# Patient Record
Sex: Female | Born: 1955 | Race: Black or African American | Hispanic: No | Marital: Single | Smoking: Never smoker
Health system: Southern US, Community
[De-identification: ages and names within clinical notes are randomized; demographics above are authoritative.]

## PROBLEM LIST (undated history)

## (undated) DIAGNOSIS — D219 Benign neoplasm of connective and other soft tissue, unspecified: Secondary | ICD-10-CM

## (undated) DIAGNOSIS — R161 Splenomegaly, not elsewhere classified: Secondary | ICD-10-CM

## (undated) DIAGNOSIS — E119 Type 2 diabetes mellitus without complications: Secondary | ICD-10-CM

## (undated) DIAGNOSIS — G459 Transient cerebral ischemic attack, unspecified: Secondary | ICD-10-CM

## (undated) DIAGNOSIS — R7611 Nonspecific reaction to tuberculin skin test without active tuberculosis: Secondary | ICD-10-CM

## (undated) DIAGNOSIS — H3581 Retinal edema: Secondary | ICD-10-CM

## (undated) DIAGNOSIS — F329 Major depressive disorder, single episode, unspecified: Secondary | ICD-10-CM

## (undated) DIAGNOSIS — G629 Polyneuropathy, unspecified: Secondary | ICD-10-CM

## (undated) DIAGNOSIS — E669 Obesity, unspecified: Secondary | ICD-10-CM

## (undated) DIAGNOSIS — N946 Dysmenorrhea, unspecified: Secondary | ICD-10-CM

## (undated) DIAGNOSIS — G4730 Sleep apnea, unspecified: Secondary | ICD-10-CM

## (undated) DIAGNOSIS — Z9289 Personal history of other medical treatment: Secondary | ICD-10-CM

## (undated) DIAGNOSIS — N879 Dysplasia of cervix uteri, unspecified: Secondary | ICD-10-CM

## (undated) DIAGNOSIS — J189 Pneumonia, unspecified organism: Secondary | ICD-10-CM

## (undated) DIAGNOSIS — H269 Unspecified cataract: Secondary | ICD-10-CM

## (undated) DIAGNOSIS — E785 Hyperlipidemia, unspecified: Secondary | ICD-10-CM

## (undated) DIAGNOSIS — M8580 Other specified disorders of bone density and structure, unspecified site: Secondary | ICD-10-CM

## (undated) DIAGNOSIS — I10 Essential (primary) hypertension: Secondary | ICD-10-CM

## (undated) DIAGNOSIS — J45901 Unspecified asthma with (acute) exacerbation: Secondary | ICD-10-CM

## (undated) DIAGNOSIS — I639 Cerebral infarction, unspecified: Secondary | ICD-10-CM

## (undated) HISTORY — PX: BREAST SURGERY: SHX581

## (undated) HISTORY — DX: Obesity, unspecified: E66.9

## (undated) HISTORY — DX: Benign neoplasm of connective and other soft tissue, unspecified: D21.9

## (undated) HISTORY — DX: Transient cerebral ischemic attack, unspecified: G45.9

## (undated) HISTORY — PX: BREAST BIOPSY: SHX20

## (undated) HISTORY — DX: Hyperlipidemia, unspecified: E78.5

## (undated) HISTORY — DX: Retinal edema: H35.81

## (undated) HISTORY — PX: COLPOSCOPY: SHX161

## (undated) HISTORY — DX: Other specified disorders of bone density and structure, unspecified site: M85.80

## (undated) HISTORY — DX: Dysmenorrhea, unspecified: N94.6

## (undated) HISTORY — PX: STOMACH SURGERY: SHX791

## (undated) HISTORY — DX: Polyneuropathy, unspecified: G62.9

## (undated) HISTORY — PX: EYE SURGERY: SHX253

## (undated) HISTORY — PX: PELVIC LAPAROSCOPY: SHX162

## (undated) HISTORY — PX: OVARIAN CYST REMOVAL: SHX89

## (undated) HISTORY — DX: Essential (primary) hypertension: I10

## (undated) HISTORY — DX: Major depressive disorder, single episode, unspecified: F32.9

## (undated) HISTORY — DX: Unspecified asthma with (acute) exacerbation: J45.901

## (undated) HISTORY — DX: Splenomegaly, not elsewhere classified: R16.1

## (undated) HISTORY — DX: Type 2 diabetes mellitus without complications: E11.9

## (undated) HISTORY — DX: Dysplasia of cervix uteri, unspecified: N87.9

## (undated) HISTORY — PX: APPENDECTOMY: SHX54

## (undated) HISTORY — PX: MYOMECTOMY: SHX85

## (undated) HISTORY — PX: GYNECOLOGIC CRYOSURGERY: SHX857

## (undated) HISTORY — PX: COLONOSCOPY: SHX174

---
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
## 1998-12-08 ENCOUNTER — Other Ambulatory Visit: Admission: RE | Admit: 1998-12-08 | Discharge: 1998-12-08 | Payer: Self-pay | Admitting: Obstetrics and Gynecology

## 1999-08-10 ENCOUNTER — Encounter: Payer: Self-pay | Admitting: Obstetrics and Gynecology

## 1999-08-10 ENCOUNTER — Encounter: Admission: RE | Admit: 1999-08-10 | Discharge: 1999-08-10 | Payer: Self-pay | Admitting: Obstetrics and Gynecology

## 2000-01-29 ENCOUNTER — Other Ambulatory Visit: Admission: RE | Admit: 2000-01-29 | Discharge: 2000-01-29 | Payer: Self-pay | Admitting: Obstetrics and Gynecology

## 2000-05-02 ENCOUNTER — Ambulatory Visit (HOSPITAL_BASED_OUTPATIENT_CLINIC_OR_DEPARTMENT_OTHER): Admission: RE | Admit: 2000-05-02 | Discharge: 2000-05-02 | Payer: Self-pay | Admitting: Orthopedic Surgery

## 2000-08-22 ENCOUNTER — Emergency Department (HOSPITAL_COMMUNITY): Admission: EM | Admit: 2000-08-22 | Discharge: 2000-08-23 | Payer: Self-pay | Admitting: Emergency Medicine

## 2000-08-22 ENCOUNTER — Encounter: Payer: Self-pay | Admitting: Emergency Medicine

## 2000-09-04 ENCOUNTER — Encounter: Payer: Self-pay | Admitting: Internal Medicine

## 2000-09-04 ENCOUNTER — Encounter: Admission: RE | Admit: 2000-09-04 | Discharge: 2000-09-04 | Payer: Self-pay | Admitting: Internal Medicine

## 2001-02-18 HISTORY — PX: OTHER SURGICAL HISTORY: SHX169

## 2001-02-20 ENCOUNTER — Other Ambulatory Visit: Admission: RE | Admit: 2001-02-20 | Discharge: 2001-02-20 | Payer: Self-pay | Admitting: Obstetrics and Gynecology

## 2001-03-05 ENCOUNTER — Encounter: Admission: RE | Admit: 2001-03-05 | Discharge: 2001-03-05 | Payer: Self-pay | Admitting: Internal Medicine

## 2001-03-05 ENCOUNTER — Encounter: Payer: Self-pay | Admitting: Internal Medicine

## 2001-03-17 ENCOUNTER — Encounter: Admission: RE | Admit: 2001-03-17 | Discharge: 2001-06-15 | Payer: Self-pay | Admitting: Internal Medicine

## 2002-03-30 ENCOUNTER — Other Ambulatory Visit: Admission: RE | Admit: 2002-03-30 | Discharge: 2002-03-30 | Payer: Self-pay | Admitting: Obstetrics and Gynecology

## 2003-05-28 ENCOUNTER — Other Ambulatory Visit: Admission: RE | Admit: 2003-05-28 | Discharge: 2003-05-28 | Payer: Self-pay | Admitting: Obstetrics and Gynecology

## 2003-09-02 ENCOUNTER — Ambulatory Visit: Admission: RE | Admit: 2003-09-02 | Discharge: 2003-09-02 | Payer: Self-pay | Admitting: Geriatric Medicine

## 2003-09-20 ENCOUNTER — Encounter: Admission: RE | Admit: 2003-09-20 | Discharge: 2003-12-19 | Payer: Self-pay | Admitting: Internal Medicine

## 2004-04-11 ENCOUNTER — Ambulatory Visit: Payer: Self-pay | Admitting: Internal Medicine

## 2004-05-10 ENCOUNTER — Ambulatory Visit: Payer: Self-pay | Admitting: Internal Medicine

## 2004-08-15 ENCOUNTER — Other Ambulatory Visit: Admission: RE | Admit: 2004-08-15 | Discharge: 2004-08-15 | Payer: Self-pay | Admitting: Obstetrics and Gynecology

## 2005-01-08 ENCOUNTER — Ambulatory Visit: Payer: Self-pay | Admitting: Internal Medicine

## 2005-05-30 ENCOUNTER — Ambulatory Visit: Payer: Self-pay | Admitting: Internal Medicine

## 2005-06-20 ENCOUNTER — Encounter: Admission: RE | Admit: 2005-06-20 | Discharge: 2005-09-18 | Payer: Self-pay | Admitting: Internal Medicine

## 2005-07-02 ENCOUNTER — Ambulatory Visit: Payer: Self-pay | Admitting: Internal Medicine

## 2005-07-25 ENCOUNTER — Ambulatory Visit: Payer: Self-pay | Admitting: Internal Medicine

## 2005-08-13 ENCOUNTER — Emergency Department (HOSPITAL_COMMUNITY): Admission: EM | Admit: 2005-08-13 | Discharge: 2005-08-13 | Payer: Self-pay | Admitting: Emergency Medicine

## 2006-01-24 ENCOUNTER — Ambulatory Visit: Payer: Self-pay | Admitting: Internal Medicine

## 2006-03-05 ENCOUNTER — Ambulatory Visit: Payer: Self-pay | Admitting: Internal Medicine

## 2006-03-12 ENCOUNTER — Ambulatory Visit: Payer: Self-pay | Admitting: Internal Medicine

## 2006-05-29 ENCOUNTER — Other Ambulatory Visit: Admission: RE | Admit: 2006-05-29 | Discharge: 2006-05-29 | Payer: Self-pay | Admitting: Obstetrics and Gynecology

## 2006-07-07 DIAGNOSIS — I10 Essential (primary) hypertension: Secondary | ICD-10-CM | POA: Insufficient documentation

## 2006-07-07 DIAGNOSIS — I152 Hypertension secondary to endocrine disorders: Secondary | ICD-10-CM | POA: Insufficient documentation

## 2006-07-07 HISTORY — DX: Essential (primary) hypertension: I10

## 2006-10-09 ENCOUNTER — Ambulatory Visit: Payer: Self-pay | Admitting: Internal Medicine

## 2006-10-11 LAB — CONVERTED CEMR LAB
AST: 14 units/L (ref 0–37)
BUN: 8 mg/dL (ref 6–23)
Cholesterol: 239 mg/dL (ref 0–200)
Hgb A1c MFr Bld: 9.3 % — ABNORMAL HIGH (ref 4.6–6.0)
Microalb Creat Ratio: 34.7 mg/g — ABNORMAL HIGH (ref 0.0–30.0)
Total CHOL/HDL Ratio: 4.7

## 2006-11-27 ENCOUNTER — Ambulatory Visit: Payer: Self-pay | Admitting: Endocrinology

## 2006-12-01 DIAGNOSIS — E1169 Type 2 diabetes mellitus with other specified complication: Secondary | ICD-10-CM | POA: Insufficient documentation

## 2006-12-01 DIAGNOSIS — E785 Hyperlipidemia, unspecified: Secondary | ICD-10-CM

## 2006-12-01 DIAGNOSIS — E119 Type 2 diabetes mellitus without complications: Secondary | ICD-10-CM

## 2006-12-01 HISTORY — DX: Type 2 diabetes mellitus without complications: E11.9

## 2006-12-01 HISTORY — DX: Hyperlipidemia, unspecified: E78.5

## 2006-12-12 ENCOUNTER — Ambulatory Visit: Payer: Self-pay | Admitting: Endocrinology

## 2007-01-09 ENCOUNTER — Ambulatory Visit: Payer: Self-pay | Admitting: Endocrinology

## 2007-01-22 ENCOUNTER — Telehealth (INDEPENDENT_AMBULATORY_CARE_PROVIDER_SITE_OTHER): Payer: Self-pay | Admitting: *Deleted

## 2007-01-23 ENCOUNTER — Ambulatory Visit: Payer: Self-pay | Admitting: Internal Medicine

## 2007-01-23 DIAGNOSIS — R079 Chest pain, unspecified: Secondary | ICD-10-CM | POA: Insufficient documentation

## 2007-01-23 DIAGNOSIS — R1011 Right upper quadrant pain: Secondary | ICD-10-CM | POA: Insufficient documentation

## 2007-02-04 ENCOUNTER — Encounter: Admission: RE | Admit: 2007-02-04 | Discharge: 2007-02-04 | Payer: Self-pay | Admitting: Internal Medicine

## 2007-02-06 ENCOUNTER — Encounter (INDEPENDENT_AMBULATORY_CARE_PROVIDER_SITE_OTHER): Payer: Self-pay | Admitting: *Deleted

## 2007-02-07 ENCOUNTER — Ambulatory Visit: Payer: Self-pay | Admitting: Cardiology

## 2007-02-21 ENCOUNTER — Ambulatory Visit: Payer: Self-pay

## 2007-02-21 ENCOUNTER — Encounter: Payer: Self-pay | Admitting: Internal Medicine

## 2007-02-24 ENCOUNTER — Ambulatory Visit: Payer: Self-pay

## 2007-02-26 ENCOUNTER — Encounter: Payer: Self-pay | Admitting: *Deleted

## 2007-02-27 ENCOUNTER — Encounter: Payer: Self-pay | Admitting: Endocrinology

## 2007-02-27 ENCOUNTER — Ambulatory Visit: Payer: Self-pay | Admitting: Endocrinology

## 2007-03-07 ENCOUNTER — Ambulatory Visit (HOSPITAL_BASED_OUTPATIENT_CLINIC_OR_DEPARTMENT_OTHER): Admission: RE | Admit: 2007-03-07 | Discharge: 2007-03-07 | Payer: Self-pay | Admitting: Obstetrics and Gynecology

## 2007-03-07 ENCOUNTER — Encounter: Payer: Self-pay | Admitting: Obstetrics and Gynecology

## 2007-03-13 ENCOUNTER — Telehealth (INDEPENDENT_AMBULATORY_CARE_PROVIDER_SITE_OTHER): Payer: Self-pay | Admitting: *Deleted

## 2007-03-14 ENCOUNTER — Encounter: Payer: Self-pay | Admitting: Endocrinology

## 2007-03-14 ENCOUNTER — Ambulatory Visit: Payer: Self-pay | Admitting: Endocrinology

## 2007-03-16 DIAGNOSIS — F329 Major depressive disorder, single episode, unspecified: Secondary | ICD-10-CM

## 2007-03-16 DIAGNOSIS — F3289 Other specified depressive episodes: Secondary | ICD-10-CM

## 2007-03-16 HISTORY — DX: Other specified depressive episodes: F32.89

## 2007-03-16 HISTORY — DX: Major depressive disorder, single episode, unspecified: F32.9

## 2007-03-21 ENCOUNTER — Ambulatory Visit: Payer: Self-pay | Admitting: Endocrinology

## 2007-04-03 ENCOUNTER — Telehealth (INDEPENDENT_AMBULATORY_CARE_PROVIDER_SITE_OTHER): Payer: Self-pay | Admitting: *Deleted

## 2007-04-03 ENCOUNTER — Emergency Department (HOSPITAL_COMMUNITY): Admission: EM | Admit: 2007-04-03 | Discharge: 2007-04-03 | Payer: Self-pay | Admitting: Emergency Medicine

## 2007-06-17 ENCOUNTER — Ambulatory Visit: Payer: Self-pay | Admitting: Internal Medicine

## 2007-06-22 LAB — CONVERTED CEMR LAB
BUN: 7 mg/dL (ref 6–23)
Hgb A1c MFr Bld: 9.7 % — ABNORMAL HIGH (ref 4.6–6.0)
Potassium: 4.7 meq/L (ref 3.5–5.1)

## 2007-06-23 ENCOUNTER — Encounter (INDEPENDENT_AMBULATORY_CARE_PROVIDER_SITE_OTHER): Payer: Self-pay | Admitting: *Deleted

## 2007-07-15 ENCOUNTER — Ambulatory Visit: Payer: Self-pay | Admitting: Endocrinology

## 2007-07-15 ENCOUNTER — Encounter: Payer: Self-pay | Admitting: Endocrinology

## 2007-07-15 DIAGNOSIS — R059 Cough, unspecified: Secondary | ICD-10-CM | POA: Insufficient documentation

## 2007-07-15 DIAGNOSIS — R05 Cough: Secondary | ICD-10-CM | POA: Insufficient documentation

## 2007-07-29 ENCOUNTER — Ambulatory Visit: Payer: Self-pay | Admitting: Endocrinology

## 2007-08-26 ENCOUNTER — Ambulatory Visit: Payer: Self-pay | Admitting: Endocrinology

## 2007-09-22 ENCOUNTER — Encounter: Payer: Self-pay | Admitting: Endocrinology

## 2007-09-23 ENCOUNTER — Telehealth: Payer: Self-pay | Admitting: Endocrinology

## 2007-10-21 ENCOUNTER — Telehealth: Payer: Self-pay | Admitting: Endocrinology

## 2007-11-26 ENCOUNTER — Ambulatory Visit: Payer: Self-pay | Admitting: Endocrinology

## 2007-11-27 LAB — CONVERTED CEMR LAB: Hgb A1c MFr Bld: 9.8 % — ABNORMAL HIGH (ref 4.6–6.0)

## 2007-11-28 ENCOUNTER — Telehealth: Payer: Self-pay | Admitting: Endocrinology

## 2007-12-01 ENCOUNTER — Ambulatory Visit: Payer: Self-pay | Admitting: Endocrinology

## 2008-01-12 ENCOUNTER — Ambulatory Visit: Payer: Self-pay | Admitting: Gastroenterology

## 2008-01-20 ENCOUNTER — Encounter: Payer: Self-pay | Admitting: Endocrinology

## 2008-01-22 ENCOUNTER — Telehealth: Payer: Self-pay | Admitting: Gastroenterology

## 2008-01-23 ENCOUNTER — Encounter: Payer: Self-pay | Admitting: Gastroenterology

## 2008-01-23 ENCOUNTER — Ambulatory Visit: Payer: Self-pay | Admitting: Gastroenterology

## 2008-01-28 ENCOUNTER — Encounter: Payer: Self-pay | Admitting: Gastroenterology

## 2008-02-12 ENCOUNTER — Ambulatory Visit: Payer: Self-pay | Admitting: Endocrinology

## 2008-05-21 DIAGNOSIS — H269 Unspecified cataract: Secondary | ICD-10-CM

## 2008-05-21 HISTORY — DX: Unspecified cataract: H26.9

## 2008-06-17 ENCOUNTER — Encounter: Payer: Self-pay | Admitting: Internal Medicine

## 2008-07-12 ENCOUNTER — Ambulatory Visit: Payer: Self-pay | Admitting: Obstetrics and Gynecology

## 2008-07-12 ENCOUNTER — Other Ambulatory Visit: Admission: RE | Admit: 2008-07-12 | Discharge: 2008-07-12 | Payer: Self-pay | Admitting: Obstetrics and Gynecology

## 2008-07-12 ENCOUNTER — Encounter: Payer: Self-pay | Admitting: Obstetrics and Gynecology

## 2008-07-22 ENCOUNTER — Ambulatory Visit: Payer: Self-pay | Admitting: Internal Medicine

## 2008-07-22 DIAGNOSIS — IMO0001 Reserved for inherently not codable concepts without codable children: Secondary | ICD-10-CM | POA: Insufficient documentation

## 2008-07-24 LAB — CONVERTED CEMR LAB
BUN: 14 mg/dL (ref 6–23)
Creatinine, Ser: 0.7 mg/dL (ref 0.4–1.2)
Hgb A1c MFr Bld: 10.3 % — ABNORMAL HIGH (ref 4.6–6.0)
Saturation Ratios: 13.7 % — ABNORMAL LOW (ref 20.0–50.0)

## 2008-07-27 ENCOUNTER — Telehealth (INDEPENDENT_AMBULATORY_CARE_PROVIDER_SITE_OTHER): Payer: Self-pay | Admitting: *Deleted

## 2008-07-28 ENCOUNTER — Encounter (INDEPENDENT_AMBULATORY_CARE_PROVIDER_SITE_OTHER): Payer: Self-pay | Admitting: *Deleted

## 2008-07-28 ENCOUNTER — Telehealth (INDEPENDENT_AMBULATORY_CARE_PROVIDER_SITE_OTHER): Payer: Self-pay | Admitting: *Deleted

## 2008-07-29 ENCOUNTER — Encounter: Payer: Self-pay | Admitting: Internal Medicine

## 2008-08-02 ENCOUNTER — Ambulatory Visit: Payer: Self-pay | Admitting: Internal Medicine

## 2008-08-02 ENCOUNTER — Ambulatory Visit: Payer: Self-pay | Admitting: Endocrinology

## 2008-08-03 ENCOUNTER — Telehealth (INDEPENDENT_AMBULATORY_CARE_PROVIDER_SITE_OTHER): Payer: Self-pay | Admitting: *Deleted

## 2008-08-30 ENCOUNTER — Ambulatory Visit: Payer: Self-pay | Admitting: Endocrinology

## 2008-11-01 ENCOUNTER — Telehealth: Payer: Self-pay | Admitting: Endocrinology

## 2009-03-22 ENCOUNTER — Telehealth: Payer: Self-pay | Admitting: Endocrinology

## 2009-04-04 ENCOUNTER — Ambulatory Visit: Payer: Self-pay | Admitting: Internal Medicine

## 2009-04-04 DIAGNOSIS — M79609 Pain in unspecified limb: Secondary | ICD-10-CM | POA: Insufficient documentation

## 2009-04-06 ENCOUNTER — Encounter (INDEPENDENT_AMBULATORY_CARE_PROVIDER_SITE_OTHER): Payer: Self-pay | Admitting: *Deleted

## 2009-06-20 ENCOUNTER — Encounter: Payer: Self-pay | Admitting: Internal Medicine

## 2009-09-15 ENCOUNTER — Ambulatory Visit: Payer: Self-pay | Admitting: Endocrinology

## 2009-09-15 DIAGNOSIS — R809 Proteinuria, unspecified: Secondary | ICD-10-CM | POA: Insufficient documentation

## 2009-09-15 LAB — CONVERTED CEMR LAB
ALT: 26 units/L (ref 0–35)
Albumin: 3.6 g/dL (ref 3.5–5.2)
Alkaline Phosphatase: 66 units/L (ref 39–117)
Basophils Relative: 0.7 % (ref 0.0–3.0)
CO2: 30 meq/L (ref 19–32)
Calcium: 9.3 mg/dL (ref 8.4–10.5)
Chloride: 103 meq/L (ref 96–112)
Eosinophils Absolute: 0.3 10*3/uL (ref 0.0–0.7)
Eosinophils Relative: 3.1 % (ref 0.0–5.0)
Hemoglobin: 14 g/dL (ref 12.0–15.0)
Lymphocytes Relative: 30 % (ref 12.0–46.0)
MCHC: 33.7 g/dL (ref 30.0–36.0)
MCV: 83.2 fL (ref 78.0–100.0)
Microalb, Ur: 44.2 mg/dL — ABNORMAL HIGH (ref 0.0–1.9)
Neutro Abs: 6.3 10*3/uL (ref 1.4–7.7)
RBC: 4.98 M/uL (ref 3.87–5.11)
Sodium: 142 meq/L (ref 135–145)
Specific Gravity, Urine: >=1.03 (ref 1.000–1.030)
Total CHOL/HDL Ratio: 4
Total Protein, Urine: 100 mg/dL
Total Protein: 6.6 g/dL (ref 6.0–8.3)
Urine Glucose: 100 mg/dL
pH: 5.5 (ref 5.0–8.0)

## 2009-09-23 ENCOUNTER — Ambulatory Visit: Payer: Self-pay | Admitting: Internal Medicine

## 2009-12-22 ENCOUNTER — Ambulatory Visit: Payer: Self-pay | Admitting: Endocrinology

## 2009-12-29 ENCOUNTER — Ambulatory Visit: Payer: Self-pay | Admitting: Obstetrics and Gynecology

## 2009-12-29 ENCOUNTER — Other Ambulatory Visit: Admission: RE | Admit: 2009-12-29 | Discharge: 2009-12-29 | Payer: Self-pay | Admitting: Obstetrics and Gynecology

## 2010-01-31 ENCOUNTER — Ambulatory Visit: Payer: Self-pay | Admitting: Internal Medicine

## 2010-02-10 ENCOUNTER — Ambulatory Visit: Payer: Self-pay | Admitting: Endocrinology

## 2010-03-15 ENCOUNTER — Ambulatory Visit: Payer: Self-pay | Admitting: Obstetrics and Gynecology

## 2010-03-21 ENCOUNTER — Ambulatory Visit: Payer: Self-pay | Admitting: Endocrinology

## 2010-04-18 ENCOUNTER — Telehealth: Payer: Self-pay | Admitting: Endocrinology

## 2010-05-02 ENCOUNTER — Telehealth: Payer: Self-pay | Admitting: Endocrinology

## 2010-05-02 ENCOUNTER — Ambulatory Visit: Payer: Self-pay | Admitting: Endocrinology

## 2010-05-21 DIAGNOSIS — G459 Transient cerebral ischemic attack, unspecified: Secondary | ICD-10-CM

## 2010-05-21 DIAGNOSIS — I639 Cerebral infarction, unspecified: Secondary | ICD-10-CM

## 2010-05-21 HISTORY — DX: Cerebral infarction, unspecified: I63.9

## 2010-05-21 HISTORY — PX: OTHER SURGICAL HISTORY: SHX169

## 2010-05-21 HISTORY — DX: Transient cerebral ischemic attack, unspecified: G45.9

## 2010-05-30 ENCOUNTER — Other Ambulatory Visit: Payer: Self-pay | Admitting: Endocrinology

## 2010-05-30 ENCOUNTER — Ambulatory Visit
Admission: RE | Admit: 2010-05-30 | Discharge: 2010-05-30 | Payer: Self-pay | Source: Home / Self Care | Attending: Endocrinology | Admitting: Endocrinology

## 2010-05-30 LAB — HEMOGLOBIN A1C: Hgb A1c MFr Bld: 10 % — ABNORMAL HIGH (ref 4.6–6.5)

## 2010-06-09 ENCOUNTER — Encounter: Payer: Self-pay | Admitting: Internal Medicine

## 2010-06-20 NOTE — Assessment & Plan Note (Signed)
Summary: FOR ELEV- CHOLESTROL--PH   Vital Signs:  Patient profile:   55 year old female Weight:      245 pounds BMI:     39.69 Pulse rate:   94 / minute Resp:     17 per minute BP sitting:   140 / 88  (left arm) Cuff size:   large  Vitals Entered By: Shonna Chock (Sep 23, 2009 3:09 PM) CC: Follow-up visit: Discuss Chlosterol (copy of labs given) Comments REVIEWED MED LIST, PATIENT AGREED DOSE AND INSTRUCTION CORRECT    Primary Care Provider:  Marga Melnick, M.d.  CC:  Follow-up visit: Discuss Chlosterol (copy of labs given).  History of Present Illness: FBS 130-326; no 2 hr post meal glucoses. No hypoglycemia; weight stable. No diet;CVE as walking 2X/ week for 45 min.Labs reviewed & risks discussed. Insulin 200 units in am & 25 @ night.  Allergies (verified): No Known Drug Allergies  Review of Systems General:  Complains of fatigue. Eyes:  Last exam 07/2009 ; no retinopathy. CV:  Denies chest pain or discomfort, lightheadness, and near fainting. Derm:  Complains of poor wound healing. Neuro:  Complains of numbness; denies tingling; Numbness  in digits. Endo:  Complains of excessive urination; denies excessive hunger and excessive thirst.  Physical Exam  General:  in no acute distress; alert,appropriate and cooperative throughout examination;underweight appearing.   Heart:  Normal rate and regular rhythm. S1 and S2 normal without gallop, murmur, click, rub .S4 Pulses:  R and L carotid,radial,dorsalis pedis and posterior tibial pulses are full and equal bilaterally Extremities:  No clubbing, cyanosis, edema. Skin:  Healing granuloma L neck   Impression & Recommendations:  Problem # 1:  DIABETES MELLITUS, TYPE II, UNCONTROLLED (ICD-250.02)  Her updated medication list for this problem includes:    Quinapril Hcl 40 Mg Tabs (Quinapril hcl) .Marland Kitchen... 1 qd    Humalog Mix 75/25 75-25 % Susp (Insulin lispro prot & lispro) .Marland Kitchen... 200 units am and 25 units with the evening  meal  Problem # 2:  HYPERLIPIDEMIA (ICD-272.4)  Complete Medication List: 1)  Norvasc 10 Mg Tabs (Amlodipine besylate) .Marland Kitchen.. 1 by mouth qd 2)  Spironolactone 25 Mg Tabs (Spironolactone) .Marland Kitchen.. 1 by mouth bid 3)  Singulair 10 Mg Tabs (Montelukast sodium) .Marland Kitchen.. 1 by mouth qd 4)  Ventolin Hfa 108 (90 Base) Mcg/act Aers (Albuterol sulfate) .Marland Kitchen.. 1-2 sprays q4 hours as needed for sob 5)  Symbicort 160-4.5 Mcg/act Aero (Budesonide-formoterol fumarate) .... 2 puffs two times a day in place of advair 6)  Quinapril Hcl 40 Mg Tabs (Quinapril hcl) .Marland Kitchen.. 1 qd 7)  Folic Acid 1 Mg Tabs (Folic acid) .Marland Kitchen.. 1 once daily 8)  Humalog Mix 75/25 75-25 % Susp (Insulin lispro prot & lispro) .... 200 units am and 25 units with the evening meal 9)  Onetouch Ultra Test Strp (Glucose blood) .... Use as directed 10)  Insulin Syrg 64ml/ 31 Mis Bd C  11)  Metoprolol Succinate 200 Mg Xr24h-tab (Metoprolol succinate) .Marland Kitchen.. 1 tab once daily  Patient Instructions: 1)  Follow the "40,35, 30 & 25 Guidelines " as discussed. 2)  Please schedule a follow-up appointment in 4 months. 3)  Lipid Panel prior to visit, ICD-9:272.4  4)  HbgA1C prior to visit, ICD-9:250.02 5)  Urine Microalbumin prior to visit, ICD-9:250.02 Prescriptions: SINGULAIR 10 MG  TABS (MONTELUKAST SODIUM) 1 by mouth qd  #30 Tablet x 11   Entered and Authorized by:   Marga Melnick MD   Signed by:  Marga Melnick MD on 09/23/2009   Method used:   Print then Give to Patient   RxID:   [id number]

## 2010-06-20 NOTE — Assessment & Plan Note (Signed)
Summary: rto 4 months/cbs   Vital Signs:  Patient profile:   55 year old female Height:      66 inches Weight:      238 pounds Temp:     98.2 degrees F oral Pulse rate:   82 / minute Resp:     17 per minute BP sitting:   140 / 110  (left arm)  Vitals Entered By: Jeremy Johann CMA (January 31, 2010 4:27 PM) CC: 4 month f/u, not fasting, Type 2 diabetes mellitus follow-up   Primary Care Provider:  Marga Melnick, M.d.  CC:  4 month f/u, not fasting, and Type 2 diabetes mellitus follow-up.  History of Present Illness: Type 2 Diabetes Mellitus Follow-Up      This is a 55 year old woman who presents for Type 2 diabetes mellitus follow-up.  The patient reports polyuria in context of recurrent UTIs, but denies polydipsia, blurred vision, self managed hypoglycemia, weight loss, weight gain, and numbness of extremities.  The patient denies the following symptoms: neuropathic pain, chest pain, vomiting, orthostatic symptoms, poor wound healing, intermittent claudication, vision loss, and foot ulcer.  Since the last visit the patient reports fair dietary compliance, not exercising regularly, and not monitoring blood glucose daily.  The patient has been measuring capillary blood glucose before breakfast 2-3 X /week. Range 128-341. Since the last visit, the patient reports having had no eye care and no foot care.  Role of HFCS in raising TG  & causing uncontrolled DM discussed.Risk of A1c of 10.4% discussed( average sugar 252 ; risk 108% increased). She is very vague & non committal in her responses as to compliance with recommendations about nutrition changes we have repeatedly discussed. It is noted her weight is down 7# since last visit despite being on insulin. Because of insulin, Victoza is not an option. I discussed with her this "[employer] Indifference" affect   Current Medications (verified): 1)  Norvasc 10 Mg  Tabs (Amlodipine Besylate) .Marland Kitchen.. 1 By Mouth Qd 2)  Spironolactone 25 Mg  Tabs  (Spironolactone) .Marland Kitchen.. 1 By Mouth Bid 3)  Singulair 10 Mg  Tabs (Montelukast Sodium) .Marland Kitchen.. 1 By Mouth Qd 4)  Ventolin Hfa 108 (90 Base) Mcg/act  Aers (Albuterol Sulfate) .Marland Kitchen.. 1-2 Sprays Q4 Hours As Needed For Sob 5)  Symbicort 160-4.5 Mcg/act  Aero (Budesonide-Formoterol Fumarate) .... 2 Puffs Two Times A Day in Place of Advair 6)  Quinapril Hcl 40 Mg  Tabs (Quinapril Hcl) .Marland Kitchen.. 1 Qd 7)  Folic Acid 1 Mg Tabs (Folic Acid) .Marland Kitchen.. 1 Once Daily 8)  Humalog Mix 75/25 75-25 % Susp (Insulin Lispro Prot & Lispro) .... 200 Units Am and 25 Units With The Evening Meal 9)  Onetouch Ultra Test  Strp (Glucose Blood) .... Use As Directed 10)  Insulin Syrg 48ml/ 31 Mis Bd C 11)  Metoprolol Succinate 200 Mg Xr24h-Tab (Metoprolol Succinate) .Marland Kitchen.. 1 Tab Once Daily  Allergies (verified): No Known Drug Allergies  Physical Exam  General:  in no acute distress; alert,appropriate and cooperative throughout examination Lungs:  Normal respiratory effort, chest expands symmetrically. Lungs are clear to auscultation, no crackles or wheezes. Heart:  Normal rate and regular rhythm. S1 and S2 normal without gallop, murmur, click, rub .S4 Pulses:  R and L carotid,radial,dorsalis pedis and posterior tibial pulses are full and equal bilaterally Extremities:  No clubbing, cyanosis, edema. Neurologic:  sensation intact to light touch.   Skin:  Intact without suspicious lesions or rashes Psych:  flat affect and subdued. "[place]" clinically    Impression & Recommendations:  Problem # 1:  DIABETES MELLITUS, TYPE II, UNCONTROLLED (ICD-250.02) Non adherence suggested Her updated medication list for this problem includes:    Quinapril Hcl 40 Mg Tabs (Quinapril hcl) .Marland Kitchen... 1 qd    Humalog Mix 75/25 75-25 % Susp (Insulin lispro prot & lispro) .Marland Kitchen... 200 units am and 25 units with the evening meal  Problem # 2:  HYPERTENSION (ICD-401.9)  Her updated medication list for this problem includes:    Norvasc 10 Mg Tabs  (Amlodipine besylate) .Marland Kitchen... 1 by mouth qd    Spironolactone 25 Mg Tabs (Spironolactone) .Marland Kitchen... 1 by mouth bid    Quinapril Hcl 40 Mg Tabs (Quinapril hcl) .Marland Kitchen... 1 qd    Metoprolol Succinate 200 Mg Xr24h-tab (Metoprolol succinate) .Marland Kitchen... 1 tab once daily  Problem # 3:  HYPERLIPIDEMIA (ICD-272.4)  Complete Medication List: 1)  Norvasc 10 Mg Tabs (Amlodipine besylate) .Marland Kitchen.. 1 by mouth qd 2)  Spironolactone 25 Mg Tabs (Spironolactone) .Marland Kitchen.. 1 by mouth bid 3)  Singulair 10 Mg Tabs (Montelukast sodium) .Marland Kitchen.. 1 by mouth qd 4)  Ventolin Hfa 108 (90 Base) Mcg/act Aers (Albuterol sulfate) .Marland Kitchen.. 1-2 sprays q4 hours as needed for sob 5)  Symbicort 160-4.5 Mcg/act Aero (Budesonide-formoterol fumarate) .... 2 puffs two times a day in place of advair 6)  Quinapril Hcl 40 Mg Tabs (Quinapril hcl) .Marland Kitchen.. 1 qd 7)  Folic Acid 1 Mg Tabs (Folic acid) .Marland Kitchen.. 1 once daily 8)  Humalog Mix 75/25 75-25 % Susp (Insulin lispro prot & lispro) .... 200 units am and 25 units with the evening meal 9)  Onetouch Ultra Test Strp (Glucose blood) .... Use as directed 10)  Insulin Syrg 19ml/ 31 Mis Bd C  11)  Metoprolol Succinate 200 Mg Xr24h-tab (Metoprolol succinate) .Marland Kitchen.. 1 tab once daily  Patient Instructions: 1)  Consume LESS THAN 30 grams of High Fructose Corn Syrup sugar / day. 2)  Please schedule a follow-up appointment in 3 months. 3)  BUN,creat,K+ prior to visit, ICD-9:401.9 4)  Lipid Panel prior to visit, ICD-9:272.4 5)  HbgA1C prior to visit, ICD-9:250.02 6)  Urine Microalbumin prior to visit, ICD-9:250.02. 7)  Limit your Sodium (Salt) to less than 4 grams a day (slightly less than 1 teaspoon) to prevent fluid retention, swelling, or worsening or symptoms. 8)  Check your feet each night for sore areas, calluses or signs of infection. 9)  Check your Blood Pressure regularly. Your BP goal = < 140/90 ON AVERAGE Prescriptions: SPIRONOLACTONE 25 MG  TABS (SPIRONOLACTONE) 1 by mouth bid  #60 Tablet x 5   Entered and Authorized  by:   Marga Melnick MD   Signed by:   Marga Melnick MD on 01/31/2010   Method used:   Print then Give to Patient   RxID:   [id number] METOPROLOL SUCCINATE 200 MG XR24H-TAB (METOPROLOL SUCCINATE) 1 tab once daily  #30 x 5   Entered and Authorized by:   Marga Melnick MD   Signed by:   Marga Melnick MD on 01/31/2010   Method used:   Print then Give to Patient   RxID:   [id number] NORVASC 10 MG  TABS (AMLODIPINE BESYLATE) 1 by mouth qd  #30 Tablet x 5   Entered and Authorized by:   Marga Melnick MD   Signed by:   Marga Melnick MD on 01/31/2010   Method used:   Print then Give to Patient   RxID:   [id number] QUINAPRIL HCL 40 MG  TABS (  QUINAPRIL HCL) 1 qd  #30 x 5   Entered and Authorized by:   Marga Melnick MD   Signed by:   Marga Melnick MD on 01/31/2010   Method used:   Print then Give to Patient   RxID:   [id number]

## 2010-06-20 NOTE — Assessment & Plan Note (Signed)
Summary: follow up-a1c-lb   Vital Signs:  Patient profile:   55 year old female Height:      66 inches (167.64 cm) Weight:      243.50 pounds (110.68 kg) O2 Sat:      96 % on Room air Temp:     98.1 degrees F (36.72 degrees C) oral Pulse rate:   110 / minute BP sitting:   132 / 92  (left arm) Cuff size:   large  Vitals Entered By: Josph Macho RMA (September 15, 2009 3:36 PM)  O2 Flow:  Room air CC: Follow-up visit/ pt would like to know if she could use an unrefrigerated insulin/ Pt states she is no longer taking Vitamin D3/ CF Is Patient Diabetic? Yes   Primary Provider:  Marga Melnick, M.d.  CC:  Follow-up visit/ pt would like to know if she could use an unrefrigerated insulin/ Pt states she is no longer taking Vitamin D3/ CF.  History of Present Illness: the status of at least 3 ongoing medical problems is addressed today: dm:  no cbg record, but states cbg's vary from 120-326.  it is in general higher in the am than the pm.  she sometimes takes 225 units due to hyperglycemia.  she says "i sometimes miss the insulin." htn:  pt says she last took toprol-xl this am.  denies dizziness. dyslipidemia:  pt says she has never taken a cholesterol pill.  she says her diet is "ok."  Allergies: No Known Drug Allergies  Past History:  Past Medical History: Last updated: 01/12/2008 Hypertension Diabetes mellitus, type II Hyperlipidemia Depression chest pain pain in joint, unspecified abdominal pain, right upper quadrant obesity  Review of Systems  The patient denies hypoglycemia and syncope.    Physical Exam  General:  normal appearance.   Pulses:  dorsalis pedis intact bilat.  Extremities:  no deformity.  no ulcer on the feet.  feet are of normal color and temp.   1+ right pedal edema and 1+ left pedal edema.   Neurologic:  sensation is intact to touch on the feet  Additional Exam:  Cholesterol LDL       158.8 mg/dL Hemoglobin Z6X       [H]  10.4 %   Impression  & Recommendations:  Problem # 1:  DIABETES MELLITUS, TYPE II, UNCONTROLLED (ICD-250.02) needs increased rx  Problem # 2:  HYPERLIPIDEMIA (ICD-272.4) needs increased rx  Problem # 3:  HYPERTENSION (ICD-401.9) needs increased rx  Medications Added to Medication List This Visit: 1)  Humalog Mix 75/25 75-25 % Susp (Insulin lispro prot & lispro) .... 200 units am and 25 units with the evening meal 2)  Metoprolol Succinate 200 Mg Xr24h-tab (Metoprolol succinate) .Marland Kitchen.. 1 tab once daily  Other Orders: TLB-Lipid Panel (80061-LIPID) TLB-BMP (Basic Metabolic Panel-BMET) (80048-METABOL) TLB-CBC Platelet - w/Differential (85025-CBCD) TLB-Hepatic/Liver Function Pnl (80076-HEPATIC) TLB-TSH (Thyroid Stimulating Hormone) (84443-TSH) TLB-A1C / Hgb A1C (Glycohemoglobin) (83036-A1C) TLB-Microalbumin/Creat Ratio, Urine (82043-MALB) TLB-Udip w/ Micro (81001-URINE) Est. Patient Level IV (09604)  Patient Instructions: 1)  tests are being ordered for you today.  a few days after the test(s), please call [phone] to hear your test results. 2)  pending the test results, please change humalog 75/25 insulin to 200 units am and 25 units pm. 3)  Please schedule a follow-up appointment in 3 months. 4)  check your blood glucose 2 times a day.  vary the time of day between before the 3 meals and at bedtime.  also check if you feel  as though your glucose might be very high or too low.  bring a record of this to your doctor appointments. 5)  please see dr hopper for a regular physical.  i'll drop back and just take care of the diabetes. 6)  increase metoprolol-24 to 200 mg once daily. 7)  good diet and exercise habits significanly improve the control of your diabetes.  please let me know if you wish to be referred to a dietician.  high blood sugar is very risky to your health.  you should see an eye doctor every year. 8)  controlling your blood pressure and cholesterol drastically reduces the damage diabetes does to  your body.  this also applies to quitting smoking.  please discuss these with your doctor.  you should take an aspirin every day, unless you have been advised by a doctor not to. 9)  (update: i left message on phone-tree:  increase insulin as we discussed.  return 2 weeks.  you should take a cholesterol pill). Prescriptions: METOPROLOL SUCCINATE 200 MG XR24H-TAB (METOPROLOL SUCCINATE) 1 tab once daily  #30 x 11   Entered and Authorized by:   Minus Breeding MD   Signed by:   Minus Breeding MD on 09/15/2009   Method used:   Electronically to        [facility]       [address]       [place], [place]  [address]       Ph: [phone]       Fax: [phone]   RxID:   [id number]

## 2010-06-20 NOTE — Assessment & Plan Note (Signed)
Summary: f/u appt/#/cd   Vital Signs:  Patient profile:   55 year old female Height:      66 inches (167.64 cm) Weight:      239.38 pounds (108.81 kg) BMI:     38.78 O2 Sat:      97 % on Room air Temp:     98.5 degrees F (36.94 degrees C) oral Pulse rate:   109 / minute BP sitting:   124 / 80  (left arm) Cuff size:   large  Vitals Entered By: Brenton Grills MA (February 10, 2010 4:12 PM)  O2 Flow:  Room air CC: F/U appt/refill on insulin/pt is no longer taking Folic Acid/aj Is Patient Diabetic? Yes   Primary Provider:  Marga Melnick, M.d.  CC:  F/U appt/refill on insulin/pt is no longer taking Folic Acid/aj.  History of Present Illness: no cbg record, but states cbg's vary from 138-300.  it is in general higher in am than pm.  she says she finds it difficult to remember her insulin.    Current Medications (verified): 1)  Norvasc 10 Mg  Tabs (Amlodipine Besylate) .Marland Kitchen.. 1 By Mouth Qd 2)  Spironolactone 25 Mg  Tabs (Spironolactone) .Marland Kitchen.. 1 By Mouth Bid 3)  Singulair 10 Mg  Tabs (Montelukast Sodium) .Marland Kitchen.. 1 By Mouth Qd 4)  Ventolin Hfa 108 (90 Base) Mcg/act  Aers (Albuterol Sulfate) .Marland Kitchen.. 1-2 Sprays Q4 Hours As Needed For Sob 5)  Symbicort 160-4.5 Mcg/act  Aero (Budesonide-Formoterol Fumarate) .... 2 Puffs Two Times A Day in Place of Advair 6)  Quinapril Hcl 40 Mg  Tabs (Quinapril Hcl) .Marland Kitchen.. 1 Qd 7)  Folic Acid 1 Mg Tabs (Folic Acid) .Marland Kitchen.. 1 Once Daily 8)  Humalog Mix 75/25 75-25 % Susp (Insulin Lispro Prot & Lispro) .... 200 Units Am and 25 Units With The Evening Meal 9)  Onetouch Ultra Test  Strp (Glucose Blood) .... Use As Directed 10)  Insulin Syrg 39ml/ 31 Mis Bd C 11)  Metoprolol Succinate 200 Mg Xr24h-Tab (Metoprolol Succinate) .Marland Kitchen.. 1 Tab Once Daily  Allergies (verified): No Known Drug Allergies  Past History:  Past Medical History: Last updated: 01/12/2008 Hypertension Diabetes mellitus, type II Hyperlipidemia Depression chest pain pain in joint,  unspecified abdominal pain, right upper quadrant obesity  Review of Systems  The patient denies hypoglycemia.    Physical Exam  General:  morbidly obese.  no distress  Pulses:  dorsalis pedis intact bilat.  Extremities:  no deformity.  no ulcer on the feet.  feet are of normal color and temp.   trace right pedal edema and trace left pedal edema.   Neurologic:  sensation is intact to touch on the feet  Additional Exam:  Hemoglobin A1C       [H]  10.0 %        Impression & Recommendations:  Problem # 1:  DIABETES MELLITUS, TYPE II, UNCONTROLLED (ICD-250.02) she needs a simpler regimen  Medications Added to Medication List This Visit: 1)  Levemir 100 Unit/ml Soln (Insulin detemir) .... 250 units each am 2)  Bd Insulin Syringe Ultrafine 31g X 5/16" 1 Ml Misc (Insulin syringe-needle u-100) .Marland Kitchen.. 1 once daily  Other Orders: TLB-A1C / Hgb A1C (Glycohemoglobin) (83036-A1C) Est. Patient Level III (66063)  Patient Instructions: 1)  tests are being ordered for you today.  a few days after the test(s), please call [phone] to hear your test results. 2)  pending the test results, please change insulin to levemir 250 units each am. 3)  Please  schedule a follow-up appointment in  month. 4)  check your blood glucose 2 times a day.  vary the time of day between before the 3 meals and at bedtime.  also check if you feel as though your glucose might be very high or too low.  bring a record of this to your doctor appointments. 5)  (update: i left message on phone-tree:  rx as we discussed) Prescriptions: BD INSULIN SYRINGE ULTRAFINE 31G X 5/16" 1 ML MISC (INSULIN SYRINGE-NEEDLE U-100) 1 once daily  #30 x 11   Entered and Authorized by:   Minus Breeding MD   Signed by:   Minus Breeding MD on 02/10/2010   Method used:   Electronically to        [facility]       [address]       [place], [place]  [address]       Ph: [phone]       Fax:  [phone]   RxID:   [id number] LEVEMIR 100 UNIT/ML SOLN (INSULIN DETEMIR) 250 units each am  #9 vials x 11   Entered and Authorized by:   Minus Breeding MD   Signed by:   Minus Breeding MD on 02/10/2010   Method used:   Electronically to        [facility] (retail)       [address]       [place], [place]  [address]       Ph: [phone]       Fax: [phone]   RxID:   [id number]

## 2010-06-20 NOTE — Assessment & Plan Note (Signed)
Summary: follow up-lb   Vital Signs:  Patient profile:   55 year old female Height:      66 inches (167.64 cm) Weight:      239.25 pounds (108.75 kg) BMI:     38.76 O2 Sat:      97 % on Room air Temp:     98.5 degrees F (36.94 degrees C) oral Pulse rate:   92 / minute BP sitting:   128 / 92  (left arm) Cuff size:   large  Vitals Entered By: Brenton Grills CMA (AAMA) (March 21, 2010 3:30 PM)  O2 Flow:  Room air CC: Follow-up visit/pt is no longer taking Folic Acid/aj Is Patient Diabetic? Yes   Primary Raif Chachere:  Marga Melnick, M.d.  CC:  Follow-up visit/pt is no longer taking Folic Acid/aj.  History of Present Illness: pt says she remembers the once daily insulin much better.  no cbg record, but states cbg's vary from 159-412.  she had 1 episode of lightheadedness in the afternoon.  she did not check cbg then, but it is usually in the 200's in the afternoon.    Current Medications (verified): 1)  Norvasc 10 Mg  Tabs (Amlodipine Besylate) .Marland Kitchen.. 1 By Mouth Qd 2)  Spironolactone 25 Mg  Tabs (Spironolactone) .Marland Kitchen.. 1 By Mouth Bid 3)  Singulair 10 Mg  Tabs (Montelukast Sodium) .Marland Kitchen.. 1 By Mouth Qd 4)  Ventolin Hfa 108 (90 Base) Mcg/act  Aers (Albuterol Sulfate) .Marland Kitchen.. 1-2 Sprays Q4 Hours As Needed For Sob 5)  Symbicort 160-4.5 Mcg/act  Aero (Budesonide-Formoterol Fumarate) .... 2 Puffs Two Times A Day in Place of Advair 6)  Quinapril Hcl 40 Mg  Tabs (Quinapril Hcl) .Marland Kitchen.. 1 Qd 7)  Folic Acid 1 Mg Tabs (Folic Acid) .Marland Kitchen.. 1 Once Daily 8)  Onetouch Ultra Test  Strp (Glucose Blood) .... Use As Directed 9)  Metoprolol Succinate 200 Mg Xr24h-Tab (Metoprolol Succinate) .Marland Kitchen.. 1 Tab Once Daily 10)  Levemir 100 Unit/ml Soln (Insulin Detemir) .... 250 Units Each Am 11)  Bd Insulin Syringe Ultrafine 31g X 5/16" 1 Ml Misc (Insulin Syringe-Needle U-100) .Marland Kitchen.. 1 Once Daily  Allergies (verified): No Known Drug Allergies  Past History:  Past Medical History: Last updated:  01/12/2008 Hypertension Diabetes mellitus, type II Hyperlipidemia Depression chest pain pain in joint, unspecified abdominal pain, right upper quadrant obesity  Review of Systems  The patient denies hypoglycemia.    Physical Exam  General:  obese.  no distress    Impression & Recommendations:  Problem # 1:  DIABETES MELLITUS, TYPE II, UNCONTROLLED (ICD-250.02) needs increased rx the once daily insulin schedule is helping her compliance  Medications Added to Medication List This Visit: 1)  Levemir 100 Unit/ml Soln (Insulin detemir) .... 275 units each am, and syringes 3/day 2)  Levemir Flexpen 100 Unit/ml Soln (Insulin detemir) .... 275 units each am, and pen needes 5 per day  Other Orders: Est. Patient Level III ([address])  Patient Instructions: 1)  increase levemir to 275 units each am. 2)  check your blood glucose 2 times a day.  vary the time of day between before the 3 meals and at bedtime.  also check if you feel as though your glucose might be very high or too low.  bring a record of this to your doctor appointments.   3)  return 6 weeks. Prescriptions: LEVEMIR FLEXPEN 100 UNIT/ML SOLN (INSULIN DETEMIR) 275 units each am, and pen needes 5 per day  #1 box x 11   Entered  and Authorized by:   Minus Breeding MD   Signed by:   Minus Breeding MD on 03/21/2010   Method used:   Print then Give to Patient   RxID:   [id number]    Orders Added: 1)  Est. Patient Level III [[address]]

## 2010-06-20 NOTE — Progress Notes (Signed)
Summary: Humolog vs Levermir  Phone Note Call from Patient   Caller: Patient [phone] Summary of Call: Pt called stating she has 3 remaining bottles of Humalog that will expire soon and she would like to use the before refilling her Levermir. Pt is requesting MD advisement? Initial call taken by: Margaret Pyle, CMA,  April 18, 2010 4:23 PM  Follow-up for Phone Call        they are very different.  please take only the levemir Follow-up by: Minus Breeding MD,  April 18, 2010 4:28 PM  Additional Follow-up for Phone Call Additional follow up Details #1::        unable to reach pt on number provided, work number is [employer] for school and home number's VM has not been set up yet. Margaret Pyle, CMA  April 19, 2010 4:19 PM  called again, same result. Margaret Pyle, CMA  April 20, 2010 4:21 PM  same as above. Closing phone note until further contact from pt Margaret Pyle, CMA  April 24, 2010 10:02 AM

## 2010-06-22 ENCOUNTER — Encounter: Payer: Self-pay | Admitting: Endocrinology

## 2010-06-22 NOTE — Assessment & Plan Note (Signed)
Summary: 1 mos f/u #/cd   Vital Signs:  Patient profile:   55 year old female Height:      66 inches (167.64 cm) Weight:      239 pounds (108.64 kg) BMI:     38.72 O2 Sat:      97 % on Room air Temp:     98.4 degrees F (36.89 degrees C) oral Pulse rate:   114 / minute BP sitting:   154 / 102  (left arm) Cuff size:   large  Vitals Entered By: Brenton Grills CMA (AAMA) (May 30, 2010 3:15 PM)  O2 Flow:  Room air CC: 1 month F/U/aj Is Patient Diabetic? Yes   Primary Provider:  Marga Melnick, M.d.  CC:  1 month F/U/aj.  History of Present Illness: no cbg record, but states cbg's are 102-240.  it is in general higher later in the day.  pt states she feels well in general.  she has missed insulin only twice since her last ov.  Current Medications (verified): 1)  Norvasc 10 Mg  Tabs (Amlodipine Besylate) .Marland Kitchen.. 1 By Mouth Qd 2)  Spironolactone 25 Mg  Tabs (Spironolactone) .Marland Kitchen.. 1 By Mouth Bid 3)  Singulair 10 Mg  Tabs (Montelukast Sodium) .Marland Kitchen.. 1 By Mouth Qd 4)  Ventolin Hfa 108 (90 Base) Mcg/act  Aers (Albuterol Sulfate) .Marland Kitchen.. 1-2 Sprays Q4 Hours As Needed For Sob 5)  Symbicort 160-4.5 Mcg/act  Aero (Budesonide-Formoterol Fumarate) .... 2 Puffs Two Times A Day in Place of Advair 6)  Quinapril Hcl 40 Mg  Tabs (Quinapril Hcl) .Marland Kitchen.. 1 Qd 7)  Onetouch Ultra Test  Strp (Glucose Blood) .... Use As Directed 8)  Metoprolol Succinate 200 Mg Xr24h-Tab (Metoprolol Succinate) .Marland Kitchen.. 1 Tab Once Daily 9)  Bd Insulin Syringe Ultrafine 31g X 5/16" 1 Ml Misc (Insulin Syringe-Needle U-100) .Marland Kitchen.. 1 Once Daily 10)  Levemir 100 Unit/ml Soln (Insulin Detemir) .... 300 Units Each Am, and 100-Unit Syringes For Use Once Daily  Allergies (verified): No Known Drug Allergies  Past History:  Past Medical History: Last updated: 01/12/2008 Hypertension Diabetes mellitus, type II Hyperlipidemia Depression chest pain pain in joint, unspecified abdominal pain, right upper quadrant obesity  Review of  Systems  The patient denies hypoglycemia.    Physical Exam  General:  obese.  no distress  Neck:  Supple without thyroid enlargement or tenderness.    Impression & Recommendations:  Problem # 1:  DIABETES MELLITUS, TYPE II, UNCONTROLLED (ICD-250.02) needs increased rx  Medications Added to Medication List This Visit: 1)  Levemir 100 Unit/ml Soln (Insulin detemir) .... 325 units each am, and 100-unit syringes for use once daily  Other Orders: TLB-A1C / Hgb A1C (Glycohemoglobin) (83036-A1C) Est. Patient Level III (84132)  Patient Instructions: 1)  blood tests are being ordered for you today.  please call [phone] to hear your test results. 2)  pending the test results, please increase levemir to 325 units each am.   3)  check your blood glucose 2 times a day.  vary the time of day between before the 3 meals and at bedtime.  also check if you feel as though your glucose might be very high or too low.  bring a record of this to your doctor appointments.   4)  return 1 month.     Orders Added: 1)  TLB-A1C / Hgb A1C (Glycohemoglobin) [83036-A1C] 2)  Est. Patient Level III [25366]

## 2010-06-22 NOTE — Progress Notes (Signed)
Summary: Levemir rx  Phone Note Call from Patient Call back at Home Phone [phone]   Caller: Patient Summary of Call: Pt would like rx of Levemir changed to vials so that she does not have to inject herself multiple times to get correct dosage of insulin but also would like rx for pens to use when she travels and needs rx for insulin syringes for vials-please advise Initial call taken by: Brenton Grills CMA Duncan Dull),  May 02, 2010 4:55 PM  Follow-up for Phone Call        i sent rx Follow-up by: Minus Breeding MD,  May 02, 2010 7:15 PM    New/Updated Medications: LEVEMIR 100 UNIT/ML SOLN (INSULIN DETEMIR) 300 units each am, and 100-unit syringes for use once daily Prescriptions: LEVEMIR 100 UNIT/ML SOLN (INSULIN DETEMIR) 300 units each am, and 100-unit syringes for use once daily  #10 vials x 11   Entered and Authorized by:   Minus Breeding MD   Signed by:   Minus Breeding MD on 05/02/2010   Method used:   Electronically to        [facility] (retail)       [address]       [place], [place]  [address]       Ph: [phone]       Fax: [phone]   RxID:   [id number]

## 2010-06-22 NOTE — Assessment & Plan Note (Signed)
Summary: 6 WK FU---STC   Vital Signs:  Patient profile:   55 year old female Height:      66 inches (167.64 cm) Weight:      235.50 pounds (107.05 kg) BMI:     38.15 O2 Sat:      96 % on Room air Temp:     98.7 degrees F (37.06 degrees C) oral Pulse rate:   92 / minute Pulse rhythm:   regular BP sitting:   152 / 102  (left arm) Cuff size:   large  Vitals Entered By: Brenton Grills CMA Duncan Dull) (May 02, 2010 3:31 PM)  O2 Flow:  Room air CC: Follow-up visit/aj Is Patient Diabetic? Yes   Primary Provider:  Marga Melnick, M.d.  CC:  Follow-up visit/aj.  History of Present Illness: pt ran out of levemir 1 week ago.  when she was on this insulin, cbg's were 152-300.  she says it is in general higher later in the day.    Current Medications (verified): 1)  Norvasc 10 Mg  Tabs (Amlodipine Besylate) .Marland Kitchen.. 1 By Mouth Qd 2)  Spironolactone 25 Mg  Tabs (Spironolactone) .Marland Kitchen.. 1 By Mouth Bid 3)  Singulair 10 Mg  Tabs (Montelukast Sodium) .Marland Kitchen.. 1 By Mouth Qd 4)  Ventolin Hfa 108 (90 Base) Mcg/act  Aers (Albuterol Sulfate) .Marland Kitchen.. 1-2 Sprays Q4 Hours As Needed For Sob 5)  Symbicort 160-4.5 Mcg/act  Aero (Budesonide-Formoterol Fumarate) .... 2 Puffs Two Times A Day in Place of Advair 6)  Quinapril Hcl 40 Mg  Tabs (Quinapril Hcl) .Marland Kitchen.. 1 Qd 7)  Onetouch Ultra Test  Strp (Glucose Blood) .... Use As Directed 8)  Metoprolol Succinate 200 Mg Xr24h-Tab (Metoprolol Succinate) .Marland Kitchen.. 1 Tab Once Daily 9)  Levemir 100 Unit/ml Soln (Insulin Detemir) .... 275 Units Each Am, and Syringes 3/day 10)  Bd Insulin Syringe Ultrafine 31g X 5/16" 1 Ml Misc (Insulin Syringe-Needle U-100) .Marland Kitchen.. 1 Once Daily 11)  Levemir Flexpen 100 Unit/ml Soln (Insulin Detemir) .... 275 Units Each Am, and Pen Needes 5 Per Day  Allergies (verified): No Known Drug Allergies  Past History:  Past Medical History: Last updated: 01/12/2008 Hypertension Diabetes mellitus, type II Hyperlipidemia Depression chest pain pain in  joint, unspecified abdominal pain, right upper quadrant obesity  Review of Systems  The patient denies hypoglycemia.    Physical Exam  General:  obese.  no distress  Pulses:  dorsalis pedis intact bilat.  Extremities:  no deformity.  no ulcer on the feet.  feet are of normal color and temp.     Neurologic:  sensation is intact to touch on the feet    Impression & Recommendations:  Problem # 1:  DIABETES MELLITUS, TYPE II, UNCONTROLLED (ICD-250.02) therapy limited by noncompliance.  i'll do the best i can. needs increased rx  Medications Added to Medication List This Visit: 1)  Levemir Flexpen 100 Unit/ml Soln (Insulin detemir) .... 300 units each am, and pen needles 5 per day  Other Orders: Est. Patient Level III (21308)  Patient Instructions: 1)  increase levemir to 300 units each am.   2)  check your blood glucose 2 times a day.  vary the time of day between before the 3 meals and at bedtime.  also check if you feel as though your glucose might be very high or too low.  bring a record of this to your doctor appointments.   3)  return 1 month.   Prescriptions: LEVEMIR FLEXPEN 100 UNIT/ML SOLN (INSULIN DETEMIR) 300 units  each am, and pen needles 5 per day  #8 boxes x 11   Entered and Authorized by:   Minus Breeding MD   Signed by:   Minus Breeding MD on 05/02/2010   Method used:   Electronically to        [facility] (retail)       [address]       [place], [place]  [address]       Ph: [phone]       Fax: [phone]   RxID:   [id number]    Orders Added: 1)  Est. Patient Level III [01027]   Immunization History:  Influenza Immunization History:    Influenza:  historical (02/18/2010)   Immunization History:  Influenza Immunization History:    Influenza:  Historical (02/18/2010)   Preventive Care Screening  Pap Smear:    Date:  02/18/2010    Results:  normal   Mammogram:    Date:  06/20/2009     Results:  normal

## 2010-06-28 NOTE — Letter (Signed)
Summary: [facility]   Imported By: Lanelle Bal 06/21/2010 14:21:04  _____________________________________________________________________  External Attachment:    Type:   Image     Comment:   External Document

## 2010-06-30 ENCOUNTER — Ambulatory Visit: Payer: Self-pay | Admitting: Endocrinology

## 2010-07-06 NOTE — Letter (Signed)
[facility]: [facility]   Imported By: Sherian Rein 06/28/2010 14:38:54  _____________________________________________________________________  External Attachment:    Type:   Image     Comment:   External Document

## 2010-07-10 ENCOUNTER — Ambulatory Visit (INDEPENDENT_AMBULATORY_CARE_PROVIDER_SITE_OTHER): Payer: BC Managed Care – PPO | Admitting: Internal Medicine

## 2010-07-10 ENCOUNTER — Ambulatory Visit: Payer: Self-pay | Admitting: Endocrinology

## 2010-07-10 ENCOUNTER — Encounter: Payer: Self-pay | Admitting: Internal Medicine

## 2010-07-10 ENCOUNTER — Telehealth: Payer: Self-pay | Admitting: Internal Medicine

## 2010-07-10 DIAGNOSIS — J45901 Unspecified asthma with (acute) exacerbation: Secondary | ICD-10-CM | POA: Insufficient documentation

## 2010-07-10 DIAGNOSIS — I10 Essential (primary) hypertension: Secondary | ICD-10-CM

## 2010-07-10 DIAGNOSIS — J209 Acute bronchitis, unspecified: Secondary | ICD-10-CM

## 2010-07-10 DIAGNOSIS — IMO0001 Reserved for inherently not codable concepts without codable children: Secondary | ICD-10-CM

## 2010-07-10 HISTORY — DX: Unspecified asthma with (acute) exacerbation: J45.901

## 2010-07-11 ENCOUNTER — Encounter: Payer: Self-pay | Admitting: Internal Medicine

## 2010-07-14 ENCOUNTER — Ambulatory Visit (INDEPENDENT_AMBULATORY_CARE_PROVIDER_SITE_OTHER): Payer: BC Managed Care – PPO | Admitting: Endocrinology

## 2010-07-14 ENCOUNTER — Encounter: Payer: Self-pay | Admitting: Endocrinology

## 2010-07-14 DIAGNOSIS — IMO0001 Reserved for inherently not codable concepts without codable children: Secondary | ICD-10-CM

## 2010-07-18 NOTE — Assessment & Plan Note (Signed)
Summary: cold/kn   Vital Signs:  Patient profile:   55 year old female Height:      66 inches O2 Sat:      94 % on Room air Temp:     98.2 degrees F oral Pulse rate:   118 / minute Resp:     15 per minute BP sitting:   160 / 120  (left arm)  Vitals Entered By: Jeremy Johann CMA (July 10, 2010 12:14 PM)  O2 Flow:  Room air CC: cough, congestion, chills, bodyaches, fever, URI symptoms, COPD follow-up   Primary Care Provider:  Marga Melnick, M.d.  CC:  cough, congestion, chills, bodyaches, fever, URI symptoms, and COPD follow-up.  History of Present Illness:    Onset several weeks ago as fever & chills; she now reports dry cough, but denies nasal congestion, purulent nasal discharge, sore throat, and earache.  Associated symptoms include fever of 100.5-103 degrees, dyspnea, and wheezing.  The patient denies headache, bilateral facial pain, tooth pain, and tender adenopathy. She has  asthma (see below). Rx: Mucinex, Robituusin , Tylenol.    See BP; she is not monitoring BP @  home.  The patient reports urinary frequency, but denies edema.  The patient denies the following associated symptoms: chest pain and palpitations.  Compliance with medications (by patient report) has been near 100%.  The patient reports no exercise due to Orthopedic issues ; she fractured L ankle 01/18.  Adjunctive measures currently used by the patient include modified  salt restriction.      Asthma  update: She  reports nocturnal awakening and exercise induced symptoms with the RTI .  Medication use includes quick relief med  up to 4X/day daily but she ran out 02/17. She is using  controller med daily (Symbicort 2 puffs  twice a day).  Current Medications (verified): 1)  Norvasc 10 Mg  Tabs (Amlodipine Besylate) .Marland Kitchen.. 1 By Mouth Qd 2)  Spironolactone 25 Mg  Tabs (Spironolactone) .Marland Kitchen.. 1 By Mouth Bid 3)  Singulair 10 Mg  Tabs (Montelukast Sodium) .Marland Kitchen.. 1 By Mouth Qd 4)  Ventolin Hfa 108 (90 Base) Mcg/act  Aers  (Albuterol Sulfate) .Marland Kitchen.. 1-2 Sprays Q4 Hours As Needed For Sob 5)  Symbicort 160-4.5 Mcg/act  Aero (Budesonide-Formoterol Fumarate) .... 2 Puffs Two Times A Day in Place of Advair 6)  Quinapril Hcl 40 Mg  Tabs (Quinapril Hcl) .Marland Kitchen.. 1 Qd 7)  Onetouch Ultra Test  Strp (Glucose Blood) .... Use As Directed 8)  Metoprolol Succinate 200 Mg Xr24h-Tab (Metoprolol Succinate) .Marland Kitchen.. 1 Tab Once Daily 9)  Bd Insulin Syringe Ultrafine 31g X 5/16" 1 Ml Misc (Insulin Syringe-Needle U-100) .Marland Kitchen.. 1 Once Daily 10)  Levemir 100 Unit/ml Soln (Insulin Detemir) .... 325 Units Each Am, and 100-Unit Syringes For Use Once Daily  Allergies (verified): No Known Drug Allergies  Review of Systems Endo:  FBS in 130s.  Physical Exam  General:  in no acute distress; alert,appropriate and cooperative throughout examination Ears:  External ear exam shows no significant lesions or deformities.  Otoscopic examination reveals clear canals, tympanic membranes are intact bilaterally without bulging, retraction, inflammation or discharge. Hearing is grossly normal bilaterally. Nose:  External nasal examination shows no deformity or inflammation. Nasal mucosa are  dry without lesions or exudates. Mouth:  Oral mucosa and oropharynx without lesions or exudates.  Teeth in good repair. Lungs:  Normal respiratory effort, chest expands symmetrically. Lungs : diffuse low grade wheezing . Heart:  Normal rate and regular rhythm. S1 and  S2 normal without gallop, murmur, click, rub or other extra sounds. Cervical Nodes:  No lymphadenopathy noted Axillary Nodes:  No palpable lymphadenopathy Psych:  memory intact for recent and remote, normally interactive, and good eye contact.     Impression & Recommendations:  Problem # 1:  ACUTE BRONCHITIS (ICD-466.0)  Her updated medication list for this problem includes:    Singulair 10 Mg Tabs (Montelukast sodium) .Marland Kitchen... 1 by mouth once daily once daily (necessary for asthma control)    Ventolin Hfa  108 (90 Base) Mcg/act Aers (Albuterol sulfate) .Marland Kitchen... 1-2 sprays q4 hours as needed for sob    Symbicort 160-4.5 Mcg/act Aero (Budesonide-formoterol fumarate) .Marland Kitchen... 2 puffs two times a day in place of advair    Azithromycin 250 Mg Tabs (Azithromycin) .Marland Kitchen... As per pack  Problem # 2:  ASTHMA NOS W/ACUTE EXACERBATION (ICD-493.92)  Her updated medication list for this problem includes:    Singulair 10 Mg Tabs (Montelukast sodium) .Marland Kitchen... 1 by mouth once daily once daily (necessary for asthma control)    Ventolin Hfa 108 (90 Base) Mcg/act Aers (Albuterol sulfate) .Marland Kitchen... 1-2 sprays q4 hours as needed for sob    Symbicort 160-4.5 Mcg/act Aero (Budesonide-formoterol fumarate) .Marland Kitchen... 2 puffs two times a day in place of advair  Problem # 3:  ESSENTIAL HYPERTENSION (ICD-401.9) BP uncontrolled Her updated medication list for this problem includes:    Norvasc 10 Mg Tabs (Amlodipine besylate) .Marland Kitchen... 1 by mouth qd    Spironolactone 25 Mg Tabs (Spironolactone) .Marland Kitchen... 1 by mouth bid    Quinapril Hcl 40 Mg Tabs (Quinapril hcl) .Marland Kitchen... 1 qd    Metoprolol Succinate 200 Mg Xr24h-tab (Metoprolol succinate) .Marland Kitchen... 1 tab once daily  Problem # 4:  DIABETES MELLITUS, TYPE II, UNCONTROLLED (ICD-250.02) as per Dr Everardo All  Her updated medication list for this problem includes:    Quinapril Hcl 40 Mg Tabs (Quinapril hcl) .Marland Kitchen... 1 qd    Levemir 100 Unit/ml Soln (Insulin detemir) .Marland Kitchen... 325 units each am, and 100-unit syringes for use once daily  Complete Medication List: 1)  Norvasc 10 Mg Tabs (Amlodipine besylate) .Marland Kitchen.. 1 by mouth qd 2)  Spironolactone 25 Mg Tabs (Spironolactone) .Marland Kitchen.. 1 by mouth bid 3)  Singulair 10 Mg Tabs (Montelukast sodium) .Marland Kitchen.. 1 by mouth once daily once daily (necessary for asthma control) 4)  Ventolin Hfa 108 (90 Base) Mcg/act Aers (Albuterol sulfate) .Marland Kitchen.. 1-2 sprays q4 hours as needed for sob 5)  Symbicort 160-4.5 Mcg/act Aero (Budesonide-formoterol fumarate) .... 2 puffs two times a day in place of  advair 6)  Quinapril Hcl 40 Mg Tabs (Quinapril hcl) .Marland Kitchen.. 1 qd 7)  Onetouch Ultra Test Strp (Glucose blood) .... Use as directed 8)  Metoprolol Succinate 200 Mg Xr24h-tab (Metoprolol succinate) .Marland Kitchen.. 1 tab once daily 9)  Bd Insulin Syringe Ultrafine 31g X 5/16" 1 Ml Misc (Insulin syringe-needle u-100) .Marland Kitchen.. 1 once daily 10)  Levemir 100 Unit/ml Soln (Insulin detemir) .... 325 units each am, and 100-unit syringes for use once daily 11)  Azithromycin 250 Mg Tabs (Azithromycin) .... As per pack  Patient Instructions: 1)  Check your Blood Pressure regularly. If it is above: 135/85 ON AVERAGE  you should  increase Quinipri to 40 mg 1&1/2 once daily . Use the spacer for the inhalers. CXray if no better over 24 hrs. Prescriptions: SINGULAIR 10 MG  TABS (MONTELUKAST SODIUM) 1 by mouth once daily once daily (necessary for asthma control)  #30 x 11   Entered and Authorized by:   Chrissie Noa  Seydina Holliman MD   Signed by:   Marga Melnick MD on 07/10/2010   Method used:   Electronically to        [facility] ([facility])       [address]       [place], [place]  [address]       Ph: [phone]       Fax: [phone]   RxID:   [id number] VENTOLIN HFA 108 (90 BASE) MCG/ACT  AERS (ALBUTEROL SULFATE) 1-2 sprays q4 hours as needed for sob  #1 x 2   Entered and Authorized by:   Marga Melnick MD   Signed by:   Marga Melnick MD on 07/10/2010   Method used:   Electronically to        [facility] ([facility])       [address]       [place], [place]  [address]       Ph: [phone]       Fax: [phone]   RxID:   [id number] AZITHROMYCIN 250 MG TABS (AZITHROMYCIN) as per pack  #1 x 0   Entered and Authorized by:   Marga Melnick MD   Signed by:   Marga Melnick MD on 07/10/2010   Method used:   Electronically to        [facility] ([facility])       [address]       [place], [place]  [address]        Ph: [phone]       Fax: [phone]   RxID:   [id number]    Orders Added: 1)  Est. Patient Level IV [36144]

## 2010-07-18 NOTE — Progress Notes (Addendum)
Summary: PA--Singulair  Phone Note Refill Request Message from:  Fax from Pharmacy on July 10, 2010 2:59 PM  Refills Requested: Medication #1:  SINGULAIR 10 MG  TABS 1 by mouth once daily once daily (necessary for asthma control) prior auth - [id number]  Initial call taken by: Okey Regal [place],  July 10, 2010 2:59 PM  Follow-up for Phone Call        Forms requested. Lucious Groves CMA  July 10, 2010 3:14 PM  I have not received anything, called [employer] and requested forms again. Lucious Groves CMA  July 11, 2010 10:40 AM   Fom received and pending MD completion, I am not aware if the pt has tried any OTC/generic products. Lucious Groves CMA  July 11, 2010 2:27 PM   Additional Follow-up for Phone Call Additional follow up Details #1::        done  Additional Follow-up by: Marga Melnick MD,  July 11, 2010 4:06 PM    Additional Follow-up for Phone Call Additional follow up Details #2::    Forms completed and faxed, will await insurance company reply. Lucious Groves CMA  July 11, 2010 4:07 PM    Appended Document: PA--Singulair Called to check on prior authorization and automated system notes that it is "in process".  Appended Document: PA--Singulair Approved until 07/17/11.

## 2010-07-20 ENCOUNTER — Encounter: Payer: Self-pay | Admitting: Internal Medicine

## 2010-07-27 NOTE — Assessment & Plan Note (Signed)
Summary: f/u appt   Vital Signs:  Patient profile:   55 year old female Height:      66 inches (167.64 cm) Weight:      239 pounds (108.64 kg) BMI:     38.72 O2 Sat:      96 % on Room air Temp:     98.6 degrees F (37.00 degrees C) oral Pulse rate:   84 / minute Pulse rhythm:   regular BP sitting:   124 / 76  (left arm) Cuff size:   large  Vitals Entered By: Brenton Grills CMA Duncan Dull) (July 14, 2010 3:20 PM)  O2 Flow:  Room air CC: Follow-up visit/aj Is Patient Diabetic? Yes   Primary Provider:  Marga Melnick, M.d.  CC:  Follow-up visit/aj.  History of Present Illness: no cbg record, but states cbg's are improved on the increased insulin.  however, she missed her dose today.  she says the range is 64-400.  it was highest after valentine's day, and lowest another day, in the afternoon.  she misses her insulin approx 1-3x/month.      Current Medications (verified): 1)  Norvasc 10 Mg  Tabs (Amlodipine Besylate) .Marland Kitchen.. 1 By Mouth Qd 2)  Spironolactone 25 Mg  Tabs (Spironolactone) .Marland Kitchen.. 1 By Mouth Bid 3)  Singulair 10 Mg  Tabs (Montelukast Sodium) .Marland Kitchen.. 1 By Mouth Once Daily Once Daily (Necessary For Asthma Control) 4)  Ventolin Hfa 108 (90 Base) Mcg/act  Aers (Albuterol Sulfate) .Marland Kitchen.. 1-2 Sprays Q4 Hours As Needed For Sob 5)  Symbicort 160-4.5 Mcg/act  Aero (Budesonide-Formoterol Fumarate) .... 2 Puffs Two Times A Day in Place of Advair 6)  Quinapril Hcl 40 Mg  Tabs (Quinapril Hcl) .Marland Kitchen.. 1 Qd 7)  Onetouch Ultra Test  Strp (Glucose Blood) .... Use As Directed 8)  Metoprolol Succinate 200 Mg Xr24h-Tab (Metoprolol Succinate) .Marland Kitchen.. 1 Tab Once Daily 9)  Bd Insulin Syringe Ultrafine 31g X 5/16" 1 Ml Misc (Insulin Syringe-Needle U-100) .Marland Kitchen.. 1 Once Daily 10)  Levemir 100 Unit/ml Soln (Insulin Detemir) .... 325 Units Each Am, and 100-Unit Syringes For Use Once Daily 11)  Azithromycin 250 Mg Tabs (Azithromycin) .... As Per Pack  Allergies (verified): No Known Drug Allergies  Past  History:  Past Medical History: Last updated: 01/12/2008 Hypertension Diabetes mellitus, type II Hyperlipidemia Depression chest pain pain in joint, unspecified abdominal pain, right upper quadrant obesity  Review of Systems  The patient denies syncope.    Physical Exam  General:  obese.  no distress  Skin:  injection sites at the anterior abdomen are without lesions.     Impression & Recommendations:  Problem # 1:  DIABETES MELLITUS, TYPE II, UNCONTROLLED (ICD-250.02) therapy limited by noncompliance.  i'll do the best i can.  Other Orders: Est. Patient Level III ([address])  Patient Instructions: 1)  check your blood glucose 2 times a day.  vary the time of day between before the 3 meals and at bedtime.  also check if you feel as though your glucose might be very high or too low.  bring a record of this to your doctor appointments.   2)  return 6 weeks. 3)  it is very important to never miss your insulin.   4)  good diet and exercise habits significanly improve the control of your diabetes.  please let me know if you wish to be referred to a dietician.  high blood sugar is very risky to your health.  you should see an eye doctor every year. 5)  controlling your blood pressure and cholesterol drastically reduces the damage diabetes does to your body.  this also applies to quitting smoking.  please discuss these with your doctor.  you should take an aspirin every day, unless you have been advised by a doctor not to.   Orders Added: 1)  Est. Patient Level III [63875]

## 2010-07-27 NOTE — Medication Information (Signed)
Summary: Prior Authorization Request for Singulair  Prior Authorization Request for Singulair   Imported By: Maryln Gottron 07/20/2010 09:22:23  _____________________________________________________________________  External Attachment:    Type:   Image     Comment:   External Document

## 2010-09-04 ENCOUNTER — Ambulatory Visit: Payer: BC Managed Care – PPO | Admitting: Endocrinology

## 2010-09-04 DIAGNOSIS — Z0289 Encounter for other administrative examinations: Secondary | ICD-10-CM

## 2010-10-03 NOTE — Assessment & Plan Note (Signed)
[facility]                            CARDIOLOGY OFFICE NOTE   NAME:Rebekah Kennedy, Rebekah Kennedy                      MRN:          [id number]  DATE:02/07/2007                            DOB:          12/05/1955    PRIMARY:  Dr. Alwyn Ren.   REASON FOR PRESENTATION:  Evaluate the patient for chest pain.   HISTORY OF PRESENT ILLNESS:  The patient is a very pleasant 55 year old  African-American female with multiple cardiovascular risk factors as  described below.  She had some episodes of pre-syncope in 2005 and had  an echocardiogram, which demonstrated no abnormalities.  Over the past  several weeks, she has been having chest discomfort.  She says she  notices this throughout the day.  It is sporadic.  It is a sharp,  shooting discomfort.  It is 10/10 in intensity.  It lasts for only  seconds.  She cannot bring it on.  She may have some substernally.  She  may have this radiate under her right hemidiaphragm.  She also has some  of the same shooting pain in her feet and in her hands.  They all occur  at different times.  There is no associated nausea, vomiting, or  diaphoresis.  There are no associated palpitations.  She has had no pre-  syncope or syncope.  She chronically sleeps on 3 to 4 pillows, but this  is not new.  She is not having any PND.  She never had these kinds of  pains before.  She does not take anything to try to get rid of them, as  they go away fairly quickly.   PAST MEDICAL HISTORY:  Hypertension times 33 years.  Hyperlipidemia.  Diabetes mellitus x2 years.  Asthma.  Pneumonia.   PAST SURGICAL HISTORY:  Breast reduction.  Uterine myomectomy.  Ovarian  cyst removal.   ALLERGIES:  None.   MEDICATIONS:  1. Norvasc 10 mg daily.  2. Actos 45 mg daily.  3. Toprol 100 mg daily.  4. Humalog.  5. Advair Diskus.  6. Singulair.  7. Spironolactone 25 mg b.i.d.  8. Ventolin.   SOCIAL HISTORY:  The patient is a Runner, broadcasting/film/video.  She is single.  She has  2  adopted children.  She does not smoke cigarettes.  She rarely drinks  alcohol.   FAMILY HISTORY:  Contributory for a father dying of a stroke at age 67.  She has a brother with early onset hypertension as well.   REVIEW OF SYSTEMS:  As stated in the HPI and otherwise positive for  occasional cough and wheezing associated with the asthma.  Negative for  all other systems.   PHYSICAL EXAMINATION:  The patient is in no distress.  Blood pressure 168/98, heart rate 113 and regular, weight 251 pounds,  body mass index 42.  HEENT:  Eyelids unremarkable.  Pupils are equal, round, and reactive to  light and accommodation.  Fundi are not visualized.  Oral mucosa  unremarkable.  NECK:  No jugular venous distension at 45 degrees, carotid upstroke  brisk and symmetric, no bruits, thyromegaly.  LYMPHATICS:  No cervical,  axillary, or inguinal adenopathy.  LUNGS:  Few expiratory wheezes.  No dullness to percussion.  No  crackles.  BACK:  No costovertebral angle tenderness.  CHEST:  Unremarkable.  HEART:  PMI not displaced or sustained, S1 and S2 within normal limits,  no S3, no S4, no clicks, rubs, murmurs.  ABDOMEN:  Obese, positive bowel sounds, normal in frequency and pitch,  no bruits, rebound, guarding.  No midline pulsatile masses,  hepatomegaly, splenomegaly.  SKIN:  No rashes, no nodules.  EXTREMITIES:  With 2+ pulses throughout, no edema, cyanosis, clubbing.  NEURO:  Oriented to person, place, and time, cranial nerves 2-12 grossly  intact, motor grossly intact.   EKG:  Sinus tachycardia rate 103, axis within normal limits, poor  anterior R wave progression, no acute ST-T wave changes, intervals  within normal limits.   ASSESSMENT AND PLAN:  1. Chest discomfort.  The patient's chest discomfort is atypical.      However, she does have significant cardiovascular risk factors.      Pre-test probability of obstructive coronary artery disease is low      moderate.  At this point, I  think she needs screening with an      exercise perfusion study.  She would be able to walk on a      treadmill.  I am not going to have her hold her beta blocker,      however, as I think if I do, her blood pressure will be too      elevated for the study.  I do believe she will be able to achieve      her target heart rate on beta blocker.  2. Hypertension.  Her blood pressure has been going on for years.  I      am not sure she has had any workup for secondary causes.  I am      going to increase the Toprol to 150 mg daily.  Dr. Alwyn Ren will      follow up.  I might suggest renal ultrasound or CT angiogram to      rule out any fibromuscular dysplasia if there has been no workup in      the past.  3. Followup will be based on the results of the above.     Rollene Rotunda, MD, [facility]  Electronically Signed    JH/MedQ  DD: 02/07/2007  DT: 02/07/2007  Job #: [id number]   cc:   Titus Dubin. Alwyn Ren, MD,FACP,FCCP

## 2010-10-03 NOTE — Op Note (Signed)
Rebekah Kennedy, Rebekah Kennedy               ACCOUNT NO.:  [id number]   MEDICAL RECORD NO.:  [id number]          PATIENT TYPE:  AMB   LOCATION:  [facility]                         FACILITY:  [facility]   PHYSICIAN:  Daniel L. Gottsegen, M.D.DATE OF BIRTH:  1955-06-16   DATE OF PROCEDURE:  03/07/2007  DATE OF DISCHARGE:                               OPERATIVE REPORT   PREOPERATIVE DIAGNOSIS:  Menometrorrhagia.   POSTOPERATIVE DIAGNOSIS:  Menometrorrhagia.   OPERATION:  Hysteroscopy with endometrial sampling.   SURGEON:  Daniel L. Eda Paschal, M.D.   ANESTHESIA:  General.   INDICATIONS:  The patient is a 55 year old nulligravida who has been  followed by me for a long time with menometrorrhagia.  She has basically  been well controlled on Depo-Provera. On ultrasound, she has had  multiple small fibroids and previous endometrial sampling which had been  done in the office was normal.  The patient had been difficult to sample  in the office because of her nulligravida and just difficulty  technically in examining her plus discomfort for the patient. Generally,  after endometrial sampling had been obtained and the patient was treated  with Depo-Provera, she could be made amenorrheic and this controlled her  heavy periods that came too often and too long as well as her  dysmenorrhea. However, over the past six months, she has had bleeding in  spite of the Depo-Provera which has been unusual for her. Ultrasound  continues to show small fibroids.  Because of concern that she may have  new intrauterine pathology and because of the limitations of office  sampling, she enters the hospital now for hysteroscopy and D&C to be  sure that there is nothing more significantly wrong or that she does not  have some intrauterine pathology that could be treated  hysteroscopically.   FINDINGS:  External is normal. BUS is normal.  Vaginal is normal.  Cervix is clean.  The patient is nulliparous with a small os.  The  uterus is enlarged by small myomas to about 8 weeks size.  Adnexa failed  to reveal masses. At the time of hysteroscopy, the patient had 4-5  seedling type myomas that could be seen throughout the cavity.  They  were not actually intracavitary, but you could see them through the  endometrium bulging. On the posterior wall of the endometrium, there was  an irregularity which made the physician feel that the patient probably  had a deep submucous myoma posteriorly that was not actually in the  endometrial cavity.  The endometrial lining, itself, was fairly thin  consistent with previous Depo-Provera and the patient's age of 55  without any significant pathology.   PROCEDURE:  After adequate general anesthesia, the patient was placed in  the dorsal supine position and prepped and draped in the usual sterile  manner.  A single tooth tenaculum was placed on the anterior lip of the  cervix.  The patient was very carefully dilated to a #21 Pratt dilator,  taking care to do this atraumatically, especially with her stenotic  cervix.  She was able to be dilated.  A diagnostic hysteroscope could  be  placed. 3% sorbitol was used to expand the intrauterine cavity and a  camera was used for magnification.  The findings discussed above were  seen. Using several different size curettes, multiple endometrial  curettings were obtained. The patient was re-  hysteroscoped, there was no trauma from the procedure. There was clearly  nothing that could be easily resected. The procedure was, therefore,  terminated.  Fluid deficit was negligible. Blood loss was negligible.  The patient left the operating room in satisfactory condition.      Daniel L. Eda Paschal, M.D.  Electronically Signed     DLG/MEDQ  D:  03/07/2007  T:  03/08/2007  Job:  [id number]
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
## 2010-10-03 NOTE — Consult Note (Signed)
[facility]                          ENDOCRINOLOGY CONSULTATION   NAME:Rebekah Kennedy, Rebekah Kennedy                      MRN:          [id number]  DATE:11/27/2006                            DOB:          06/01/1955    REFERRING PHYSICIAN:  Titus Dubin. Alwyn Ren, MD,FACP,FCCP   REASON FOR REFERRAL:  Diabetes.   HISTORY OF PRESENT ILLNESS:  A 55 year old woman who reports a 4-year  history of diabetes with no chronic complications.  She took Actos and  Glimepiride, with an improvement of her A1c to 6.5.  Then increased  recently to 9.5 and addition of Januvia 100 mg a day has not helped  this.  She has several months of slight numbness of the feet with no  associated pain.  She describes her diet and exercise as both poor.  She states Byetta did not help her diabetes.   PAST MEDICAL HISTORY:  1. Asthma.  2. Dyslipidemia.  3. Hypertension.   ALLERGIES:  SHE HAD DIARRHEA, WHEN SHE TOOK METFORMIN.   SOCIAL HISTORY:  She is single.  She is a Runner, broadcasting/film/video.   FAMILY HISTORY:  Positive for diabetes in her father and in one sibling.   REVIEW OF SYSTEMS:  She denies weight gain and weight loss.   PHYSICAL EXAMINATION:  VITAL SIGNS:  Blood pressure 158/92.  Heart rate  103, temperature 97.5.  The weight is 251.  GENERAL:  No distress.  She is obese.  SKIN:  No rash, not diaphoretic.  HEENT:  No proptosis, no periorbital swelling.  Pharynx normal.  NECK:  No goiter.  CHEST:  Clear to auscultation.  No respiratory distress.  CARDIOVASCULAR:  Trace bilateral pre-tibial edema.  Regular rate and  rhythm.  No murmur.  Carotid arteries have no bruit.  EXTREMITIES:  Dorsalis pedis pulses are intact bilaterally.  Feet:  Normal color and temperature.  There is no ulcer present on the feet.  NEUROLOGIC:  Alert, oriented, does not appear anxious nor depressed.  Sensation is intact to touch in the feet, but decreased from normal.   IMPRESSION:  1. Given the medication she is  taking, the increase in her A1c, and      her intolerance to Metformin, she has failed oral agents.  2. Slight numbness of the feet.  3. Slight edema which could be her Actos or Norvasc or obesity, or      some combination of these.  4. We discussed the risk of diabetes and the importance of diet and      exercise therapy.  5. Refer to bariatric surgery.  6. Change oral agents to Humalog 5 units t.i.d. (q.a.c.).  7. Return in 2 weeks.  8. I told her she may increase the Humalog p.r.n. elevated glucose.     Sean A. Everardo All, MD  Electronically Signed    SAE/MedQ  DD: 12/01/2006  DT: 12/02/2006  Job #: [id number]   cc:   Titus Dubin. Alwyn Ren, MD,FACP,FCCP

## 2010-10-06 NOTE — Op Note (Signed)
[facility]  Patient:    Rebekah Kennedy, Rebekah Kennedy                      MRN: [id number] Proc. Date: 05/02/00 Adm. Date:  [id number] Attending:  Teena Dunk                           Operative Report  PREOPERATIVE DIAGNOSIS:  Left carpal tunnel syndrome.  POSTOPERATIVE DIAGNOSIS:  Left carpal tunnel syndrome.  OPERATION PERFORMED:  Left carpal tunnel release with synovectomy.  SURGEON:  Sharlot Gowda., M.D.  ANESTHESIA:  General.  TOURNIQUET TIME:  18 minutes.  INDICATIONS FOR PROCEDURE:  The patient is a 55 year old with severe left carpal tunnel syndrome thought to be amenable to outpatient release.  DESCRIPTION OF PROCEDURE:  Sterile prep and drape.  Exsanguination of the arm. placement of the tourniquet at 250 mmHg.  A curvilinear incision was made just lateral to the thenar crease, dissection carried carefully on the ulnar side of the transverse carpal ligament which was extremely thickened and with a very thickened palmaris brevis which was released.  The nerve itself was severely constricted with moderate synovitis surrounding the synovium of the hand which was dissected.  The nerve itself was scarred in relative to the synovium requiring more elaborate dissection than normal and likewise dissection was carried out proximally to include any brachial fascia of the wrist with good release of the nerve which showed a lot of discoloration consistent with a severe carpal tunnel.  The wound was irrigated and closed with interrupted nylon.  Lightly compressive sterile dressing.  Volar plaster splint applied. DD:  05/02/00 TD:  05/02/00 Job: [id number]

## 2010-10-13 ENCOUNTER — Ambulatory Visit: Payer: BC Managed Care – PPO | Admitting: Endocrinology

## 2010-10-13 DIAGNOSIS — Z0289 Encounter for other administrative examinations: Secondary | ICD-10-CM

## 2011-01-18 ENCOUNTER — Encounter: Payer: Self-pay | Admitting: Obstetrics and Gynecology

## 2011-01-18 ENCOUNTER — Ambulatory Visit (INDEPENDENT_AMBULATORY_CARE_PROVIDER_SITE_OTHER): Payer: BC Managed Care – PPO | Admitting: Obstetrics and Gynecology

## 2011-01-18 ENCOUNTER — Other Ambulatory Visit (HOSPITAL_COMMUNITY)
Admission: RE | Admit: 2011-01-18 | Discharge: 2011-01-18 | Disposition: A | Discharge status: Home / Self Care | Payer: BC Managed Care – PPO | Source: Ambulatory Visit | Attending: Obstetrics and Gynecology | Admitting: Obstetrics and Gynecology

## 2011-01-18 VITALS — BP 134/86 | Ht 65.0 in | Wt 220.0 lb

## 2011-01-18 DIAGNOSIS — Z01419 Encounter for gynecological examination (general) (routine) without abnormal findings: Secondary | ICD-10-CM | POA: Insufficient documentation

## 2011-01-18 DIAGNOSIS — B373 Candidiasis of vulva and vagina: Secondary | ICD-10-CM

## 2011-01-18 DIAGNOSIS — D219 Benign neoplasm of connective and other soft tissue, unspecified: Secondary | ICD-10-CM

## 2011-01-18 DIAGNOSIS — N898 Other specified noninflammatory disorders of vagina: Secondary | ICD-10-CM

## 2011-01-18 DIAGNOSIS — B3731 Acute candidiasis of vulva and vagina: Secondary | ICD-10-CM

## 2011-01-18 DIAGNOSIS — D259 Leiomyoma of uterus, unspecified: Secondary | ICD-10-CM

## 2011-01-18 MED ORDER — TERCONAZOLE 0.4 % VA CREA: 1.0000 | TOPICAL_CREAM | Freq: Every day | VAGINAL | Status: AC

## 2011-01-18 NOTE — Progress Notes (Signed)
The patient came to see me today for an annual GYN exam. She has had trouble with vulvar and vaginal itching and is used Monistat twice. She is diabetic. She is menopausal and is doing well without HRT. She is up-to-date on mammograms and bone densities. She does her lab through her PCP. She does have some lower abdominal discomfort. She also has fibroids. She has a mother who had ovarian cancer.  Physical examination: HEENT within normal limits. Neck: Thyroid not large. No masses. Supraclavicular nodes: not enlarged. Breasts: Examined in both sitting midline position. No skin changes and no masses. Abdomen: Soft no guarding rebound or masses or hernia. Pelvic: External: Within normal limits. BUS: Within normal limits. Vaginal:within normal limits. Good estrogen effect. No evidence of cystocele rectocele or enterocele. Cervix: clean. Uterus:Enlarged by fibroids to 9-10 weeks size. Adnexa: No masses. Rectovaginal exam: Confirmatory and negative. Extremities: Within normal limits. Wet prep positive for yeast.  Assessment: 1. Pelvic pain 2. Fibroids 3. Yeast vaginitis  Plan: 1. Pelvic ultrasound ordered 2. Terconazole 7 cream.

## 2011-01-24 ENCOUNTER — Telehealth: Payer: Self-pay | Admitting: Internal Medicine

## 2011-01-24 NOTE — Telephone Encounter (Signed)
Pt requesting OV to be seen, Pt advise ED for evaluation, Pt ok.

## 2011-01-24 NOTE — Telephone Encounter (Signed)
Left message to call office

## 2011-01-25 ENCOUNTER — Other Ambulatory Visit (INDEPENDENT_AMBULATORY_CARE_PROVIDER_SITE_OTHER): Payer: BC Managed Care – PPO

## 2011-01-25 ENCOUNTER — Ambulatory Visit (INDEPENDENT_AMBULATORY_CARE_PROVIDER_SITE_OTHER): Payer: BC Managed Care – PPO | Admitting: Endocrinology

## 2011-01-25 ENCOUNTER — Encounter: Payer: Self-pay | Admitting: Endocrinology

## 2011-01-25 VITALS — BP 138/88 | HR 80 | Temp 97.1°F | Ht 66.0 in | Wt 220.4 lb

## 2011-01-25 DIAGNOSIS — E119 Type 2 diabetes mellitus without complications: Secondary | ICD-10-CM

## 2011-01-25 NOTE — Progress Notes (Signed)
Subjective:    Patient ID: Rebekah Kennedy, female    DOB: [birth year], 55 y.o.   MRN: [id number]  HPI pt states she feels well in general.  She has been working hard on her diet.  This caused her to need to reduce her insulin to 150 units daily, for the past week.  no cbg record, but states over the past week, cbg's vary from 120-250.  It is in general higher later in the day.  Past Medical History  Diagnosis Date  . DIABETES MELLITUS, TYPE II 12/01/2006  . HYPERLIPIDEMIA 12/01/2006  . HYPERTENSION 07/07/2006  . DEPRESSION 03/16/2007  . CHEST PAIN 01/23/2007  . ASTHMA NOS W/ACUTE EXACERBATION 07/10/2010  . Obesity   . Splenomegaly     in college  . Dysmenorrhea   . Fibroid     Past Surgical History  Procedure Date  . Appendectomy   . Myomectomy   . Ovarian cyst removal   . 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 Breast surgery     Reduction  . Pelvic laparoscopy 75,88    DL lysis of adhesions  . Dilation and curettage of uterus 1975    DUB    History   Social History  . Marital Status: Single    Spouse Name: N/A    Number of Children: 2  . Years of Education: N/A   Occupational History  . TEACHER    Social History Main Topics  . Smoking status: Never Smoker   . Smokeless tobacco: Never Used  . Alcohol Use: No  . Drug Use: No  . Sexually Active: Yes    Birth Control/ Protection: Post-menopausal   Other Topics Concern  . Not on file   Social History Narrative  . No narrative on file    Current Outpatient Prescriptions on File Prior to Visit  Medication Sig Dispense Refill  . albuterol (VENTOLIN HFA) 108 (90 BASE) MCG/ACT inhaler 1-2 puffs every 4 hours as needed for SOB       . amLODipine (NORVASC) 10 MG tablet Take 10 mg by mouth daily.        Marland Kitchen aspirin 81 MG tablet Take 81 mg by mouth daily.        . budesonide-formoterol (SYMBICORT) 160-4.5 MCG/ACT inhaler Inhale 2 puffs into the lungs 2 (two) times daily.        . Cholecalciferol (VITAMIN D PO) Take by mouth  daily.        Marland Kitchen glucose blood (ONE TOUCH ULTRA TEST) test strip Use as instructed       . insulin detemir (LEVEMIR) 100 UNIT/ML injection Inject 150 Units into the skin every morning.       . montelukast (SINGULAIR) 10 MG tablet 1 tablet by mouth once daily (necessary for asthma control)       . Multiple Vitamin (MULTIVITAMIN) tablet Take 1 tablet by mouth daily.        . quinapril (ACCUPRIL) 40 MG tablet Take 40 mg by mouth at bedtime.        Marland Kitchen spironolactone (ALDACTONE) 25 MG tablet Take 25 mg by mouth 2 (two) times daily.        Marland Kitchen terconazole (TERAZOL 7) 0.4 % vaginal cream Place 1 applicator vaginally at bedtime.  45 g  0  . Insulin Syringe-Needle U-100 (BD INSULIN SYRINGE ULTRAFINE) 31G X 5/16" 1 ML MISC 1 once daily         No Known Allergies  Family History  Problem Relation Age of  Onset  . Cancer Maternal Grandmother     Colon Cancer  . Diabetes Father   . Heart disease Father   . Hypertension Father   . Hypertension Mother   . Heart disease Mother   . Ovarian cancer Mother   . Cancer Mother     Lung cancer  . Diabetes Brother   . Hypertension Brother   . Kidney disease Brother     BP 138/88  Pulse 80  Temp(Src) 97.1 F (36.2 C) (Oral)  Ht 5\' 6"  (1.676 m)  Wt 220 lb 6.4 oz (99.973 kg)  BMI 35.57 kg/m2  SpO2 97%  Review of Systems denies hypoglycemia, since she reduced her insulin.      Objective:   Physical Exam Pulses: dorsalis pedis intact bilat.   Feet: no deformity.  no ulcer on the feet.  feet are of normal color and temp.  no edema Neuro: sensation is intact to touch on the feet  Lab Results  Component Value Date   HGBA1C 8.4* 01/25/2011      Assessment & Plan:  Dm, improved control

## 2011-01-25 NOTE — Patient Instructions (Addendum)
blood tests are being requested for you today.  please call [phone] to hear your test results.  You will be prompted to enter the 9-digit "MRN" number that appears at the top left of this page, followed by #.  Then you will hear the message. pending the test results, please continue 150 units of levemir each morning.   Please make a follow-up appointment in 1 month.  good diet and exercise habits significanly improve the control of your diabetes.  please let me know if you wish to be referred to a dietician.  high blood sugar is very risky to your health.  you should see an eye doctor every year.  controlling your blood pressure and cholesterol drastically reduces the damage diabetes does to your body.  this also applies to quitting smoking.  please discuss these with your doctor.  you should take an aspirin every day, unless you have been advised by a doctor not to.  (update: i left message on phone-tree:  rx as we discussed).

## 2011-02-01 ENCOUNTER — Ambulatory Visit (INDEPENDENT_AMBULATORY_CARE_PROVIDER_SITE_OTHER): Payer: BC Managed Care – PPO | Admitting: Obstetrics and Gynecology

## 2011-02-01 ENCOUNTER — Ambulatory Visit
Admission: RE | Admit: 2011-02-01 | Discharge: 2011-02-01 | Disposition: A | Discharge status: Home / Self Care | Payer: BC Managed Care – PPO | Source: Ambulatory Visit | Attending: Obstetrics and Gynecology | Admitting: Obstetrics and Gynecology

## 2011-02-01 DIAGNOSIS — D259 Leiomyoma of uterus, unspecified: Secondary | ICD-10-CM

## 2011-02-01 DIAGNOSIS — N83209 Unspecified ovarian cyst, unspecified side: Secondary | ICD-10-CM

## 2011-02-01 DIAGNOSIS — N854 Malposition of uterus: Secondary | ICD-10-CM

## 2011-02-01 DIAGNOSIS — N949 Unspecified condition associated with female genital organs and menstrual cycle: Secondary | ICD-10-CM

## 2011-02-01 DIAGNOSIS — N831 Corpus luteum cyst of ovary, unspecified side: Secondary | ICD-10-CM

## 2011-02-01 DIAGNOSIS — D251 Intramural leiomyoma of uterus: Secondary | ICD-10-CM

## 2011-02-01 DIAGNOSIS — D219 Benign neoplasm of connective and other soft tissue, unspecified: Secondary | ICD-10-CM

## 2011-02-01 DIAGNOSIS — R1032 Left lower quadrant pain: Secondary | ICD-10-CM

## 2011-02-01 DIAGNOSIS — B373 Candidiasis of vulva and vagina: Secondary | ICD-10-CM

## 2011-02-01 NOTE — Progress Notes (Signed)
The patient came back to see me today because of lower abdominal discomfort. It is not associated with any bowel symptoms. She has a mother with ovarian cancer. She also tolf me today that her yeast symptoms are much improved after terconazole 7 but did not completely go away. She is a diabetic and her hemoglobin A1c is over 8.  On ultrasound she has a retroverted uterus with 3 small myomas. 2 of them are 2 cm and one is 2-1/2 cm. Her endometrial echo is 2.6 mm. Her right ovary is normal. Although her left ovary is atrophic she does have a 2.1 x 1.1 cm cyst. It has internal low level echoes. It is negative for PFD. Her cul-de-sac is free of fluid.  Assessment: #1. Lower abdominal pain, mild. #2. Fibroids #3. Left ovarian cyst. #4. Yeast vaginitis  Plan: I don't believe any intervention is necessary at the moment. She will let me know if her pain increases. We treated her today with terconazole 3 cream with 2 refills. She will work hard on control and her diabetes. We will do a followup ultrasound for stability of the left cyst in 3 months.

## 2011-02-13 ENCOUNTER — Ambulatory Visit (INDEPENDENT_AMBULATORY_CARE_PROVIDER_SITE_OTHER): Payer: BC Managed Care – PPO | Admitting: Internal Medicine

## 2011-02-13 ENCOUNTER — Other Ambulatory Visit: Payer: Self-pay | Admitting: Internal Medicine

## 2011-02-13 ENCOUNTER — Encounter: Payer: Self-pay | Admitting: Internal Medicine

## 2011-02-13 VITALS — BP 132/84 | HR 82 | Temp 98.3°F | Resp 14 | Wt 211.4 lb

## 2011-02-13 DIAGNOSIS — Z8673 Personal history of transient ischemic attack (TIA), and cerebral infarction without residual deficits: Secondary | ICD-10-CM | POA: Insufficient documentation

## 2011-02-13 DIAGNOSIS — E785 Hyperlipidemia, unspecified: Secondary | ICD-10-CM

## 2011-02-13 DIAGNOSIS — G459 Transient cerebral ischemic attack, unspecified: Secondary | ICD-10-CM

## 2011-02-13 NOTE — Assessment & Plan Note (Addendum)
Patient with multiple cardiovascular risk factors and a history consistent with a TIA. EKG nsr  Increase aspirin to 325 Labs ECHO- carotid ultrasound-MRI ER if symptoms resurface Neurology referral, Plavix? Explained to patient how important it is to have her diabetes, cholesterol hypertension well controlled. Encouraged to keep working with her endocrinologist in reference to her A1c. Followup with PCP in 2 months

## 2011-02-13 NOTE — Patient Instructions (Addendum)
Please came back fasting: CMP-CBC--- dx TIA FLP ----dx hyperlipidemia  increase Aspirin 325 mg 1 a day ER is symptoms resurface  Came back in 2 months to see Dr Alwyn Ren

## 2011-02-13 NOTE — Progress Notes (Signed)
  Subjective:    Patient ID: Rebekah Kennedy, female    DOB: 12/11/1955, 55 y.o.   MRN: [id number]  HPI Acute visit On 01-23-11 she suddenly developed difficulty with her speech, both legs felt heavy, felt imbalance, she has some left perioral numbness. She did not check her blood sugar at the time but she felt slightly better after she ate. Did not loss consciousness then. She called here, was directed to the ED, she did not go. It took 2 days for her speech to go back to normal. The peri-oral numbness quickly resolved. She did not notice any focal deficits. Still having some difficulty with balance and dizziness, mostly when she moves around. Yesterday her legs gave up and she failed. No major injury.  Past Medical History  Diagnosis Date  . DIABETES MELLITUS, TYPE II 12/01/2006  . HYPERLIPIDEMIA 12/01/2006  . HYPERTENSION 07/07/2006  . DEPRESSION 03/16/2007  . CHEST PAIN 01/23/2007  . ASTHMA NOS W/ACUTE EXACERBATION 07/10/2010  . Obesity   . Splenomegaly     in college  . Dysmenorrhea   . Fibroid    Past Surgical History  Procedure Date  . Appendectomy   . Myomectomy   . Ovarian cyst removal   . 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 Breast surgery     Reduction  . Pelvic laparoscopy 75,88    DL lysis of adhesions  . Dilation and curettage of uterus 1975    DUB   History   Social History  . Marital Status: Single    Spouse Name: N/A    Number of Children: 2  . Years of Education: N/A   Occupational History  . TEACHER    Social History Main Topics  . Smoking status: Never Smoker   . Smokeless tobacco: Never Used  . Alcohol Use: No  . Drug Use: No  . Sexually Active: Yes    Birth Control/ Protection: Post-menopausal   Other Topics Concern  . Not on file   Social History Narrative  . No narrative on file     Review of Systems Denies any headaches or diplopia No nausea or vomiting No chest pain, palpitations, lower extremity edema. BP at home ok when  checked Medication list reviewed, she is taking only 1 Toprol a day (as opposed to take 2 a day according to the medication list)    Objective:   Physical Exam  Constitutional: She appears well-developed. No distress.  HENT:  Head: Normocephalic and atraumatic.  Neck: No thyromegaly present.       Normal carotid pulse  Cardiovascular: Normal rate and normal heart sounds.   No murmur heard. Pulmonary/Chest: Effort normal and breath sounds normal. No respiratory distress. She has no wheezes. She has no rales.  Musculoskeletal: She exhibits no edema.  Neurological:       Speech is fluent, memory  normal. Face is symmetric except for a subtle decreased in the L nasolabial fold. EOMI, PERLA Motor symmetric, DTRs symmetric, Romberg absent.   Skin: Skin is warm and dry. She is not diaphoretic.  Psychiatric: She has a normal mood and affect. Her behavior is normal. Judgment and thought content normal.          Assessment & Plan:

## 2011-02-14 ENCOUNTER — Ambulatory Visit: Payer: BC Managed Care – PPO

## 2011-02-14 ENCOUNTER — Encounter: Payer: Self-pay | Admitting: Neurology

## 2011-02-14 ENCOUNTER — Telehealth: Payer: Self-pay | Admitting: Internal Medicine

## 2011-02-14 DIAGNOSIS — G459 Transient cerebral ischemic attack, unspecified: Secondary | ICD-10-CM

## 2011-02-14 DIAGNOSIS — E785 Hyperlipidemia, unspecified: Secondary | ICD-10-CM

## 2011-02-14 LAB — CBC WITH DIFFERENTIAL/PLATELET
Basophils Relative: 0.9 % (ref 0.0–3.0)
Eosinophils Relative: 4.6 % (ref 0.0–5.0)
HCT: 44.1 % (ref 36.0–46.0)
Hemoglobin: 14.6 g/dL (ref 12.0–15.0)
Lymphs Abs: 3 10*3/uL (ref 0.7–4.0)
MCV: 84.9 fl (ref 78.0–100.0)
Monocytes Absolute: 0.5 10*3/uL (ref 0.1–1.0)
Monocytes Relative: 5.7 % (ref 3.0–12.0)
Neutro Abs: 5.4 10*3/uL (ref 1.4–7.7)
WBC: 9.5 10*3/uL (ref 4.5–10.5)

## 2011-02-14 LAB — COMPREHENSIVE METABOLIC PANEL
AST: 19 U/L (ref 0–37)
BUN: 15 mg/dL (ref 6–23)
Calcium: 9.7 mg/dL (ref 8.4–10.5)
Chloride: 102 mEq/L (ref 96–112)
Creatinine, Ser: 0.7 mg/dL (ref 0.4–1.2)
GFR: 117.74 mL/min (ref >60.00)

## 2011-02-14 LAB — LIPID PANEL: Total CHOL/HDL Ratio: 4

## 2011-02-14 NOTE — Telephone Encounter (Signed)
Office closed, will call again tomorrow.

## 2011-02-14 NOTE — Telephone Encounter (Signed)
Orders for MRA/MRI Brain & Head were not approved by phone with [employer], after providing all details from office notes.  I have also faxed notes & labs to Physician reviewer, fax [phone].    For Peer to Peer review, Dr. Drue Novel call [phone], Option 2, give patient's Insurance ID [id number], as the case number.  Case will remain open for 3 business days.

## 2011-02-15 ENCOUNTER — Other Ambulatory Visit (HOSPITAL_COMMUNITY): Payer: BC Managed Care – PPO | Admitting: Radiology

## 2011-02-15 NOTE — Telephone Encounter (Signed)
Case approved [id number] Facility is [facility]

## 2011-02-16 ENCOUNTER — Ambulatory Visit
Admission: RE | Admit: 2011-02-16 | Discharge: 2011-02-16 | Disposition: A | Discharge status: Home / Self Care | Payer: BC Managed Care – PPO | Source: Ambulatory Visit | Attending: Internal Medicine | Admitting: Internal Medicine

## 2011-02-16 ENCOUNTER — Telehealth: Payer: Self-pay | Admitting: Internal Medicine

## 2011-02-16 DIAGNOSIS — I639 Cerebral infarction, unspecified: Secondary | ICD-10-CM

## 2011-02-16 NOTE — Telephone Encounter (Signed)
Spoke with the patient, aware of the MRI results. Since she last saw me she is feeling well, no new symptoms, still slightly dizzy. I strongly advise her to keep her appointment with Dr. Modesto Charon Will schedule her neck MRA

## 2011-02-16 NOTE — Telephone Encounter (Signed)
Phone call from radiology, MRI showed 2 acute strokes. Case discussed with neurology , in addition to the current workup we need to add MRA of the neck. Office visit with neurology pending. Will also instruct the patient to go to the ER should she develop any other symptoms. Aspirin was increased from 81 to 325 Called pt to Home-work-mobole: unable to communicate, will try again

## 2011-02-17 ENCOUNTER — Telehealth: Payer: Self-pay | Admitting: Internal Medicine

## 2011-02-17 NOTE — Telephone Encounter (Signed)
Advised patient, her cholesterol is elevated, LDL is 117, goal is < 70. Plan: Start Lipitor 40 mg one by mouth each bedtime, call in a prescription. Other labs wnl

## 2011-02-19 MED ORDER — ATORVASTATIN CALCIUM 40 MG PO TABS: 40.0000 mg | ORAL_TABLET | Freq: Every day | ORAL | Status: DC

## 2011-02-19 NOTE — Telephone Encounter (Signed)
PATIENT INFORMED. RX SENT TO PHARMACY. RESULTS MAILED.

## 2011-02-20 ENCOUNTER — Inpatient Hospital Stay: Admission: RE | Admit: 2011-02-20 | Payer: BC Managed Care – PPO | Source: Ambulatory Visit

## 2011-02-21 ENCOUNTER — Other Ambulatory Visit: Payer: Self-pay | Admitting: Internal Medicine

## 2011-02-21 ENCOUNTER — Other Ambulatory Visit (HOSPITAL_COMMUNITY): Payer: BC Managed Care – PPO | Admitting: Radiology

## 2011-02-21 LAB — GLUCOSE, CAPILLARY: Glucose-Capillary: 204 — ABNORMAL HIGH

## 2011-02-22 ENCOUNTER — Ambulatory Visit (INDEPENDENT_AMBULATORY_CARE_PROVIDER_SITE_OTHER): Payer: BC Managed Care – PPO | Admitting: *Deleted

## 2011-02-22 ENCOUNTER — Ambulatory Visit (HOSPITAL_COMMUNITY): Payer: BC Managed Care – PPO | Attending: Internal Medicine

## 2011-02-22 ENCOUNTER — Other Ambulatory Visit: Payer: Self-pay | Admitting: Internal Medicine

## 2011-02-22 DIAGNOSIS — E669 Obesity, unspecified: Secondary | ICD-10-CM | POA: Insufficient documentation

## 2011-02-22 DIAGNOSIS — G459 Transient cerebral ischemic attack, unspecified: Secondary | ICD-10-CM

## 2011-02-22 DIAGNOSIS — E119 Type 2 diabetes mellitus without complications: Secondary | ICD-10-CM | POA: Insufficient documentation

## 2011-02-22 DIAGNOSIS — R42 Dizziness and giddiness: Secondary | ICD-10-CM | POA: Insufficient documentation

## 2011-02-22 DIAGNOSIS — R079 Chest pain, unspecified: Secondary | ICD-10-CM | POA: Insufficient documentation

## 2011-02-22 DIAGNOSIS — E785 Hyperlipidemia, unspecified: Secondary | ICD-10-CM | POA: Insufficient documentation

## 2011-02-25 ENCOUNTER — Ambulatory Visit
Admission: RE | Admit: 2011-02-25 | Discharge: 2011-02-25 | Disposition: A | Discharge status: Home / Self Care | Payer: BC Managed Care – PPO | Source: Ambulatory Visit | Attending: Internal Medicine | Admitting: Internal Medicine

## 2011-02-25 ENCOUNTER — Telehealth: Payer: Self-pay | Admitting: Internal Medicine

## 2011-02-25 DIAGNOSIS — I639 Cerebral infarction, unspecified: Secondary | ICD-10-CM

## 2011-02-25 MED ORDER — GADOBENATE DIMEGLUMINE 529 MG/ML IV SOLN
20.0000 mL | Freq: Once | INTRAVENOUS | Status: AC | PRN
  Administered 2011-02-25: 20 mL via INTRAVENOUS

## 2011-02-25 MED ORDER — GADOBENATE DIMEGLUMINE 529 MG/ML IV SOLN: 20.0000 mL | Freq: Once | INTRAVENOUS | Status: DC | PRN

## 2011-02-25 NOTE — Telephone Encounter (Signed)
Advise patient: ECHO showed a normal function of the heart but  may need further testing (bubble study) Carotid u/s showed no problems  Had a incidental thyroid nodule, will need a thyroid u/s in few months once al neuro w/u is done

## 2011-02-26 NOTE — Telephone Encounter (Signed)
LMOM to inform patient. 

## 2011-02-28 LAB — I-STAT 8, (EC8 V) (CONVERTED LAB)
Acid-Base Excess: 1
Chloride: 109
HCT: 47 — ABNORMAL HIGH
Operator id: 114531
Potassium: 5.1
TCO2: 23
pCO2, Ven: 26.2 — ABNORMAL LOW

## 2011-02-28 NOTE — Telephone Encounter (Signed)
Spoke with patient to go over results once more.  Patient is requesting results of MRA Neck that she had done on Sunday 02/25/11. Please advise.

## 2011-02-28 NOTE — Telephone Encounter (Signed)
Advise patient, test essentially normal,   question of an artifact, results will be reviewed by neurology.

## 2011-03-01 ENCOUNTER — Ambulatory Visit (INDEPENDENT_AMBULATORY_CARE_PROVIDER_SITE_OTHER): Payer: BC Managed Care – PPO | Admitting: Endocrinology

## 2011-03-01 ENCOUNTER — Ambulatory Visit (INDEPENDENT_AMBULATORY_CARE_PROVIDER_SITE_OTHER): Payer: BC Managed Care – PPO | Admitting: Neurology

## 2011-03-01 ENCOUNTER — Encounter: Payer: Self-pay | Admitting: Endocrinology

## 2011-03-01 ENCOUNTER — Encounter: Payer: Self-pay | Admitting: Neurology

## 2011-03-01 VITALS — BP 124/74 | HR 87 | Temp 98.7°F | Ht 65.0 in | Wt 213.0 lb

## 2011-03-01 VITALS — BP 160/90 | HR 92 | Ht 64.5 in | Wt 212.0 lb

## 2011-03-01 DIAGNOSIS — E119 Type 2 diabetes mellitus without complications: Secondary | ICD-10-CM

## 2011-03-01 DIAGNOSIS — I6350 Cerebral infarction due to unspecified occlusion or stenosis of unspecified cerebral artery: Secondary | ICD-10-CM

## 2011-03-01 DIAGNOSIS — Z23 Encounter for immunization: Secondary | ICD-10-CM

## 2011-03-01 DIAGNOSIS — I639 Cerebral infarction, unspecified: Secondary | ICD-10-CM

## 2011-03-01 MED ORDER — CLOPIDOGREL BISULFATE 75 MG PO TABS: 75.0000 mg | ORAL_TABLET | Freq: Every day | ORAL | Status: AC

## 2011-03-01 MED ORDER — INSULIN DETEMIR 100 UNIT/ML ~~LOC~~ SOLN: 160.0000 [IU] | SUBCUTANEOUS | Status: DC

## 2011-03-01 NOTE — Patient Instructions (Addendum)
please increase levemir to 160 units of levemir each morning.   Please make a follow-up appointment in January. check your blood sugar 2 times a day.  vary the time of day when you check, between before the 3 meals, and at bedtime.  also check if you have symptoms of your blood sugar being too high or too low.  please keep a record of the readings and bring it to your next appointment here.  please call us sooner if you are having low blood sugar episodes, or if it stays over 200.

## 2011-03-01 NOTE — Progress Notes (Signed)
Dear Dr. Alwyn Ren,  Thank you for having me see Rebekah Kennedy in consultation today at [facility] for her problem with ischemic stroke.  As you may recall, she is a 55 y.o. year old female with a history of diabetes, hypertension and hyperlipidemia who developed sudden onset of difficulty speaking, blurriness of vision, left sided weakness on October 5th.  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  MRA head and neck were unremarkable except for the takeoff of the right vert which was not visualized.  She has had gradual improvement of her symptoms.  At that time she was one aspirin 81mg  and this was increase to 325 mg.  An ECHO revealed an atrial septal aneurysm.  Past Medical History  Diagnosis Date  . DIABETES MELLITUS, TYPE II 12/01/2006  . HYPERLIPIDEMIA 12/01/2006  . HYPERTENSION 07/07/2006  . DEPRESSION 03/16/2007  . CHEST PAIN 01/23/2007  . ASTHMA NOS W/ACUTE EXACERBATION 07/10/2010  . Obesity   . Splenomegaly     in college  . Dysmenorrhea   . Fibroid     Past Surgical History  Procedure Date  . Appendectomy   . Myomectomy   . Ovarian cyst removal   . 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 Breast surgery     Reduction  . Pelvic laparoscopy 75,88    DL lysis of adhesions  . Dilation and curettage of uterus 1975    DUB    History   Social History  . Marital Status: Single    Spouse Name: N/A    Number of Children: 2  . Years of Education: N/A   Occupational History  . TEACHER    Social History Main Topics  . Smoking status: Never Smoker   . Smokeless tobacco: Never Used  . Alcohol Use: No  . Drug Use: No  . Sexually Active: Yes    Birth Control/ Protection: Post-menopausal   Other Topics Concern  . None   Social History Narrative  . None    Family History  Problem Relation Age of Onset  . Cancer Maternal Grandmother     Colon Cancer  . Diabetes Father   . Heart disease Father     . Hypertension Father   . Hypertension Mother   . Heart disease Mother   . Ovarian cancer Mother   . Cancer Mother     Lung cancer  . Diabetes Brother   . Hypertension Brother   . Kidney disease Brother    Father had fatal stroke in his 58s.   ROS:  13 systems were reviewed and are notable for continued difficulty with speaking and left sided weakness.  All other review of systems are unremarkable.   Examination:  Filed Vitals:   03/01/11 0932  BP: 160/90  Pulse: 92  Height: 5' 4.5" (1.638 m)  Weight: 212 lb (96.163 kg)     In general, well appearing older woman.  Cardiovascular: The patient has a regular rate and rhythm and no carotid bruits.  Fundoscopy:  Disks are flat. Vessel caliber within normal limits.  Mental status:   The patient is oriented to person, place and time. Recent and remote memory are intact. Attention span and concentration are normal. Language including repetition, naming, following commands are intact. Fund of knowledge of current and historical events, as well as vocabulary are normal.  Cranial Nerves: Pupils are equally round and reactive to light. Visual fields full  to confrontation. Extraocular movements are intact without nystagmus. Facial sensation and muscles of mastication are intact. Muscles of facial expression are symmetric. Hearing intact to bilateral finger rub. Tongue protrusion, uvula, palate midline.  Shoulder shrug delayed on the left.  Motor:  Mild left pronator drift.  There are no adventitious movements.  Mild left upper extremity and lower extremity weakness, with bradykinesia of fine finger movements.  Reflexes:   Biceps  Triceps Brachioradialis Knee Ankle  Right 1+  1+  1+   1+ 1+  Left  2+  2+  2+   2+ 2+  Toes down  Coordination:  Dysmetric on F2N on left.  Sensation is intact to temperature and vibration.  Gait and Station reveal left sided weakness as well as slight ataxia.  Tandem gait impaired.  Romberg is  negative  MRI brain was reviewed as well as MRA of the head and neck.  Remarkable for strokes as described above and non-visualized takeoff of the right vert.   Impression: Ataxia-hemiparesis syndrome.  I think this was a small vessel stroke, likely lacunar.  It is less likely to be embolic, particularly cardioembolic given is location and single focus.  While ASAs are associated in case-control studies with stroke and they are commonly associated with PFOs she does not have any risk factors for DVT and there is nothing to suggest a cardioembolic source on MRI.   Recommendations: I would recommend switching her to Plavix.  Likely 81mg  of aspirin and 325mg  of aspirin function the same, and there is decent evidence that clopidogrel does a slightly better job at secondary stroke prevention.  I am also going to get a CTA of the neck to visualized her right vert properly.  We will also send her to physical therapy and give her a temporary parking placard.     We will see the patient back in 6 weeks.  Thank you for having [place] see Rebekah Kennedy in consultation.  Feel free to contact me with any questions.  Lupita Raider Modesto Charon, MD [facility], [place] [address] [place], [place] [address] Phone: [phone] Fax: [phone].

## 2011-03-01 NOTE — Telephone Encounter (Signed)
LMOM to inform patient. 

## 2011-03-01 NOTE — Patient Instructions (Signed)
Your radiology test is scheduled for Tuesday, October 23rd at 8:30am.  Please arrive to [facility] by 8:15am.  Nothing to eat or drink after midnight.  [phone].  Your first physical therapy appointment is Wednesday, October 17th at 8:30am.  [phone]  [address] [place], [place] [address]

## 2011-03-01 NOTE — Progress Notes (Signed)
Subjective:    Patient ID: Rebekah Kennedy, female    DOB: 06-04-1955, 55 y.o.   MRN: [id number]  HPI Pt says in retrospect, the episode of mild hypoglycemia she has in early September was determined to be a [place] rather than hypoglycemia.  no cbg record, but states cbg's vary from 120-270.  There is no trend throughout the day. Past Medical History  Diagnosis Date  . DIABETES MELLITUS, TYPE II 12/01/2006  . HYPERLIPIDEMIA 12/01/2006  . HYPERTENSION 07/07/2006  . DEPRESSION 03/16/2007  . CHEST PAIN 01/23/2007  . ASTHMA NOS W/ACUTE EXACERBATION 07/10/2010  . Obesity   . Splenomegaly     in college  . Dysmenorrhea   . Fibroid   . TIA (transient ischemic attack) 2012    Past Surgical History  Procedure Date  . Appendectomy   . Myomectomy   . Ovarian cyst removal   . 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 Breast surgery     Reduction  . Pelvic laparoscopy 75,88    DL lysis of adhesions  . Dilation and curettage of uterus 1975    DUB    History   Social History  . Marital Status: Single    Spouse Name: N/A    Number of Children: 2  . Years of Education: N/A   Occupational History  . TEACHER    Social History Main Topics  . Smoking status: Never Smoker   . Smokeless tobacco: Never Used  . Alcohol Use: No  . Drug Use: No  . Sexually Active: Yes    Birth Control/ Protection: Post-menopausal   Other Topics Concern  . Not on file   Social History Narrative  . No narrative on file    Current Outpatient Prescriptions on File Prior to Visit  Medication Sig Dispense Refill  . albuterol (VENTOLIN HFA) 108 (90 BASE) MCG/ACT inhaler 1-2 puffs every 4 hours as needed for SOB       . amLODipine (NORVASC) 10 MG tablet TAKE ONE TABLET BY MOUTH ONE TIME DAILY  30 tablet  5  . aspirin 325 MG tablet Take 325 mg by mouth daily.        Marland Kitchen atorvastatin (LIPITOR) 40 MG tablet Take 1 tablet (40 mg total) by mouth at bedtime.  30 tablet  5  . budesonide-formoterol (SYMBICORT) 160-4.5 MCG/ACT  inhaler Inhale 2 puffs into the lungs 2 (two) times daily.        . Cholecalciferol (VITAMIN D PO) Take by mouth daily.        Marland Kitchen glucose blood (ONE TOUCH ULTRA TEST) test strip Use as instructed       . insulin detemir (LEVEMIR) 100 UNIT/ML injection Inject 150 Units into the skin every morning.       . Insulin Syringe-Needle U-100 (BD INSULIN SYRINGE ULTRAFINE) 31G X 5/16" 1 ML MISC 1 once daily       . metoprolol (TOPROL-XL) 100 MG 24 hr tablet Take 100 mg by mouth daily.       . montelukast (SINGULAIR) 10 MG tablet 1 tablet by mouth once daily (necessary for asthma control)       . Multiple Vitamin (MULTIVITAMIN) tablet Take 1 tablet by mouth daily.        . quinapril (ACCUPRIL) 40 MG tablet Take 40 mg by mouth at bedtime.        Marland Kitchen spironolactone (ALDACTONE) 25 MG tablet Take 25 mg by mouth 2 (two) times daily.  No Known Allergies  Family History  Problem Relation Age of Onset  . Cancer Maternal Grandmother     Colon Cancer  . Diabetes Father   . Heart disease Father   . Hypertension Father   . Hypertension Mother   . Heart disease Mother   . Ovarian cancer Mother   . Cancer Mother     Lung cancer  . Diabetes Brother   . Hypertension Brother   . Kidney disease Brother     BP 124/74  Pulse 87  Temp(Src) 98.7 F (37.1 C) (Oral)  Ht 5\' 5"  (1.651 m)  Wt 213 lb (96.616 kg)  BMI 35.44 kg/m2  SpO2 97%   Review of Systems Denies weight change    Objective:   Physical Exam VITAL SIGNS:  See vs page GENERAL: no distress.  obese SKIN:  Insulin injection sites at the anterior abdomen are normal      Assessment & Plan:  Dn, therapy limited by pt's need for a simple regimen

## 2011-03-07 ENCOUNTER — Ambulatory Visit: Payer: BC Managed Care – PPO | Attending: Neurology | Admitting: *Deleted

## 2011-03-07 DIAGNOSIS — R5381 Other malaise: Secondary | ICD-10-CM | POA: Insufficient documentation

## 2011-03-07 DIAGNOSIS — R262 Difficulty in walking, not elsewhere classified: Secondary | ICD-10-CM | POA: Insufficient documentation

## 2011-03-07 DIAGNOSIS — M6281 Muscle weakness (generalized): Secondary | ICD-10-CM | POA: Insufficient documentation

## 2011-03-07 DIAGNOSIS — IMO0001 Reserved for inherently not codable concepts without codable children: Secondary | ICD-10-CM | POA: Insufficient documentation

## 2011-03-07 DIAGNOSIS — R279 Unspecified lack of coordination: Secondary | ICD-10-CM | POA: Insufficient documentation

## 2011-03-08 ENCOUNTER — Encounter: Payer: BC Managed Care – PPO | Admitting: Cardiology

## 2011-03-13 ENCOUNTER — Inpatient Hospital Stay (HOSPITAL_COMMUNITY): Admission: RE | Admit: 2011-03-13 | Payer: BC Managed Care – PPO | Source: Ambulatory Visit

## 2011-03-14 ENCOUNTER — Ambulatory Visit: Payer: BC Managed Care – PPO | Admitting: Physical Therapy

## 2011-03-14 ENCOUNTER — Encounter: Payer: BC Managed Care – PPO | Admitting: Cardiology

## 2011-03-15 ENCOUNTER — Ambulatory Visit (HOSPITAL_COMMUNITY)
Admission: RE | Admit: 2011-03-15 | Discharge: 2011-03-15 | Disposition: A | Discharge status: Home / Self Care | Payer: BC Managed Care – PPO | Source: Ambulatory Visit | Attending: Neurology | Admitting: Neurology

## 2011-03-15 DIAGNOSIS — R599 Enlarged lymph nodes, unspecified: Secondary | ICD-10-CM | POA: Insufficient documentation

## 2011-03-15 DIAGNOSIS — I639 Cerebral infarction, unspecified: Secondary | ICD-10-CM

## 2011-03-15 DIAGNOSIS — E041 Nontoxic single thyroid nodule: Secondary | ICD-10-CM | POA: Insufficient documentation

## 2011-03-15 DIAGNOSIS — I6529 Occlusion and stenosis of unspecified carotid artery: Secondary | ICD-10-CM | POA: Insufficient documentation

## 2011-03-15 MED ORDER — IOHEXOL 350 MG/ML SOLN
100.0000 mL | Freq: Once | INTRAVENOUS | Status: AC | PRN
  Administered 2011-03-15: 100 mL via INTRAVENOUS

## 2011-03-16 ENCOUNTER — Telehealth: Payer: Self-pay | Admitting: Neurology

## 2011-03-16 NOTE — Telephone Encounter (Signed)
Pt would like results of MRI done yesterday (03/15/2011)

## 2011-03-19 ENCOUNTER — Ambulatory Visit: Payer: BC Managed Care – PPO | Admitting: Physical Therapy

## 2011-03-20 ENCOUNTER — Other Ambulatory Visit: Payer: Self-pay | Admitting: Internal Medicine

## 2011-03-20 DIAGNOSIS — E041 Nontoxic single thyroid nodule: Secondary | ICD-10-CM

## 2011-03-20 NOTE — Telephone Encounter (Signed)
Spoke to patient that CTA revealed normal vertebral origins bilaterally.  However, thyroid nodule was seen on the CT.  Dr. Alwyn Ren - Patient is going to follow up with you.  Would you be able to arrange an U/S of her thyroid if that makes sense to you.  Thanks!  Matt

## 2011-03-21 ENCOUNTER — Ambulatory Visit: Payer: BC Managed Care – PPO | Admitting: Physical Therapy

## 2011-03-21 ENCOUNTER — Other Ambulatory Visit (INDEPENDENT_AMBULATORY_CARE_PROVIDER_SITE_OTHER): Payer: BC Managed Care – PPO

## 2011-03-21 ENCOUNTER — Other Ambulatory Visit: Payer: Self-pay | Admitting: Internal Medicine

## 2011-03-21 DIAGNOSIS — E041 Nontoxic single thyroid nodule: Secondary | ICD-10-CM

## 2011-03-21 LAB — T4, FREE: Free T4: 0.91 ng/dL (ref 0.60–1.60)

## 2011-03-21 LAB — T3, FREE: T3, Free: 3.1 pg/mL (ref 2.3–4.2)

## 2011-03-22 ENCOUNTER — Ambulatory Visit: Payer: BC Managed Care – PPO | Attending: Neurology | Admitting: Physical Therapy

## 2011-03-22 DIAGNOSIS — R279 Unspecified lack of coordination: Secondary | ICD-10-CM | POA: Insufficient documentation

## 2011-03-22 DIAGNOSIS — R5381 Other malaise: Secondary | ICD-10-CM | POA: Insufficient documentation

## 2011-03-22 DIAGNOSIS — R262 Difficulty in walking, not elsewhere classified: Secondary | ICD-10-CM | POA: Insufficient documentation

## 2011-03-22 DIAGNOSIS — IMO0001 Reserved for inherently not codable concepts without codable children: Secondary | ICD-10-CM | POA: Insufficient documentation

## 2011-03-22 DIAGNOSIS — M6281 Muscle weakness (generalized): Secondary | ICD-10-CM | POA: Insufficient documentation

## 2011-03-23 ENCOUNTER — Encounter: Payer: Self-pay | Admitting: *Deleted

## 2011-03-26 ENCOUNTER — Ambulatory Visit: Payer: BC Managed Care – PPO | Admitting: Physical Therapy

## 2011-03-27 ENCOUNTER — Ambulatory Visit
Admission: RE | Admit: 2011-03-27 | Discharge: 2011-03-27 | Disposition: A | Discharge status: Home / Self Care | Payer: BC Managed Care – PPO | Source: Ambulatory Visit | Attending: Internal Medicine | Admitting: Internal Medicine

## 2011-03-27 DIAGNOSIS — E041 Nontoxic single thyroid nodule: Secondary | ICD-10-CM

## 2011-03-28 ENCOUNTER — Ambulatory Visit: Payer: BC Managed Care – PPO | Admitting: Physical Therapy

## 2011-03-29 ENCOUNTER — Telehealth: Payer: Self-pay

## 2011-03-29 NOTE — Telephone Encounter (Signed)
Left message on voicemail for patient to return call to discuss report as recommended by Dr.Hopper

## 2011-03-29 NOTE — Telephone Encounter (Signed)
Message copied by Edgardo Roys on Thu Mar 29, 2011  5:40 PM ------      Message from: Pecola Lawless      Created: Wed Mar 28, 2011  6:33 PM       Appt needed to examine thyroid & discuss options.

## 2011-03-30 ENCOUNTER — Ambulatory Visit (INDEPENDENT_AMBULATORY_CARE_PROVIDER_SITE_OTHER): Payer: BC Managed Care – PPO | Admitting: Internal Medicine

## 2011-03-30 ENCOUNTER — Encounter: Payer: Self-pay | Admitting: Internal Medicine

## 2011-03-30 DIAGNOSIS — Z8673 Personal history of transient ischemic attack (TIA), and cerebral infarction without residual deficits: Secondary | ICD-10-CM | POA: Insufficient documentation

## 2011-03-30 DIAGNOSIS — G463 Brain stem stroke syndrome: Secondary | ICD-10-CM

## 2011-03-30 DIAGNOSIS — I6789 Other cerebrovascular disease: Secondary | ICD-10-CM

## 2011-03-30 DIAGNOSIS — E042 Nontoxic multinodular goiter: Secondary | ICD-10-CM

## 2011-03-30 NOTE — Telephone Encounter (Signed)
Patient was here today for appointment

## 2011-03-30 NOTE — Patient Instructions (Addendum)
Please consider the options as we discussed. If Dr. Modesto Charon allows, you probably  would have to be off the Plavix for the thyroid fine needle aspiration. Any "dominant" ( size > 1 cm ) thyroid nodule is usually biopsied under Xray guidance (Ultrasound)  with a very fine needle. The alternative approach would be monitoring with Ultrasound in 6 months . Please go to [facility] to review "Thyroid Nodule".  I recommend the biopsy to determine the exact  nature of these nodules.

## 2011-03-30 NOTE — Progress Notes (Signed)
  Subjective:    Patient ID: Rebekah Kennedy, female    DOB: September 13, 1955, 55 y.o.   MRN: [id number]  HPI She returns to discuss the incidentally noted thyroid nodules found during the neurologic evaluation. She has no past medical history of thyroid disease. Her sister and her paternal grandmother had Graves' disease.  The thyroid is asymmetric with the left lobe larger than the right. A nodule was present in the right mid lobe 14 x 14 x 18 mm and on the left inferiorly 16 x 22 x 33 mm. The right lobe nodule is solid; the left is mostly solid but complex. Her thyroid function tests are normal   Review of Systems  Constitutional: Weight change: down on purpose; Fatigue:no; Sleep pattern:good; Appetite:good  Visual change(blurred/diplopia/visual loss):blurred occasionally Hoarseness:no; Swallowing issues:no Cardiovascular: Palpitations:no; Racing:no; Irregularity:no GI: Constipation:no; Diarrhea:no Derm: Change in nails/hair/skin:nails brittle Neuro: Numbness/tingling:intermittent lip numbness; Tremor:no Psych: Anxiety:no; Depression:some Endo: Temperature intolerance: Heat:no; Cold:no       Objective:   Physical Exam  Gen.:  well-nourished; in no acute distress Eyes: Extraocular motion intact; no lid lag or proptosis Neck: full ROM ; the left lobe of the thyroid is slightly larger than the right to palpation. I can appreciate no nodules. Heart: Normal rhythm and rate without significant murmur, gallop.S 4 Lungs: Chest clear to auscultation without rales,rales, wheezes Neuro:Deep tendon reflexes are equal and within normal limits; no tremor  Skin: Warm and dry without significant lesions or rashes; no onycholysis Psych: Normally communicative and interactive; no abnormal mood or affect clinically.         Assessment & Plan:  #1 bilateral significant solid thyroid nodules; asymptomatic. Thyroid function is normal.   Plan: Risks and options discussed. Significantly she is on Plavix;  this would have to be discontinued for fine needle aspiration this is pursued. Both lesions should be biopsied

## 2011-04-02 ENCOUNTER — Other Ambulatory Visit: Payer: Self-pay | Admitting: Internal Medicine

## 2011-04-02 ENCOUNTER — Ambulatory Visit: Payer: BC Managed Care – PPO | Admitting: Physical Therapy

## 2011-04-03 MED ORDER — METOPROLOL SUCCINATE ER 100 MG PO TB24: 100.0000 mg | ORAL_TABLET | Freq: Every day | ORAL | Status: DC

## 2011-04-03 MED ORDER — SPIRONOLACTONE 25 MG PO TABS: 25.0000 mg | ORAL_TABLET | Freq: Two times a day (BID) | ORAL | Status: DC

## 2011-04-03 NOTE — Telephone Encounter (Signed)
RX sent

## 2011-04-04 ENCOUNTER — Ambulatory Visit: Payer: BC Managed Care – PPO | Admitting: *Deleted

## 2011-04-09 ENCOUNTER — Ambulatory Visit: Payer: BC Managed Care – PPO | Admitting: Physical Therapy

## 2011-04-10 ENCOUNTER — Ambulatory Visit: Payer: BC Managed Care – PPO | Admitting: Physical Therapy

## 2011-04-11 ENCOUNTER — Ambulatory Visit: Payer: BC Managed Care – PPO | Admitting: Physical Therapy

## 2011-04-17 ENCOUNTER — Ambulatory Visit: Payer: BC Managed Care – PPO | Admitting: Physical Therapy

## 2011-04-18 ENCOUNTER — Encounter: Payer: Self-pay | Admitting: Neurology

## 2011-04-18 ENCOUNTER — Ambulatory Visit (INDEPENDENT_AMBULATORY_CARE_PROVIDER_SITE_OTHER): Payer: BC Managed Care – PPO | Admitting: Neurology

## 2011-04-18 VITALS — BP 148/84 | HR 88 | Wt 208.9 lb

## 2011-04-18 DIAGNOSIS — G463 Brain stem stroke syndrome: Secondary | ICD-10-CM

## 2011-04-18 DIAGNOSIS — I6789 Other cerebrovascular disease: Secondary | ICD-10-CM

## 2011-04-18 NOTE — Progress Notes (Signed)
Dear Dr. Alwyn Ren,  I saw  Rebekah Kennedy back in [facility] for her problem with a right pontine ischemic stroke.  As you may recall, she is a 55 y.o. year old female with a history of diabetes, hyperlipidemia, hypertension who developed acute left sided weakness and difficulty walking.  She was found on MRI brain to have a right pontine stroke.  In order to better evaluate her posterior circulation I got a CTA of her head and neck which did not show any significant vertebral disease.  I did recommend she switch to clopidogrel.   Since that time she has not had any further events.  On CTA she was diagnosed with a thyroid nodule and is due to get a FNA.  She feels her left sided weakness continues to improve and she gets therapy for it.   Medical history, social history, and family history were reviewed and have not changed since the last clinic visit.  Current Outpatient Prescriptions on File Prior to Visit  Medication Sig Dispense Refill  . albuterol (VENTOLIN HFA) 108 (90 BASE) MCG/ACT inhaler 1-2 puffs every 4 hours as needed for SOB       . amLODipine (NORVASC) 10 MG tablet TAKE ONE TABLET BY MOUTH ONE TIME DAILY  30 tablet  5  . atorvastatin (LIPITOR) 40 MG tablet Take 1 tablet (40 mg total) by mouth at bedtime.  30 tablet  5  . budesonide-formoterol (SYMBICORT) 160-4.5 MCG/ACT inhaler Inhale 2 puffs into the lungs 2 (two) times daily.        . Cholecalciferol (VITAMIN D PO) Take by mouth daily.        . clopidogrel (PLAVIX) 75 MG tablet Take 1 tablet (75 mg total) by mouth daily. stop taking aspirin  30 tablet  11  . glucose blood (ONE TOUCH ULTRA TEST) test strip Use as instructed       . insulin detemir (LEVEMIR) 100 UNIT/ML injection Inject 160 Units into the skin every morning.  60 mL  11  . Insulin Syringe-Needle U-100 (BD INSULIN SYRINGE ULTRAFINE) 31G X 5/16" 1 ML MISC 1 once daily       . metoprolol (TOPROL-XL) 100 MG 24 hr tablet Take 1 tablet (100 mg total) by  mouth daily.  90 tablet  2  . montelukast (SINGULAIR) 10 MG tablet 1 tablet by mouth once daily (necessary for asthma control)       . Multiple Vitamin (MULTIVITAMIN) tablet Take 1 tablet by mouth daily.        . quinapril (ACCUPRIL) 40 MG tablet Take 40 mg by mouth at bedtime.        Marland Kitchen spironolactone (ALDACTONE) 25 MG tablet Take 1 tablet (25 mg total) by mouth 2 (two) times daily.  180 tablet  2    No Known Allergies  ROS:  13 systems were reviewed and are notable for mild imbalance and left sided weakness.  All other review of systems are unremarkable.  Exam: . Filed Vitals:   04/18/11 1059  BP: 148/84  Pulse: 88  Weight: 208 lb 14.4 oz (94.756 kg)    In general, obese women in NAD.  Exam remarkable for mildly increased reflexes on the left, with mild weakness on the left and some dysmetria.  Overall seems improved.    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  She will stay on clopidogrel and continue her therapy.  She is to let me know if  she has any further events, but otherwise she can return on a PRN basis.     Lupita Raider Modesto Charon, MD [facility], [place]

## 2011-04-19 ENCOUNTER — Ambulatory Visit: Payer: BC Managed Care – PPO | Admitting: Physical Therapy

## 2011-04-20 ENCOUNTER — Other Ambulatory Visit: Payer: Self-pay | Admitting: Internal Medicine

## 2011-04-20 ENCOUNTER — Telehealth: Payer: Self-pay | Admitting: *Deleted

## 2011-04-20 DIAGNOSIS — E041 Nontoxic single thyroid nodule: Secondary | ICD-10-CM

## 2011-04-20 NOTE — Telephone Encounter (Signed)
Pt has since seen Dr Modesto Charon and would like to proceed with biopsy. Pt would like to know what is the next step in getting this process started.

## 2011-04-21 ENCOUNTER — Encounter (HOSPITAL_COMMUNITY): Payer: Self-pay | Admitting: Emergency Medicine

## 2011-04-21 ENCOUNTER — Emergency Department (HOSPITAL_COMMUNITY)
Admission: EM | Admit: 2011-04-21 | Discharge: 2011-04-21 | Disposition: A | Discharge status: Home / Self Care | Payer: BC Managed Care – PPO | Attending: Emergency Medicine | Admitting: Emergency Medicine

## 2011-04-21 DIAGNOSIS — H579 Unspecified disorder of eye and adnexa: Secondary | ICD-10-CM

## 2011-04-21 DIAGNOSIS — Z794 Long term (current) use of insulin: Secondary | ICD-10-CM | POA: Insufficient documentation

## 2011-04-21 DIAGNOSIS — E119 Type 2 diabetes mellitus without complications: Secondary | ICD-10-CM | POA: Insufficient documentation

## 2011-04-21 DIAGNOSIS — I69998 Other sequelae following unspecified cerebrovascular disease: Secondary | ICD-10-CM | POA: Insufficient documentation

## 2011-04-21 DIAGNOSIS — R29898 Other symptoms and signs involving the musculoskeletal system: Secondary | ICD-10-CM | POA: Insufficient documentation

## 2011-04-21 DIAGNOSIS — I10 Essential (primary) hypertension: Secondary | ICD-10-CM | POA: Insufficient documentation

## 2011-04-21 DIAGNOSIS — R209 Unspecified disturbances of skin sensation: Secondary | ICD-10-CM | POA: Insufficient documentation

## 2011-04-21 HISTORY — DX: Cerebral infarction, unspecified: I63.9

## 2011-04-21 LAB — GLUCOSE, CAPILLARY: Glucose-Capillary: 244 mg/dL — ABNORMAL HIGH (ref 70–99)

## 2011-04-21 NOTE — ED Provider Notes (Signed)
History     CSN: [id number] Arrival date & time: 04/21/2011  2:23 PM   First MD Initiated Contact with Patient 04/21/11 1507      Chief Complaint  Patient presents with  . Numbness    around mouth  . Visual Field Change    (Consider location/radiation/quality/duration/timing/severity/associated sxs/prior treatment) HPI... 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  Today at approximately noon she was standing on the street watching a parade when she felt her visual field become dark.  She felt slightly syncopal. Her daughter thought it might be a low blood sugar and a repeat take. She felt better a few minutes later. He is now back to baseline.  Stroke has left her with a residual limp on her left leg. She says she feels normal now. Glucose in ED was 244.  No chest pain, shortness of breath, new neurological deficits.  Past Medical History  Diagnosis Date  . DIABETES MELLITUS, TYPE II 12/01/2006  . HYPERLIPIDEMIA 12/01/2006  . HYPERTENSION 07/07/2006  . DEPRESSION 03/16/2007  . CHEST PAIN 01/23/2007  . ASTHMA NOS W/ACUTE EXACERBATION 07/10/2010  . Obesity   . Splenomegaly     in college  . Dysmenorrhea   . Fibroid   . TIA (transient ischemic attack) 2012  . Stroke     Past Surgical History  Procedure Date  . Appendectomy   . Myomectomy   . Ovarian cyst removal   . 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 Breast surgery     Reduction  . Pelvic laparoscopy 75,88    DL lysis of adhesions  . Dilation and curettage of uterus 1975    DUB    Family History  Problem Relation Age of Onset  . Cancer Maternal Grandmother     Colon Cancer  . Diabetes Father   . Heart disease Father   . Hypertension Father   . Hypertension Mother   . Heart disease Mother   . Ovarian cancer Mother   . Cancer Mother     Lung cancer  . Diabetes Brother   . Hypertension Brother   . Kidney disease Brother   . Graves' disease  Sister   . Graves' disease Paternal Grandmother     History  Substance Use Topics  . Smoking status: Never Smoker   . Smokeless tobacco: Never Used  . Alcohol Use: No     very rare    OB History    Grav Para Term Preterm Abortions TAB SAB Ect Mult Living   0               Review of Systems  All other systems reviewed and are negative.    Allergies  Review of patient's allergies indicates no known allergies.  Home Medications   Current Outpatient Rx  Name Route Sig Dispense Refill  . ALBUTEROL SULFATE HFA 108 (90 BASE) MCG/ACT IN AERS  1-2 puffs every 4 hours as needed for SOB     . AMLODIPINE BESYLATE 10 MG PO TABS  TAKE ONE TABLET BY MOUTH ONE TIME DAILY 30 tablet 5  . ATORVASTATIN CALCIUM 40 MG PO TABS Oral Take 1 tablet (40 mg total) by mouth at bedtime. 30 tablet 5  . BUDESONIDE-FORMOTEROL FUMARATE 160-4.5 MCG/ACT IN AERO Inhalation Inhale 2 puffs into the lungs 2 (two) times daily.      Marland Kitchen VITAMIN D PO Oral Take 1,000 Units by mouth  daily.     Marland Kitchen CLOPIDOGREL BISULFATE 75 MG PO TABS Oral Take 1 tablet (75 mg total) by mouth daily. stop taking aspirin 30 tablet 11  . INSULIN DETEMIR 100 UNIT/ML [place] SOLN Subcutaneous Inject 160 Units into the skin every morning. 60 mL 11  . METOPROLOL SUCCINATE ER 100 MG PO TB24 Oral Take 1 tablet (100 mg total) by mouth daily. 90 tablet 2  . MONTELUKAST SODIUM 10 MG PO TABS  1 tablet by mouth once daily (necessary for asthma control)     . ONE-DAILY MULTI VITAMINS PO TABS Oral Take 1 tablet by mouth daily.      . QUINAPRIL HCL 40 MG PO TABS Oral Take 40 mg by mouth daily.     Marland Kitchen SPIRONOLACTONE 25 MG PO TABS Oral Take 1 tablet (25 mg total) by mouth 2 (two) times daily. 180 tablet 2    BP 133/78  Pulse 78  Temp(Src) 98.3 F (36.8 C) (Oral)  Resp 20  SpO2 98%  Physical Exam  Nursing note and vitals reviewed. Constitutional: She is oriented to person, place, and time. She appears well-developed and well-nourished.  HENT:  Head:  Normocephalic and atraumatic.  Eyes: Conjunctivae and EOM are normal. Pupils are equal, round, and reactive to light.  Neck: Normal range of motion. Neck supple.  Cardiovascular: Normal rate and regular rhythm.   Pulmonary/Chest: Effort normal and breath sounds normal.  Abdominal: Soft. Bowel sounds are normal.  Musculoskeletal: Normal range of motion.  Neurological: She is alert and oriented to person, place, and time.       Residual left leg weakness the  Skin: Skin is warm and dry.  Psychiatric: She has a normal mood and affect.    ED Course  Procedures (including critical care time)  Labs Reviewed  GLUCOSE, CAPILLARY - Abnormal; Notable for the following:    Glucose-Capillary 244 (*)    All other components within normal limits   No results found.   1. Visual complaint       MDM  Patient is back to baseline. She does not want any new testing. She is comfortable going home and following up with her neurologist next week. This may have represented a TIA.  She is back to baseline.        Donnetta Hutching, MD 04/21/11 [phone]

## 2011-04-21 NOTE — ED Notes (Signed)
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 Also reports vision became "dark". She thought that it may be low blood sugar and ate a small piece cake. Pt has no nero deficits at this time. Doctor notified.

## 2011-04-21 NOTE — ED Notes (Signed)
Dr Brooke Dare notified of patient.

## 2011-04-25 ENCOUNTER — Ambulatory Visit: Payer: BC Managed Care – PPO | Attending: Neurology | Admitting: Physical Therapy

## 2011-04-25 DIAGNOSIS — R262 Difficulty in walking, not elsewhere classified: Secondary | ICD-10-CM | POA: Insufficient documentation

## 2011-04-25 DIAGNOSIS — IMO0001 Reserved for inherently not codable concepts without codable children: Secondary | ICD-10-CM | POA: Insufficient documentation

## 2011-04-25 DIAGNOSIS — R279 Unspecified lack of coordination: Secondary | ICD-10-CM | POA: Insufficient documentation

## 2011-04-25 DIAGNOSIS — M6281 Muscle weakness (generalized): Secondary | ICD-10-CM | POA: Insufficient documentation

## 2011-04-25 DIAGNOSIS — R5381 Other malaise: Secondary | ICD-10-CM | POA: Insufficient documentation

## 2011-04-26 ENCOUNTER — Telehealth: Payer: Self-pay | Admitting: Emergency Medicine

## 2011-04-26 ENCOUNTER — Telehealth: Payer: Self-pay

## 2011-04-26 ENCOUNTER — Telehealth: Payer: Self-pay | Admitting: Internal Medicine

## 2011-04-26 NOTE — Telephone Encounter (Signed)
Patient went to [facility] ER on 04-21-11, concerned that she may be having another stroke.  She is calling to schedule her FNA, but wants Dr. Alwyn Ren to see her ER visit notes & assure her if she should still stop the Plavix 5 days prior, & Aspirin 7-10 prior to FNA.  Please advise.

## 2011-04-26 NOTE — Telephone Encounter (Signed)
LMOVM AT DR [facility] OFFICE TO GET AN OK FOR PT TO STOP PLAVIX X5D BEGINNING 04-27-11 FOR THYROID FNA SCHEDULED FOR 05-02-11.    TIFFANY W/ DR [facility] OFFICE CALLED BACK TO STATE IT WAS OK FOR PT TO STOP PLAVIX X5D FOR FNA.

## 2011-04-26 NOTE — Telephone Encounter (Signed)
Call from Tammy at [facility], pt is scheduled for fna of her thyroid next week and she wants to know if ok to stop plavix tomorrow.  Spoke with Dr. Modesto Charon and he said it is ok to stop Plavix for the procedure.  Left msg on Tammy's voicemail at GI.

## 2011-04-27 ENCOUNTER — Ambulatory Visit: Payer: BC Managed Care – PPO | Admitting: Physical Therapy

## 2011-04-30 ENCOUNTER — Other Ambulatory Visit: Payer: Self-pay | Admitting: Internal Medicine

## 2011-04-30 NOTE — Telephone Encounter (Signed)
I'll ask Dr. Modesto Charon if he feels she should be reevaluated by him  before we hold the Plavix for the fine needle aspiration of thyroid nodule.

## 2011-04-30 NOTE — Telephone Encounter (Signed)
See other note

## 2011-04-30 NOTE — Telephone Encounter (Signed)
I think I need to see the patient back in clinic before she has her FNA.  I am concerned about her spell but the ED note does not give me enough detail to determine an etiology.  Misty Stanley - Could you call [name] to fit her in over the next couple of weeks?

## 2011-04-30 NOTE — Telephone Encounter (Signed)
Left message to call office

## 2011-04-30 NOTE — Telephone Encounter (Signed)
See note

## 2011-05-01 ENCOUNTER — Ambulatory Visit: Payer: BC Managed Care – PPO | Admitting: Physical Therapy

## 2011-05-02 ENCOUNTER — Ambulatory Visit
Admission: RE | Admit: 2011-05-02 | Discharge: 2011-05-02 | Disposition: A | Discharge status: Home / Self Care | Payer: BC Managed Care – PPO | Source: Ambulatory Visit | Attending: Internal Medicine | Admitting: Internal Medicine

## 2011-05-02 ENCOUNTER — Other Ambulatory Visit (HOSPITAL_COMMUNITY)
Admission: RE | Admit: 2011-05-02 | Discharge: 2011-05-02 | Disposition: A | Discharge status: Home / Self Care | Payer: BC Managed Care – PPO | Source: Ambulatory Visit | Attending: Interventional Radiology | Admitting: Interventional Radiology

## 2011-05-02 DIAGNOSIS — E041 Nontoxic single thyroid nodule: Secondary | ICD-10-CM

## 2011-05-02 DIAGNOSIS — E049 Nontoxic goiter, unspecified: Secondary | ICD-10-CM | POA: Insufficient documentation

## 2011-05-02 HISTORY — PX: BIOPSY THYROID: PRO38

## 2011-05-03 ENCOUNTER — Ambulatory Visit: Payer: BC Managed Care – PPO | Admitting: Physical Therapy

## 2011-05-04 ENCOUNTER — Telehealth: Payer: Self-pay

## 2011-05-04 NOTE — Telephone Encounter (Signed)
I'd still like to see her given this new spell.

## 2011-05-04 NOTE — Telephone Encounter (Signed)
Spoke with patient, patient states she this situation is complete. Patient seen Dr.Wong, stopped plavix, and had procedure

## 2011-05-04 NOTE — Telephone Encounter (Signed)
Dr. Modesto Charon - Pt already had FNA, ok'd per you by telephone call. Does she still need to come in for a fu visit with [place]?

## 2011-05-04 NOTE — Telephone Encounter (Signed)
Left message to call office

## 2011-05-04 NOTE — Telephone Encounter (Signed)
Left message on home voicemail in reference to Thyroid, Fine needle aspiration, left : Excellent report; no evidence of malignancy. Annual monitor by manual exam and thyroid function test recommended. Hopp  Patient to call if any questions or concerns

## 2011-05-07 NOTE — Telephone Encounter (Signed)
Pt scheduled for 06/13/2011 at 10:30 am.

## 2011-05-08 ENCOUNTER — Ambulatory Visit: Payer: BC Managed Care – PPO | Admitting: Physical Therapy

## 2011-05-10 ENCOUNTER — Ambulatory Visit: Payer: BC Managed Care – PPO | Admitting: Physical Therapy

## 2011-05-18 ENCOUNTER — Other Ambulatory Visit: Payer: Self-pay | Admitting: Endocrinology

## 2011-05-25 ENCOUNTER — Ambulatory Visit: Payer: BC Managed Care – PPO | Attending: Neurology | Admitting: Physical Therapy

## 2011-05-25 DIAGNOSIS — M6281 Muscle weakness (generalized): Secondary | ICD-10-CM | POA: Insufficient documentation

## 2011-05-25 DIAGNOSIS — R279 Unspecified lack of coordination: Secondary | ICD-10-CM | POA: Insufficient documentation

## 2011-05-25 DIAGNOSIS — IMO0001 Reserved for inherently not codable concepts without codable children: Secondary | ICD-10-CM | POA: Insufficient documentation

## 2011-05-25 DIAGNOSIS — R5381 Other malaise: Secondary | ICD-10-CM | POA: Insufficient documentation

## 2011-05-25 DIAGNOSIS — R262 Difficulty in walking, not elsewhere classified: Secondary | ICD-10-CM | POA: Insufficient documentation

## 2011-05-28 ENCOUNTER — Other Ambulatory Visit (INDEPENDENT_AMBULATORY_CARE_PROVIDER_SITE_OTHER): Payer: BC Managed Care – PPO

## 2011-05-28 ENCOUNTER — Ambulatory Visit (INDEPENDENT_AMBULATORY_CARE_PROVIDER_SITE_OTHER): Payer: BC Managed Care – PPO | Admitting: Endocrinology

## 2011-05-28 ENCOUNTER — Encounter: Payer: Self-pay | Admitting: Endocrinology

## 2011-05-28 VITALS — BP 122/80 | HR 91 | Temp 98.2°F | Ht 66.0 in | Wt 208.2 lb

## 2011-05-28 DIAGNOSIS — E119 Type 2 diabetes mellitus without complications: Secondary | ICD-10-CM

## 2011-05-28 LAB — HEMOGLOBIN A1C: Hgb A1c MFr Bld: 9.9 % — ABNORMAL HIGH (ref 4.6–6.5)

## 2011-05-28 NOTE — Progress Notes (Signed)
Subjective:    Patient ID: Rebekah Kennedy, female    DOB: 1955/09/24, 56 y.o.   MRN: [id number]  HPI Pt returns for f/u of insulin-requiring DM (2004).  She thinks she may have had 1 episode of hypoglycemia, and that was mild.  She ate a candy bar, then went to the ER, where cbg was 244.  no cbg record, but states cbg's otherwise vary from 150-250.  There is no trend throughout the day.   Past Medical History  Diagnosis Date  . DIABETES MELLITUS, TYPE II 12/01/2006  . HYPERLIPIDEMIA 12/01/2006  . HYPERTENSION 07/07/2006  . DEPRESSION 03/16/2007  . CHEST PAIN 01/23/2007  . ASTHMA NOS W/ACUTE EXACERBATION 07/10/2010  . Obesity   . Splenomegaly     in college  . Dysmenorrhea   . Fibroid   . TIA (transient ischemic attack) 2012  . Stroke     Past Surgical History  Procedure Date  . Appendectomy   . Myomectomy   . Ovarian cyst removal   . 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 Breast surgery     Reduction  . Pelvic laparoscopy 75,88    DL lysis of adhesions  . Dilation and curettage of uterus 1975    DUB    History   Social History  . Marital Status: Single    Spouse Name: N/A    Number of Children: 2  . Years of Education: N/A   Occupational History  . TEACHER    Social History Main Topics  . Smoking status: Never Smoker   . Smokeless tobacco: Never Used  . Alcohol Use: No     very rare  . Drug Use: No  . Sexually Active: Yes    Birth Control/ Protection: Post-menopausal   Other Topics Concern  . Not on file   Social History Narrative  . No narrative on file    Current Outpatient Prescriptions on File Prior to Visit  Medication Sig Dispense Refill  . albuterol (VENTOLIN HFA) 108 (90 BASE) MCG/ACT inhaler 1-2 puffs every 4 hours as needed for SOB       . amLODipine (NORVASC) 10 MG tablet TAKE ONE TABLET BY MOUTH ONE TIME DAILY  30 tablet  5  . atorvastatin (LIPITOR) 40 MG tablet Take 1 tablet (40 mg total) by mouth at bedtime.  30 tablet  5  . B-D INS SYR  ULTRAFINE 1CC/31G 31G X 5/16" 1 ML MISC USE 1-3 DAILY FOR INSULIN DOSE.  100 each  5  . budesonide-formoterol (SYMBICORT) 160-4.5 MCG/ACT inhaler Inhale 2 puffs into the lungs 2 (two) times daily.        . Cholecalciferol (VITAMIN D PO) Take 1,000 Units by mouth daily.       . clopidogrel (PLAVIX) 75 MG tablet Take 1 tablet (75 mg total) by mouth daily. stop taking aspirin  30 tablet  11  . metoprolol (TOPROL-XL) 100 MG 24 hr tablet Take 1 tablet (100 mg total) by mouth daily.  90 tablet  2  . montelukast (SINGULAIR) 10 MG tablet 1 tablet by mouth once daily (necessary for asthma control)       . Multiple Vitamin (MULTIVITAMIN) tablet Take 1 tablet by mouth daily.        . quinapril (ACCUPRIL) 40 MG tablet TAKE ONE TABLET BY MOUTH ONE TIME DAILY  30 tablet  5  . spironolactone (ALDACTONE) 25 MG tablet Take 1 tablet (25 mg total) by mouth 2 (two) times daily.  180 tablet  2  No Known Allergies  Family History  Problem Relation Age of Onset  . Cancer Maternal Grandmother     Colon Cancer  . Diabetes Father   . Heart disease Father   . Hypertension Father   . Hypertension Mother   . Heart disease Mother   . Ovarian cancer Mother   . Cancer Mother     Lung cancer  . Diabetes Brother   . Hypertension Brother   . Kidney disease Brother   . Graves' disease Sister   . Graves' disease Paternal Grandmother     BP 122/80  Pulse 91  Temp(Src) 98.2 F (36.8 C) (Oral)  Ht 5\' 6"  (1.676 m)  Wt 208 lb 3.2 oz (94.439 kg)  BMI 33.60 kg/m2  SpO2 97%    Review of Systems Denies loc    Objective:   Physical Exam VITAL SIGNS:  See vs page GENERAL: no distress Pulses: dorsalis pedis intact bilat.   Feet: no deformity.  no ulcer on the feet.  feet are of normal color and temp.  no edema Neuro: sensation is intact to touch on the feet  Lab Results  Component Value Date   HGBA1C 9.9* 05/28/2011      Assessment & Plan:  DM, needs increased rx

## 2011-05-28 NOTE — Patient Instructions (Addendum)
blood tests are being requested for you today.  please call [phone] to hear your test results.  You will be prompted to enter the 9-digit "MRN" number that appears at the top left of this page, followed by #.  Then you will hear the message. pending the test results, please continue levemir 160 units of levemir each morning.   Please make a follow-up appointment in 3 months. check your blood sugar 2 times a day.  vary the time of day when you check, between before the 3 meals, and at bedtime.  also check if you have symptoms of your blood sugar being too high or too low.  please keep a record of the readings and bring it to your next appointment here.  please call us sooner if you are having low blood sugar episodes, or if it stays over 200. (update: i left message on phone-tree:  Increase levemir to 180 units qam)

## 2011-06-07 ENCOUNTER — Ambulatory Visit (INDEPENDENT_AMBULATORY_CARE_PROVIDER_SITE_OTHER): Payer: BC Managed Care – PPO | Admitting: *Deleted

## 2011-06-07 DIAGNOSIS — Z2911 Encounter for prophylactic immunotherapy for respiratory syncytial virus (RSV): Secondary | ICD-10-CM

## 2011-06-07 DIAGNOSIS — Z23 Encounter for immunization: Secondary | ICD-10-CM

## 2011-06-07 DIAGNOSIS — Z0000 Encounter for general adult medical examination without abnormal findings: Secondary | ICD-10-CM

## 2011-06-13 ENCOUNTER — Ambulatory Visit (INDEPENDENT_AMBULATORY_CARE_PROVIDER_SITE_OTHER): Payer: BC Managed Care – PPO | Admitting: Neurology

## 2011-06-13 ENCOUNTER — Encounter: Payer: Self-pay | Admitting: Neurology

## 2011-06-13 VITALS — BP 120/80 | HR 88 | Ht 65.0 in | Wt 210.0 lb

## 2011-06-13 DIAGNOSIS — I6789 Other cerebrovascular disease: Secondary | ICD-10-CM

## 2011-06-13 DIAGNOSIS — G463 Brain stem stroke syndrome: Secondary | ICD-10-CM

## 2011-06-13 NOTE — Progress Notes (Signed)
Dear Dr. Alwyn Ren,  I saw  Rebekah Kennedy back in [facility] clinic for her problem with a right pontine stroke.  As you may recall, she is a 56 y.o. year old female with a history of diabetes, HTN, HLD who presented with a right pontine stroke, likely small vessel.  Imaging of her basilar system was unremarkable.  I switched her to clopidogrel from aspirin because of the stroke.  However, she had a spell in December where she was standing at the parade and felt faint.  She felt like things were "distant".  She had no double vision, dysarthria or dysphagia.  Her vision was "distant" as well but not black.  She recovered after about 10 minutes after she had some sweets.  However, she had just eaten that morning, and did not take her insulin that a.m.   She went to the ED and they did not think she had a TIA or stroke.  EKG was normal and BG was normal at that time.  She had her FNA of her thyroid nodule  in the interim and it was benign.  Medical history, social history, and family history were reviewed and have not changed since the last clinic visit.  Current Outpatient Prescriptions on File Prior to Visit  Medication Sig Dispense Refill  . albuterol (VENTOLIN HFA) 108 (90 BASE) MCG/ACT inhaler 1-2 puffs every 4 hours as needed for SOB       . amLODipine (NORVASC) 10 MG tablet TAKE ONE TABLET BY MOUTH ONE TIME DAILY  30 tablet  5  . atorvastatin (LIPITOR) 40 MG tablet Take 1 tablet (40 mg total) by mouth at bedtime.  30 tablet  5  . B-D INS SYR ULTRAFINE 1CC/31G 31G X 5/16" 1 ML MISC USE 1-3 DAILY FOR INSULIN DOSE.  100 each  5  . budesonide-formoterol (SYMBICORT) 160-4.5 MCG/ACT inhaler Inhale 2 puffs into the lungs 2 (two) times daily.        . Cholecalciferol (VITAMIN D PO) Take 1,000 Units by mouth daily.       . clopidogrel (PLAVIX) 75 MG tablet Take 1 tablet (75 mg total) by mouth daily. stop taking aspirin  30 tablet  11  . insulin detemir (LEVEMIR) 100 UNIT/ML injection Inject 180  Units into the skin every morning.        . metoprolol (TOPROL-XL) 100 MG 24 hr tablet Take 1 tablet (100 mg total) by mouth daily.  90 tablet  2  . montelukast (SINGULAIR) 10 MG tablet 1 tablet by mouth once daily (necessary for asthma control)       . Multiple Vitamin (MULTIVITAMIN) tablet Take 1 tablet by mouth daily.        . quinapril (ACCUPRIL) 40 MG tablet TAKE ONE TABLET BY MOUTH ONE TIME DAILY  30 tablet  5  . spironolactone (ALDACTONE) 25 MG tablet Take 1 tablet (25 mg total) by mouth 2 (two) times daily.  180 tablet  2    No Known Allergies  ROS:  13 systems were reviewed and are notable for mild left sided weakness.  Walking fast is still difficult.  She has been discharged from PT.  All other review of systems are unremarkable.  Exam: . Filed Vitals:   06/13/11 1033  BP: 120/80  Pulse: 88  Height: 5\' 5"  (1.651 m)  Weight: 210 lb (95.255 kg)    In general, well appearing women.  Mental status:   The patient is oriented to person, place and time. Recent  and remote memory are intact. Attention span and concentration are normal. Language including repetition, naming, following commands are intact. Fund of knowledge of current and historical events, as well as vocabulary are normal.  Cranial Nerves: Pupils are equally round and reactive to light. Visual fields full to confrontation. Extraocular movements are intact without nystagmus. Facial sensation and muscles of mastication are intact. Muscles of facial expression are symmetric. Hearing intact to bilateral finger rub. Tongue protrusion, uvula, palate midline.  Shoulder shrug delayed on left.  Motor:  Normal bulk and tone, no drift and 5/5 muscle strength bilaterally.  Reflexes:  2+ thoughout, except absent at ankles, toes down.  Coordination:  Normal finger to nose on right, but mildly dysmetric on left.  Gait:  Normal gait and station.  Romberg negative.  Tandem gait impaired.  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  Mild residual ataxic-hemiparesis.  I am not sure what the nature of the other spell was, but it sounds like a vasovagal event, or less likely low blood sugar(because she had just eaten).  There were no "neighborhood" signs to indicate a brainstem event.  I would just watch her for further events, and have reminded her to go by ambulance to the ED for any new events.  We will see her back on a PRN basis.  Lupita Raider Modesto Charon, MD [facility], [place]

## 2011-07-24 ENCOUNTER — Encounter: Payer: Self-pay | Admitting: Obstetrics and Gynecology

## 2011-08-03 ENCOUNTER — Encounter: Payer: Self-pay | Admitting: Internal Medicine

## 2011-08-27 ENCOUNTER — Other Ambulatory Visit (INDEPENDENT_AMBULATORY_CARE_PROVIDER_SITE_OTHER): Payer: BC Managed Care – PPO

## 2011-08-27 ENCOUNTER — Encounter: Payer: Self-pay | Admitting: Endocrinology

## 2011-08-27 ENCOUNTER — Ambulatory Visit (INDEPENDENT_AMBULATORY_CARE_PROVIDER_SITE_OTHER): Payer: BC Managed Care – PPO | Admitting: Endocrinology

## 2011-08-27 VITALS — BP 126/82 | HR 82 | Temp 98.1°F | Ht 69.0 in | Wt 211.8 lb

## 2011-08-27 DIAGNOSIS — E119 Type 2 diabetes mellitus without complications: Secondary | ICD-10-CM

## 2011-08-27 NOTE — Patient Instructions (Addendum)
blood tests are being requested for you today.  You will receive a letter with results. pending the test results, please continue levemir 180 units of levemir each morning.   Please make a follow-up appointment in 3 months.  check your blood sugar 2 times a day.  vary the time of day when you check, between before the 3 meals, and at bedtime.  also check if you have symptoms of your blood sugar being too high or too low.  please keep a record of the readings and bring it to your next appointment here.  please call us sooner if you are having low blood sugar episodes, or if it stays over 200.

## 2011-08-27 NOTE — Progress Notes (Signed)
Subjective:    Patient ID: Rebekah Kennedy, female    DOB: 1955-12-04, 56 y.o.   MRN: [id number]  HPI Pt returns for f/u of insulin-requiring DM (2004).  no cbg record, but states cbg's vary from 130-250.  There is no trend throughout the day.  Past Medical History  Diagnosis Date  . DIABETES MELLITUS, TYPE II 12/01/2006  . HYPERLIPIDEMIA 12/01/2006  . HYPERTENSION 07/07/2006  . DEPRESSION 03/16/2007  . CHEST PAIN 01/23/2007  . ASTHMA NOS W/ACUTE EXACERBATION 07/10/2010  . Obesity   . Splenomegaly     in college  . Dysmenorrhea   . Fibroid   . TIA (transient ischemic attack) 2012  . Stroke     Past Surgical History  Procedure Date  . Appendectomy   . Myomectomy   . Ovarian cyst removal   . 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 Breast surgery     Reduction  . Pelvic laparoscopy 75,88    DL lysis of adhesions  . Dilation and curettage of uterus 1975    DUB    History   Social History  . Marital Status: Single    Spouse Name: N/A    Number of Children: 2  . Years of Education: N/A   Occupational History  . TEACHER    Social History Main Topics  . Smoking status: Never Smoker   . Smokeless tobacco: Never Used  . Alcohol Use: No     very rare  . Drug Use: No  . Sexually Active: Yes    Birth Control/ Protection: Post-menopausal   Other Topics Concern  . Not on file   Social History Narrative  . No narrative on file    Current Outpatient Prescriptions on File Prior to Visit  Medication Sig Dispense Refill  . albuterol (VENTOLIN HFA) 108 (90 BASE) MCG/ACT inhaler 1-2 puffs every 4 hours as needed for SOB       . amLODipine (NORVASC) 10 MG tablet TAKE ONE TABLET BY MOUTH ONE TIME DAILY  30 tablet  5  . atorvastatin (LIPITOR) 40 MG tablet Take 1 tablet (40 mg total) by mouth at bedtime.  30 tablet  5  . B-D INS SYR ULTRAFINE 1CC/31G 31G X 5/16" 1 ML MISC USE 1-3 DAILY FOR INSULIN DOSE.  100 each  5  . budesonide-formoterol (SYMBICORT) 160-4.5 MCG/ACT inhaler Inhale 2  puffs into the lungs 2 (two) times daily.        . Cholecalciferol (VITAMIN D PO) Take 1,000 Units by mouth daily.       . clopidogrel (PLAVIX) 75 MG tablet Take 1 tablet (75 mg total) by mouth daily. stop taking aspirin  30 tablet  11  . insulin detemir (LEVEMIR) 100 UNIT/ML injection Inject 180 Units into the skin every morning.        . metoprolol (TOPROL-XL) 100 MG 24 hr tablet Take 1 tablet (100 mg total) by mouth daily.  90 tablet  2  . montelukast (SINGULAIR) 10 MG tablet 1 tablet by mouth once daily (necessary for asthma control)       . Multiple Vitamin (MULTIVITAMIN) tablet Take 1 tablet by mouth daily.        . quinapril (ACCUPRIL) 40 MG tablet TAKE ONE TABLET BY MOUTH ONE TIME DAILY  30 tablet  5  . spironolactone (ALDACTONE) 25 MG tablet Take 1 tablet (25 mg total) by mouth 2 (two) times daily.  180 tablet  2    No Known Allergies    BP  126/82  Pulse 82  Temp(Src) 98.1 F (36.7 C) (Oral)  Ht 5\' 9"  (1.753 m)  Wt 211 lb 12.8 oz (96.072 kg)  BMI 31.28 kg/m2  SpO2 97%   Review of Systems denies hypoglycemia    Objective:   Physical Exam VITAL SIGNS:  See vs page GENERAL: no distress SKIN:  Insulin injection sites at the anterior abdomen are normal     Assessment & Plan:  DM.  She apparently needs increased rx

## 2011-08-30 ENCOUNTER — Encounter: Payer: Self-pay | Admitting: Endocrinology

## 2011-08-31 ENCOUNTER — Telehealth: Payer: Self-pay | Admitting: *Deleted

## 2011-08-31 NOTE — Telephone Encounter (Signed)
Called pt to inform of A1c results, pt informed. (Letter also mailed to pt) 

## 2011-11-14 ENCOUNTER — Other Ambulatory Visit: Payer: Self-pay | Admitting: Internal Medicine

## 2011-11-14 NOTE — Telephone Encounter (Signed)
Request for 16109 IU Vit D therapy; do not see note and/or need from labs for this Rx/SLS Please advise.

## 2011-11-14 NOTE — Telephone Encounter (Signed)
She needs a vitamin D level drawn before such a high dose is prescribed

## 2011-11-15 NOTE — Telephone Encounter (Signed)
Left message to call office

## 2011-11-16 NOTE — Telephone Encounter (Signed)
Discuss with patient who states she has been doing the OTC vitamin d. Pt indicated that she was just confused when she ordered her med and does not think that she needs Rx Vitamin d but will discuss at next OV if level is needed.

## 2011-11-26 ENCOUNTER — Ambulatory Visit (INDEPENDENT_AMBULATORY_CARE_PROVIDER_SITE_OTHER): Payer: BC Managed Care – PPO | Admitting: Endocrinology

## 2011-11-26 ENCOUNTER — Other Ambulatory Visit (INDEPENDENT_AMBULATORY_CARE_PROVIDER_SITE_OTHER): Payer: BC Managed Care – PPO

## 2011-11-26 ENCOUNTER — Encounter: Payer: Self-pay | Admitting: Endocrinology

## 2011-11-26 VITALS — BP 134/84 | HR 81 | Temp 98.1°F | Ht 69.0 in | Wt 215.0 lb

## 2011-11-26 DIAGNOSIS — E1065 Type 1 diabetes mellitus with hyperglycemia: Secondary | ICD-10-CM

## 2011-11-26 DIAGNOSIS — H579 Unspecified disorder of eye and adnexa: Secondary | ICD-10-CM

## 2011-11-26 DIAGNOSIS — E1039 Type 1 diabetes mellitus with other diabetic ophthalmic complication: Secondary | ICD-10-CM

## 2011-11-26 LAB — HEMOGLOBIN A1C: Hgb A1c MFr Bld: 10.7 % — ABNORMAL HIGH (ref 4.6–6.5)

## 2011-11-26 MED ORDER — INSULIN DETEMIR 100 UNIT/ML ~~LOC~~ SOLN: 300.0000 [IU] | SUBCUTANEOUS | Status: DC

## 2011-11-26 NOTE — Progress Notes (Signed)
Subjective:    Patient ID: Rebekah Kennedy, female    DOB: [birth year], 56 y.o.   MRN: [id number]  HPI Pt returns for f/u of insulin-requiring DM (dx'ed 2004; complicated by CVA and retinopathy).  no cbg record, but states cbg's vary from 230-350.  There is no trend throughout the day.  Past Medical History  Diagnosis Date  . DIABETES MELLITUS, TYPE II 12/01/2006  . HYPERLIPIDEMIA 12/01/2006  . HYPERTENSION 07/07/2006  . DEPRESSION 03/16/2007  . CHEST PAIN 01/23/2007  . ASTHMA NOS W/ACUTE EXACERBATION 07/10/2010  . Obesity   . Splenomegaly     in college  . Dysmenorrhea   . Fibroid   . TIA (transient ischemic attack) 2012  . Stroke     Past Surgical History  Procedure Date  . Appendectomy   . Myomectomy   . Ovarian cyst removal   . 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 Breast surgery     Reduction  . Pelvic laparoscopy 75,88    DL lysis of adhesions  . Dilation and curettage of uterus 1975    DUB    History   Social History  . Marital Status: Single    Spouse Name: N/A    Number of Children: 2  . Years of Education: N/A   Occupational History  . TEACHER    Social History Main Topics  . Smoking status: Never Smoker   . Smokeless tobacco: Never Used  . Alcohol Use: No     very rare  . Drug Use: No  . Sexually Active: Yes    Birth Control/ Protection: Post-menopausal   Other Topics Concern  . Not on file   Social History Narrative  . No narrative on file    Current Outpatient Prescriptions on File Prior to Visit  Medication Sig Dispense Refill  . albuterol (VENTOLIN HFA) 108 (90 BASE) MCG/ACT inhaler 1-2 puffs every 4 hours as needed for SOB       . amLODipine (NORVASC) 10 MG tablet TAKE ONE TABLET BY MOUTH ONE TIME DAILY  30 tablet  5  . atorvastatin (LIPITOR) 40 MG tablet Take 1 tablet (40 mg total) by mouth at bedtime.  30 tablet  5  . B-D INS SYR ULTRAFINE 1CC/31G 31G X 5/16" 1 ML MISC USE 1-3 DAILY FOR INSULIN DOSE.  100 each  5  . budesonide-formoterol  (SYMBICORT) 160-4.5 MCG/ACT inhaler Inhale 2 puffs into the lungs 2 (two) times daily.        . clopidogrel (PLAVIX) 75 MG tablet Take 1 tablet (75 mg total) by mouth daily. stop taking aspirin  30 tablet  11  . insulin detemir (LEVEMIR) 100 UNIT/ML injection Inject 300 Units into the skin every morning.  100 mL  11  . metoprolol (TOPROL-XL) 100 MG 24 hr tablet Take 1 tablet (100 mg total) by mouth daily.  90 tablet  2  . montelukast (SINGULAIR) 10 MG tablet 1 tablet by mouth once daily (necessary for asthma control)       . Multiple Vitamin (MULTIVITAMIN) tablet Take 1 tablet by mouth daily.        . quinapril (ACCUPRIL) 40 MG tablet TAKE ONE TABLET BY MOUTH ONE TIME DAILY  30 tablet  5  . spironolactone (ALDACTONE) 25 MG tablet Take 1 tablet (25 mg total) by mouth 2 (two) times daily.  180 tablet  2    No Known Allergies  Family History  Problem Relation Age of Onset  . Cancer Maternal Grandmother  Colon Cancer  . Diabetes Father   . Heart disease Father   . Hypertension Father   . Hypertension Mother   . Heart disease Mother   . Ovarian cancer Mother   . Cancer Mother     Lung cancer  . Diabetes Brother   . Hypertension Brother   . Kidney disease Brother   . Graves' disease Sister   . Graves' disease Paternal Grandmother     BP 134/84  Pulse 81  Temp 98.1 F (36.7 C) (Oral)  Ht 5\' 9"  (1.753 m)  Wt 215 lb (97.523 kg)  BMI 31.75 kg/m2  SpO2 97%  Review of Systems denies hypoglycemia    Objective:   Physical Exam VITAL SIGNS:  See vs page GENERAL: no distress Pulses: dorsalis pedis intact bilat.   Feet: no deformity.  no ulcer on the feet.  feet are of normal color and temp.  no edema Neuro: sensation is intact to touch on the feet  Lab Results  Component Value Date   HGBA1C 10.7* 11/26/2011      Assessment & Plan:  DM.  needs increased rx

## 2011-11-26 NOTE — Patient Instructions (Addendum)
blood tests are being requested for you today.  You will receive a letter with results.  pending the test results, please increase levemir to 300 units each morning.   Please make a follow-up appointment in 3 months.  check your blood sugar 2 times a day.  vary the time of day when you check, between before the 3 meals, and at bedtime.  also check if you have symptoms of your blood sugar being too high or too low.  please keep a record of the readings and bring it to your next appointment here.  please call us sooner if you are having low blood sugar episodes, or if it stays over 200.

## 2011-12-10 ENCOUNTER — Ambulatory Visit (INDEPENDENT_AMBULATORY_CARE_PROVIDER_SITE_OTHER): Payer: BC Managed Care – PPO | Admitting: Internal Medicine

## 2011-12-10 ENCOUNTER — Encounter: Payer: Self-pay | Admitting: Internal Medicine

## 2011-12-10 VITALS — BP 126/86 | HR 91 | Wt 217.0 lb

## 2011-12-10 DIAGNOSIS — R05 Cough: Secondary | ICD-10-CM

## 2011-12-10 DIAGNOSIS — J45909 Unspecified asthma, uncomplicated: Secondary | ICD-10-CM

## 2011-12-10 DIAGNOSIS — L909 Atrophic disorder of skin, unspecified: Secondary | ICD-10-CM

## 2011-12-10 DIAGNOSIS — E049 Nontoxic goiter, unspecified: Secondary | ICD-10-CM | POA: Insufficient documentation

## 2011-12-10 DIAGNOSIS — L918 Other hypertrophic disorders of the skin: Secondary | ICD-10-CM

## 2011-12-10 DIAGNOSIS — I10 Essential (primary) hypertension: Secondary | ICD-10-CM

## 2011-12-10 DIAGNOSIS — R059 Cough, unspecified: Secondary | ICD-10-CM

## 2011-12-10 DIAGNOSIS — L919 Hypertrophic disorder of the skin, unspecified: Secondary | ICD-10-CM

## 2011-12-10 MED ORDER — MONTELUKAST SODIUM 10 MG PO TABS: 10.0000 mg | ORAL_TABLET | Freq: Every day | ORAL | Status: DC

## 2011-12-10 MED ORDER — LOSARTAN POTASSIUM 100 MG PO TABS: 100.0000 mg | ORAL_TABLET | Freq: Every day | ORAL | Status: DC

## 2011-12-10 MED ORDER — ALBUTEROL SULFATE HFA 108 (90 BASE) MCG/ACT IN AERS: 2.0000 | INHALATION_SPRAY | RESPIRATORY_TRACT | Status: DC | PRN

## 2011-12-10 NOTE — Progress Notes (Signed)
  Subjective:    Patient ID: Rebekah Kennedy, female    DOB: 1956/04/25, 56 y.o.   MRN: [id number]  HPI She's had a  cough for several months worse in the last several weeks. It is intermittent and nonproductive. She's been off her Singulair for several months: She is trying to see if she actually needed. She uses albuterol on average twice a week. She is not having paroxysmal nocturnal dyspnea. She does not have significant wheezing or shortness of breath.  She has not been using the maintenance drug Symbicort.  Past medical history/family history/social history were all reviewed and updated. Pertinent data: never smoked    Review of Systems She denies fever, chills, sweats, frontal headache, facial pain, nasal purulence. She is on quinapril 40 mg daily.  She has skin tags on her neck which been aggravated by necklaces. She's requesting referral to dermatologist       Objective:   Physical Exam General appearance:well nourished; no acute distress or increased work of breathing is present.  No  lymphadenopathy about the head, neck, or axilla noted.   Eyes: No conjunctival inflammation or lid edema is present. There is no scleral icterus.  Ears:  External ear exam shows no significant lesions or deformities.  Otoscopic examination reveals clear canals, tympanic membranes are intact bilaterally without bulging, retraction, inflammation or discharge.  Nose:  External nasal examination shows no deformity or inflammation. Nasal mucosa are pink and moist without lesions or exudates. No septal dislocation or deviation.No obstruction to airflow.   Oral exam: Dental hygiene is good; lips and gums are healthy appearing.There is no oropharyngeal erythema or exudate noted.   Neck:  No deformities, thyromegaly, masses, or tenderness noted.   Supple with full range of motion without pain.   Heart:  Normal rate and regular rhythm. S1 and S2 normal without gallop, murmur, click, rub or other extra  sounds.   Lungs:Chest clear to auscultation; no wheezes, rhonchi,rales ,or rubs present.No increased work of breathing.  Breath sounds slightly decreased  Extremities:  No cyanosis, edema, or clubbing  noted . Minor ridging of the nails noted   Skin: Warm & dry w/o jaundice or tenting. Benign appearing small, polypoid skin tags right neck and supraclavicular area          Assessment & Plan:  #1 cough; the most likely etiology is Quiniupril 40 mg daily. I recommend changing this agent to losartan 100 mg daily and monitoring the cough.The pathophysiology of ACE-I induced cough  & its differentiation from classic asthma were all discussed.    #2 asthma; clinically this appears controlled at this time. If she uses the albuterol more than twice a week a wakes up more than twice a month short of breath; she should be on the maintenance agent Symbicort. She will be given a refill of the generic Singulair. If the cough fails to resolve off the quinapril; this should be filled.

## 2011-12-10 NOTE — Patient Instructions (Addendum)
Evaluate the cough off the quinapril; if it persists fill the prescription for generic Singulair. Eat a low-fat diet with lots of fruits and vegetables, up to 7-9 servings per day. Consume less than 30 (PREFERABLY ZERO) grams of sugar per day from foods & drinks with High Fructose Corn Syrup (HFCS) sugar as #1,2,3 or # 4 on label.[employer], [employer] & [employer] do not carry products with HFCS. Follow a  low carb nutrition program such as [place] or [employer]  to prevent Diabetes progression . White carbohydrates (potatoes, rice, bread, and pasta) have a high spike of sugar and a high load of sugar. For example a  baked potato has a cup of sugar and a  french fry  2 teaspoons of sugar. Yams, wild  rice, whole grained bread &  wheat pasta have been much lower spike and load of  sugar. Portions should be the size of a deck of cards or your palm.

## 2011-12-15 ENCOUNTER — Other Ambulatory Visit: Payer: Self-pay | Admitting: Internal Medicine

## 2011-12-17 NOTE — Telephone Encounter (Signed)
Refill done.  

## 2012-02-25 ENCOUNTER — Ambulatory Visit (INDEPENDENT_AMBULATORY_CARE_PROVIDER_SITE_OTHER): Payer: BC Managed Care – PPO | Admitting: Endocrinology

## 2012-02-25 ENCOUNTER — Encounter: Payer: Self-pay | Admitting: Endocrinology

## 2012-02-25 VITALS — BP 128/82 | HR 98 | Temp 98.0°F | Resp 15 | Wt 214.1 lb

## 2012-02-25 DIAGNOSIS — E119 Type 2 diabetes mellitus without complications: Secondary | ICD-10-CM

## 2012-02-25 MED ORDER — INSULIN DETEMIR 100 UNIT/ML ~~LOC~~ SOLN: 350.0000 [IU] | SUBCUTANEOUS | Status: DC

## 2012-02-25 MED ORDER — "INSULIN SYRINGE-NEEDLE U-100 31G X 5/16"" 1 ML MISC": 4.0000 | Freq: Every day | Status: DC

## 2012-02-25 NOTE — Patient Instructions (Addendum)
blood tests are being requested for you today.  You will be contacted with results.   please increase levemir to 350 units each morning.  Please make a follow-up appointment in 1 month.  check your blood sugar 2 times a day.  vary the time of day when you check, between before the 3 meals, and at bedtime.  also check if you have symptoms of your blood sugar being too high or too low.  please keep a record of the readings and bring it to your next appointment here.  please call us sooner if you are having low blood sugar episodes, or if it stays over 200.

## 2012-02-25 NOTE — Progress Notes (Signed)
Subjective:    Patient ID: Rebekah Kennedy, female    DOB: 08/14/1955, 56 y.o.   MRN: [id number]  HPI Pt returns for f/u of insulin-requiring DM (dx'ed 2004; complicated by CVA, nephropathy, and retinopathy; she has done better with a simpler insulin regimen).  no cbg record, but states cbg's vary from 130-400.  It is in general higher as the day goes on Past Medical History  Diagnosis Date  . DIABETES MELLITUS, TYPE II 12/01/2006  . HYPERLIPIDEMIA 12/01/2006  . HYPERTENSION 07/07/2006  . DEPRESSION 03/16/2007  . CHEST PAIN 01/23/2007  . ASTHMA NOS W/ACUTE EXACERBATION 07/10/2010  . Obesity   . Splenomegaly     in college  . Dysmenorrhea   . Fibroid   . TIA (transient ischemic attack) 2012  . Stroke     Past Surgical History  Procedure Date  . Appendectomy   . Myomectomy   . Ovarian cyst removal   . 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 Breast surgery     Reduction  . Pelvic laparoscopy 75,88    DL lysis of adhesions  . Dilation and curettage of uterus 1975    DUB  . Biopsy thyroid 05/02/11    Nonneoplastic goiter    History   Social History  . Marital Status: Single    Spouse Name: N/A    Number of Children: 2  . Years of Education: N/A   Occupational History  . TEACHER    Social History Main Topics  . Smoking status: Never Smoker   . Smokeless tobacco: Never Used  . Alcohol Use: No     very rare  . Drug Use: No  . Sexually Active: Yes    Birth Control/ Protection: Post-menopausal   Other Topics Concern  . Not on file   Social History Narrative  . No narrative on file    Current Outpatient Prescriptions on File Prior to Visit  Medication Sig Dispense Refill  . albuterol (VENTOLIN HFA) 108 (90 BASE) MCG/ACT inhaler Inhale 2 puffs into the lungs every 4 (four) hours as needed for wheezing. 1-2 puffs every 4 hours as needed for SOB  18 g  2  . amLODipine (NORVASC) 10 MG tablet TAKE ONE TABLET BY MOUTH ONE TIME DAILY  30 tablet  5  . atorvastatin (LIPITOR) 40  MG tablet TAKE  ONE TABLET BY MOUTH NIGHTLY AT BEDTIME  30 tablet  4  . budesonide-formoterol (SYMBICORT) 160-4.5 MCG/ACT inhaler Inhale 2 puffs into the lungs 2 (two) times daily.        . cholecalciferol (VITAMIN D) 1000 UNITS tablet Take 1,000 Units by mouth daily.      . clopidogrel (PLAVIX) 75 MG tablet Take 1 tablet (75 mg total) by mouth daily. stop taking aspirin  30 tablet  11  . losartan (COZAAR) 100 MG tablet Take 1 tablet (100 mg total) by mouth daily.  30 tablet  2  . metoprolol (TOPROL-XL) 100 MG 24 hr tablet Take 1 tablet (100 mg total) by mouth daily.  90 tablet  2  . montelukast (SINGULAIR) 10 MG tablet Take 1 tablet (10 mg total) by mouth at bedtime. 1 tablet by mouth once daily (necessary for asthma control)  30 tablet  11  . Multiple Vitamin (MULTIVITAMIN) tablet Take 1 tablet by mouth daily.        Marland Kitchen spironolactone (ALDACTONE) 25 MG tablet Take 1 tablet (25 mg total) by mouth 2 (two) times daily.  180 tablet  2  . DISCONTD:  insulin detemir (LEVEMIR) 100 UNIT/ML injection Inject 300 Units into the skin every morning.  100 mL  11    Allergies  Allergen Reactions  . Quinapril     cough    Family History  Problem Relation Age of Onset  . Cancer Maternal Grandmother     Colon Cancer  . Asthma Maternal Grandmother   . Diabetes Father   . Heart disease Father   . Hypertension Father   . Hypertension Mother   . Heart disease Mother   . Ovarian cancer Mother   . Cancer Mother     Lung cancer  . Asthma Mother   . COPD Mother   . Diabetes Brother   . Hypertension Brother   . Kidney disease Brother   . Asthma Brother   . Graves' disease Sister   . Graves' disease Paternal Grandmother     BP 128/82  Pulse 98  Temp 98 F (36.7 C) (Oral)  Resp 15  Wt 214 lb 1 oz (97.098 kg)  SpO2 94%  Review of Systems denies hypoglycemia    Objective:   Physical Exam VITAL SIGNS:  See vs page GENERAL: no distress SKIN:  Insulin injection sites at the anterior abdomen are  normal, except for a few ecchymoses.     Assessment & Plan:  DM, needs increased rx

## 2012-02-26 ENCOUNTER — Ambulatory Visit: Payer: BC Managed Care – PPO | Admitting: Endocrinology

## 2012-02-26 ENCOUNTER — Telehealth: Payer: Self-pay

## 2012-02-26 NOTE — Telephone Encounter (Signed)
Pt advised of results and 2 week f/u appt made

## 2012-02-26 NOTE — Progress Notes (Signed)
Pt advised per dr. Ellison's message and states an understanding 

## 2012-02-28 ENCOUNTER — Ambulatory Visit (INDEPENDENT_AMBULATORY_CARE_PROVIDER_SITE_OTHER): Payer: BC Managed Care – PPO | Admitting: Obstetrics and Gynecology

## 2012-02-28 ENCOUNTER — Encounter: Payer: Self-pay | Admitting: Obstetrics and Gynecology

## 2012-02-28 VITALS — BP 124/78 | Ht 65.0 in | Wt 214.0 lb

## 2012-02-28 DIAGNOSIS — N83209 Unspecified ovarian cyst, unspecified side: Secondary | ICD-10-CM

## 2012-02-28 DIAGNOSIS — Z01419 Encounter for gynecological examination (general) (routine) without abnormal findings: Secondary | ICD-10-CM

## 2012-02-28 MED ORDER — TERCONAZOLE 0.8 % VA CREA: 1.0000 | TOPICAL_CREAM | Freq: Every day | VAGINAL | Status: DC

## 2012-02-28 NOTE — Patient Instructions (Signed)
Schedule ultrasound

## 2012-02-28 NOTE — Progress Notes (Signed)
Patient came back to see me today for annual GYN exam. She is menopausal. She is not having hot flashes. We have been watching her with fibroids. She had a normal bone density in 2011. She is not sexually active. She is not having vaginal bleeding. She is not having pelvic pain. She had a myomectomy in 1991 but has had some other fibroids recur. She was treated for dysplasia of her cervix prior to 1990 in  another office. She has had normal Pap smears since then. Her last Pap smear was 2012.  Physical examination: HEENT within normal limits. Neck: Thyroid not large. No masses. Supraclavicular nodes: not enlarged. Breasts: Examined in both sitting and lying  position. No skin changes and no masses. Abdomen: Soft no guarding rebound or masses or hernia. Pelvic: External: Within normal limits. BUS: Within normal limits. Vaginal:within normal limits. Poor estrogen effect. No evidence of cystocele rectocele or enterocele. Cervix: clean. Uterus: enlarged by fibroids to 10 weeks . Adnexa: No masses but difficult to examine due to fibroids.  Rectovaginal exam: Confirmatory and negative. Extremities: Within normal limits.  Assessment: #1. Fibroids #2. 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 #3. Atrophic vaginitis-asymptomatic  Plan: I told patient pelvic exam the same is last year. We did schedule ultrasound however due to difficulty feeling her ovaries. We discussed vaginal estrogen if she became sexually active. Pap not done.The new Pap smear guidelines were discussed with the patient.

## 2012-02-29 LAB — URINALYSIS W MICROSCOPIC + REFLEX CULTURE
Bilirubin Urine: NEGATIVE
Casts: NONE SEEN
Glucose, UA: >1000 mg/dL — AB
Hgb urine dipstick: NEGATIVE
Leukocytes, UA: NEGATIVE
Squamous Epithelial / LPF: NONE SEEN
pH: 7 (ref 5.0–8.0)

## 2012-03-06 ENCOUNTER — Encounter: Payer: Self-pay | Admitting: Obstetrics and Gynecology

## 2012-03-11 ENCOUNTER — Encounter: Payer: Self-pay | Admitting: Endocrinology

## 2012-03-11 ENCOUNTER — Ambulatory Visit (INDEPENDENT_AMBULATORY_CARE_PROVIDER_SITE_OTHER): Payer: BC Managed Care – PPO | Admitting: Endocrinology

## 2012-03-11 VITALS — BP 118/78 | HR 109 | Temp 98.3°F | Resp 16 | Wt 214.4 lb

## 2012-03-11 DIAGNOSIS — E119 Type 2 diabetes mellitus without complications: Secondary | ICD-10-CM

## 2012-03-11 MED ORDER — INSULIN DETEMIR 100 UNIT/ML ~~LOC~~ SOLN: 400.0000 [IU] | SUBCUTANEOUS | Status: DC

## 2012-03-11 NOTE — Progress Notes (Signed)
Subjective:    Patient ID: Rebekah Kennedy, female    DOB: 12-23-1955, 56 y.o.   MRN: [id number]  HPI Pt returns for f/u of insulin-requiring DM (dx'ed 2004; complicated by CVA, nephropathy, and retinopathy; she has done better with a simpler insulin regimen).  She forgets her insulin 2-3 times per week.  no cbg record, but states cbg's are mostly in the 100's when she takes insulin as rx'ed.  She says she sometimes skips her insulin, because she believes she needs to take this with a meal.  Past Medical History  Diagnosis Date  . DIABETES MELLITUS, TYPE II 12/01/2006  . HYPERLIPIDEMIA 12/01/2006  . HYPERTENSION 07/07/2006  . DEPRESSION 03/16/2007  . CHEST PAIN 01/23/2007  . ASTHMA NOS W/ACUTE EXACERBATION 07/10/2010  . Obesity   . Splenomegaly     in college  . Dysmenorrhea   . Fibroid   . TIA (transient ischemic attack) 2012  . Stroke   . Cervical dysplasia     Past Surgical History  Procedure Date  . Appendectomy   . Myomectomy   . Ovarian cyst removal   . 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 Breast surgery     Reduction  . Pelvic laparoscopy 75,88    DL lysis of adhesions  . Dilation and curettage of uterus 1975    DUB  . Biopsy thyroid 05/02/11    Nonneoplastic goiter  . Gynecologic cryosurgery   . Colposcopy     History   Social History  . Marital Status: Single    Spouse Name: N/A    Number of Children: 2  . Years of Education: N/A   Occupational History  . TEACHER    Social History Main Topics  . Smoking status: Never Smoker   . Smokeless tobacco: Never Used  . Alcohol Use: Yes     very rare  . Drug Use: No  . Sexually Active: Not Currently    Birth Control/ Protection: Post-menopausal   Other Topics Concern  . Not on file   Social History Narrative  . No narrative on file    Current Outpatient Prescriptions on File Prior to Visit  Medication Sig Dispense Refill  . albuterol (VENTOLIN HFA) 108 (90 BASE) MCG/ACT inhaler Inhale 2 puffs into the  lungs every 4 (four) hours as needed for wheezing. 1-2 puffs every 4 hours as needed for SOB  18 g  2  . amLODipine (NORVASC) 10 MG tablet TAKE ONE TABLET BY MOUTH ONE TIME DAILY  30 tablet  5  . atorvastatin (LIPITOR) 40 MG tablet TAKE  ONE TABLET BY MOUTH NIGHTLY AT BEDTIME  30 tablet  4  . budesonide-formoterol (SYMBICORT) 160-4.5 MCG/ACT inhaler Inhale 2 puffs into the lungs 2 (two) times daily.        . cholecalciferol (VITAMIN D) 1000 UNITS tablet Take 1,000 Units by mouth daily.      Marland Kitchen desonide (DESOWEN) 0.05 % cream       . insulin detemir (LEVEMIR) 100 UNIT/ML injection Inject 400 Units into the skin every morning.  140 mL  11  . Insulin Syringe-Needle U-100 (B-D INS SYR ULTRAFINE 1CC/31G) 31G X 5/16" 1 ML MISC 4 Devices by Other route daily.  120 each  11  . ketoconazole (NIZORAL) 2 % cream       . losartan (COZAAR) 100 MG tablet Take 1 tablet (100 mg total) by mouth daily.  30 tablet  2  . metoprolol (TOPROL-XL) 100 MG 24 hr tablet Take 1  tablet (100 mg total) by mouth daily.  90 tablet  2  . montelukast (SINGULAIR) 10 MG tablet Take 1 tablet (10 mg total) by mouth at bedtime. 1 tablet by mouth once daily (necessary for asthma control)  30 tablet  11  . Multiple Vitamin (MULTIVITAMIN) tablet Take 1 tablet by mouth daily.        Marland Kitchen spironolactone (ALDACTONE) 25 MG tablet Take 1 tablet (25 mg total) by mouth 2 (two) times daily.  180 tablet  2  . terconazole (TERAZOL 3) 0.8 % vaginal cream Place 1 applicator vaginally at bedtime.  20 g  10    Allergies  Allergen Reactions  . Quinapril     cough    Family History  Problem Relation Age of Onset  . Cancer Maternal Grandmother     Colon Cancer  . Asthma Maternal Grandmother   . Diabetes Father   . Heart disease Father   . Hypertension Father   . Hypertension Mother   . Heart disease Mother   . Ovarian cancer Mother   . Cancer Mother     Lung cancer  . Asthma Mother   . COPD Mother   . Diabetes Brother   . Hypertension  Brother   . Kidney disease Brother   . Asthma Brother   . Graves' disease Sister   . Graves' disease Paternal Grandmother   . Hypertension Paternal Grandmother   . Heart disease Paternal Grandmother   . Alzheimer's disease Paternal Grandmother     BP 118/78  Pulse 109  Temp 98.3 F (36.8 C) (Oral)  Resp 16  Wt 214 lb 7 oz (97.268 kg)  SpO2 98%   Review of Systems denies hypoglycemia    Objective:   Physical Exam VITAL SIGNS:  See vs page GENERAL: no distress PSYCH: Alert and oriented x 3.  Does not appear anxious nor depressed.     Lab Results  Component Value Date   HGBA1C 11.0* 02/25/2012      Assessment & Plan:  DM, therapy limited by noncompliance.  i'll do the best i can.

## 2012-03-11 NOTE — Patient Instructions (Addendum)
please continue levemir, 400 units each morning.  It is important to take this daily, but don't worry about eating when you take it.  Please make a follow-up appointment in 4 weeks. check your blood sugar 2 times a day.  vary the time of day when you check, between before the 3 meals, and at bedtime.  also check if you have symptoms of your blood sugar being too high or too low.  please keep a record of the readings and bring it to your next appointment here.  please call us sooner if you are having low blood sugar episodes, or if it stays over 200.

## 2012-03-19 ENCOUNTER — Ambulatory Visit (INDEPENDENT_AMBULATORY_CARE_PROVIDER_SITE_OTHER): Payer: BC Managed Care – PPO

## 2012-03-19 ENCOUNTER — Ambulatory Visit (INDEPENDENT_AMBULATORY_CARE_PROVIDER_SITE_OTHER): Payer: BC Managed Care – PPO | Admitting: Obstetrics and Gynecology

## 2012-03-19 DIAGNOSIS — D251 Intramural leiomyoma of uterus: Secondary | ICD-10-CM

## 2012-03-19 DIAGNOSIS — N854 Malposition of uterus: Secondary | ICD-10-CM

## 2012-03-19 DIAGNOSIS — D219 Benign neoplasm of connective and other soft tissue, unspecified: Secondary | ICD-10-CM

## 2012-03-19 DIAGNOSIS — D259 Leiomyoma of uterus, unspecified: Secondary | ICD-10-CM

## 2012-03-19 DIAGNOSIS — N952 Postmenopausal atrophic vaginitis: Secondary | ICD-10-CM

## 2012-03-19 DIAGNOSIS — N83339 Acquired atrophy of ovary and fallopian tube, unspecified side: Secondary | ICD-10-CM

## 2012-03-19 DIAGNOSIS — N83209 Unspecified ovarian cyst, unspecified side: Secondary | ICD-10-CM

## 2012-03-19 NOTE — Progress Notes (Signed)
Patient came back to see me today for pelvic ultrasound due to fibroids making it difficult to feel her ovaries. On ultrasound she has multiple fibroids. They are comparable in size to  last year. Both ovaries are seen And are normal. Her endometrial echo is thin at 2.4 mm. Her cul-de-sac was free of fluid.  Assessment: Fibroid uterus. Atrophic vaginitis.  Plan: Patient reassured about her ovaries. Return to office in one year. She will try hyalogyn gel and if not helping call for vaginal estrogen.

## 2012-03-19 NOTE — Patient Instructions (Signed)
Return in one year.

## 2012-03-31 ENCOUNTER — Ambulatory Visit: Payer: BC Managed Care – PPO | Admitting: Endocrinology

## 2012-04-07 ENCOUNTER — Ambulatory Visit: Payer: BC Managed Care – PPO | Admitting: Endocrinology

## 2012-04-23 ENCOUNTER — Ambulatory Visit (INDEPENDENT_AMBULATORY_CARE_PROVIDER_SITE_OTHER): Payer: BC Managed Care – PPO | Admitting: Endocrinology

## 2012-04-23 ENCOUNTER — Encounter: Payer: Self-pay | Admitting: Endocrinology

## 2012-04-23 VITALS — BP 130/70 | HR 96 | Temp 98.7°F | Wt 215.0 lb

## 2012-04-23 DIAGNOSIS — E1039 Type 1 diabetes mellitus with other diabetic ophthalmic complication: Secondary | ICD-10-CM

## 2012-04-23 DIAGNOSIS — E1065 Type 1 diabetes mellitus with hyperglycemia: Secondary | ICD-10-CM

## 2012-04-23 NOTE — Patient Instructions (Addendum)
please continue levemir, 400 units each morning.  It is important to take this daily, but don't worry about eating when you take it.  Please make a follow-up appointment in 2 months. check your blood sugar 2 times a day.  vary the time of day when you check, between before the 3 meals, and at bedtime.  also check if you have symptoms of your blood sugar being too high or too low.  please keep a record of the readings and bring it to your next appointment here.  please call us sooner if you are having low blood sugar episodes, or if it stays over 200.

## 2012-04-23 NOTE — Progress Notes (Signed)
Subjective:    Patient ID: Rebekah Kennedy, female    DOB: 31-Oct-1955, 56 y.o.   MRN: [id number]  HPI Pt returns for f/u of insulin-requiring DM (dx'ed 2004; complicated by CVA, nephropathy, and retinopathy; she has done better with a simpler insulin regimen, but therapy limited by noncompliance with insulin injections).  She forgets her insulin 2-3 times per week.  no cbg record, but states cbg's are mostly in the 100's when she takes insulin as rx'ed.  She says she misses her insulin approx 3x a week.   Past Medical History  Diagnosis Date  . DIABETES MELLITUS, TYPE II 12/01/2006  . HYPERLIPIDEMIA 12/01/2006  . HYPERTENSION 07/07/2006  . DEPRESSION 03/16/2007  . CHEST PAIN 01/23/2007  . ASTHMA NOS W/ACUTE EXACERBATION 07/10/2010  . Obesity   . Splenomegaly     in college  . Dysmenorrhea   . Fibroid   . TIA (transient ischemic attack) 2012  . Stroke   . Cervical dysplasia     Past Surgical History  Procedure Date  . Appendectomy   . Myomectomy   . Ovarian cyst removal   . 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 Breast surgery     Reduction  . Pelvic laparoscopy 75,88    DL lysis of adhesions  . Dilation and curettage of uterus 1975    DUB  . Biopsy thyroid 05/02/11    Nonneoplastic goiter  . Gynecologic cryosurgery   . Colposcopy     History   Social History  . Marital Status: Single    Spouse Name: N/A    Number of Children: 2  . Years of Education: N/A   Occupational History  . TEACHER    Social History Main Topics  . Smoking status: Never Smoker   . Smokeless tobacco: Never Used  . Alcohol Use: Yes     Comment: very rare  . Drug Use: No  . Sexually Active: Not Currently    Birth Control/ Protection: Post-menopausal   Other Topics Concern  . Not on file   Social History Narrative  . No narrative on file    Current Outpatient Prescriptions on File Prior to Visit  Medication Sig Dispense Refill  . albuterol (VENTOLIN HFA) 108 (90 BASE) MCG/ACT inhaler  Inhale 2 puffs into the lungs every 4 (four) hours as needed for wheezing. 1-2 puffs every 4 hours as needed for SOB  18 g  2  . amLODipine (NORVASC) 10 MG tablet TAKE ONE TABLET BY MOUTH ONE TIME DAILY  30 tablet  5  . atorvastatin (LIPITOR) 40 MG tablet TAKE  ONE TABLET BY MOUTH NIGHTLY AT BEDTIME  30 tablet  4  . budesonide-formoterol (SYMBICORT) 160-4.5 MCG/ACT inhaler Inhale 2 puffs into the lungs 2 (two) times daily.        . cholecalciferol (VITAMIN D) 1000 UNITS tablet Take 1,000 Units by mouth daily.      . clopidogrel (PLAVIX) 75 MG tablet       . desonide (DESOWEN) 0.05 % cream       . insulin detemir (LEVEMIR) 100 UNIT/ML injection Inject 400 Units into the skin every morning.  140 mL  11  . Insulin Syringe-Needle U-100 (B-D INS SYR ULTRAFINE 1CC/31G) 31G X 5/16" 1 ML MISC 4 Devices by Other route daily.  120 each  11  . ketoconazole (NIZORAL) 2 % cream       . losartan (COZAAR) 100 MG tablet Take 1 tablet (100 mg total) by mouth daily.  30  tablet  2  . metoprolol (TOPROL-XL) 100 MG 24 hr tablet Take 1 tablet (100 mg total) by mouth daily.  90 tablet  2  . montelukast (SINGULAIR) 10 MG tablet Take 1 tablet (10 mg total) by mouth at bedtime. 1 tablet by mouth once daily (necessary for asthma control)  30 tablet  11  . Multiple Vitamin (MULTIVITAMIN) tablet Take 1 tablet by mouth daily.        Marland Kitchen spironolactone (ALDACTONE) 25 MG tablet Take 1 tablet (25 mg total) by mouth 2 (two) times daily.  180 tablet  2  . terconazole (TERAZOL 3) 0.8 % vaginal cream Place 1 applicator vaginally at bedtime.  20 g  10    Allergies  Allergen Reactions  . Quinapril     cough    Family History  Problem Relation Age of Onset  . Cancer Maternal Grandmother     Colon Cancer  . Asthma Maternal Grandmother   . Diabetes Father   . Heart disease Father   . Hypertension Father   . Hypertension Mother   . Heart disease Mother   . Ovarian cancer Mother   . Cancer Mother     Lung cancer  . Asthma  Mother   . COPD Mother   . Diabetes Brother   . Hypertension Brother   . Kidney disease Brother   . Asthma Brother   . Graves' disease Sister   . Graves' disease Paternal Grandmother   . Hypertension Paternal Grandmother   . Heart disease Paternal Grandmother   . Alzheimer's disease Paternal Grandmother     BP 130/70  Pulse 96  Temp 98.7 F (37.1 C) (Oral)  Wt 215 lb (97.523 kg)  SpO2 96%   Review of Systems denies hypoglycemia    Objective:   Physical Exam VITAL SIGNS:  See vs page GENERAL: no distress PSYCH: Alert and oriented x 3.  Does not appear anxious nor depressed. Gait: normal and painless      Assessment & Plan:  DM: therapy limited by severe noncompliance with insulin injections.  i'll do the best i can.

## 2012-05-20 ENCOUNTER — Other Ambulatory Visit: Payer: Self-pay | Admitting: Neurology

## 2012-06-20 ENCOUNTER — Ambulatory Visit (INDEPENDENT_AMBULATORY_CARE_PROVIDER_SITE_OTHER): Payer: BC Managed Care – PPO | Admitting: Internal Medicine

## 2012-06-20 ENCOUNTER — Encounter: Payer: Self-pay | Admitting: Internal Medicine

## 2012-06-20 VITALS — BP 126/80 | HR 97 | Wt 216.0 lb

## 2012-06-20 DIAGNOSIS — R202 Paresthesia of skin: Secondary | ICD-10-CM

## 2012-06-20 DIAGNOSIS — R209 Unspecified disturbances of skin sensation: Secondary | ICD-10-CM

## 2012-06-20 DIAGNOSIS — M25511 Pain in right shoulder: Secondary | ICD-10-CM

## 2012-06-20 DIAGNOSIS — M25519 Pain in unspecified shoulder: Secondary | ICD-10-CM

## 2012-06-20 MED ORDER — CLOPIDOGREL BISULFATE 75 MG PO TABS: 75.0000 mg | ORAL_TABLET | Freq: Every day | ORAL | Status: DC

## 2012-06-20 NOTE — Progress Notes (Signed)
  Subjective:    Patient ID: Rebekah Kennedy, female    DOB: 1956-05-05, 57 y.o.   MRN: [id number]  HPI The pain began several months ago in R shoulder without associated injury or trigger. It is described as excruciatingly sharp  up to level 10 with elevation. The pain does not radiate. The discomfort last seconds until RUE lowered. It can occur in RLDP.  There is no associated  redness ,swelling, stiffness, skin color change, or temperature change. The pain was treated with topical Voltaren,Aspercreme & BenGay with temporary if any response .                                                                                     Review of Systems Negative or absent signs& symptoms are delineated below: Constitutional: no fever, chills, sweats, change in weight  Musculoskeletal:some foot & lower leg  cramps  ; no  joint stiffness, redness, or swelling Skin:no rash, color/temp change Neuro: no weakness; incontinence (stool/urine). Constant numbness 3rd R finger few weeks Heme:no lymphadenopathy; abnormal bruising or bleeding      Objective:   Physical Exam Gen.:  well-nourished in appearance. Alert, appropriate and cooperative throughout exam.  Neck: No deformities, masses, or tenderness noted. Range of motion normal.                             Musculoskeletal/extremities: Accentuated curvature of upper thoracic  spine.  No clubbing, cyanosis, edema, or significant extremity  deformity noted. Range of motion normal .Tone & strength  normal.Joints normal. Nail health good.  Pain at the right anterior shoulder with elevation of 90 from rest Vascular: Radial artery pulses are full and equal.  Neurologic: Alert and oriented x3. Deep tendon reflexes symmetrical and normal. Decreased sensation of the third right finger to touch Skin: Intact without suspicious lesions or rashes. Lymph: No cervical, axillary lymphadenopathy present. Psych: Mood and affect are normal. Normally interactive              Assessment & Plan:  #1 right shoulder pain, probable rotator cuff tear  #2 isolated  paresthesias third right finger. Injury to the digital nerve bilaterally suggested Plan: See orders and recommendations

## 2012-06-20 NOTE — Patient Instructions (Addendum)
Review and correct the record as indicated. Please share record with all medical staff seen.   If you activate My Chart; the results can be released to you as soon as they populate from the lab. If you choose not to use this program; the labs have to be reviewed, copied & mailed   causing a delay in getting the results to you.  

## 2012-06-24 ENCOUNTER — Ambulatory Visit: Payer: BC Managed Care – PPO | Admitting: Endocrinology

## 2012-06-30 ENCOUNTER — Encounter: Payer: Self-pay | Admitting: Internal Medicine

## 2012-07-08 ENCOUNTER — Telehealth: Payer: Self-pay | Admitting: Internal Medicine

## 2012-07-08 NOTE — Telephone Encounter (Signed)
In reference to Orthopaedic referral entered 06/20/12, as of today, 07/08/12, patient has not responded.  [facility] attempted to reach patient to schedule, I have tried to reach patient, and also I mailed patient a reminder letter to schedule.  Patient is not responding.

## 2012-07-14 ENCOUNTER — Encounter: Payer: Self-pay | Admitting: Endocrinology

## 2012-07-14 ENCOUNTER — Ambulatory Visit (INDEPENDENT_AMBULATORY_CARE_PROVIDER_SITE_OTHER): Payer: BC Managed Care – PPO | Admitting: Endocrinology

## 2012-07-14 VITALS — BP 122/80 | HR 94 | Wt 215.0 lb

## 2012-07-14 DIAGNOSIS — E119 Type 2 diabetes mellitus without complications: Secondary | ICD-10-CM

## 2012-07-14 NOTE — Progress Notes (Signed)
Subjective:    Patient ID: Rebekah Kennedy, female    DOB: 1955/07/25, 57 y.o.   MRN: [id number]  HPI Pt returns for f/u of insulin-requiring DM (dx'ed 2004; complicated by CVA, nephropathy, and retinopathy; she has done better with a simpler insulin regimen, but therapy limited by noncompliance with insulin injections; she has never had severe hypoglycemia).  She says she now forgets her insulin less than once per week.  no cbg record, but states cbg's are "mostly ok." Past Medical History  Diagnosis Date  . DIABETES MELLITUS, TYPE II 12/01/2006  . HYPERLIPIDEMIA 12/01/2006  . HYPERTENSION 07/07/2006  . DEPRESSION 03/16/2007  . CHEST PAIN 01/23/2007  . ASTHMA NOS W/ACUTE EXACERBATION 07/10/2010  . Obesity   . Splenomegaly     in college  . Dysmenorrhea   . Fibroid   . TIA (transient ischemic attack) 2012  . Stroke   . Cervical dysplasia     Past Surgical History  Procedure Laterality Date  . Appendectomy    . Myomectomy    . Ovarian cyst removal    . 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 Breast surgery      Reduction  . Pelvic laparoscopy  75,88    DL lysis of adhesions  . Dilation and curettage of uterus  1975    DUB  . Biopsy thyroid  05/02/11    Nonneoplastic goiter  . Gynecologic cryosurgery    . Colposcopy      History   Social History  . Marital Status: Single    Spouse Name: N/A    Number of Children: 2  . Years of Education: N/A   Occupational History  . TEACHER    Social History Main Topics  . Smoking status: Never Smoker   . Smokeless tobacco: Never Used  . Alcohol Use: Yes     Comment: very rare  . Drug Use: No  . Sexually Active: Not Currently    Birth Control/ Protection: Post-menopausal   Other Topics Concern  . Not on file   Social History Narrative  . No narrative on file    Current Outpatient Prescriptions on File Prior to Visit  Medication Sig Dispense Refill  . albuterol (VENTOLIN HFA) 108 (90 BASE) MCG/ACT inhaler Inhale 2 puffs into  the lungs every 4 (four) hours as needed for wheezing. 1-2 puffs every 4 hours as needed for SOB  18 g  2  . amLODipine (NORVASC) 10 MG tablet TAKE ONE TABLET BY MOUTH ONE TIME DAILY  30 tablet  5  . atorvastatin (LIPITOR) 40 MG tablet TAKE  ONE TABLET BY MOUTH NIGHTLY AT BEDTIME  30 tablet  4  . budesonide-formoterol (SYMBICORT) 160-4.5 MCG/ACT inhaler Inhale 2 puffs into the lungs 2 (two) times daily.        . clopidogrel (PLAVIX) 75 MG tablet Take 1 tablet (75 mg total) by mouth daily.  90 tablet  1  . desonide (DESOWEN) 0.05 % cream       . insulin detemir (LEVEMIR) 100 UNIT/ML injection Inject 400 Units into the skin every morning.  140 mL  11  . Insulin Syringe-Needle U-100 (B-D INS SYR ULTRAFINE 1CC/31G) 31G X 5/16" 1 ML MISC 4 Devices by Other route daily.  120 each  11  . ketoconazole (NIZORAL) 2 % cream       . losartan (COZAAR) 100 MG tablet Take 1 tablet (100 mg total) by mouth daily.  30 tablet  2  . metoprolol (TOPROL-XL) 100 MG  24 hr tablet Take 1 tablet (100 mg total) by mouth daily.  90 tablet  2  . montelukast (SINGULAIR) 10 MG tablet Take 1 tablet (10 mg total) by mouth at bedtime. 1 tablet by mouth once daily (necessary for asthma control)  30 tablet  11  . Multiple Vitamin (MULTIVITAMIN) tablet Take 1 tablet by mouth daily.        Marland Kitchen spironolactone (ALDACTONE) 25 MG tablet Take 1 tablet (25 mg total) by mouth 2 (two) times daily.  180 tablet  2  . terconazole (TERAZOL 3) 0.8 % vaginal cream Place 1 applicator vaginally at bedtime.  20 g  10   No current facility-administered medications on file prior to visit.    Allergies  Allergen Reactions  . Quinapril     cough    Family History  Problem Relation Age of Onset  . Cancer Maternal Grandmother     Colon Cancer  . Asthma Maternal Grandmother   . Diabetes Father   . Heart disease Father   . Hypertension Father   . Hypertension Mother   . Heart disease Mother   . Ovarian cancer Mother   . Cancer Mother     Lung  cancer  . Asthma Mother   . COPD Mother   . Diabetes Brother   . Hypertension Brother   . Kidney disease Brother   . Asthma Brother   . Graves' disease Sister   . Graves' disease Paternal Grandmother   . Hypertension Paternal Grandmother   . Heart disease Paternal Grandmother   . Alzheimer's disease Paternal Grandmother     BP 122/80  Pulse 94  Wt 215 lb (97.523 kg)  BMI 35.78 kg/m2  SpO2 97%  Review of Systems denies hypoglycemia    Objective:   Physical Exam VITAL SIGNS:  See vs page GENERAL: no distress Pulses: dorsalis pedis intact bilat.   Feet: no deformity.  no ulcer on the feet.  feet are of normal color and temp.  no edema Neuro: sensation is intact to touch on the feet.       Assessment & Plan:  DM: therapy limited by noncompliance with cbg's.  i'll do the best i can.  We may have to dose insulin based on a1c

## 2012-07-14 NOTE — Patient Instructions (Addendum)
please continue levemir, 400 units each morning.  It is important to take this daily, but don't worry about eating when you take it.  Please make a follow-up appointment in 3 months.  check your blood sugar 2 times a day.  vary the time of day when you check, between before the 3 meals, and at bedtime.  also check if you have symptoms of your blood sugar being too high or too low.  please keep a record of the readings and bring it to your next appointment here.  please call us sooner if you are having low blood sugar episodes, or if it stays over 200.    blood tests are being requested for you today.  We'll contact you with results.

## 2012-07-15 ENCOUNTER — Other Ambulatory Visit: Payer: Self-pay | Admitting: Endocrinology

## 2012-07-15 MED ORDER — INSULIN DETEMIR 100 UNIT/ML ~~LOC~~ SOLN: 450.0000 [IU] | SUBCUTANEOUS | Status: DC

## 2012-07-28 ENCOUNTER — Other Ambulatory Visit: Payer: Self-pay | Admitting: Internal Medicine

## 2012-08-23 ENCOUNTER — Other Ambulatory Visit: Payer: Self-pay | Admitting: Internal Medicine

## 2012-10-08 ENCOUNTER — Encounter: Payer: Self-pay | Admitting: Internal Medicine

## 2012-10-14 ENCOUNTER — Encounter: Payer: Self-pay | Admitting: Endocrinology

## 2012-10-14 ENCOUNTER — Ambulatory Visit (INDEPENDENT_AMBULATORY_CARE_PROVIDER_SITE_OTHER): Payer: BC Managed Care – PPO | Admitting: Endocrinology

## 2012-10-14 VITALS — BP 130/80 | HR 78 | Ht 66.0 in | Wt 215.0 lb

## 2012-10-14 DIAGNOSIS — E119 Type 2 diabetes mellitus without complications: Secondary | ICD-10-CM

## 2012-10-14 NOTE — Progress Notes (Signed)
Subjective:    Patient ID: Rebekah Kennedy, female    DOB: [birth year], 57 y.o.   MRN: [id number]  HPI Pt returns for f/u of insulin-requiring DM (dx'ed 2004; she has mild if any neuropathy of the lower extremities; she has associated complications of CVA, nephropathy, and retinopathy; she has done better with a simpler insulin regimen, but therapy has been limited by noncompliance with insulin injections and cbg monitoring; she has never had severe hypoglycemia or DKA).  She says she now forgets her insulin just once every few weeks.  no cbg record, but states cbg's vary from 132-300.   Past Medical History  Diagnosis Date  . DIABETES MELLITUS, TYPE II 12/01/2006  . HYPERLIPIDEMIA 12/01/2006  . HYPERTENSION 07/07/2006  . DEPRESSION 03/16/2007  . CHEST PAIN 01/23/2007  . ASTHMA NOS W/ACUTE EXACERBATION 07/10/2010  . Obesity   . Splenomegaly     in college  . Dysmenorrhea   . Fibroid   . TIA (transient ischemic attack) 2012  . Stroke   . Cervical dysplasia     Past Surgical History  Procedure Laterality Date  . Appendectomy    . Myomectomy    . Ovarian cyst removal    . 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 Breast surgery      Reduction  . Pelvic laparoscopy  75,88    DL lysis of adhesions  . Dilation and curettage of uterus  1975    DUB  . Biopsy thyroid  05/02/11    Nonneoplastic goiter  . Gynecologic cryosurgery    . Colposcopy      History   Social History  . Marital Status: Single    Spouse Name: N/A    Number of Children: 2  . Years of Education: N/A   Occupational History  . TEACHER    Social History Main Topics  . Smoking status: Never Smoker   . Smokeless tobacco: Never Used  . Alcohol Use: Yes     Comment: very rare  . Drug Use: No  . Sexually Active: Not Currently    Birth Control/ Protection: Post-menopausal   Other Topics Concern  . Not on file   Social History Narrative  . No narrative on file    Current Outpatient Prescriptions on File Prior to  Visit  Medication Sig Dispense Refill  . albuterol (VENTOLIN HFA) 108 (90 BASE) MCG/ACT inhaler Inhale 2 puffs into the lungs every 4 (four) hours as needed for wheezing. 1-2 puffs every 4 hours as needed for SOB  18 g  2  . amLODipine (NORVASC) 10 MG tablet TAKE ONE TABLET BY MOUTH ONE TIME DAILY  30 tablet  5  . atorvastatin (LIPITOR) 40 MG tablet TAKE  ONE TABLET BY MOUTH NIGHTLY AT BEDTIME  30 tablet  4  . budesonide-formoterol (SYMBICORT) 160-4.5 MCG/ACT inhaler Inhale 2 puffs into the lungs 2 (two) times daily.        . clopidogrel (PLAVIX) 75 MG tablet Take 1 tablet (75 mg total) by mouth daily.  90 tablet  1  . desonide (DESOWEN) 0.05 % cream       . insulin detemir (LEVEMIR) 100 UNIT/ML injection Inject 450 Units into the skin every morning.  150 mL  11  . Insulin Syringe-Needle U-100 (B-D INS SYR ULTRAFINE 1CC/31G) 31G X 5/16" 1 ML MISC 4 Devices by Other route daily.  120 each  11  . ketoconazole (NIZORAL) 2 % cream       . losartan (COZAAR) 100  MG tablet TAKE ONE TABLET BY MOUTH ONE TIME DAILY  30 tablet  5  . metoprolol succinate (TOPROL-XL) 100 MG 24 hr tablet TAKE ONE TABLET BY MOUTH ONE TIME DAILY  90 tablet  1  . montelukast (SINGULAIR) 10 MG tablet Take 1 tablet (10 mg total) by mouth at bedtime. 1 tablet by mouth once daily (necessary for asthma control)  30 tablet  11  . Multiple Vitamin (MULTIVITAMIN) tablet Take 1 tablet by mouth daily.        Marland Kitchen spironolactone (ALDACTONE) 25 MG tablet Take 1 tablet (25 mg total) by mouth 2 (two) times daily.  180 tablet  2  . terconazole (TERAZOL 3) 0.8 % vaginal cream Place 1 applicator vaginally at bedtime.  20 g  10   No current facility-administered medications on file prior to visit.    Allergies  Allergen Reactions  . Quinapril     cough    Family History  Problem Relation Age of Onset  . Cancer Maternal Grandmother     Colon Cancer  . Asthma Maternal Grandmother   . Diabetes Father   . Heart disease Father   .  Hypertension Father   . Hypertension Mother   . Heart disease Mother   . Ovarian cancer Mother   . Cancer Mother     Lung cancer  . Asthma Mother   . COPD Mother   . Diabetes Brother   . Hypertension Brother   . Kidney disease Brother   . Asthma Brother   . Graves' disease Sister   . Graves' disease Paternal Grandmother   . Hypertension Paternal Grandmother   . Heart disease Paternal Grandmother   . Alzheimer's disease Paternal Grandmother    BP 130/80  Pulse 78  Ht 5\' 6"  (1.676 m)  Wt 215 lb (97.523 kg)  BMI 34.72 kg/m2  SpO2 96%  Review of Systems denies hypoglycemia and weight change.      Objective:   Physical Exam VITAL SIGNS:  See vs page GENERAL: no distress     Assessment & Plan:  DM: this insulin regimen is chosen from the available options, due to its simplicity.  The benefit of glycemic control needs to be weighed against the risks of hypoglycemia.

## 2012-10-14 NOTE — Patient Instructions (Addendum)
please continue levemir, 450 units each morning.  It is important to take this daily, but don't worry about eating when you take it.  Please make a follow-up appointment in 3 months.  check your blood sugar 2 times a day.  vary the time of day when you check, between before the 3 meals, and at bedtime.  also check if you have symptoms of your blood sugar being too high or too low.  please keep a record of the readings and bring it to your next appointment here.  please call us sooner if you are having low blood sugar episodes, or if it stays over 200.    A diabetes blood test is requested for you today.  We'll contact you with results.

## 2012-10-15 LAB — HEMOGLOBIN A1C: Hgb A1c MFr Bld: 10.1 % — ABNORMAL HIGH (ref 4.6–6.5)

## 2012-11-06 ENCOUNTER — Telehealth: Payer: Self-pay | Admitting: *Deleted

## 2012-11-06 MED ORDER — INSULIN DETEMIR 100 UNIT/ML ~~LOC~~ SOLN: 500.0000 [IU] | SUBCUTANEOUS | Status: DC

## 2012-11-06 NOTE — Telephone Encounter (Signed)
Pt called stating that her rx for her levemir needs to be updated at her pharmacy. Pt states that her dosage was increased to 500 units. After looking through the lab notes, Dr Everardo All increased pt's dosage to 500 units each morning. Sending a new rx to [facility] for pt.

## 2012-11-28 ENCOUNTER — Telehealth: Payer: Self-pay | Admitting: Internal Medicine

## 2012-11-28 MED ORDER — AMLODIPINE BESYLATE 10 MG PO TABS: ORAL_TABLET | ORAL | Status: DC

## 2012-11-28 MED ORDER — SPIRONOLACTONE 25 MG PO TABS: 25.0000 mg | ORAL_TABLET | Freq: Two times a day (BID) | ORAL | Status: DC

## 2012-11-28 NOTE — Telephone Encounter (Signed)
Rx sent 

## 2012-11-28 NOTE — Telephone Encounter (Signed)
[facility] is calling about the patient's Amlodipine and Spironolactone refill requests. She states that we keep sending them the requests back stating that they need to e-scribe requests. [facility] says that they are e-scribing from their end but we are getting it through our fax machine apparently. They are requesting that we honor the patient's refill request because they have been sending it for two weeks.

## 2012-12-30 ENCOUNTER — Encounter: Payer: Self-pay | Admitting: Endocrinology

## 2012-12-30 ENCOUNTER — Ambulatory Visit (INDEPENDENT_AMBULATORY_CARE_PROVIDER_SITE_OTHER): Payer: BC Managed Care – PPO | Admitting: Endocrinology

## 2012-12-30 VITALS — BP 132/74 | HR 88 | Ht 66.0 in | Wt 218.0 lb

## 2012-12-30 DIAGNOSIS — E119 Type 2 diabetes mellitus without complications: Secondary | ICD-10-CM

## 2012-12-30 MED ORDER — INSULIN GLARGINE 100 UNIT/ML SOLOSTAR PEN: 300.0000 [IU] | PEN_INJECTOR | SUBCUTANEOUS | Status: DC

## 2012-12-30 NOTE — Progress Notes (Signed)
Subjective:    Patient ID: Rebekah Kennedy, female    DOB: [birth year], 56 y.o.   MRN: [id number]  HPI Pt returns for f/u of insulin-requiring DM (dx'ed 2004; she has mild if any neuropathy of the lower extremities; she has associated complications of CVA, nephropathy, and retinopathy; she has done better with a simpler insulin regimen, but therapy has been limited by noncompliance with insulin injections and cbg monitoring; she has never had severe hypoglycemia or DKA).  She says she now forgets her insulin once a week.  no cbg record, but states cbg's vary from 130-560.  She does not like the number of injections she has to take per dose.    Past Medical History  Diagnosis Date  . DIABETES MELLITUS, TYPE II 12/01/2006  . HYPERLIPIDEMIA 12/01/2006  . HYPERTENSION 07/07/2006  . DEPRESSION 03/16/2007  . CHEST PAIN 01/23/2007  . ASTHMA NOS W/ACUTE EXACERBATION 07/10/2010  . Obesity   . Splenomegaly     in college  . Dysmenorrhea   . Fibroid   . TIA (transient ischemic attack) 2012  . Stroke   . Cervical dysplasia     Past Surgical History  Procedure Laterality Date  . Appendectomy    . Myomectomy    . Ovarian cyst removal    . 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 Breast surgery      Reduction  . Pelvic laparoscopy  75,88    DL lysis of adhesions  . Dilation and curettage of uterus  1975    DUB  . Biopsy thyroid  05/02/11    Nonneoplastic goiter  . Gynecologic cryosurgery    . Colposcopy      History   Social History  . Marital Status: Single    Spouse Name: N/A    Number of Children: 2  . Years of Education: N/A   Occupational History  . TEACHER    Social History Main Topics  . Smoking status: Never Smoker   . Smokeless tobacco: Never Used  . Alcohol Use: Yes     Comment: very rare  . Drug Use: No  . Sexual Activity: Not Currently    Birth Control/ Protection: Post-menopausal   Other Topics Concern  . Not on file   Social History Narrative  . No narrative on  file    Current Outpatient Prescriptions on File Prior to Visit  Medication Sig Dispense Refill  . albuterol (VENTOLIN HFA) 108 (90 BASE) MCG/ACT inhaler Inhale 2 puffs into the lungs every 4 (four) hours as needed for wheezing. 1-2 puffs every 4 hours as needed for SOB  18 g  2  . amLODipine (NORVASC) 10 MG tablet TAKE ONE TABLET BY MOUTH ONE TIME DAILY  30 tablet  2  . atorvastatin (LIPITOR) 40 MG tablet TAKE  ONE TABLET BY MOUTH NIGHTLY AT BEDTIME  30 tablet  4  . budesonide-formoterol (SYMBICORT) 160-4.5 MCG/ACT inhaler Inhale 2 puffs into the lungs 2 (two) times daily.        . clopidogrel (PLAVIX) 75 MG tablet Take 1 tablet (75 mg total) by mouth daily.  90 tablet  1  . desonide (DESOWEN) 0.05 % cream       . Insulin Syringe-Needle U-100 (B-D INS SYR ULTRAFINE 1CC/31G) 31G X 5/16" 1 ML MISC 4 Devices by Other route daily.  120 each  11  . ketoconazole (NIZORAL) 2 % cream       . losartan (COZAAR) 100 MG tablet TAKE ONE TABLET BY MOUTH  ONE TIME DAILY  30 tablet  5  . metoprolol succinate (TOPROL-XL) 100 MG 24 hr tablet TAKE ONE TABLET BY MOUTH ONE TIME DAILY  90 tablet  1  . montelukast (SINGULAIR) 10 MG tablet Take 1 tablet (10 mg total) by mouth at bedtime. 1 tablet by mouth once daily (necessary for asthma control)  30 tablet  11  . Multiple Vitamin (MULTIVITAMIN) tablet Take 1 tablet by mouth daily.        Marland Kitchen spironolactone (ALDACTONE) 25 MG tablet Take 1 tablet (25 mg total) by mouth 2 (two) times daily.  180 tablet  0  . terconazole (TERAZOL 3) 0.8 % vaginal cream Place 1 applicator vaginally at bedtime.  20 g  10   No current facility-administered medications on file prior to visit.    Allergies  Allergen Reactions  . Quinapril     cough    Family History  Problem Relation Age of Onset  . Cancer Maternal Grandmother     Colon Cancer  . Asthma Maternal Grandmother   . Diabetes Father   . Heart disease Father   . Hypertension Father   . Hypertension Mother   . Heart  disease Mother   . Ovarian cancer Mother   . Cancer Mother     Lung cancer  . Asthma Mother   . COPD Mother   . Diabetes Brother   . Hypertension Brother   . Kidney disease Brother   . Asthma Brother   . Graves' disease Sister   . Graves' disease Paternal Grandmother   . Hypertension Paternal Grandmother   . Heart disease Paternal Grandmother   . Alzheimer's disease Paternal Grandmother    BP 132/74  Pulse 88  Ht 5\' 6"  (1.676 m)  Wt 218 lb (98.884 kg)  BMI 35.2 kg/m2  SpO2 98%  Review of Systems denies hypoglycemia and weight change.      Objective:   Physical Exam VITAL SIGNS:  See vs page GENERAL: no distress SKIN:  Insulin injection sites at the anterior abdomen are normal     Assessment & Plan:  DM: she needs increased rx.  This insulin regimen was chosen from multiple options, for its simplicity.  An insulin requiring fewer injections per dose would effectively further simplify her regimen.  The benefits of glycemic control must be weighed against the risks of hypoglycemia.

## 2012-12-30 NOTE — Patient Instructions (Addendum)
please change levemir to lantus, 300 units each morning.  It is important to take this daily, but don't worry about eating when you take it.  Please make a follow-up appointment in 3 months.   check your blood sugar 2 times a day.  vary the time of day when you check, between before the 3 meals, and at bedtime.  also check if you have symptoms of your blood sugar being too high or too low.  please keep a record of the readings and bring it to your next appointment here.  please call us sooner if you are having low blood sugar episodes, or if it stays over 200.   Please come back for a follow-up appointment in 2 weeks.

## 2013-01-14 ENCOUNTER — Telehealth: Payer: Self-pay | Admitting: Endocrinology

## 2013-01-14 NOTE — Telephone Encounter (Signed)
caleld back pt, she stated pain was so severe that she was going to go to [place] or to urgent care

## 2013-01-21 ENCOUNTER — Other Ambulatory Visit: Payer: Self-pay | Admitting: General Practice

## 2013-01-21 ENCOUNTER — Other Ambulatory Visit: Payer: Self-pay | Admitting: Internal Medicine

## 2013-01-21 MED ORDER — ATORVASTATIN CALCIUM 40 MG PO TABS: ORAL_TABLET | ORAL | Status: DC

## 2013-01-21 NOTE — Telephone Encounter (Signed)
Med filled.  

## 2013-02-18 ENCOUNTER — Encounter: Payer: Self-pay | Admitting: Internal Medicine

## 2013-02-18 ENCOUNTER — Ambulatory Visit (INDEPENDENT_AMBULATORY_CARE_PROVIDER_SITE_OTHER): Payer: BC Managed Care – PPO | Admitting: Internal Medicine

## 2013-02-18 VITALS — BP 136/82 | HR 89 | Temp 98.4°F | Resp 13 | Wt 224.8 lb

## 2013-02-18 DIAGNOSIS — R10819 Abdominal tenderness, unspecified site: Secondary | ICD-10-CM

## 2013-02-18 DIAGNOSIS — M792 Neuralgia and neuritis, unspecified: Secondary | ICD-10-CM

## 2013-02-18 DIAGNOSIS — E119 Type 2 diabetes mellitus without complications: Secondary | ICD-10-CM

## 2013-02-18 DIAGNOSIS — I6789 Other cerebrovascular disease: Secondary | ICD-10-CM

## 2013-02-18 DIAGNOSIS — G463 Brain stem stroke syndrome: Secondary | ICD-10-CM

## 2013-02-18 DIAGNOSIS — G5790 Unspecified mononeuropathy of unspecified lower limb: Secondary | ICD-10-CM

## 2013-02-18 LAB — CBC WITH DIFFERENTIAL/PLATELET
Basophils Absolute: 0 10*3/uL (ref 0.0–0.1)
Basophils Relative: 0.4 % (ref 0.0–3.0)
Eosinophils Relative: 2.4 % (ref 0.0–5.0)
HCT: 39.1 % (ref 36.0–46.0)
Hemoglobin: 12.8 g/dL (ref 12.0–15.0)
Lymphocytes Relative: 31.5 % (ref 12.0–46.0)
Lymphs Abs: 3.3 10*3/uL (ref 0.7–4.0)
MCV: 83.1 fl (ref 78.0–100.0)
Monocytes Relative: 5.8 % (ref 3.0–12.0)
Neutro Abs: 6.2 10*3/uL (ref 1.4–7.7)
RBC: 4.71 Mil/uL (ref 3.87–5.11)
RDW: 13.7 % (ref 11.5–14.6)

## 2013-02-18 NOTE — Patient Instructions (Addendum)
Please keep a diary of symptoms and signs. Enter significant symptoms, frequency, severity ( 1-10 scale), possible triggers, and response to medication, exercise or other therapeutic options as discussed. Please complete and return stool cards because of abdominal tenderness & pain.

## 2013-02-18 NOTE — Progress Notes (Signed)
Subjective:    Patient ID: Rebekah Kennedy, female    DOB: 10-17-1955, 57 y.o.   MRN: [id number]  HPI   Symptoms began 3-4 weeks ago without trigger or injury while she was driving. She experienced excruciating, sharp pain in the left upper thigh below the inguinal crease which was intermittent, lasting seconds.  Last week she developed similar symptoms in the right superior thigh slightly more distally & a similar pain in the left upper quadrant as well. She is not taking any medication for this.  There are no exacerbating or relieving factors for these brief but recurrent episodes of pain. They even occur while supine.  The symptoms become more frequent, 2-3 X /days.  No PMH of spinal issues. She is on Plavix because of a history of Brain Stem Stroke. She is followed by Rebekah Kennedy for diabetes.    Review of Systems  She denies associated fever, chills, sweats, or unexplained weight loss  She has no weakness, numbness, or tingling in her limbs.  There's been no change in color or temperature of the skin or any rash in areas of pain  She also denies any incontinence of urine or stool.  She's had no change in her bowels such as constipation, diarrhea, melena, rectal bleeding  She also denies dysuria, pyuria, or hematuria.     Objective:   Physical Exam Gen.: Adequately nourished in appearance. Central weight excess. Alert, appropriate and cooperative throughout exam.  Head: Normocephalic without obvious abnormalities  Eyes: No corneal or conjunctival inflammation noted. Pupils equal round reactive to light and accommodation.  Extraocular motion intact.  Neck: No deformities, masses, or tenderness noted. Range of motion normal. Lungs: Normal respiratory effort; chest expands symmetrically. Scattered low grade rhonchi supine; no increased work of breathing. Heart: Normal rate and rhythm. Normal S1 and S2. No gallop, click, or rub. No  murmur. Abdomen: Protuberant.Bowel sounds  normal; abdomen soft but slightly tender LUQ & LLQ. No masses, organomegaly or hernias noted.                                Musculoskeletal/extremities: No deformity or scoliosis noted of  the thoracic or lumbar spine.  No clubbing, cyanosis, edema, or significant extremity  deformity noted. Range of motion normal .Tone & strength  Normal. Joints normal . Nail health good. Able to lie down & sit up w/o help. Negative SLR bilaterally Vascular: Carotid, radial artery, dorsalis pedis and  posterior tibial pulses are full and equal. No bruits present. Neurologic: Alert and oriented x3. Deep tendon reflexes symmetrical and normal.  Gait normal  including heel & toe walking .        Skin: Intact without suspicious lesions or rashes. Lymph: No cervical, axillary lymphadenopathy present. Psych: Mood and affect are normal. Normally interactive                                                                                        Assessment & Plan:  #1 intermittent, brief sharp pain in both upper thighs and left upper quadrant. Please have any significant spinal etiology  in view of the transitory nature of her symptoms.  #2 past history of Brain Stem Stroke ; on Plavix long-term  #3 abdominal tenderness to palpation   #4 Diabetes; I would doubt diabetic neuropathic pain

## 2013-02-20 ENCOUNTER — Encounter: Payer: Self-pay | Admitting: *Deleted

## 2013-03-02 ENCOUNTER — Ambulatory Visit (INDEPENDENT_AMBULATORY_CARE_PROVIDER_SITE_OTHER): Payer: BC Managed Care – PPO | Admitting: Gynecology

## 2013-03-02 ENCOUNTER — Encounter: Payer: Self-pay | Admitting: Gynecology

## 2013-03-02 VITALS — BP 146/92 | Ht 65.0 in | Wt 233.0 lb

## 2013-03-02 DIAGNOSIS — Z01419 Encounter for gynecological examination (general) (routine) without abnormal findings: Secondary | ICD-10-CM

## 2013-03-02 DIAGNOSIS — D251 Intramural leiomyoma of uterus: Secondary | ICD-10-CM

## 2013-03-02 NOTE — Progress Notes (Signed)
Rebekah Kennedy 08/09/1955 [id number]        57 y.o.  G0P0 for annual exam.  Several issues noted below.  Past medical history,surgical history, medications, allergies, family history and social history were all reviewed and documented in the EPIC chart.  ROS:  Performed and pertinent positives and negatives are included in the history, assessment and plan .  Exam: Biomedical scientist Filed Vitals:   03/02/13 1557  BP: 146/92  Height: 5\' 5"  (1.651 m)  Weight: 233 lb (105.688 kg)   General appearance  Normal Skin grossly normal Head/Neck normal with no cervical or supraclavicular adenopathy thyroid normal Lungs  clear Cardiac RR, without RMG Abdominal  soft, nontender, without masses, organomegaly or hernia Breasts  examined lying and sitting without masses, retractions, discharge or axillary adenopathy. Pelvic  Ext/BUS/vagina  normal  Cervix  normal  Uterus  grossly, normal size, midline and mobile nontender   Adnexa  Without masses or tenderness    Anus and perineum  normal   Rectovaginal  normal sphincter tone without palpated masses or tenderness.    Assessment/Plan:  57 y.o. G0P0 female for annual exam.   1. Left groin pain x3 weeks. Patient notes in her left groin pain in area of hamstring attachment. Comes and goes. Some radiation up into the abdomen to her left upper quadrant. No nausea vomiting diarrhea constipation or urinary symptoms. Saw Dr. Alwyn Ren and is being evaluated for this. Her exam today is normal. Do not think it is due to her leiomyoma given their small size and her uterus feeling grossly normal today on exam. Dr. Eda Paschal noted ten-week size on exam last year but on followup ultrasound it was measuring overall normal in size. Right and left ovaries were normal last year and endometrial echo 2.4 mm.  Option is to repeat the ultrasound now discussed and offered. Again though her exam is benign without tenderness or masses. At this point the patient is going to  monitor and followup with Dr. Alwyn Ren. If it continues I suspect they will pursue a GI evaluation possible CT scan. 2. Mammography 09/2012. Patient will continue with annual mammography. SBE monthly review. 3. DEXA 2011 normal. Recommend repeat at age 86. Increase calcium and vitamin D reviewed. 4. Colonoscopy 2009. Repeat at their recommended interval. 5. Pap smear 2012. No Pap smear done today. History of cervical dysplasia with cryosurgery prior to 1990. Her Pap smears have been normal here per Dr. Verl Dicker note. And repeat Pap smear next year 3 year interval. 6. Health maintenance. Blood pressure 146-92 but patient admits to not taking her blood pressure medicine today. No blood work done as it is all done through her other doctor's offices. Followup one year, sooner as needed. Will check urinalysis today given her pain history.  Note: This document was prepared with digital dictation and possible smart phrase technology. Any transcriptional errors that result from this process are unintentional.   Dara Lords MD, 4:25 PM 03/02/2013

## 2013-03-02 NOTE — Patient Instructions (Signed)
Followup in one year for annual exam, sooner as needed. 

## 2013-03-03 LAB — URINALYSIS W MICROSCOPIC + REFLEX CULTURE
Bacteria, UA: NONE SEEN
Casts: NONE SEEN
Crystals: NONE SEEN
Glucose, UA: 500 mg/dL — AB
Hgb urine dipstick: NEGATIVE
Ketones, ur: NEGATIVE mg/dL
Urobilinogen, UA: 1 mg/dL (ref 0.0–1.0)
pH: 6 (ref 5.0–8.0)

## 2013-03-10 ENCOUNTER — Ambulatory Visit: Payer: BC Managed Care – PPO | Admitting: Neurology

## 2013-03-24 ENCOUNTER — Ambulatory Visit (INDEPENDENT_AMBULATORY_CARE_PROVIDER_SITE_OTHER): Payer: BC Managed Care – PPO | Admitting: Neurology

## 2013-03-24 ENCOUNTER — Encounter: Payer: Self-pay | Admitting: Neurology

## 2013-03-24 VITALS — BP 118/70 | HR 98 | Temp 98.2°F | Resp 24 | Ht 66.0 in | Wt 235.3 lb

## 2013-03-24 DIAGNOSIS — G463 Brain stem stroke syndrome: Secondary | ICD-10-CM

## 2013-03-24 DIAGNOSIS — R209 Unspecified disturbances of skin sensation: Secondary | ICD-10-CM

## 2013-03-24 DIAGNOSIS — I6789 Other cerebrovascular disease: Secondary | ICD-10-CM

## 2013-03-24 MED ORDER — CLOPIDOGREL BISULFATE 75 MG PO TABS: 75.0000 mg | ORAL_TABLET | Freq: Every day | ORAL | Status: DC

## 2013-03-24 MED ORDER — ATORVASTATIN CALCIUM 40 MG PO TABS: ORAL_TABLET | ORAL | Status: DC

## 2013-03-24 NOTE — Patient Instructions (Addendum)
1.  Check blood work today 2.  Continue lipitor 40mg  3.  Continue plavix 75mg  daily 4.  Return to clinic 93-months

## 2013-03-24 NOTE — Progress Notes (Signed)
[facility] Neurology [facility] Clinic Note - Initial Visit   Date: 03/24/2013    AITANNA Kennedy MRN: [id number] DOB: 1955-12-24   Dear Dr Alwyn Ren:  Thank you for your kind referral of Rebekah Kennedy for consultation of sensory disturbance of her skin. Although her history is well known to you, please allow [place] to reiterate it for the purpose of our medical record. The patient was accompanied to the clinic by self.   History of Present Illness: Rebekah Kennedy is a 57 y.o. year-old right-handed African American female with history of uncontrolled diabetes mellitus (dx 2008, HbA1c 11.8), hyperlipidemia, hypertension, depression, and R pontine stroke (2012, mild left residual weakness) presenting for evaluation of sensory disturbance of her legs.  About early September 2014, she notice stabbing pain over the left medial upper thigh, lasting 30 seconds.  It spontaneously resolved. A week later, she had the same symptoms over the right leg and abdomen.  It started occuring about once per day, but now occuring 1-3 x per day.  The areas are localized to a quarter size area over the left inner upper thigh/groin, right anterior thigh, and bilateral lower anterior chest.  There is no radiation or shooting quality to her symptoms. She denies any triggers.  No exacerbating or alleviating factors.  Denies any weakness or numbness/tingling of the feet.  No saddle anesthesia or incontinence.  She was last seen by Dr. Modesto Charon in January 2013 for hospital discharge followup of right pontine stroke.  Out-side paper records, electronic medical record, and images have been reviewed where available and summarized as:  Component     Latest Ref Rng 07/14/2012 10/14/2012  Hemoglobin A1C     4.6 - 6.5 % 11.8 (H) 10.1 (H)   Component     Latest Ref Rng 02/14/2011  Cholesterol     0 - 200 mg/dL 409  Triglycerides     0.0 - 149.0 mg/dL 81.1  HDL     >91.47 mg/dL 82.95  VLDL     0.0 - 62.1 mg/dL 30.8  LDL  (calc)     0 - 99 mg/dL 657 (H)  Total CHOL/HDL Ratio      4   MRI brain 04/2011: 1. Acute lacunar type infarcts in the right mid brain and pons. No mass effect or hemorrhage.  2. Superimposed chronic lacunar infarct in the right paracentral pons. These findings indicate acute on chronic small vessel ischemia.  3. Otherwise mild for age nonspecific cerebral white matter signal changes.  4. Intracranial MRA findings are below.  MRA brain 04/2011: 1. 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 No major branch occlusion. There is irregularity suggesting atherosclerosis in the right proximal PCA.  2. Mild anterior circulation atherosclerosis. No stenosis or major branch occlusion.   Past Medical History  Diagnosis Date  . DIABETES MELLITUS, TYPE II 12/01/2006  . HYPERLIPIDEMIA 12/01/2006  . HYPERTENSION 07/07/2006  . DEPRESSION 03/16/2007  . CHEST PAIN 01/23/2007  . ASTHMA NOS W/ACUTE EXACERBATION 07/10/2010  . Obesity   . Splenomegaly     in college  . Dysmenorrhea   . Fibroid   . TIA (transient ischemic attack) 2012  . 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 Cervical dysplasia     Past Surgical History  Procedure Laterality Date  . Appendectomy    . Myomectomy    . Ovarian cyst removal    . 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 Breast surgery  Reduction  . Pelvic laparoscopy  75,88    DL lysis of adhesions  . Dilation and curettage of uterus  1975    DUB  . Biopsy thyroid  05/02/11    Nonneoplastic goiter  . Gynecologic cryosurgery    . Colposcopy       Medications:  Current Outpatient Prescriptions on File Prior to Visit  Medication Sig Dispense Refill  . albuterol (VENTOLIN HFA) 108 (90 BASE) MCG/ACT inhaler Inhale 2 puffs into the lungs every 4 (four) hours as needed for wheezing. 1-2 puffs every 4 hours as needed for SOB  18 g  2  . amLODipine (NORVASC) 10 MG tablet TAKE ONE TABLET BY MOUTH ONE TIME DAILY  30  tablet  2  . budesonide-formoterol (SYMBICORT) 160-4.5 MCG/ACT inhaler Inhale 2 puffs into the lungs 2 (two) times daily.        Marland Kitchen desonide (DESOWEN) 0.05 % cream       . Insulin Glargine (LANTUS SOLOSTAR) 100 UNIT/ML SOPN Inject 300 Units into the skin every morning. And pen needles 2/day  35 pen  PRN  . Insulin Syringe-Needle U-100 (B-D INS SYR ULTRAFINE 1CC/31G) 31G X 5/16" 1 ML MISC 4 Devices by Other route daily.  120 each  11  . ketoconazole (NIZORAL) 2 % cream       . losartan (COZAAR) 100 MG tablet TAKE ONE TABLET BY MOUTH ONE TIME DAILY  30 tablet  5  . metoprolol succinate (TOPROL-XL) 100 MG 24 hr tablet Take one tablet by mouth one time daily  90 tablet  0  . montelukast (SINGULAIR) 10 MG tablet Take 1 tablet (10 mg total) by mouth at bedtime. 1 tablet by mouth once daily (necessary for asthma control)  30 tablet  11  . Multiple Vitamin (MULTIVITAMIN) tablet Take 1 tablet by mouth daily.        Marland Kitchen spironolactone (ALDACTONE) 25 MG tablet Take 1 tablet (25 mg total) by mouth 2 (two) times daily.  180 tablet  0  . terconazole (TERAZOL 3) 0.8 % vaginal cream Place 1 applicator vaginally at bedtime.  20 g  10   No current facility-administered medications on file prior to visit.    Allergies:  Allergies  Allergen Reactions  . Quinapril     cough    Family History: Family History  Problem Relation Age of Onset  . Cancer Maternal Grandmother     Colon Cancer  . Asthma Maternal Grandmother   . Diabetes Father   . Heart disease Father   . Hypertension Father   . Hypertension Mother   . Heart disease Mother   . Ovarian cancer Mother   . Cancer Mother     Lung cancer  . Asthma Mother   . COPD Mother   . Diabetes Brother   . Hypertension Brother   . Kidney disease Brother   . Asthma Brother   . Graves' disease Sister   . Graves' disease Paternal Grandmother   . Hypertension Paternal Grandmother   . Heart disease Paternal Grandmother   . Alzheimer's disease Paternal  Grandmother     Social History: History   Social History  . Marital Status: Single    Spouse Name: N/A    Number of Children: 2  . Years of Education: N/A   Occupational History  . TEACHER    Social History Main Topics  . Smoking status: Never Smoker   . Smokeless tobacco: Never Used  . Alcohol Use: Yes  Comment: very rare  . Drug Use: No  . Sexual Activity: Not Currently    Birth Control/ Protection: Post-menopausal   Other Topics Concern  . 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   EYES: No visual changes or eye pain ENT: No hearing changes.  No history of nose bleeds.   RESPIRATORY: No cough, wheezing and shortness of breath.   CARDIOVASCULAR: Negative for chest pain, and palpitations.   GI: Negative for abdominal discomfort, blood in stools or black stools.  No recent change in bowel habits.   GU:  No history of incontinence.   MUSCLOSKELETAL: No history of joint pain or swelling.  No myalgias.   SKIN: Negative for lesions, rash, and itching.   HEMATOLOGY/ONCOLOGY: Negative for prolonged bleeding, bruising easily, and swollen nodes.   ENDOCRINE: Negative for cold or heat intolerance, polydipsia or goiter.   PSYCH:  No depression or anxiety symptoms.   NEURO: As Above.   Vital Signs:  BP 118/70  Pulse 98  Temp(Src) 98.2 F (36.8 C) (Oral)  Resp 24  Ht 5\' 6"  (1.676 m)  Wt 235 lb 4.8 oz (106.731 kg)  BMI 38.00 kg/m2   Neurological Exam: MENTAL STATUS including orientation to time, place, person, recent and remote memory, attention span and concentration, language, and fund of knowledge is normal.  Speech is not dysarthric.  CRANIAL NERVES: II:  No visual field defects.  Unremarkable fundi.   III-IV-VI: Pupils equal round and reactive to light.  Normal conjugate,  extra-ocular eye movements in all directions of gaze.  No nystagmus.  No ptosis.   V:  Normal facial sensation.  VII:  Normal facial symmetry and movements. VIII:  Normal hearing and vestibular function.   IX-X:  Normal palatal movement.   XI:  Normal shoulder shrug and head rotation.   XII:  Normal tongue strength and range of motion, no deviation or fasciculation.  MOTOR:  No atrophy, fasciculations or abnormal movements.  No pronator drift.  Tone is normal.    Right Upper Extremity:    Left Upper Extremity:    Deltoid  5/5   Deltoid  5/5   Biceps  5/5   Biceps  5/5   Triceps  5/5   Triceps  5/5   Wrist extensors  5/5   Wrist extensors  5/5   Wrist flexors  5/5   Wrist flexors  5/5   Finger extensors  5/5   Finger extensors  5/5   Finger flexors  5/5   Finger flexors  5/5   Dorsal interossei  5/5   Dorsal interossei  5/5   Abductor pollicis  5/5   Abductor pollicis  5/5   Tone (Ashworth scale)  0  Tone (Ashworth scale)  0   Right Lower Extremity:    Left Lower Extremity:    Hip flexors  5/5   Hip flexors  5/5   Hip extensors  5/5   Hip extensors  5/5   Knee flexors  5/5   Knee flexors  5/5   Knee extensors  5/5   Knee extensors  5/5   Dorsiflexors  5/5   Dorsiflexors  5/5   Plantarflexors  5/5   Plantarflexors  5/5   Toe extensors  5/5   Toe  extensors  5/5   Toe flexors  5/5   Toe flexors  5/5   Tone (Ashworth scale)  0  Tone (Ashworth scale)  0+   MSRs:  Right                                                                 Left brachioradialis 2+  brachioradialis 2+  biceps 2+  biceps 2+  triceps 2+  triceps 2+  patellar 1+  patellar 1+  ankle jerk 0  ankle jerk 0  Hoffman no  Hoffman no  plantar response down  plantar response down   SENSORY:  Normal and symmetric perception of light touch, pinprick, vibration, and proprioception over the extremities.  There is no sensory level over the thoracic region.  Romberg's sign absent.   COORDINATION/GAIT: Normal finger-to-  nose-finger and heel-to-shin.  Intact rapid alternating movements bilaterally.  Able to rise from a chair without using arms.  Gait narrow based and stable. Marland Kitchen    IMPRESSION: Ms. Brymer is a 57 year old female with history of right pontine stroke (2012, mild left hemiparesis) presenting with atypical sensory symptoms. Her neurological examination is entirely normal and nonfocal. The paroxysmal stabbing sensation over her thorax and thighs does not conform to a cutaneous nerve or nerve root distribution. His symptoms are not consistent with diabetic amyotrophy, peripheral neuropathy, or radiculopathy.  I do not think an EMG would be helpful given the intermittent and proximal (groin and thoracic region) nature of her symptoms.  I will screen for routine neuropathy labs for completeness. I discussed that if symptoms are bothersome, a trial of neurontin can be offered.  She also has a history of right pontine stroke, likely small vessel (2012) and for secondary prevention was started on plavix 75mg  and lipitor 40mg , which she should continue.    PLAN/RECOMMENDATIONS:  1.  Check lipid profile, TSH, copper, ceruloplasmin 2.  Continue Plavix 75 and Lipitor 40mg  3.  Consider trial of neurontin going forward, if symptoms do not improve 4.  Encouraged tight glycemic control for secondary stroke risk factor prevention 5.  Return to clinic in 2 months   The duration of this appointment visit was 45 minutes of face-to-face time with the patient.  Greater than 50% of this time was spent in counseling, explanation of diagnosis, planning of further management, and coordination of care.   Thank you for allowing me to participate in patient's care.  If I can answer any additional questions, I would be pleased to do so.    Sincerely,    Miyu Fenderson K. Allena Katz, DO

## 2013-03-25 LAB — TSH: TSH: 0.68 u[IU]/mL (ref 0.35–5.50)

## 2013-03-25 LAB — LIPID PANEL
Cholesterol: 202 mg/dL — ABNORMAL HIGH (ref 0–200)
VLDL: 43.2 mg/dL — ABNORMAL HIGH (ref 0.0–40.0)

## 2013-03-25 LAB — CERULOPLASMIN: Ceruloplasmin: 34 mg/dL (ref 20–60)

## 2013-03-25 LAB — LDL CHOLESTEROL, DIRECT: Direct LDL: 126.7 mg/dL

## 2013-03-26 ENCOUNTER — Other Ambulatory Visit: Payer: Self-pay

## 2013-03-27 ENCOUNTER — Telehealth: Payer: Self-pay

## 2013-03-27 NOTE — Telephone Encounter (Signed)
Called pt and left voicemail relaying your message. 

## 2013-04-07 ENCOUNTER — Ambulatory Visit: Payer: BC Managed Care – PPO | Admitting: Endocrinology

## 2013-04-13 ENCOUNTER — Ambulatory Visit: Payer: BC Managed Care – PPO | Admitting: Endocrinology

## 2013-04-20 ENCOUNTER — Ambulatory Visit (INDEPENDENT_AMBULATORY_CARE_PROVIDER_SITE_OTHER): Payer: BC Managed Care – PPO | Admitting: Endocrinology

## 2013-04-20 ENCOUNTER — Encounter: Payer: Self-pay | Admitting: Endocrinology

## 2013-04-20 VITALS — BP 128/88 | HR 102 | Temp 98.2°F | Ht 66.0 in | Wt 239.6 lb

## 2013-04-20 DIAGNOSIS — E1039 Type 1 diabetes mellitus with other diabetic ophthalmic complication: Secondary | ICD-10-CM

## 2013-04-20 DIAGNOSIS — E119 Type 2 diabetes mellitus without complications: Secondary | ICD-10-CM

## 2013-04-20 NOTE — Patient Instructions (Addendum)
A diabetes blood test is requested for you today.  We'll contact you with results.    On this type of insulin schedule, you should eat meals on a regular schedule.  If a meal is missed or significantly delayed, your blood sugar could go low. check your blood sugar 2 times a day.  vary the time of day when you check, between before the 3 meals, and at bedtime.  also check if you have symptoms of your blood sugar being too high or too low.  please keep a record of the readings and bring it to your next appointment here.  please call us sooner if you are having low blood sugar episodes, or if it stays over 200.   Please come back for a follow-up appointment in 6 weeks.

## 2013-04-20 NOTE — Progress Notes (Signed)
Subjective:    Patient ID: Rebekah Kennedy, female    DOB: 1955-07-19, 57 y.o.   MRN: [id number]  HPI Pt returns for f/u of insulin-requiring DM (dx'ed 2004, when she presented with severe hyperglycemia; she has mild if any neuropathy of the lower extremities; she has associated complications of CVA, nephropathy, and retinopathy; she has done better with a simpler insulin regimen, but therapy has been limited by noncompliance with insulin injections and cbg monitoring; she has never had severe hypoglycemia or DKA).  Pt says she seldom checks cbg's.  It goes as high as 400, but she does not know details.  pt states she feels well in general.   Past Medical History  Diagnosis Date  . DIABETES MELLITUS, TYPE II 12/01/2006  . HYPERLIPIDEMIA 12/01/2006  . HYPERTENSION 07/07/2006  . DEPRESSION 03/16/2007  . CHEST PAIN 01/23/2007  . ASTHMA NOS W/ACUTE EXACERBATION 07/10/2010  . Obesity   . Splenomegaly     in college  . Dysmenorrhea   . Fibroid   . TIA (transient ischemic attack) 2012  . 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 Cervical dysplasia     Past Surgical History  Procedure Laterality Date  . Appendectomy    . Myomectomy    . Ovarian cyst removal    . 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 Breast surgery      Reduction  . Pelvic laparoscopy  75,88    DL lysis of adhesions  . Dilation and curettage of uterus  1975    DUB  . Biopsy thyroid  05/02/11    Nonneoplastic goiter  . Gynecologic cryosurgery    . Colposcopy      History   Social History  . Marital Status: Single    Spouse Name: N/A    Number of Children: 2  . Years of Education: N/A   Occupational History  . TEACHER    Social History Main Topics  . Smoking status: Never Smoker   . Smokeless tobacco: Never Used  . Alcohol Use: Yes     Comment: very rare  . Drug Use: No  . Sexual Activity: Not Currently    Birth Control/ Protection: Post-menopausal   Other Topics Concern  . 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 albuterol (VENTOLIN HFA) 108 (90 BASE) MCG/ACT inhaler Inhale 2 puffs into the lungs every 4 (four) hours as needed for wheezing. 1-2 puffs every 4 hours as needed for SOB  18 g  2  . amLODipine (NORVASC) 10 MG tablet TAKE ONE TABLET BY MOUTH ONE TIME DAILY  30 tablet  2  . atorvastatin (LIPITOR) 40 MG tablet TAKE  ONE TABLET BY MOUTH NIGHTLY AT BEDTIME  3090 tablet  3  . budesonide-formoterol (SYMBICORT) 160-4.5 MCG/ACT inhaler Inhale 2 puffs into the lungs 2 (two) times daily.        . clopidogrel (PLAVIX) 75 MG tablet Take 1 tablet (75 mg total) by mouth daily with breakfast.  90 tablet  3  . desonide (DESOWEN) 0.05 % cream       . Insulin Syringe-Needle U-100 (B-D INS SYR ULTRAFINE 1CC/31G) 31G X 5/16" 1 ML MISC 4 Devices by Other route daily.  120 each  11  . ketoconazole (NIZORAL) 2 % cream       . losartan (COZAAR) 100 MG tablet TAKE ONE TABLET BY MOUTH ONE TIME DAILY  30 tablet  5  . metoprolol succinate (TOPROL-XL) 100 MG 24 hr tablet Take one tablet by mouth one time daily  90 tablet  0  . montelukast (SINGULAIR) 10 MG tablet Take 1 tablet (10 mg total) by mouth at bedtime. 1 tablet by mouth once daily (necessary for asthma control)  30 tablet  11  . Multiple Vitamin (MULTIVITAMIN) tablet Take 1 tablet by mouth daily.        Marland Kitchen spironolactone (ALDACTONE) 25 MG tablet Take 1 tablet (25 mg total) by mouth 2 (two) times daily.  180 tablet  0  . terconazole (TERAZOL 3) 0.8 % vaginal cream Place 1 applicator vaginally at bedtime.  20 g  10   No current facility-administered medications on file prior to visit.    Allergies  Allergen Reactions  . Quinapril     cough    Family History  Problem Relation Age of Onset  . Cancer Maternal Grandmother     Colon Cancer  .  Asthma Maternal Grandmother   . Diabetes Father   . Heart disease Father   . Hypertension Father   . Hypertension Mother   . Heart disease Mother   . Ovarian cancer Mother   . Cancer Mother     Lung cancer  . Asthma Mother   . COPD Mother   . Diabetes Brother   . Hypertension Brother   . Kidney disease Brother   . Asthma Brother   . Graves' disease Sister   . Graves' disease Paternal Grandmother   . Hypertension Paternal Grandmother   . Heart disease Paternal Grandmother   . Alzheimer's disease Paternal Grandmother     BP 128/88  Pulse 102  Temp(Src) 98.2 F (36.8 C) (Oral)  Ht 5\' 6"  (1.676 m)  Wt 239 lb 9 oz (108.665 kg)  BMI 38.68 kg/m2  SpO2 97%  Review of Systems denies hypoglycemia.  She has gained weight.      Objective:   Physical Exam VITAL SIGNS:  See vs page GENERAL: no distress  Lab Results  Component Value Date   HGBA1C 8.4* 04/20/2013      Assessment & Plan:  DM: she needs increased rx.  This insulin regimen was chosen from multiple options, for its simplicity.  The benefits of glycemic control must be weighed against the risks of hypoglycemia.  Weight gain: this complicates the rx of DM.

## 2013-04-21 LAB — HEMOGLOBIN A1C: Hgb A1c MFr Bld: 8.4 % — ABNORMAL HIGH (ref 4.6–6.5)

## 2013-05-26 ENCOUNTER — Encounter: Payer: Self-pay | Admitting: Neurology

## 2013-05-26 ENCOUNTER — Telehealth: Payer: Self-pay | Admitting: Neurology

## 2013-05-26 ENCOUNTER — Ambulatory Visit: Payer: BC Managed Care – PPO | Admitting: Neurology

## 2013-05-26 NOTE — Telephone Encounter (Signed)
Pt no showed today's follow up appt w/ Dr. Posey Pronto. I have mailed a no show letter to the patient / Sherri S.

## 2013-06-01 ENCOUNTER — Ambulatory Visit: Payer: BC Managed Care – PPO | Admitting: Endocrinology

## 2013-06-16 ENCOUNTER — Telehealth: Payer: Self-pay | Admitting: *Deleted

## 2013-06-16 ENCOUNTER — Ambulatory Visit (INDEPENDENT_AMBULATORY_CARE_PROVIDER_SITE_OTHER): Payer: BC Managed Care – PPO | Admitting: Family Medicine

## 2013-06-16 VITALS — BP 152/88 | HR 85 | Temp 98.2°F | Resp 16 | Ht 66.0 in | Wt 231.6 lb

## 2013-06-16 DIAGNOSIS — I10 Essential (primary) hypertension: Secondary | ICD-10-CM

## 2013-06-16 DIAGNOSIS — IMO0001 Reserved for inherently not codable concepts without codable children: Secondary | ICD-10-CM

## 2013-06-16 DIAGNOSIS — K5289 Other specified noninfective gastroenteritis and colitis: Secondary | ICD-10-CM

## 2013-06-16 DIAGNOSIS — G8929 Other chronic pain: Secondary | ICD-10-CM

## 2013-06-16 DIAGNOSIS — R1110 Vomiting, unspecified: Secondary | ICD-10-CM

## 2013-06-16 DIAGNOSIS — K529 Noninfective gastroenteritis and colitis, unspecified: Secondary | ICD-10-CM

## 2013-06-16 DIAGNOSIS — R1013 Epigastric pain: Secondary | ICD-10-CM

## 2013-06-16 DIAGNOSIS — E1165 Type 2 diabetes mellitus with hyperglycemia: Secondary | ICD-10-CM

## 2013-06-16 DIAGNOSIS — E119 Type 2 diabetes mellitus without complications: Secondary | ICD-10-CM

## 2013-06-16 DIAGNOSIS — R197 Diarrhea, unspecified: Secondary | ICD-10-CM

## 2013-06-16 LAB — POCT CBC
Granulocyte percent: 74 %G (ref 37–80)
HCT, POC: 48.5 % — AB (ref 37.7–47.9)
Hemoglobin: 15.1 g/dL (ref 12.2–16.2)
LYMPH, POC: 2.5 (ref 0.6–3.4)
MCH, POC: 27 pg (ref 27–31.2)
MCHC: 31.1 g/dL — AB (ref 31.8–35.4)
MCV: 86.8 fL (ref 80–97)
MID (CBC): 0.8 (ref 0–0.9)
MPV: 9.9 fL (ref 0–99.8)
PLATELET COUNT, POC: 373 10*3/uL (ref 142–424)
POC GRANULOCYTE: 9.3 — AB (ref 2–6.9)
POC LYMPH %: 20 % (ref 10–50)
POC MID %: 6 % (ref 0–12)
RBC: 5.59 M/uL — AB (ref 4.04–5.48)
RDW, POC: 14.5 %
WBC: 12.6 10*3/uL — AB (ref 4.6–10.2)

## 2013-06-16 LAB — POCT UA - MICROSCOPIC ONLY
CRYSTALS, UR, HPF, POC: NEGATIVE
Casts, Ur, LPF, POC: NEGATIVE
MUCUS UA: NEGATIVE
YEAST UA: NEGATIVE

## 2013-06-16 LAB — COMPREHENSIVE METABOLIC PANEL
ALK PHOS: 85 U/L (ref 39–117)
ALT: 16 U/L (ref 0–35)
AST: 11 U/L (ref 0–37)
Albumin: 4.6 g/dL (ref 3.5–5.2)
BUN: 28 mg/dL — ABNORMAL HIGH (ref 6–23)
CO2: 29 mEq/L (ref 19–32)
Calcium: 10.2 mg/dL (ref 8.4–10.5)
Chloride: 98 mEq/L (ref 96–112)
Creat: 1.34 mg/dL — ABNORMAL HIGH (ref 0.50–1.10)
Glucose, Bld: 312 mg/dL — ABNORMAL HIGH (ref 70–99)
Potassium: 3.9 mEq/L (ref 3.5–5.3)
SODIUM: 141 meq/L (ref 135–145)
TOTAL PROTEIN: 8.1 g/dL (ref 6.0–8.3)
Total Bilirubin: 0.6 mg/dL (ref 0.3–1.2)

## 2013-06-16 LAB — POCT URINALYSIS DIPSTICK
Bilirubin, UA: NEGATIVE
Glucose, UA: 250
KETONES UA: NEGATIVE
Leukocytes, UA: NEGATIVE
NITRITE UA: NEGATIVE
PH UA: 5
Protein, UA: 100
Spec Grav, UA: 1.015
Urobilinogen, UA: 0.2

## 2013-06-16 LAB — GLUCOSE, POCT (MANUAL RESULT ENTRY): POC Glucose: 303 mg/dl — AB (ref 70–99)

## 2013-06-16 MED ORDER — ONDANSETRON 4 MG PO TBDP: ORAL_TABLET | ORAL | Status: DC

## 2013-06-16 MED ORDER — ONDANSETRON 4 MG PO TBDP
4.0000 mg | ORAL_TABLET | Freq: Once | ORAL | Status: AC
  Administered 2013-06-16: 4 mg via ORAL

## 2013-06-16 NOTE — Patient Instructions (Signed)
Drink plenty of fluids  Take the Zofran every 4-6 hours as needed for nausea  If the diarrhea resumes, take an Imodium( over the counter)  Go ahead and take about 200 units of insulin rather than usual 350 and it does today. Monitor your fingerstick sugar several times a day for the next couple of days.  If with good hydration you are still feeling worse in the next day or 2 get rechecked  If you are unable to keep stuff down you may need to get some IV fluids, so either return here or go to the emergency room in the middle of the night or other times if needed.  If worse pain or any more fever also get rechecked

## 2013-06-16 NOTE — Telephone Encounter (Signed)
Patient called and stated that she has had vomiting and diarrhea since Saturday. She denies fever, chills, body aches. Patient was advised that all of our appointments were booked and she stated she could go to [facility] on [address]. JG//CMA

## 2013-06-16 NOTE — Progress Notes (Signed)
Subjective: 58 year old lady who has a history of diabetes. She started getting sick Sunday with vomiting and diarrhea. She had abdominal pains with this. She felt better Monday and went to work, but then got worse again. She had more vomiting and diarrhea, with a lot of vomiting yesterday. She vomited once this morning. She just feels lousy. She's not having much in the way of pain now. The diarrhea seems to have subsided.  Objective: Somewhat ill-appearing lady, very worn out and keeps closing her eyes and almost dozing off. She really didn't feel like sitting up for her to stay lying down. Her TMs are normal. Throat clear. Mucous membranes look moist. Neck supple without significant nodes. Chest is clear to auscultation. Heart regular without murmurs. Abdomen had bowel sounds present. Soft without organomegaly masses. Mild nonspecific tenderness. Extremities normal.  Assessment: Nausea vomiting and diarrhea Abdominal pains Diabetes  Plan: Check her sugar and CBC and urine.   Results for orders placed in visit on 06/16/13  POCT CBC      Result Value Range   WBC 12.6 (*) 4.6 - 10.2 K/uL   Lymph, poc 2.5  0.6 - 3.4   POC LYMPH PERCENT 20.0  10 - 50 %L   MID (cbc) 0.8  0 - 0.9   POC MID % 6.0  0 - 12 %M   POC Granulocyte 9.3 (*) 2 - 6.9   Granulocyte percent 74.0  37 - 80 %G   RBC 5.59 (*) 4.04 - 5.48 M/uL   Hemoglobin 15.1  12.2 - 16.2 g/dL   HCT, POC 48.5 (*) 37.7 - 47.9 %   MCV 86.8  80 - 97 fL   MCH, POC 27.0  27 - 31.2 pg   MCHC 31.1 (*) 31.8 - 35.4 g/dL   RDW, POC 14.5     Platelet Count, POC 373  142 - 424 K/uL   MPV 9.9  0 - 99.8 fL  GLUCOSE, POCT (MANUAL RESULT ENTRY)      Result Value Range   POC Glucose 303 (*) 70 - 99 mg/dl  POCT UA - MICROSCOPIC ONLY      Result Value Range   WBC, Ur, HPF, POC 0-1     RBC, urine, microscopic 0-1     Bacteria, U Microscopic trace     Mucus, UA negative     Epithelial cells, urine per micros 2-6     Crystals, Ur, HPF, POC  negative     Casts, Ur, LPF, POC negative     Yeast, UA negative    POCT URINALYSIS DIPSTICK      Result Value Range   Color, UA yellow     Clarity, UA clear     Glucose, UA 250     Bilirubin, UA negative     Ketones, UA negative     Spec Grav, UA 1.015     Blood, UA trace-lysed     pH, UA 5.0     Protein, UA 100     Urobilinogen, UA 0.2     Nitrite, UA negative     Leukocytes, UA Negative     Assessment: Probable viral gastroenteritis with nausea, vomiting, diarrhea Mild leukocytosis, nonspecific Probable mild dehydration Poorly controlled diabetes  Plan: She has not taken her insulin this morning because she was sick. She will take 200 units red and her regular 350. She is to push fluids. Take Zofran. Imodium if necessary. Monitor her sugars. Return if worse.

## 2013-07-06 ENCOUNTER — Other Ambulatory Visit: Payer: Self-pay | Admitting: Internal Medicine

## 2013-07-08 NOTE — Telephone Encounter (Signed)
Rx sent to the pharmacy by e-script.  Pt is due complete physical.//AB/CMA

## 2013-08-20 ENCOUNTER — Ambulatory Visit (INDEPENDENT_AMBULATORY_CARE_PROVIDER_SITE_OTHER): Payer: BC Managed Care – PPO | Admitting: Internal Medicine

## 2013-08-20 ENCOUNTER — Encounter: Payer: Self-pay | Admitting: Internal Medicine

## 2013-08-20 VITALS — BP 150/90 | HR 110 | Temp 98.2°F | Resp 14 | Wt 239.0 lb

## 2013-08-20 DIAGNOSIS — M5414 Radiculopathy, thoracic region: Secondary | ICD-10-CM

## 2013-08-20 DIAGNOSIS — IMO0002 Reserved for concepts with insufficient information to code with codable children: Secondary | ICD-10-CM

## 2013-08-20 DIAGNOSIS — E1149 Type 2 diabetes mellitus with other diabetic neurological complication: Secondary | ICD-10-CM

## 2013-08-20 MED ORDER — GABAPENTIN 100 MG PO CAPS: ORAL_CAPSULE | ORAL | Status: DC

## 2013-08-20 NOTE — Progress Notes (Signed)
Subjective:    Patient ID: Rebekah Kennedy, female    DOB: [birth year], 58 y.o.   MRN: [id number]  HPI   Symptoms began 4-5 months ago as discomfort in the left thigh.  She was seen 02/18/13 for this & subsequently was evaluated by neurology. She was diagnosed as having upper thigh pain associated with left upper quadrant discomfort of unknown etiology  The discomfort in the thighs has essentially resolved except for minor isolated episodes;but she is now having sharp, intensive pain in the upper outer quadrants of abdomen with radiation to both flanks and back lasting seconds. It occurs 5-10 X/ day  . 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  This is associated with low energy and increased somnolence occurring daily.  Her Glargine insulin dose is 350 units  daily; her diabetes remains uncontrolled. Her most recent A-1 C was 8.4% 04/20/13.     Review of Systems  She denies significant  dyspepsia, dysphagia, unexplained weight loss,  melena, rectal bleeding, or small caliber stools. Change in stools such as diarrhea or constipation denied. Dysuria, pyuria, hematuria or flank pain are absent. No back pain with radiation anteriorly is appreciated. No associated change in color or temperature of the skin in area of symptoms.No associated rash or skin lesions present. Abnormal bruising or bleeding not present. She has had some numbness and tingling and cramps in the right leg. She has intermittent and chest fullness and wheezing She has had occasional fevers, chills, and low-grade fever. Tylenol has been of minimal benefit for headache which appear after work.     Objective:   Physical Exam General appearance :Morbid obesity; adequately nourished w/o distress.  Eyes: No conjunctival inflammation or scleral icterus is present.Faint arcus  Oral exam: Dental hygiene is good; lips and gums are healthy appearing.There is no oropharyngeal erythema or exudate noted.    Heart:  Normal rate and regular rhythm. S1 and S2 normal without gallop, murmur, click, rub or other extra sounds     Lungs:Chest clear to auscultation; no wheezes, rhonchi,rales ,or rubs present.No increased work of breathing.   Abdomen:Massive; bowel sounds normal, soft and non-tender without masses, organomegaly or hernias noted.  No guarding or rebound . No tenderness over the flanks to percussion  Musculoskeletal: Able to lie flat and sit up without help. Negative straight leg raising bilaterally.   Skin:Warm & dry.  Intact without suspicious lesions or rashes ; no jaundice or tenting. Hyperpigmented area right abdomen & LLQ (related to insulin injections by hx)  Lymphatic: No lymphadenopathy is noted about the head, neck, axilla areas.   Her responses are somewhat delayed to questions. Her history is somewhat ill-defined and rambling with generalized positive review of systems.As she describes multi-system symptoms; there's almost a la belle indifference affect exhibited             Assessment & Plan:  #1 transient bilateral upper quadrant pain with flank and back radiation; history suggests T8 thoracic radiculopathy. The brief intermittent character and location suggests thoracic nerve impingement rather than diabetic neuropathy. Diabetic neuropathy is not suggested by the location of the pain  #2 morbid obesity. Risk and benefits of possible gastric bypass/lap band surgery discussed. She was asked to contact her insurance company to assessor eligibility for the surgery. Her prior history of TIAs may preclude this option. #3 Uncontrolled diabetes with profound insulin resistance. This issue most likely would be reversed by addressing her morbid obesity via number two  Plan: See orders

## 2013-08-20 NOTE — Patient Instructions (Addendum)
Minimal Blood Pressure Goal= AVERAGE < 140/90;  Ideal is an AVERAGE < 135/85. This AVERAGE should be calculated from @ least 5-7 BP readings taken @ different times of day on different days of week. You should not respond to isolated BP readings , but rather the AVERAGE for that week .Please bring your  blood pressure cuff to office visits to verify that it is reliable.It  can also be checked against the blood pressure device at the pharmacy. Finger or wrist cuffs are not dependable; an arm cuff is. Assess response to the gabapentin one every 8 hours as needed. If it is partially beneficial, it can be increased up to a total of 3 pills every 8 hours as needed. This increase of 1 pill each dose  should take place over 72 hours at least.  Please call your insurance company to determine guidelines concerning potential gastric bypass surgery to help treat or reverse the multiple health issues discussed.

## 2013-08-20 NOTE — Progress Notes (Signed)
Pre visit review using our clinic review tool, if applicable. No additional management support is needed unless otherwise documented below in the visit note. 

## 2013-08-25 ENCOUNTER — Ambulatory Visit (INDEPENDENT_AMBULATORY_CARE_PROVIDER_SITE_OTHER)
Admission: RE | Admit: 2013-08-25 | Discharge: 2013-08-25 | Disposition: A | Discharge status: Home / Self Care | Payer: BC Managed Care – PPO | Source: Ambulatory Visit | Attending: Internal Medicine | Admitting: Internal Medicine

## 2013-08-25 DIAGNOSIS — IMO0002 Reserved for concepts with insufficient information to code with codable children: Secondary | ICD-10-CM

## 2013-08-25 DIAGNOSIS — M5414 Radiculopathy, thoracic region: Secondary | ICD-10-CM

## 2013-08-28 ENCOUNTER — Other Ambulatory Visit: Payer: Self-pay | Admitting: Internal Medicine

## 2013-08-28 ENCOUNTER — Encounter: Payer: Self-pay | Admitting: Internal Medicine

## 2013-08-28 DIAGNOSIS — M5414 Radiculopathy, thoracic region: Secondary | ICD-10-CM

## 2013-09-07 ENCOUNTER — Telehealth: Payer: Self-pay

## 2013-09-07 NOTE — Telephone Encounter (Signed)
Relevant patient education assigned to patient using Emmi. ° °

## 2013-09-09 ENCOUNTER — Encounter: Payer: Self-pay | Admitting: Internal Medicine

## 2013-09-09 ENCOUNTER — Ambulatory Visit (INDEPENDENT_AMBULATORY_CARE_PROVIDER_SITE_OTHER): Payer: BC Managed Care – PPO | Admitting: Internal Medicine

## 2013-09-09 VITALS — BP 148/90 | HR 114 | Temp 98.3°F | Wt 241.4 lb

## 2013-09-09 DIAGNOSIS — S4980XA Other specified injuries of shoulder and upper arm, unspecified arm, initial encounter: Secondary | ICD-10-CM

## 2013-09-09 DIAGNOSIS — S46909A Unspecified injury of unspecified muscle, fascia and tendon at shoulder and upper arm level, unspecified arm, initial encounter: Secondary | ICD-10-CM

## 2013-09-09 DIAGNOSIS — S4991XA Unspecified injury of right shoulder and upper arm, initial encounter: Secondary | ICD-10-CM

## 2013-09-09 MED ORDER — TRAMADOL HCL 50 MG PO TABS: 50.0000 mg | ORAL_TABLET | Freq: Four times a day (QID) | ORAL | Status: DC | PRN

## 2013-09-09 NOTE — Patient Instructions (Signed)
Use an anti-inflammatory cream such as Aspercreme or Zostrix cream twice a day to the affected area as needed. In lieu of this warm moist compresses or  hot water bottle can be used. Do not apply ice . 

## 2013-09-09 NOTE — Progress Notes (Signed)
Pre visit review using our clinic review tool, if applicable. No additional management support is needed unless otherwise documented below in the visit note. 

## 2013-09-09 NOTE — Progress Notes (Signed)
   Subjective:    Patient ID: Rebekah Kennedy, female    DOB: Feb 11, 1956, 58 y.o.   MRN: [id number]  HPI  She fell onto her R shoulder on 08/11/13 while running to her car; she tripped over a metal cart onto gravel drive.  She denies cardiac or neural prodrome and did not lose consciousness.  Since that time, she continues to experience pain (8/10) with R arm movement and constant throbbing at night.  She has some relief with heat; none from Neurontin or Voltaren gel.  She was seen on 06/20/12 for pain at same joint; she had some relief at that time with Voltaren gel.   Review of Systems Denies fever, chills, sweats, change in weight.  Denies redness, swelling, joint stiffness, skin color or temperature changes.  Denies muscle cramps, weakness, numbness, tingling, enlarged lymph nodes, abnormal bruising or bleeding.      Objective:   Physical Exam Gen.: Morbidly obese in appearance. Alert, appropriate and cooperative throughout exam.  Head: Normocephalic without obvious abnormalities  Lungs: Normal respiratory effort; chest expands symmetrically. Lungs are clear to auscultation without rales, wheezes, or increased work of breathing.  Heart: Normal rate and rhythm. Normal S1 and S2. No gallop, click, or rub. No murmur.  Musculoskeletal/extremities: No deformity or scoliosis noted of the thoracic or lumbar spine.  R shoulder: Range of motion limited at R shoulder; >90 degrees abduction elicits pain; painful arc sign. Reduced grip strength on R.  Hand joints normal.Fingernail health good.  Vascular: Carotid, radial artery pulses are full and equal.  Neurologic: Alert and oriented x3. Decreased strength RUE. DTRs decreased @ knees Gait normal.  Skin: Intact without suspicious lesions or rashes.  Lymph: No cervical, axillary lymphadenopathy present.  Psych: Mood and affect are normal. Normally interactive.      Assessment & Plan:  #1 R shoulder pain, probable rotator cuff  tear/tendonitisSee orders

## 2013-09-09 NOTE — Progress Notes (Signed)
   Subjective:    Patient ID: Rebekah Kennedy, female    DOB: 01-13-1956, 58 y.o.   MRN: [id number]  HPI  She fell onto her R shoulder on  08/11/13 while running to her car; she tripped over a metal cart onto gravel drive.  She denies cardiac or neural prodrome and did not lose consciousness.  Since that time, she continues to experience pain (8/10) with R arm movement and constant throbbing at night.  She has some relief with heat; none from Neurontin or Voltaren gel.  She was seen on 06/20/12 for pain at same joint; she had some relief at that time with Voltaren gel.   Review of Systems Denies fever, chills, sweats, change in weight.  Denies redness, swelling, joint stiffness, skin color or temperature changes.  Denies muscle cramps, weakness, numbness, tingling, enlarged lymph nodes, abnormal bruising or bleeding.     Objective:   Physical Exam Gen.: Healthy and well-nourished in appearance. Alert, appropriate and cooperative throughout exam. Head: Normocephalic without obvious abnormalities; no alopecia  Eyes: No corneal or conjunctival inflammation noted. Vision grossly normal without lenses. Neck: No deformities, masses, or tenderness noted. Range of motion WNL.  Lungs: Normal respiratory effort; chest expands symmetrically. Lungs are clear to auscultation without rales, wheezes, or increased work of breathing. Heart: Normal rate and rhythm. Normal S1 and S2. No gallop, click, or rub. No murmur. Musculoskeletal/extremities: No deformity or scoliosis noted of  the thoracic or lumbar spine.  R shoulder: Range of motion limited at R shoulder; >90 degrees abduction elicits pain; painful arc sign. Reduced grip strength on R.  Hand joints normal.Fingernail / toenail health good. Vascular: Carotid, radial artery, dorsalis pedis and  posterior tibial pulses are full and equal. Neurologic: Alert and oriented x3.  Gait normal.    Skin: Intact without suspicious lesions or rashes. Lymph: No  cervical, axillary lymphadenopathy present. Psych: Mood and affect are normal. Normally interactive.                                                                                  Assessment & Plan:  #1 R shoulder pain, probable rotator cuff tear/tendonitis

## 2013-09-14 ENCOUNTER — Ambulatory Visit (INDEPENDENT_AMBULATORY_CARE_PROVIDER_SITE_OTHER): Payer: BC Managed Care – PPO | Admitting: Endocrinology

## 2013-09-14 ENCOUNTER — Other Ambulatory Visit: Payer: Self-pay

## 2013-09-14 ENCOUNTER — Encounter: Payer: Self-pay | Admitting: Endocrinology

## 2013-09-14 VITALS — BP 120/76 | HR 101 | Temp 98.4°F | Ht 66.0 in | Wt 240.0 lb

## 2013-09-14 DIAGNOSIS — E1065 Type 1 diabetes mellitus with hyperglycemia: Principal | ICD-10-CM

## 2013-09-14 DIAGNOSIS — E1039 Type 1 diabetes mellitus with other diabetic ophthalmic complication: Secondary | ICD-10-CM

## 2013-09-14 MED ORDER — INSULIN GLARGINE 100 UNIT/ML SOLOSTAR PEN: 350.0000 [IU] | PEN_INJECTOR | SUBCUTANEOUS | Status: DC

## 2013-09-14 NOTE — Progress Notes (Signed)
Subjective:    Patient ID: Rebekah Kennedy, female    DOB: 02/19/1956, 58 y.o.   MRN: [id number]  HPI Pt returns for f/u of insulin-requiring DM (dx'ed 2004, when she presented with severe hyperglycemia; she has mild if any neuropathy of the lower extremities; she has associated complications of CVA, nephropathy, and retinopathy; due to noncompliance with insulin injections and cbg monitoring, she is on a simple qd insulin regimen; she has never had severe hypoglycemia or DKA; she is pursuing weight-loss surgery).  She takes only 300 units qam.  no cbg record, but states cbg's vary from 140-400.  It is in general higher as the day goes on.   Past Medical History  Diagnosis Date  . DIABETES MELLITUS, TYPE II 12/01/2006  . HYPERLIPIDEMIA 12/01/2006  . HYPERTENSION 07/07/2006  . DEPRESSION 03/16/2007  . CHEST PAIN 01/23/2007  . ASTHMA NOS W/ACUTE EXACERBATION 07/10/2010  . Obesity   . Splenomegaly     in college  . Dysmenorrhea   . Fibroid   . TIA (transient ischemic attack) 2012  . 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 Cervical dysplasia   . Cataract     Past Surgical History  Procedure Laterality Date  . Appendectomy    . Myomectomy    . Ovarian cyst removal    . 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 Breast surgery      Reduction  . Pelvic laparoscopy  75,88    DL lysis of adhesions  . Dilation and curettage of uterus  1975    DUB  . Biopsy thyroid  05/02/11    Nonneoplastic goiter  . Gynecologic cryosurgery    . Colposcopy    . Eye surgery      History   Social History  . Marital Status: Single    Spouse Name: N/A    Number of Children: 2  . Years of Education: N/A   Occupational History  . TEACHER    Social History Main Topics  . Smoking status: Never Smoker   . Smokeless tobacco: Never Used  . Alcohol Use: No     Comment: very rare  . Drug Use: No  . Sexual Activity: Not Currently    Birth Control/ Protection: Post-menopausal   Other  Topics Concern  . 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 albuterol (VENTOLIN HFA) 108 (90 BASE) MCG/ACT inhaler Inhale 2 puffs into the lungs every 4 (four) hours as needed for wheezing. 1-2 puffs every 4 hours as needed for SOB  18 g  2  . amLODipine (NORVASC) 10 MG tablet TAKE ONE TABLET BY MOUTH ONE TIME DAILY  30 tablet  2  . atorvastatin (LIPITOR) 40 MG tablet TAKE  ONE TABLET BY MOUTH NIGHTLY AT BEDTIME  3090 tablet  3  . budesonide-formoterol (SYMBICORT) 160-4.5 MCG/ACT inhaler Inhale 2 puffs into the lungs 2 (two) times daily.        . clopidogrel (PLAVIX) 75 MG tablet Take 1 tablet (75 mg total) by mouth daily with breakfast.  90 tablet  3  . desonide (DESOWEN) 0.05 % cream       . gabapentin (NEURONTIN) 100 MG capsule One pill every eight hours as needed;  dose may be increased by one pill each dose after 72 hours if only partially effective  30 capsule  2  . Insulin Syringe-Needle U-100 (B-D INS SYR ULTRAFINE 1CC/31G) 31G X 5/16" 1 ML MISC 4 Devices by Other route daily.  120 each  11  . ketoconazole (NIZORAL) 2 % cream       . metoprolol succinate (TOPROL-XL) 100 MG 24 hr tablet PT IS DUE COMPLETE PHYSICAL.  TAKE 1 TABLET BY MOUTH ONE TIME DAILY.  90 tablet  0  . montelukast (SINGULAIR) 10 MG tablet Take 1 tablet (10 mg total) by mouth at bedtime. 1 tablet by mouth once daily (necessary for asthma control)  30 tablet  11  . Multiple Vitamin (MULTIVITAMIN) tablet Take 1 tablet by mouth daily.        . ondansetron (ZOFRAN ODT) 4 MG disintegrating tablet Take one pill every 4-6 hours as needed for nausea and vomiting  12 tablet  0  . terconazole (TERAZOL 3) 0.8 % vaginal cream Place 1 applicator vaginally at bedtime.  20 g  10  . traMADol (ULTRAM) 50 MG tablet Take 1 tablet (50 mg total) by mouth  every 6 (six) hours as needed.  30 tablet  0   No current facility-administered medications on file prior to visit.    Allergies  Allergen Reactions  . Quinapril     cough    Family History  Problem Relation Age of Onset  . Cancer Maternal Grandmother     Colon Cancer  . Asthma Maternal Grandmother   . Diabetes Father   . Heart disease Father   . Hypertension Father   . Hyperlipidemia Father   . Hypertension Mother   . Heart disease Mother   . Ovarian cancer Mother   . Cancer Mother     Lung cancer  . Asthma Mother   . COPD Mother   . Hyperlipidemia Mother   . Diabetes Brother   . Hypertension Brother   . Kidney disease Brother   . Asthma Brother   . Heart disease Brother   . Hyperlipidemia Brother   . Graves' disease Sister   . Diabetes Sister   . Graves' disease Paternal Grandmother   . Hypertension Paternal Grandmother   . Heart disease Paternal Grandmother   . Alzheimer's disease Paternal Grandmother   . Cancer Maternal Grandfather     BP 120/76  Pulse 101  Temp(Src) 98.4 F (36.9 C) (Oral)  Ht 5\' 6"  (1.676 m)  Wt 240 lb (108.863 kg)  BMI 38.76 kg/m2  SpO2 98%  Review of Systems She denies hypoglycemia and weight change.      Objective:   Physical Exam VITAL SIGNS:  See vs page GENERAL: no distress      Assessment & Plan:  DM: she needs increased rx.  This insulin regimen was chosen from multiple options, for its simplicity.  The benefits of glycemic control must be weighed against the risks of hypoglycemia.  Morbid obesity: this complicates the rx of DM.

## 2013-09-14 NOTE — Patient Instructions (Addendum)
A diabetes blood test is requested for you today.  We'll contact you with results.    On this type of insulin schedule, you should eat meals on a regular schedule.  If a meal is missed or significantly delayed, your blood sugar could go low. check your blood sugar 2 times a day.  vary the time of day when you check, between before the 3 meals, and at bedtime.  also check if you have symptoms of your blood sugar being too high or too low.  please keep a record of the readings and bring it to your next appointment here.  please call us sooner if you are having low blood sugar episodes, or if it stays over 200.   Please come back for a follow-up appointment in 3 months.  Please increase the insulin to 350 units each morning.   Please continue to pursue the weight-loss surgery.

## 2013-09-15 ENCOUNTER — Other Ambulatory Visit: Payer: Self-pay | Admitting: Internal Medicine

## 2013-09-16 ENCOUNTER — Other Ambulatory Visit (INDEPENDENT_AMBULATORY_CARE_PROVIDER_SITE_OTHER): Payer: BC Managed Care – PPO

## 2013-09-16 DIAGNOSIS — E1039 Type 1 diabetes mellitus with other diabetic ophthalmic complication: Secondary | ICD-10-CM

## 2013-09-16 DIAGNOSIS — E1065 Type 1 diabetes mellitus with hyperglycemia: Principal | ICD-10-CM

## 2013-09-16 LAB — MICROALBUMIN / CREATININE URINE RATIO
Creatinine,U: 100.7 mg/dL
MICROALB/CREAT RATIO: 14.4 mg/g (ref 0.0–30.0)
Microalb, Ur: 14.5 mg/dL — ABNORMAL HIGH (ref 0.0–1.9)

## 2013-09-16 LAB — HEMOGLOBIN A1C: Hgb A1c MFr Bld: 8.9 % — ABNORMAL HIGH (ref 4.6–6.5)

## 2013-09-21 ENCOUNTER — Encounter: Payer: Self-pay | Admitting: Family Medicine

## 2013-09-21 ENCOUNTER — Other Ambulatory Visit (INDEPENDENT_AMBULATORY_CARE_PROVIDER_SITE_OTHER): Payer: BC Managed Care – PPO

## 2013-09-21 ENCOUNTER — Ambulatory Visit (INDEPENDENT_AMBULATORY_CARE_PROVIDER_SITE_OTHER): Payer: BC Managed Care – PPO | Admitting: Family Medicine

## 2013-09-21 VITALS — BP 134/84 | HR 93 | Wt 243.0 lb

## 2013-09-21 DIAGNOSIS — M25519 Pain in unspecified shoulder: Secondary | ICD-10-CM

## 2013-09-21 DIAGNOSIS — M75110 Incomplete rotator cuff tear or rupture of unspecified shoulder, not specified as traumatic: Secondary | ICD-10-CM

## 2013-09-21 DIAGNOSIS — M25511 Pain in right shoulder: Secondary | ICD-10-CM

## 2013-09-21 DIAGNOSIS — M75111 Incomplete rotator cuff tear or rupture of right shoulder, not specified as traumatic: Secondary | ICD-10-CM | POA: Insufficient documentation

## 2013-09-21 MED ORDER — MELOXICAM 15 MG PO TABS: 15.0000 mg | ORAL_TABLET | Freq: Every day | ORAL | Status: DC

## 2013-09-21 MED ORDER — TRAMADOL HCL 50 MG PO TABS: 50.0000 mg | ORAL_TABLET | Freq: Every evening | ORAL | Status: DC | PRN

## 2013-09-21 NOTE — Progress Notes (Signed)
[facility] [address], [place] [address] Phone: [phone] Subjective:    I'm seeing this patient by the request  of:  Unice Cobble, MD   CC: Right shoulder injury  [id number] Rebekah Kennedy is a 58 y.o. female coming in with complaint of right shoulder injury 6 weeks ago. Patient's tripped when she was getting into her car and tried to catch or so for the right arm. Since that time she's had a dull aching pain that seems to be in the anterior aspect of her shoulder. Worse with certain movements such as reaching behind her back or over her head. Denies any radiation down her arm or any numbness denies any neck pain. Patient has tried over-the-counter medications with minimal benefit. Patient states that the pain is bad enough that wakes her up at night and presented 7/10 in severity. Describes it as a dull aching pain.     Past medical history, social, surgical and family history all reviewed in electronic medical record.   Review of Systems: No headache, visual changes, nausea, vomiting, diarrhea, constipation, dizziness, abdominal pain, skin rash, fevers, chills, night sweats, weight loss, swollen lymph nodes, body aches, joint swelling, muscle aches, chest pain, shortness of breath, mood changes.   Objective Blood pressure 134/84, pulse 93, weight 243 lb (110.224 kg), SpO2 97.00%.  General: No apparent distress alert and oriented x3 mood and affect normal, dressed appropriately.  HEENT: Pupils equal, extraocular movements intact  Respiratory: Patient's speak in full sentences and does not appear short of breath  Cardiovascular: No lower extremity edema, non tender, no erythema  Skin: Warm dry intact with no signs of infection or rash on extremities or on axial skeleton.  Abdomen: Soft nontender  Neuro: Cranial nerves II through XII are intact, neurovascularly intact in all extremities with 2+ DTRs and 2+ pulses.  Lymph: No  lymphadenopathy of posterior or anterior cervical chain or axillae bilaterally.  Gait normal with good balance and coordination.  MSK:  Non tender with full range of motion and good stability and symmetric strength and tone of  elbows, wrist, hip, knee and ankles bilaterally.  Shoulder: Right Inspection reveals no abnormalities, atrophy or asymmetry. Palpation reveals generalized tenderness. ROM is full in all planes. Rotator cuff strength 4/5 compared to 5 out of 5 on contralateral side. signs of impingement with positive Neer and Hawkin's tests, empty can sign. Speeds and Yergason's tests normal. No labral pathology noted with negative Obrien's, negative clunk and good stability. Normal scapular function observed. No painful arc and no drop arm sign. No apprehension sign Contralateral shoulder unremarkable.  MSK US performed [id number] This study was ordered, performed, and interpreted by Charlann Boxer D.O.  Shoulder:   Supraspinatus:  Patient does have a partial tear that is approximately 50% of the tendon. No retraction noted. Infraspinatus:  Appears normal on long and transverse views. 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 Teres Minor:  Appears normal on long and transverse views. AC joint:  Capsule undistended, no geyser sign. Glenohumeral Joint:  Appears normal without effusion. Glenoid Labrum:  Intact without visualized tears. Biceps Tendon:  Appears normal on long and transverse views, no fraying of tendon, tendon located in intertubercular groove, no subluxation with shoulder internal or external rotation. No increased power doppler signal.  Impression: Partial rotator cuff tear   Procedure: Real-time Ultrasound Guided Injection of right glenohumeral joint Device: GE  Logiq E  Ultrasound guided injection is preferred based studies that show increased duration, increased effect, greater  accuracy, decreased procedural pain, increased response rate with ultrasound guided versus blind injection.  Verbal informed consent obtained.  Time-out conducted.  Noted no overlying erythema, induration, or other signs of local infection.  Skin prepped in a sterile fashion.  Local anesthesia: Topical Ethyl chloride.  With sterile technique and under real time ultrasound guidance:  Joint visualized.  23g 1  inch needle inserted posterior approach. Pictures taken for needle placement. Patient did have injection of 2 cc of 1% lidocaine, 2 cc of 0.5% Marcaine, and 1.0 cc of Kenalog 40 mg/dL. Completed without difficulty  Pain immediately resolved suggesting accurate placement of the medication.  Advised to call if fevers/chills, erythema, induration, drainage, or persistent bleeding.  Images permanently stored and available for review in the ultrasound unit.  Impression: Technically successful ultrasound guided injection.     Impression and Recommendations:     This case required medical decision making of moderate complexity.

## 2013-09-21 NOTE — Patient Instructions (Addendum)
Very nice to meet you Exercises 3 times a week.  Ice 20 minutes 2 times a day.  Meloxicam daily for 10 days then as needed Tramadol at night if needed for pain.  Vitamin D 2000 IU daily Turmeric 500mg  twice daily Come back again in 3 weeks.  Marland Kitchen

## 2013-09-21 NOTE — Assessment & Plan Note (Signed)
Injected today.  Icing 20 minutes, meds per orders, HEP and will return in 3-4 weeks. At that time I would really ultrasound the area to make sure that she is healing. The patient is not doing very well we need to consider sending for further imaging secondary to patient's body habitus. Also the patient's tear does seem to be worse she would need surgery likely.

## 2013-10-05 ENCOUNTER — Encounter: Payer: Self-pay | Admitting: Gynecology

## 2013-10-05 ENCOUNTER — Other Ambulatory Visit (HOSPITAL_COMMUNITY)
Admission: RE | Admit: 2013-10-05 | Discharge: 2013-10-05 | Disposition: A | Discharge status: Home / Self Care | Payer: BC Managed Care – PPO | Source: Ambulatory Visit | Attending: Gynecology | Admitting: Gynecology

## 2013-10-05 ENCOUNTER — Ambulatory Visit (INDEPENDENT_AMBULATORY_CARE_PROVIDER_SITE_OTHER): Payer: BC Managed Care – PPO | Admitting: Gynecology

## 2013-10-05 VITALS — BP 124/78

## 2013-10-05 DIAGNOSIS — Z01419 Encounter for gynecological examination (general) (routine) without abnormal findings: Secondary | ICD-10-CM | POA: Insufficient documentation

## 2013-10-05 DIAGNOSIS — N950 Postmenopausal bleeding: Secondary | ICD-10-CM | POA: Insufficient documentation

## 2013-10-05 DIAGNOSIS — Z124 Encounter for screening for malignant neoplasm of cervix: Secondary | ICD-10-CM

## 2013-10-05 DIAGNOSIS — N882 Stricture and stenosis of cervix uteri: Secondary | ICD-10-CM

## 2013-10-05 DIAGNOSIS — N952 Postmenopausal atrophic vaginitis: Secondary | ICD-10-CM

## 2013-10-05 DIAGNOSIS — Z1151 Encounter for screening for human papillomavirus (HPV): Secondary | ICD-10-CM | POA: Insufficient documentation

## 2013-10-05 MED ORDER — NONFORMULARY OR COMPOUNDED ITEM
Status: DC
Start: 2013-10-05 — End: 2014-01-18

## 2013-10-05 NOTE — Patient Instructions (Signed)

## 2013-10-05 NOTE — Addendum Note (Signed)
Addended by: Nelva Nay on: 10/05/2013 04:14 PM   Modules accepted: Orders

## 2013-10-05 NOTE — Progress Notes (Signed)
   Patient is a 58 year old who presented to the office today stating that she has noted some vaginal spotting when she wipes for the past 5 days. The patient with no prior history of hormone replacement therapy. Patient not sexually active. Patient morbidly obese. Last Pap smear normal in 2014. Patient with multiple medical comorbidities please see history of present illness and patient's record.  Exam: Abdomen pendulous Pelvic: Bartholin urethra Skene glands within normal limits Vagina: Very friable on contact which began to bleed. Cervix: No lesions or discharge Uterus: Very limited bimanual exam due to patient's abdominal girth Adnexa same as above Rectal exam: Not done  Prior to pelvic exam a Pap smear was repeated. She was counseled for an endometrial biopsy. Due to patient's stenotic cervical os it required cervical dilatation after cleansing the cervix with Betadine solution 1% lidocaine was infiltrated at the 2, 4, 8, and 10:00 position for a total of 5 cc. Once her cervix was dilated a sterile Pipelle was introduced into the uterine cavity although with the probe and the Pipelle we only got approximate measurement of 6 cm not sure there may be a mass inside the uterus that may be encroaching making it difficult to fully sound the uterus. We did submit some tissue for histological evaluation.  Assessment/plan: Morbidly obese female with postmenopausal bleeding for past 5 days with severely atrophic vagina friable on contact which may be the source for her spotting. Nevertheless she will be started temporarily on vaginal estrogen each bedtime for 5 days then twice a week thereafter until we do her sonohysterogram. I have explained to her that we cannot rule out at this time whether she may have endometrial hyperplasia or endometrial carcinoma due to the fact that her uterus did not sound completely where she might have a small uterus will have to determine better once we do the ultrasound. A  Pap smear was done today as well.

## 2013-10-14 ENCOUNTER — Encounter: Payer: Self-pay | Admitting: Family Medicine

## 2013-10-14 ENCOUNTER — Ambulatory Visit (INDEPENDENT_AMBULATORY_CARE_PROVIDER_SITE_OTHER): Payer: BC Managed Care – PPO | Admitting: Family Medicine

## 2013-10-14 VITALS — BP 140/86 | HR 85 | Ht 66.0 in | Wt 239.0 lb

## 2013-10-14 DIAGNOSIS — M75111 Incomplete rotator cuff tear or rupture of right shoulder, not specified as traumatic: Secondary | ICD-10-CM

## 2013-10-14 DIAGNOSIS — M75110 Incomplete rotator cuff tear or rupture of unspecified shoulder, not specified as traumatic: Secondary | ICD-10-CM

## 2013-10-14 NOTE — Assessment & Plan Note (Signed)
Patient has been doing incredibly well with her exercise program. Patient was given phase II as well as a more difficult theraband. Patient showing proper technique. Patient to decrease the amount of medications she is taking a regular basis. Patient will continue icing. Patient will come back again in 4-6 weeks for further evaluation. At that time will likely ultrasound the area to make sure that she is fully healed.  Spent greater than 25 minutes with patient face-to-face and had greater than 50% of counseling including as described above in assessment and plan.

## 2013-10-14 NOTE — Progress Notes (Signed)
  [facility] [address] [place], [place] [address] Phone: [phone] Subjective:     CC: Right shoulder injury follow up  [id number] Rebekah Kennedy is a 58 y.o. female coming in for followup of her right shoulder injury. Patient was found to have a rotator cuff tear that was partial previously. Patient did have the injection was given home exercise program as well as anti-inflammatories and tramadol. Patient states she is approximately 85-90% better. Patient still has some mild discomfort when she lays on that shoulder but otherwise she is able to do all activities of daily living. Patient has been able to lift her arm above her head without any significant discomfort as well. Patient has noticed also that a lot of her strength is coming back. Patient denies any new symptoms. Patient is happy with the results so far.     Past medical history, social, surgical and family history all reviewed in electronic medical record.   Review of Systems: No headache, visual changes, nausea, vomiting, diarrhea, constipation, dizziness, abdominal pain, skin rash, fevers, chills, night sweats, weight loss, swollen lymph nodes, body aches, joint swelling, muscle aches, chest pain, shortness of breath, mood changes.   Objective Blood pressure 140/86, pulse 85, height 5\' 6"  (1.676 m), weight 239 lb (108.41 kg), SpO2 97.00%.  General: No apparent distress alert and oriented x3 mood and affect normal, dressed appropriately.  HEENT: Pupils equal, extraocular movements intact  Respiratory: Patient's speak in full sentences and does not appear short of breath  Cardiovascular: No lower extremity edema, non tender, no erythema  Skin: Warm dry intact with no signs of infection or rash on extremities or on axial skeleton.  Abdomen: Soft nontender  Neuro: Cranial nerves II through XII are intact, neurovascularly intact in all extremities with 2+ DTRs and 2+ pulses.  Lymph: No  lymphadenopathy of posterior or anterior cervical chain or axillae bilaterally.  Gait normal with good balance and coordination.  MSK:  Non tender with full range of motion and good stability and symmetric strength and tone of  elbows, wrist, hip, knee and ankles bilaterally.  Shoulder: Right Inspection reveals no abnormalities, atrophy or asymmetry. Palpation reveals generalized tenderness. ROM is full in all planes. Rotator cuff strength 4/5 compared to 5 out of 5 on contralateral side patient though has noticed some increased strength from prior exam. Mild signs of impingement with positive Neer and Hawkin's tests, empty can sign improved from previous exam. Speeds and Yergason's tests normal. No labral pathology noted with negative Obrien's, negative clunk and good stability. Normal scapular function observed. No painful arc and no drop arm sign. No apprehension sign Contralateral shoulder unremarkable.     Impression and Recommendations:     This case required medical decision making of moderate complexity.

## 2013-10-14 NOTE — Patient Instructions (Signed)
Very nice see you You are doing great Continue the exercises 3 times a week for another 6 weeks.  Try icing right before bed, I think this will be helpful.  Try meds only 3 times a week for next week then discontinue.  Come in 4-6 weeks.

## 2013-10-19 ENCOUNTER — Other Ambulatory Visit: Payer: Self-pay | Admitting: Endocrinology

## 2013-10-19 ENCOUNTER — Encounter: Payer: Self-pay | Admitting: Endocrinology

## 2013-10-20 ENCOUNTER — Other Ambulatory Visit: Payer: Self-pay | Admitting: Gynecology

## 2013-10-20 DIAGNOSIS — N950 Postmenopausal bleeding: Secondary | ICD-10-CM

## 2013-10-22 ENCOUNTER — Ambulatory Visit (INDEPENDENT_AMBULATORY_CARE_PROVIDER_SITE_OTHER): Payer: BC Managed Care – PPO | Admitting: Gynecology

## 2013-10-22 ENCOUNTER — Other Ambulatory Visit: Payer: Self-pay | Admitting: Gynecology

## 2013-10-22 ENCOUNTER — Ambulatory Visit (INDEPENDENT_AMBULATORY_CARE_PROVIDER_SITE_OTHER): Payer: BC Managed Care – PPO

## 2013-10-22 DIAGNOSIS — N950 Postmenopausal bleeding: Secondary | ICD-10-CM

## 2013-10-22 DIAGNOSIS — N83339 Acquired atrophy of ovary and fallopian tube, unspecified side: Secondary | ICD-10-CM

## 2013-10-22 DIAGNOSIS — N952 Postmenopausal atrophic vaginitis: Secondary | ICD-10-CM

## 2013-10-22 DIAGNOSIS — D259 Leiomyoma of uterus, unspecified: Secondary | ICD-10-CM

## 2013-10-22 NOTE — Progress Notes (Signed)
   The patient presented to the office today for a sonohysterogram as a result of episode of vaginal spotting when she wiped. Patient was seen on 10/05/2013 whereby a Pap smear and endometrial biopsy was obtained. Patient was noted to have severe vaginal atrophy and friable vaginal mucosa on examination at that time.  Pap smear negative  Pathology report: Diagnosis Endometrium, biopsy, uterus - DETACHED SCANT HIGHLY FRAGMENTED BENIGN ENDOMETRIAL TISSUE ADMIXED WITH ABUNDANT MUCIN. - NO ATYPIA OR MALIGNANCY IDENTIFIED.  Patient was counseled for sonohysterogram. Results as follows: Uterus measures 6.2 x 5.7 x 4.5 cm with endometrial stripe of 2.4 mm. 2 small fibroids the largest one measuring 14 x 17 mm with calcification was noted. Both right and left ovary were normal. After cleansing the cervix a Betadine solution a sterile catheter was introduced into the uterine cavity normal saline was instilled and no intracavitary defect was noted.  Assessment/plan: Patient is isolated spotting at time of wiping appears to be related to severe vaginal atrophy as noted during exam (very friable tissue). Patient with thickened endometrial stripe, no intracavitary defect on sonohysterogram, and normal Pap smear. Patient will continue with the vaginal estrogen cream twice a week and will follow, manual exam or when necessary.

## 2013-11-25 ENCOUNTER — Encounter: Payer: Self-pay | Admitting: Family Medicine

## 2013-11-25 ENCOUNTER — Other Ambulatory Visit (INDEPENDENT_AMBULATORY_CARE_PROVIDER_SITE_OTHER): Payer: BC Managed Care – PPO

## 2013-11-25 ENCOUNTER — Ambulatory Visit (INDEPENDENT_AMBULATORY_CARE_PROVIDER_SITE_OTHER): Payer: BC Managed Care – PPO | Admitting: Family Medicine

## 2013-11-25 VITALS — BP 124/72 | HR 90 | Ht 66.0 in | Wt 230.0 lb

## 2013-11-25 DIAGNOSIS — M25511 Pain in right shoulder: Secondary | ICD-10-CM

## 2013-11-25 DIAGNOSIS — M75111 Incomplete rotator cuff tear or rupture of right shoulder, not specified as traumatic: Secondary | ICD-10-CM

## 2013-11-25 DIAGNOSIS — M75110 Incomplete rotator cuff tear or rupture of unspecified shoulder, not specified as traumatic: Secondary | ICD-10-CM

## 2013-11-25 DIAGNOSIS — M25519 Pain in unspecified shoulder: Secondary | ICD-10-CM

## 2013-11-25 NOTE — Addendum Note (Signed)
Addended by: Douglass Rivers T on: 11/25/2013 04:48 PM   Modules accepted: Orders

## 2013-11-25 NOTE — Patient Instructions (Signed)
Good to see you Ice still 20 minutes 2 times a day Exercises 3 times a week Physical therapy will be calling you.  Turmeric with every meal. Table spoon to every meal.  Capsicin cream over the counter can help.  Come back in 3 weeks at that time if still in pain we will do another injection.

## 2013-11-25 NOTE — Progress Notes (Signed)
[facility] [address] [place], [place] [address] Phone: [phone] Subjective:     CC: Right shoulder injury follow up  YIR:[id number] CLASSIE Rebekah Kennedy is a 58 y.o. female coming in for followup of her right shoulder injury. Patient was found to have a rotator cuff tear that was partial previously. Patient was doing extremely better at last followup. Patient did have a cortisone injection greater than 2 months ago. Patient states that now she's noticed that the pain is starting to come back slowly. It is affecting her daily activities but it does wake her up at night. Patient does notice some mild decrease in strength and feels that some range of motion has been difficult. 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 Patient was unable to do anti-inflammatories secondary to being on Plavix. Patient puts the pain is 5/10. Still not as bad as previous first initial visit.     Past medical history, social, surgical and family history all reviewed in electronic medical record.   Review of Systems: No headache, visual changes, nausea, vomiting, diarrhea, constipation, dizziness, abdominal pain, skin rash, fevers, chills, night sweats, weight loss, swollen lymph nodes, body aches, joint swelling, muscle aches, chest pain, shortness of breath, mood changes.   Objective Blood pressure 124/72, pulse 90, height 5\' 6"  (1.676 m), weight 230 lb (104.327 kg), SpO2 97.00%.  General: No apparent distress alert and oriented x3 mood and affect normal, dressed appropriately.  HEENT: Pupils equal, extraocular movements intact  Respiratory: Patient's speak in full sentences and does not appear short of breath  Cardiovascular: No lower extremity edema, non tender, no erythema  Skin: Warm dry intact with no signs of infection or rash on extremities or on axial skeleton.  Abdomen: Soft nontender  Neuro: Cranial nerves II  through XII are intact, neurovascularly intact in all extremities with 2+ DTRs and 2+ pulses.  Lymph: No lymphadenopathy of posterior or anterior cervical chain or axillae bilaterally.  Gait normal with good balance and coordination.  MSK:  Non tender with full range of motion and good stability and symmetric strength and tone of  elbows, wrist, hip, knee and ankles bilaterally.  Shoulder: Right Inspection reveals no abnormalities, atrophy or asymmetry. Palpation reveals generalized tenderness. ROM is full in all planes. Rotator cuff strength 4/5 compared to 5 out of 5 on contralateral side patient, partially somewhat less strong than previous exam. Mild signs of impingement with positive Neer and Hawkin's tests, empty can sign improved from previous exam. Speeds and Yergason's tests normal.  labral pathology noted with mildly positive Obrien's, negative clunk and good stability. Normal scapular function observed. No painful arc and no drop arm sign. No apprehension sign Contralateral shoulder unremarkable.   MSK US performed KK:[id number]  This study was ordered, performed, and interpreted by Charlann Boxer D.O.  Shoulder:  Supraspinatus: Patient does have a tear with 0.3 cm of retraction. Seems to be full-thickness with calcific changes.  Infraspinatus: Appears normal on long and transverse views.  Subscapularis: Has calcific changes within the degenerative rotator cuff tear but no retraction noted.  Teres Minor: Appears normal on long and transverse views.  AC joint: Capsule undistended, no geyser sign.  Glenohumeral Joint: Appears normal without effusion.  Glenoid Labrum: Intact without visualized tears.  Biceps Tendon: Appears normal on long and transverse views, no fraying of tendon, tendon located in intertubercular groove, no subluxation with shoulder internal or external rotation.  No increased  power doppler signal.  Impression: Partial rotator cuff tear      Impression and  Recommendations:     This case required medical decision making of moderate complexity.

## 2013-11-25 NOTE — Assessment & Plan Note (Signed)
Patient overall appears to have had a larger tear of the supraspinatus but does have healing of the subscapular tear that was previously seen. Patient does have calcific changes of does not seem previously. We discussed the possibility of increasing patient's vitamin D to see if this can help with some of the calcium deposits. Patient was getting good results with the tuurmeric previously. We discussed though doing it naturally with raw root and see if this will improve. We discussed icing and over-the-counter topical that could be helpful. Patient will start formal physical therapy and was prescribed today. Patient will follow up with me again in 4 weeks. At continuing to have trouble I would consider doing another cortisone injection.  Spent greater than 25 minutes with patient face-to-face and had greater than 50% of counseling including as described above in assessment and plan.

## 2013-12-03 ENCOUNTER — Ambulatory Visit: Payer: BC Managed Care – PPO | Attending: Family Medicine

## 2013-12-03 DIAGNOSIS — J45909 Unspecified asthma, uncomplicated: Secondary | ICD-10-CM | POA: Diagnosis not present

## 2013-12-03 DIAGNOSIS — R5381 Other malaise: Secondary | ICD-10-CM | POA: Insufficient documentation

## 2013-12-03 DIAGNOSIS — M67919 Unspecified disorder of synovium and tendon, unspecified shoulder: Secondary | ICD-10-CM | POA: Diagnosis not present

## 2013-12-03 DIAGNOSIS — M25619 Stiffness of unspecified shoulder, not elsewhere classified: Secondary | ICD-10-CM | POA: Diagnosis not present

## 2013-12-03 DIAGNOSIS — M25519 Pain in unspecified shoulder: Secondary | ICD-10-CM | POA: Diagnosis not present

## 2013-12-03 DIAGNOSIS — M719 Bursopathy, unspecified: Secondary | ICD-10-CM | POA: Insufficient documentation

## 2013-12-03 DIAGNOSIS — E119 Type 2 diabetes mellitus without complications: Secondary | ICD-10-CM | POA: Insufficient documentation

## 2013-12-03 DIAGNOSIS — I10 Essential (primary) hypertension: Secondary | ICD-10-CM | POA: Diagnosis not present

## 2013-12-03 DIAGNOSIS — IMO0001 Reserved for inherently not codable concepts without codable children: Secondary | ICD-10-CM | POA: Insufficient documentation

## 2013-12-04 ENCOUNTER — Telehealth: Payer: Self-pay

## 2013-12-04 DIAGNOSIS — E785 Hyperlipidemia, unspecified: Secondary | ICD-10-CM

## 2013-12-04 NOTE — Telephone Encounter (Signed)
Diabetic bundle  mychart message sent  A1C monitored by Dr Loanne Drilling last lab done 09-16-13 (to early for a new order)  BP done on 10-14-13 and was 140/86 (doesn't need done yet)  Pt needs lipid panel  Order placed

## 2013-12-09 ENCOUNTER — Ambulatory Visit: Payer: BC Managed Care – PPO | Admitting: Physical Therapy

## 2013-12-11 ENCOUNTER — Ambulatory Visit: Payer: BC Managed Care – PPO | Admitting: Physical Therapy

## 2013-12-14 ENCOUNTER — Ambulatory Visit: Payer: BC Managed Care – PPO

## 2013-12-22 ENCOUNTER — Ambulatory Visit: Payer: BC Managed Care – PPO

## 2013-12-23 ENCOUNTER — Telehealth: Payer: Self-pay | Admitting: Family Medicine

## 2013-12-23 ENCOUNTER — Ambulatory Visit: Payer: BC Managed Care – PPO | Admitting: Family Medicine

## 2013-12-23 DIAGNOSIS — Z0289 Encounter for other administrative examinations: Secondary | ICD-10-CM

## 2013-12-23 NOTE — Telephone Encounter (Signed)
Noted  

## 2013-12-23 NOTE — Telephone Encounter (Signed)
Patient no showed for 4 week fu.  Please advise.

## 2013-12-24 ENCOUNTER — Ambulatory Visit: Payer: BC Managed Care – PPO | Admitting: Physical Therapy

## 2013-12-29 ENCOUNTER — Ambulatory Visit: Payer: BC Managed Care – PPO | Admitting: Physical Therapy

## 2013-12-31 ENCOUNTER — Encounter: Payer: BC Managed Care – PPO | Admitting: Physical Therapy

## 2014-01-04 ENCOUNTER — Other Ambulatory Visit (INDEPENDENT_AMBULATORY_CARE_PROVIDER_SITE_OTHER): Payer: BC Managed Care – PPO

## 2014-01-04 ENCOUNTER — Other Ambulatory Visit: Payer: Self-pay | Admitting: Family Medicine

## 2014-01-04 ENCOUNTER — Other Ambulatory Visit: Payer: Self-pay | Admitting: Internal Medicine

## 2014-01-04 ENCOUNTER — Encounter: Payer: Self-pay | Admitting: Internal Medicine

## 2014-01-04 ENCOUNTER — Ambulatory Visit (INDEPENDENT_AMBULATORY_CARE_PROVIDER_SITE_OTHER): Payer: BC Managed Care – PPO | Admitting: Internal Medicine

## 2014-01-04 VITALS — BP 150/86 | HR 107 | Temp 98.2°F | Wt 239.0 lb

## 2014-01-04 DIAGNOSIS — I10 Essential (primary) hypertension: Secondary | ICD-10-CM

## 2014-01-04 DIAGNOSIS — R05 Cough: Secondary | ICD-10-CM

## 2014-01-04 DIAGNOSIS — J45901 Unspecified asthma with (acute) exacerbation: Secondary | ICD-10-CM

## 2014-01-04 DIAGNOSIS — E785 Hyperlipidemia, unspecified: Secondary | ICD-10-CM

## 2014-01-04 DIAGNOSIS — J4541 Moderate persistent asthma with (acute) exacerbation: Secondary | ICD-10-CM

## 2014-01-04 DIAGNOSIS — R059 Cough, unspecified: Secondary | ICD-10-CM

## 2014-01-04 LAB — LIPID PANEL
Cholesterol: 205 mg/dL — ABNORMAL HIGH (ref 0–200)
HDL: 50 mg/dL (ref >39.00)
LDL Cholesterol: 122 mg/dL — ABNORMAL HIGH (ref 0–99)
NonHDL: 155
TRIGLYCERIDES: 167 mg/dL — AB (ref 0.0–149.0)
Total CHOL/HDL Ratio: 4
VLDL: 33.4 mg/dL (ref 0.0–40.0)

## 2014-01-04 LAB — BASIC METABOLIC PANEL
BUN: 14 mg/dL (ref 6–23)
CALCIUM: 9.1 mg/dL (ref 8.4–10.5)
CHLORIDE: 101 meq/L (ref 96–112)
CO2: 30 meq/L (ref 19–32)
Creatinine, Ser: 0.8 mg/dL (ref 0.4–1.2)
GFR: 102.29 mL/min (ref >60.00)
GLUCOSE: 239 mg/dL — AB (ref 70–99)
Potassium: 3.7 mEq/L (ref 3.5–5.1)
Sodium: 139 mEq/L (ref 135–145)

## 2014-01-04 MED ORDER — METOPROLOL SUCCINATE ER 100 MG PO TB24: ORAL_TABLET | ORAL | Status: DC

## 2014-01-04 MED ORDER — AMLODIPINE BESYLATE 10 MG PO TABS: ORAL_TABLET | ORAL | Status: DC

## 2014-01-04 MED ORDER — BUDESONIDE-FORMOTEROL FUMARATE 160-4.5 MCG/ACT IN AERO: 2.0000 | INHALATION_SPRAY | Freq: Two times a day (BID) | RESPIRATORY_TRACT | Status: DC

## 2014-01-04 MED ORDER — LOSARTAN POTASSIUM 100 MG PO TABS: ORAL_TABLET | ORAL | Status: DC

## 2014-01-04 MED ORDER — MONTELUKAST SODIUM 10 MG PO TABS: 10.0000 mg | ORAL_TABLET | Freq: Every day | ORAL | Status: DC

## 2014-01-04 MED ORDER — SPIRONOLACTONE 25 MG PO TABS: ORAL_TABLET | ORAL | Status: DC

## 2014-01-04 NOTE — Patient Instructions (Signed)
If albuterol, the rescue agent, is needed more than 2-3 times per week except  to prevent exercise-induced symptoms; a maintenance agent such as Symbicort should be used  preventatively on a daily basis.    Minimal Blood Pressure Goal= AVERAGE < 140/90;  Ideal is an AVERAGE < 135/85. This AVERAGE should be calculated from @ least 5-7 BP readings taken @ different times of day on different days of week. You should not respond to isolated BP readings , but rather the AVERAGE for that week .Please bring your  blood pressure cuff to office visits to verify that it is reliable.It  can also be checked against the blood pressure device at the pharmacy. Finger or wrist cuffs are not dependable; an arm cuff is.

## 2014-01-04 NOTE — Progress Notes (Signed)
   Subjective:    Patient ID: Rebekah Kennedy, female    DOB: [birth year], 58 y.o.   MRN: [id number]  HPI  Patient seen today in follow up.  Chronic medical issues reviewed  Asthma - states symptoms have been worsening since May.  She has not taken Symbicort in months. She is using Albuterol prn 2-4 times a day.  She states that humidity makes symptoms worse.    HTN - has not taken medications this morning.  BP recheck 152/94.  Reports compliance with medications.  Denies CV symptoms.    DM - fasting blood sugars 135-140 range. Lats A1C 08/2013 8.9.  She reports polyuria for the last 2 weeks.  Denies pain/burning on urination. Reports compliance with current medications.  Followed by endocrinology - Dr Loanne Drilling  Review of Systems  Constitutional: Negative for chills, activity change and unexpected weight change.  HENT: Negative for congestion and sinus pressure.   Eyes: Negative for pain and itching.  Respiratory: Negative for chest tightness.   Cardiovascular: Negative for leg swelling.  Gastrointestinal: Negative for nausea, vomiting, diarrhea and constipation.  Endocrine: Positive for polyuria (no burning/pain on urination). Negative for cold intolerance, heat intolerance, polydipsia and polyphagia.  Skin: Negative for rash.  Neurological: Negative for dizziness and headaches.       Objective:   Physical Exam  Constitutional: She is oriented to person, place, and time. She appears well-developed and well-nourished. No distress.  obese  HENT:  Head: Normocephalic and atraumatic.  Right Ear: External ear normal.  Left Ear: External ear normal.  Nose: Nose normal.  Mouth/Throat: Oropharynx is clear and moist. No oropharyngeal exudate.  Eyes: Conjunctivae and EOM are normal. Pupils are equal, round, and reactive to light. Right eye exhibits no discharge. Left eye exhibits no discharge.  Neck: Normal range of motion. Neck supple. No thyromegaly present.  Cardiovascular: Normal rate,  regular rhythm and normal heart sounds.   No murmur heard. Pulmonary/Chest: Effort normal. No respiratory distress. She has wheezes.  Abdominal: Soft. Bowel sounds are normal. She exhibits no distension. There is no tenderness.  Musculoskeletal: Normal range of motion.  Lymphadenopathy:    She has no cervical adenopathy.  Neurological: She is alert and oriented to person, place, and time.  Skin: Skin is warm and dry. She is not diaphoretic.  Psychiatric: She has a normal mood and affect. Her behavior is normal. Judgment and thought content normal.          Assessment & Plan:

## 2014-01-04 NOTE — Progress Notes (Signed)
Pre visit review using our clinic review tool, if applicable. No additional management support is needed unless otherwise documented below in the visit note. 

## 2014-01-04 NOTE — Progress Notes (Signed)
Subjective:    Patient ID: Rebekah Kennedy, female    DOB: [birth year], 57 y.o.   MRN: [id number]  HPI  She has had wheezing since May of this year; she has stopped Symbicort. She's been using her rescue inhaler 2-3 times a day.  Associated symptoms include rhinitis. She's also had nonproductive cough. The wheezing and shortness of breath have progressed over the last month.  Additionally she needs BP med refills. She has not taken her blood pressure medicines today. She states she's been compliant with her medicines. She has no adverse effects from these. She is not monitoring blood pressure at home.  [facility]  She states that her fasting blood sugars have improved and range 135-140.    Review of Systems Frontal headache, facial pain , nasal purulence, dental pain, sore throat , otic pain or otic discharge denied. No fever , chills or sweats.  Chest pain, palpitations, tachycardia,  paroxysmal nocturnal dyspnea, claudication or edema are absent.       Objective:   Physical Exam     As per CDC Guidelines ,Epic documents obesity as being present . Gen.:  well-nourished in appearance. Alert, appropriate and cooperative throughout exam. Head: Normocephalic without obvious abnormalities Eyes: No corneal or conjunctival inflammation noted. Ears: External  ear exam reveals no significant lesions or deformities. Canals clear .TMs normal.  Nose: External nasal exam reveals no deformity or inflammation. Nasal mucosa are pink and moist. No lesions or exudates noted.   Mouth: Oral mucosa and oropharynx reveal no lesions or exudates. Teeth in good repair. Neck: No deformities, masses, or tenderness noted.  Thyroid normal. Lungs: Normal respiratory effort; chest expands symmetrically. Breath sounds are generally decreased. She has minor wheezing over the upper airways. No increased work of breathing present Heart: Normal rate and rhythm. Normal  S1 and S2. No gallop, click, or rub. No murmur. Abdomen: Massively protuberant. Bowel sounds normal; abdomen soft and nontender. No masses, organomegaly or hernias noted.                             Musculoskeletal/extremities: No deformity or scoliosis noted of  the thoracic or lumbar spine.  No clubbing, cyanosis, edema, or significant extremity  deformity noted. Range of motion decreased at the right shoulder due to rotator cuff issues .Tone & strength normal. Hand joints normal  Fingernail health good. Able to lie down & sit up w/o help. Vascular: Carotid, radial artery, dorsalis pedis and  posterior tibial pulses are full and equal. No bruits present. Neurologic: Alert and oriented x3. Deep tendon reflexes symmetrical and normal.  Gait normal . Skin: Intact without suspicious lesions or rashes. Lymph: No cervical, axillary lymphadenopathy present. Psych: Mood and affect are normal. Normally interactive                                                                                        Assessment & Plan:  #1 moderate persistent asthma; no maintenance therapy on board. Pathophysiology of asthma and its treatment discussed.  #2 essential hypertension in  the context of prior stroke. Blood pressure goals and risks were discussed  Plan: See orders and recommendations

## 2014-01-09 ENCOUNTER — Encounter: Payer: Self-pay | Admitting: Gastroenterology

## 2014-01-13 ENCOUNTER — Encounter: Payer: Self-pay | Admitting: Endocrinology

## 2014-01-13 ENCOUNTER — Ambulatory Visit (INDEPENDENT_AMBULATORY_CARE_PROVIDER_SITE_OTHER): Payer: BC Managed Care – PPO | Admitting: Endocrinology

## 2014-01-13 ENCOUNTER — Other Ambulatory Visit: Payer: Self-pay

## 2014-01-13 VITALS — BP 122/76 | HR 93 | Temp 98.0°F | Ht 66.0 in | Wt 238.0 lb

## 2014-01-13 DIAGNOSIS — E1065 Type 1 diabetes mellitus with hyperglycemia: Principal | ICD-10-CM

## 2014-01-13 DIAGNOSIS — E1039 Type 1 diabetes mellitus with other diabetic ophthalmic complication: Secondary | ICD-10-CM

## 2014-01-13 MED ORDER — TRIAMCINOLONE ACETONIDE 0.1 % EX CREA: 1.0000 "application " | TOPICAL_CREAM | Freq: Three times a day (TID) | CUTANEOUS | Status: DC

## 2014-01-13 NOTE — Progress Notes (Signed)
Subjective:    Patient ID: Rebekah Kennedy, female    DOB: 1955/12/21, 58 y.o.   MRN: [id number]  HPI Pt returns for f/u of insulin-requiring DM (dx'ed 2004, when she presented with severe hyperglycemia; she has mild if any neuropathy of the lower extremities; she has associated complications of CVA, nephropathy, and retinopathy; due to noncompliance with insulin injections and cbg monitoring, she is on a simple qd insulin regimen; she has never had pancreatitis, severe hypoglycemia, or DKA; she declines weight-loss surgery for now).  Since last ov, she has had only 1 episode of hypoglycemia, and this was mild.  no cbg record, but states cbg's vary from 98-350.  It is in general higher as the day goes on.   Pt states few weeks of slight itching of the left neck, and assoc rash. Past Medical History  Diagnosis Date  . DIABETES MELLITUS, TYPE II 12/01/2006  . HYPERLIPIDEMIA 12/01/2006  . HYPERTENSION 07/07/2006  . DEPRESSION 03/16/2007  . CHEST PAIN 01/23/2007  . ASTHMA NOS W/ACUTE EXACERBATION 07/10/2010  . Obesity   . Splenomegaly     in college  . Dysmenorrhea   . Fibroid   . TIA (transient ischemic attack) 2012  . 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 Cervical dysplasia   . Cataract     Past Surgical History  Procedure Laterality Date  . Appendectomy    . Myomectomy    . Ovarian cyst removal    . 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 Breast surgery      Reduction  . Pelvic laparoscopy  75,88    DL lysis of adhesions  . Dilation and curettage of uterus  1975    DUB  . Biopsy thyroid  05/02/11    Nonneoplastic goiter  . Gynecologic cryosurgery    . Colposcopy    . Eye surgery      History   Social History  . Marital Status: Single    Spouse Name: N/A    Number of Children: 2  . Years of Education: N/A   Occupational History  . TEACHER    Social History Main Topics  . Smoking status: Never Smoker   . Smokeless tobacco: Never Used  . Alcohol  Use: No     Comment: very rare  . Drug Use: No  . Sexual Activity: Not Currently    Birth Control/ Protection: Post-menopausal   Other Topics Concern  . 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 amLODipine (NORVASC) 10 MG tablet TAKE ONE TABLET BY MOUTH ONE TIME DAILY  90 tablet  1  . atorvastatin (LIPITOR) 40 MG tablet TAKE  ONE TABLET BY MOUTH NIGHTLY AT BEDTIME  3090 tablet  3  . B-D ULTRAFINE III SHORT PEN 31G X 8 MM MISC       . budesonide-formoterol (SYMBICORT) 160-4.5 MCG/ACT inhaler Inhale 2 puffs into the lungs 2 (two) times daily.  1 Inhaler  5  . clopidogrel (PLAVIX) 75 MG tablet Take 1 tablet (75 mg total) by mouth daily with breakfast.  90 tablet  3  . desonide (DESOWEN) 0.05 % cream       . gabapentin (NEURONTIN) 100 MG capsule One pill every eight  hours as needed; dose may be increased by one pill each dose after 72 hours if only partially effective  30 capsule  2  . glucose blood (ONE TOUCH ULTRA TEST) test strip Check blood sugar 2/day.  200 each  0  . Insulin Glargine (LANTUS) 100 UNIT/ML Solostar Pen Inject 350 Units into the skin every morning. And pen needles 2/day  240 mL  11  . Insulin Syringe-Needle U-100 (B-D INS SYR ULTRAFINE 1CC/31G) 31G X 5/16" 1 ML MISC 4 Devices by Other route daily.  120 each  11  . losartan (COZAAR) 100 MG tablet Take one tablet by mouth one time daily  90 tablet  1  . meloxicam (MOBIC) 15 MG tablet Take 1 tablet (15 mg total) by mouth daily.  30 tablet  0  . metoprolol succinate (TOPROL-XL) 100 MG 24 hr tablet PT IS DUE COMPLETE PHYSICAL.  TAKE 1 TABLET BY MOUTH ONE TIME DAILY.  90 tablet  0  . montelukast (SINGULAIR) 10 MG tablet Take 1 tablet (10 mg total) by mouth at bedtime. 1 tablet by mouth once daily (necessary for asthma control)  30 tablet  11    . Multiple Vitamin (MULTIVITAMIN) tablet Take 1 tablet by mouth daily.        . NONFORMULARY OR COMPOUNDED ITEM Estradiol .02% 1 ML Prefilled Applicator Sig: apply vaginally twice a week #90 Day Supply with 4 refills  1 each  4  . spironolactone (ALDACTONE) 25 MG tablet TAKE ONE TABLET BY MOUTH TWICE DAILY  180 tablet  1  . traMADol (ULTRAM) 50 MG tablet Take 1 tablet (50 mg total) by mouth at bedtime as needed.  30 tablet  0  . VENTOLIN HFA 108 (90 BASE) MCG/ACT inhaler Inhale 2puffs every 4hrs as needed for wheezing and 1-2 puffs every 4hrs as needed for shortness of breath.  18 g  5   No current facility-administered medications on file prior to visit.    Allergies  Allergen Reactions  . Quinapril     cough    Family History  Problem Relation Age of Onset  . Cancer Maternal Grandmother     Colon Cancer  . Asthma Maternal Grandmother   . Diabetes Father   . Heart disease Father   . Hypertension Father   . Hyperlipidemia Father   . Hypertension Mother   . Heart disease Mother   . Ovarian cancer Mother   . Cancer Mother     Lung cancer  . Asthma Mother   . COPD Mother   . Hyperlipidemia Mother   . Diabetes Brother   . Hypertension Brother   . Kidney disease Brother   . Asthma Brother   . Heart disease Brother   . Hyperlipidemia Brother   . Graves' disease Sister   . Diabetes Sister   . Graves' disease Paternal Grandmother   . Hypertension Paternal Grandmother   . Heart disease Paternal Grandmother   . Alzheimer's disease Paternal Grandmother   . Cancer Maternal Grandfather     BP 122/76  Pulse 93  Temp(Src) 98 F (36.7 C) (Oral)  Ht 5\' 6"  (1.676 m)  Wt 238 lb (107.956 kg)  BMI 38.43 kg/m2  SpO2 99%    Review of Systems Denies LOC and weight change.      Objective:   Physical Exam VITAL SIGNS:  See vs page. GENERAL: no distress. Pulses: dorsalis pedis intact bilat.   Feet: no deformity. normal color and temp.  no edema. Skin:  no  ulcer on the  feet.   Neuro: sensation is intact to touch on the feet.      Assessment & Plan:  DM: mild exacerbation Noncompliance with cbg recording: I'll work around this as best I can Rash, new, uncertain etiology.   Patient is advised the following: Patient Instructions  A diabetes blood test is requested for you today.  We'll contact you with results.    On this type of insulin schedule, you should eat meals on a regular schedule.  If a meal is missed or significantly delayed, your blood sugar could go low. check your blood sugar 2 times a day.  vary the time of day when you check, between before the 3 meals, and at bedtime.  also check if you have symptoms of your blood sugar being too high or too low.  please keep a record of the readings and bring it to your next appointment here.  please call us sooner if you are having low blood sugar episodes, or if it stays over 200.   Please come back for a follow-up appointment in 3 months.  i have sent a prescription to your pharmacy, for the rash.

## 2014-01-13 NOTE — Patient Instructions (Addendum)
A diabetes blood test is requested for you today.  We'll contact you with results.    On this type of insulin schedule, you should eat meals on a regular schedule.  If a meal is missed or significantly delayed, your blood sugar could go low. check your blood sugar 2 times a day.  vary the time of day when you check, between before the 3 meals, and at bedtime.  also check if you have symptoms of your blood sugar being too high or too low.  please keep a record of the readings and bring it to your next appointment here.  please call us sooner if you are having low blood sugar episodes, or if it stays over 200.   Please come back for a follow-up appointment in 3 months.  i have sent a prescription to your pharmacy, for the rash.

## 2014-01-18 ENCOUNTER — Encounter (HOSPITAL_COMMUNITY): Payer: Self-pay | Admitting: Emergency Medicine

## 2014-01-18 ENCOUNTER — Emergency Department (HOSPITAL_COMMUNITY): Payer: BC Managed Care – PPO

## 2014-01-18 ENCOUNTER — Emergency Department (HOSPITAL_COMMUNITY)
Admission: EM | Admit: 2014-01-18 | Discharge: 2014-01-18 | Disposition: A | Discharge status: Home / Self Care | Payer: BC Managed Care – PPO | Attending: Emergency Medicine | Admitting: Emergency Medicine

## 2014-01-18 DIAGNOSIS — E119 Type 2 diabetes mellitus without complications: Secondary | ICD-10-CM | POA: Insufficient documentation

## 2014-01-18 DIAGNOSIS — S4980XA Other specified injuries of shoulder and upper arm, unspecified arm, initial encounter: Secondary | ICD-10-CM | POA: Insufficient documentation

## 2014-01-18 DIAGNOSIS — X500XXA Overexertion from strenuous movement or load, initial encounter: Secondary | ICD-10-CM | POA: Diagnosis not present

## 2014-01-18 DIAGNOSIS — W010XXA Fall on same level from slipping, tripping and stumbling without subsequent striking against object, initial encounter: Secondary | ICD-10-CM | POA: Insufficient documentation

## 2014-01-18 DIAGNOSIS — Z8673 Personal history of transient ischemic attack (TIA), and cerebral infarction without residual deficits: Secondary | ICD-10-CM | POA: Insufficient documentation

## 2014-01-18 DIAGNOSIS — Z794 Long term (current) use of insulin: Secondary | ICD-10-CM | POA: Diagnosis not present

## 2014-01-18 DIAGNOSIS — Z791 Long term (current) use of non-steroidal anti-inflammatories (NSAID): Secondary | ICD-10-CM | POA: Insufficient documentation

## 2014-01-18 DIAGNOSIS — Y9389 Activity, other specified: Secondary | ICD-10-CM | POA: Insufficient documentation

## 2014-01-18 DIAGNOSIS — S46909A Unspecified injury of unspecified muscle, fascia and tendon at shoulder and upper arm level, unspecified arm, initial encounter: Secondary | ICD-10-CM | POA: Insufficient documentation

## 2014-01-18 DIAGNOSIS — Z8659 Personal history of other mental and behavioral disorders: Secondary | ICD-10-CM | POA: Insufficient documentation

## 2014-01-18 DIAGNOSIS — E669 Obesity, unspecified: Secondary | ICD-10-CM | POA: Insufficient documentation

## 2014-01-18 DIAGNOSIS — S4991XA Unspecified injury of right shoulder and upper arm, initial encounter: Secondary | ICD-10-CM

## 2014-01-18 DIAGNOSIS — Z8669 Personal history of other diseases of the nervous system and sense organs: Secondary | ICD-10-CM | POA: Insufficient documentation

## 2014-01-18 DIAGNOSIS — Z79899 Other long term (current) drug therapy: Secondary | ICD-10-CM | POA: Insufficient documentation

## 2014-01-18 DIAGNOSIS — E785 Hyperlipidemia, unspecified: Secondary | ICD-10-CM | POA: Insufficient documentation

## 2014-01-18 DIAGNOSIS — J45901 Unspecified asthma with (acute) exacerbation: Secondary | ICD-10-CM | POA: Insufficient documentation

## 2014-01-18 DIAGNOSIS — Z8742 Personal history of other diseases of the female genital tract: Secondary | ICD-10-CM | POA: Insufficient documentation

## 2014-01-18 DIAGNOSIS — Z7901 Long term (current) use of anticoagulants: Secondary | ICD-10-CM | POA: Insufficient documentation

## 2014-01-18 DIAGNOSIS — I10 Essential (primary) hypertension: Secondary | ICD-10-CM | POA: Diagnosis not present

## 2014-01-18 DIAGNOSIS — Y9289 Other specified places as the place of occurrence of the external cause: Secondary | ICD-10-CM | POA: Diagnosis not present

## 2014-01-18 MED ORDER — TRAMADOL HCL 50 MG PO TABS: 50.0000 mg | ORAL_TABLET | Freq: Four times a day (QID) | ORAL | Status: DC | PRN

## 2014-01-18 MED ORDER — IBUPROFEN 400 MG PO TABS: 400.0000 mg | ORAL_TABLET | Freq: Four times a day (QID) | ORAL | Status: DC | PRN

## 2014-01-18 NOTE — ED Notes (Signed)
Patient transported to X-ray 

## 2014-01-18 NOTE — ED Provider Notes (Signed)
CSN: [id number]     Arrival date & time 01/18/14  4098 History   First MD Initiated Contact with Patient 01/18/14 1002     Chief Complaint  Patient presents with  . Shoulder Injury    right     (Consider location/radiation/quality/duration/timing/severity/associated sxs/prior Treatment) HPI Comments: Patients with history of right rotator cuff injury, status post therapy, presents with complaint of right shoulder pain after a fall this morning. Patient states that she tripped over a stack that was on the floor. She reached out with her right arm and landed on her right shoulder. She denies hitting her head or injuring her neck. She currently has anterior shoulder pain. No treatments prior to arrival. She denies numbness or tingling of her right upper extremity.  Patient is a 58 y.o. female presenting with shoulder injury. The history is provided by the patient.  Shoulder Injury Associated symptoms include arthralgias. Pertinent negatives include no joint swelling, neck pain, numbness or weakness.    Past Medical History  Diagnosis Date  . DIABETES MELLITUS, TYPE II 12/01/2006  . HYPERLIPIDEMIA 12/01/2006  . HYPERTENSION 07/07/2006  . DEPRESSION 03/16/2007  . CHEST PAIN 01/23/2007  . ASTHMA NOS W/ACUTE EXACERBATION 07/10/2010  . Obesity   . Splenomegaly     in college  . Dysmenorrhea   . Fibroid   . TIA (transient ischemic attack) 2012  . 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 Cervical dysplasia   . Cataract    Past Surgical History  Procedure Laterality Date  . Appendectomy    . Myomectomy    . Ovarian cyst removal    . 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 Breast surgery      Reduction  . Pelvic laparoscopy  75,88    DL lysis of adhesions  . Dilation and curettage of uterus  1975    DUB  . Biopsy thyroid  05/02/11    Nonneoplastic goiter  . Gynecologic cryosurgery    . Colposcopy    . Eye surgery     Family History  Problem Relation Age of Onset   . Cancer Maternal Grandmother     Colon Cancer  . Asthma Maternal Grandmother   . Diabetes Father   . Heart disease Father   . Hypertension Father   . Hyperlipidemia Father   . Hypertension Mother   . Heart disease Mother   . Ovarian cancer Mother   . Cancer Mother     Lung cancer  . Asthma Mother   . COPD Mother   . Hyperlipidemia Mother   . Diabetes Brother   . Hypertension Brother   . Kidney disease Brother   . Asthma Brother   . Heart disease Brother   . Hyperlipidemia Brother   . Graves' disease Sister   . Diabetes Sister   . Graves' disease Paternal Grandmother   . Hypertension Paternal Grandmother   . Heart disease Paternal Grandmother   . Alzheimer's disease Paternal Grandmother   . Cancer Maternal Grandfather    History  Substance Use Topics  . Smoking status: Never Smoker   . Smokeless tobacco: Never Used  . Alcohol Use: No     Comment: very rare   OB History   Grav Para Term Preterm Abortions TAB SAB Ect Mult Living   0              Review of Systems  Constitutional: Negative for activity change.  Eyes: Negative for visual  disturbance.  Musculoskeletal: Positive for arthralgias. Negative for back pain, joint swelling and neck pain.  Skin: Negative for wound.  Neurological: Negative for weakness and numbness.      Allergies  Quinapril  Home Medications   Prior to Admission medications   Medication Sig Start Date End Date Taking? Authorizing Provider  albuterol (PROVENTIL HFA;VENTOLIN HFA) 108 (90 BASE) MCG/ACT inhaler Inhale 2 puffs into the lungs every 4 (four) hours as needed for wheezing or shortness of breath.   Yes Historical Provider, MD  amLODipine (NORVASC) 10 MG tablet Take 10 mg by mouth daily.   Yes Historical Provider, MD  atorvastatin (LIPITOR) 40 MG tablet Take 40 mg by mouth at bedtime.   Yes Historical Provider, MD  budesonide-formoterol (SYMBICORT) 160-4.5 MCG/ACT inhaler Inhale 2 puffs into the lungs 2 (two) times daily.   Yes  Historical Provider, MD  clopidogrel (PLAVIX) 75 MG tablet Take 75 mg by mouth daily.   Yes Historical Provider, MD  insulin glargine (LANTUS) 100 UNIT/ML injection Inject into the skin at bedtime.   Yes Historical Provider, MD  losartan (COZAAR) 100 MG tablet Take 100 mg by mouth daily.   Yes Historical Provider, MD  meloxicam (MOBIC) 15 MG tablet Take 15 mg by mouth daily.   Yes Historical Provider, MD  metoprolol succinate (TOPROL-XL) 100 MG 24 hr tablet Take 100 mg by mouth daily. Take with or immediately following a meal.   Yes Historical Provider, MD  montelukast (SINGULAIR) 10 MG tablet Take 10 mg by mouth at bedtime.   Yes Historical Provider, MD  spironolactone (ALDACTONE) 25 MG tablet Take 25 mg by mouth 2 (two) times daily.   Yes Historical Provider, MD  traMADol (ULTRAM) 50 MG tablet Take 50 mg by mouth at bedtime as needed for moderate pain.   Yes Historical Provider, MD  triamcinolone cream (KENALOG) 0.1 % Apply 1 application topically 3 (three) times daily as needed (rash).   Yes Historical Provider, MD  desonide (DESOWEN) 0.05 % cream  01/03/12   Historical Provider, MD  Multiple Vitamin (MULTIVITAMIN) tablet Take 1 tablet by mouth daily.      Historical Provider, MD  NONFORMULARY OR COMPOUNDED ITEM Estradiol .02% 1 ML Prefilled Applicator Sig: apply vaginally twice a week #90 Day Supply with 4 refills 10/05/13   Terrance Mass, MD   BP 159/94  Pulse 98  Temp(Src) 98 F (36.7 C) (Oral)  Resp 16  SpO2 94%  Physical Exam  Nursing note and vitals reviewed. Constitutional: She appears well-developed and well-nourished.  HENT:  Head: Normocephalic and atraumatic.  Eyes: Pupils are equal, round, and reactive to light.  Neck: Normal range of motion. Neck supple.  Cardiovascular: Exam reveals no decreased pulses.   Pulses:      Radial pulses are 2+ on the right side, and 2+ on the left side.  Musculoskeletal: She exhibits tenderness. She exhibits no edema.       Right  shoulder: She exhibits decreased range of motion and tenderness. She exhibits no bony tenderness and no deformity.       Left shoulder: Normal.       Right elbow: Normal.      Right wrist: Normal.       Cervical back: She exhibits normal range of motion, no tenderness and no bony tenderness.       Right upper arm: Normal.       Right forearm: Normal.       Right hand: Normal.  Tender to palpation  anterior R shoulder, seems to be most localized around [facility] joint. No tenderness over upper arm or humeral head.   Neurological: She is alert. No sensory deficit.  Motor, sensation, and vascular distal to the injury is fully intact.   Skin: Skin is warm and dry.  Psychiatric: She has a normal mood and affect.    ED Course  Procedures (including critical care time) Labs Review Labs Reviewed - No data to display  Imaging Review Dg Shoulder Right  01/18/2014   CLINICAL DATA:  Status post fall with generalize right shoulder pain and limited range of motion  EXAM: RIGHT SHOULDER - 2+ VIEW  COMPARISON:  None.  FINDINGS: The bones of the shoulder are adequately mineralized for age. The glenohumeral joint appears to be appropriately positioned. No acute fracture of the bony glenoid nor of the proximal humerus is demonstrated. There are small subacromial spurs. The [facility] joint is grossly intact. The observed portions of the right clavicle and upper right ribs are normal.  IMPRESSION: There are mild degenerative changes of the right shoulder. There is no acute fracture nor dislocation.   Electronically Signed   By: David  Martinique   On: 01/18/2014 10:14     EKG Interpretation None      10:23 AM Patient seen and examined. X-ray results reviewed. Pt informed.    Vital signs reviewed and are as follows: BP 159/94  Pulse 98  Temp(Src) 98 F (36.7 C) (Oral)  Resp 16  SpO2 94%  Patient counseled on use of narcotic pain medications. Counseled not to combine these medications with others containing tylenol.  Urged not to drink alcohol, drive, or perform any other activities that requires focus while taking these medications. The patient verbalizes understanding and agrees with the plan.  Urged follow-up with Dr. Tamala Julian.   MDM   Final diagnoses:  Right shoulder injury, initial encounter   Patient with right shoulder injury. X-rays are negative. The right upper extremity is neurovascularly intact. No emergent interventions indicated at this point.    Carlisle Cater, PA-C 01/18/14 1027

## 2014-01-18 NOTE — Discharge Instructions (Signed)
Please read and follow all provided instructions.  Your diagnoses today include:  1. Right shoulder injury, initial encounter     Tests performed today include:  An x-ray of the affected area - does NOT show any broken bones  Vital signs. See below for your results today.   Medications prescribed:   Tramadol - narcotic-like pain medication  DO NOT drive or perform any activities that require you to be awake and alert because this medicine can make you drowsy.    Ibuprofen (Motrin, Advil) - anti-inflammatory pain medication  Do not exceed 400mg  ibuprofen every 6 hours, take with food  You have been prescribed an anti-inflammatory medication or NSAID. Take with food. Take smallest effective dose for the shortest duration needed for your pain. Stop taking if you experience stomach pain or vomiting.   Take any prescribed medications only as directed.  Home care instructions:   Follow any educational materials contained in this packet  Follow R.I.C.E. Protocol:  R - rest your injury   I  - use ice on injury without applying directly to skin  C - compress injury with bandage or splint  E - elevate the injury as much as possible  Follow-up instructions: Please follow-up with your primary care provider or the provided orthopedic physician (bone specialist) if you continue to have significant pain or trouble walking in 1 week. In this case you may have a severe injury that requires further care.   Return instructions:   Please return if your toes are numb or tingling, appear gray or blue, or you have severe pain (also elevate leg and loosen splint or wrap if you were given one)  Please return to the Emergency Department if you experience worsening symptoms.   Please return if you have any other emergent concerns.  Additional Information:  Your vital signs today were: BP 159/94   Pulse 98   Temp(Src) 98 F (36.7 C) (Oral)   Resp 16   SpO2 94% If your blood pressure (BP)  was elevated above 135/85 this visit, please have this repeated by your doctor within one month. --------------

## 2014-01-18 NOTE — ED Notes (Addendum)
Pt was rushing to get out of the house this morning when she tripped over items she had together to donate. Pt states tried to embrace herself from hitting her face and heard a pop noise in right shoulder area where she already has two rotator cuff tears.  Pt also has abrasion to her right knee.  Pt does take Plavix.  Pt denies hitting her head.   Pt also c/o pain in her right fingers.

## 2014-01-19 NOTE — ED Provider Notes (Signed)
Medical screening examination/treatment/procedure(s) were performed by non-physician practitioner and as supervising physician I was immediately available for consultation/collaboration.   EKG Interpretation None        Hoy Morn, MD 01/19/14 1600

## 2014-01-20 ENCOUNTER — Ambulatory Visit: Payer: BC Managed Care – PPO

## 2014-01-20 DIAGNOSIS — E1039 Type 1 diabetes mellitus with other diabetic ophthalmic complication: Secondary | ICD-10-CM

## 2014-01-20 DIAGNOSIS — E1065 Type 1 diabetes mellitus with hyperglycemia: Principal | ICD-10-CM

## 2014-01-20 LAB — HEMOGLOBIN A1C: HEMOGLOBIN A1C: 7.7 % — AB (ref 4.6–6.5)

## 2014-01-22 ENCOUNTER — Telehealth: Payer: Self-pay | Admitting: Internal Medicine

## 2014-01-22 MED ORDER — ALBUTEROL SULFATE HFA 108 (90 BASE) MCG/ACT IN AERS: 2.0000 | INHALATION_SPRAY | RESPIRATORY_TRACT | Status: DC | PRN

## 2014-01-22 NOTE — Telephone Encounter (Signed)
Pt needs re-fill on albuterol (PROVENTIL HFA;VENTOLIN HFA) 108 (90 BASE) MCG/ACT inhaler [id number] [address], [place] [phone] (Phone) [phone] (Fax)

## 2014-01-27 ENCOUNTER — Ambulatory Visit (INDEPENDENT_AMBULATORY_CARE_PROVIDER_SITE_OTHER): Payer: BC Managed Care – PPO | Admitting: Family Medicine

## 2014-01-27 ENCOUNTER — Other Ambulatory Visit (INDEPENDENT_AMBULATORY_CARE_PROVIDER_SITE_OTHER): Payer: BC Managed Care – PPO

## 2014-01-27 ENCOUNTER — Encounter: Payer: Self-pay | Admitting: Family Medicine

## 2014-01-27 VITALS — BP 136/86 | HR 92 | Ht 66.0 in | Wt 241.0 lb

## 2014-01-27 DIAGNOSIS — S46001A Unspecified injury of muscle(s) and tendon(s) of the rotator cuff of right shoulder, initial encounter: Secondary | ICD-10-CM

## 2014-01-27 DIAGNOSIS — S46909A Unspecified injury of unspecified muscle, fascia and tendon at shoulder and upper arm level, unspecified arm, initial encounter: Secondary | ICD-10-CM

## 2014-01-27 DIAGNOSIS — M25511 Pain in right shoulder: Secondary | ICD-10-CM

## 2014-01-27 DIAGNOSIS — M25519 Pain in unspecified shoulder: Secondary | ICD-10-CM

## 2014-01-27 DIAGNOSIS — S4980XA Other specified injuries of shoulder and upper arm, unspecified arm, initial encounter: Secondary | ICD-10-CM

## 2014-01-27 NOTE — Progress Notes (Signed)
Rebekah Kennedy, Rebekah Kennedy Phone: [phone] Subjective:    I'm seeing this patient by the request  of:  Unice Cobble, MD   CC: Right shoulder injury  [id number] Rebekah Kennedy is a 58 y.o. female coming in with complaint of right shoulder injury . Patient was doing significantly better after her rotator cuff tear. Patient was having full strength and then unfortunately 10 days ago did fall onto her right side. Patient was seen in the emergency department. X-rays are taken. These were reviewed by me today. Patient's x-rays show mild degenerative changes otherwise no significant bony abnormality. Patient states at this time she continues to have a dull aching pain with limited range of motion. States that it feels similar to her previous rotator cuff tear on this side. Patient states that it is waking her up at night and describes the pain as 7/10. Denies any radiation down the arm or any numbness. Not responding to the tramadol that was given to her by the emergency department.     Past medical history, social, surgical and family history all reviewed in electronic medical record.   Review of Systems: No headache, visual changes, nausea, vomiting, diarrhea, constipation, dizziness, abdominal pain, skin rash, fevers, chills, night sweats, weight loss, swollen lymph nodes, body aches, joint swelling, muscle aches, chest pain, shortness of breath, mood changes.   Objective Blood pressure 136/86, pulse 92, height 5\' 6"  (1.676 m), weight 241 lb (109.317 kg), SpO2 96.00%.  General: No apparent distress alert and oriented x3 mood and affect normal, dressed appropriately.  HEENT: Pupils equal, extraocular movements intact  Respiratory: Patient's speak in full sentences and does not appear short of breath  Cardiovascular: No lower extremity edema, non tender, no erythema  Skin: Warm dry intact with no signs of infection or rash on  extremities or on axial skeleton.  Abdomen: Soft nontender  Neuro: Cranial nerves II through XII are intact, neurovascularly intact in all extremities with 2+ DTRs and 2+ pulses.  Lymph: No lymphadenopathy of posterior or anterior cervical chain or axillae bilaterally.  Gait normal with good balance and coordination.  MSK:  Non tender with full range of motion and good stability and symmetric strength and tone of  elbows, wrist, hip, knee and ankles bilaterally.  Shoulder: Right Inspection reveals no abnormalities, atrophy or asymmetry. Palpation reveals generalized tenderness. ROM is full in all planes. Rotator cuff strength 4/5 compared to 5 out of 5 on contralateral side. Very tender to generalized tenderness. signs of impingement with positive Neer and Hawkin's tests, empty can sign. Speeds and Yergason's tests normal. No labral pathology noted with negative Obrien's, negative clunk and good stability. Normal scapular function observed. No painful arc and no drop arm sign. No apprehension sign Contralateral shoulder unremarkable.  MSK US performed [id number] This study was ordered, performed, and interpreted by Charlann Boxer D.O.  Shoulder:   Supraspinatus:  Patient has no true tear but there is calcific changes and hypoechoic changes.. No retraction noted. Infraspinatus:  Appears normal on long and transverse views. Subscapularis:  Patient's tear is completely resolved but does have hypoechoic changes in the area. Teres Minor:  Appears normal on long and transverse views. AC joint:  Capsule undistended, no geyser sign. Glenohumeral Joint:  Appears normal without effusion. Glenoid Labrum:  Intact without visualized tears. Biceps Tendon:  Appears normal on long and transverse views, no fraying of tendon, tendon located in intertubercular groove, no subluxation with shoulder  internal or external rotation. No increased power doppler signal.  Impression: Rotator cuff tear with rotator cuff  contusion   Procedure: Real-time Ultrasound Guided Injection of right glenohumeral joint Device: GE Logiq E  Ultrasound guided injection is preferred based studies that show increased duration, increased effect, greater accuracy, decreased procedural pain, increased response rate with ultrasound guided versus blind injection.  Verbal informed consent obtained.  Time-out conducted.  Noted no overlying erythema, induration, or other signs of local infection.  Skin prepped in a sterile fashion.  Local anesthesia: Topical Ethyl chloride.  With sterile technique and under real time ultrasound guidance:  Joint visualized.  23g 1  inch needle inserted posterior approach. Pictures taken for needle placement. Patient did have injection of 2 cc of 1% lidocaine, 2 cc of 0.5% Marcaine, and 1.0 cc of Kenalog 40 mg/dL. Completed without difficulty  Pain immediately resolved suggesting accurate placement of the medication.  Advised to call if fevers/chills, erythema, induration, drainage, or persistent bleeding.  Images permanently stored and available for review in the ultrasound unit.  Impression: Technically successful ultrasound guided injection.     Impression and Recommendations:     This case required medical decision making of moderate complexity.

## 2014-01-27 NOTE — Assessment & Plan Note (Signed)
Patient has more of a rotator cuff contusion and calcific changes of previous rotator cuff injury. I do not see any worsening tearing at this time. Patient was given an injection with good resolution of pain and increasing range of motion. Patient overall is doing significantly better. Patient will continue with conservative therapy and was given home exercise program and icing protocol. We discussed increasing her tramadol 2 times daily. Patient's other comorbidities and patient being on Plavix we avoided anti-inflammatories. We discussed Tylenol over-the-counter could be beneficial. Patient will try this and come back and see me again in 3-4 weeks. Continuing to have difficulty he may need to consider further imaging.  Spent greater than 25 minutes with patient face-to-face and had greater than 50% of counseling including as described above in assessment and plan.

## 2014-01-27 NOTE — Patient Instructions (Signed)
It is good to see you.  Ice 20 minutes 2 times daily. Usually after activity and before bed. Exercises 3 times a week.  You can double up the tramadol if needed especially at night.  I would add tylenol 650 mg 3 times a day for next 10 days.  Come back and see me again in 3-4 weeks.

## 2014-03-02 ENCOUNTER — Ambulatory Visit (INDEPENDENT_AMBULATORY_CARE_PROVIDER_SITE_OTHER): Payer: BC Managed Care – PPO | Admitting: Internal Medicine

## 2014-03-02 ENCOUNTER — Encounter: Payer: Self-pay | Admitting: Internal Medicine

## 2014-03-02 ENCOUNTER — Other Ambulatory Visit: Payer: BC Managed Care – PPO

## 2014-03-02 VITALS — BP 100/80 | HR 96 | Temp 98.5°F | Wt 237.1 lb

## 2014-03-02 DIAGNOSIS — R8290 Unspecified abnormal findings in urine: Secondary | ICD-10-CM

## 2014-03-02 DIAGNOSIS — R252 Cramp and spasm: Secondary | ICD-10-CM

## 2014-03-02 DIAGNOSIS — M549 Dorsalgia, unspecified: Secondary | ICD-10-CM

## 2014-03-02 DIAGNOSIS — R81 Glycosuria: Secondary | ICD-10-CM

## 2014-03-02 DIAGNOSIS — R350 Frequency of micturition: Secondary | ICD-10-CM

## 2014-03-02 LAB — POCT URINALYSIS DIPSTICK
Blood, UA: NEGATIVE
Ketones, UA: NEGATIVE
Leukocytes, UA: NEGATIVE
NITRITE UA: POSITIVE
SPEC GRAV UA: 1.02
UROBILINOGEN UA: 1
pH, UA: 5

## 2014-03-02 MED ORDER — NITROFURANTOIN MONOHYD MACRO 100 MG PO CAPS: 100.0000 mg | ORAL_CAPSULE | Freq: Two times a day (BID) | ORAL | Status: DC

## 2014-03-02 MED ORDER — ALBUTEROL SULFATE HFA 108 (90 BASE) MCG/ACT IN AERS: 2.0000 | INHALATION_SPRAY | RESPIRATORY_TRACT | Status: DC | PRN

## 2014-03-02 NOTE — Progress Notes (Signed)
Pre visit review using our clinic review tool, if applicable. No additional management support is needed unless otherwise documented below in the visit note. 

## 2014-03-02 NOTE — Patient Instructions (Addendum)
Stay well hydrated. Drink to thirst up to 40 ounces of fluids daily. Please perform isometric exercises before going to bed. Sit on side of the bed and raise up on toes to a count of 5. Then put pressure on the heels to a count of 5. Repeat this process 10 times. This will improve blood flow to the calves & help prevent cramps.Mag Cal (magnesium plus calcium supplement) as muscle relaxant @ bedtime as needed

## 2014-03-02 NOTE — Progress Notes (Signed)
   Subjective:    Patient ID: Rebekah Kennedy, female    DOB: 04-16-1956, 58 y.o.   MRN: [id number]  HPI    Over the past 5-10 days she describes frequency and urgency. She's also had nocturia up to 4 times a night. She has bilateral flank discomfort.  She's treated this with hydration as well as cranberry juice;unfortunately the cranberry juice was sweetened   She is an insulin-dependent diabetic; she is not on any of the urine glucose clearing diabetic agents.   History is somewhat vague; but she states her average sugars fasting are 135. Her last A1c was 7.7% which is her best to date     Review of Systems  She denies pyuria, hematuria, or dysuria  She has no fever, chills, or sweats  She has been having some nocturnal leg cramps.       Objective:   Physical Exam  Positive pertinent findings include: Abdomen is massive without organomegaly or tenderness. There is no flank tenderness to percussion.  General appearance :adequately nourished; in no distress. Eyes: No conjunctival inflammation or scleral icterus is present. Heart:  Normal rate and regular rhythm. S1 and S2 normal without gallop, murmur, click, rub or other extra sounds   Lungs:Chest clear to auscultation; no wheezes, rhonchi,rales ,or rubs present.No increased work of breathing.  Abdomen: bowel sounds normal Vascular : all pulses equal ; no bruits present. Skin:Warm & dry.  Intact without suspicious lesions or rashes ; no jaundice or tenting Lymphatic: No lymphadenopathy is noted about the head, neck, axilla          Assessment & Plan:  #1 frequency of urination in the context of profound glucose urea  #2 leg cramps  Plan see orders and after visit summary

## 2014-03-03 LAB — URINE CULTURE

## 2014-03-12 ENCOUNTER — Other Ambulatory Visit: Payer: Self-pay

## 2014-03-12 MED ORDER — INSULIN PEN NEEDLE 31G X 8 MM MISC: Status: DC

## 2014-03-15 ENCOUNTER — Other Ambulatory Visit: Payer: Self-pay

## 2014-05-20 ENCOUNTER — Other Ambulatory Visit: Payer: Self-pay | Admitting: Neurology

## 2014-05-24 ENCOUNTER — Other Ambulatory Visit: Payer: Self-pay | Admitting: *Deleted

## 2014-05-24 MED ORDER — CLOPIDOGREL BISULFATE 75 MG PO TABS: 75.0000 mg | ORAL_TABLET | Freq: Every day | ORAL | Status: DC

## 2014-05-24 NOTE — Telephone Encounter (Signed)
Rx sent in

## 2014-06-21 ENCOUNTER — Ambulatory Visit (INDEPENDENT_AMBULATORY_CARE_PROVIDER_SITE_OTHER): Payer: BC Managed Care – PPO | Admitting: Internal Medicine

## 2014-06-21 ENCOUNTER — Other Ambulatory Visit (INDEPENDENT_AMBULATORY_CARE_PROVIDER_SITE_OTHER): Payer: BC Managed Care – PPO

## 2014-06-21 ENCOUNTER — Encounter: Payer: Self-pay | Admitting: Internal Medicine

## 2014-06-21 VITALS — BP 162/92 | HR 114 | Temp 98.1°F | Resp 16 | Ht 66.0 in | Wt 242.5 lb

## 2014-06-21 DIAGNOSIS — R000 Tachycardia, unspecified: Secondary | ICD-10-CM

## 2014-06-21 DIAGNOSIS — R109 Unspecified abdominal pain: Secondary | ICD-10-CM

## 2014-06-21 DIAGNOSIS — E1039 Type 1 diabetes mellitus with other diabetic ophthalmic complication: Secondary | ICD-10-CM

## 2014-06-21 DIAGNOSIS — R10A2 Flank pain, left side: Secondary | ICD-10-CM

## 2014-06-21 DIAGNOSIS — E1065 Type 1 diabetes mellitus with hyperglycemia: Secondary | ICD-10-CM

## 2014-06-21 DIAGNOSIS — IMO0002 Reserved for concepts with insufficient information to code with codable children: Secondary | ICD-10-CM

## 2014-06-21 DIAGNOSIS — I10 Essential (primary) hypertension: Secondary | ICD-10-CM

## 2014-06-21 DIAGNOSIS — R8290 Unspecified abnormal findings in urine: Secondary | ICD-10-CM

## 2014-06-21 LAB — CBC WITH DIFFERENTIAL/PLATELET
Basophils Absolute: 0 10*3/uL (ref 0.0–0.1)
Basophils Relative: 0.3 % (ref 0.0–3.0)
Eosinophils Absolute: 0.2 10*3/uL (ref 0.0–0.7)
Eosinophils Relative: 1.7 % (ref 0.0–5.0)
HCT: 41.8 % (ref 36.0–46.0)
Hemoglobin: 14 g/dL (ref 12.0–15.0)
LYMPHS ABS: 3.6 10*3/uL (ref 0.7–4.0)
Lymphocytes Relative: 33.6 % (ref 12.0–46.0)
MCHC: 33.5 g/dL (ref 30.0–36.0)
MCV: 79.7 fl (ref 78.0–100.0)
Monocytes Absolute: 0.8 10*3/uL (ref 0.1–1.0)
Monocytes Relative: 7.3 % (ref 3.0–12.0)
NEUTROS ABS: 6.1 10*3/uL (ref 1.4–7.7)
Neutrophils Relative %: 57.1 % (ref 43.0–77.0)
Platelets: 351 10*3/uL (ref 150.0–400.0)
RBC: 5.25 Mil/uL — AB (ref 3.87–5.11)
RDW: 13.7 % (ref 11.5–15.5)
WBC: 10.7 10*3/uL — AB (ref 4.0–10.5)

## 2014-06-21 LAB — BASIC METABOLIC PANEL
BUN: 19 mg/dL (ref 6–23)
CHLORIDE: 101 meq/L (ref 96–112)
CO2: 29 mEq/L (ref 19–32)
CREATININE: 0.83 mg/dL (ref 0.40–1.20)
Calcium: 9.5 mg/dL (ref 8.4–10.5)
GFR: 90.85 mL/min (ref >60.00)
Glucose, Bld: 289 mg/dL — ABNORMAL HIGH (ref 70–99)
Potassium: 4.1 mEq/L (ref 3.5–5.1)
Sodium: 136 mEq/L (ref 135–145)

## 2014-06-21 LAB — URINALYSIS
Bilirubin Urine: NEGATIVE
Hgb urine dipstick: NEGATIVE
Ketones, ur: NEGATIVE
LEUKOCYTES UA: NEGATIVE
Nitrite: NEGATIVE
Specific Gravity, Urine: 1.015 (ref 1.000–1.030)
Total Protein, Urine: NEGATIVE
UROBILINOGEN UA: 0.2 (ref 0.0–1.0)
Urine Glucose: >=1000 — AB
pH: 6 (ref 5.0–8.0)

## 2014-06-21 LAB — HEPATIC FUNCTION PANEL
ALT: 16 U/L (ref 0–35)
AST: 10 U/L (ref 0–37)
Albumin: 4 g/dL (ref 3.5–5.2)
Alkaline Phosphatase: 77 U/L (ref 39–117)
BILIRUBIN DIRECT: 0.1 mg/dL (ref 0.0–0.3)
Total Bilirubin: 0.5 mg/dL (ref 0.2–1.2)
Total Protein: 7.1 g/dL (ref 6.0–8.3)

## 2014-06-21 LAB — HEMOGLOBIN A1C: Hgb A1c MFr Bld: 9.9 % — ABNORMAL HIGH (ref 4.6–6.5)

## 2014-06-21 LAB — TSH: TSH: 1.79 u[IU]/mL (ref 0.35–4.50)

## 2014-06-21 LAB — MAGNESIUM: MAGNESIUM: 1.6 mg/dL (ref 1.5–2.5)

## 2014-06-21 MED ORDER — METOPROLOL SUCCINATE ER 100 MG PO TB24: 100.0000 mg | ORAL_TABLET | Freq: Every day | ORAL | Status: DC

## 2014-06-21 NOTE — Progress Notes (Signed)
Pre visit review using our clinic review tool, if applicable. No additional management support is needed unless otherwise documented below in the visit note. 

## 2014-06-21 NOTE — Progress Notes (Signed)
   Subjective:    Patient ID: Rebekah Kennedy, female    DOB: [birth year], 59 y.o.   MRN: [id number]  HPI  She describes a strong odor to her urine for last 2-3 months. She also has frequency of urination and nocturia. This is associated with urgency. She's had some left flank pain as well.  She has taken Aleve "a couple times a week up to 2 times per day". She's also had some discomfort in the upper left back.  She has not followed up with her Endocrinologist in the recent past.  She ran out of her Beta blocker in November  Review of Systems   Chest pain, palpitations, tachycardia (see VS), exertional dyspnea, paroxysmal nocturnal dyspnea, claudication or edema are absent.      Objective:   Physical Exam  Pertinent positive findings include:  As per CDC Guidelines ,Epic documents borderline severe obesity as being present . She exhibits a resting tach with heart rate of 115-120; pulse 111 on EKG. Breath sounds are decreased. Abdomen is massive. She has slight tenderness in the left flank.  General appearance :adequately nourished; in no distress. Eyes: No conjunctival inflammation or scleral icterus is present. Oral exam: Dental hygiene is good. Lips and gums are healthy appearing.There is no oropharyngeal erythema or exudate noted.  Heart:  Regular rhythm. S1 and S2 normal without gallop, murmur, click, rub or other extra sounds   Lungs:Chest clear to auscultation; no wheezes, rhonchi,rales ,or rubs present.No increased work of breathing.  Abdomen: bowel sounds normal, soft and non-tender without masses, organomegaly or hernias noted.  No guarding or rebound.  Vascular : all pulses equal ; no bruits present. Skin:Warm & dry.  Intact without suspicious lesions or rashes ; no  tenting Lymphatic: No lymphadenopathy is noted about the head, neck, axilla Neuro: Strength, tone & DTRs normal.            Assessment & Plan:  #1 malodorous urine  #2 frequency  #3  nocturia  #4 flank pain  #5 tachycardia. EKG reveals poor R wave progression across precordium.   #6 hypertension  #7 diabetes Oneta Rack status  Plan: See orders recommendations

## 2014-06-21 NOTE — Patient Instructions (Addendum)
Your next office appointment will be determined based upon review of your pending labs . Those instructions will be transmitted to you through My Chart  OR  by mail;whichever process is your choice to receive results & recommendations . Critical values will be called  Followup as needed for your acute issue. Please report any significant change in your symptoms.  To prevent rapid heart beats, avoid stimulants such as decongestants, diet pills, nicotine, or caffeine (coffee, tea, cola, or chocolate) to excess.Restart Metoprolol as soon as possible

## 2014-06-22 LAB — T3, FREE: T3, Free: 3.9 pg/mL (ref 2.3–4.2)

## 2014-06-22 LAB — T4, FREE: Free T4: 0.76 ng/dL (ref 0.60–1.60)

## 2014-06-22 LAB — URINE CULTURE: Colony Count: 50000

## 2014-06-23 ENCOUNTER — Other Ambulatory Visit: Payer: Self-pay | Admitting: Internal Medicine

## 2014-06-23 MED ORDER — FLUCONAZOLE 150 MG PO TABS: 150.0000 mg | ORAL_TABLET | Freq: Once | ORAL | Status: DC

## 2014-06-23 NOTE — Progress Notes (Signed)
Left message for patient to return call   Patient returned call and has been advised no definite UTI per Dr Rebekah Kennedy he did send a Diflucan tablet to pharmacy  Per lab note

## 2014-06-28 ENCOUNTER — Ambulatory Visit: Payer: BC Managed Care – PPO | Admitting: Endocrinology

## 2014-07-06 ENCOUNTER — Encounter: Payer: BC Managed Care – PPO | Admitting: Gynecology

## 2014-07-09 ENCOUNTER — Ambulatory Visit (INDEPENDENT_AMBULATORY_CARE_PROVIDER_SITE_OTHER): Payer: BC Managed Care – PPO | Admitting: Endocrinology

## 2014-07-09 ENCOUNTER — Encounter: Payer: Self-pay | Admitting: Endocrinology

## 2014-07-09 VITALS — BP 146/92 | HR 115 | Temp 98.4°F | Ht 66.0 in | Wt 241.0 lb

## 2014-07-09 DIAGNOSIS — IMO0002 Reserved for concepts with insufficient information to code with codable children: Secondary | ICD-10-CM

## 2014-07-09 DIAGNOSIS — E1039 Type 1 diabetes mellitus with other diabetic ophthalmic complication: Secondary | ICD-10-CM

## 2014-07-09 DIAGNOSIS — E1065 Type 1 diabetes mellitus with hyperglycemia: Principal | ICD-10-CM

## 2014-07-09 MED ORDER — INSULIN ASPART 100 UNIT/ML FLEXPEN: 40.0000 [IU] | PEN_INJECTOR | Freq: Every day | SUBCUTANEOUS | Status: DC

## 2014-07-09 MED ORDER — INSULIN PEN NEEDLE 29G X 12MM MISC: 1.0000 | Freq: Two times a day (BID) | Status: DC

## 2014-07-09 NOTE — Progress Notes (Signed)
Subjective:    Patient ID: Rebekah Kennedy, female    DOB: Oct 04, 1955, 59 y.o.   MRN: [id number]  HPI  Pt returns for f/u of diabetes mellitus: DM type: Insulin-requiring type 2 Dx'ed: 4098 Complications: CVA, nephropathy, and retinopathy. Therapy: insulin since 2013 GDM: never DKA: never Severe hypoglycemia: never. Pancreatitis: never Other: due to noncompliance with insulin injections and cbg monitoring, she is on a simple qd insulin regimen Interval history: pt says she misses the insulin approx once every 2 weeks.  no cbg record, but states cbg's vary from 98-300's.  It is in general higher as the day goes on.   Past Medical History  Diagnosis Date  . DIABETES MELLITUS, TYPE II 12/01/2006  . HYPERLIPIDEMIA 12/01/2006  . HYPERTENSION 07/07/2006  . DEPRESSION 03/16/2007  . CHEST PAIN 01/23/2007  . ASTHMA NOS W/ACUTE EXACERBATION 07/10/2010  . Obesity   . Splenomegaly     in college  . Dysmenorrhea   . Fibroid   . TIA (transient ischemic attack) 2012  . 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 Cervical dysplasia   . Cataract     Past Surgical History  Procedure Laterality Date  . Appendectomy    . Myomectomy    . Ovarian cyst removal    . 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 Breast surgery      Reduction  . Pelvic laparoscopy  75,88    DL lysis of adhesions  . Dilation and curettage of uterus  1975    DUB  . Biopsy thyroid  05/02/11    Nonneoplastic goiter  . Gynecologic cryosurgery    . Colposcopy    . Eye surgery      History   Social History  . Marital Status: Single    Spouse Name: N/A  . Number of Children: 2  . Years of Education: N/A   Occupational History  . TEACHER    Social History Main Topics  . Smoking status: Never Smoker   . Smokeless tobacco: Never Used  . Alcohol Use: No     Comment: very rare  . Drug Use: No  . Sexual Activity: Not Currently    Birth Control/ Protection: Post-menopausal   Other Topics Concern    . 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 albuterol (PROVENTIL HFA;VENTOLIN HFA) 108 (90 BASE) MCG/ACT inhaler Inhale 2 puffs into the lungs every 4 (four) hours as needed for wheezing or shortness of breath. 18 g 2  . amLODipine (NORVASC) 10 MG tablet Take 10 mg by mouth daily.    Marland Kitchen atorvastatin (LIPITOR) 40 MG tablet Take 40 mg by mouth at bedtime.    . budesonide-formoterol (SYMBICORT) 160-4.5 MCG/ACT inhaler Inhale 2 puffs into the lungs 2 (two) times daily.    . clopidogrel (PLAVIX) 75 MG tablet Take 1 tablet (75 mg total) by mouth daily. 30 tablet 0  . fluconazole (DIFLUCAN) 150 MG tablet Take 1 tablet (150 mg total) by mouth once. 1 tablet 0  . ibuprofen (ADVIL,MOTRIN) 400 MG tablet Take 1 tablet (400 mg total) by mouth every 6 (six) hours as needed. 30 tablet 0  . insulin glargine (LANTUS) 100 UNIT/ML injection Inject 300 Units into the skin  daily.     . losartan (COZAAR) 100 MG tablet Take 100 mg by mouth daily.    . metoprolol succinate (TOPROL-XL) 100 MG 24 hr tablet Take 1 tablet (100 mg total) by mouth daily. Take with or immediately following a meal. 90 tablet 1  . montelukast (SINGULAIR) 10 MG tablet Take 10 mg by mouth at bedtime.    . Multiple Vitamin (MULTIVITAMIN) tablet Take 1 tablet by mouth daily.      Marland Kitchen spironolactone (ALDACTONE) 25 MG tablet Take 25 mg by mouth 2 (two) times daily.    . traMADol (ULTRAM) 50 MG tablet Take 1 tablet (50 mg total) by mouth every 6 (six) hours as needed. 10 tablet 0   No current facility-administered medications on file prior to visit.    Allergies  Allergen Reactions  . Quinapril     cough    Family History  Problem Relation Age of Onset  . Cancer Maternal Grandmother     Colon Cancer  . Asthma Maternal Grandmother   . Diabetes Father    . Heart disease Father   . Hypertension Father   . Hyperlipidemia Father   . Hypertension Mother   . Heart disease Mother   . Ovarian cancer Mother   . Cancer Mother     Lung cancer  . Asthma Mother   . COPD Mother   . Hyperlipidemia Mother   . Diabetes Brother   . Hypertension Brother   . Kidney disease Brother   . Asthma Brother   . Heart disease Brother   . Hyperlipidemia Brother   . Graves' disease Sister   . Diabetes Sister   . Graves' disease Paternal Grandmother   . Hypertension Paternal Grandmother   . Heart disease Paternal Grandmother   . Alzheimer's disease Paternal Grandmother   . Cancer Maternal Grandfather     BP 146/92 mmHg  Pulse 115  Temp(Src) 98.4 F (36.9 C) (Oral)  Ht 5\' 6"  (1.676 m)  Wt 241 lb (109.317 kg)  BMI 38.92 kg/m2  SpO2 98%  Review of Systems She denies hypoglycemia and weight change.      Objective:   Physical Exam VITAL SIGNS:  See vs page GENERAL: no distress Pulses: dorsalis pedis intact bilat.   MSK: no deformity of the feet CV: trace bilat leg edema Skin:  no ulcer on the feet.  normal color and temp on the feet. Neuro: sensation is intact to touch on the feet  Lab Results  Component Value Date   HGBA1C 9.9* 06/21/2014       Assessment & Plan:  DM: moderate exacerbation Noncompliance with cbg recording: I'll work around this as best I can, but it appears that based on the pattern of her cbg's, she needs some adjustment in her therapy. Obesity: persisitent   Patient is advised the following: Patient Instructions  On this type of insulin schedule, you should eat meals on a regular schedule.  If a meal is missed or significantly delayed, your blood sugar could go low. check your blood sugar 2 times a day.  vary the time of day when you check, between before the 3 meals, and at bedtime.  also check if you have symptoms of your blood sugar being too high or too low.  please keep a record of the readings and bring it to  your next appointment here.  please call us sooner if you are having low blood sugar episodes, or if it stays over 200.   Please come  back for a follow-up appointment in 2 months.  Please continue to pursue the weight-loss surgery, and let me know what I need to do to help you with this.   Please decrease the lantus to 300 units each morning, and:  Add novolog, 40 units with the evening meal.

## 2014-07-09 NOTE — Patient Instructions (Addendum)
On this type of insulin schedule, you should eat meals on a regular schedule.  If a meal is missed or significantly delayed, your blood sugar could go low. check your blood sugar 2 times a day.  vary the time of day when you check, between before the 3 meals, and at bedtime.  also check if you have symptoms of your blood sugar being too high or too low.  please keep a record of the readings and bring it to your next appointment here.  please call us sooner if you are having low blood sugar episodes, or if it stays over 200.   Please come back for a follow-up appointment in 2 months.  Please continue to pursue the weight-loss surgery, and let me know what I need to do to help you with this.   Please decrease the lantus to 300 units each morning, and:  Add novolog, 40 units with the evening meal.

## 2014-08-24 ENCOUNTER — Encounter: Payer: BC Managed Care – PPO | Admitting: Gynecology

## 2014-09-07 ENCOUNTER — Ambulatory Visit: Payer: BC Managed Care – PPO | Admitting: Endocrinology

## 2014-10-27 ENCOUNTER — Other Ambulatory Visit: Payer: Self-pay | Admitting: Endocrinology

## 2014-10-27 ENCOUNTER — Telehealth: Payer: Self-pay | Admitting: Neurology

## 2014-10-27 NOTE — Telephone Encounter (Signed)
Refill refused.  Patient needs an appointment.

## 2014-10-28 ENCOUNTER — Telehealth: Payer: Self-pay | Admitting: Endocrinology

## 2014-10-28 MED ORDER — INSULIN GLARGINE 100 UNIT/ML ~~LOC~~ SOLN: 300.0000 [IU] | Freq: Every day | SUBCUTANEOUS | Status: DC

## 2014-10-28 NOTE — Telephone Encounter (Signed)
Pt called in regards to having a prescription refilled for blood thinners/Dawn  CB# [phone]

## 2014-10-28 NOTE — Telephone Encounter (Signed)
Refill sent to pt's pharmacy. 

## 2014-10-28 NOTE — Telephone Encounter (Signed)
Pt needs lantus rx called [facility] appt made for 11/25/14

## 2014-10-29 ENCOUNTER — Telehealth: Payer: Self-pay | Admitting: Endocrinology

## 2014-10-29 ENCOUNTER — Other Ambulatory Visit: Payer: Self-pay | Admitting: *Deleted

## 2014-10-29 MED ORDER — INSULIN GLARGINE 100 UNIT/ML SOLOSTAR PEN: 300.0000 [IU] | PEN_INJECTOR | Freq: Every day | SUBCUTANEOUS | Status: DC

## 2014-10-29 MED ORDER — CLOPIDOGREL BISULFATE 75 MG PO TABS: 75.0000 mg | ORAL_TABLET | Freq: Every day | ORAL | Status: DC

## 2014-10-29 NOTE — Telephone Encounter (Signed)
[facility] calling regarding lantus  Please resend for the solostar pens thank you  [phone]

## 2014-10-29 NOTE — Telephone Encounter (Signed)
rx sent to pharmacy by e-script for solostar pen

## 2014-10-29 NOTE — Telephone Encounter (Signed)
I called patient back and she is scheduled for an appt next week.  #30 pills with no refills.

## 2014-11-04 ENCOUNTER — Ambulatory Visit (INDEPENDENT_AMBULATORY_CARE_PROVIDER_SITE_OTHER): Payer: BC Managed Care – PPO | Admitting: Neurology

## 2014-11-04 ENCOUNTER — Encounter: Payer: Self-pay | Admitting: Neurology

## 2014-11-04 VITALS — BP 130/80 | HR 91 | Ht 66.0 in | Wt 248.4 lb

## 2014-11-04 DIAGNOSIS — G463 Brain stem stroke syndrome: Secondary | ICD-10-CM

## 2014-11-04 DIAGNOSIS — E785 Hyperlipidemia, unspecified: Secondary | ICD-10-CM

## 2014-11-04 DIAGNOSIS — Z79899 Other long term (current) drug therapy: Secondary | ICD-10-CM | POA: Diagnosis not present

## 2014-11-04 MED ORDER — CLOPIDOGREL BISULFATE 75 MG PO TABS: 75.0000 mg | ORAL_TABLET | Freq: Every day | ORAL | Status: DC

## 2014-11-04 MED ORDER — PRAVASTATIN SODIUM 20 MG PO TABS: 20.0000 mg | ORAL_TABLET | Freq: Every day | ORAL | Status: DC

## 2014-11-04 NOTE — Progress Notes (Signed)
Follow-up Visit   Date: 11/04/2014    Rebekah Kennedy MRN: [id number] DOB: Mar 04, 1956   Interim History: Rebekah Kennedy is a 59 y.o. right-handed African American Rebekah with history of uncontrolled diabetes mellitus (dx 2008, HbA1c 11.8), hyperlipidemia, hypertension, depression, and R pontine stroke (2012, mild left residual weakness) returning to the clinic for follow-up of medication management for stroke.  The patient was accompanied to the clinic by self.   History of present illness: About early September 2014, she notice stabbing pain over the left medial upper thigh, lasting 30 seconds. It spontaneously resolved. A week later, she had the same symptoms over the right leg and abdomen. It started occuring about once per day, but now occuring 1-3 x per day. The areas are localized to a quarter size area over the left inner upper thigh/groin, right anterior thigh, and bilateral lower anterior chest. There is no radiation or shooting quality to her symptoms. She denies any triggers. No exacerbating or alleviating factors. Denies any weakness or numbness/tingling of the feet. No saddle anesthesia or incontinence.   UPDATE 11/04/2014:  She was last seen in the office on 03/24/2013 for abnormal sensation of her legs, which improved once she stopped taking statin therapy. She is not taking anything for cholesterol at this time nor engaged in an active weight loss program.  She has no new complaints and is here for refills on her plavix.    Medications:  Current Outpatient Prescriptions on File Prior to Visit  Medication Sig Dispense Refill  . albuterol (PROVENTIL HFA;VENTOLIN HFA) 108 (90 BASE) MCG/ACT inhaler Inhale 2 puffs into the lungs every 4 (four) hours as needed for wheezing or shortness of breath. 18 g 2  . amLODipine (NORVASC) 10 MG tablet Take 10 mg by mouth daily.    . budesonide-formoterol (SYMBICORT) 160-4.5 MCG/ACT inhaler Inhale 2 puffs into the lungs 2 (two) times  daily.    . fluconazole (DIFLUCAN) 150 MG tablet Take 1 tablet (150 mg total) by mouth once. 1 tablet 0  . ibuprofen (ADVIL,MOTRIN) 400 MG tablet Take 1 tablet (400 mg total) by mouth every 6 (six) hours as needed. 30 tablet 0  . insulin aspart (NOVOLOG FLEXPEN) 100 UNIT/ML FlexPen Inject 40 Units into the skin daily with supper. 15 mL 11  . Insulin Glargine (LANTUS SOLOSTAR) 100 UNIT/ML Solostar Pen Inject 300 Units into the skin daily at 10 pm. 30 pen 1  . Insulin Pen Needle 29G X 12MM MISC 1 Device by Does not apply route 2 (two) times daily. 100 each 11  . losartan (COZAAR) 100 MG tablet Take 100 mg by mouth daily.    . metoprolol succinate (TOPROL-XL) 100 MG 24 hr tablet Take 1 tablet (100 mg total) by mouth daily. Take with or immediately following a meal. 90 tablet 1  . montelukast (SINGULAIR) 10 MG tablet Take 10 mg by mouth at bedtime.    . Multiple Vitamin (MULTIVITAMIN) tablet Take 1 tablet by mouth daily.      Marland Kitchen spironolactone (ALDACTONE) 25 MG tablet Take 25 mg by mouth 2 (two) times daily.    . traMADol (ULTRAM) 50 MG tablet Take 1 tablet (50 mg total) by mouth every 6 (six) hours as needed. 10 tablet 0   No current facility-administered medications on file prior to visit.    Allergies:  Allergies  Allergen Reactions  . Quinapril     cough    Review of Systems:  CONSTITUTIONAL: No fevers, chills, night sweats, or  weight loss.  EYES: No visual changes or eye pain ENT: No hearing changes.  No history of nose bleeds.   RESPIRATORY: No cough, wheezing and shortness of breath.   CARDIOVASCULAR: Negative for chest pain, and palpitations.   GI: Negative for abdominal discomfort, blood in stools or black stools.  No recent change in bowel habits.   GU:  No history of incontinence.   MUSCLOSKELETAL: No history of joint pain or swelling.  No myalgias.   SKIN: Negative for lesions, rash, and itching.   ENDOCRINE: Negative for cold or heat intolerance, polydipsia or goiter.     PSYCH:  No depression or anxiety symptoms.   NEURO: As Above.   Vital Signs:  BP 130/80 mmHg  Pulse 91  Ht 5\' 6"  (1.676 m)  Wt 248 lb 7 oz (112.691 kg)  BMI 40.12 kg/m2  SpO2 94%  Neurological Exam: MENTAL STATUS including orientation to time, place, person, recent and remote memory, attention span and concentration, language, and fund of knowledge is normal.  Speech is not dysarthric.  CRANIAL NERVES: No visual field defects.  Pupils equal round and reactive to light.  Normal conjugate, extra-ocular eye movements in all directions of gaze.  No ptosis. Normal facial sensation.  Face is symmetric. Palate elevates symmetrically.  Tongue is midline.  MOTOR:  Motor strength is 5/5 in all extremities.  No atrophy, fasciculations or abnormal movements.  No pronator drift.  Tone is normal.    MSRs:  Right                                                                 Left brachioradialis 2+  brachioradialis 2+  biceps 2+  biceps 2+  triceps 2+  triceps 2+  patellar 2+  patellar 2+  ankle jerk 1+  ankle jerk 1+  Hoffman no  Hoffman no  plantar response down  plantar response down   SENSORY:  Intact to vibration.  COORDINATION/GAIT:  Normal finger-to- nose-finger and heel-to-shin.  Intact rapid alternating movements bilaterally.  Gait narrow based and stable.   Data: Lab Results  Component Value Date   CHOL 205* 01/04/2014   HDL 50.00 01/04/2014   LDLCALC 122* 01/04/2014   LDLDIRECT 126.7 03/24/2013   TRIG 167.0* 01/04/2014   CHOLHDL 4 01/04/2014   Lab Results  Component Value Date   HGBA1C 9.9* 06/21/2014   MRI brain 04/2011: 1. Acute lacunar type infarcts in the right mid brain and pons. No mass effect or hemorrhage.  2. Superimposed chronic lacunar infarct in the right paracentral pons. These findings indicate acute on chronic small vessel ischemia.  3. Otherwise mild for age nonspecific cerebral white matter signal changes.  4. Intracranial MRA findings are  below.  MRA brain 04/2011: 1. 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 No major branch occlusion. There is irregularity suggesting atherosclerosis in the right proximal PCA.  2. Mild anterior circulation atherosclerosis. No stenosis or major branch occlusion.   IMPRESSION/PLAN: Rebekah Kennedy is a 59 year old Rebekah with history of right pontine stroke (2012, mild left hemiparesis, small vessel disease) presenting for follow-up.  She was last seen in November 2014 for disturbance of skin sensation which improved after she stopped lipitor and is here for refills on her plavix.  Her exam today  is non-focal and she has no new complaints.   She is currently not on any medication for her cholesterol and not following a diet exercise regimen.   Because of her vascular risk factors (diabetes HbA1c 9.9, previous stroke, hypertension, hyperlipidemia - LDL 122), aggressive risk factor control is strongly recommended.  Pravastatin may have lower incidence of myalgias, so I will start her on pravastain 20mg  daily, which will need to be increased if she is tolerating it.  Alternative options for cholesterol management should be discussed with her PCP.  Continue plavix 75mg  daily - refills provided for one year.  She is welcome to see me back in 1 year, or can request her PCP refill this at her annual wellness visits.  Strongly recommended starting exercise program for weight loss.   Return to clinic as needed   The duration of this appointment visit was 25 minutes of face-to-face time with the patient.  Greater than 50% of this time was spent in counseling, explanation of diagnosis, planning of further management, and coordination of care.   Thank you for allowing me to participate in patient's care.  If I can answer any additional questions, I would be pleased to do so.    Sincerely,    Aline Wesche K. Posey Pronto, DO

## 2014-11-04 NOTE — Patient Instructions (Addendum)
1.  Continue plaxix 75mg  daily - refills sent 2.  Start pravastatin 20mg  daily, this may need to be increased if you are tolerating the medication as your cholesterol levels are still elevated.   Please follow-up with your primary care provider to have your cholesterol checked in 3 months 3.  Encouraged to stay active and start exercise program 4.  Return to clinic as needed

## 2014-11-15 ENCOUNTER — Other Ambulatory Visit: Payer: Self-pay

## 2014-11-25 ENCOUNTER — Ambulatory Visit (INDEPENDENT_AMBULATORY_CARE_PROVIDER_SITE_OTHER): Payer: BC Managed Care – PPO | Admitting: Endocrinology

## 2014-11-25 ENCOUNTER — Encounter: Payer: Self-pay | Admitting: Endocrinology

## 2014-11-25 VITALS — BP 130/80 | HR 106 | Temp 98.5°F | Resp 18 | Ht 66.0 in | Wt 242.0 lb

## 2014-11-25 DIAGNOSIS — R809 Proteinuria, unspecified: Secondary | ICD-10-CM

## 2014-11-25 DIAGNOSIS — IMO0002 Reserved for concepts with insufficient information to code with codable children: Secondary | ICD-10-CM

## 2014-11-25 DIAGNOSIS — E1065 Type 1 diabetes mellitus with hyperglycemia: Secondary | ICD-10-CM

## 2014-11-25 DIAGNOSIS — E1039 Type 1 diabetes mellitus with other diabetic ophthalmic complication: Secondary | ICD-10-CM

## 2014-11-25 LAB — POCT GLYCOSYLATED HEMOGLOBIN (HGB A1C): HEMOGLOBIN A1C: 9.5

## 2014-11-25 MED ORDER — INSULIN GLARGINE 100 UNIT/ML SOLOSTAR PEN: 400.0000 [IU] | PEN_INJECTOR | SUBCUTANEOUS | Status: DC

## 2014-11-25 NOTE — Progress Notes (Signed)
Subjective:    Patient ID: Rebekah Kennedy, female    DOB: [birth year], 59 y.o.   MRN: [id number]  HPI Pt returns for f/u of diabetes mellitus: DM type: Insulin-requiring type 2 Dx'ed: 1572 Complications: CVA, nephropathy, and retinopathy. Therapy: insulin since 2013 GDM: never DKA: never Severe hypoglycemia: never. Pancreatitis: never Other: due to noncompliance with insulin injections and cbg monitoring, she is on a simple qd insulin regimen Interval history: Pt says she does not take the novolog.  She takes lantus 300 units qam.  She seldom checks cbg's.  She says it is in the 300's when she checks. Past Medical History  Diagnosis Date  . DIABETES MELLITUS, TYPE II 12/01/2006  . HYPERLIPIDEMIA 12/01/2006  . HYPERTENSION 07/07/2006  . DEPRESSION 03/16/2007  . CHEST PAIN 01/23/2007  . ASTHMA NOS W/ACUTE EXACERBATION 07/10/2010  . Obesity   . Splenomegaly     in college  . Dysmenorrhea   . Fibroid   . TIA (transient ischemic attack) 2012  . 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 Cervical dysplasia   . Cataract     Past Surgical History  Procedure Laterality Date  . Appendectomy    . Myomectomy    . Ovarian cyst removal    . 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 Breast surgery      Reduction  . Pelvic laparoscopy  75,88    DL lysis of adhesions  . Dilation and curettage of uterus  1975    DUB  . Biopsy thyroid  05/02/11    Nonneoplastic goiter  . Gynecologic cryosurgery    . Colposcopy    . Eye surgery      History   Social History  . Marital Status: Single    Spouse Name: N/A  . Number of Children: 2  . Years of Education: N/A   Occupational History  . TEACHER    Social History Main Topics  . Smoking status: Never Smoker   . Smokeless tobacco: Never Used  . Alcohol Use: No     Comment: very rare  . Drug Use: No  . Sexual Activity: Not Currently    Birth Control/ Protection: Post-menopausal   Other Topics Concern  . 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 albuterol (PROVENTIL HFA;VENTOLIN HFA) 108 (90 BASE) MCG/ACT inhaler Inhale 2 puffs into the lungs every 4 (four) hours as needed for wheezing or shortness of breath. 18 g 2  . amLODipine (NORVASC) 10 MG tablet Take 10 mg by mouth daily.    . budesonide-formoterol (SYMBICORT) 160-4.5 MCG/ACT inhaler Inhale 2 puffs into the lungs 2 (two) times daily.    . clopidogrel (PLAVIX) 75 MG tablet Take 1 tablet (75 mg total) by mouth daily. 90 tablet 3  . ibuprofen (ADVIL,MOTRIN) 400 MG tablet Take 1 tablet (400 mg total) by mouth every 6 (six) hours as needed. 30 tablet 0  . Insulin Pen Needle 29G X 12MM MISC 1 Device by Does not apply route 2 (two) times daily. 100 each 11  . losartan (COZAAR) 100 MG tablet Take 100 mg by mouth daily.    . metoprolol succinate (TOPROL-XL) 100 MG 24 hr tablet Take 1 tablet (100 mg  total) by mouth daily. Take with or immediately following a meal. 90 tablet 1  . montelukast (SINGULAIR) 10 MG tablet Take 10 mg by mouth at bedtime.    . Multiple Vitamin (MULTIVITAMIN) tablet Take 1 tablet by mouth daily.      . pravastatin (PRAVACHOL) 20 MG tablet Take 1 tablet (20 mg total) by mouth daily. 30 tablet 5  . spironolactone (ALDACTONE) 25 MG tablet Take 25 mg by mouth 2 (two) times daily.    . traMADol (ULTRAM) 50 MG tablet Take 1 tablet (50 mg total) by mouth every 6 (six) hours as needed. 10 tablet 0  . fluconazole (DIFLUCAN) 150 MG tablet Take 1 tablet (150 mg total) by mouth once. (Patient not taking: Reported on 11/25/2014) 1 tablet 0   No current facility-administered medications on file prior to visit.    Allergies  Allergen Reactions  . Quinapril     cough    Family History  Problem Relation Age of Onset  . Cancer Maternal Grandmother     Colon  Cancer  . Asthma Maternal Grandmother   . Diabetes Father   . Heart disease Father   . Hypertension Father   . Hyperlipidemia Father   . Hypertension Mother   . Heart disease Mother   . Ovarian cancer Mother   . Cancer Mother     Lung cancer  . Asthma Mother   . COPD Mother   . Hyperlipidemia Mother   . Diabetes Brother   . Hypertension Brother   . Kidney disease Brother   . Asthma Brother   . Heart disease Brother   . Hyperlipidemia Brother   . Graves' disease Sister   . Diabetes Sister   . Graves' disease Paternal Grandmother   . Hypertension Paternal Grandmother   . Heart disease Paternal Grandmother   . Alzheimer's disease Paternal Grandmother   . Cancer Maternal Grandfather     BP 130/80 mmHg  Pulse 106  Temp(Src) 98.5 F (36.9 C) (Oral)  Resp 18  Ht 5\' 6"  (1.676 m)  Wt 242 lb (109.77 kg)  BMI 39.08 kg/m2  SpO2 96%  Review of Systems She denies hypoglycemia.    Objective:   Physical Exam VITAL SIGNS:  See vs page GENERAL: no distress Pulses: dorsalis pedis intact bilat.   MSK: no deformity of the feet  CV: no leg edema  Skin: no ulcer on the feet. normal color and temp on the feet.  Neuro: sensation is intact to touch on the feet    A1c=9.6%    Assessment & Plan:  DM: poor control Noncompliance with cbg recording and insulin: I'll work around this as best I can  Patient is advised the following: Patient Instructions  On this type of insulin schedule, you should eat meals on a regular schedule.  If a meal is missed or significantly delayed, your blood sugar could go low. check your blood sugar 2 times a day.  vary the time of day when you check, between before the 3 meals, and at bedtime.  also check if you have symptoms of your blood sugar being too high or too low.  please keep a record of the readings and bring it to your next appointment here.  please call us sooner if you are having low blood sugar episodes, or if it stays over 200.   Please  come back for a follow-up appointment in 3 months.  Please continue to pursue the weight-loss surgery, and let me know what I  need to do to help you with this.   Please increase the lantus to 400 units each morning, rather than taking the novolog.

## 2014-11-25 NOTE — Patient Instructions (Addendum)
On this type of insulin schedule, you should eat meals on a regular schedule.  If a meal is missed or significantly delayed, your blood sugar could go low. check your blood sugar 2 times a day.  vary the time of day when you check, between before the 3 meals, and at bedtime.  also check if you have symptoms of your blood sugar being too high or too low.  please keep a record of the readings and bring it to your next appointment here.  please call us sooner if you are having low blood sugar episodes, or if it stays over 200.   Please come back for a follow-up appointment in 3 months.  Please continue to pursue the weight-loss surgery, and let me know what I need to do to help you with this.   Please increase the lantus to 400 units each morning, rather than taking the novolog.

## 2014-12-01 ENCOUNTER — Encounter: Payer: Self-pay | Admitting: Internal Medicine

## 2014-12-01 ENCOUNTER — Ambulatory Visit (INDEPENDENT_AMBULATORY_CARE_PROVIDER_SITE_OTHER): Payer: BC Managed Care – PPO | Admitting: Internal Medicine

## 2014-12-01 ENCOUNTER — Other Ambulatory Visit (INDEPENDENT_AMBULATORY_CARE_PROVIDER_SITE_OTHER): Payer: BC Managed Care – PPO

## 2014-12-01 VITALS — BP 144/86 | HR 96 | Temp 98.3°F | Resp 16 | Wt 238.0 lb

## 2014-12-01 DIAGNOSIS — Z860100 Personal history of colon polyps, unspecified: Secondary | ICD-10-CM

## 2014-12-01 DIAGNOSIS — R194 Change in bowel habit: Secondary | ICD-10-CM

## 2014-12-01 DIAGNOSIS — R509 Fever, unspecified: Secondary | ICD-10-CM

## 2014-12-01 DIAGNOSIS — R198 Other specified symptoms and signs involving the digestive system and abdomen: Secondary | ICD-10-CM

## 2014-12-01 LAB — CBC WITH DIFFERENTIAL/PLATELET
Basophils Absolute: 0.1 10*3/uL (ref 0.0–0.1)
Basophils Relative: 0.7 % (ref 0.0–3.0)
EOS ABS: 1.1 10*3/uL — AB (ref 0.0–0.7)
EOS PCT: 8.8 % — AB (ref 0.0–5.0)
HCT: 41.2 % (ref 36.0–46.0)
Hemoglobin: 13.7 g/dL (ref 12.0–15.0)
LYMPHS PCT: 27.6 % (ref 12.0–46.0)
Lymphs Abs: 3.5 10*3/uL (ref 0.7–4.0)
MCHC: 33.2 g/dL (ref 30.0–36.0)
MCV: 81.2 fl (ref 78.0–100.0)
Monocytes Absolute: 0.9 10*3/uL (ref 0.1–1.0)
Monocytes Relative: 6.9 % (ref 3.0–12.0)
Neutro Abs: 7 10*3/uL (ref 1.4–7.7)
Neutrophils Relative %: 56 % (ref 43.0–77.0)
Platelets: 491 10*3/uL — ABNORMAL HIGH (ref 150.0–400.0)
RBC: 5.07 Mil/uL (ref 3.87–5.11)
RDW: 14.3 % (ref 11.5–15.5)
WBC: 12.5 10*3/uL — ABNORMAL HIGH (ref 4.0–10.5)

## 2014-12-01 NOTE — Assessment & Plan Note (Signed)
GI referral

## 2014-12-01 NOTE — Patient Instructions (Signed)
Please take a probiotic , Florastor OR Align, every day if the bowels are loose. This will replace the normal bacteria which  are necessary for formation of normal stool and processing of food. Immodium AD as needed for frank diarrhea    If diarrhea persists, stool studies would be indicated to check  for various bacterial infections  and ova & parasites.

## 2014-12-01 NOTE — Progress Notes (Signed)
Pre visit review using our clinic review tool, if applicable. No additional management support is needed unless otherwise documented below in the visit note. 
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
## 2014-12-01 NOTE — Assessment & Plan Note (Signed)
Probiotic CBC & dif GI referral

## 2015-01-29 ENCOUNTER — Encounter: Payer: Self-pay | Admitting: Internal Medicine

## 2015-01-29 ENCOUNTER — Ambulatory Visit (INDEPENDENT_AMBULATORY_CARE_PROVIDER_SITE_OTHER): Payer: BC Managed Care – PPO | Admitting: Internal Medicine

## 2015-01-29 VITALS — BP 142/80 | HR 95 | Temp 98.0°F | Ht 66.0 in | Wt 244.0 lb

## 2015-01-29 DIAGNOSIS — L249 Irritant contact dermatitis, unspecified cause: Secondary | ICD-10-CM | POA: Diagnosis not present

## 2015-01-29 MED ORDER — PERMETHRIN 5 % EX CREA: 1.0000 "application " | TOPICAL_CREAM | Freq: Once | CUTANEOUS | Status: DC

## 2015-01-29 NOTE — Progress Notes (Signed)
Subjective:    Patient ID: Rebekah Kennedy, female    DOB: 06-16-1955, 59 y.o.   MRN: [id number]  HPI Here due to rash  Has been breaking out  Was out of town in [place] in hotel--about a month ago Itching started about 2 weeks ago  Started on arms Now sees a few on legs Very itchy Get puffy--like "hives" but no coming and going Scratches and leaves marks  Noone else in household having problems  Current Outpatient Prescriptions on File Prior to Visit  Medication Sig Dispense Refill  . albuterol (PROVENTIL HFA;VENTOLIN HFA) 108 (90 BASE) MCG/ACT inhaler Inhale 2 puffs into the lungs every 4 (four) hours as needed for wheezing or shortness of breath. 18 g 2  . amLODipine (NORVASC) 10 MG tablet Take 10 mg by mouth daily.    . budesonide-formoterol (SYMBICORT) 160-4.5 MCG/ACT inhaler Inhale 2 puffs into the lungs 2 (two) times daily.    . clopidogrel (PLAVIX) 75 MG tablet Take 1 tablet (75 mg total) by mouth daily. 90 tablet 3  . fluconazole (DIFLUCAN) 150 MG tablet Take 1 tablet (150 mg total) by mouth once. 1 tablet 0  . ibuprofen (ADVIL,MOTRIN) 400 MG tablet Take 1 tablet (400 mg total) by mouth every 6 (six) hours as needed. 30 tablet 0  . Insulin Glargine (LANTUS SOLOSTAR) 100 UNIT/ML Solostar Pen Inject 400 Units into the skin every morning. 50 pen 11  . Insulin Pen Needle 29G X 12MM MISC 1 Device by Does not apply route 2 (two) times daily. 100 each 11  . losartan (COZAAR) 100 MG tablet Take 100 mg by mouth daily.    . metoprolol succinate (TOPROL-XL) 100 MG 24 hr tablet Take 1 tablet (100 mg total) by mouth daily. Take with or immediately following a meal. 90 tablet 1  . montelukast (SINGULAIR) 10 MG tablet Take 10 mg by mouth at bedtime.    . Multiple Vitamin (MULTIVITAMIN) tablet Take 1 tablet by mouth daily.      . pravastatin (PRAVACHOL) 20 MG tablet Take 1 tablet (20 mg total) by mouth daily. 30 tablet 5  . spironolactone (ALDACTONE) 25 MG tablet Take 25 mg by mouth 2  (two) times daily.    . traMADol (ULTRAM) 50 MG tablet Take 1 tablet (50 mg total) by mouth every 6 (six) hours as needed. 10 tablet 0   No current facility-administered medications on file prior to visit.    Allergies  Allergen Reactions  . Quinapril     cough    Past Medical History  Diagnosis Date  . DIABETES MELLITUS, TYPE II 12/01/2006  . HYPERLIPIDEMIA 12/01/2006  . HYPERTENSION 07/07/2006  . DEPRESSION 03/16/2007  . CHEST PAIN 01/23/2007  . ASTHMA NOS W/ACUTE EXACERBATION 07/10/2010  . Obesity   . Splenomegaly     in college  . Dysmenorrhea   . Fibroid   . TIA (transient ischemic attack) 2012  . 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 Cervical dysplasia   . Cataract     Past Surgical History  Procedure Laterality Date  . Appendectomy    . Myomectomy    . Ovarian cyst removal    . 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 Breast surgery      Reduction  . Pelvic laparoscopy  75,88    DL lysis of adhesions  . Dilation and curettage of uterus  1975    DUB  . Biopsy thyroid  05/02/11  Nonneoplastic goiter  . Gynecologic cryosurgery    . Colposcopy    . Eye surgery      Family History  Problem Relation Age of Onset  . Cancer Maternal Grandmother     Colon Cancer  . Asthma Maternal Grandmother   . Diabetes Father   . Heart disease Father   . Hypertension Father   . Hyperlipidemia Father   . Hypertension Mother   . Heart disease Mother   . Ovarian cancer Mother   . Cancer Mother     Lung cancer  . Asthma Mother   . COPD Mother   . Hyperlipidemia Mother   . Diabetes Brother   . Hypertension Brother   . Kidney disease Brother   . Asthma Brother   . Heart disease Brother   . Hyperlipidemia Brother   . Graves' disease Sister   . Diabetes Sister   . Graves' disease Paternal Grandmother   . Hypertension Paternal Grandmother   . Heart disease Paternal Grandmother   . Alzheimer's disease Paternal Grandmother   . Cancer Maternal  Grandfather     Social History   Social History  . Marital Status: Single    Spouse Name: N/A  . Number of Children: 2  . Years of Education: N/A   Occupational History  . TEACHER    Social History Main Topics  . Smoking status: Never Smoker   . Smokeless tobacco: Never Used  . Alcohol Use: No     Comment: very rare  . Drug Use: No  . Sexual Activity: Not Currently    Birth Control/ Protection: Post-menopausal   Other Topics Concern  . 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 No distress.  Skin:  Papular lesions with excoriations on both forearms Some on hands but not intertriginous spaces Few on legs          Assessment & Plan:

## 2015-01-29 NOTE — Progress Notes (Signed)
Pre visit review using our clinic review tool, if applicable. No additional management support is needed unless otherwise documented below in the visit note. 

## 2015-01-29 NOTE — Patient Instructions (Signed)
Apply this cream to your entire body--from neck down tonight. Wash off after 12 hours or so. Then wash all sheets, towels and clothes in hot water. If the itching continues after 1 week, you may need to see a dermatologist.

## 2015-01-29 NOTE — Assessment & Plan Note (Signed)
Suspicious for scabies Will treat and consider derm evaluation if not improved Will hold off on treating her daughter who she lives with ---since unclear diagnosis

## 2015-02-12 ENCOUNTER — Other Ambulatory Visit: Payer: Self-pay | Admitting: Internal Medicine

## 2015-02-17 ENCOUNTER — Other Ambulatory Visit: Payer: Self-pay | Admitting: Internal Medicine

## 2015-02-17 ENCOUNTER — Encounter: Payer: Self-pay | Admitting: Internal Medicine

## 2015-02-17 DIAGNOSIS — N6310 Unspecified lump in the right breast, unspecified quadrant: Secondary | ICD-10-CM

## 2015-02-23 LAB — HM MAMMOGRAPHY

## 2015-02-25 ENCOUNTER — Ambulatory Visit: Payer: BC Managed Care – PPO | Admitting: Endocrinology

## 2015-03-01 ENCOUNTER — Encounter: Payer: Self-pay | Admitting: Internal Medicine

## 2015-03-03 ENCOUNTER — Encounter: Payer: Self-pay | Admitting: Endocrinology

## 2015-03-03 ENCOUNTER — Ambulatory Visit (INDEPENDENT_AMBULATORY_CARE_PROVIDER_SITE_OTHER): Payer: BC Managed Care – PPO | Admitting: Endocrinology

## 2015-03-03 VITALS — BP 148/76 | HR 91 | Temp 98.3°F | Ht 66.0 in | Wt 247.0 lb

## 2015-03-03 DIAGNOSIS — IMO0002 Reserved for concepts with insufficient information to code with codable children: Secondary | ICD-10-CM

## 2015-03-03 DIAGNOSIS — E1065 Type 1 diabetes mellitus with hyperglycemia: Principal | ICD-10-CM

## 2015-03-03 DIAGNOSIS — E1039 Type 1 diabetes mellitus with other diabetic ophthalmic complication: Secondary | ICD-10-CM | POA: Diagnosis not present

## 2015-03-03 LAB — POCT GLYCOSYLATED HEMOGLOBIN (HGB A1C): HEMOGLOBIN A1C: 9

## 2015-03-03 MED ORDER — TRIAMCINOLONE ACETONIDE 0.1 % EX CREA: 1.0000 "application " | TOPICAL_CREAM | Freq: Four times a day (QID) | CUTANEOUS | Status: DC

## 2015-03-03 MED ORDER — INSULIN GLARGINE 100 UNIT/ML SOLOSTAR PEN: 450.0000 [IU] | PEN_INJECTOR | SUBCUTANEOUS | Status: DC

## 2015-03-03 NOTE — Progress Notes (Signed)
Subjective:    Patient ID: Rebekah Kennedy, female    DOB: 1955-09-09, 59 y.o.   MRN: [id number]  HPI Pt returns for f/u of diabetes mellitus: DM type: Insulin-requiring type 2 Dx'ed: 0761 Complications: CVA, nephropathy, and retinopathy.  Therapy: insulin since 2013 GDM: never DKA: never Severe hypoglycemia: never. Pancreatitis: never Other: due to noncompliance with insulin injections and cbg monitoring, she is on a simple qd insulin regimen.   Interval history: no cbg record, but states cbg's are approx 200.   Pt states few mos of slight rash on the forearms, and assoc itching.  She says rx for scabies did not help.   Past Medical History  Diagnosis Date  . DIABETES MELLITUS, TYPE II 12/01/2006  . HYPERLIPIDEMIA 12/01/2006  . HYPERTENSION 07/07/2006  . DEPRESSION 03/16/2007  . CHEST PAIN 01/23/2007  . ASTHMA NOS W/ACUTE EXACERBATION 07/10/2010  . Obesity   . Splenomegaly     in college  . Dysmenorrhea   . Fibroid   . TIA (transient ischemic attack) 2012  . 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 Cervical dysplasia   . Cataract     Past Surgical History  Procedure Laterality Date  . Appendectomy    . Myomectomy    . Ovarian cyst removal    . 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 Breast surgery      Reduction  . Pelvic laparoscopy  75,88    DL lysis of adhesions  . Dilation and curettage of uterus  1975    DUB  . Biopsy thyroid  05/02/11    Nonneoplastic goiter  . Gynecologic cryosurgery    . Colposcopy    . Eye surgery      Social History   Social History  . Marital Status: Single    Spouse Name: N/A  . Number of Children: 2  . Years of Education: N/A   Occupational History  . TEACHER    Social History Main Topics  . Smoking status: Never Smoker   . Smokeless tobacco: Never Used  . Alcohol Use: No     Comment: very rare  . Drug Use: No  . Sexual Activity: Not Currently    Birth Control/ Protection: Post-menopausal    Other Topics Concern  . 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 albuterol (PROVENTIL HFA;VENTOLIN HFA) 108 (90 BASE) MCG/ACT inhaler Inhale 2 puffs into the lungs every 4 (four) hours as needed for wheezing or shortness of breath. 18 g 2  . amLODipine (NORVASC) 10 MG tablet TAKE ONE TABLET BY MOUTH ONE TIME DAILY 90 tablet 1  . budesonide-formoterol (SYMBICORT) 160-4.5 MCG/ACT inhaler Inhale 2 puffs into the lungs 2 (two) times daily.    . clopidogrel (PLAVIX) 75 MG tablet Take 1 tablet (75 mg total) by mouth daily. 90 tablet 3  . fluconazole (DIFLUCAN) 150 MG tablet Take 1 tablet (150 mg total) by mouth once. 1 tablet 0  . ibuprofen (ADVIL,MOTRIN) 400 MG tablet Take 1 tablet (400 mg total) by mouth every 6 (six) hours as needed. 30 tablet 0  . Insulin Pen Needle 29G X 12MM MISC 1 Device by Does not apply route 2 (two) times daily. 100 each 11  .  losartan (COZAAR) 100 MG tablet TAKE ONE TABLET BY MOUTH ONE TIME DAILY 90 tablet 1  . metoprolol succinate (TOPROL-XL) 100 MG 24 hr tablet Take 1 tablet (100 mg total) by mouth daily. Take with or immediately following a meal. 90 tablet 1  . montelukast (SINGULAIR) 10 MG tablet TAKE ONE TABLET BY MOUTH NIGHTLY AT BEDTIME 30 tablet 2  . Multiple Vitamin (MULTIVITAMIN) tablet Take 1 tablet by mouth daily.      . permethrin (ELIMITE) 5 % cream Apply 1 application topically once. 60 g 1  . pravastatin (PRAVACHOL) 20 MG tablet Take 1 tablet (20 mg total) by mouth daily. 30 tablet 5  . spironolactone (ALDACTONE) 25 MG tablet Take 25 mg by mouth 2 (two) times daily.    . traMADol (ULTRAM) 50 MG tablet Take 1 tablet (50 mg total) by mouth every 6 (six) hours as needed. (Patient not taking: Reported on 03/03/2015) 10 tablet 0   No current facility-administered  medications on file prior to visit.    Allergies  Allergen Reactions  . Quinapril     cough    Family History  Problem Relation Age of Onset  . Cancer Maternal Grandmother     Colon Cancer  . Asthma Maternal Grandmother   . Diabetes Father   . Heart disease Father   . Hypertension Father   . Hyperlipidemia Father   . Hypertension Mother   . Heart disease Mother   . Ovarian cancer Mother   . Cancer Mother     Lung cancer  . Asthma Mother   . COPD Mother   . Hyperlipidemia Mother   . Diabetes Brother   . Hypertension Brother   . Kidney disease Brother   . Asthma Brother   . Heart disease Brother   . Hyperlipidemia Brother   . Graves' disease Sister   . Diabetes Sister   . Graves' disease Paternal Grandmother   . Hypertension Paternal Grandmother   . Heart disease Paternal Grandmother   . Alzheimer's disease Paternal Grandmother   . Cancer Maternal Grandfather     BP 148/76 mmHg  Pulse 91  Temp(Src) 98.3 F (36.8 C) (Oral)  Ht 5\' 6"  (1.676 m)  Wt 247 lb (112.038 kg)  BMI 39.89 kg/m2  SpO2 95%  Review of Systems She denies hypoglycemia.     Objective:   Physical Exam VITAL SIGNS:  See vs page GENERAL: no distress Pulses: dorsalis pedis intact bilat.   MSK: no deformity of the feet CV: trace leg edema Skin:  no ulcer on the feet.  normal color and temp on the feet.  Moderate polymacular rash on the forearms. Neuro: sensation is intact to touch on the feet.   Ext: There is bilateral onychomycosis of the toenails.     Lab Results  Component Value Date   HGBA1C 9.0 03/03/2015       Assessment & Plan:  DM: she needs increased rx.   Rash, new to me, uncertain etiology.   Obesity: persistent.   Patient is advised the following: Patient Instructions  On this type of insulin schedule, you should eat meals on a regular schedule.  If a meal is missed or significantly delayed, your blood sugar could go low. check your blood sugar 2 times a day.  vary the  time of day when you check, between before the 3 meals, and at bedtime.  also check if you have symptoms of your blood sugar being too high or too low.  please keep  a record of the readings and bring it to your next appointment here.  please call us sooner if you are having low blood sugar episodes, or if it stays over 200.   Please come back for a follow-up appointment in 2 months.  Please continue to pursue the weight-loss surgery, and let me know what I need to do to help you with this.   Please increase the lantus to 450 units each morning.   i have sent a prescription to your pharmacy, or the rash.

## 2015-03-03 NOTE — Patient Instructions (Addendum)
On this type of insulin schedule, you should eat meals on a regular schedule.  If a meal is missed or significantly delayed, your blood sugar could go low. check your blood sugar 2 times a day.  vary the time of day when you check, between before the 3 meals, and at bedtime.  also check if you have symptoms of your blood sugar being too high or too low.  please keep a record of the readings and bring it to your next appointment here.  please call us sooner if you are having low blood sugar episodes, or if it stays over 200.   Please come back for a follow-up appointment in 2 months.  Please continue to pursue the weight-loss surgery, and let me know what I need to do to help you with this.   Please increase the lantus to 450 units each morning.   i have sent a prescription to your pharmacy, or the rash.

## 2015-03-16 ENCOUNTER — Encounter: Payer: Self-pay | Admitting: Internal Medicine

## 2015-05-03 ENCOUNTER — Ambulatory Visit: Payer: BC Managed Care – PPO | Admitting: Endocrinology

## 2015-05-05 ENCOUNTER — Telehealth: Payer: Self-pay

## 2015-05-05 NOTE — Telephone Encounter (Signed)
Left Voice Mail for pt to call back.   RE: Flu Vaccine for 2016  

## 2015-05-26 ENCOUNTER — Encounter: Payer: BC Managed Care – PPO | Admitting: Gynecology

## 2015-06-13 ENCOUNTER — Other Ambulatory Visit: Payer: Self-pay | Admitting: Internal Medicine

## 2015-06-13 ENCOUNTER — Other Ambulatory Visit: Payer: Self-pay

## 2015-06-13 MED ORDER — INSULIN GLARGINE 100 UNIT/ML SOLOSTAR PEN: 450.0000 [IU] | PEN_INJECTOR | Freq: Every day | SUBCUTANEOUS | Status: DC

## 2015-07-12 ENCOUNTER — Encounter: Payer: Self-pay | Admitting: Endocrinology

## 2015-07-13 ENCOUNTER — Other Ambulatory Visit: Payer: Self-pay

## 2015-07-15 NOTE — Telephone Encounter (Signed)
Spoke with patient and her CBG's having been dropping since starting her new diet and she wanted to come in to discuss. Apt scheduled for Monday at 9.    KP

## 2015-07-18 ENCOUNTER — Ambulatory Visit (INDEPENDENT_AMBULATORY_CARE_PROVIDER_SITE_OTHER): Payer: BC Managed Care – PPO | Admitting: Endocrinology

## 2015-07-18 ENCOUNTER — Encounter: Payer: Self-pay | Admitting: Endocrinology

## 2015-07-18 VITALS — BP 126/80 | HR 66 | Temp 98.1°F | Ht 66.0 in | Wt 221.2 lb

## 2015-07-18 DIAGNOSIS — IMO0002 Reserved for concepts with insufficient information to code with codable children: Secondary | ICD-10-CM

## 2015-07-18 DIAGNOSIS — E1151 Type 2 diabetes mellitus with diabetic peripheral angiopathy without gangrene: Secondary | ICD-10-CM | POA: Diagnosis not present

## 2015-07-18 DIAGNOSIS — E1039 Type 1 diabetes mellitus with other diabetic ophthalmic complication: Secondary | ICD-10-CM

## 2015-07-18 DIAGNOSIS — Z23 Encounter for immunization: Secondary | ICD-10-CM | POA: Diagnosis not present

## 2015-07-18 DIAGNOSIS — Z794 Long term (current) use of insulin: Secondary | ICD-10-CM | POA: Diagnosis not present

## 2015-07-18 DIAGNOSIS — E1065 Type 1 diabetes mellitus with hyperglycemia: Principal | ICD-10-CM

## 2015-07-18 LAB — POCT GLYCOSYLATED HEMOGLOBIN (HGB A1C): HEMOGLOBIN A1C: 8.7

## 2015-07-18 MED ORDER — INSULIN GLARGINE 100 UNIT/ML SOLOSTAR PEN: 180.0000 [IU] | PEN_INJECTOR | Freq: Every day | SUBCUTANEOUS | Status: DC

## 2015-07-18 NOTE — Progress Notes (Signed)
Subjective:    Patient ID: Rebekah Kennedy, female    DOB: 04/09/1956, 60 y.o.   MRN: [id number]  HPI Pt returns for f/u of diabetes mellitus: DM type: Insulin-requiring type 2 Dx'ed: 123XX123 Complications: CVA, nephropathy, and retinopathy.  Therapy: insulin since 2013 GDM: never DKA: never Severe hypoglycemia: never. Pancreatitis: never Other: due to noncompliance with insulin injections and cbg monitoring, she is on a qd insulin regimen.   Interval history: Since last ov, she has lost 26 lbs, due to her efforts.  She has reduced the insulin to 200 units qd.  She says she has not taken the insulin for the past 5 days.  She says when she was on the insulin, she was having mild hypoglycemia approx twice a week, even with taking just 200 units qd.   Past Medical History  Diagnosis Date  . DIABETES MELLITUS, TYPE II 12/01/2006  . HYPERLIPIDEMIA 12/01/2006  . HYPERTENSION 07/07/2006  . DEPRESSION 03/16/2007  . CHEST PAIN 01/23/2007  . ASTHMA NOS W/ACUTE EXACERBATION 07/10/2010  . Obesity   . Splenomegaly     in college  . Dysmenorrhea   . Fibroid   . TIA (transient ischemic attack) 2012  . 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 Cervical dysplasia   . Cataract     Past Surgical History  Procedure Laterality Date  . Appendectomy    . Myomectomy    . Ovarian cyst removal    . 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 Breast surgery      Reduction  . Pelvic laparoscopy  75,88    DL lysis of adhesions  . Dilation and curettage of uterus  1975    DUB  . Biopsy thyroid  05/02/11    Nonneoplastic goiter  . Gynecologic cryosurgery    . Colposcopy    . Eye surgery      Social History   Social History  . Marital Status: Single    Spouse Name: N/A  . Number of Children: 2  . Years of Education: N/A   Occupational History  . TEACHER    Social History Main Topics  . Smoking status: Never Smoker   . Smokeless tobacco: Never Used  . Alcohol Use: No   Comment: very rare  . Drug Use: No  . Sexual Activity: Not Currently    Birth Control/ Protection: Post-menopausal   Other Topics Concern  . 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 albuterol (PROVENTIL HFA;VENTOLIN HFA) 108 (90 BASE) MCG/ACT inhaler Inhale 2 puffs into the lungs every 4 (four) hours as needed for wheezing or shortness of breath. 18 g 2  . amLODipine (NORVASC) 10 MG tablet TAKE ONE TABLET BY MOUTH ONE TIME DAILY 90 tablet 1  . budesonide-formoterol (SYMBICORT) 160-4.5 MCG/ACT inhaler Inhale 2 puffs into the lungs 2 (two) times daily.    . clopidogrel (PLAVIX) 75 MG tablet Take 1 tablet (75 mg total) by mouth daily. 90 tablet 3  . Insulin Pen Needle 29G X 12MM MISC 1 Device by Does not apply route 2 (two) times daily. 100 each 11  . losartan (COZAAR) 100 MG tablet TAKE ONE TABLET BY MOUTH ONE TIME DAILY 90 tablet 1  .  metoprolol succinate (TOPROL-XL) 100 MG 24 hr tablet Take 1 tablet (100 mg total) by mouth daily. Take with or immediately following a meal. 90 tablet 1  . montelukast (SINGULAIR) 10 MG tablet TAKE ONE TABLET BY MOUTH NIGHTLY AT BEDTIME 30 tablet 2  . Multiple Vitamin (MULTIVITAMIN) tablet Take 1 tablet by mouth daily.      . pravastatin (PRAVACHOL) 20 MG tablet Take 1 tablet (20 mg total) by mouth daily. 30 tablet 5  . spironolactone (ALDACTONE) 25 MG tablet Take 25 mg by mouth 2 (two) times daily.    . traMADol (ULTRAM) 50 MG tablet Take 1 tablet (50 mg total) by mouth every 6 (six) hours as needed. 10 tablet 0  . triamcinolone cream (KENALOG) 0.1 % Apply 1 application topically 4 (four) times daily. As needed for itching 45 g 11   No current facility-administered medications on file prior to visit.    Allergies  Allergen Reactions  . Quinapril     cough     Family History  Problem Relation Age of Onset  . Cancer Maternal Grandmother     Colon Cancer  . Asthma Maternal Grandmother   . Diabetes Father   . Heart disease Father   . Hypertension Father   . Hyperlipidemia Father   . Hypertension Mother   . Heart disease Mother   . Ovarian cancer Mother   . Cancer Mother     Lung cancer  . Asthma Mother   . COPD Mother   . Hyperlipidemia Mother   . Diabetes Brother   . Hypertension Brother   . Kidney disease Brother   . Asthma Brother   . Heart disease Brother   . Hyperlipidemia Brother   . Graves' disease Sister   . Diabetes Sister   . Graves' disease Paternal Grandmother   . Hypertension Paternal Grandmother   . Heart disease Paternal Grandmother   . Alzheimer's disease Paternal Grandmother   . Cancer Maternal Grandfather     BP 126/80 mmHg  Pulse 66  Temp(Src) 98.1 F (36.7 C) (Oral)  Ht 5\' 6"  (1.676 m)  Wt 221 lb 4 oz (100.358 kg)  BMI 35.73 kg/m2  Review of Systems Denies LOC    Objective:   Physical Exam VITAL SIGNS:  See vs page GENERAL: no distress Pulses: dorsalis pedis intact bilat.   MSK: no deformity of the feet CV: no leg edema Skin:  no ulcer on the feet.  normal color and temp on the feet. Neuro: sensation is intact to touch on the feet.     A1c=8.7%    Assessment & Plan:  Weight loss.  This is reducing her insulin requirement DM: apparently overcontrolled Noncompliance with insulin taking: this a1c is probably increased due to being off the insulin.    Patient is advised the following: Patient Instructions  On this type of insulin schedule, you should eat meals on a regular schedule.  If a meal is missed or significantly delayed, your blood sugar could go low. check your blood sugar 2 times a day.  vary the time of day when you check, between before the 3 meals, and at bedtime.  also check if you have symptoms of your blood sugar being too high or too low.  please keep a record of the  readings and bring it to your next appointment here.  please call us sooner if you are having low blood sugar episodes, or if it stays over 200.   Please come back for a  follow-up appointment in 1 month. Please call us next week, to tell us how the blood sugar is doing. Please reduce the basaglar insulin to 180 units each morning.   Please call [phone], to get an appointment with a new PCP.

## 2015-07-18 NOTE — Patient Instructions (Addendum)
On this type of insulin schedule, you should eat meals on a regular schedule.  If a meal is missed or significantly delayed, your blood sugar could go low. check your blood sugar 2 times a day.  vary the time of day when you check, between before the 3 meals, and at bedtime.  also check if you have symptoms of your blood sugar being too high or too low.  please keep a record of the readings and bring it to your next appointment here.  please call us sooner if you are having low blood sugar episodes, or if it stays over 200.   Please come back for a follow-up appointment in 1 month. Please call us next week, to tell us how the blood sugar is doing. Please reduce the basaglar insulin to 180 units each morning.   Please call [phone], to get an appointment with a new PCP.

## 2015-07-19 DIAGNOSIS — E1159 Type 2 diabetes mellitus with other circulatory complications: Secondary | ICD-10-CM | POA: Insufficient documentation

## 2015-07-19 DIAGNOSIS — E119 Type 2 diabetes mellitus without complications: Secondary | ICD-10-CM | POA: Insufficient documentation

## 2015-08-21 NOTE — Progress Notes (Signed)
Subjective:    Patient ID: Rebekah Kennedy, female    DOB: Apr 10, 1956, 60 y.o.   MRN: [id number]  HPI Pt returns for f/u of diabetes mellitus: DM type: Insulin-requiring type 2 Dx'ed: 123XX123 Complications: CVA, nephropathy, and retinopathy.  Therapy: insulin since 2013 GDM: never DKA: never Severe hypoglycemia: never. Pancreatitis: never Other: due to noncompliance with insulin injections and cbg monitoring, she is on a qd insulin regimen.   Interval history: Pt says she never misses the insulin.  She takes 180 units qam.  no cbg record, but states cbg's are frequently in the 200's.  pt states she feels well in general.  Past Medical History  Diagnosis Date  . DIABETES MELLITUS, TYPE II 12/01/2006  . HYPERLIPIDEMIA 12/01/2006  . HYPERTENSION 07/07/2006  . DEPRESSION 03/16/2007  . CHEST PAIN 01/23/2007  . ASTHMA NOS W/ACUTE EXACERBATION 07/10/2010  . Obesity   . Splenomegaly     in college  . Dysmenorrhea   . Fibroid   . TIA (transient ischemic attack) 2012  . 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 Cervical dysplasia   . Cataract     Past Surgical History  Procedure Laterality Date  . Appendectomy    . Myomectomy    . Ovarian cyst removal    . 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 Breast surgery      Reduction  . Pelvic laparoscopy  75,88    DL lysis of adhesions  . Dilation and curettage of uterus  1975    DUB  . Biopsy thyroid  05/02/11    Nonneoplastic goiter  . Gynecologic cryosurgery    . Colposcopy    . Eye surgery      Social History   Social History  . Marital Status: Single    Spouse Name: N/A  . Number of Children: 2  . Years of Education: N/A   Occupational History  . TEACHER    Social History Main Topics  . Smoking status: Never Smoker   . Smokeless tobacco: Never Used  . Alcohol Use: No     Comment: very rare  . Drug Use: No  . Sexual Activity: Not Currently    Birth Control/ Protection: Post-menopausal   Other  Topics Concern  . 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 albuterol (PROVENTIL HFA;VENTOLIN HFA) 108 (90 BASE) MCG/ACT inhaler Inhale 2 puffs into the lungs every 4 (four) hours as needed for wheezing or shortness of breath. 18 g 2  . amLODipine (NORVASC) 10 MG tablet TAKE ONE TABLET BY MOUTH ONE TIME DAILY 90 tablet 1  . budesonide-formoterol (SYMBICORT) 160-4.5 MCG/ACT inhaler Inhale 2 puffs into the lungs 2 (two) times daily.    . clopidogrel (PLAVIX) 75 MG tablet Take 1 tablet (75 mg total) by mouth daily. 90 tablet 3  . Insulin Pen Needle 29G X 12MM MISC 1 Device by Does not apply route 2 (two) times daily. 100 each 11  . losartan (COZAAR) 100 MG tablet TAKE ONE TABLET BY MOUTH ONE TIME DAILY 90 tablet 1  . metoprolol succinate (TOPROL-XL) 100 MG 24 hr tablet Take 1 tablet (100 mg total) by mouth daily. Take with or immediately following a meal. 90  tablet 1  . montelukast (SINGULAIR) 10 MG tablet TAKE ONE TABLET BY MOUTH NIGHTLY AT BEDTIME 30 tablet 2  . Multiple Vitamin (MULTIVITAMIN) tablet Take 1 tablet by mouth daily.      . pravastatin (PRAVACHOL) 20 MG tablet Take 1 tablet (20 mg total) by mouth daily. 30 tablet 5  . spironolactone (ALDACTONE) 25 MG tablet Take 25 mg by mouth 2 (two) times daily.    . traMADol (ULTRAM) 50 MG tablet Take 1 tablet (50 mg total) by mouth every 6 (six) hours as needed. 10 tablet 0  . triamcinolone cream (KENALOG) 0.1 % Apply 1 application topically 4 (four) times daily. As needed for itching 45 g 11   No current facility-administered medications on file prior to visit.    Allergies  Allergen Reactions  . Quinapril     cough    Family History  Problem Relation Age of Onset  . Cancer Maternal Grandmother     Colon Cancer  . Asthma Maternal Grandmother   .  Diabetes Father   . Heart disease Father   . Hypertension Father   . Hyperlipidemia Father   . Hypertension Mother   . Heart disease Mother   . Ovarian cancer Mother   . Cancer Mother     Lung cancer  . Asthma Mother   . COPD Mother   . Hyperlipidemia Mother   . Diabetes Brother   . Hypertension Brother   . Kidney disease Brother   . Asthma Brother   . Heart disease Brother   . Hyperlipidemia Brother   . Graves' disease Sister   . Diabetes Sister   . Graves' disease Paternal Grandmother   . Hypertension Paternal Grandmother   . Heart disease Paternal Grandmother   . Alzheimer's disease Paternal Grandmother   . Cancer Maternal Grandfather     BP 134/72 mmHg  Pulse 74  Temp(Src) 98 F (36.7 C)  Resp 14  Ht 5\' 6"  (1.676 m)  Wt 229 lb 6.4 oz (104.055 kg)  BMI 37.04 kg/m2  SpO2 96%  Review of Systems She denies hypoglycemia.  She has regained some of the weight she lost.    Objective:   Physical Exam VITAL SIGNS:  See vs page GENERAL: no distress Pulses: dorsalis pedis intact bilat.   MSK: no deformity of the feet CV: no leg edema Skin:  no ulcer on the feet.  normal color and temp on the feet. Neuro: sensation is intact to touch on the feet   Lab Results  Component Value Date   HGBA1C 8.7 07/18/2015      Assessment & Plan:  DM: she needs increased rx.  Morbid obesity, worse.  We discussed the need for her to re-lose.    Patient is advised the following: Patient Instructions  On this type of insulin schedule, you should eat meals on a regular schedule.  If a meal is missed or significantly delayed, your blood sugar could go low. check your blood sugar 2 times a day.  vary the time of day when you check, between before the 3 meals, and at bedtime.  also check if you have symptoms of your blood sugar being too high or too low.  please keep a record of the readings and bring it to your next appointment here.  please call us sooner if you are having low blood  sugar episodes, or if it stays over 200.   Please come back for a follow-up appointment in 6 weeks.   Please  increase the basaglar insulin to 200 units each morning.

## 2015-08-22 ENCOUNTER — Ambulatory Visit (INDEPENDENT_AMBULATORY_CARE_PROVIDER_SITE_OTHER): Payer: BC Managed Care – PPO | Admitting: Endocrinology

## 2015-08-22 ENCOUNTER — Encounter: Payer: Self-pay | Admitting: Endocrinology

## 2015-08-22 VITALS — BP 134/72 | HR 74 | Temp 98.0°F | Resp 14 | Ht 66.0 in | Wt 229.4 lb

## 2015-08-22 DIAGNOSIS — E1151 Type 2 diabetes mellitus with diabetic peripheral angiopathy without gangrene: Secondary | ICD-10-CM | POA: Diagnosis not present

## 2015-08-22 DIAGNOSIS — Z794 Long term (current) use of insulin: Secondary | ICD-10-CM | POA: Diagnosis not present

## 2015-08-22 MED ORDER — INSULIN GLARGINE 100 UNIT/ML SOLOSTAR PEN: 200.0000 [IU] | PEN_INJECTOR | SUBCUTANEOUS | Status: DC

## 2015-08-22 NOTE — Patient Instructions (Addendum)
On this type of insulin schedule, you should eat meals on a regular schedule.  If a meal is missed or significantly delayed, your blood sugar could go low. check your blood sugar 2 times a day.  vary the time of day when you check, between before the 3 meals, and at bedtime.  also check if you have symptoms of your blood sugar being too high or too low.  please keep a record of the readings and bring it to your next appointment here.  please call us sooner if you are having low blood sugar episodes, or if it stays over 200.   Please come back for a follow-up appointment in 6 weeks.   Please increase the basaglar insulin to 200 units each morning.

## 2015-08-26 ENCOUNTER — Other Ambulatory Visit: Payer: Self-pay

## 2015-08-26 MED ORDER — BASAGLAR KWIKPEN 100 UNIT/ML ~~LOC~~ SOPN: 200.0000 [IU] | PEN_INJECTOR | Freq: Every day | SUBCUTANEOUS | Status: DC

## 2015-09-09 ENCOUNTER — Other Ambulatory Visit: Payer: Self-pay

## 2015-09-14 ENCOUNTER — Telehealth: Payer: Self-pay | Admitting: Internal Medicine

## 2015-09-14 ENCOUNTER — Emergency Department (HOSPITAL_COMMUNITY)
Admission: EM | Admit: 2015-09-14 | Discharge: 2015-09-15 | Disposition: A | Discharge status: Home / Self Care | Payer: BC Managed Care – PPO | Attending: Emergency Medicine | Admitting: Emergency Medicine

## 2015-09-14 DIAGNOSIS — Z79891 Long term (current) use of opiate analgesic: Secondary | ICD-10-CM | POA: Insufficient documentation

## 2015-09-14 DIAGNOSIS — R112 Nausea with vomiting, unspecified: Secondary | ICD-10-CM | POA: Diagnosis present

## 2015-09-14 DIAGNOSIS — Z79899 Other long term (current) drug therapy: Secondary | ICD-10-CM | POA: Diagnosis not present

## 2015-09-14 DIAGNOSIS — E785 Hyperlipidemia, unspecified: Secondary | ICD-10-CM | POA: Diagnosis not present

## 2015-09-14 DIAGNOSIS — F329 Major depressive disorder, single episode, unspecified: Secondary | ICD-10-CM | POA: Diagnosis not present

## 2015-09-14 DIAGNOSIS — J45901 Unspecified asthma with (acute) exacerbation: Secondary | ICD-10-CM | POA: Diagnosis not present

## 2015-09-14 DIAGNOSIS — E669 Obesity, unspecified: Secondary | ICD-10-CM | POA: Insufficient documentation

## 2015-09-14 DIAGNOSIS — Z7951 Long term (current) use of inhaled steroids: Secondary | ICD-10-CM | POA: Diagnosis not present

## 2015-09-14 DIAGNOSIS — Z7902 Long term (current) use of antithrombotics/antiplatelets: Secondary | ICD-10-CM | POA: Insufficient documentation

## 2015-09-14 DIAGNOSIS — Z8673 Personal history of transient ischemic attack (TIA), and cerebral infarction without residual deficits: Secondary | ICD-10-CM | POA: Diagnosis not present

## 2015-09-14 DIAGNOSIS — E119 Type 2 diabetes mellitus without complications: Secondary | ICD-10-CM | POA: Insufficient documentation

## 2015-09-14 DIAGNOSIS — Z794 Long term (current) use of insulin: Secondary | ICD-10-CM | POA: Insufficient documentation

## 2015-09-14 DIAGNOSIS — A084 Viral intestinal infection, unspecified: Secondary | ICD-10-CM | POA: Insufficient documentation

## 2015-09-14 DIAGNOSIS — I10 Essential (primary) hypertension: Secondary | ICD-10-CM | POA: Diagnosis not present

## 2015-09-14 NOTE — Telephone Encounter (Signed)
Called and left voicemail for pt to make an appt with a new pcp.

## 2015-09-14 NOTE — Telephone Encounter (Signed)
Pt needs refill on metoprolol succinate er 100mg 

## 2015-09-15 ENCOUNTER — Emergency Department (HOSPITAL_COMMUNITY): Payer: BC Managed Care – PPO

## 2015-09-15 ENCOUNTER — Telehealth: Payer: Self-pay | Admitting: *Deleted

## 2015-09-15 ENCOUNTER — Encounter (HOSPITAL_COMMUNITY): Payer: Self-pay | Admitting: Emergency Medicine

## 2015-09-15 LAB — URINE MICROSCOPIC-ADD ON: RBC / HPF: NONE SEEN RBC/hpf (ref 0–5)

## 2015-09-15 LAB — COMPREHENSIVE METABOLIC PANEL
ALBUMIN: 3.9 g/dL (ref 3.5–5.0)
ALK PHOS: 75 U/L (ref 38–126)
ALT: 23 U/L (ref 14–54)
ANION GAP: 8 (ref 5–15)
AST: 17 U/L (ref 15–41)
BILIRUBIN TOTAL: 0.4 mg/dL (ref 0.3–1.2)
BUN: 10 mg/dL (ref 6–20)
CALCIUM: 9.6 mg/dL (ref 8.9–10.3)
CO2: 28 mmol/L (ref 22–32)
CREATININE: 0.68 mg/dL (ref 0.44–1.00)
Chloride: 106 mmol/L (ref 101–111)
GFR calc Af Amer: >60 mL/min (ref >60)
GFR calc non Af Amer: >60 mL/min (ref >60)
GLUCOSE: 208 mg/dL — AB (ref 65–99)
Potassium: 3.8 mmol/L (ref 3.5–5.1)
Sodium: 142 mmol/L (ref 135–145)
TOTAL PROTEIN: 7.7 g/dL (ref 6.5–8.1)

## 2015-09-15 LAB — CBC
HCT: 42.7 % (ref 36.0–46.0)
HEMOGLOBIN: 14.1 g/dL (ref 12.0–15.0)
MCH: 27.2 pg (ref 26.0–34.0)
MCHC: 33 g/dL (ref 30.0–36.0)
MCV: 82.3 fL (ref 78.0–100.0)
PLATELETS: 383 10*3/uL (ref 150–400)
RBC: 5.19 MIL/uL — ABNORMAL HIGH (ref 3.87–5.11)
RDW: 14.2 % (ref 11.5–15.5)
WBC: 17.3 10*3/uL — ABNORMAL HIGH (ref 4.0–10.5)

## 2015-09-15 LAB — URINALYSIS, ROUTINE W REFLEX MICROSCOPIC
BILIRUBIN URINE: NEGATIVE
Glucose, UA: NEGATIVE mg/dL
Hgb urine dipstick: NEGATIVE
KETONES UR: NEGATIVE mg/dL
Leukocytes, UA: NEGATIVE
NITRITE: NEGATIVE
Protein, ur: 100 mg/dL — AB
Specific Gravity, Urine: 1.026 (ref 1.005–1.030)
pH: 5.5 (ref 5.0–8.0)

## 2015-09-15 LAB — DIFFERENTIAL
Basophils Absolute: 0 10*3/uL (ref 0.0–0.1)
Basophils Relative: 0 %
EOS PCT: 4 %
Eosinophils Absolute: 0.6 10*3/uL (ref 0.0–0.7)
LYMPHS ABS: 2.8 10*3/uL (ref 0.7–4.0)
LYMPHS PCT: 16 %
MONO ABS: 0.4 10*3/uL (ref 0.1–1.0)
Monocytes Relative: 3 %
NEUTROS ABS: 13.4 10*3/uL — AB (ref 1.7–7.7)
Neutrophils Relative %: 77 %

## 2015-09-15 LAB — LIPASE, BLOOD: Lipase: 16 U/L (ref 11–51)

## 2015-09-15 MED ORDER — SODIUM CHLORIDE 0.9 % IV BOLUS (SEPSIS)
500.0000 mL | Freq: Once | INTRAVENOUS | Status: AC
  Administered 2015-09-15: 500 mL via INTRAVENOUS

## 2015-09-15 MED ORDER — DIATRIZOATE MEGLUMINE & SODIUM 66-10 % PO SOLN
30.0000 mL | Freq: Once | ORAL | Status: AC
  Administered 2015-09-15: 30 mL via ORAL

## 2015-09-15 MED ORDER — ONDANSETRON 4 MG PO TBDP
4.0000 mg | ORAL_TABLET | Freq: Once | ORAL | Status: AC | PRN
  Administered 2015-09-15: 4 mg via ORAL
  Filled 2015-09-15: qty 1

## 2015-09-15 MED ORDER — ONDANSETRON 8 MG PO TBDP: 8.0000 mg | ORAL_TABLET | Freq: Three times a day (TID) | ORAL | Status: DC | PRN

## 2015-09-15 MED ORDER — IOPAMIDOL (ISOVUE-300) INJECTION 61%
100.0000 mL | Freq: Once | INTRAVENOUS | Status: AC | PRN
  Administered 2015-09-15: 100 mL via INTRAVENOUS

## 2015-09-15 MED ORDER — SODIUM CHLORIDE 0.9 % IV SOLN
8.0000 mg | Freq: Once | INTRAVENOUS | Status: DC
  Filled 2015-09-15: qty 4

## 2015-09-15 MED ORDER — ONDANSETRON HCL 4 MG/2ML IJ SOLN
4.0000 mg | Freq: Once | INTRAMUSCULAR | Status: AC
  Administered 2015-09-15: 4 mg via INTRAVENOUS
  Filled 2015-09-15: qty 2

## 2015-09-15 MED ORDER — SODIUM CHLORIDE 0.9 % IV SOLN: 8.0000 mg | Freq: Once | INTRAVENOUS | Status: DC

## 2015-09-15 NOTE — Telephone Encounter (Signed)
Received call from [place] she states pt has an appt on 09/19/15 for breast biopsy. Dr. Jenny Reichmann signed order for pt to have her U/S. We faxed over order for biopsy but it was faxed back not sign. They are wanting to know will Dr. Jenny Reichmann sign biopsy order...Rebekah Kennedy

## 2015-09-15 NOTE — Discharge Instructions (Signed)
RECOMMEND USE OF PROBIOTICS WHILE HAVING DIARRHEA. TAKE ZOFRAN AS DIRECTED FOR NAUSEA. PUSH FLUIDS. RETURN TO THE EMERGENCY DEPARTMENT WITH ANY WORSENING SYMPTOMS OR NEW CONCERNS.    Viral Gastroenteritis Viral gastroenteritis is also known as stomach flu. This condition affects the stomach and intestinal tract. It can cause sudden diarrhea and vomiting. The illness typically lasts 3 to 8 days. Most people develop an immune response that eventually gets rid of the virus. While this natural response develops, the virus can make you quite ill. CAUSES  Many different viruses can cause gastroenteritis, such as rotavirus or noroviruses. You can catch one of these viruses by consuming contaminated food or water. You may also catch a virus by sharing utensils or other personal items with an infected person or by touching a contaminated surface. SYMPTOMS  The most common symptoms are diarrhea and vomiting. These problems can cause a severe loss of body fluids (dehydration) and a body salt (electrolyte) imbalance. Other symptoms may include:  Fever.  Headache.  Fatigue.  Abdominal pain. DIAGNOSIS  Your caregiver can usually diagnose viral gastroenteritis based on your symptoms and a physical exam. A stool sample may also be taken to test for the presence of viruses or other infections. TREATMENT  This illness typically goes away on its own. Treatments are aimed at rehydration. The most serious cases of viral gastroenteritis involve vomiting so severely that you are not able to keep fluids down. In these cases, fluids must be given through an intravenous line (IV). HOME CARE INSTRUCTIONS   Drink enough fluids to keep your urine clear or pale yellow. Drink small amounts of fluids frequently and increase the amounts as tolerated.  Ask your caregiver for specific rehydration instructions.  Avoid:  Foods high in sugar.  Alcohol.  Carbonated drinks.  Tobacco.  Juice.  Caffeine  drinks.  Extremely hot or cold fluids.  Fatty, greasy foods.  Too much intake of anything at one time.  Dairy products until 24 to 48 hours after diarrhea stops.  You may consume probiotics. Probiotics are active cultures of beneficial bacteria. They may lessen the amount and number of diarrheal stools in adults. Probiotics can be found in yogurt with active cultures and in supplements.  Wash your hands well to avoid spreading the virus.  Only take over-the-counter or prescription medicines for pain, discomfort, or fever as directed by your caregiver. Do not give aspirin to children. Antidiarrheal medicines are not recommended.  Ask your caregiver if you should continue to take your regular prescribed and over-the-counter medicines.  Keep all follow-up appointments as directed by your caregiver. SEEK IMMEDIATE MEDICAL CARE IF:   You are unable to keep fluids down.  You do not urinate at least once every 6 to 8 hours.  You develop shortness of breath.  You notice blood in your stool or vomit. This may look like coffee grounds.  You have abdominal pain that increases or is concentrated in one small area (localized).  You have persistent vomiting or diarrhea.  You have a fever.  The patient is a child younger than 3 months, and he or she has a fever.  The patient is a child older than 3 months, and he or she has a fever and persistent symptoms.  The patient is a child older than 3 months, and he or she has a fever and symptoms suddenly get worse.  The patient is a baby, and he or she has no tears when crying. MAKE SURE YOU:   Understand these  instructions.  Will watch your condition.  Will get help right away if you are not doing well or get worse.   This information is not intended to replace advice given to you by your health care provider. Make sure you discuss any questions you have with your health care provider.   Document Released: 05/07/2005 Document Revised:  07/30/2011 Document Reviewed: 02/21/2011 Elsevier Interactive Patient Education [employer].

## 2015-09-15 NOTE — ED Notes (Addendum)
Patient presents for umbilical pain, N/V/D [id number] days. Describes pain as cramping, 10+ episodes of emesis and watery diarrhea, subjective fever, no relief with imodium.

## 2015-09-15 NOTE — Telephone Encounter (Signed)
[place] for verbal, or I can sign if needed

## 2015-09-15 NOTE — Telephone Encounter (Signed)
Notified jessica w/MD response she is needing order fax back inform her once he signs i will fax back.../l;mb

## 2015-09-15 NOTE — ED Notes (Signed)
Nehemiah Settle, PA-C informed pt is vomiting.  See orders.

## 2015-09-15 NOTE — ED Provider Notes (Signed)
CSN: [id number]     Arrival date & time 09/14/15  2349 History   First MD Initiated Contact with Patient 09/15/15 0159     Chief Complaint  Patient presents with  . Emesis  . Diarrhea   60 y.o. female with PMhx of DM2 and multiple abdominal surgeries, presents to the Emergency Department for persistent diarrhea and vomiting x 6 days. Friday, 09/09/15, patient began experiencing intermittent, non-radiating, cramping like, periumbilical pain with associated watery diarrhea, chills, and subjective fever. Pain occurrs minutes before having diarrhea, several times per day and lasts for a several minutes after, until resolving on own. Tried OTC imodium with mild relief. Today, began experiencing non-bilious, non-bloody, emesis x 10 with persistent nausea. Has not been able to intake fluids or medications today due to nausea and emesis. No travel outside country, however did recently return from trip out of town. Denies melena, hematochezia, foul smelling stool, changes in urinary frequency, vaginal d/c, HA, night sweats, CP, or SOB.   (Consider location/radiation/quality/duration/timing/severity/associated sxs/prior Treatment) Patient is a 60 y.o. female presenting with vomiting and diarrhea.  Emesis Associated symptoms: diarrhea   Associated symptoms: no chills and no myalgias   Diarrhea Associated symptoms: vomiting   Associated symptoms: no chills, no fever and no myalgias     Past Medical History  Diagnosis Date  . DIABETES MELLITUS, TYPE II 12/01/2006  . HYPERLIPIDEMIA 12/01/2006  . HYPERTENSION 07/07/2006  . DEPRESSION 03/16/2007  . CHEST PAIN 01/23/2007  . ASTHMA NOS W/ACUTE EXACERBATION 07/10/2010  . Obesity   . Splenomegaly     in college  . Dysmenorrhea   . Fibroid   . TIA (transient ischemic attack) 2012  . 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 Cervical dysplasia   . Cataract    Past Surgical History  Procedure Laterality Date  . Appendectomy    .  Myomectomy    . Ovarian cyst removal    . 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 Breast surgery      Reduction  . Pelvic laparoscopy  75,88    DL lysis of adhesions  . Dilation and curettage of uterus  1975    DUB  . Biopsy thyroid  05/02/11    Nonneoplastic goiter  . Gynecologic cryosurgery    . Colposcopy    . Eye surgery     Family History  Problem Relation Age of Onset  . Cancer Maternal Grandmother     Colon Cancer  . Asthma Maternal Grandmother   . Diabetes Father   . Heart disease Father   . Hypertension Father   . Hyperlipidemia Father   . Hypertension Mother   . Heart disease Mother   . Ovarian cancer Mother   . Cancer Mother     Lung cancer  . Asthma Mother   . COPD Mother   . Hyperlipidemia Mother   . Diabetes Brother   . Hypertension Brother   . Kidney disease Brother   . Asthma Brother   . Heart disease Brother   . Hyperlipidemia Brother   . Graves' disease Sister   . Diabetes Sister   . Graves' disease Paternal Grandmother   . Hypertension Paternal Grandmother   . Heart disease Paternal Grandmother   . Alzheimer's disease Paternal Grandmother   . Cancer Maternal Grandfather    Social History  Substance Use Topics  . Smoking status: Never Smoker   . Smokeless tobacco: Never Used  . Alcohol Use: No  Comment: very rare   OB History    Gravida Para Term Preterm AB TAB SAB Ectopic Multiple Living   0              Review of Systems  Constitutional: Negative for fever and chills.  HENT: Negative.   Respiratory: Negative.   Cardiovascular: Negative.   Gastrointestinal: Positive for nausea, vomiting and diarrhea. Negative for blood in stool.  Musculoskeletal: Negative.  Negative for myalgias.  Skin: Negative.   Neurological: Negative.       Allergies  Quinapril  Home Medications   Prior to Admission medications   Medication Sig Start Date End Date Taking? Authorizing Provider  albuterol (PROVENTIL HFA;VENTOLIN HFA) 108 (90 BASE)  MCG/ACT inhaler Inhale 2 puffs into the lungs every 4 (four) hours as needed for wheezing or shortness of breath. 03/02/14  Yes Hendricks Limes, MD  amLODipine (NORVASC) 10 MG tablet TAKE ONE TABLET BY MOUTH ONE TIME DAILY Patient taking differently: TAKE 10 MG BY MOUTH ONE TIME DAILY 02/14/15  Yes Hendricks Limes, MD  budesonide-formoterol [facility]) 160-4.5 MCG/ACT inhaler Inhale 2 puffs into the lungs 2 (two) times daily.   Yes Historical Provider, MD  clopidogrel (PLAVIX) 75 MG tablet Take 1 tablet (75 mg total) by mouth daily. 11/04/14  Yes Donika Keith Rake, DO  Insulin Glargine (BASAGLAR KWIKPEN) 100 UNIT/ML SOPN Inject 2 mLs (200 Units total) into the skin at bedtime. 08/26/15  Yes Renato Shin, MD  Insulin Pen Needle 29G X 12MM MISC 1 Device by Does not apply route 2 (two) times daily. 07/09/14  Yes Renato Shin, MD  losartan (COZAAR) 100 MG tablet TAKE ONE TABLET BY MOUTH ONE TIME DAILY Patient taking differently: TAKE100 MG BY MOUTH ONE TIME DAILY 02/14/15  Yes Hendricks Limes, MD  metoprolol succinate (TOPROL-XL) 100 MG 24 hr tablet Take 1 tablet (100 mg total) by mouth daily. Take with or immediately following a meal. 06/21/14  Yes Hendricks Limes, MD  montelukast (SINGULAIR) 10 MG tablet TAKE ONE TABLET BY MOUTH NIGHTLY AT BEDTIME Patient taking differently: TAKE 10 MG BY MOUTH NIGHTLY AT BEDTIME 02/14/15  Yes Hendricks Limes, MD  Multiple Vitamin (MULTIVITAMIN) tablet Take 1 tablet by mouth daily.     Yes Historical Provider, MD  pravastatin (PRAVACHOL) 20 MG tablet Take 1 tablet (20 mg total) by mouth daily. 11/04/14  Yes Donika Keith Rake, DO  spironolactone (ALDACTONE) 25 MG tablet Take 25 mg by mouth 2 (two) times daily.   Yes Historical Provider, MD  traMADol (ULTRAM) 50 MG tablet Take 1 tablet (50 mg total) by mouth every 6 (six) hours as needed. Patient not taking: Reported on 09/15/2015 01/18/14   Carlisle Cater, PA-C  triamcinolone cream (KENALOG) 0.1 % Apply 1 application topically 4  (four) times daily. As needed for itching Patient not taking: Reported on 09/15/2015 03/03/15   Renato Shin, MD   BP 169/70 mmHg  Pulse 80  Temp(Src) 98.8 F (37.1 C) (Oral)  Resp 18  Ht 5\' 6"  (1.676 m)  Wt 104.327 kg  BMI 37.14 kg/m2  SpO2 99% Physical Exam  Constitutional: She is oriented to person, place, and time. She appears well-developed and well-nourished.  HENT:  Head: Normocephalic.  Eyes: Conjunctivae are normal.  Neck: Normal range of motion. Neck supple.  Cardiovascular: Normal rate and regular rhythm.   No murmur heard. Pulmonary/Chest: Effort normal and breath sounds normal. She has no wheezes. She has no rales.  Abdominal: Soft. Bowel sounds are normal.  There is tenderness. There is no rebound and no guarding.  Periumbilical abdominal tenderness without rigidity or guarding.   Musculoskeletal: Normal range of motion.  Neurological: She is alert and oriented to person, place, and time.  Skin: Skin is warm and dry. No rash noted.  Psychiatric: She has a normal mood and affect.    ED Course  Procedures (including critical care time) Labs Review Labs Reviewed  COMPREHENSIVE METABOLIC PANEL - Abnormal; Notable for the following:    Glucose, Bld 208 (*)    All other components within normal limits  CBC - Abnormal; Notable for the following:    WBC 17.3 (*)    RBC 5.19 (*)    All other components within normal limits  URINALYSIS, ROUTINE W REFLEX MICROSCOPIC (NOT AT [facility]) - Abnormal; Notable for the following:    Protein, ur 100 (*)    All other components within normal limits  URINE MICROSCOPIC-ADD ON - Abnormal; Notable for the following:    Squamous Epithelial / LPF 0-5 (*)    Bacteria, UA RARE (*)    All other components within normal limits  LIPASE, BLOOD   Results for orders placed or performed during the hospital encounter of 09/14/15  Lipase, blood  Result Value Ref Range   Lipase 16 11 - 51 U/L  Comprehensive metabolic panel  Result Value Ref  Range   Sodium 142 135 - 145 mmol/L   Potassium 3.8 3.5 - 5.1 mmol/L   Chloride 106 101 - 111 mmol/L   CO2 28 22 - 32 mmol/L   Glucose, Bld 208 (H) 65 - 99 mg/dL   BUN 10 6 - 20 mg/dL   Creatinine, Ser 0.68 0.44 - 1.00 mg/dL   Calcium 9.6 8.9 - 10.3 mg/dL   Total Protein 7.7 6.5 - 8.1 g/dL   Albumin 3.9 3.5 - 5.0 g/dL   AST 17 15 - 41 U/L   ALT 23 14 - 54 U/L   Alkaline Phosphatase 75 38 - 126 U/L   Total Bilirubin 0.4 0.3 - 1.2 mg/dL   GFR calc non Af Amer >60 >60 mL/min   GFR calc Af Amer >60 >60 mL/min   Anion gap 8 5 - 15  CBC  Result Value Ref Range   WBC 17.3 (H) 4.0 - 10.5 K/uL   RBC 5.19 (H) 3.87 - 5.11 MIL/uL   Hemoglobin 14.1 12.0 - 15.0 g/dL   HCT 42.7 36.0 - 46.0 %   MCV 82.3 78.0 - 100.0 fL   MCH 27.2 26.0 - 34.0 pg   MCHC 33.0 30.0 - 36.0 g/dL   RDW 14.2 11.5 - 15.5 %   Platelets 383 150 - 400 K/uL  Urinalysis, Routine w reflex microscopic (not at [facility])  Result Value Ref Range   Color, Urine YELLOW YELLOW   APPearance CLEAR CLEAR   Specific Gravity, Urine 1.026 1.005 - 1.030   pH 5.5 5.0 - 8.0   Glucose, UA NEGATIVE NEGATIVE mg/dL   Hgb urine dipstick NEGATIVE NEGATIVE   Bilirubin Urine NEGATIVE NEGATIVE   Ketones, ur NEGATIVE NEGATIVE mg/dL   Protein, ur 100 (A) NEGATIVE mg/dL   Nitrite NEGATIVE NEGATIVE   Leukocytes, UA NEGATIVE NEGATIVE  Urine microscopic-add on  Result Value Ref Range   Squamous Epithelial / LPF 0-5 (A) NONE SEEN   WBC, UA 0-5 0 - 5 WBC/hpf   RBC / HPF NONE SEEN 0 - 5 RBC/hpf   Bacteria, UA RARE (A) NONE SEEN   Urine-Other AMORPHOUS URATES/PHOSPHATES  Ct Abdomen Pelvis W Contrast  09/15/2015  CLINICAL DATA:  Umbilical abdominal pain EXAM: CT ABDOMEN AND PELVIS WITH CONTRAST TECHNIQUE: Multidetector CT imaging of the abdomen and pelvis was performed using the standard protocol following bolus administration of intravenous contrast. CONTRAST:  141mL ISOVUE-300 IOPAMIDOL (ISOVUE-300) INJECTION 61% COMPARISON:  None. FINDINGS: Lower  chest and abdominal wall: Subcutaneous reticulation and skin thickening over the lower quadrants, likely medication injection sites Hepatobiliary: No focal liver abnormality.No evidence of biliary obstruction or stone. Pancreas: Unremarkable. Spleen: Unremarkable. Adrenals/Urinary Tract: Negative adrenals. No hydronephrosis or stone. Unremarkable bladder. Reproductive:Pelvic floor laxity.  Negative adnexae. Stomach/Bowel: No obstruction. Appendectomy. No inflammatory findings. Vascular/Lymphatic: No acute vascular abnormality. No mass or adenopathy. Peritoneal: No ascites or pneumoperitoneum. Musculoskeletal: Lower lumbar facet arthropathy.  No acute finding. IMPRESSION: No acute finding or explanation for abdominal pain. Electronically Signed   By: Monte Fantasia M.D.   On: 09/15/2015 04:39     Imaging Review No results found. I have personally reviewed and evaluated these images and lab results as part of my medical decision-making.   EKG Interpretation None      MDM   Final diagnoses:  None    1. Viral gastroenteritis  The patient presents with abdominal discomfort, N, V, D for 4 days. No sick contacts. No fever, but "felt warm" at home. She reports unable to take any PO's without vomiting.   The patient has significant tenderness of abdomen, leukocytosis of 17. CT performed and there are no acute findings. During stay in the ED, daughter started developing symptoms of N, V, supporting viral etiology of patient's symptoms. She is currently feeling better, tolerating PO fluids wtihout further vomiting. She can be discharged home with return precautions.     Charlann Lange, PA-C 09/15/15 [place], MD 09/15/15 [phone]

## 2015-09-19 ENCOUNTER — Other Ambulatory Visit: Payer: Self-pay | Admitting: Radiology

## 2015-09-19 LAB — HM MAMMOGRAPHY

## 2015-09-22 ENCOUNTER — Encounter: Payer: Self-pay | Admitting: Internal Medicine

## 2015-10-03 ENCOUNTER — Encounter: Payer: Self-pay | Admitting: Endocrinology

## 2015-10-03 ENCOUNTER — Ambulatory Visit (INDEPENDENT_AMBULATORY_CARE_PROVIDER_SITE_OTHER): Payer: BC Managed Care – PPO | Admitting: Endocrinology

## 2015-10-03 ENCOUNTER — Ambulatory Visit: Payer: BC Managed Care – PPO | Admitting: Endocrinology

## 2015-10-03 VITALS — BP 142/84 | HR 102 | Wt 234.0 lb

## 2015-10-03 DIAGNOSIS — Z794 Long term (current) use of insulin: Secondary | ICD-10-CM

## 2015-10-03 DIAGNOSIS — E1151 Type 2 diabetes mellitus with diabetic peripheral angiopathy without gangrene: Secondary | ICD-10-CM

## 2015-10-03 LAB — POCT GLYCOSYLATED HEMOGLOBIN (HGB A1C): HEMOGLOBIN A1C: 8.3

## 2015-10-03 MED ORDER — BASAGLAR KWIKPEN 100 UNIT/ML ~~LOC~~ SOPN: 220.0000 [IU] | PEN_INJECTOR | SUBCUTANEOUS | Status: DC

## 2015-10-03 NOTE — Patient Instructions (Addendum)
On this type of insulin schedule, you should eat meals on a regular schedule.  If a meal is missed or significantly delayed, your blood sugar could go low. check your blood sugar 2 times a day.  vary the time of day when you check, between before the 3 meals, and at bedtime.  also check if you have symptoms of your blood sugar being too high or too low.  please keep a record of the readings and bring it to your next appointment here.  please call us sooner if you are having low blood sugar episodes, or if it stays over 200.   Please come back for a follow-up appointment in 3-4 months.   Please increase the basaglar insulin to 220 units each morning.  i have sent a prescription to your pharmacy.

## 2015-10-03 NOTE — Progress Notes (Signed)
Subjective:    Patient ID: Rebekah Kennedy, female    DOB: 02/06/1956, 60 y.o.   MRN: [id number]  HPI Pt returns for f/u of diabetes mellitus: DM type: Insulin-requiring type 2 Dx'ed: [id number] Complications: CVA, nephropathy, and retinopathy.  Therapy: insulin since 2013 GDM: never DKA: never Severe hypoglycemia: never. Pancreatitis: never Other: due to noncompliance with insulin injections and cbg monitoring, she is on a qd insulin regimen.   Interval history: Pt says she misses the insulin approx twice per month  She takes 300 units qam.  We discussed, and pt is certain she is taking 300 units qam.  no cbg record, but states cbg's vary from 140-250.  pt states she feels well in general.   Past Medical History  Diagnosis Date  . DIABETES MELLITUS, TYPE II 12/01/2006  . HYPERLIPIDEMIA 12/01/2006  . HYPERTENSION 07/07/2006  . DEPRESSION 03/16/2007  . CHEST PAIN 01/23/2007  . ASTHMA NOS W/ACUTE EXACERBATION 07/10/2010  . Obesity   . Splenomegaly     in college  . Dysmenorrhea   . Fibroid   . TIA (transient ischemic attack) 2012  . 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 Cervical dysplasia   . Cataract     Past Surgical History  Procedure Laterality Date  . Appendectomy    . Myomectomy    . Ovarian cyst removal    . 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 Breast surgery      Reduction  . Pelvic laparoscopy  75,88    DL lysis of adhesions  . Dilation and curettage of uterus  1975    DUB  . Biopsy thyroid  05/02/11    Nonneoplastic goiter  . Gynecologic cryosurgery    . Colposcopy    . Eye surgery      Social History   Social History  . Marital Status: Single    Spouse Name: N/A  . Number of Children: 2  . Years of Education: N/A   Occupational History  . TEACHER    Social History Main Topics  . Smoking status: Never Smoker   . Smokeless tobacco: Never Used  . Alcohol Use: No     Comment: very rare  . Drug Use: No  . Sexual Activity: Not  Currently    Birth Control/ Protection: Post-menopausal   Other Topics Concern  . 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 albuterol (PROVENTIL HFA;VENTOLIN HFA) 108 (90 BASE) MCG/ACT inhaler Inhale 2 puffs into the lungs every 4 (four) hours as needed for wheezing or shortness of breath. 18 g 2  . amLODipine (NORVASC) 10 MG tablet TAKE ONE TABLET BY MOUTH ONE TIME DAILY (Patient taking differently: TAKE 10 MG BY MOUTH ONE TIME DAILY) 90 tablet 1  . budesonide-formoterol (SYMBICORT) 160-4.5 MCG/ACT inhaler Inhale 2 puffs into the lungs 2 (two) times daily.    . clopidogrel (PLAVIX) 75 MG tablet Take 1 tablet (75 mg total) by mouth daily. 90 tablet 3  . Insulin Pen Needle 29G X 12MM MISC 1 Device by Does not apply route 2 (two) times daily. 100 each 11  . losartan (COZAAR) 100 MG tablet TAKE ONE TABLET BY MOUTH ONE TIME DAILY (Patient taking differently: TAKE100  MG BY MOUTH ONE TIME DAILY) 90 tablet 1  . metoprolol succinate (TOPROL-XL) 100 MG 24 hr tablet Take 1 tablet (100 mg total) by mouth daily. Take with or immediately following a meal. 90 tablet 1  . montelukast (SINGULAIR) 10 MG tablet TAKE ONE TABLET BY MOUTH NIGHTLY AT BEDTIME (Patient taking differently: TAKE 10 MG BY MOUTH NIGHTLY AT BEDTIME) 30 tablet 2  . Multiple Vitamin (MULTIVITAMIN) tablet Take 1 tablet by mouth daily.      . ondansetron (ZOFRAN ODT) 8 MG disintegrating tablet Take 1 tablet (8 mg total) by mouth every 8 (eight) hours as needed for nausea or vomiting. 20 tablet 0  . pravastatin (PRAVACHOL) 20 MG tablet Take 1 tablet (20 mg total) by mouth daily. 30 tablet 5  . spironolactone (ALDACTONE) 25 MG tablet Take 25 mg by mouth 2 (two) times daily.    . traMADol (ULTRAM) 50 MG tablet Take 1 tablet (50 mg total) by mouth every 6 (six)  hours as needed. 10 tablet 0  . triamcinolone cream (KENALOG) 0.1 % Apply 1 application topically 4 (four) times daily. As needed for itching 45 g 11   No current facility-administered medications on file prior to visit.    Allergies  Allergen Reactions  . Quinapril     cough    Family History  Problem Relation Age of Onset  . Cancer Maternal Grandmother     Colon Cancer  . Asthma Maternal Grandmother   . Diabetes Father   . Heart disease Father   . Hypertension Father   . Hyperlipidemia Father   . Hypertension Mother   . Heart disease Mother   . Ovarian cancer Mother   . Cancer Mother     Lung cancer  . Asthma Mother   . COPD Mother   . Hyperlipidemia Mother   . Diabetes Brother   . Hypertension Brother   . Kidney disease Brother   . Asthma Brother   . Heart disease Brother   . Hyperlipidemia Brother   . Graves' disease Sister   . Diabetes Sister   . Graves' disease Paternal Grandmother   . Hypertension Paternal Grandmother   . Heart disease Paternal Grandmother   . Alzheimer's disease Paternal Grandmother   . Cancer Maternal Grandfather     BP 142/84 mmHg  Pulse 102  Wt 234 lb (106.142 kg)  SpO2 95%  Review of Systems She denies hypoglycemia.      Objective:   Physical Exam VITAL SIGNS:  See vs page GENERAL: no distress Pulses: dorsalis pedis intact bilat.   MSK: no deformity of the feet CV: no leg edema Skin:  no ulcer on the feet.  normal color and temp on the feet. Neuro: sensation is intact to touch on the feet.   A1c=8.3%    Assessment & Plan:  DM: she needs increased rx.  Noncompliance with cbg recording and insulin: for patient safety, we'll have to change insulin dosage to 220 units qam, despite her assertion of 300 units qd, as of now.    Patient is advised the following: Patient Instructions  On this type of insulin schedule, you should eat meals on a regular schedule.  If a meal is missed or significantly delayed, your blood sugar  could go low. check your blood sugar 2 times a day.  vary the time of day when you check, between before the 3 meals, and at bedtime.  also check if you have symptoms of your blood sugar being too high  or too low.  please keep a record of the readings and bring it to your next appointment here.  please call us sooner if you are having low blood sugar episodes, or if it stays over 200.   Please come back for a follow-up appointment in 3-4 months.   Please increase the basaglar insulin to 220 units each morning.  i have sent a prescription to your pharmacy.

## 2015-10-06 ENCOUNTER — Ambulatory Visit (INDEPENDENT_AMBULATORY_CARE_PROVIDER_SITE_OTHER): Payer: BC Managed Care – PPO | Admitting: Family

## 2015-10-06 ENCOUNTER — Telehealth: Payer: Self-pay | Admitting: Endocrinology

## 2015-10-06 ENCOUNTER — Encounter: Payer: Self-pay | Admitting: Family

## 2015-10-06 VITALS — BP 120/70 | HR 91 | Temp 98.2°F | Ht 66.0 in | Wt 255.0 lb

## 2015-10-06 DIAGNOSIS — Z23 Encounter for immunization: Secondary | ICD-10-CM | POA: Diagnosis not present

## 2015-10-06 DIAGNOSIS — T148 Other injury of unspecified body region: Secondary | ICD-10-CM | POA: Diagnosis not present

## 2015-10-06 DIAGNOSIS — T148XXA Other injury of unspecified body region, initial encounter: Secondary | ICD-10-CM

## 2015-10-06 NOTE — Telephone Encounter (Signed)
I contacted the pt and left a vm advising the pt would need to follow up with her PCP or an Urgent care to to be evaluated. Requested a call back if the pt would like to discuss.

## 2015-10-06 NOTE — Telephone Encounter (Signed)
See note below and please advise, Thanks! 

## 2015-10-06 NOTE — Patient Instructions (Signed)
Schedule CPE.  If there is no improvement in your symptoms, or if there is any worsening of symptoms, or if you have any additional concerns, please return for re-evaluation; or, if we are closed, consider going to the Emergency Room for evaluation if symptoms urgent.  Tdap Vaccine (Tetanus, Diphtheria and Pertussis): What You Need to Know 1. Why get vaccinated? Tetanus, diphtheria and pertussis are very serious diseases. Tdap vaccine can protect [place] from these diseases. And, Tdap vaccine given to pregnant women can protect newborn babies against pertussis. TETANUS (Lockjaw) is rare in the [place] today. It causes painful muscle tightening and stiffness, usually all over the body.  It can lead to tightening of muscles in the head and neck so you can't open your mouth, swallow, or sometimes even breathe. Tetanus kills about 1 out of 10 people who are infected even after receiving the best medical care. DIPHTHERIA is also rare in the [place] today. It can cause a thick coating to form in the back of the throat.  It can lead to breathing problems, heart failure, paralysis, and death. PERTUSSIS (Whooping Cough) causes severe coughing spells, which can cause difficulty breathing, vomiting and disturbed sleep.  It can also lead to weight loss, incontinence, and rib fractures. Up to 2 in 100 adolescents and 5 in 100 adults with pertussis are hospitalized or have complications, which could include pneumonia or death. These diseases are caused by bacteria. Diphtheria and pertussis are spread from person to person through secretions from coughing or sneezing. Tetanus enters the body through cuts, scratches, or wounds. Before vaccines, as many as 200,000 cases of diphtheria, 200,000 cases of pertussis, and hundreds of cases of tetanus, were reported in the [place] each year. Since vaccination began, reports of cases for tetanus and diphtheria have dropped by about 99% and for pertussis by  about 80%. 2. Tdap vaccine Tdap vaccine can protect adolescents and adults from tetanus, diphtheria, and pertussis. One dose of Tdap is routinely given at age 85 or 20. People who did not get Tdap at that age should get it as soon as possible. Tdap is especially important for healthcare professionals and anyone having close contact with a baby younger than 12 months. Pregnant women should get a dose of Tdap during every pregnancy, to protect the newborn from pertussis. Infants are most at risk for severe, life-threatening complications from pertussis. Another vaccine, called Td, protects against tetanus and diphtheria, but not pertussis. A Td booster should be given every 10 years. Tdap may be given as one of these boosters if you have never gotten Tdap before. Tdap may also be given after a severe cut or burn to prevent tetanus infection. Your doctor or the person giving you the vaccine can give you more information. Tdap may safely be given at the same time as other vaccines. 3. Some people should not get this vaccine  A person who has ever had a life-threatening allergic reaction after a previous dose of any diphtheria, tetanus or pertussis containing vaccine, OR has a severe allergy to any part of this vaccine, should not get Tdap vaccine. Tell the person giving the vaccine about any severe allergies.  Anyone who had coma or long repeated seizures within 7 days after a childhood dose of DTP or DTaP, or a previous dose of Tdap, should not get Tdap, unless a cause other than the vaccine was found. They can still get Td.  Talk to your doctor if you:  have seizures or  another nervous system problem,  had severe pain or swelling after any vaccine containing diphtheria, tetanus or pertussis,  ever had a condition called Guillain-Barr Syndrome (GBS),  aren't feeling well on the day the shot is scheduled. 4. Risks With any medicine, including vaccines, there is a chance of side effects. These  are usually mild and go away on their own. Serious reactions are also possible but are rare. Most people who get Tdap vaccine do not have any problems with it. Mild problems following Tdap (Did not interfere with activities)  Pain where the shot was given (about 3 in 4 adolescents or 2 in 3 adults)  Redness or swelling where the shot was given (about 1 person in 5)  Mild fever of at least 100.49F (up to about 1 in 25 adolescents or 1 in 100 adults)  Headache (about 3 or 4 people in 10)  Tiredness (about 1 person in 3 or 4)  Nausea, vomiting, diarrhea, stomach ache (up to 1 in 4 adolescents or 1 in 10 adults)  Chills, sore joints (about 1 person in 10)  Body aches (about 1 person in 3 or 4)  Rash, swollen glands (uncommon) Moderate problems following Tdap (Interfered with activities, but did not require medical attention)  Pain where the shot was given (up to 1 in 5 or 6)  Redness or swelling where the shot was given (up to about 1 in 16 adolescents or 1 in 12 adults)  Fever over 102F (about 1 in 100 adolescents or 1 in 250 adults)  Headache (about 1 in 7 adolescents or 1 in 10 adults)  Nausea, vomiting, diarrhea, stomach ache (up to 1 or 3 people in 100)  Swelling of the entire arm where the shot was given (up to about 1 in 500). Severe problems following Tdap (Unable to perform usual activities; required medical attention)  Swelling, severe pain, bleeding and redness in the arm where the shot was given (rare). Problems that could happen after any vaccine:  People sometimes faint after a medical procedure, including vaccination. Sitting or lying down for about 15 minutes can help prevent fainting, and injuries caused by a fall. Tell your doctor if you feel dizzy, or have vision changes or ringing in the ears.  Some people get severe pain in the shoulder and have difficulty moving the arm where a shot was given. This happens very rarely.  Any medication can cause a  severe allergic reaction. Such reactions from a vaccine are very rare, estimated at fewer than 1 in a million doses, and would happen within a few minutes to a few hours after the vaccination. As with any medicine, there is a very remote chance of a vaccine causing a serious injury or death. The safety of vaccines is always being monitored. For more information, visit: http://www.aguilar.org/ 5. What if there is a serious problem? What should I look for?  Look for anything that concerns you, such as signs of a severe allergic reaction, very high fever, or unusual behavior.  Signs of a severe allergic reaction can include hives, swelling of the face and throat, difficulty breathing, a fast heartbeat, dizziness, and weakness. These would usually start a few minutes to a few hours after the vaccination. What should I do?  If you think it is a severe allergic reaction or other emergency that can't wait, call 9-1-1 or get the person to the nearest hospital. Otherwise, call your doctor.  Afterward, the reaction should be reported to the Vaccine Adverse  Event Reporting System (VAERS). Your doctor might file this report, or you can do it yourself through the VAERS web site at www.vaers.SamedayNews.es, or by calling 6091454087. VAERS does not give medical advice.  6. The National Vaccine Injury Compensation Program The [employer] Vaccine Injury Compensation Program (VICP) is a federal program that was created to compensate people who may have been injured by certain vaccines. Persons who believe they may have been injured by a vaccine can learn about the program and about filing a claim by calling (231)274-3359 or visiting the [place] website at GoldCloset.com.ee. There is a time limit to file a claim for compensation. 7. How can I learn more?  Ask your doctor. He or she can give you the vaccine package insert or suggest other sources of information.  Call your local or state health  department.  Contact the Centers for Disease Control and Prevention (CDC):  Call 315-490-0558 (1-800-CDC-INFO) or  Visit CDC's website at http://hunter.com/ CDC Tdap Vaccine VIS (07/14/13)   This information is not intended to replace advice given to you by your health care provider. Make sure you discuss any questions you have with your health care provider.   Document Released: 11/06/2011 Document Revised: 05/28/2014 Document Reviewed: 08/19/2013 Elsevier Interactive Patient Education [employer].

## 2015-10-06 NOTE — Progress Notes (Signed)
Pre visit review using our clinic review tool, if applicable. No additional management support is needed unless otherwise documented below in the visit note. 

## 2015-10-06 NOTE — Telephone Encounter (Signed)
i am not PCP

## 2015-10-06 NOTE — Progress Notes (Signed)
Subjective:    Patient ID: Rebekah Kennedy, female    DOB: [birth year], 60 y.o.   MRN: [id number]   Rebekah Kennedy is a 60 y.o. female who presents today for an acute visit.    HPI Comments: She is here for evaluation of puncture to left foot when she stepped on an earring last night. Patient states that she is behind on her tetanus.   Past Medical History  Diagnosis Date  . DIABETES MELLITUS, TYPE II 12/01/2006  . HYPERLIPIDEMIA 12/01/2006  . HYPERTENSION 07/07/2006  . DEPRESSION 03/16/2007  . CHEST PAIN 01/23/2007  . ASTHMA NOS W/ACUTE EXACERBATION 07/10/2010  . Obesity   . Splenomegaly     in college  . Dysmenorrhea   . Fibroid   . TIA (transient ischemic attack) 2012  . 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 Cervical dysplasia   . Cataract    Allergies: Quinapril Current Outpatient Prescriptions on File Prior to Visit  Medication Sig Dispense Refill  . albuterol (PROVENTIL HFA;VENTOLIN HFA) 108 (90 BASE) MCG/ACT inhaler Inhale 2 puffs into the lungs every 4 (four) hours as needed for wheezing or shortness of breath. 18 g 2  . amLODipine (NORVASC) 10 MG tablet TAKE ONE TABLET BY MOUTH ONE TIME DAILY (Patient taking differently: TAKE 10 MG BY MOUTH ONE TIME DAILY) 90 tablet 1  . budesonide-formoterol (SYMBICORT) 160-4.5 MCG/ACT inhaler Inhale 2 puffs into the lungs 2 (two) times daily.    . clopidogrel (PLAVIX) 75 MG tablet Take 1 tablet (75 mg total) by mouth daily. 90 tablet 3  . Insulin Glargine (BASAGLAR KWIKPEN) 100 UNIT/ML SOPN Inject 2.2 mLs (220 Units total) into the skin every morning. 90 mL 11  . Insulin Pen Needle 29G X 12MM MISC 1 Device by Does not apply route 2 (two) times daily. 100 each 11  . losartan (COZAAR) 100 MG tablet TAKE ONE TABLET BY MOUTH ONE TIME DAILY (Patient taking differently: TAKE100 MG BY MOUTH ONE TIME DAILY) 90 tablet 1  . metoprolol succinate (TOPROL-XL) 100 MG 24 hr tablet Take 1 tablet (100 mg total) by mouth daily.  Take with or immediately following a meal. 90 tablet 1  . montelukast (SINGULAIR) 10 MG tablet TAKE ONE TABLET BY MOUTH NIGHTLY AT BEDTIME (Patient taking differently: TAKE 10 MG BY MOUTH NIGHTLY AT BEDTIME) 30 tablet 2  . Multiple Vitamin (MULTIVITAMIN) tablet Take 1 tablet by mouth daily.      . ondansetron (ZOFRAN ODT) 8 MG disintegrating tablet Take 1 tablet (8 mg total) by mouth every 8 (eight) hours as needed for nausea or vomiting. 20 tablet 0  . pravastatin (PRAVACHOL) 20 MG tablet Take 1 tablet (20 mg total) by mouth daily. 30 tablet 5  . spironolactone (ALDACTONE) 25 MG tablet Take 25 mg by mouth 2 (two) times daily.    . traMADol (ULTRAM) 50 MG tablet Take 1 tablet (50 mg total) by mouth every 6 (six) hours as needed. 10 tablet 0  . triamcinolone cream (KENALOG) 0.1 % Apply 1 application topically 4 (four) times daily. As needed for itching 45 g 11   No current facility-administered medications on file prior to visit.    Social History  Substance Use Topics  . Smoking status: Never Smoker   . Smokeless tobacco: Never Used  . Alcohol Use: No     Comment: very rare    Review of Systems  Constitutional: Negative for fever and chills.  Respiratory: Negative  for cough.   Cardiovascular: Negative for chest pain and palpitations.  Gastrointestinal: Negative for nausea and vomiting.      Objective:    BP 120/70 mmHg  Pulse 91  Temp(Src) 98.2 F (36.8 C) (Oral)  Ht 5\' 6"  (1.676 m)  Wt 255 lb (115.667 kg)  BMI 41.18 kg/m2  SpO2 97%   Physical Exam  Constitutional: She appears well-developed and well-nourished.  Eyes: Conjunctivae are normal.  Cardiovascular: Normal rate, regular rhythm, normal heart sounds and normal pulses.   Pulmonary/Chest: Effort normal and breath sounds normal. She has no wheezes. She has no rhonchi. She has no rales.  Neurological: She is alert.  Skin: Skin is warm and dry.  Unable to appreciate puncture wound ventral aspect of left foot. No  erythema, swelling, discharge, streaking.  Psychiatric: She has a normal mood and affect. Her speech is normal and behavior is normal. Thought content normal.  Vitals reviewed.      Assessment & Plan:   1. Need for prophylactic vaccination with combined diphtheria-tetanus-pertussis (DTP) vaccine  - Tdap vaccine greater than or equal to 7yo IM  2. Puncture wound Healed. No signs or symptoms of bacterial infection.   I am having Ms. Olthoff maintain her multivitamin, budesonide-formoterol, spironolactone, traMADol, albuterol, metoprolol succinate, Insulin Pen Needle, pravastatin, clopidogrel, montelukast, amLODipine, losartan, triamcinolone cream, ondansetron, and BASAGLAR KWIKPEN.   No orders of the defined types were placed in this encounter.     Start medications as prescribed and explained to patient on After Visit Summary ( AVS). Risks, benefits, and alternatives of the medications and treatment plan prescribed today were discussed, and patient expressed understanding.   Education regarding symptom management and diagnosis given to patient.   Follow-up:Plan follow-up as discussed or as needed if any worsening symptoms or change in condition.   Continue to follow with No primary care provider on file. for routine health maintenance.   Rebekah Kennedy and I agreed with plan.   Mable Paris, FNP

## 2015-10-06 NOTE — Telephone Encounter (Signed)
Patient stated she stepped on a nail, and is due for a tetanus shot. Is Dr Loanne Drilling her primary??

## 2015-10-07 ENCOUNTER — Other Ambulatory Visit: Payer: Self-pay | Admitting: Internal Medicine

## 2015-10-25 ENCOUNTER — Other Ambulatory Visit: Payer: Self-pay | Admitting: *Deleted

## 2015-10-25 MED ORDER — LOSARTAN POTASSIUM 100 MG PO TABS: ORAL_TABLET | ORAL | Status: DC

## 2015-10-25 MED ORDER — AMLODIPINE BESYLATE 10 MG PO TABS: ORAL_TABLET | ORAL | Status: DC

## 2015-10-25 NOTE — Addendum Note (Signed)
Addended by: Earnstine Regal on: 10/25/2015 12:55 PM   Modules accepted: Orders

## 2015-11-10 ENCOUNTER — Telehealth: Payer: Self-pay | Admitting: General Practice

## 2015-11-10 DIAGNOSIS — I10 Essential (primary) hypertension: Secondary | ICD-10-CM

## 2015-11-10 DIAGNOSIS — R000 Tachycardia, unspecified: Secondary | ICD-10-CM

## 2015-11-10 MED ORDER — MONTELUKAST SODIUM 10 MG PO TABS: ORAL_TABLET | ORAL | Status: DC

## 2015-11-10 MED ORDER — AMLODIPINE BESYLATE 10 MG PO TABS: ORAL_TABLET | ORAL | Status: DC

## 2015-11-10 MED ORDER — LOSARTAN POTASSIUM 100 MG PO TABS: ORAL_TABLET | ORAL | Status: DC

## 2015-11-10 MED ORDER — METOPROLOL SUCCINATE ER 100 MG PO TB24: 100.0000 mg | ORAL_TABLET | Freq: Every day | ORAL | Status: DC

## 2015-11-10 NOTE — Telephone Encounter (Signed)
Sent!

## 2015-11-10 NOTE — Telephone Encounter (Signed)
Pt called in and said that she went on a trip and airport lost her luggage.  Lost all of her meds and they have not recovered it yet.  She needs new scripts for Singular, Losarten, Amlodipine and Metoprolol.  She has an appt to see Dr burns.

## 2015-11-11 NOTE — Telephone Encounter (Signed)
Losartan & Amlodipine rx printed fax scripts to pharmacy...Rebekah Kennedy

## 2015-11-13 ENCOUNTER — Other Ambulatory Visit: Payer: Self-pay | Admitting: Neurology

## 2015-11-14 ENCOUNTER — Other Ambulatory Visit: Payer: Self-pay | Admitting: Neurology

## 2015-11-14 ENCOUNTER — Other Ambulatory Visit: Payer: Self-pay | Admitting: *Deleted

## 2015-11-14 NOTE — Telephone Encounter (Signed)
Rx refused.  Patient needs an appointment.  Last appointment was in 2014.

## 2015-11-15 ENCOUNTER — Other Ambulatory Visit: Payer: Self-pay | Admitting: *Deleted

## 2015-11-15 NOTE — Telephone Encounter (Signed)
Rx refused.  Patient needs an appointment.

## 2015-11-25 ENCOUNTER — Ambulatory Visit: Payer: BC Managed Care – PPO | Admitting: Neurology

## 2015-12-16 ENCOUNTER — Encounter: Payer: Self-pay | Admitting: Neurology

## 2015-12-16 ENCOUNTER — Ambulatory Visit (INDEPENDENT_AMBULATORY_CARE_PROVIDER_SITE_OTHER): Payer: BC Managed Care – PPO | Admitting: Neurology

## 2015-12-16 ENCOUNTER — Other Ambulatory Visit (INDEPENDENT_AMBULATORY_CARE_PROVIDER_SITE_OTHER): Payer: BC Managed Care – PPO

## 2015-12-16 VITALS — BP 110/80 | HR 92 | Ht 66.0 in | Wt 235.6 lb

## 2015-12-16 DIAGNOSIS — R252 Cramp and spasm: Secondary | ICD-10-CM

## 2015-12-16 DIAGNOSIS — R202 Paresthesia of skin: Secondary | ICD-10-CM | POA: Diagnosis not present

## 2015-12-16 DIAGNOSIS — G463 Brain stem stroke syndrome: Secondary | ICD-10-CM | POA: Diagnosis not present

## 2015-12-16 LAB — TSH: TSH: 1.81 u[IU]/mL (ref 0.35–4.50)

## 2015-12-16 LAB — VITAMIN B12: Vitamin B-12: 903 pg/mL (ref 211–911)

## 2015-12-16 LAB — MAGNESIUM: Magnesium: 1.8 mg/dL (ref 1.5–2.5)

## 2015-12-16 MED ORDER — PRAVASTATIN SODIUM 20 MG PO TABS: 20.0000 mg | ORAL_TABLET | Freq: Every day | ORAL | 3 refills | Status: DC

## 2015-12-16 MED ORDER — CYCLOBENZAPRINE HCL 5 MG PO TABS: 5.0000 mg | ORAL_TABLET | Freq: Every evening | ORAL | 5 refills | Status: DC | PRN

## 2015-12-16 MED ORDER — CLOPIDOGREL BISULFATE 75 MG PO TABS: 75.0000 mg | ORAL_TABLET | Freq: Every day | ORAL | 3 refills | Status: DC

## 2015-12-16 NOTE — Progress Notes (Signed)
Follow-up Visit   Date: 12/16/15    Rebekah Kennedy MRN: [id number] DOB: [birth year]   Interim History: Rebekah Kennedy is a 60 y.o. right-handed African American female with history of uncontrolled diabetes mellitus (dx 2008, HbA1c 11.8), hyperlipidemia, hypertension, depression, and R pontine stroke (2012, mild left residual weakness) returning to the clinic for follow-up of leg cramps.  The patient was accompanied to the clinic by self.   History of present illness: About early September 2014, she notice stabbing pain over the left medial upper thigh, lasting 30 seconds. It spontaneously resolved. A week later, she had the same symptoms over the right leg and abdomen. It started occuring about once per day, but now occuring 1-3 x per day. The areas are localized to a quarter size area over the left inner upper thigh/groin, right anterior thigh, and bilateral lower anterior chest. There is no radiation or shooting quality to her symptoms. She denies any triggers. No exacerbating or alleviating factors. Denies any weakness or numbness/tingling of the feet. No saddle anesthesia or incontinence.  UPDATE 11/04/2014:  She was last seen in the office on 03/24/2013 for abnormal sensation of her legs, which improved once she stopped taking statin therapy. She is not taking anything for cholesterol at this time nor engaged in an active weight loss program.  She has no new complaints and is here for refills on her plavix.    UPDATE 12/16/2015:  She presents today with new complaints of bilateral leg cramps and randomly she has electrical shock in the left leg. She mostly has electrical shock sensation in the left leg, which occurs daily, lasting a brief second.  It usually occurs at rest. There is no weakness and she denies low back pain.  She also complains of bilateral leg swelling.  Medications:  Current Outpatient Prescriptions on File Prior to Visit  Medication Sig Dispense Refill  .  albuterol (PROVENTIL HFA;VENTOLIN HFA) 108 (90 BASE) MCG/ACT inhaler Inhale 2 puffs into the lungs every 4 (four) hours as needed for wheezing or shortness of breath. 18 g 2  . amLODipine (NORVASC) 10 MG tablet TAKE 10 MG BY MOUTH ONE TIME DAILY Keep appt w/new PCP for future refills 90 tablet 0  . budesonide-formoterol (SYMBICORT) 160-4.5 MCG/ACT inhaler Inhale 2 puffs into the lungs 2 (two) times daily.    . Insulin Glargine (BASAGLAR KWIKPEN) 100 UNIT/ML SOPN Inject 2.2 mLs (220 Units total) into the skin every morning. 90 mL 11  . Insulin Pen Needle 29G X 12MM MISC 1 Device by Does not apply route 2 (two) times daily. 100 each 11  . losartan (COZAAR) 100 MG tablet TAKE100 MG BY MOUTH ONE TIME DAILY Keep new appt w/new PCP for refills 90 tablet 0  . metoprolol succinate (TOPROL-XL) 100 MG 24 hr tablet Take 1 tablet (100 mg total) by mouth daily. Take with or immediately following a meal. 90 tablet 0  . montelukast (SINGULAIR) 10 MG tablet TAKE 10 MG BY MOUTH NIGHTLY AT BEDTIME 30 tablet 2  . Multiple Vitamin (MULTIVITAMIN) tablet Take 1 tablet by mouth daily.      . ondansetron (ZOFRAN ODT) 8 MG disintegrating tablet Take 1 tablet (8 mg total) by mouth every 8 (eight) hours as needed for nausea or vomiting. 20 tablet 0  . spironolactone (ALDACTONE) 25 MG tablet Take 25 mg by mouth 2 (two) times daily.    . traMADol (ULTRAM) 50 MG tablet Take 1 tablet (50 mg total) by mouth every 6 (  six) hours as needed. 10 tablet 0  . triamcinolone cream (KENALOG) 0.1 % Apply 1 application topically 4 (four) times daily. As needed for itching 45 g 11   No current facility-administered medications on file prior to visit.     Allergies:  Allergies  Allergen Reactions  . Quinapril     cough    Review of Systems:  CONSTITUTIONAL: No fevers, chills, night sweats, or weight loss.  EYES: No visual changes or eye pain ENT: No hearing changes.  No history of nose bleeds.   RESPIRATORY: No cough, wheezing and  shortness of breath.   CARDIOVASCULAR: Negative for chest pain, and palpitations.   GI: Negative for abdominal discomfort, blood in stools or black stools.  No recent change in bowel habits.   GU:  No history of incontinence.   MUSCLOSKELETAL: No history of joint pain or swelling.  No myalgias.   SKIN: Negative for lesions, rash, and itching.   ENDOCRINE: Negative for cold or heat intolerance, polydipsia or goiter.   PSYCH:  No depression or anxiety symptoms.   NEURO: As Above.   Vital Signs:  BP 110/80   Pulse 92   Ht 5\' 6"  (1.676 m)   Wt 235 lb 9 oz (106.9 kg)   SpO2 94%   BMI 38.02 kg/m   Neurological Exam: MENTAL STATUS including orientation to time, place, person, recent and remote memory, attention span and concentration, language, and fund of knowledge is normal.  Speech is not dysarthric.  CRANIAL NERVES: Pupils equal round and reactive to light.  Normal conjugate, extra-ocular eye movements in all directions of gaze.  No ptosis. Normal facial sensation.  Face is symmetric. Palate elevates symmetrically.  Tongue is midline.  MOTOR:  Motor strength is 5/5 in all extremities.  No atrophy, fasciculations or abnormal movements.  No pronator drift.  Tone is normal.    MSRs:  Right                                                                 Left brachioradialis 2+  brachioradialis 2+  biceps 2+  biceps 2+  triceps 2+  triceps 2+  patellar 2+  patellar 3+  ankle jerk 1+  ankle jerk 1+  Hoffman no  Hoffman no  plantar response down  plantar response down   SENSORY:  Intact to vibration.  COORDINATION/GAIT:  Normal finger-to- nose-finger and heel-to-shin.  Intact rapid alternating movements bilaterally.  Gait wide-based and stable.   Data: Lab Results  Component Value Date   CHOL 205 (H) 01/04/2014   HDL 50.00 01/04/2014   LDLCALC 122 (H) 01/04/2014   LDLDIRECT 126.7 03/24/2013   TRIG 167.0 (H) 01/04/2014   CHOLHDL 4 01/04/2014   Lab Results  Component Value  Date   HGBA1C 8.3 10/03/2015   MRI brain 04/2011: 1. Acute lacunar type infarcts in the right mid brain and pons. No mass effect or hemorrhage.  2. Superimposed chronic lacunar infarct in the right paracentral pons. These findings indicate acute on chronic small vessel ischemia.  3. Otherwise mild for age nonspecific cerebral white matter signal changes.  4. Intracranial MRA findings are below.  MRA brain 04/2011: 1. 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 No major branch occlusion. There is irregularity suggesting atherosclerosis  in the right proximal PCA.  2. Mild anterior circulation atherosclerosis. No stenosis or major branch occlusion.   IMPRESSION/PLAN: 1.  Muscle cramps and transient left leg electrical sensation.  Exam is non-focal except brisk left patella reflex, suggesting lumbar canal stenosis.  I will treat her symptomatically with flexeril 5mg  at bedtime and see if this helps.  If not improvement, plan on MRI lumbar spine as the next step.  Recommend posterior leg stretches.  Unlikely for cramps due to statin therapy since she has been on this for over a year.  Check Vitamin B12, TSH, magnesium.    2.  Right pontine stroke (2012, mild left hemiparesis, small vessel disease).  Continue secondary stroke prevention with plavix 75mg , pravastatin 20mg  daily, BP and diabetes control.  Her blood sugars are elevated and I recommended lifestyle changes.  She was requested a handicap parking decal, but there is no neurological indication that she would need this.  Upon further questioning, she states it is because she gets short of breath.  She will discuss this with her PCP.  Return to clinic in 4 months  The duration of this appointment visit was 25 minutes of face-to-face time with the patient.  Greater than 50% of this time was spent in counseling, explanation of diagnosis, planning of further management, and coordination of care.   Thank you for  allowing me to participate in patient's care.  If I can answer any additional questions, I would be pleased to do so.    Sincerely,    Dustyn Dansereau K. Posey Pronto, DO

## 2015-12-16 NOTE — Patient Instructions (Addendum)
1.  Start flexeril 5mg  at bedtime 2.  Call if your symptoms worsen and we can set up nerve conduction studies 3.  Start doing leg stretches at night time 4.  Check blood work 5.  Stay well hydrated  Return to clinic in 4 months

## 2016-01-10 ENCOUNTER — Ambulatory Visit (INDEPENDENT_AMBULATORY_CARE_PROVIDER_SITE_OTHER): Payer: BC Managed Care – PPO | Admitting: Internal Medicine

## 2016-01-10 ENCOUNTER — Other Ambulatory Visit (INDEPENDENT_AMBULATORY_CARE_PROVIDER_SITE_OTHER): Payer: BC Managed Care – PPO

## 2016-01-10 ENCOUNTER — Encounter: Payer: Self-pay | Admitting: Internal Medicine

## 2016-01-10 VITALS — BP 134/74 | HR 100 | Temp 98.3°F | Resp 16 | Ht 66.0 in | Wt 238.0 lb

## 2016-01-10 DIAGNOSIS — Z114 Encounter for screening for human immunodeficiency virus [HIV]: Secondary | ICD-10-CM

## 2016-01-10 DIAGNOSIS — E1151 Type 2 diabetes mellitus with diabetic peripheral angiopathy without gangrene: Secondary | ICD-10-CM

## 2016-01-10 DIAGNOSIS — Z1159 Encounter for screening for other viral diseases: Secondary | ICD-10-CM

## 2016-01-10 DIAGNOSIS — I10 Essential (primary) hypertension: Secondary | ICD-10-CM

## 2016-01-10 DIAGNOSIS — Z794 Long term (current) use of insulin: Secondary | ICD-10-CM

## 2016-01-10 DIAGNOSIS — E785 Hyperlipidemia, unspecified: Secondary | ICD-10-CM

## 2016-01-10 DIAGNOSIS — J4520 Mild intermittent asthma, uncomplicated: Secondary | ICD-10-CM

## 2016-01-10 LAB — CBC WITH DIFFERENTIAL/PLATELET
BASOS PCT: 0.3 % (ref 0.0–3.0)
Basophils Absolute: 0 10*3/uL (ref 0.0–0.1)
Eosinophils Absolute: 0.4 10*3/uL (ref 0.0–0.7)
Eosinophils Relative: 3.2 % (ref 0.0–5.0)
HEMATOCRIT: 41.2 % (ref 36.0–46.0)
Hemoglobin: 13.7 g/dL (ref 12.0–15.0)
LYMPHS ABS: 3.2 10*3/uL (ref 0.7–4.0)
LYMPHS PCT: 23.5 % (ref 12.0–46.0)
MCHC: 33.3 g/dL (ref 30.0–36.0)
MCV: 80.8 fl (ref 78.0–100.0)
MONOS PCT: 5.9 % (ref 3.0–12.0)
Monocytes Absolute: 0.8 10*3/uL (ref 0.1–1.0)
NEUTROS ABS: 9.1 10*3/uL — AB (ref 1.4–7.7)
NEUTROS PCT: 67.1 % (ref 43.0–77.0)
PLATELETS: 329 10*3/uL (ref 150.0–400.0)
RBC: 5.1 Mil/uL (ref 3.87–5.11)
RDW: 14.1 % (ref 11.5–15.5)
WBC: 13.5 10*3/uL — ABNORMAL HIGH (ref 4.0–10.5)

## 2016-01-10 LAB — COMPREHENSIVE METABOLIC PANEL
ALT: 16 U/L (ref 0–35)
AST: 11 U/L (ref 0–37)
Albumin: 3.9 g/dL (ref 3.5–5.2)
Alkaline Phosphatase: 73 U/L (ref 39–117)
BILIRUBIN TOTAL: 0.5 mg/dL (ref 0.2–1.2)
BUN: 17 mg/dL (ref 6–23)
CALCIUM: 9.4 mg/dL (ref 8.4–10.5)
CHLORIDE: 100 meq/L (ref 96–112)
CO2: 30 meq/L (ref 19–32)
Creatinine, Ser: 0.85 mg/dL (ref 0.40–1.20)
GFR: 87.91 mL/min (ref >60.00)
GLUCOSE: 323 mg/dL — AB (ref 70–99)
Potassium: 4.2 mEq/L (ref 3.5–5.1)
Sodium: 137 mEq/L (ref 135–145)
Total Protein: 7.3 g/dL (ref 6.0–8.3)

## 2016-01-10 LAB — HEMOGLOBIN A1C: Hgb A1c MFr Bld: 9.3 % — ABNORMAL HIGH (ref 4.6–6.5)

## 2016-01-10 MED ORDER — BLOOD GLUCOSE MONITOR KIT: PACK | 0 refills | Status: DC

## 2016-01-10 MED ORDER — SPIRONOLACTONE 25 MG PO TABS: 12.5000 mg | ORAL_TABLET | Freq: Every day | ORAL | 1 refills | Status: DC

## 2016-01-10 NOTE — Assessment & Plan Note (Signed)
Does not use symbicort Takes singulair daily Uses albuterol infrequently, at most twice a week when it is humid out Asthma is controlled Continue daily singulair Inhalers as needed

## 2016-01-10 NOTE — Assessment & Plan Note (Signed)
Blood pressure controlled Continue current medications Given mild edema will restart spironolactone, but only at 12.5 mg daily-this does cause increased urination, which she has not found out Discussed that we can revised medication if needed Check CMP

## 2016-01-10 NOTE — Progress Notes (Signed)
Pre visit review using our clinic review tool, if applicable. No additional management support is needed unless otherwise documented below in the visit note. 

## 2016-01-10 NOTE — Patient Instructions (Addendum)
  Test(s) ordered today. Your results will be released to [facility] (or called to you) after review, usually within 72hours after test completion. If any changes need to be made, you will be notified at that same time.  All other Health Maintenance issues reviewed.   All recommended immunizations and age-appropriate screenings are up-to-date or discussed.  No immunizations administered today.   Medications reviewed and updated.  Changes include decreasing the spironolactone to 12.5 mg daily  Your prescription(s) have been submitted to your pharmacy. Please take as directed and contact our office if you believe you are having problem(s) with the medication(s).   Please followup in 6 months for a physical.

## 2016-01-10 NOTE — Assessment & Plan Note (Signed)
Sugar control improving Check A1c today since she is getting blood work Management per Dr. Loanne Drilling Stressed regular exercise and weight loss

## 2016-01-10 NOTE — Progress Notes (Signed)
Subjective:    Patient ID: Rebekah Kennedy, female    DOB: 1956/02/15, 60 y.o.   MRN: [id number]  HPI She is here to establish with a new pcp.  She is here for follow up.  Leg edema:  She has noticed leg swelling most days.  She has not taken the spironolactone in over a month - she needed to be seen here to get a refill.  Hypertension: She is taking her medication daily. She is compliant with a low sodium diet.  She denies chest pain, palpitations, shortness of breath and regular headaches. She is exercising irregularly.  She does monitor her blood pressure at home - it is well controlled 120/80 on average.    Diabetes: she follows with Dr Loanne Drilling.  She is taking her medication daily as prescribed. She is compliant with a diabetic diet. Her sugars have been improving.  She is exercising irregularly. She monitors her sugars and they have been running better, but does not remember specific numbers.  She is up to date with her eye exams.    Hyperlipidemia: She is taking her medication daily. She is compliant with a low fat/cholesterol diet. She is exercising irregularly. She denies myalgias.       Medications and allergies reviewed with patient and updated if appropriate.  Patient Active Problem List   Diagnosis Date Noted  . Diabetes ([place]) 07/19/2015  . Irritant dermatitis 01/29/2015  . History of colonic polyps 12/01/2014  . Change in bowel movement 12/01/2014  . Rotator cuff injury 01/27/2014  . Vaginal atrophy 10/22/2013  . Postmenopausal bleeding 10/05/2013  . Morbidly obese ([place]) 10/05/2013  . Partial nontraumatic tear of right rotator cuff 09/21/2013  . Cervical dysplasia   . Goiter 12/10/2011  . Asthma 12/10/2011  . 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 TIA (transient ischemic attack) 02/13/2011  . Fibroid   . PROTEINURIA, MILD 09/15/2009  . MUSCLE PAIN 07/22/2008  . DEPRESSION 03/16/2007  . PAIN IN JOINT, UNSPECIFIED SITE 01/23/2007  . HYPERLIPIDEMIA 12/01/2006   . HYPERTENSION 07/07/2006    Current Outpatient Prescriptions on File Prior to Visit  Medication Sig Dispense Refill  . albuterol (PROVENTIL HFA;VENTOLIN HFA) 108 (90 BASE) MCG/ACT inhaler Inhale 2 puffs into the lungs every 4 (four) hours as needed for wheezing or shortness of breath. 18 g 2  . amLODipine (NORVASC) 10 MG tablet TAKE 10 MG BY MOUTH ONE TIME DAILY Keep appt w/new PCP for future refills 90 tablet 0  . budesonide-formoterol (SYMBICORT) 160-4.5 MCG/ACT inhaler Inhale 2 puffs into the lungs 2 (two) times daily.    . clopidogrel (PLAVIX) 75 MG tablet Take 1 tablet (75 mg total) by mouth daily. 90 tablet 3  . cyclobenzaprine (FLEXERIL) 5 MG tablet Take 1 tablet (5 mg total) by mouth at bedtime as needed for muscle spasms. 30 tablet 5  . Insulin Glargine (BASAGLAR KWIKPEN) 100 UNIT/ML SOPN Inject 2.2 mLs (220 Units total) into the skin every morning. 90 mL 11  . Insulin Pen Needle 29G X 12MM MISC 1 Device by Does not apply route 2 (two) times daily. 100 each 11  . losartan (COZAAR) 100 MG tablet TAKE100 MG BY MOUTH ONE TIME DAILY Keep new appt w/new PCP for refills 90 tablet 0  . metoprolol succinate (TOPROL-XL) 100 MG 24 hr tablet Take 1 tablet (100 mg total) by mouth daily. Take with or immediately following a meal. 90 tablet 0  . montelukast (SINGULAIR) 10 MG tablet TAKE 10 MG BY MOUTH  NIGHTLY AT BEDTIME 30 tablet 2  . Multiple Vitamin (MULTIVITAMIN) tablet Take 1 tablet by mouth daily.      . ondansetron (ZOFRAN ODT) 8 MG disintegrating tablet Take 1 tablet (8 mg total) by mouth every 8 (eight) hours as needed for nausea or vomiting. 20 tablet 0  . pravastatin (PRAVACHOL) 20 MG tablet Take 1 tablet (20 mg total) by mouth daily. 90 tablet 3  . spironolactone (ALDACTONE) 25 MG tablet Take 25 mg by mouth 2 (two) times daily.    . traMADol (ULTRAM) 50 MG tablet Take 1 tablet (50 mg total) by mouth every 6 (six) hours as needed. 10 tablet 0  . triamcinolone cream (KENALOG) 0.1 % Apply  1 application topically 4 (four) times daily. As needed for itching 45 g 11   No current facility-administered medications on file prior to visit.     Past Medical History:  Diagnosis Date  . ASTHMA NOS W/ACUTE EXACERBATION 07/10/2010  . Cataract   . Cervical dysplasia   . CHEST PAIN 01/23/2007  . DEPRESSION 03/16/2007  . DIABETES MELLITUS, TYPE II 12/01/2006  . Dysmenorrhea   . Fibroid   . HYPERLIPIDEMIA 12/01/2006  . HYPERTENSION 07/07/2006  . Obesity   . Splenomegaly    in college  . 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 TIA (transient ischemic attack) 2012    Past Surgical History:  Procedure Laterality Date  . 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 APPENDECTOMY    . BIOPSY THYROID  05/02/11   Nonneoplastic goiter  . BREAST SURGERY     Reduction  . COLPOSCOPY    . DILATION AND CURETTAGE OF UTERUS  1975   DUB  . EYE SURGERY    . GYNECOLOGIC CRYOSURGERY    . MYOMECTOMY    . OVARIAN CYST REMOVAL    . PELVIC LAPAROSCOPY  75,88   DL lysis of adhesions    Social History   Social History  . Marital status: Single    Spouse name: N/A  . Number of children: 2  . Years of education: N/A   Occupational History  . [employer]   Social History Main Topics  . Smoking status: Never Smoker  . Smokeless tobacco: Never Used  . Alcohol use No     Comment: very rare  . Drug use: No  . Sexual activity: Not Currently    Birth control/ protection: Post-menopausal   Other Topics Concern  . 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 Cancer Maternal Grandmother     Colon Cancer  . Asthma Maternal Grandmother   . Diabetes Father   . Heart disease Father   . Hypertension Father   . Hyperlipidemia Father   . Hypertension Mother   . Heart disease Mother   . Ovarian cancer Mother   . Cancer  Mother     Lung cancer  . Asthma Mother   . COPD Mother   . Hyperlipidemia Mother   . Diabetes Brother   . Hypertension Brother   . Kidney disease Brother   . Asthma Brother   . Heart disease Brother   . Hyperlipidemia Brother   . Graves' disease Sister   . Diabetes Sister   . Graves' disease Paternal Grandmother   .  Hypertension Paternal Grandmother   . Heart disease Paternal Grandmother   . Alzheimer's disease Paternal Grandmother   . Cancer Maternal Grandfather     Review of Systems  Constitutional: Negative for chills and fever.  Respiratory: Negative for cough, shortness of breath and wheezing.   Cardiovascular: Positive for leg swelling. Negative for chest pain and palpitations.  Gastrointestinal: Negative for abdominal pain.       No gerd  Neurological: Negative for light-headedness, numbness and headaches.  Psychiatric/Behavioral: Negative for dysphoric mood. The patient is not nervous/anxious.        Objective:   Vitals:   01/10/16 1519  BP: 134/74  Pulse: 100  Resp: 16  Temp: 98.3 F (36.8 C)   Filed Weights   01/10/16 1519  Weight: 238 lb (108 kg)   Body mass index is 38.41 kg/m.   Physical Exam Constitutional: Appears well-developed and well-nourished. No distress.  HENT:  Head: Normocephalic and atraumatic.  Neck: Neck supple. No tracheal deviation present. No thyromegaly present.  Cardiovascular: Normal rate, regular rhythm and normal heart sounds.   No murmur heard. No carotid bruit  Pulmonary/Chest: Effort normal and breath sounds normal. No respiratory distress. No has no wheezes. No rales.  Musculoskeletal: trace edema.  Lymphadenopathy: No cervical adenopathy.  Skin: Skin is warm and dry. Not diaphoretic.  Psychiatric: Normal mood and affect. Behavior is normal.         Assessment & Plan:   See Problem List for Assessment and Plan of chronic medical problems.   F/u in 6 months

## 2016-01-10 NOTE — Assessment & Plan Note (Signed)
Taking pravastatin daily Is not fasting - will not check lipid panel today Work on weight loss and increasing exercise

## 2016-01-10 NOTE — Assessment & Plan Note (Signed)
With diabetes, hypertension, hyperlipidemia Advised regular exercise and weight loss Decrease portions

## 2016-01-11 LAB — HIV ANTIBODY (ROUTINE TESTING W REFLEX): HIV 1&2 Ab, 4th Generation: NONREACTIVE

## 2016-01-11 LAB — HEPATITIS C ANTIBODY: HCV Ab: NEGATIVE

## 2016-01-14 ENCOUNTER — Encounter: Payer: Self-pay | Admitting: Internal Medicine

## 2016-02-03 ENCOUNTER — Ambulatory Visit (INDEPENDENT_AMBULATORY_CARE_PROVIDER_SITE_OTHER): Payer: BC Managed Care – PPO | Admitting: Endocrinology

## 2016-02-03 VITALS — BP 140/84 | HR 87 | Wt 239.0 lb

## 2016-02-03 DIAGNOSIS — E119 Type 2 diabetes mellitus without complications: Secondary | ICD-10-CM | POA: Diagnosis not present

## 2016-02-03 DIAGNOSIS — Z794 Long term (current) use of insulin: Secondary | ICD-10-CM | POA: Diagnosis not present

## 2016-02-03 DIAGNOSIS — Z23 Encounter for immunization: Secondary | ICD-10-CM

## 2016-02-03 DIAGNOSIS — E669 Obesity, unspecified: Secondary | ICD-10-CM | POA: Diagnosis not present

## 2016-02-03 MED ORDER — BASAGLAR KWIKPEN 100 UNIT/ML ~~LOC~~ SOPN: 270.0000 [IU] | PEN_INJECTOR | SUBCUTANEOUS | 11 refills | Status: DC

## 2016-02-03 NOTE — Progress Notes (Signed)
Subjective:    Patient ID: Rebekah Kennedy, female    DOB: 1956-04-21, 60 y.o.   MRN: [id number]  HPI Pt returns for f/u of diabetes mellitus: DM type: Insulin-requiring type 2 Dx'ed: 7619 Complications: CVA, nephropathy, and retinopathy.  Therapy: insulin since 2013 GDM: never DKA: never Severe hypoglycemia: never. Pancreatitis: never Other: due to noncompliance with insulin injections and cbg monitoring, she is on a qd insulin regimen.   Interval history: Pt says she misses the insulin less than once per month  She takes 220 units qam.  no cbg record, but states cbg's vary from 130-300's.  pt states she feels well in general.   Past Medical History:  Diagnosis Date  . ASTHMA NOS W/ACUTE EXACERBATION 07/10/2010  . Cataract   . Cervical dysplasia   . CHEST PAIN 01/23/2007  . DEPRESSION 03/16/2007  . DIABETES MELLITUS, TYPE II 12/01/2006  . Dysmenorrhea   . Fibroid   . HYPERLIPIDEMIA 12/01/2006  . HYPERTENSION 07/07/2006  . Obesity   . Splenomegaly    in college  . 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 TIA (transient ischemic attack) 2012    Past Surgical History:  Procedure Laterality Date  . 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 APPENDECTOMY    . BIOPSY THYROID  05/02/11   Nonneoplastic goiter  . BREAST SURGERY     Reduction  . COLPOSCOPY    . DILATION AND CURETTAGE OF UTERUS  1975   DUB  . EYE SURGERY    . GYNECOLOGIC CRYOSURGERY    . MYOMECTOMY    . OVARIAN CYST REMOVAL    . PELVIC LAPAROSCOPY  75,88   DL lysis of adhesions    Social History   Social History  . Marital status: Single    Spouse name: N/A  . Number of children: 2  . Years of education: N/A   Occupational History  . [place] [employer]   Social History Main Topics  . Smoking status: Never Smoker  . Smokeless tobacco: Never Used  . Alcohol use No     Comment: very rare  . Drug use: No  . Sexual activity: Not Currently    Birth control/ protection:  Post-menopausal   Other Topics Concern  . 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 albuterol (PROVENTIL HFA;VENTOLIN HFA) 108 (90 BASE) MCG/ACT inhaler Inhale 2 puffs into the lungs every 4 (four) hours as needed for wheezing or shortness of breath. 18 g 2  . amLODipine (NORVASC) 10 MG tablet TAKE 10 MG BY MOUTH ONE TIME DAILY Keep appt w/new PCP for future refills 90 tablet 0  . blood glucose meter kit and supplies KIT One touch verio. Use up to four times daily as directed. (FOR ICD-9 250.00, 250.01). 1 each 0  . budesonide-formoterol (SYMBICORT) 160-4.5 MCG/ACT inhaler Inhale 2 puffs into the lungs 2 (two) times daily.    . clopidogrel (PLAVIX) 75 MG tablet Take 1 tablet (75 mg total) by mouth daily. 90 tablet 3  . cyclobenzaprine (FLEXERIL) 5 MG tablet Take 1 tablet (5 mg total) by mouth at bedtime as needed for muscle spasms. 30 tablet 5  . Insulin Pen Needle 29G X 12MM MISC 1 Device  by Does not apply route 2 (two) times daily. 100 each 11  . losartan (COZAAR) 100 MG tablet TAKE100 MG BY MOUTH ONE TIME DAILY Keep new appt w/new PCP for refills 90 tablet 0  . metoprolol succinate (TOPROL-XL) 100 MG 24 hr tablet Take 1 tablet (100 mg total) by mouth daily. Take with or immediately following a meal. 90 tablet 0  . montelukast (SINGULAIR) 10 MG tablet TAKE 10 MG BY MOUTH NIGHTLY AT BEDTIME 30 tablet 2  . Multiple Vitamin (MULTIVITAMIN) tablet Take 1 tablet by mouth daily.      . pravastatin (PRAVACHOL) 20 MG tablet Take 1 tablet (20 mg total) by mouth daily. 90 tablet 3  . spironolactone (ALDACTONE) 25 MG tablet Take 0.5 tablets (12.5 mg total) by mouth daily. 90 tablet 1  . triamcinolone cream (KENALOG) 0.1 % Apply 1 application topically 4 (four) times daily. As needed for itching 45 g 11   No  current facility-administered medications on file prior to visit.     Allergies  Allergen Reactions  . Quinapril     cough    Family History  Problem Relation Age of Onset  . Cancer Maternal Grandmother     Colon Cancer  . Asthma Maternal Grandmother   . Diabetes Father   . Heart disease Father   . Hypertension Father   . Hyperlipidemia Father   . Hypertension Mother   . Heart disease Mother   . Ovarian cancer Mother   . Cancer Mother     Lung cancer  . Asthma Mother   . COPD Mother   . Hyperlipidemia Mother   . Diabetes Brother   . Hypertension Brother   . Kidney disease Brother   . Asthma Brother   . Heart disease Brother   . Hyperlipidemia Brother   . Graves' disease Sister   . Diabetes Sister   . Graves' disease Paternal Grandmother   . Hypertension Paternal Grandmother   . Heart disease Paternal Grandmother   . Alzheimer's disease Paternal Grandmother   . Cancer Maternal Grandfather     BP 140/84   Pulse 87   Wt 239 lb (108.4 kg)   SpO2 96%   BMI 38.58 kg/m    Review of Systems She denies hypoglycemia.      Objective:   Physical Exam VITAL SIGNS:  See vs page GENERAL: no distress Pulses: dorsalis pedis intact bilat.   MSK: no deformity of the feet CV: trace bilat leg edema. Skin:  no ulcer on the feet.  normal color and temp on the feet.  Neuro: sensation is intact to touch on the feet.  Ext: There is bilateral onychomycosis of the toenails.   Lab Results  Component Value Date   HGBA1C 9.3 (H) 01/10/2016      Assessment & Plan:  DM: worse, prob due to variable dosing.   Obesity: persistent

## 2016-02-03 NOTE — Patient Instructions (Addendum)
On this type of insulin schedule, you should eat meals on a regular schedule.  If a meal is missed or significantly delayed, your blood sugar could go low. Please increase the insulin to 270 units each morning.   Please continue to pursue the weight loss surgery.  I'll do the letter and form.   check your blood sugar 2 times a day.  vary the time of day when you check, between before the 3 meals, and at bedtime.  also check if you have symptoms of your blood sugar being too high or too low.  please keep a record of the readings and bring it to your next appointment here.  please call us sooner if you are having low blood sugar episodes, or if it stays over 200.   Please come back for a follow-up appointment in 2 months.

## 2016-03-06 ENCOUNTER — Other Ambulatory Visit: Payer: Self-pay | Admitting: Internal Medicine

## 2016-03-27 ENCOUNTER — Encounter: Payer: Self-pay | Admitting: Internal Medicine

## 2016-04-04 ENCOUNTER — Encounter: Payer: Self-pay | Admitting: Endocrinology

## 2016-04-04 ENCOUNTER — Ambulatory Visit (INDEPENDENT_AMBULATORY_CARE_PROVIDER_SITE_OTHER): Payer: BC Managed Care – PPO | Admitting: Endocrinology

## 2016-04-04 VITALS — BP 146/86 | HR 103 | Ht 66.0 in | Wt 249.0 lb

## 2016-04-04 DIAGNOSIS — E1151 Type 2 diabetes mellitus with diabetic peripheral angiopathy without gangrene: Secondary | ICD-10-CM | POA: Diagnosis not present

## 2016-04-04 DIAGNOSIS — Z794 Long term (current) use of insulin: Secondary | ICD-10-CM

## 2016-04-04 MED ORDER — BASAGLAR KWIKPEN 100 UNIT/ML ~~LOC~~ SOPN: 300.0000 [IU] | PEN_INJECTOR | SUBCUTANEOUS | 11 refills | Status: DC

## 2016-04-04 NOTE — Progress Notes (Signed)
Subjective:    Patient ID: Rebekah Kennedy, female    DOB: Sep 16, 1955, 60 y.o.   MRN: [id number]  HPI Pt returns for f/u of diabetes mellitus: DM type: Insulin-requiring type 2 Dx'ed: 9562 Complications: CVA, nephropathy, and retinopathy.  Therapy: insulin since 2013 GDM: never DKA: never Severe hypoglycemia: never. Pancreatitis: never Other: due to noncompliance with insulin injections and cbg monitoring, she is on a qd insulin regimen.   Interval history: Pt says she misses the insulin less than once per month  She takes 220 units qam.  no cbg record, but states cbg's vary from 120-400.  pt states she feels well in general.  She says the biggest problem she has with diet is eating chips.   Past Medical History:  Diagnosis Date  . ASTHMA NOS W/ACUTE EXACERBATION 07/10/2010  . Cataract   . Cervical dysplasia   . CHEST PAIN 01/23/2007  . DEPRESSION 03/16/2007  . DIABETES MELLITUS, TYPE II 12/01/2006  . Dysmenorrhea   . Fibroid   . HYPERLIPIDEMIA 12/01/2006  . HYPERTENSION 07/07/2006  . Obesity   . Splenomegaly    in college  . 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 TIA (transient ischemic attack) 2012    Past Surgical History:  Procedure Laterality Date  . 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 APPENDECTOMY    . BIOPSY THYROID  05/02/11   Nonneoplastic goiter  . BREAST SURGERY     Reduction  . COLPOSCOPY    . DILATION AND CURETTAGE OF UTERUS  1975   DUB  . EYE SURGERY    . GYNECOLOGIC CRYOSURGERY    . MYOMECTOMY    . OVARIAN CYST REMOVAL    . PELVIC LAPAROSCOPY  75,88   DL lysis of adhesions    Social History   Social History  . Marital status: Single    Spouse name: N/A  . Number of children: 2  . Years of education: N/A   Occupational History  . [employer]   Social History Main Topics  . Smoking status: Never Smoker  . Smokeless tobacco: Never Used  . Alcohol use No     Comment: very rare  . Drug use: No  .  Sexual activity: Not Currently    Birth control/ protection: Post-menopausal   Other Topics Concern  . 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 albuterol (PROVENTIL HFA;VENTOLIN HFA) 108 (90 BASE) MCG/ACT inhaler Inhale 2 puffs into the lungs every 4 (four) hours as needed for wheezing or shortness of breath. 18 g 2  . amLODipine (NORVASC) 10 MG tablet TAKE 10 MG BY MOUTH ONE TIME DAILY Keep appt w/new PCP for future refills 90 tablet 0  . blood glucose meter kit and supplies KIT One touch verio. Use up to four times daily as directed. (FOR ICD-9 250.00, 250.01). 1 each 0  . budesonide-formoterol (SYMBICORT) 160-4.5 MCG/ACT inhaler Inhale 2 puffs into the lungs 2 (two) times daily.    . clopidogrel (PLAVIX) 75 MG tablet Take 1 tablet (75 mg total) by mouth daily. 90 tablet 3  . cyclobenzaprine (FLEXERIL) 5 MG tablet Take 1 tablet (5 mg total) by mouth at bedtime as needed for muscle spasms. [place]  tablet 5  . Insulin Pen Needle 29G X 12MM MISC 1 Device by Does not apply route 2 (two) times daily. 100 each 11  . losartan (COZAAR) 100 MG tablet TAKE100 MG BY MOUTH ONE TIME DAILY Keep new appt w/new PCP for refills 90 tablet 0  . metoprolol succinate (TOPROL-XL) 100 MG 24 hr tablet Take 1 tablet (100 mg total) by mouth daily. Take with or immediately following a meal. 90 tablet 0  . montelukast (SINGULAIR) 10 MG tablet TAKE 1 TABLET BY MOUTH AT BEDTIME 30 tablet 5  . Multiple Vitamin (MULTIVITAMIN) tablet Take 1 tablet by mouth daily.      . pravastatin (PRAVACHOL) 20 MG tablet Take 1 tablet (20 mg total) by mouth daily. 90 tablet 3  . spironolactone (ALDACTONE) 25 MG tablet Take 0.5 tablets (12.5 mg total) by mouth daily. 90 tablet 1  . triamcinolone cream (KENALOG) 0.1 % Apply 1 application topically 4 (four)  times daily. As needed for itching 45 g 11   No current facility-administered medications on file prior to visit.     Allergies  Allergen Reactions  . Quinapril     cough    Family History  Problem Relation Age of Onset  . Cancer Maternal Grandmother     Colon Cancer  . Asthma Maternal Grandmother   . Diabetes Father   . Heart disease Father   . Hypertension Father   . Hyperlipidemia Father   . Hypertension Mother   . Heart disease Mother   . Ovarian cancer Mother   . Cancer Mother     Lung cancer  . Asthma Mother   . COPD Mother   . Hyperlipidemia Mother   . Diabetes Brother   . Hypertension Brother   . Kidney disease Brother   . Asthma Brother   . Heart disease Brother   . Hyperlipidemia Brother   . Graves' disease Sister   . Diabetes Sister   . Graves' disease Paternal Grandmother   . Hypertension Paternal Grandmother   . Heart disease Paternal Grandmother   . Alzheimer's disease Paternal Grandmother   . Cancer Maternal Grandfather     BP (!) 146/86   Pulse (!) 103   Ht _0  (1.676 m)   Wt 249 lb (112.9 kg)   SpO2 97%   BMI 40.19 kg/m    Review of Systems She denies hypoglycemia.    Objective:   Physical Exam VITAL SIGNS:  See vs page GENERAL: no distress Pulses: dorsalis pedis intact bilat.   MSK: no deformity of the feet CV: 1+ bilat leg edema. Skin:  no ulcer on the feet.  normal color and temp on the feet.  Neuro: sensation is intact to touch on the feet.  Ext: There is bilateral onychomycosis of the toenails.   A1c=8.5% Lab Results  Component Value Date   CREATININE 0.85 01/10/2016   BUN 17 01/10/2016   NA 137 01/10/2016   K 4.2 01/10/2016   CL 100 01/10/2016   CO2 30 01/10/2016      Assessment & Plan:  Insulin-requiring type 2 DM, with CVA: worse Obesity: persistent  Patient is advised the following: Patient Instructions  On this type of insulin schedule, you should eat meals on a regular schedule.  If a meal is missed or  significantly delayed, your blood sugar could go low. Please increase the insulin to 300 units each morning, and: Continue working toward the weight loss surgery. check your blood sugar 2 times a day.  vary the time of day when you check, between before the 3 meals, and at bedtime.  also check if you have symptoms of your blood sugar being too high or too low.  please keep a record of the readings and bring it to your next appointment here.  please call us sooner if you are having low blood sugar episodes, or if it stays over 200.   Please come back for a follow-up appointment in 3 months.        Diabetes Mellitus and Food It is important for you to manage your blood sugar (glucose) level. Your blood glucose level can be greatly affected by what you eat. Eating healthier foods in the appropriate amounts throughout the day at about the same time each day will help you control your blood glucose level. It can also help slow or prevent worsening of your diabetes mellitus. Healthy eating may even help you improve the level of your blood pressure and reach or maintain a healthy weight. General recommendations for healthful eating and cooking habits include:  Eating meals and snacks regularly. Avoid going long periods of time without eating to lose weight.  Eating a diet that consists mainly of plant-based foods, such as fruits, vegetables, nuts, legumes, and whole grains.  Using low-heat cooking methods, such as baking, instead of high-heat cooking methods, such as deep frying. Work with your dietitian to make sure you understand how to use the Nutrition Facts information on food labels. How can food affect me? Carbohydrates  Carbohydrates affect your blood glucose level more than any other type of food. Your dietitian will help you determine how many carbohydrates to eat at each meal and teach you how to count carbohydrates. Counting carbohydrates is important to keep your blood glucose at a  healthy level, especially if you are using insulin or taking certain medicines for diabetes mellitus. Alcohol  Alcohol can cause sudden decreases in blood glucose (hypoglycemia), especially if you use insulin or take certain medicines for diabetes mellitus. Hypoglycemia can be a life-threatening condition. Symptoms of hypoglycemia (sleepiness, dizziness, and disorientation) are similar to symptoms of having too much alcohol. If your health care provider has given you approval to drink alcohol, do so in moderation and use the following guidelines:  Women should not have more than one drink per day, and men should not have more than two drinks per day. One drink is equal to:  12 oz of beer.  5 oz of wine.  1 oz of hard liquor.  Do not drink on an empty stomach.  Keep yourself hydrated. Have water, diet soda, or unsweetened iced tea.  Regular soda, juice, and other mixers might contain a lot of carbohydrates and should be counted. What foods are not recommended? As you make food choices, it is important to remember that all foods are not the same. Some foods have fewer nutrients per serving than other foods, even though they might have the same number of calories or carbohydrates. It is difficult to get your body what it needs when you eat foods with fewer nutrients. Examples of foods that you should avoid that are high in calories and carbohydrates but low in nutrients include:  Trans fats (most processed foods list trans fats on the Nutrition Facts label).  Regular soda.  Juice.  Candy.  Sweets, such as cake, pie, doughnuts, and cookies.  Fried foods. What foods can I eat? Eat nutrient-rich foods, which will nourish your body and keep you healthy. The food you  should eat also will depend on several factors, including:  The calories you need.  The medicines you take.  Your weight.  Your blood glucose level.  Your blood pressure level.  Your cholesterol level. You should  eat a variety of foods, including:  Protein.  Lean cuts of meat.  Proteins low in saturated fats, such as fish, egg whites, and beans. Avoid processed meats.  Fruits and vegetables.  Fruits and vegetables that may help control blood glucose levels, such as apples, mangoes, and yams.  Dairy products.  Choose fat-free or low-fat dairy products, such as milk, yogurt, and cheese.  Grains, bread, pasta, and rice.  Choose whole grain products, such as multigrain bread, whole oats, and brown rice. These foods may help control blood pressure.  Fats.  Foods containing healthful fats, such as nuts, avocado, olive oil, canola oil, and fish. Does everyone with diabetes mellitus have the same meal plan? Because every person with diabetes mellitus is different, there is not one meal plan that works for everyone. It is very important that you meet with a dietitian who will help you create a meal plan that is just right for you. This information is not intended to replace advice given to you by your health care provider. Make sure you discuss any questions you have with your health care provider. Document Released: 02/01/2005 Document Revised: 10/13/2015 Document Reviewed: 04/03/2013 Elsevier Interactive Patient Education  2017 [employer].

## 2016-04-04 NOTE — Patient Instructions (Addendum)
On this type of insulin schedule, you should eat meals on a regular schedule.  If a meal is missed or significantly delayed, your blood sugar could go low. Please increase the insulin to 300 units each morning, and: Continue working toward the weight loss surgery. check your blood sugar 2 times a day.  vary the time of day when you check, between before the 3 meals, and at bedtime.  also check if you have symptoms of your blood sugar being too high or too low.  please keep a record of the readings and bring it to your next appointment here.  please call us sooner if you are having low blood sugar episodes, or if it stays over 200.   Please come back for a follow-up appointment in 3 months.        Diabetes Mellitus and Food It is important for you to manage your blood sugar (glucose) level. Your blood glucose level can be greatly affected by what you eat. Eating healthier foods in the appropriate amounts throughout the day at about the same time each day will help you control your blood glucose level. It can also help slow or prevent worsening of your diabetes mellitus. Healthy eating may even help you improve the level of your blood pressure and reach or maintain a healthy weight. General recommendations for healthful eating and cooking habits include:  Eating meals and snacks regularly. Avoid going long periods of time without eating to lose weight.  Eating a diet that consists mainly of plant-based foods, such as fruits, vegetables, nuts, legumes, and whole grains.  Using low-heat cooking methods, such as baking, instead of high-heat cooking methods, such as deep frying. Work with your dietitian to make sure you understand how to use the Nutrition Facts information on food labels. How can food affect me? Carbohydrates  Carbohydrates affect your blood glucose level more than any other type of food. Your dietitian will help you determine how many carbohydrates to eat at each meal and teach you  how to count carbohydrates. Counting carbohydrates is important to keep your blood glucose at a healthy level, especially if you are using insulin or taking certain medicines for diabetes mellitus. Alcohol  Alcohol can cause sudden decreases in blood glucose (hypoglycemia), especially if you use insulin or take certain medicines for diabetes mellitus. Hypoglycemia can be a life-threatening condition. Symptoms of hypoglycemia (sleepiness, dizziness, and disorientation) are similar to symptoms of having too much alcohol. If your health care provider has given you approval to drink alcohol, do so in moderation and use the following guidelines:  Women should not have more than one drink per day, and men should not have more than two drinks per day. One drink is equal to:  12 oz of beer.  5 oz of wine.  1 oz of hard liquor.  Do not drink on an empty stomach.  Keep yourself hydrated. Have water, diet soda, or unsweetened iced tea.  Regular soda, juice, and other mixers might contain a lot of carbohydrates and should be counted. What foods are not recommended? As you make food choices, it is important to remember that all foods are not the same. Some foods have fewer nutrients per serving than other foods, even though they might have the same number of calories or carbohydrates. It is difficult to get your body what it needs when you eat foods with fewer nutrients. Examples of foods that you should avoid that are high in calories and carbohydrates but low in  nutrients include:  Trans fats (most processed foods list trans fats on the Nutrition Facts label).  Regular soda.  Juice.  Candy.  Sweets, such as cake, pie, doughnuts, and cookies.  Fried foods. What foods can I eat? Eat nutrient-rich foods, which will nourish your body and keep you healthy. The food you should eat also will depend on several factors, including:  The calories you need.  The medicines you take.  Your  weight.  Your blood glucose level.  Your blood pressure level.  Your cholesterol level. You should eat a variety of foods, including:  Protein.  Lean cuts of meat.  Proteins low in saturated fats, such as fish, egg whites, and beans. Avoid processed meats.  Fruits and vegetables.  Fruits and vegetables that may help control blood glucose levels, such as apples, mangoes, and yams.  Dairy products.  Choose fat-free or low-fat dairy products, such as milk, yogurt, and cheese.  Grains, bread, pasta, and rice.  Choose whole grain products, such as multigrain bread, whole oats, and brown rice. These foods may help control blood pressure.  Fats.  Foods containing healthful fats, such as nuts, avocado, olive oil, canola oil, and fish. Does everyone with diabetes mellitus have the same meal plan? Because every person with diabetes mellitus is different, there is not one meal plan that works for everyone. It is very important that you meet with a dietitian who will help you create a meal plan that is just right for you. This information is not intended to replace advice given to you by your health care provider. Make sure you discuss any questions you have with your health care provider. Document Released: 02/01/2005 Document Revised: 10/13/2015 Document Reviewed: 04/03/2013 Elsevier Interactive Patient Education  2017 [employer].

## 2016-04-05 ENCOUNTER — Encounter: Payer: Self-pay | Admitting: Internal Medicine

## 2016-04-09 ENCOUNTER — Ambulatory Visit (INDEPENDENT_AMBULATORY_CARE_PROVIDER_SITE_OTHER): Payer: BC Managed Care – PPO | Admitting: Gynecology

## 2016-04-09 ENCOUNTER — Encounter: Payer: Self-pay | Admitting: Gynecology

## 2016-04-09 ENCOUNTER — Other Ambulatory Visit (HOSPITAL_COMMUNITY): Payer: Self-pay | Admitting: General Surgery

## 2016-04-09 VITALS — BP 124/80 | Ht 67.0 in | Wt 254.0 lb

## 2016-04-09 DIAGNOSIS — N952 Postmenopausal atrophic vaginitis: Secondary | ICD-10-CM

## 2016-04-09 DIAGNOSIS — Z01411 Encounter for gynecological examination (general) (routine) with abnormal findings: Secondary | ICD-10-CM

## 2016-04-09 NOTE — Patient Instructions (Signed)

## 2016-04-09 NOTE — Progress Notes (Signed)
    EMMELIE CHMELIK Sep 29, 1955 [id number]        60 y.o.  G0P0  for annual exam.  Doing well from a gynecologic standpoint.  Past medical history,surgical history, problem list, medications, allergies, family history and social history were all reviewed and documented as reviewed in the EPIC chart.  ROS:  Performed with pertinent positives and negatives included in the history, assessment and plan.   Additional significant findings :  None   Exam: [name] assistant Vitals:   04/09/16 1511  BP: 124/80  Weight: 254 lb (115.2 kg)  Height: 5\' 7"  (1.702 m)   Body mass index is 39.78 kg/m.  General appearance:  Normal affect, orientation and appearance. Skin: Grossly normal HEENT: Without gross lesions.  No cervical or supraclavicular adenopathy. Thyroid normal.  Lungs:  Clear without wheezing, rales or rhonchi Cardiac: RR, without RMG Abdominal:  Soft, nontender, without masses, guarding, rebound, organomegaly or hernia Breasts:  Examined lying and sitting without masses, retractions, discharge or axillary adenopathy. Pelvic:  Ext, BUS, Vagina with atrophic changes  Cervix with atrophic changes  Uterus difficult to palpate but no gross masses or tenderness  Adnexa without gross masses or tenderness    Anus and perineum normal   Rectovaginal normal sphincter tone without palpated masses or tenderness.    Assessment/Plan:  60 y.o. G0P0 female for annual exam.  1. Postmenopausal/atrophic genital changes. Without significant hot flushes, night sweats, vaginal dryness or any vaginal bleeding. Continue to monitor report any issues or bleeding.  2. Mammography last week. Had area that was biopsied this may that showed fibrocystic changes. Reports having follow up mammogram this past week or so. Do not have a copy of this report yet. Was recommended then to follow up next spring with bilateral screening mammography's. SBE monthly reviewed. 3. DEXA 2011 normal. Plan repeat DEXA next  year at age 20. Increased calcium vitamin D reviewed. 4. Colonoscopy 2009 with reported repeat interval 10 years. 5. Pap smear/HPV 2015. No Pap smear done today. History of cervical dysplasia with cryosurgery in 1990 with normal Pap smears afterwards. 6. Health maintenance. No routine lab work done as patient reports this done elsewhere. Follow up 1 year, sooner as needed.   Anastasio Auerbach MD, 3:51 PM 04/09/2016

## 2016-04-11 ENCOUNTER — Other Ambulatory Visit (HOSPITAL_BASED_OUTPATIENT_CLINIC_OR_DEPARTMENT_OTHER): Payer: Self-pay

## 2016-04-11 DIAGNOSIS — R0683 Snoring: Secondary | ICD-10-CM

## 2016-04-20 ENCOUNTER — Other Ambulatory Visit: Payer: Self-pay | Admitting: Internal Medicine

## 2016-04-20 DIAGNOSIS — I10 Essential (primary) hypertension: Secondary | ICD-10-CM

## 2016-04-20 DIAGNOSIS — R000 Tachycardia, unspecified: Secondary | ICD-10-CM

## 2016-04-30 ENCOUNTER — Other Ambulatory Visit: Payer: Self-pay

## 2016-04-30 ENCOUNTER — Ambulatory Visit (HOSPITAL_COMMUNITY)
Admission: RE | Admit: 2016-04-30 | Discharge: 2016-04-30 | Disposition: A | Discharge status: Home / Self Care | Payer: BC Managed Care – PPO | Source: Ambulatory Visit | Attending: General Surgery | Admitting: General Surgery

## 2016-04-30 ENCOUNTER — Other Ambulatory Visit (HOSPITAL_COMMUNITY): Payer: BC Managed Care – PPO

## 2016-04-30 DIAGNOSIS — Z01810 Encounter for preprocedural cardiovascular examination: Secondary | ICD-10-CM | POA: Insufficient documentation

## 2016-05-03 ENCOUNTER — Encounter: Payer: BC Managed Care – PPO | Attending: General Surgery | Admitting: Dietician

## 2016-05-03 DIAGNOSIS — Z9889 Other specified postprocedural states: Secondary | ICD-10-CM | POA: Insufficient documentation

## 2016-05-03 DIAGNOSIS — Z833 Family history of diabetes mellitus: Secondary | ICD-10-CM | POA: Diagnosis not present

## 2016-05-03 DIAGNOSIS — Z8673 Personal history of transient ischemic attack (TIA), and cerebral infarction without residual deficits: Secondary | ICD-10-CM | POA: Diagnosis not present

## 2016-05-03 DIAGNOSIS — Z79899 Other long term (current) drug therapy: Secondary | ICD-10-CM | POA: Diagnosis not present

## 2016-05-03 DIAGNOSIS — Z8249 Family history of ischemic heart disease and other diseases of the circulatory system: Secondary | ICD-10-CM | POA: Diagnosis not present

## 2016-05-03 DIAGNOSIS — Z713 Dietary counseling and surveillance: Secondary | ICD-10-CM | POA: Diagnosis not present

## 2016-05-03 DIAGNOSIS — Z825 Family history of asthma and other chronic lower respiratory diseases: Secondary | ICD-10-CM | POA: Insufficient documentation

## 2016-05-03 DIAGNOSIS — E119 Type 2 diabetes mellitus without complications: Secondary | ICD-10-CM | POA: Diagnosis not present

## 2016-05-03 DIAGNOSIS — Z8041 Family history of malignant neoplasm of ovary: Secondary | ICD-10-CM | POA: Insufficient documentation

## 2016-05-03 DIAGNOSIS — I10 Essential (primary) hypertension: Secondary | ICD-10-CM | POA: Diagnosis not present

## 2016-05-03 DIAGNOSIS — J45909 Unspecified asthma, uncomplicated: Secondary | ICD-10-CM | POA: Insufficient documentation

## 2016-05-03 DIAGNOSIS — Z8042 Family history of malignant neoplasm of prostate: Secondary | ICD-10-CM | POA: Diagnosis not present

## 2016-05-03 NOTE — Progress Notes (Signed)
  Pre-Op Assessment Visit:  Pre-Operative RYGB Surgery  Medical Nutrition Therapy:  Appt start time: 400   End time:  500.  Patient was seen on 05/03/2016 for Pre-Operative Nutrition Assessment. Assessment and letter of approval faxed to [facility] Bariatric Surgery Program coordinator on 05/03/2016.   Preferred Learning Style:   No preference indicated   Learning Readiness:   Ready  Handouts given during visit include:  Pre-Op Goals Bariatric Surgery Protein Shakes   During the appointment today the following Pre-Op Goals were reviewed with the patient: Maintain or lose weight as instructed by your surgeon Make healthy food choices Begin to limit portion sizes Limited concentrated sugars and fried foods Keep fat/sugar in the single digits per serving on   food labels Practice CHEWING your food  (aim for 30 chews per bite or until applesauce consistency) Practice not drinking 15 minutes before, during, and 30 minutes after each meal/snack Avoid all carbonated beverages  Avoid/limit caffeinated beverages  Avoid all sugar-sweetened beverages Consume 3 meals per day; eat every 3-5 hours Make a list of non-food related activities Aim for 64-100 ounces of FLUID daily  Aim for at least 60-80 grams of PROTEIN daily Look for a liquid protein source that contain ?15 g protein and ?5 g carbohydrate  (ex: shakes, drinks, shots)  Demonstrated degree of understanding via:  Teach Back  Teaching Method Utilized:  Visual Auditory Hands on  Barriers to learning/adherence to lifestyle change: none  [facility] to call the [facility] to enroll in Pre-Op and Post-Op Nutrition Education when surgery date is scheduled.

## 2016-05-07 ENCOUNTER — Ambulatory Visit: Payer: BC Managed Care – PPO | Admitting: Neurology

## 2016-05-11 ENCOUNTER — Ambulatory Visit (HOSPITAL_BASED_OUTPATIENT_CLINIC_OR_DEPARTMENT_OTHER): Payer: BC Managed Care – PPO | Attending: General Surgery

## 2016-05-16 ENCOUNTER — Encounter: Payer: BC Managed Care – PPO | Admitting: Dietician

## 2016-05-16 DIAGNOSIS — Z713 Dietary counseling and surveillance: Secondary | ICD-10-CM | POA: Diagnosis not present

## 2016-05-17 ENCOUNTER — Encounter: Payer: Self-pay | Admitting: Dietician

## 2016-05-17 NOTE — Progress Notes (Signed)
  Pre-Operative Nutrition Class:  Appt start time: 7670   End time:  1830.  Patient was seen on 05/16/2016 for Pre-Operative Bariatric Surgery Education at the [facility].   Surgery date:  Surgery type: RYGB Start weight at [facility]: 251 lbs 05/03/2016 Weight today: patient declined  TANITA  BODY COMP RESULTS  05/16/16   BMI (kg/m^2) n/a   Fat Mass (lbs)    Fat Free Mass (lbs)    Total Body Water (lbs)     Samples given per MNT protocol. Patient educated on appropriate usage: Bariatric Advantage Multivitamin (mixed fruit - qty 1) Lot #: [id number] Exp: 03/2017  Bariatric Advantage Calcium Citrate chew (raspberry - qty 1) Lot #: [id number] Exp: 11/2016  Premier protein shake (chocolate - qty 1) Lot #: [id number] Exp: 01/2017  The following the learning objectives were met by the patient during this course:  Identify Pre-Op Dietary Goals and will begin 2 weeks pre-operatively  Identify appropriate sources of fluids and proteins   State protein recommendations and appropriate sources pre and post-operatively  Identify Post-Operative Dietary Goals and will follow for 2 weeks post-operatively  Identify appropriate multivitamin and calcium sources  Describe the need for physical activity post-operatively and will follow MD recommendations  State when to call healthcare provider regarding medication questions or post-operative complications  Handouts given during class include:  Pre-Op Bariatric Surgery Diet Handout  Protein Shake Handout  Post-Op Bariatric Surgery Nutrition Handout  BELT Program Information Flyer  Support Group Information Flyer  [facility] Bariatric Supplements Price List  Follow-Up Plan: Patient will [facility] at [facility] 2 weeks post operatively for diet advancement per MD.

## 2016-06-11 ENCOUNTER — Encounter: Payer: Self-pay | Admitting: Pulmonary Disease

## 2016-06-11 ENCOUNTER — Ambulatory Visit (INDEPENDENT_AMBULATORY_CARE_PROVIDER_SITE_OTHER): Payer: BC Managed Care – PPO | Admitting: Pulmonary Disease

## 2016-06-11 DIAGNOSIS — J45909 Unspecified asthma, uncomplicated: Secondary | ICD-10-CM | POA: Diagnosis not present

## 2016-06-11 DIAGNOSIS — G4710 Hypersomnia, unspecified: Secondary | ICD-10-CM | POA: Diagnosis not present

## 2016-06-11 MED ORDER — ALBUTEROL SULFATE HFA 108 (90 BASE) MCG/ACT IN AERS: 2.0000 | INHALATION_SPRAY | RESPIRATORY_TRACT | 2 refills | Status: DC | PRN

## 2016-06-11 NOTE — Assessment & Plan Note (Signed)
Weight reduction 

## 2016-06-11 NOTE — Addendum Note (Signed)
Addended by: Benson Setting L on: 06/11/2016 03:11 PM   Modules accepted: Orders

## 2016-06-11 NOTE — Progress Notes (Signed)
Subjective:    Patient ID: Rebekah Kennedy, female    DOB: 08/29/1955, 61 y.o.   MRN: [id number]  HPI   This is the case of Rebekah Kennedy, 61 y.o. Female, who was referred by Dr. Gurney Maxin in consultation regarding possible OSA and pre-operative clearance for contemplated bariatric surgery.   As you very well know, patient is a non smoker.  She is diagnosed with asthma, all her life. Triggers: cigarette smoke, dust, change in weather, pollen. She is not on maintenance meds.  The cold weather has made her use alb 1x/week. Last ED visit for asthma was yrs ago.  Not been intubated for asthma.   She has snoring, occasional witnessed apneas, occasional gasping  Or choking. Sleeps 6 hrs / night. She teaches at [employer], employed by [employer] school.  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  (-) abnormal behavior in sleep.   ESS 13.   (-) abnormal behavior in school.    Review of Systems  Constitutional: Negative.  Negative for fever and unexpected weight change.  HENT: Negative.  Negative for congestion, dental problem, ear pain, nosebleeds, postnasal drip, rhinorrhea, sinus pressure, sneezing, sore throat and trouble swallowing.   Eyes: Negative.  Negative for redness and itching.  Respiratory: Positive for wheezing. Negative for cough, chest tightness and shortness of breath.   Cardiovascular: Positive for leg swelling. Negative for palpitations.  Gastrointestinal: Negative.  Negative for nausea and vomiting.  Genitourinary: Negative.  Negative for dysuria.  Musculoskeletal: Negative.  Negative for joint swelling.  Skin: Negative.  Negative for rash.  Neurological: Negative.  Negative for headaches.  Hematological: Negative.  Does not bruise/bleed easily.  Psychiatric/Behavioral: Negative.  Negative for dysphoric mood. The patient is not nervous/anxious.    Past Medical History:  Diagnosis Date  . ASTHMA NOS W/ACUTE EXACERBATION 07/10/2010  . Cataract    . Cervical dysplasia   . CHEST PAIN 01/23/2007  . DEPRESSION 03/16/2007  . DIABETES MELLITUS, TYPE II 12/01/2006  . Dysmenorrhea   . Fibroid   . HYPERLIPIDEMIA 12/01/2006  . HYPERTENSION 07/07/2006  . Obesity   . Splenomegaly    in college  . 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 TIA (transient ischemic attack) 2012   (-) CA, DVT  Family History  Problem Relation Age of Onset  . Cancer Maternal Grandmother     Colon Cancer  . Asthma Maternal Grandmother   . Diabetes Father   . Heart disease Father   . Hypertension Father   . Hyperlipidemia Father   . Hypertension Mother   . Heart disease Mother   . Ovarian cancer Mother   . Cancer Mother     Lung cancer  . Asthma Mother   . COPD Mother   . Hyperlipidemia Mother   . Diabetes Brother   . Hypertension Brother   . Kidney disease Brother   . Asthma Brother   . Heart disease Brother   . Hyperlipidemia Brother   . Graves' disease Sister   . Diabetes Sister   . Breast cancer Sister 66  . Graves' disease Paternal Grandmother   . Hypertension Paternal Grandmother   . Heart disease Paternal Grandmother   . Alzheimer's disease Paternal Grandmother   . Cancer Maternal Grandfather      Past Surgical History:  Procedure Laterality Date  . 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 APPENDECTOMY    . BIOPSY THYROID  05/02/11   Nonneoplastic  goiter  . BREAST BIOPSY    . BREAST SURGERY     Reduction  . COLPOSCOPY    . DILATION AND CURETTAGE OF UTERUS  1975   DUB  . EYE SURGERY    . GYNECOLOGIC CRYOSURGERY    . MYOMECTOMY    . OVARIAN CYST REMOVAL    . PELVIC LAPAROSCOPY  75,88   DL lysis of adhesions    Social History   Social History  . Marital status: Single    Spouse name: N/A  . Number of children: 2  . Years of education: N/A   Occupational History  . [employer]   Social History Main Topics  . Smoking status: Never Smoker  . Smokeless tobacco: Never Used  .  Alcohol use 0.0 oz/week     Comment: very rare  . Drug use: No  . Sexual activity: Not Currently    Birth control/ protection: Post-menopausal     Comment: 1st intercourse 61 yo-Fewer than 5 partners   Other Topics Concern  . 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 Quinapril     cough     Outpatient Medications Prior to Visit  Medication Sig Dispense Refill  . albuterol (PROVENTIL HFA;VENTOLIN HFA) 108 (90 BASE) MCG/ACT inhaler Inhale 2 puffs into the lungs every 4 (four) hours as needed for wheezing or shortness of breath. 18 g 2  . amLODipine (NORVASC) 10 MG tablet TAKE 10 MG BY MOUTH ONE TIME DAILY Keep appt w/new PCP for future refills 90 tablet 0  . blood glucose meter kit and supplies KIT One touch verio. Use up to four times daily as directed. (FOR ICD-9 250.00, 250.01). 1 each 0  . clopidogrel (PLAVIX) 75 MG tablet Take 1 tablet (75 mg total) by mouth daily. 90 tablet 3  . cyclobenzaprine (FLEXERIL) 5 MG tablet Take 1 tablet (5 mg total) by mouth at bedtime as needed for muscle spasms. 30 tablet 5  . Insulin Glargine (BASAGLAR KWIKPEN) 100 UNIT/ML SOPN Inject 3 mLs (300 Units total) into the skin every morning. 105 mL 11  . Insulin Pen Needle 29G X 12MM MISC 1 Device by Does not apply route 2 (two) times daily. 100 each 11  . losartan (COZAAR) 100 MG tablet TAKE100 MG BY MOUTH ONE TIME DAILY Keep new appt w/new PCP for refills 90 tablet 0  . metoprolol succinate (TOPROL-XL) 100 MG 24 hr tablet TAKE 1 TABLET BY MOUTH DAILY WITH OR IMMEDIATELY FOLLOWING A MEAL 90 tablet 2  . montelukast (SINGULAIR) 10 MG tablet TAKE 1 TABLET BY MOUTH AT BEDTIME 30 tablet 5  . Multiple Vitamin (MULTIVITAMIN) tablet Take 1 tablet by mouth daily.      . pravastatin (PRAVACHOL) 20 MG tablet Take 1 tablet (20 mg total) by mouth daily. 90 tablet 3  . spironolactone  (ALDACTONE) 25 MG tablet Take 0.5 tablets (12.5 mg total) by mouth daily. 90 tablet 1  . triamcinolone cream (KENALOG) 0.1 % Apply 1 application topically 4 (four) times daily. As needed for itching 45 g 11  . budesonide-formoterol (SYMBICORT) 160-4.5 MCG/ACT inhaler Inhale 2 puffs into the lungs 2 (two) times daily.     No facility-administered medications prior to visit.    Meds ordered this encounter  Medications  . Cod Liver Oil  1000 MG CAPS    Sig: Take 1 capsule by mouth daily.         Objective:   Physical Exam  Vitals:  Vitals:   06/11/16 1407  BP: (!) 152/90  Pulse: 96  SpO2: 99%  Weight: 246 lb (111.6 kg)  Height: 5\' 5"  (1.651 m)    Constitutional/General:  Pleasant, well-nourished, well-developed, not in any distress,  Comfortably seating.  Well kempt  Body mass index is 40.94 kg/m. Wt Readings from Last 3 Encounters:  06/11/16 246 lb (111.6 kg)  05/03/16 251 lb 4.8 oz (114 kg)  04/09/16 254 lb (115.2 kg)   HEENT: Pupils equal and reactive to light and accommodation. Anicteric sclerae. Normal nasal mucosa.   No oral  lesions,  mouth clear,  oropharynx clear, no postnasal drip. (-) Oral thrush. No dental caries.  Airway - Mallampati class IV.   Neck: No masses. Midline trachea. No JVD, (-) LAD. (-) bruits appreciated.  Respiratory/Chest: Grossly normal chest. (-) deformity. (-) Accessory muscle use.  Symmetric expansion. (-) Tenderness on palpation.  Resonant on percussion.  Diminished BS on both lower lung zones. (-) wheezing, crackles, rhonchi (-) egophony  Cardiovascular: Regular rate and  rhythm, heart sounds normal, no murmur or gallops, no peripheral edema  Gastrointestinal:  Normal bowel sounds. Soft, non-tender. No hepatosplenomegaly.  (-) masses.   Musculoskeletal:  Normal muscle tone. Normal gait.   Extremities: Grossly normal. (-) clubbing, cyanosis.  (-) edema  Skin: (-) rash,lesions seen.   Neurological/Psychiatric : alert,  oriented to time, place, person. Normal mood and affect          Assessment & Plan:  Hypersomnia She has snoring, occasional witnessed apneas, occasional gasping  Or choking. Sleeps 6 hrs / night. She teaches at [employer], employed by [employer] school.  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  (-) abnormal behavior in sleep.   ESS 13.  She is scheduled for bariatric surgery.  Her brother has sleep apnea and uses CPAP.  Plan :  We discussed about the diagnosis of Obstructive Sleep Apnea (OSA) and implications of untreated OSA. We discussed about CPAP and BiPaP as possible treatment options.    Issue schedule for a late night sleep study on February 8. Anticipate, likely moderate. Anticipate, no issues with CPAP. Her brother has sleep apnea uses CPAP. She needs good compliance prior to bariatric surgery.   Patient was instructed to call the office if he/she has not heard back from the office 1-2 weeks after the sleep study.   Patient was instructed to call the office if he/she is having issues with the PAP device.   We discussed good sleep hygiene.   Patient was advised not to engage in activities requiring concentration and/or vigilance if he/she is sleepy.  Patient was advised not to drive if he/she is sleepy.    Asthma Childhood asthma. Triggers: cigarette smoke, dust, change in weather, pollen. She is not on maintenance meds.  The cold weather has made her use alb 1x/week. Last ED visit for asthma was yrs ago.  Not been intubated for asthma.    We'll prescribe her albuterol.  Needs chest x-ray on follow-up.  Morbidly obese Weight reduction    Thank you very much for letting me participate in this patient's care. Please do not hesitate to give me a call if you have any questions or concerns regarding the treatment plan.   Patient will follow up with me in April.     J.  Shirl Harris, MD 06/11/2016   2:56 PM Pulmonary and [place] Pager: [phone] Office: [phone], Fax: [phone]

## 2016-06-11 NOTE — Assessment & Plan Note (Signed)
Childhood asthma. Triggers: cigarette smoke, dust, change in weather, pollen. She is not on maintenance meds.  The cold weather has made her use alb 1x/week. Last ED visit for asthma was yrs ago.  Not been intubated for asthma.    We'll prescribe her albuterol.  Needs chest x-ray on follow-up.

## 2016-06-11 NOTE — Patient Instructions (Signed)
It was a pleasure taking care of you today!  We will schedule you to have a sleep study to determine if you have sleep apnea.    You have  been scheduled for a sleep study on 06/29/2016. Please give [place] a call in a week is no one from the sleep lab calls you in 2-3 days.   If the sleep study is positive, we will order you a CPAP  machine.  Please call the office if you do NOT receive your machine in the next 1-2 weeks.   Please make sure you use your CPAP device everytime you sleep.  We will monitor the usage of your machine per your insurance requirement.  Your insurance company may take the machine from you if you are not using it regularly.   Please clean the mask, tubings, filter, water reservoir with soapy water every week.  Please use distilled water for the water reservoir.   Please call the office or your machine provider (DME company) if you are having issues with the device.   Return to clinic first week of April with Dr. Corrie Dandy

## 2016-06-11 NOTE — Assessment & Plan Note (Signed)
She has snoring, occasional witnessed apneas, occasional gasping  Or choking. Sleeps 6 hrs / night. She teaches at [employer], employed by [employer] school.  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  (-) abnormal behavior in sleep.   ESS 13.  She is scheduled for bariatric surgery.  Her brother has sleep apnea and uses CPAP.  Plan :  We discussed about the diagnosis of Obstructive Sleep Apnea (OSA) and implications of untreated OSA. We discussed about CPAP and BiPaP as possible treatment options.    Issue schedule for a late night sleep study on February 8. Anticipate, likely moderate. Anticipate, no issues with CPAP. Her brother has sleep apnea uses CPAP. She needs good compliance prior to bariatric surgery.   Patient was instructed to call the office if he/she has not heard back from the office 1-2 weeks after the sleep study.   Patient was instructed to call the office if he/she is having issues with the PAP device.   We discussed good sleep hygiene.   Patient was advised not to engage in activities requiring concentration and/or vigilance if he/she is sleepy.  Patient was advised not to drive if he/she is sleepy.

## 2016-06-29 ENCOUNTER — Ambulatory Visit (HOSPITAL_BASED_OUTPATIENT_CLINIC_OR_DEPARTMENT_OTHER): Payer: BC Managed Care – PPO | Attending: General Surgery | Admitting: Internal Medicine

## 2016-06-29 VITALS — Ht 66.0 in | Wt 250.0 lb

## 2016-06-29 DIAGNOSIS — G4733 Obstructive sleep apnea (adult) (pediatric): Secondary | ICD-10-CM

## 2016-06-29 DIAGNOSIS — R0683 Snoring: Secondary | ICD-10-CM

## 2016-06-29 DIAGNOSIS — Z9989 Dependence on other enabling machines and devices: Secondary | ICD-10-CM

## 2016-07-04 ENCOUNTER — Ambulatory Visit (INDEPENDENT_AMBULATORY_CARE_PROVIDER_SITE_OTHER): Payer: BC Managed Care – PPO | Admitting: Endocrinology

## 2016-07-04 ENCOUNTER — Encounter: Payer: Self-pay | Admitting: Endocrinology

## 2016-07-04 VITALS — BP 124/82 | HR 89 | Ht 65.0 in | Wt 254.0 lb

## 2016-07-04 DIAGNOSIS — E1151 Type 2 diabetes mellitus with diabetic peripheral angiopathy without gangrene: Secondary | ICD-10-CM | POA: Diagnosis not present

## 2016-07-04 DIAGNOSIS — Z794 Long term (current) use of insulin: Secondary | ICD-10-CM

## 2016-07-04 LAB — POCT GLYCOSYLATED HEMOGLOBIN (HGB A1C): Hemoglobin A1C: 9.2

## 2016-07-04 MED ORDER — BASAGLAR KWIKPEN 100 UNIT/ML ~~LOC~~ SOPN: 400.0000 [IU] | PEN_INJECTOR | SUBCUTANEOUS | 11 refills | Status: DC

## 2016-07-04 NOTE — Progress Notes (Signed)
Subjective:    Patient ID: Rebekah Kennedy, female    DOB: 02-15-1956, 61 y.o.   MRN: [id number]  HPI Pt returns for f/u of diabetes mellitus: DM type: Insulin-requiring type 2 Dx'ed: 9892 Complications: CVA, nephropathy, and retinopathy.  Therapy: insulin since 2013 GDM: never DKA: never Severe hypoglycemia: never. Pancreatitis: never Other: due to noncompliance with insulin injections and cbg monitoring, she is on a qd insulin regimen.   Interval history: Pt says she misses the insulin approx twice per month  She takes 350 units qam.  no cbg record, but states cbg's still vary from 120-400.  It is in general higher as the day goes on.  pt states she feels well in general.   Past Medical History:  Diagnosis Date  . ASTHMA NOS W/ACUTE EXACERBATION 07/10/2010  . Cataract   . Cervical dysplasia   . CHEST PAIN 01/23/2007  . DEPRESSION 03/16/2007  . DIABETES MELLITUS, TYPE II 12/01/2006  . Dysmenorrhea   . Fibroid   . HYPERLIPIDEMIA 12/01/2006  . HYPERTENSION 07/07/2006  . Obesity   . Splenomegaly    in college  . 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 TIA (transient ischemic attack) 2012    Past Surgical History:  Procedure Laterality Date  . 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 APPENDECTOMY    . BIOPSY THYROID  05/02/11   Nonneoplastic goiter  . BREAST BIOPSY    . BREAST SURGERY     Reduction  . COLPOSCOPY    . DILATION AND CURETTAGE OF UTERUS  1975   DUB  . EYE SURGERY    . GYNECOLOGIC CRYOSURGERY    . MYOMECTOMY    . OVARIAN CYST REMOVAL    . PELVIC LAPAROSCOPY  75,88   DL lysis of adhesions    Social History   Social History  . Marital status: Single    Spouse name: N/A  . Number of children: 2  . Years of education: N/A   Occupational History  . [employer]   Social History Main Topics  . Smoking status: Never Smoker  . Smokeless tobacco: Never Used  . Alcohol use 0.0 oz/week     Comment: very rare  .  Drug use: No  . Sexual activity: Not Currently    Birth control/ protection: Post-menopausal     Comment: 1st intercourse 61 yo-Fewer than 5 partners   Other Topics Concern  . 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 albuterol (PROVENTIL HFA;VENTOLIN HFA) 108 (90 BASE) MCG/ACT inhaler Inhale 2 puffs into the lungs every 4 (four) hours as needed for wheezing or shortness of breath. 18 g 2  . albuterol (PROVENTIL HFA;VENTOLIN HFA) 108 (90 Base) MCG/ACT inhaler Inhale 2 puffs into the lungs every 4 (four) hours as needed for wheezing or shortness of breath. 1 Inhaler 2  . amLODipine (NORVASC) 10 MG tablet TAKE 10 MG BY MOUTH ONE TIME DAILY Keep appt w/new PCP for future refills 90 tablet 0  . blood glucose meter kit and supplies KIT One touch verio. Use up to four times daily as directed. (FOR ICD-9 250.00, 250.01). 1 each 0  . budesonide-formoterol (SYMBICORT) 160-4.5 MCG/ACT inhaler Inhale 2 puffs into the  lungs 2 (two) times daily.    . clopidogrel (PLAVIX) 75 MG tablet Take 1 tablet (75 mg total) by mouth daily. 90 tablet 3  . Cod Liver Oil 1000 MG CAPS Take 1 capsule by mouth daily.    . cyclobenzaprine (FLEXERIL) 5 MG tablet Take 1 tablet (5 mg total) by mouth at bedtime as needed for muscle spasms. 30 tablet 5  . Insulin Pen Needle 29G X 12MM MISC 1 Device by Does not apply route 2 (two) times daily. 100 each 11  . losartan (COZAAR) 100 MG tablet TAKE100 MG BY MOUTH ONE TIME DAILY Keep new appt w/new PCP for refills 90 tablet 0  . metoprolol succinate (TOPROL-XL) 100 MG 24 hr tablet TAKE 1 TABLET BY MOUTH DAILY WITH OR IMMEDIATELY FOLLOWING A MEAL 90 tablet 2  . montelukast (SINGULAIR) 10 MG tablet TAKE 1 TABLET BY MOUTH AT BEDTIME 30 tablet 5  . Multiple Vitamin (MULTIVITAMIN) tablet Take 1 tablet  by mouth daily.      . pravastatin (PRAVACHOL) 20 MG tablet Take 1 tablet (20 mg total) by mouth daily. 90 tablet 3  . spironolactone (ALDACTONE) 25 MG tablet Take 0.5 tablets (12.5 mg total) by mouth daily. 90 tablet 1  . triamcinolone cream (KENALOG) 0.1 % Apply 1 application topically 4 (four) times daily. As needed for itching 45 g 11   No current facility-administered medications on file prior to visit.     Allergies  Allergen Reactions  . Quinapril     cough    Family History  Problem Relation Age of Onset  . Cancer Maternal Grandmother     Colon Cancer  . Asthma Maternal Grandmother   . Diabetes Father   . Heart disease Father   . Hypertension Father   . Hyperlipidemia Father   . Hypertension Mother   . Heart disease Mother   . Ovarian cancer Mother   . Cancer Mother     Lung cancer  . Asthma Mother   . COPD Mother   . Hyperlipidemia Mother   . Diabetes Brother   . Hypertension Brother   . Kidney disease Brother   . Asthma Brother   . Heart disease Brother   . Hyperlipidemia Brother   . Graves' disease Sister   . Diabetes Sister   . Breast cancer Sister 28  . Graves' disease Paternal Grandmother   . Hypertension Paternal Grandmother   . Heart disease Paternal Grandmother   . Alzheimer's disease Paternal Grandmother   . Cancer Maternal Grandfather     BP 124/82   Pulse 89   Ht 5' 5" (1.651 m)   Wt 254 lb (115.2 kg)   SpO2 95%   BMI 42.27 kg/m    Review of Systems She denies hypoglycemia.      Objective:   Physical Exam VITAL SIGNS:  See vs page GENERAL: no distress Pulses: dorsalis pedis intact bilat.   MSK: no deformity of the feet CV: 1+ bilat leg edema. Skin:  no ulcer on the feet.  normal color and temp on the feet.  Neuro: sensation is intact to touch on the feet.  Ext: There is bilateral onychomycosis of the toenails.   Lab Results  Component Value Date   HGBA1C 9.2 07/04/2016      Assessment & Plan:  Insulin-requiring type 2 DM,  with CVA: she needs increased rx.    Patient is advised the following: Patient Instructions  On this type of insulin schedule, you should eat meals on  a regular schedule.  If a meal is missed or significantly delayed, your blood sugar could go low.  Please increase the insulin to 400 units each morning, and:  Continue working toward the weight loss surgery.  check your blood sugar 2 times a day.  vary the time of day when you check, between before the 3 meals, and at bedtime.  also check if you have symptoms of your blood sugar being too high or too low.  please keep a record of the readings and bring it to your next appointment here.  please call us sooner if you are having low blood sugar episodes, or if it stays over 200.   Please come back for a follow-up appointment in 3 months.

## 2016-07-04 NOTE — Patient Instructions (Addendum)
On this type of insulin schedule, you should eat meals on a regular schedule.  If a meal is missed or significantly delayed, your blood sugar could go low.  Please increase the insulin to 400 units each morning, and:  Continue working toward the weight loss surgery.  check your blood sugar 2 times a day.  vary the time of day when you check, between before the 3 meals, and at bedtime.  also check if you have symptoms of your blood sugar being too high or too low.  please keep a record of the readings and bring it to your next appointment here.  please call us sooner if you are having low blood sugar episodes, or if it stays over 200.   Please come back for a follow-up appointment in 3 months.

## 2016-07-10 DIAGNOSIS — G4733 Obstructive sleep apnea (adult) (pediatric): Secondary | ICD-10-CM | POA: Diagnosis not present

## 2016-07-10 DIAGNOSIS — Z9989 Dependence on other enabling machines and devices: Secondary | ICD-10-CM

## 2016-07-10 NOTE — Procedures (Signed)
Patient Name: Rebekah Kennedy, Rebekah Kennedy Date: 06/29/2016 Gender: Female D.O.B: 01/27/1956 Age (years): 60 Referring Provider: Lurena Joiner [name] Height (inches): 46 Interpreting Physician: Baird Lyons MD, ABSM Weight (lbs): 250 RPSGT: Jonna Coup BMI: 40 MRN: [id number] Neck Size: 16.00 CLINICAL INFORMATION Sleep Study Type: Split Night CPAP     Indication for sleep study: Diabetes, Excessive Daytime Sleepiness, Fatigue, Hypertension, Obesity, Snoring     Epworth Sleepiness Score: 11/24  SLEEP STUDY TECHNIQUE As per the AASM Manual for the Scoring of Sleep and Associated Events v2.3 (April 2016) with a hypopnea requiring 4% desaturations.  The channels recorded and monitored were frontal, central and occipital EEG, electrooculogram (EOG), submentalis EMG (chin), nasal and oral airflow, thoracic and abdominal wall motion, anterior tibialis EMG, snore microphone, electrocardiogram, and pulse oximetry. Continuous positive airway pressure (CPAP) was initiated when the patient met split night criteria and was titrated according to treat sleep-disordered breathing.  MEDICATIONS Medications self-administered by patient taken the night of the study : none reported  RESPIRATORY PARAMETERS Diagnostic  Total AHI (/hr): 16.7 RDI (/hr): 18.1 OA Index (/hr): - CA Index (/hr): 0.5 REM AHI (/hr): 60.0 NREM AHI (/hr): 10.2 Supine AHI (/hr): 18.4 Non-supine AHI (/hr): 11.53 Min O2 Sat (%): 70.00 Mean O2 (%): 92.87 Time below 88% (min): 8.4   Titration  Optimal Pressure (cm): 14 AHI at Optimal Pressure (/hr): 0.0 Min O2 at Optimal Pressure (%): 91.0 Supine % at Optimal (%): 0 Sleep % at Optimal (%): 83   SLEEP ARCHITECTURE The recording time for the entire night was 359.4 minutes.  During a baseline period of 133.1 minutes, the patient slept for 122.4 minutes in REM and nonREM, yielding a sleep efficiency of 92.0%. Sleep onset after lights out was 6.7 minutes with a REM latency of 65.0  minutes. The patient spent 3.68% of the night in stage N1 sleep, 83.25% in stage N2 sleep, 0.00% in stage N3 and 13.08% in REM.  During the titration period of 215.0 minutes, the patient slept for 198.5 minutes in REM and nonREM, yielding a sleep efficiency of 92.3%. Sleep onset after CPAP initiation was 4.8 minutes with a REM latency of 49.5 minutes. The patient spent 2.52% of the night in stage N1 sleep, 76.57% in stage N2 sleep, 0.00% in stage N3 and 20.91% in REM.  CARDIAC DATA The 2 lead EKG demonstrated sinus rhythm. The mean heart rate was 75.82 beats per minute. Other EKG findings include: PVCs.  LEG MOVEMENT DATA The total Periodic Limb Movements of Sleep (PLMS) were 26. The PLMS index was 4.86 .  IMPRESSIONS - Moderate obstructive sleep apnea occurred during the diagnostic portion of the study(AHI = 16.7/hour). An optimal PAP pressure was selected for this patient ( 14 cm of water) - No significant central sleep apnea occurred during the diagnostic portion of the study (CAI = 0.5/hour). - Severe oxygen desaturation was noted during the diagnostic portion of the study (Min O2 = 70.00%). - The patient snored with Moderate snoring volume during the diagnostic portion of the study. - EKG findings include PVCs. - Mild periodic limb movements of sleep occurred during the study.  DIAGNOSIS - Obstructive Sleep Apnea (327.23 [G47.33 ICD-10])  RECOMMENDATIONS - Trial of CPAP therapy on 14 cm H2O with a Medium size Fisher&Paykel Full Face Mask Simplus mask and heated humidification. - Avoid alcohol, sedatives and other CNS depressants that may worsen sleep apnea and disrupt normal sleep architecture. - Sleep hygiene should be reviewed to assess factors that may improve sleep quality. -  Weight management and regular exercise should be initiated or continued.  [Electronically signed] 07/10/2016 02:50 PM  Baird Lyons MD, [place], American Board of Sleep Medicine   NPI:  [id number]  [place], [facility] [employer] [facility]  ELECTRONICALLY SIGNED ON:  07/10/2016, 2:50 PM [place] PH: [phone]   FX: [phone] [place]

## 2016-07-17 ENCOUNTER — Encounter: Payer: Self-pay | Admitting: Internal Medicine

## 2016-07-17 ENCOUNTER — Ambulatory Visit (INDEPENDENT_AMBULATORY_CARE_PROVIDER_SITE_OTHER): Payer: BC Managed Care – PPO | Admitting: Internal Medicine

## 2016-07-17 VITALS — BP 134/86 | HR 99 | Temp 98.4°F | Resp 18 | Ht 65.0 in | Wt 259.0 lb

## 2016-07-17 DIAGNOSIS — Z0000 Encounter for general adult medical examination without abnormal findings: Secondary | ICD-10-CM | POA: Diagnosis not present

## 2016-07-17 DIAGNOSIS — E7800 Pure hypercholesterolemia, unspecified: Secondary | ICD-10-CM | POA: Diagnosis not present

## 2016-07-17 DIAGNOSIS — Z23 Encounter for immunization: Secondary | ICD-10-CM | POA: Diagnosis not present

## 2016-07-17 DIAGNOSIS — J45909 Unspecified asthma, uncomplicated: Secondary | ICD-10-CM | POA: Diagnosis not present

## 2016-07-17 DIAGNOSIS — E1151 Type 2 diabetes mellitus with diabetic peripheral angiopathy without gangrene: Secondary | ICD-10-CM

## 2016-07-17 DIAGNOSIS — Z794 Long term (current) use of insulin: Secondary | ICD-10-CM

## 2016-07-17 DIAGNOSIS — G463 Brain stem stroke syndrome: Secondary | ICD-10-CM

## 2016-07-17 DIAGNOSIS — R252 Cramp and spasm: Secondary | ICD-10-CM | POA: Diagnosis not present

## 2016-07-17 DIAGNOSIS — I10 Essential (primary) hypertension: Secondary | ICD-10-CM | POA: Diagnosis not present

## 2016-07-17 NOTE — Assessment & Plan Note (Signed)
Borderline high today Work on lifestyle changes No change in meds Check basic labs

## 2016-07-17 NOTE — Progress Notes (Signed)
Subjective:    Patient ID: Rebekah Kennedy, female    DOB: Dec 12, 1955, 61 y.o.   MRN: [id number]  HPI She is here for a physical exam.   She had diarrhea all last night.  She had BBQ and last night.  She did not sleep well.  She did not go to work this morning.  It has resolved.  She thinks it was just related to what she ate.    She had an episode of what she thought was low sugar over the weekend.  She was shaking.  She was not able to get her vision clear.  She ate something and drank something and felt better after 10 minutes.  She felt normal after about 30 minutes. She did not check her sugar.  She was alone and is not sure if she had speech problems.  She denied numbness/tingling or weakness in her arms/legs that were new with this episode.  In the past she had similar symptoms and it was a stroke. She wants to make sure it was not a stroke.  She has been taking plavix daily.    Every night she has bad leg cramps.  She walks some.  She was taking flexeril in the past, but it makes her too groggy in the morning.    She is going to have gastric sleeve procedure in the next few months.    Medications and allergies reviewed with patient and updated if appropriate.  Patient Active Problem List   Diagnosis Date Noted  . Hypersomnia 06/11/2016  . Diabetes ([place]) 07/19/2015  . Irritant dermatitis 01/29/2015  . History of colonic polyps 12/01/2014  . Rotator cuff injury 01/27/2014  . Vaginal atrophy 10/22/2013  . Morbidly obese ([place]) 10/05/2013  . Partial nontraumatic tear of right rotator cuff 09/21/2013  . Cervical dysplasia   . Goiter 12/10/2011  . Asthma 12/10/2011  . 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 TIA (transient ischemic attack) 02/13/2011  . Fibroid   . PROTEINURIA, MILD 09/15/2009  . MUSCLE PAIN 07/22/2008  . PAIN IN JOINT, UNSPECIFIED SITE 01/23/2007  . Hyperlipidemia 12/01/2006  . Essential hypertension 07/07/2006    Current Outpatient Prescriptions on  File Prior to Visit  Medication Sig Dispense Refill  . albuterol (PROVENTIL HFA;VENTOLIN HFA) 108 (90 BASE) MCG/ACT inhaler Inhale 2 puffs into the lungs every 4 (four) hours as needed for wheezing or shortness of breath. 18 g 2  . albuterol (PROVENTIL HFA;VENTOLIN HFA) 108 (90 Base) MCG/ACT inhaler Inhale 2 puffs into the lungs every 4 (four) hours as needed for wheezing or shortness of breath. 1 Inhaler 2  . amLODipine (NORVASC) 10 MG tablet TAKE 10 MG BY MOUTH ONE TIME DAILY Keep appt w/new PCP for future refills 90 tablet 0  . blood glucose meter kit and supplies KIT One touch verio. Use up to four times daily as directed. (FOR ICD-9 250.00, 250.01). 1 each 0  . budesonide-formoterol (SYMBICORT) 160-4.5 MCG/ACT inhaler Inhale 2 puffs into the lungs 2 (two) times daily.    . clopidogrel (PLAVIX) 75 MG tablet Take 1 tablet (75 mg total) by mouth daily. 90 tablet 3  . Cod Liver Oil 1000 MG CAPS Take 1 capsule by mouth daily.    . Insulin Glargine (BASAGLAR KWIKPEN) 100 UNIT/ML SOPN Inject 4 mLs (400 Units total) into the skin every morning. 105 mL 11  . Insulin Pen Needle 29G X 12MM MISC 1 Device by Does not apply route 2 (two) times daily. 100  each 11  . losartan (COZAAR) 100 MG tablet TAKE100 MG BY MOUTH ONE TIME DAILY Keep new appt w/new PCP for refills 90 tablet 0  . metoprolol succinate (TOPROL-XL) 100 MG 24 hr tablet TAKE 1 TABLET BY MOUTH DAILY WITH OR IMMEDIATELY FOLLOWING A MEAL 90 tablet 2  . montelukast (SINGULAIR) 10 MG tablet TAKE 1 TABLET BY MOUTH AT BEDTIME 30 tablet 5  . Multiple Vitamin (MULTIVITAMIN) tablet Take 1 tablet by mouth daily.      . pravastatin (PRAVACHOL) 20 MG tablet Take 1 tablet (20 mg total) by mouth daily. 90 tablet 3  . spironolactone (ALDACTONE) 25 MG tablet Take 0.5 tablets (12.5 mg total) by mouth daily. 90 tablet 1  . triamcinolone cream (KENALOG) 0.1 % Apply 1 application topically 4 (four) times daily. As needed for itching 45 g 11   No current  facility-administered medications on file prior to visit.     Past Medical History:  Diagnosis Date  . ASTHMA NOS W/ACUTE EXACERBATION 07/10/2010  . Cataract   . Cervical dysplasia   . CHEST PAIN 01/23/2007  . DEPRESSION 03/16/2007  . DIABETES MELLITUS, TYPE II 12/01/2006  . Dysmenorrhea   . Fibroid   . HYPERLIPIDEMIA 12/01/2006  . HYPERTENSION 07/07/2006  . Obesity   . Splenomegaly    in college  . Stroke [facility]) 2012   R pontine, mild residual left hemiparesis  . TIA (transient ischemic attack) 2012    Past Surgical History:  Procedure Laterality Date  . 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 APPENDECTOMY    . BIOPSY THYROID  05/02/11   Nonneoplastic goiter  . BREAST BIOPSY    . BREAST SURGERY     Reduction  . COLPOSCOPY    . DILATION AND CURETTAGE OF UTERUS  1975   DUB  . EYE SURGERY    . GYNECOLOGIC CRYOSURGERY    . MYOMECTOMY    . OVARIAN CYST REMOVAL    . PELVIC LAPAROSCOPY  75,88   DL lysis of adhesions    Social History   Social History  . Marital status: Single    Spouse name: N/A  . Number of children: 2  . Years of education: N/A   Occupational History  . [place]   Social History Main Topics  . Smoking status: Never Smoker  . Smokeless tobacco: Never Used  . Alcohol use 0.0 oz/week     Comment: very rare  . Drug use: No  . Sexual activity: Not Currently    Birth control/ protection: Post-menopausal     Comment: 1st intercourse 61 yo-Fewer than 5 partners   Other Topics Concern  . 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 Cancer Maternal Grandmother     Colon Cancer  . Asthma Maternal Grandmother   . Diabetes Father   . Heart disease Father   . Hypertension Father   . Hyperlipidemia Father   . Hypertension Mother   . Heart disease Mother   . Ovarian cancer Mother   .  Cancer Mother     Lung cancer  . Asthma Mother   . COPD Mother   . Hyperlipidemia Mother   . Diabetes Brother   . Hypertension Brother   . Kidney disease Brother   . Asthma  Brother   . Heart disease Brother   . Hyperlipidemia Brother   . Graves' disease Sister   . Diabetes Sister   . Breast cancer Sister 29  . Graves' disease Paternal Grandmother   . Hypertension Paternal Grandmother   . Heart disease Paternal Grandmother   . Alzheimer's disease Paternal Grandmother   . Cancer Maternal Grandfather     Review of Systems  Constitutional: Negative for chills and fever.  Eyes: Positive for visual disturbance (occ - retinopathy).  Respiratory: Positive for cough (occ), chest tightness (intermittent, with weather changes, colds), shortness of breath (intermittent, with weather changes, colds) and wheezing (intermittent, with weather changes, colds).   Cardiovascular: Positive for leg swelling (chronic). Negative for chest pain and palpitations.  Gastrointestinal: Positive for abdominal pain (mild cramping last night - resolved) and diarrhea (last night only). Negative for blood in stool, nausea and vomiting.  Genitourinary: Negative for dysuria and hematuria.  Musculoskeletal: Negative for arthralgias and back pain.  Skin: Positive for rash (dry skin related to diabetes).  Neurological: Negative for light-headedness and headaches.  Psychiatric/Behavioral: Negative for dysphoric mood. The patient is not nervous/anxious.        Objective:   Vitals:   07/17/16 1418  BP: 134/86  Pulse: 99  Resp: 18  Temp: 98.4 F (36.9 C)   Filed Weights   07/17/16 1418  Weight: 259 lb (117.5 kg)   Body mass index is 43.1 kg/m.  Wt Readings from Last 3 Encounters:  07/17/16 259 lb (117.5 kg)  07/04/16 254 lb (115.2 kg)  06/29/16 250 lb (113.4 kg)     Physical Exam Constitutional: She appears well-developed and well-nourished. No distress.  HENT:  Head: Normocephalic and atraumatic.    Right Ear: External ear normal. Normal ear canal and TM Left Ear: External ear normal.  Normal ear canal and TM Mouth/Throat: Oropharynx is clear and moist.  Eyes: Conjunctivae and EOM are normal.  Neck: Neck supple. No tracheal deviation present. No thyromegaly present.  No carotid bruit  Cardiovascular: Normal rate, regular rhythm and normal heart sounds.   No murmur heard.  No edema. Pulmonary/Chest: Effort normal and breath sounds normal. No respiratory distress. She has no wheezes. She has no rales.  Breast: deferred to Gyn Abdominal: Soft. She exhibits no distension. There is no tenderness.  Lymphadenopathy: She has no cervical adenopathy.  Skin: Skin is warm and dry. She is not diaphoretic.  Psychiatric: She has a normal mood and affect. Her behavior is normal.       Assessment & Plan:   Physical exam: Screening blood work ordered Immunizations pneumovax today, others up to date Colonoscopy  Up to date  Mammogram  Up to date  Gyn  Up to date  Eye exams - Up to date - scheduled Exercise  - stressed regular exercise Weight   - encouraged weight loss Skin  - no concerns Substance abuse  -  none  See Problem List for Assessment and Plan of chronic medical problems.   FU in 6 months

## 2016-07-17 NOTE — Assessment & Plan Note (Signed)
Management per Dr Loanne Drilling Stressed increased exercise, diabetic diet and weight loss

## 2016-07-17 NOTE — Assessment & Plan Note (Addendum)
Check labs, magnesium Can try non sedating muscle relaxer - flexeril made her too drowsy -- she will let me know  Increase exercise Drink plenty of water

## 2016-07-17 NOTE — Patient Instructions (Addendum)
Test(s) ordered today. Your results will be released to [facility] (or called to you) after review, usually within 72hours after test completion. If any changes need to be made, you will be notified at that same time.  All other Health Maintenance issues reviewed.   All recommended immunizations and age-appropriate screenings are up-to-date or discussed.  Pneumovax given today.   Medications reviewed and updated.  No changes recommended at this time.      Please followup in 6 months, sooner if needed   Health Maintenance, Female Adopting a healthy lifestyle and getting preventive care can go a long way to promote health and wellness. Talk with your health care provider about what schedule of regular examinations is right for you. This is a good chance for you to check in with your provider about disease prevention and staying healthy. In between checkups, there are plenty of things you can do on your own. Experts have done a lot of research about which lifestyle changes and preventive measures are most likely to keep you healthy. Ask your health care provider for more information. Weight and diet Eat a healthy diet  Be sure to include plenty of vegetables, fruits, low-fat dairy products, and lean protein.  Do not eat a lot of foods high in solid fats, added sugars, or salt.  Get regular exercise. This is one of the most important things you can do for your health.  Most adults should exercise for at least 150 minutes each week. The exercise should increase your heart rate and make you sweat (moderate-intensity exercise).  Most adults should also do strengthening exercises at least twice a week. This is in addition to the moderate-intensity exercise. Maintain a healthy weight  Body mass index (BMI) is a measurement that can be used to identify possible weight problems. It estimates body fat based on height and weight. Your health care provider can help determine your BMI and help you  achieve or maintain a healthy weight.  For females 18 years of age and older:  A BMI below 18.5 is considered underweight.  A BMI of 18.5 to 24.9 is normal.  A BMI of 25 to 29.9 is considered overweight.  A BMI of 30 and above is considered obese. Watch levels of cholesterol and blood lipids  You should start having your blood tested for lipids and cholesterol at 60 years of age, then have this test every 5 years.  You may need to have your cholesterol levels checked more often if:  Your lipid or cholesterol levels are high.  You are older than 60 years of age.  You are at high risk for heart disease. Cancer screening Lung Cancer  Lung cancer screening is recommended for adults 71-44 years old who are at high risk for lung cancer because of a history of smoking.  A yearly low-dose CT scan of the lungs is recommended for people who:  Currently smoke.  Have quit within the past 15 years.  Have at least a 30-pack-year history of smoking. A pack year is smoking an average of one pack of cigarettes a day for 1 year.  Yearly screening should continue until it has been 15 years since you quit.  Yearly screening should stop if you develop a health problem that would prevent you from having lung cancer treatment. Breast Cancer  Practice breast self-awareness. This means understanding how your breasts normally appear and feel.  It also means doing regular breast self-exams. Let your health care provider know about  any changes, no matter how small.  If you are in your 20s or 30s, you should have a clinical breast exam (CBE) by a health care provider every 1-3 years as part of a regular health exam.  If you are 36 or older, have a CBE every year. Also consider having a breast X-ray (mammogram) every year.  If you have a family history of breast cancer, talk to your health care provider about genetic screening.  If you are at high risk for breast cancer, talk to your health care  provider about having an MRI and a mammogram every year.  Breast cancer gene (BRCA) assessment is recommended for women who have family members with BRCA-related cancers. BRCA-related cancers include:  Breast.  Ovarian.  Tubal.  Peritoneal cancers.  Results of the assessment will determine the need for genetic counseling and BRCA1 and BRCA2 testing. Cervical Cancer  Your health care provider may recommend that you be screened regularly for cancer of the pelvic organs (ovaries, uterus, and vagina). This screening involves a pelvic examination, including checking for microscopic changes to the surface of your cervix (Pap test). You may be encouraged to have this screening done every 3 years, beginning at age 28.  For women ages 47-65, health care providers may recommend pelvic exams and Pap testing every 3 years, or they may recommend the Pap and pelvic exam, combined with testing for human papilloma virus (HPV), every 5 years. Some types of HPV increase your risk of cervical cancer. Testing for HPV may also be done on women of any age with unclear Pap test results.  Other health care providers may not recommend any screening for nonpregnant women who are considered low risk for pelvic cancer and who do not have symptoms. Ask your health care provider if a screening pelvic exam is right for you.  If you have had past treatment for cervical cancer or a condition that could lead to cancer, you need Pap tests and screening for cancer for at least 20 years after your treatment. If Pap tests have been discontinued, your risk factors (such as having a new sexual partner) need to be reassessed to determine if screening should resume. Some women have medical problems that increase the chance of getting cervical cancer. In these cases, your health care provider may recommend more frequent screening and Pap tests. Colorectal Cancer  This type of cancer can be detected and often prevented.  Routine  colorectal cancer screening usually begins at 60 years of age and continues through 60 years of age.  Your health care provider may recommend screening at an earlier age if you have risk factors for colon cancer.  Your health care provider may also recommend using home test kits to check for hidden blood in the stool.  A small camera at the end of a tube can be used to examine your colon directly (sigmoidoscopy or colonoscopy). This is done to check for the earliest forms of colorectal cancer.  Routine screening usually begins at age 66.  Direct examination of the colon should be repeated every 5-10 years through 60 years of age. However, you may need to be screened more often if early forms of precancerous polyps or small growths are found. Skin Cancer  Check your skin from head to toe regularly.  Tell your health care provider about any new moles or changes in moles, especially if there is a change in a mole's shape or color.  Also tell your health care provider if you  have a mole that is larger than the size of a pencil eraser.  Always use sunscreen. Apply sunscreen liberally and repeatedly throughout the day.  Protect yourself by wearing long sleeves, pants, a wide-brimmed hat, and sunglasses whenever you are outside. Heart disease, diabetes, and high blood pressure  High blood pressure causes heart disease and increases the risk of stroke. High blood pressure is more likely to develop in:  People who have blood pressure in the high end of the normal range (130-139/85-89 mm Hg).  People who are overweight or obese.  People who are African American.  If you are 31-72 years of age, have your blood pressure checked every 3-5 years. If you are 82 years of age or older, have your blood pressure checked every year. You should have your blood pressure measured twice-once when you are at a hospital or clinic, and once when you are not at a hospital or clinic. Record the average of the two  measurements. To check your blood pressure when you are not at a hospital or clinic, you can use:  An automated blood pressure machine at a pharmacy.  A home blood pressure monitor.  If you are between 105 years and 3 years old, ask your health care provider if you should take aspirin to prevent strokes.  Have regular diabetes screenings. This involves taking a blood sample to check your fasting blood sugar level.  If you are at a normal weight and have a low risk for diabetes, have this test once every three years after 60 years of age.  If you are overweight and have a high risk for diabetes, consider being tested at a younger age or more often. Preventing infection Hepatitis B  If you have a higher risk for hepatitis B, you should be screened for this virus. You are considered at high risk for hepatitis B if:  You were born in a country where hepatitis B is common. Ask your health care provider which countries are considered high risk.  Your parents were born in a high-risk country, and you have not been immunized against hepatitis B (hepatitis B vaccine).  You have HIV or AIDS.  You use needles to inject street drugs.  You live with someone who has hepatitis B.  You have had sex with someone who has hepatitis B.  You get hemodialysis treatment.  You take certain medicines for conditions, including cancer, organ transplantation, and autoimmune conditions. Hepatitis C  Blood testing is recommended for:  Everyone born from [birth year] through 1964.  Anyone with known risk factors for hepatitis C. Sexually transmitted infections (STIs)  You should be screened for sexually transmitted infections (STIs) including gonorrhea and chlamydia if:  You are sexually active and are younger than 60 years of age.  You are older than 60 years of age and your health care provider tells you that you are at risk for this type of infection.  Your sexual activity has changed since you were last  screened and you are at an increased risk for chlamydia or gonorrhea. Ask your health care provider if you are at risk.  If you do not have HIV, but are at risk, it may be recommended that you take a prescription medicine daily to prevent HIV infection. This is called pre-exposure prophylaxis (PrEP). You are considered at risk if:  You are sexually active and do not regularly use condoms or know the HIV status of your partner(s).  You take drugs by injection.  You are  sexually active with a partner who has HIV. Talk with your health care provider about whether you are at high risk of being infected with HIV. If you choose to begin PrEP, you should first be tested for HIV. You should then be tested every 3 months for as long as you are taking PrEP. Pregnancy  If you are premenopausal and you may become pregnant, ask your health care provider about preconception counseling.  If you may become pregnant, take 400 to 800 micrograms (mcg) of folic acid every day.  If you want to prevent pregnancy, talk to your health care provider about birth control (contraception). Osteoporosis and menopause  Osteoporosis is a disease in which the bones lose minerals and strength with aging. This can result in serious bone fractures. Your risk for osteoporosis can be identified using a bone density scan.  If you are 50 years of age or older, or if you are at risk for osteoporosis and fractures, ask your health care provider if you should be screened.  Ask your health care provider whether you should take a calcium or vitamin D supplement to lower your risk for osteoporosis.  Menopause may have certain physical symptoms and risks.  Hormone replacement therapy may reduce some of these symptoms and risks. Talk to your health care provider about whether hormone replacement therapy is right for you. Follow these instructions at home:  Schedule regular health, dental, and eye exams.  Stay current with your  immunizations.  Do not use any tobacco products including cigarettes, chewing tobacco, or electronic cigarettes.  If you are pregnant, do not drink alcohol.  If you are breastfeeding, limit how much and how often you drink alcohol.  Limit alcohol intake to no more than 1 drink per day for nonpregnant women. One drink equals 12 ounces of beer, 5 ounces of wine, or 1 ounces of hard liquor.  Do not use street drugs.  Do not share needles.  Ask your health care provider for help if you need support or information about quitting drugs.  Tell your health care provider if you often feel depressed.  Tell your health care provider if you have ever been abused or do not feel safe at home. This information is not intended to replace advice given to you by your health care provider. Make sure you discuss any questions you have with your health care provider. Document Released: 11/20/2010 Document Revised: 10/13/2015 Document Reviewed: 02/08/2015 Elsevier Interactive Patient Education  2017 [employer].

## 2016-07-17 NOTE — Assessment & Plan Note (Signed)
Check lipid panel  Continue daily statin Regular exercise and healthy diet encouraged  

## 2016-07-17 NOTE — Assessment & Plan Note (Signed)
Her recent symptoms sound more like low sugar - given how quickly they resolved with eating and no other concerning symptoms consistent with a stroke Continue statin, plavix  Check labs - LDL goal 70 Discussed she is still high risk for a stroke - if she has other symptoms similar - check sugar and will need further evaluation

## 2016-07-17 NOTE — Progress Notes (Signed)
Pre visit review using our clinic review tool, if applicable. No additional management support is needed unless otherwise documented below in the visit note. 

## 2016-07-18 ENCOUNTER — Other Ambulatory Visit (INDEPENDENT_AMBULATORY_CARE_PROVIDER_SITE_OTHER): Payer: BC Managed Care – PPO

## 2016-07-18 DIAGNOSIS — R252 Cramp and spasm: Secondary | ICD-10-CM

## 2016-07-18 DIAGNOSIS — E7800 Pure hypercholesterolemia, unspecified: Secondary | ICD-10-CM

## 2016-07-18 DIAGNOSIS — I10 Essential (primary) hypertension: Secondary | ICD-10-CM | POA: Diagnosis not present

## 2016-07-18 DIAGNOSIS — Z0000 Encounter for general adult medical examination without abnormal findings: Secondary | ICD-10-CM | POA: Diagnosis not present

## 2016-07-18 DIAGNOSIS — Z23 Encounter for immunization: Secondary | ICD-10-CM

## 2016-07-18 LAB — LIPID PANEL
CHOLESTEROL: 215 mg/dL — AB (ref 0–200)
HDL: 57.9 mg/dL (ref >39.00)
LDL Cholesterol: 127 mg/dL — ABNORMAL HIGH (ref 0–99)
NonHDL: 157.52
TRIGLYCERIDES: 151 mg/dL — AB (ref 0.0–149.0)
Total CHOL/HDL Ratio: 4
VLDL: 30.2 mg/dL (ref 0.0–40.0)

## 2016-07-18 LAB — CBC WITH DIFFERENTIAL/PLATELET
BASOS ABS: 0.1 10*3/uL (ref 0.0–0.1)
BASOS PCT: 1 % (ref 0.0–3.0)
EOS ABS: 0.6 10*3/uL (ref 0.0–0.7)
Eosinophils Relative: 5.5 % — ABNORMAL HIGH (ref 0.0–5.0)
HCT: 41.5 % (ref 36.0–46.0)
Hemoglobin: 13.3 g/dL (ref 12.0–15.0)
LYMPHS ABS: 3.1 10*3/uL (ref 0.7–4.0)
Lymphocytes Relative: 27.2 % (ref 12.0–46.0)
MCHC: 32.2 g/dL (ref 30.0–36.0)
MCV: 82.5 fl (ref 78.0–100.0)
Monocytes Absolute: 0.7 10*3/uL (ref 0.1–1.0)
Monocytes Relative: 6.5 % (ref 3.0–12.0)
NEUTROS PCT: 59.8 % (ref 43.0–77.0)
Neutro Abs: 6.9 10*3/uL (ref 1.4–7.7)
PLATELETS: 285 10*3/uL (ref 150.0–400.0)
RBC: 5.03 Mil/uL (ref 3.87–5.11)
RDW: 14.5 % (ref 11.5–15.5)
WBC: 11.5 10*3/uL — ABNORMAL HIGH (ref 4.0–10.5)

## 2016-07-18 LAB — COMPREHENSIVE METABOLIC PANEL
ALT: 19 U/L (ref 0–35)
AST: 11 U/L (ref 0–37)
Albumin: 3.9 g/dL (ref 3.5–5.2)
Alkaline Phosphatase: 72 U/L (ref 39–117)
BILIRUBIN TOTAL: 0.5 mg/dL (ref 0.2–1.2)
BUN: 13 mg/dL (ref 6–23)
CO2: 29 meq/L (ref 19–32)
CREATININE: 0.68 mg/dL (ref 0.40–1.20)
Calcium: 9.7 mg/dL (ref 8.4–10.5)
Chloride: 101 mEq/L (ref 96–112)
GFR: 113.53 mL/min (ref >60.00)
GLUCOSE: 186 mg/dL — AB (ref 70–99)
Potassium: 3.6 mEq/L (ref 3.5–5.1)
SODIUM: 139 meq/L (ref 135–145)
Total Protein: 7.4 g/dL (ref 6.0–8.3)

## 2016-07-18 LAB — MAGNESIUM: MAGNESIUM: 1.7 mg/dL (ref 1.5–2.5)

## 2016-07-18 LAB — TSH: TSH: 2.23 u[IU]/mL (ref 0.35–4.50)

## 2016-07-18 NOTE — Addendum Note (Signed)
Addended by: Terence Lux B on: 07/18/2016 09:04 AM   Modules accepted: Orders

## 2016-07-22 ENCOUNTER — Encounter: Payer: Self-pay | Admitting: Internal Medicine

## 2016-07-26 ENCOUNTER — Encounter: Payer: Self-pay | Admitting: Pulmonary Disease

## 2016-07-26 ENCOUNTER — Ambulatory Visit (INDEPENDENT_AMBULATORY_CARE_PROVIDER_SITE_OTHER): Payer: BC Managed Care – PPO | Admitting: Pulmonary Disease

## 2016-07-26 VITALS — BP 144/86 | HR 98 | Ht 65.0 in | Wt 255.6 lb

## 2016-07-26 DIAGNOSIS — G4733 Obstructive sleep apnea (adult) (pediatric): Secondary | ICD-10-CM

## 2016-07-26 DIAGNOSIS — J45909 Unspecified asthma, uncomplicated: Secondary | ICD-10-CM | POA: Diagnosis not present

## 2016-07-26 DIAGNOSIS — Z01818 Encounter for other preprocedural examination: Secondary | ICD-10-CM | POA: Diagnosis not present

## 2016-07-26 NOTE — Assessment & Plan Note (Signed)
Patient is to be scheduled for bariatric surgery care of Dr. Gurney Maxin.   I extensively discussed with the patient her increased risks with bariatric surgery given her moderate sleep apnea. She is at increased risks for pulmonary complications given her sleep apnea and her asthma.  Patient understands and accepts the risks.   We need to make sure that she is compliant and she uses her cpap machine.  Need to get a download for the next month. We need to make sure that her asthma is stable and clinically it is. She uses albuterol every 4 hours as needed.  For now, anticipate no absolute contraindication to contemplated surgery (bariatric surgery) pending good compliance and usage of cpap.  She will need a CXR within 1 month of surgery.

## 2016-07-26 NOTE — Assessment & Plan Note (Signed)
Weight reduction 

## 2016-07-26 NOTE — Assessment & Plan Note (Signed)
She has snoring, occasional witnessed apneas, occasional gasping  Or choking. Sleeps 6 hrs / night. She teaches at [employer], employed by [employer] school.  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  (-) abnormal behavior in sleep.   ESS 13.  Since last seen, she had a split night sleep study which showed an AHI of 17. Optimal on 14 cm water. There was an issue with ordering her CPAP machine.  She is being scheduled for bariatric surgery.  Her brother has sleep apnea and uses CPAP.  Plan :  We extensively discussed the diagnosis, pathophysiology, and treatment options for Obstructive Sleep Apnea (OSA).   We discussed treatment options for OSA including CPAP, BiPaP, as well as surgical options and oral devices.   We will start patient on autocpap machine set at 14 cm water. We will order the machine today. Discussed with her importance of compliance and usage specifically she'll have surgery and will be on pain medications. We need to get a download on the CPAP machine to make sure that she is corrected prior to surgery. Once a download is good and she is compliant, he should be okay to undergo surgery. 6 months after bariatric surgery, she will need a repeat download and potentially cut down her pressure as she will likely need less CPAP pressure.   Patient was instructed to call the office if he/she has not received the PAP device in 1-2 weeks.  Patient was instructed to have mask, tubings, filter, reservoir cleaned at least once a week with soapy water.  Patient was instructed to call the office if he/she is having issues with the PAP device.    I advised patient to obtain sufficient amount of sleep --  7 to 8 hours at least in a 24 hr period.  Patient was advised to follow good sleep hygiene.  Patient was advised NOT to engage in activities requiring concentration and/or vigilance if he/she is and  sleepy.  Patient is NOT to drive if he/she is sleepy.

## 2016-07-26 NOTE — Progress Notes (Signed)
Subjective:    Patient ID: Rebekah Kennedy, female    DOB: [birth year], 61 y.o.   MRN: [id number]  HPI   This is the case of Rebekah Kennedy, 61 y.o. Female, who was referred by Dr. Gurney Maxin in consultation regarding possible OSA and pre-operative clearance for contemplated bariatric surgery.   As you very well know, patient is a non smoker.  She is diagnosed with asthma, all her life. Triggers: cigarette smoke, dust, change in weather, pollen. She is not on maintenance meds.  The cold weather has made her use alb 1x/week. Last ED visit for asthma was yrs ago.  Not been intubated for asthma.   She has snoring, occasional witnessed apneas, occasional gasping  Or choking. Sleeps 6 hrs / night. She teaches at [employer], employed by [employer] school.  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  (-) abnormal behavior in sleep.   ESS 13.   (-) abnormal behavior in school.   ROV 07/26/16 Patient returns to the office as follow-up on her sleep apnea. Since last seen, she had a sleep study which showed AHI of 17 and optimal CPAP of 14 cm water. There is an issue with ordering her CPAP machine. Anyway, other than sleep apnea symptoms, she remains well. Her asthma has been stable. No schedule has been set with regards to her bariatric surgery. Has not been admitted nor been sick since last seen.  Review of Systems  Constitutional: Negative.  Negative for fever and unexpected weight change.  HENT: Negative.  Negative for congestion, dental problem, ear pain, nosebleeds, postnasal drip, rhinorrhea, sinus pressure, sneezing, sore throat and trouble swallowing.   Eyes: Negative.  Negative for redness and itching.  Respiratory: Positive for wheezing. Negative for cough, chest tightness and shortness of breath.   Cardiovascular: Positive for leg swelling. Negative for palpitations.  Gastrointestinal: Negative.  Negative for nausea and vomiting.  Genitourinary: Negative.  Negative  for dysuria.  Musculoskeletal: Negative.  Negative for joint swelling.  Skin: Negative.  Negative for rash.  Neurological: Negative.  Negative for headaches.  Hematological: Negative.  Does not bruise/bleed easily.  Psychiatric/Behavioral: Negative.  Negative for dysphoric mood. The patient is not nervous/anxious.    Past Medical History:  Diagnosis Date  . ASTHMA NOS W/ACUTE EXACERBATION 07/10/2010  . Cataract   . Cervical dysplasia   . CHEST PAIN 01/23/2007  . DEPRESSION 03/16/2007  . DIABETES MELLITUS, TYPE II 12/01/2006  . Dysmenorrhea   . Fibroid   . HYPERLIPIDEMIA 12/01/2006  . HYPERTENSION 07/07/2006  . Obesity   . Splenomegaly    in college  . 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 TIA (transient ischemic attack) 2012   (-) CA, DVT  Family History  Problem Relation Age of Onset  . Cancer Maternal Grandmother     Colon Cancer  . Asthma Maternal Grandmother   . Diabetes Father   . Heart disease Father   . Hypertension Father   . Hyperlipidemia Father   . Hypertension Mother   . Heart disease Mother   . Ovarian cancer Mother   . Cancer Mother     Lung cancer  . Asthma Mother   . COPD Mother   . Hyperlipidemia Mother   . Diabetes Brother   . Hypertension Brother   . Kidney disease Brother   . Asthma Brother   . Heart disease Brother   . Hyperlipidemia Brother   . Graves' disease Sister   .  Diabetes Sister   . Breast cancer Sister 48  . Graves' disease Paternal Grandmother   . Hypertension Paternal Grandmother   . Heart disease Paternal Grandmother   . Alzheimer's disease Paternal Grandmother   . Cancer Maternal Grandfather      Past Surgical History:  Procedure Laterality Date  . 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 APPENDECTOMY    . BIOPSY THYROID  05/02/11   Nonneoplastic goiter  . BREAST BIOPSY    . BREAST SURGERY     Reduction  . COLPOSCOPY    . DILATION AND CURETTAGE OF UTERUS  1975   DUB  . EYE SURGERY    .  GYNECOLOGIC CRYOSURGERY    . MYOMECTOMY    . OVARIAN CYST REMOVAL    . PELVIC LAPAROSCOPY  75,88   DL lysis of adhesions    Social History   Social History  . Marital status: Single    Spouse name: N/A  . Number of children: 2  . Years of education: N/A   Occupational History  . [employer]   Social History Main Topics  . Smoking status: Never Smoker  . Smokeless tobacco: Never Used  . Alcohol use 0.0 oz/week     Comment: very rare  . Drug use: No  . Sexual activity: Not Currently    Birth control/ protection: Post-menopausal     Comment: 1st intercourse 61 yo-Fewer than 5 partners   Other Topics Concern  . 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 Quinapril     cough     Outpatient Medications Prior to Visit  Medication Sig Dispense Refill  . albuterol (PROVENTIL HFA;VENTOLIN HFA) 108 (90 BASE) MCG/ACT inhaler Inhale 2 puffs into the lungs every 4 (four) hours as needed for wheezing or shortness of breath. 18 g 2  . albuterol (PROVENTIL HFA;VENTOLIN HFA) 108 (90 Base) MCG/ACT inhaler Inhale 2 puffs into the lungs every 4 (four) hours as needed for wheezing or shortness of breath. 1 Inhaler 2  . amLODipine (NORVASC) 10 MG tablet TAKE 10 MG BY MOUTH ONE TIME DAILY Keep appt w/new PCP for future refills 90 tablet 0  . blood glucose meter kit and supplies KIT One touch verio. Use up to four times daily as directed. (FOR ICD-9 250.00, 250.01). 1 each 0  . budesonide-formoterol (SYMBICORT) 160-4.5 MCG/ACT inhaler Inhale 2 puffs into the lungs 2 (two) times daily.    . clopidogrel (PLAVIX) 75 MG tablet Take 1 tablet (75 mg total) by mouth daily. 90 tablet 3  . Cod Liver Oil 1000 MG CAPS Take 1 capsule by mouth daily.    . Insulin Glargine (BASAGLAR KWIKPEN) 100 UNIT/ML SOPN Inject 4 mLs (400 Units total) into the skin every  morning. 105 mL 11  . Insulin Pen Needle 29G X 12MM MISC 1 Device by Does not apply route 2 (two) times daily. 100 each 11  . losartan (COZAAR) 100 MG tablet TAKE100 MG BY MOUTH ONE TIME DAILY Keep new appt w/new PCP for refills 90 tablet 0  . metoprolol succinate (TOPROL-XL) 100 MG 24 hr tablet TAKE 1 TABLET BY MOUTH DAILY WITH OR IMMEDIATELY FOLLOWING A MEAL 90 tablet 2  . montelukast (SINGULAIR) 10 MG tablet TAKE 1 TABLET BY MOUTH AT BEDTIME 30 tablet 5  .  Multiple Vitamin (MULTIVITAMIN) tablet Take 1 tablet by mouth daily.      . pravastatin (PRAVACHOL) 20 MG tablet Take 1 tablet (20 mg total) by mouth daily. 90 tablet 3  . spironolactone (ALDACTONE) 25 MG tablet Take 0.5 tablets (12.5 mg total) by mouth daily. 90 tablet 1  . triamcinolone cream (KENALOG) 0.1 % Apply 1 application topically 4 (four) times daily. As needed for itching 45 g 11   No facility-administered medications prior to visit.    No orders of the defined types were placed in this encounter.        Objective:   Physical Exam  Vitals:  Vitals:   07/26/16 1525  BP: (!) 144/86  Pulse: 98  SpO2: 98%  Weight: 255 lb 9.6 oz (115.9 kg)  Height: _0  (1.651 m)    Constitutional/General:  Pleasant, well-nourished, well-developed, not in any distress,  Comfortably seating.  Well kempt  Body mass index is 42.53 kg/m. Wt Readings from Last 3 Encounters:  07/26/16 255 lb 9.6 oz (115.9 kg)  07/17/16 259 lb (117.5 kg)  07/04/16 254 lb (115.2 kg)   HEENT: Pupils equal and reactive to light and accommodation. Anicteric sclerae. Normal nasal mucosa.   No oral  lesions,  mouth clear,  oropharynx clear, no postnasal drip. (-) Oral thrush. No dental caries.  Airway - Mallampati class IV.   Neck: No masses. Midline trachea. No JVD, (-) LAD. (-) bruits appreciated.  Respiratory/Chest: Grossly normal chest. (-) deformity. (-) Accessory muscle use.  Symmetric expansion. (-) Tenderness on palpation.  Resonant on  percussion.  Diminished BS on both lower lung zones. (-) wheezing, crackles, rhonchi (-) egophony  Cardiovascular: Regular rate and  rhythm, heart sounds normal, no murmur or gallops, no peripheral edema  Gastrointestinal:  Normal bowel sounds. Soft, non-tender. No hepatosplenomegaly.  (-) masses.   Musculoskeletal:  Normal muscle tone. Normal gait.   Extremities: Grossly normal. (-) clubbing, cyanosis.  (-) edema  Skin: (-) rash,lesions seen.   Neurological/Psychiatric : alert, oriented to time, place, person. Normal mood and affect          Assessment & Plan:  OSA (obstructive sleep apnea) She has snoring, occasional witnessed apneas, occasional gasping  Or choking. Sleeps 6 hrs / night. She teaches at [employer], employed by [employer] school.  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  (-) abnormal behavior in sleep.   ESS 13.  Since last seen, she had a split night sleep study which showed an AHI of 17. Optimal on 14 cm water. There was an issue with ordering her CPAP machine.  She is being scheduled for bariatric surgery.  Her brother has sleep apnea and uses CPAP.  Plan :  We extensively discussed the diagnosis, pathophysiology, and treatment options for Obstructive Sleep Apnea (OSA).   We discussed treatment options for OSA including CPAP, BiPaP, as well as surgical options and oral devices.   We will start patient on autocpap machine set at 14 cm water. We will order the machine today. Discussed with her importance of compliance and usage specifically she'll have surgery and will be on pain medications. We need to get a download on the CPAP machine to make sure that she is corrected prior to surgery. Once a download is good and she is compliant, he should be okay to undergo surgery. 6 months after bariatric surgery, she will need a repeat download and potentially cut down her pressure as she will likely need less CPAP pressure.  Patient  was instructed to call the office if he/she has not received the PAP device in 1-2 weeks.  Patient was instructed to have mask, tubings, filter, reservoir cleaned at least once a week with soapy water.  Patient was instructed to call the office if he/she is having issues with the PAP device.    I advised patient to obtain sufficient amount of sleep --  7 to 8 hours at least in a 24 hr period.  Patient was advised to follow good sleep hygiene.  Patient was advised NOT to engage in activities requiring concentration and/or vigilance if he/she is and  sleepy.  Patient is NOT to drive if he/she is sleepy.       Morbidly obese Weight reduction  Asthma Asthma has been stable. Uses alb prn. CXR in 2017 (-) changes.   Pre-operative clearance Patient is to be scheduled for bariatric surgery care of Dr. Gurney Maxin.   I extensively discussed with the patient her increased risks with bariatric surgery given her moderate sleep apnea. She is at increased risks for pulmonary complications given her sleep apnea and her asthma.  Patient understands and accepts the risks.   We need to make sure that she is compliant and she uses her cpap machine.  Need to get a download for the next month. We need to make sure that her asthma is stable and clinically it is. She uses albuterol every 4 hours as needed.  For now, anticipate no absolute contraindication to contemplated surgery (bariatric surgery) pending good compliance and usage of cpap.  She will need a CXR within 1 month of surgery.        Patient will follow up in 6 weeks or so. Needs final clearance then.     Monica Becton, MD 07/26/2016   3:57 PM [facility] [place] Pager: [phone] Office: [phone], Fax: [phone]

## 2016-07-26 NOTE — Patient Instructions (Addendum)
  It was a pleasure taking care of you today!  You are diagnosed with Obstructive Sleep Apnea or OSA.  You stop breathing  17  times an hour.   We will order you an autoCPAP  Machine, set at  14 cm H2O with a Medium size [employer] Full Face Mask Simplus mask and heated humidification.  Please call the office if you do NOT receive your machine in the next 1-2 weeks.   Please make sure you use your CPAP device everytime you sleep.  We will monitor the usage of your machine per your insurance requirement.  Your insurance company may take the machine from you if you are not using it regularly.   Please clean the mask, tubings, filter, water reservoir with soapy water every week.  Please use distilled water for the water reservoir.   Please call the office or your machine provider (DME company) if you are having issues with the device.   Return to clinic in 6-8 weeks with Dr. Corrie Dandy or NP.

## 2016-07-26 NOTE — Assessment & Plan Note (Signed)
Asthma has been stable. Uses alb prn. CXR in 2017 (-) changes.

## 2016-08-21 ENCOUNTER — Ambulatory Visit: Payer: BC Managed Care – PPO | Admitting: Pulmonary Disease

## 2016-09-04 ENCOUNTER — Encounter: Payer: Self-pay | Admitting: Pulmonary Disease

## 2016-09-08 ENCOUNTER — Telehealth: Payer: Self-pay | Admitting: Pulmonary Disease

## 2016-09-08 NOTE — Telephone Encounter (Signed)
   CPAP DL last month : 37%, AHI 2.4 cpap 14.  Jasmine : pls tell pt she needs to use cpap at least 4 hrs daily.   Thanks  J. Shirl Harris, MD 09/08/2016, 3:21 AM [place] [facility] Pager [phone] After 3 pm or if no answer, call [phone]

## 2016-09-10 NOTE — Telephone Encounter (Signed)
Spoke with patient and informed her of AD recommendations. Pt states she does not sleep much at night, but will try to work on sleeping more. Pt verbalized understanding. Nothing further is needed.

## 2016-10-01 ENCOUNTER — Encounter: Payer: Self-pay | Admitting: Endocrinology

## 2016-10-01 ENCOUNTER — Ambulatory Visit (INDEPENDENT_AMBULATORY_CARE_PROVIDER_SITE_OTHER): Payer: BC Managed Care – PPO | Admitting: Endocrinology

## 2016-10-01 ENCOUNTER — Other Ambulatory Visit: Payer: Self-pay | Admitting: Endocrinology

## 2016-10-01 VITALS — BP 160/90 | HR 100 | Ht 65.0 in | Wt 253.0 lb

## 2016-10-01 DIAGNOSIS — E1151 Type 2 diabetes mellitus with diabetic peripheral angiopathy without gangrene: Secondary | ICD-10-CM

## 2016-10-01 DIAGNOSIS — Z794 Long term (current) use of insulin: Secondary | ICD-10-CM | POA: Diagnosis not present

## 2016-10-01 LAB — POCT GLYCOSYLATED HEMOGLOBIN (HGB A1C): HEMOGLOBIN A1C: 9.5

## 2016-10-01 MED ORDER — BASAGLAR KWIKPEN 100 UNIT/ML ~~LOC~~ SOPN: 430.0000 [IU] | PEN_INJECTOR | SUBCUTANEOUS | 11 refills | Status: DC

## 2016-10-01 NOTE — Patient Instructions (Addendum)
On this type of insulin schedule, you should eat meals on a regular schedule.  If a meal is missed or significantly delayed, your blood sugar could go low.  Please increase the insulin to 430 units each morning, and:  Continue working toward the weight loss surgery.  check your blood sugar 2 times a day.  vary the time of day when you check, between before the 3 meals, and at bedtime.  also check if you have symptoms of your blood sugar being too high or too low.  please keep a record of the readings and bring it to your next appointment here.  please call us sooner if you are having low blood sugar episodes, or if it stays over 200.   Please come back for a follow-up appointment in 2 months, or sooner if you have the surgery.   Please continue the same medication for the blood pressure.

## 2016-10-01 NOTE — Progress Notes (Signed)
Subjective:    Patient ID: Rebekah Kennedy, female    DOB: February 23, 1956, 61 y.o.   MRN: [id number]  HPI Pt returns for f/u of diabetes mellitus: DM type: Insulin-requiring type 2 Dx'ed: 8115 Complications: CVA, nephropathy, polyneuropathy, and retinopathy.  Therapy: insulin since 2013 GDM: never DKA: never Severe hypoglycemia: never.  Pancreatitis: never.  Other: due to noncompliance with insulin injections and cbg monitoring, she is on a qd insulin regimen.   Interval history: Pt says she still misses the insulin approx twice per month  She takes 400 units qam.  no cbg record, but states cbg's are in the mid-100's.  It is in general higher as the day goes on.  pt states she feels well in general.  She has mild hypoglycemia approx twice a month.  This happens when she takes the insulin in the late afternoon, and misses the evening meal.    Past Medical History:  Diagnosis Date  . ASTHMA NOS W/ACUTE EXACERBATION 07/10/2010  . Cataract   . Cervical dysplasia   . CHEST PAIN 01/23/2007  . DEPRESSION 03/16/2007  . DIABETES MELLITUS, TYPE II 12/01/2006  . Dysmenorrhea   . Fibroid   . HYPERLIPIDEMIA 12/01/2006  . HYPERTENSION 07/07/2006  . Obesity   . Splenomegaly    in college  . 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 TIA (transient ischemic attack) 2012    Past Surgical History:  Procedure Laterality Date  . 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 APPENDECTOMY    . BIOPSY THYROID  05/02/11   Nonneoplastic goiter  . BREAST BIOPSY    . BREAST SURGERY     Reduction  . COLPOSCOPY    . DILATION AND CURETTAGE OF UTERUS  1975   DUB  . EYE SURGERY    . GYNECOLOGIC CRYOSURGERY    . MYOMECTOMY    . OVARIAN CYST REMOVAL    . PELVIC LAPAROSCOPY  75,88   DL lysis of adhesions    Social History   Social History  . Marital status: Single    Spouse name: N/A  . Number of children: 2  . Years of education: N/A   Occupational History  . [place]   Social History Main Topics  . Smoking status: Never Smoker  . Smokeless tobacco: Never Used  . Alcohol use 0.0 oz/week     Comment: very rare  . Drug use: No  . Sexual activity: Not Currently    Birth control/ protection: Post-menopausal     Comment: 1st intercourse 61 yo-Fewer than 5 partners   Other Topics Concern  . 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 albuterol (PROVENTIL HFA;VENTOLIN HFA) 108 (90 BASE) MCG/ACT inhaler Inhale 2 puffs into the lungs every 4 (four) hours as needed for wheezing or shortness of breath. 18 g 2  . amLODipine (NORVASC) 10 MG tablet TAKE 10 MG BY MOUTH ONE TIME DAILY Keep appt w/new PCP for future refills 90 tablet 0  . blood glucose meter kit and supplies KIT One touch verio. Use up to four times daily as directed. (FOR ICD-9 250.00, 250.01). 1 each 0  . clopidogrel (PLAVIX) 75 MG tablet Take 1 tablet (75 mg total)  by mouth daily. 90 tablet 3  . Cod Liver Oil 1000 MG CAPS Take 1 capsule by mouth daily.    . Insulin Pen Needle 29G X 12MM MISC 1 Device by Does not apply route 2 (two) times daily. 100 each 11  . losartan (COZAAR) 100 MG tablet TAKE100 MG BY MOUTH ONE TIME DAILY Keep new appt w/new PCP for refills 90 tablet 0  . metoprolol succinate (TOPROL-XL) 100 MG 24 hr tablet TAKE 1 TABLET BY MOUTH DAILY WITH OR IMMEDIATELY FOLLOWING A MEAL 90 tablet 2  . montelukast (SINGULAIR) 10 MG tablet TAKE 1 TABLET BY MOUTH AT BEDTIME 30 tablet 5  . Multiple Vitamin (MULTIVITAMIN) tablet Take 1 tablet by mouth daily.      . pravastatin (PRAVACHOL) 20 MG tablet Take 1 tablet (20 mg total) by mouth daily. 90 tablet 3  . spironolactone (ALDACTONE) 25 MG tablet Take 0.5 tablets (12.5 mg total) by mouth daily. 90 tablet 1  . triamcinolone cream (KENALOG) 0.1 %  Apply 1 application topically 4 (four) times daily. As needed for itching 45 g 11  . albuterol (PROVENTIL HFA;VENTOLIN HFA) 108 (90 Base) MCG/ACT inhaler Inhale 2 puffs into the lungs every 4 (four) hours as needed for wheezing or shortness of breath. (Patient not taking: Reported on 10/01/2016) 1 Inhaler 2  . budesonide-formoterol (SYMBICORT) 160-4.5 MCG/ACT inhaler Inhale 2 puffs into the lungs 2 (two) times daily.     No current facility-administered medications on file prior to visit.     Allergies  Allergen Reactions  . Quinapril     cough    Family History  Problem Relation Age of Onset  . Cancer Maternal Grandmother        Colon Cancer  . Asthma Maternal Grandmother   . Diabetes Father   . Heart disease Father   . Hypertension Father   . Hyperlipidemia Father   . Hypertension Mother   . Heart disease Mother   . Ovarian cancer Mother   . Cancer Mother        Lung cancer  . Asthma Mother   . COPD Mother   . Hyperlipidemia Mother   . Diabetes Brother   . Hypertension Brother   . Kidney disease Brother   . Asthma Brother   . Heart disease Brother   . Hyperlipidemia Brother   . Graves' disease Sister   . Diabetes Sister   . Breast cancer Sister 56  . Graves' disease Paternal Grandmother   . Hypertension Paternal Grandmother   . Heart disease Paternal Grandmother   . Alzheimer's disease Paternal Grandmother   . Cancer Maternal Grandfather     BP (!) 160/90   Pulse 100   Ht 5' 5"  (1.651 m)   Wt 253 lb (114.8 kg)   BMI 42.10 kg/m    Review of Systems Denies LOC    Objective:   Physical Exam VITAL SIGNS:  See vs page GENERAL: no distress Pulses: dorsalis pedis intact bilat.   MSK: no deformity of the feet CV: trace bilat leg edema. Skin:  no ulcer on the feet.  normal color and temp on the feet.  Neuro: sensation is intact to touch on the feet, but decreased from normal.   Ext: There is bilateral onychomycosis of the toenails.   A1c=9.5%       Assessment & Plan:  Insulin-requiring type 2 DM, with retinopathy: worse.  Noncompliance with cbg recording and insulin.  This complicates the rx of DM.  HTN: prob situational.  Obesity: she will have surgery soon.  Patient Instructions  On this type of insulin schedule, you should eat meals on a regular schedule.  If a meal is missed or significantly delayed, your blood sugar could go low.  Please increase the insulin to 430 units each morning, and:  Continue working toward the weight loss surgery.  check your blood sugar 2 times a day.  vary the time of day when you check, between before the 3 meals, and at bedtime.  also check if you have symptoms of your blood sugar being too high or too low.  please keep a record of the readings and bring it to your next appointment here.  please call us sooner if you are having low blood sugar episodes, or if it stays over 200.   Please come back for a follow-up appointment in 2 months, or sooner if you have the surgery.   Please continue the same medication for the blood pressure.

## 2016-10-03 ENCOUNTER — Encounter: Payer: Self-pay | Admitting: Gynecology

## 2016-10-04 ENCOUNTER — Ambulatory Visit: Payer: BC Managed Care – PPO | Admitting: Pulmonary Disease

## 2016-10-08 ENCOUNTER — Encounter: Payer: Self-pay | Admitting: Pulmonary Disease

## 2016-10-09 ENCOUNTER — Other Ambulatory Visit: Payer: Self-pay | Admitting: Internal Medicine

## 2016-10-10 ENCOUNTER — Encounter: Payer: Self-pay | Admitting: Pulmonary Disease

## 2016-10-10 ENCOUNTER — Ambulatory Visit (INDEPENDENT_AMBULATORY_CARE_PROVIDER_SITE_OTHER): Payer: BC Managed Care – PPO | Admitting: Pulmonary Disease

## 2016-10-10 DIAGNOSIS — G4733 Obstructive sleep apnea (adult) (pediatric): Secondary | ICD-10-CM | POA: Diagnosis not present

## 2016-10-10 DIAGNOSIS — Z01818 Encounter for other preprocedural examination: Secondary | ICD-10-CM

## 2016-10-10 DIAGNOSIS — J45909 Unspecified asthma, uncomplicated: Secondary | ICD-10-CM | POA: Diagnosis not present

## 2016-10-10 NOTE — Assessment & Plan Note (Signed)
Asthma has been stable. Uses alb prn. CXR in 2017 (-) changes.

## 2016-10-10 NOTE — Patient Instructions (Signed)
  It was a pleasure taking care of you today!  Continue using your CPAP machine.   Please make sure you use your CPAP device everytime you sleep.  We will monitor the usage of your machine per your insurance requirement.  Your insurance company may take the machine from you if you are not using it regularly.   Please clean the mask, tubings, filter, water reservoir with soapy water every week.  Please use distilled water for the water reservoir.   Please call the office or your machine provider (DME company) if you are having issues with the device.   Return to clinic in 6 months    with  NP

## 2016-10-10 NOTE — Addendum Note (Signed)
Addended by: Benson Setting L on: 10/10/2016 11:32 AM   Modules accepted: Orders

## 2016-10-10 NOTE — Assessment & Plan Note (Signed)
Morbid obesity  

## 2016-10-10 NOTE — Assessment & Plan Note (Addendum)
Patient is to be scheduled for bariatric surgery care of Dr. Gurney Maxin.   I extensively discussed with the patient her increased risks with bariatric surgery given her moderate sleep apnea and her asthma. She is at increased risks for pulmonary complications given her sleep apnea and her asthma.  Patient understands and accepts the risks. Her sleep apnea is controlled and she is compliant with CPAP. Her asthma is also controlled and stable.  At present, foresee no absolute contraindications to contemplated bariatric surgery. I suggest getting a chest x-ray within a month of surgery. As I do not have a date yet, I told the patient to give [place] a call once she has a date for surgery.  Please let us know if patient ends up being admitted to the hospital with surgery. We can follow her postoperatively. Most likely she will need to be on BiPAP postop in the ICU.

## 2016-10-10 NOTE — Progress Notes (Signed)
Subjective:    Patient ID: Rebekah Kennedy, female    DOB: 06/08/1955, 61 y.o.   MRN: [id number]  HPI   This is the case of Rebekah Kennedy, 61 y.o. Female, who was referred by Dr. Gurney Maxin in consultation regarding possible OSA and pre-operative clearance for contemplated bariatric surgery.   As you very well know, patient is a non smoker.  She is diagnosed with asthma, all her life. Triggers: cigarette smoke, dust, change in weather, pollen. She is not on maintenance meds.  The cold weather has made her use alb 1x/week. Last ED visit for asthma was yrs ago.  Not been intubated for asthma.   She has snoring, occasional witnessed apneas, occasional gasping  Or choking. Sleeps 6 hrs / night. She teaches at [employer], employed by [employer] school.  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  (-) abnormal behavior in sleep.   ESS 13.   (-) abnormal behavior in school.   ROV 07/26/16 Patient returns to the office as follow-up on her sleep apnea. Since last seen, she had a sleep study which showed AHI of 17 and optimal CPAP of 14 cm water. There is an issue with ordering her CPAP machine. Anyway, other than sleep apnea symptoms, she remains well. Her asthma has been stable. No schedule has been set with regards to her bariatric surgery. Has not been admitted nor been sick since last seen.  ROV 10/10/2016 Patient returns to the office as follow-up on her sleep apnea. Since last seen, she has been doing well. Uses her CPAP machine. Download the last month: 77%, AHI 2. She is on CPAP 14 cm water. She feels better using it. More energy. Less sleepiness. Asthma has been stable. Does not have schedule for her bariatric surgery yet.  Review of Systems  Constitutional: Negative.  Negative for fever and unexpected weight change.  HENT: Negative.  Negative for congestion, dental problem, ear pain, nosebleeds, postnasal drip, rhinorrhea, sinus pressure, sneezing, sore throat and  trouble swallowing.   Eyes: Negative.  Negative for redness and itching.  Respiratory: Positive for wheezing. Negative for cough, chest tightness and shortness of breath.   Cardiovascular: Positive for leg swelling. Negative for palpitations.  Gastrointestinal: Negative.  Negative for nausea and vomiting.  Genitourinary: Negative.  Negative for dysuria.  Musculoskeletal: Negative.  Negative for joint swelling.  Skin: Negative.  Negative for rash.  Neurological: Negative.  Negative for headaches.  Hematological: Negative.  Does not bruise/bleed easily.  Psychiatric/Behavioral: Negative.  Negative for dysphoric mood. The patient is not nervous/anxious.       Objective:   Physical Exam  Vitals:  Vitals:   10/10/16 1018  BP: 126/80  Pulse: 97  SpO2: 96%  Weight: 253 lb 9.6 oz (115 kg)  Height: 5\' 5"  (1.651 m)    Constitutional/General:  Pleasant, well-nourished, well-developed, not in any distress,  Comfortably seating.  Well kempt  Body mass index is 42.2 kg/m. Wt Readings from Last 3 Encounters:  10/10/16 253 lb 9.6 oz (115 kg)  10/01/16 253 lb (114.8 kg)  07/26/16 255 lb 9.6 oz (115.9 kg)   HEENT: Pupils equal and reactive to light and accommodation. Anicteric sclerae. Normal nasal mucosa.   No oral  lesions,  mouth clear,  oropharynx clear, no postnasal drip. (-) Oral thrush. No dental caries.  Airway - Mallampati class IV.   Neck: No masses. Midline trachea. No JVD, (-) LAD. (-) bruits appreciated.  Respiratory/Chest: Grossly normal chest. (-) deformity. (-)  Accessory muscle use.  Symmetric expansion. (-) Tenderness on palpation.  Resonant on percussion.  Diminished BS on both lower lung zones. (-) wheezing, crackles, rhonchi (-) egophony  Cardiovascular: Regular rate and  rhythm, heart sounds normal, no murmur or gallops, no peripheral edema  Gastrointestinal:  Normal bowel sounds. Soft, non-tender. No hepatosplenomegaly.  (-) masses.   Musculoskeletal:   Normal muscle tone. Normal gait.   Extremities: Grossly normal. (-) clubbing, cyanosis.  (-) edema  Skin: (-) rash,lesions seen.   Neurological/Psychiatric : alert, oriented to time, place, person. Normal mood and affect          Assessment & Plan:  OSA (obstructive sleep apnea) She has snoring, occasional witnessed apneas, occasional gasping  Or choking. Sleeps 6 hrs / night. She teaches at [employer], employed by [employer] school.  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  (-) abnormal behavior in sleep.   ESS 13.  Patient  had a split night sleep study which showed an AHI of 17. Optimal on 14 cm water. She is using her CPAP machine. Feels better using it. More energy. Less sleepiness. She has nasal pillows and she tolerates the pressure. Download last month: 77%, AHI 2.  She is to be scheduled for bariatric surgery.   Plan :  We extensively discussed the importance of treating OSA and the need to use PAP therapy.   Continue with cpap 14 cm water. Good compliance. Patient is being planned to have bariatric surgery. I told her to continue current settings. Once after surgery, once she is at her goal weight, we need to revisit her settings. She may end up needing a lower pressure on cpap.    Patient was instructed to have mask, tubings, filter, reservoir cleaned at least once a week with soapy water.  Patient was instructed to call the office if he/she is having issues with the PAP device.    I advised patient to obtain sufficient amount of sleep --  7 to 8 hours at least in a 24 hr period.  Patient was advised to follow good sleep hygiene.  Patient was advised NOT to engage in activities requiring concentration and/or vigilance if he/she is and  sleepy.  Patient is NOT to drive if he/she is sleepy.     Asthma Asthma has been stable. Uses alb prn. CXR in 2017 (-) changes.   Morbid obesity due to excess calories ([place]) Morbid  obesity   Pre-operative clearance Patient is to be scheduled for bariatric surgery care of Dr. Gurney Maxin.   I extensively discussed with the patient her increased risks with bariatric surgery given her moderate sleep apnea and her asthma. She is at increased risks for pulmonary complications given her sleep apnea and her asthma.  Patient understands and accepts the risks. Her sleep apnea is controlled and she is compliant with CPAP. Her asthma is also controlled and stable.  At present, foresee no absolute contraindications to contemplated bariatric surgery. I suggest getting a chest x-ray within a month of surgery. As I do not have a date yet, I told the patient to give [place] a call once she has a date for surgery.  Please let us know if patient ends up being admitted to the hospital with surgery. We can follow her postoperatively. Most likely she will need to be on BiPAP postop in the ICU.        Patient will follow up in 3-4 months after bariatric surgery.     Elsie Saas  Radford Pax, MD 10/10/2016   10:57 AM Pulmonary and [place] Pager: [phone] Office: [phone], Fax: [phone]

## 2016-10-10 NOTE — Assessment & Plan Note (Addendum)
She has snoring, occasional witnessed apneas, occasional gasping  Or choking. Sleeps 6 hrs / night. She teaches at [employer], employed by [employer] school.  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  (-) abnormal behavior in sleep.   ESS 13.  Patient  had a split night sleep study which showed an AHI of 17. Optimal on 14 cm water. She is using her CPAP machine. Feels better using it. More energy. Less sleepiness. She has nasal pillows and she tolerates the pressure. Download last month: 77%, AHI 2.  She is to be scheduled for bariatric surgery.   Plan :  We extensively discussed the importance of treating OSA and the need to use PAP therapy.   Continue with cpap 14 cm water. Good compliance. Patient is being planned to have bariatric surgery. I told her to continue current settings. Once after surgery, once she is at her goal weight, we need to revisit her settings. She may end up needing a lower pressure on cpap.    Patient was instructed to have mask, tubings, filter, reservoir cleaned at least once a week with soapy water.  Patient was instructed to call the office if he/she is having issues with the PAP device.    I advised patient to obtain sufficient amount of sleep --  7 to 8 hours at least in a 24 hr period.  Patient was advised to follow good sleep hygiene.  Patient was advised NOT to engage in activities requiring concentration and/or vigilance if he/she is and  sleepy.  Patient is NOT to drive if he/she is sleepy.

## 2016-10-12 ENCOUNTER — Telehealth: Payer: Self-pay | Admitting: Pulmonary Disease

## 2016-10-12 NOTE — Telephone Encounter (Signed)
Will forward this information over to AD---  Dr. Gurney Maxin, [facility], [address], [place].  AD is there anything further that you need [place] to help with?  Thanks

## 2016-10-12 NOTE — Telephone Encounter (Signed)
I faxed the notes. Will close this message

## 2016-10-12 NOTE — Telephone Encounter (Signed)
Jasmine, were you  able to forward my progress note to Dr. Gurney Maxin? If yes, the sign off on this encounter. Thank you.   See below re: Dr's info (fax number)  J. Shirl Harris, MD 10/12/2016, 3:58 PM [place] [facility] Pager [phone] After 3 pm or if no answer, call [phone]

## 2016-10-18 ENCOUNTER — Encounter: Payer: Self-pay | Admitting: Internal Medicine

## 2016-10-18 LAB — HM MAMMOGRAPHY

## 2016-10-25 ENCOUNTER — Encounter: Payer: Self-pay | Admitting: Internal Medicine

## 2016-12-03 ENCOUNTER — Ambulatory Visit (INDEPENDENT_AMBULATORY_CARE_PROVIDER_SITE_OTHER): Payer: BC Managed Care – PPO | Admitting: Endocrinology

## 2016-12-03 ENCOUNTER — Encounter: Payer: Self-pay | Admitting: Endocrinology

## 2016-12-03 ENCOUNTER — Ambulatory Visit: Payer: BC Managed Care – PPO | Admitting: Endocrinology

## 2016-12-03 VITALS — BP 142/88 | HR 96 | Ht 65.0 in | Wt 250.0 lb

## 2016-12-03 DIAGNOSIS — Z794 Long term (current) use of insulin: Secondary | ICD-10-CM | POA: Diagnosis not present

## 2016-12-03 DIAGNOSIS — E1151 Type 2 diabetes mellitus with diabetic peripheral angiopathy without gangrene: Secondary | ICD-10-CM

## 2016-12-03 LAB — POCT GLYCOSYLATED HEMOGLOBIN (HGB A1C): Hemoglobin A1C: 9.4

## 2016-12-03 MED ORDER — BASAGLAR KWIKPEN 100 UNIT/ML ~~LOC~~ SOPN: 460.0000 [IU] | PEN_INJECTOR | SUBCUTANEOUS | 11 refills | Status: DC

## 2016-12-03 NOTE — Patient Instructions (Addendum)
On this type of insulin schedule, you should eat meals on a regular schedule.  If a meal is missed or significantly delayed, your blood sugar could go low.  Please increase the insulin to 460 units each morning, and:  Continue working toward the weight loss surgery.  check your blood sugar twice a day.  vary the time of day when you check, between before the 3 meals, and at bedtime.  also check if you have symptoms of your blood sugar being too high or too low.  please keep a record of the readings and bring it to your next appointment here.  please call us sooner if you are having low blood sugar episodes, or if it stays over 200.   Please come back for a follow-up appointment in 6 weeks.    Please continue the same medication for the blood pressure.

## 2016-12-03 NOTE — Progress Notes (Signed)
Subjective:    Patient ID: Rebekah Kennedy, female    DOB: February 23, 1956, 61 y.o.   MRN: [id number]  HPI Pt returns for f/u of diabetes mellitus:  DM type: Insulin-requiring type 2.  Dx'ed: 2004.  Complications: CVA, nephropathy, polyneuropathy, and prolif retinopathy.  Therapy: insulin since 2013 GDM: never.  DKA: never Severe hypoglycemia: never.  Pancreatitis: never.  Other: due to noncompliance with insulin injections and cbg monitoring, she is on a qd insulin regimen.   Interval history: Pt says she seldom misses the insulin.  She will have gastric bypass in 3 weeks.  She has had only 1 episode of hypoglycemia, and this was mild.  It happened after she misses lunch.  no cbg record, but states cbg's vary from 62-300's.   She did not take BP rx this am.   Past Medical History:  Diagnosis Date  . ASTHMA NOS W/ACUTE EXACERBATION 07/10/2010  . Cataract   . Cervical dysplasia   . CHEST PAIN 01/23/2007  . DEPRESSION 03/16/2007  . DIABETES MELLITUS, TYPE II 12/01/2006  . Dysmenorrhea   . Fibroid   . HYPERLIPIDEMIA 12/01/2006  . HYPERTENSION 07/07/2006  . Obesity   . Splenomegaly    in college  . 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 TIA (transient ischemic attack) 2012    Past Surgical History:  Procedure Laterality Date  . 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 APPENDECTOMY    . BIOPSY THYROID  05/02/11   Nonneoplastic goiter  . BREAST BIOPSY    . BREAST SURGERY     Reduction  . COLPOSCOPY    . DILATION AND CURETTAGE OF UTERUS  1975   DUB  . EYE SURGERY    . GYNECOLOGIC CRYOSURGERY    . MYOMECTOMY    . OVARIAN CYST REMOVAL    . PELVIC LAPAROSCOPY  75,88   DL lysis of adhesions    Social History   Social History  . Marital status: Single    Spouse name: N/A  . Number of children: 2  . Years of education: N/A   Occupational History  . [place]   Social History Main Topics  . Smoking status: Never Smoker  .  Smokeless tobacco: Never Used  . Alcohol use 0.0 oz/week     Comment: very rare  . Drug use: No  . Sexual activity: Not Currently    Birth control/ protection: Post-menopausal     Comment: 1st intercourse 61 yo-Fewer than 5 partners   Other Topics Concern  . 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 albuterol (PROVENTIL HFA;VENTOLIN HFA) 108 (90 BASE) MCG/ACT inhaler Inhale 2 puffs into the lungs every 4 (four) hours as needed for wheezing or shortness of breath. 18 g 2  . amLODipine (NORVASC) 10 MG tablet TAKE ONE TABLET BY MOUTH ONE TIME DAILY 90 tablet 1  . BD ULTRA-FINE PEN NEEDLES 29G X 12.7MM MISC USE 3 TIMES DAILY AS DIRECTED 100 each 1  . blood glucose meter kit and supplies KIT One touch verio. Use up to four times daily as directed. (FOR ICD-9 250.00, 250.01). 1 each 0  . budesonide-formoterol (SYMBICORT) 160-4.5 MCG/ACT inhaler Inhale 2 puffs into the lungs 2 (  two) times daily.    . clopidogrel (PLAVIX) 75 MG tablet Take 1 tablet (75 mg total) by mouth daily. 90 tablet 3  . Cod Liver Oil 1000 MG CAPS Take 1 capsule by mouth daily.    . Insulin Pen Needle 29G X 12MM MISC 1 Device by Does not apply route 2 (two) times daily. 100 each 11  . losartan (COZAAR) 100 MG tablet TAKE 1 TAB BY MOUTH ONCE DAILY. 90 tablet 1  . metoprolol succinate (TOPROL-XL) 100 MG 24 hr tablet TAKE 1 TABLET BY MOUTH DAILY WITH OR IMMEDIATELY FOLLOWING A MEAL 90 tablet 2  . montelukast (SINGULAIR) 10 MG tablet TAKE 1 TABLET BY MOUTH AT BEDTIME 30 tablet 5  . Multiple Vitamin (MULTIVITAMIN) tablet Take 1 tablet by mouth daily.      . pravastatin (PRAVACHOL) 20 MG tablet Take 1 tablet (20 mg total) by mouth daily. 90 tablet 3  . spironolactone (ALDACTONE) 25 MG tablet Take 0.5 tablets (12.5 mg total) by mouth daily. 90  tablet 1  . triamcinolone cream (KENALOG) 0.1 % Apply 1 application topically 4 (four) times daily. As needed for itching 45 g 11   No current facility-administered medications on file prior to visit.     Allergies  Allergen Reactions  . Quinapril     cough    Family History  Problem Relation Age of Onset  . Cancer Maternal Grandmother        Colon Cancer  . Asthma Maternal Grandmother   . Diabetes Father   . Heart disease Father   . Hypertension Father   . Hyperlipidemia Father   . Hypertension Mother   . Heart disease Mother   . Ovarian cancer Mother   . Cancer Mother        Lung cancer  . Asthma Mother   . COPD Mother   . Hyperlipidemia Mother   . Diabetes Brother   . Hypertension Brother   . Kidney disease Brother   . Asthma Brother   . Heart disease Brother   . Hyperlipidemia Brother   . Graves' disease Sister   . Diabetes Sister   . Breast cancer Sister 59  . Graves' disease Paternal Grandmother   . Hypertension Paternal Grandmother   . Heart disease Paternal Grandmother   . Alzheimer's disease Paternal Grandmother   . Cancer Maternal Grandfather     BP (!) 142/88   Pulse 96   Ht 5' 5"  (1.651 m)   Wt 250 lb (113.4 kg)   SpO2 96%   BMI 41.60 kg/m   Review of Systems She denies LOC.      Objective:   Physical Exam VITAL SIGNS:  See vs page GENERAL: no distress Pulses: foot pulses are intact bilaterally.   MSK: no deformity of the feet or ankles.  CV: trace bilat edema of the legs and ankles.  Skin:  no ulcer on the feet or ankles.  normal color and temp on the feet and ankles Neuro: sensation is intact to touch on the feet and ankles, but decreased from normal.    Lab Results  Component Value Date   HGBA1C 9.4 12/03/2016      Assessment & Plan:  Insulin-requiring type 2 DM, with DR:  She needs increased rx.  Obesity: she will have surgery soon. HTN: mild exacerbation.  Noncompliance with cbg recording, BP rx, and insulin.   Patient  Instructions  On this type of insulin schedule, you should eat meals on a regular schedule.  If a meal is missed or significantly delayed, your blood sugar could go low.  Please increase the insulin to 460 units each morning, and:  Continue working toward the weight loss surgery.  check your blood sugar twice a day.  vary the time of day when you check, between before the 3 meals, and at bedtime.  also check if you have symptoms of your blood sugar being too high or too low.  please keep a record of the readings and bring it to your next appointment here.  please call us sooner if you are having low blood sugar episodes, or if it stays over 200.   Please come back for a follow-up appointment in 6 weeks.    Please continue the same medication for the blood pressure.

## 2016-12-06 ENCOUNTER — Telehealth: Payer: Self-pay | Admitting: Skilled Nursing Facility1

## 2016-12-06 NOTE — Telephone Encounter (Signed)
Dietitian called pt to make sure she was prepared for her upcoming surgery due to the fact she has not been in pre-op since December.  Dietitian reviewed the information with the pt and educated her on the modified pre-op diet due to her insulin and diabetes.   Pt states she feels prepared and will call if she has any questions.

## 2016-12-10 ENCOUNTER — Telehealth: Payer: Self-pay | Admitting: Skilled Nursing Facility1

## 2016-12-10 ENCOUNTER — Encounter: Payer: Self-pay | Admitting: Registered"

## 2016-12-10 ENCOUNTER — Encounter: Payer: BC Managed Care – PPO | Attending: General Surgery | Admitting: Registered"

## 2016-12-10 ENCOUNTER — Encounter: Payer: Self-pay | Admitting: Endocrinology

## 2016-12-10 ENCOUNTER — Ambulatory Visit: Payer: Self-pay | Admitting: General Surgery

## 2016-12-10 DIAGNOSIS — E669 Obesity, unspecified: Secondary | ICD-10-CM | POA: Diagnosis not present

## 2016-12-10 DIAGNOSIS — Z713 Dietary counseling and surveillance: Secondary | ICD-10-CM | POA: Diagnosis not present

## 2016-12-10 DIAGNOSIS — E119 Type 2 diabetes mellitus without complications: Secondary | ICD-10-CM

## 2016-12-10 NOTE — Patient Instructions (Addendum)
-   Check blood sugar 3-4x/day: fasting and 2 hours after meals. Record numbers and keep record.   - Aim to increase physical activity to at least 30 min/day, 5 days/week.

## 2016-12-10 NOTE — Telephone Encounter (Signed)
Done in error.

## 2016-12-10 NOTE — Progress Notes (Signed)
Appt start time: 5:00 end time: 5:25  Assessment: 1st SWL Appointment.   Start Wt at NDES: 254 Wt: 244.9 BMI: 40.75   Pt arrives having lost 6.1 lbs from last weight taken on 05/03/2016. Pt reports having a tentative surgery date 12/25/2016. Pt states she has started pre-op diet today but comes in today for refresher on pre-op diet information and to receive the pre-op diet because she misplaced her original copy. Pt states she checks BS 1-2x/day: FBS (120-130) after meals (180-230). Pt reports last A1c (last week): 9.5.   MEDICATIONS: See list   DIETARY INTAKE:  24-hr recall:  B ( AM): sometimes skips or shake or boiled egg, toast Snk ( AM): banana L ( PM): chicken wings, rice or meat, 2 vegetables,  Snk ( PM): none D ( PM): [employer]-burrito, fiesta potatoes Snk ( PM): empenada Beverages: diet Dr. Malachi Bonds  Usual physical activity: some walking   Diet to Follow: 1600 calories 180 g carbohydrates 120 g protein 44 g fat  Preferred Learning Style:   No preference indicated   Learning Readiness:   Ready  Change in progress     Nutritional Diagnosis:  [place]-3.3 Overweight/obesity related to past poor dietary habits and physical inactivity as evidenced by patient w/ planned RYGB surgery following dietary guidelines for continued weight loss.    Intervention:  Nutrition counseling for upcoming Bariatric Surgery.  Goals:  - Check blood sugar 3-4x/day: fasting and 2 hours after meals. Record numbers and keep record.  - Aim to increase physical activity to at least 30 min/day,  5 days/week.  Teaching Method Utilized:  Visual Auditory Hands on  Handouts given during visit include:  Pre-op packet   Barriers to learning/adherence to lifestyle change: none  Demonstrated degree of understanding via:  Teach Back   Monitoring/Evaluation:  Dietary intake, exercise, and body weight prn.

## 2016-12-10 NOTE — Telephone Encounter (Signed)
Pt called stating she did not have the pre-op diet materials and needed new ones.  Dietitian asked if she wanted to come in for an appointment to refresh her and get the materials.  Pt states she does want to work with the dietitian so Dietitian set up an appointment for her today.

## 2016-12-19 ENCOUNTER — Encounter: Payer: Self-pay | Admitting: Family Medicine

## 2016-12-19 ENCOUNTER — Ambulatory Visit (INDEPENDENT_AMBULATORY_CARE_PROVIDER_SITE_OTHER): Payer: BC Managed Care – PPO | Admitting: Family Medicine

## 2016-12-19 VITALS — BP 130/88 | HR 93 | Temp 98.7°F | Ht 65.0 in | Wt 242.0 lb

## 2016-12-19 DIAGNOSIS — M7062 Trochanteric bursitis, left hip: Secondary | ICD-10-CM

## 2016-12-19 DIAGNOSIS — M7061 Trochanteric bursitis, right hip: Secondary | ICD-10-CM

## 2016-12-19 MED ORDER — MELOXICAM 15 MG PO TABS: 15.0000 mg | ORAL_TABLET | Freq: Every day | ORAL | 0 refills | Status: DC | PRN

## 2016-12-19 NOTE — Progress Notes (Signed)
Rebekah Kennedy - 61 y.o. female MRN [id number]  Date of birth: 1956-02-19  SUBJECTIVE:  Including CC & ROS.  Chief Complaint  Patient presents with  . Hip Pain    X1 week Sporadic sharp pain in right hip, does not hurt to walk unless the sharp pain happens, sharp pains are about a 8/10    Rebekah Kennedy a 61 year old female that is presenting with bilateral hip pain. The pain is more predominant on the right hip but also present on the anterior thigh. She denies any radicular symptoms. She denies any injury. She denies any history of any surgery on her hip or her back. This is been occurring for about one week. The pain is sharp in nature. She has not tried any medications. She has tried BenGay to rub on this with limited improvement.   Review of Systems  Musculoskeletal: Positive for myalgias. Negative for back pain, gait problem and joint swelling.  Skin: Negative for rash.  Neurological: Negative for seizures and weakness.   otherwise negative  HISTORY: Past Medical, Surgical, Social, and Family History Reviewed & Updated per EMR.   Pertinent Historical Findings include:  Past Medical History:  Diagnosis Date  . ASTHMA NOS W/ACUTE EXACERBATION 07/10/2010  . Cataract   . Cervical dysplasia   . CHEST PAIN 01/23/2007  . DEPRESSION 03/16/2007  . DIABETES MELLITUS, TYPE II 12/01/2006  . Dysmenorrhea   . Fibroid   . HYPERLIPIDEMIA 12/01/2006  . HYPERTENSION 07/07/2006  . Obesity   . Splenomegaly    in college  . 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 TIA (transient ischemic attack) 2012    Past Surgical History:  Procedure Laterality Date  . 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 APPENDECTOMY    . BIOPSY THYROID  05/02/11   Nonneoplastic goiter  . BREAST BIOPSY    . BREAST SURGERY     Reduction  . COLPOSCOPY    . DILATION AND CURETTAGE OF UTERUS  1975   DUB  . EYE SURGERY    . GYNECOLOGIC CRYOSURGERY    . MYOMECTOMY    . OVARIAN CYST REMOVAL    .  PELVIC LAPAROSCOPY  75,88   DL lysis of adhesions    Allergies  Allergen Reactions  . Quinapril     cough    Family History  Problem Relation Age of Onset  . Cancer Maternal Grandmother        Colon Cancer  . Asthma Maternal Grandmother   . Diabetes Father   . Heart disease Father   . Hypertension Father   . Hyperlipidemia Father   . Hypertension Mother   . Heart disease Mother   . Ovarian cancer Mother   . Cancer Mother        Lung cancer  . Asthma Mother   . COPD Mother   . Hyperlipidemia Mother   . Diabetes Brother   . Hypertension Brother   . Kidney disease Brother   . Asthma Brother   . Heart disease Brother   . Hyperlipidemia Brother   . Graves' disease Sister   . Diabetes Sister   . Breast cancer Sister 61  . Graves' disease Paternal Grandmother   . Hypertension Paternal Grandmother   . Heart disease Paternal Grandmother   . Alzheimer's disease Paternal Grandmother   . Cancer Maternal Grandfather      Social History   Social History  . Marital status: Single    Spouse  name: N/A  . Number of children: 2  . Years of education: N/A   Occupational History  . [employer]   Social History Main Topics  . Smoking status: Never Smoker  . Smokeless tobacco: Never Used  . Alcohol use 0.0 oz/week     Comment: very rare  . Drug use: No  . Sexual activity: Not Currently    Birth control/ protection: Post-menopausal     Comment: 1st intercourse 61 yo-Fewer than 5 partners   Other Topics Concern  . 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   Lives with with two children (16, 20)           PHYSICAL EXAM:  VS: BP 130/88 (BP Location: Left Arm, Patient Position: Sitting, Cuff Size: Large)   Pulse 93   Temp 98.7 F (37.1 C) (Oral)   Ht 5\' 5"  (1.651 m)   Wt 242 lb (109.8 kg)   SpO2 98%   BMI 40.27 kg/m  Physical Exam Gen: NAD, alert, cooperative with exam, well-appearing ENT:  normal lips, normal nasal mucosa,  Eye: PERRL, normal conjunctiva and lids CV:  no edema, +2 pedal pulses   Resp: no accessory muscle use, non-labored,   Skin: no rashes, no areas of induration  Neuro: +2 patellar DTR's, normal sensation to touch Psych:  normal insight, alert and oriented MSK:  Back/Hips:  No tenderness to palpation of the lumbar spine, paraspinal muscles, or SI joint. Some tenderness to palpation over the greater trochanter bilaterally. Negative Tinel's over the lateral cutaneous on the right. Normal internal and external rotation of the hips bilaterally. Normal strength with hip flexion. Weakness with hip abduction. Normal knee flexion and extension strength resistance. Normal straight leg raise bilaterally. Neurovascularly intact     ASSESSMENT & PLAN:   Greater trochanteric bursitis of both hips She has weakness with hip abduction which is likely contributing to this. She denies any injury. Does not seem to be a component of her back. - Mobic sent in - Provided home exercises - If there is no improvement Consider physical therapy versus injection - May need to get plain films of her hips or her back.

## 2016-12-19 NOTE — Patient Instructions (Signed)
Thank you for coming in,   Please try the exercises and rubbing lidocaine cream over this area. Please follow-up with me if the pain does not have any improvement.   Please feel free to call with any questions or concerns at any time, at [phone]. --Dr. Raeford Razor

## 2016-12-19 NOTE — Assessment & Plan Note (Signed)
She has weakness with hip abduction which is likely contributing to this. She denies any injury. Does not seem to be a component of her back. - Mobic sent in - Provided home exercises - If there is no improvement Consider physical therapy versus injection - May need to get plain films of her hips or her back.

## 2017-01-14 ENCOUNTER — Ambulatory Visit: Payer: BC Managed Care – PPO | Admitting: Endocrinology

## 2017-01-15 ENCOUNTER — Encounter: Payer: Self-pay | Admitting: Internal Medicine

## 2017-01-15 ENCOUNTER — Ambulatory Visit (INDEPENDENT_AMBULATORY_CARE_PROVIDER_SITE_OTHER): Payer: BC Managed Care – PPO | Admitting: Internal Medicine

## 2017-01-15 VITALS — BP 170/100 | HR 93 | Temp 98.3°F | Resp 16 | Wt 243.0 lb

## 2017-01-15 DIAGNOSIS — Z8673 Personal history of transient ischemic attack (TIA), and cerebral infarction without residual deficits: Secondary | ICD-10-CM

## 2017-01-15 DIAGNOSIS — E1151 Type 2 diabetes mellitus with diabetic peripheral angiopathy without gangrene: Secondary | ICD-10-CM

## 2017-01-15 DIAGNOSIS — Z794 Long term (current) use of insulin: Secondary | ICD-10-CM

## 2017-01-15 DIAGNOSIS — I10 Essential (primary) hypertension: Secondary | ICD-10-CM

## 2017-01-15 DIAGNOSIS — E7800 Pure hypercholesterolemia, unspecified: Secondary | ICD-10-CM | POA: Diagnosis not present

## 2017-01-15 MED ORDER — SPIRONOLACTONE 25 MG PO TABS: 25.0000 mg | ORAL_TABLET | Freq: Every day | ORAL | 1 refills | Status: DC

## 2017-01-15 MED ORDER — MONTELUKAST SODIUM 10 MG PO TABS: 10.0000 mg | ORAL_TABLET | Freq: Every day | ORAL | 3 refills | Status: DC

## 2017-01-15 NOTE — Assessment & Plan Note (Signed)
BP elevated and has been frequently elevated Inc spironolactone to 25 mg daily Continue losartan and amlodipine

## 2017-01-15 NOTE — Assessment & Plan Note (Addendum)
Lab Results  Component Value Date   HGBA1C 9.4 12/03/2016   Management per Dr Loanne Drilling Stressed increasing exercise and better compliance with a diabetic diet Will likely have gastric sleeve in next couple of months

## 2017-01-15 NOTE — Patient Instructions (Addendum)
   No immunizations administered today.   Medications reviewed and updated.  Changes include increasing spironolactone to 25 mg daily.   Your prescription(s) have been submitted to your pharmacy. Please take as directed and contact our office if you believe you are having problem(s) with the medication(s).    Please followup in 6 months

## 2017-01-15 NOTE — Assessment & Plan Note (Signed)
Continue statin. 

## 2017-01-15 NOTE — Assessment & Plan Note (Signed)
Will have gastric sleeve in the next few months Stressed regular exercise and healthy diet to get into good habits

## 2017-01-15 NOTE — Assessment & Plan Note (Signed)
Continue plavix Continue statin - should be increased but since she is having gastric sleeve we will hold off

## 2017-01-15 NOTE — Progress Notes (Signed)
  Subjective:    Patient ID: Rebekah Kennedy, female    DOB: 02/21/1956, 61 y.o.   MRN: [id number]  HPI The patient is here for follow up.      She is planning on having the gastric sleeve procedure this year - she is just waiting for approval.    Diabetes: she is following with Dr Ellison.  She is taking her medication daily as prescribed. She is mostly compliant with a diabetic diet. She is not exercising regularly. She checks her sugars occaisonally. She checks her feet daily and denies foot lesions. She is up-to-date with an ophthalmology examination.   Hypertension: She is taking her medication daily. She is compliant with a low sodium diet.  She denies chest pain, palpitations, regular shortness of breath and regular headaches. She is not exercising regularly.  She does not monitor her blood pressure at home.    Hyperlipidemia: She is taking her medication daily. She is compliant with a low fat/cholesterol diet. She is not exercising regularly.    H/o brain stem stroke:  She is taking plavix daily.  She is taking her statin daily.   Medications and allergies reviewed with patient and updated if appropriate.  Patient Active Problem List   Diagnosis Date Noted  . Greater trochanteric bursitis of both hips 12/19/2016  . Morbid obesity due to excess calories (HCC) 10/10/2016  . OSA (obstructive sleep apnea) 07/26/2016  . Pre-operative clearance 07/26/2016  . Muscle cramping 07/17/2016  . Hypersomnia 06/11/2016  . Diabetes (HCC) 07/19/2015  . Irritant dermatitis 01/29/2015  . History of colonic polyps 12/01/2014  . Rotator cuff injury 01/27/2014  . Vaginal atrophy 10/22/2013  . Morbidly obese (HCC) 10/05/2013  . Partial nontraumatic tear of right rotator cuff 09/21/2013  . Cervical dysplasia   . Goiter 12/10/2011  . Asthma 12/10/2011  . 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 TIA (transient ischemic attack) 02/13/2011  . Fibroid   . PROTEINURIA, MILD 09/15/2009  . PAIN  IN JOINT, UNSPECIFIED SITE 01/23/2007  . Hyperlipidemia 12/01/2006  . Essential hypertension 07/07/2006    Current Outpatient Prescriptions on File Prior to Visit  Medication Sig Dispense Refill  . albuterol (PROVENTIL HFA;VENTOLIN HFA) 108 (90 BASE) MCG/ACT inhaler Inhale 2 puffs into the lungs every 4 (four) hours as needed for wheezing or shortness of breath. 18 g 2  . amLODipine (NORVASC) 10 MG tablet TAKE ONE TABLET BY MOUTH ONE TIME DAILY 90 tablet 1  . BD ULTRA-FINE PEN NEEDLES 29G X 12.7MM MISC USE 3 TIMES DAILY AS DIRECTED 100 each 1  . blood glucose meter kit and supplies KIT One touch verio. Use up to four times daily as directed. (FOR ICD-9 250.00, 250.01). 1 each 0  . budesonide-formoterol (SYMBICORT) 160-4.5 MCG/ACT inhaler Inhale 2 puffs into the lungs 2 (two) times daily.    . clopidogrel (PLAVIX) 75 MG tablet Take 1 tablet (75 mg total) by mouth daily. 90 tablet 3  . Cod Liver Oil 1000 MG CAPS Take 1 capsule by mouth daily.    . Insulin Glargine (BASAGLAR KWIKPEN) 100 UNIT/ML SOPN Inject 4.6 mLs (460 Units total) into the skin every morning. And pen needles 4/day 55 pen 11  . Insulin Pen Needle 29G X 12MM MISC 1 Device by Does not apply route 2 (two) times daily. 100 each 11  . losartan (COZAAR) 100 MG tablet TAKE 1 TAB BY MOUTH ONCE DAILY. 90 tablet 1  . meloxicam (MOBIC) 15 MG tablet Take 1 tablet (  15 mg total) by mouth daily as needed for pain. 30 tablet 0  . metoprolol succinate (TOPROL-XL) 100 MG 24 hr tablet TAKE 1 TABLET BY MOUTH DAILY WITH OR IMMEDIATELY FOLLOWING A MEAL 90 tablet 2  . montelukast (SINGULAIR) 10 MG tablet TAKE 1 TABLET BY MOUTH AT BEDTIME 30 tablet 5  . Multiple Vitamin (MULTIVITAMIN) tablet Take 1 tablet by mouth daily.      . pravastatin (PRAVACHOL) 20 MG tablet Take 1 tablet (20 mg total) by mouth daily. 90 tablet 3  . spironolactone (ALDACTONE) 25 MG tablet Take 0.5 tablets (12.5 mg total) by mouth daily. 90 tablet 1  . triamcinolone cream  (KENALOG) 0.1 % Apply 1 application topically 4 (four) times daily. As needed for itching 45 g 11   No current facility-administered medications on file prior to visit.     Past Medical History:  Diagnosis Date  . ASTHMA NOS W/ACUTE EXACERBATION 07/10/2010  . Cataract   . Cervical dysplasia   . CHEST PAIN 01/23/2007  . DEPRESSION 03/16/2007  . DIABETES MELLITUS, TYPE II 12/01/2006  . Dysmenorrhea   . Fibroid   . HYPERLIPIDEMIA 12/01/2006  . HYPERTENSION 07/07/2006  . Obesity   . Splenomegaly    in college  . Stroke (HCC) 2012   R pontine, mild residual left hemiparesis  . TIA (transient ischemic attack) 2012    Past Surgical History:  Procedure Laterality Date  . 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 APPENDECTOMY    . BIOPSY THYROID  05/02/11   Nonneoplastic goiter  . BREAST BIOPSY    . BREAST SURGERY     Reduction  . COLPOSCOPY    . DILATION AND CURETTAGE OF UTERUS  1975   DUB  . EYE SURGERY    . GYNECOLOGIC CRYOSURGERY    . MYOMECTOMY    . OVARIAN CYST REMOVAL    . PELVIC LAPAROSCOPY  75,88   DL lysis of adhesions    Social History   Social History  . Marital status: Single    Spouse name: N/A  . Number of children: 2  . Years of education: N/A   Occupational History  . TEACHER [employer]   Social History Main Topics  . Smoking status: Never Smoker  . Smokeless tobacco: Never Used  . Alcohol use 0.0 oz/week     Comment: very rare  . Drug use: No  . Sexual activity: Not Currently    Birth control/ protection: Post-menopausal     Comment: 1st intercourse 61 yo-Fewer than 5 partners   Other Topics Concern  . 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 Cancer Maternal Grandmother        Colon Cancer  . Asthma Maternal Grandmother   . Diabetes Father   . Heart disease Father   . Hypertension  Father   . Hyperlipidemia Father   . Hypertension Mother   . Heart disease Mother   . Ovarian cancer Mother   . Cancer Mother        Lung cancer  . Asthma Mother   . COPD Mother   . Hyperlipidemia Mother   . Diabetes Brother   . Hypertension Brother   . Kidney disease Brother   . Asthma Brother   . Heart disease Brother   .   Hyperlipidemia Brother   . Graves' disease Sister   . Diabetes Sister   . Breast cancer Sister 55  . Graves' disease Paternal Grandmother   . Hypertension Paternal Grandmother   . Heart disease Paternal Grandmother   . Alzheimer's disease Paternal Grandmother   . Cancer Maternal Grandfather     Review of Systems  Constitutional: Negative for chills and fever.  Respiratory: Positive for shortness of breath (with high humidity). Negative for cough and wheezing.   Cardiovascular: Positive for leg swelling (mild at times). Negative for chest pain and palpitations.  Musculoskeletal: Positive for arthralgias (right hip and thigh).  Neurological: Positive for numbness (finger tips, none in toes). Negative for light-headedness and headaches.       Objective:   Vitals:   01/15/17 1559  BP: (!) 164/104  Pulse: 93  Resp: 16  Temp: 98.3 F (36.8 C)  SpO2: 97%   Wt Readings from Last 3 Encounters:  01/15/17 243 lb (110.2 kg)  12/19/16 242 lb (109.8 kg)  12/10/16 244 lb 14.4 oz (111.1 kg)   Body mass index is 40.44 kg/m.   Physical Exam    Constitutional: Appears well-developed and well-nourished. No distress.  HENT:  Head: Normocephalic and atraumatic.  Neck: Neck supple. No tracheal deviation present. No thyromegaly present.  No cervical lymphadenopathy Cardiovascular: Normal rate, regular rhythm and normal heart sounds.   No murmur heard. No carotid bruit .  No edema Pulmonary/Chest: Effort normal and breath sounds normal. No respiratory distress. No has no wheezes. No rales.  Skin: Skin is warm and dry. Not diaphoretic.  Psychiatric: Normal  mood and affect. Behavior is normal.      Assessment & Plan:    See Problem List for Assessment and Plan of chronic medical problems.   

## 2017-01-25 ENCOUNTER — Encounter (HOSPITAL_COMMUNITY): Payer: Self-pay | Admitting: Nurse Practitioner

## 2017-01-25 ENCOUNTER — Emergency Department (HOSPITAL_COMMUNITY)
Admission: EM | Admit: 2017-01-25 | Discharge: 2017-01-25 | Disposition: A | Discharge status: Home / Self Care | Payer: BC Managed Care – PPO | Attending: Emergency Medicine | Admitting: Emergency Medicine

## 2017-01-25 DIAGNOSIS — R61 Generalized hyperhidrosis: Secondary | ICD-10-CM | POA: Diagnosis not present

## 2017-01-25 DIAGNOSIS — E119 Type 2 diabetes mellitus without complications: Secondary | ICD-10-CM | POA: Insufficient documentation

## 2017-01-25 DIAGNOSIS — R42 Dizziness and giddiness: Secondary | ICD-10-CM | POA: Insufficient documentation

## 2017-01-25 DIAGNOSIS — I69354 Hemiplegia and hemiparesis following cerebral infarction affecting left non-dominant side: Secondary | ICD-10-CM | POA: Diagnosis not present

## 2017-01-25 DIAGNOSIS — I10 Essential (primary) hypertension: Secondary | ICD-10-CM | POA: Diagnosis not present

## 2017-01-25 DIAGNOSIS — Z7902 Long term (current) use of antithrombotics/antiplatelets: Secondary | ICD-10-CM | POA: Insufficient documentation

## 2017-01-25 DIAGNOSIS — H538 Other visual disturbances: Secondary | ICD-10-CM | POA: Diagnosis not present

## 2017-01-25 DIAGNOSIS — Z794 Long term (current) use of insulin: Secondary | ICD-10-CM | POA: Diagnosis not present

## 2017-01-25 DIAGNOSIS — Z79899 Other long term (current) drug therapy: Secondary | ICD-10-CM | POA: Insufficient documentation

## 2017-01-25 LAB — URINALYSIS, ROUTINE W REFLEX MICROSCOPIC
BACTERIA UA: NONE SEEN
Bilirubin Urine: NEGATIVE
Glucose, UA: 50 mg/dL — AB
Hgb urine dipstick: NEGATIVE
Ketones, ur: NEGATIVE mg/dL
Leukocytes, UA: NEGATIVE
Nitrite: NEGATIVE
PROTEIN: 100 mg/dL — AB
Specific Gravity, Urine: 1.013 (ref 1.005–1.030)
pH: 5 (ref 5.0–8.0)

## 2017-01-25 LAB — CBC WITH DIFFERENTIAL/PLATELET
Basophils Absolute: 0 10*3/uL (ref 0.0–0.1)
Basophils Relative: 0 %
Eosinophils Absolute: 0 10*3/uL (ref 0.0–0.7)
Eosinophils Relative: 0 %
HEMATOCRIT: 43.9 % (ref 36.0–46.0)
HEMOGLOBIN: 14.6 g/dL (ref 12.0–15.0)
LYMPHS ABS: 1.8 10*3/uL (ref 0.7–4.0)
Lymphocytes Relative: 15 %
MCH: 27.6 pg (ref 26.0–34.0)
MCHC: 33.3 g/dL (ref 30.0–36.0)
MCV: 83 fL (ref 78.0–100.0)
MONO ABS: 0.4 10*3/uL (ref 0.1–1.0)
MONOS PCT: 3 %
NEUTROS ABS: 9.8 10*3/uL — AB (ref 1.7–7.7)
NEUTROS PCT: 82 %
Platelets: 382 10*3/uL (ref 150–400)
RBC: 5.29 MIL/uL — ABNORMAL HIGH (ref 3.87–5.11)
RDW: 13.9 % (ref 11.5–15.5)
WBC: 12.1 10*3/uL — ABNORMAL HIGH (ref 4.0–10.5)

## 2017-01-25 LAB — COMPREHENSIVE METABOLIC PANEL
ALK PHOS: 67 U/L (ref 38–126)
ALT: 18 U/L (ref 14–54)
AST: 19 U/L (ref 15–41)
Albumin: 3.8 g/dL (ref 3.5–5.0)
Anion gap: 9 (ref 5–15)
BILIRUBIN TOTAL: 0.6 mg/dL (ref 0.3–1.2)
BUN: 19 mg/dL (ref 6–20)
CO2: 27 mmol/L (ref 22–32)
CREATININE: 0.88 mg/dL (ref 0.44–1.00)
Calcium: 9.4 mg/dL (ref 8.9–10.3)
Chloride: 102 mmol/L (ref 101–111)
GFR calc Af Amer: >60 mL/min (ref >60)
GFR calc non Af Amer: >60 mL/min (ref >60)
GLUCOSE: 213 mg/dL — AB (ref 65–99)
Potassium: 4.2 mmol/L (ref 3.5–5.1)
Sodium: 138 mmol/L (ref 135–145)
TOTAL PROTEIN: 7.7 g/dL (ref 6.5–8.1)

## 2017-01-25 LAB — CBG MONITORING, ED: Glucose-Capillary: 202 mg/dL — ABNORMAL HIGH (ref 65–99)

## 2017-01-25 NOTE — ED Provider Notes (Signed)
[facility] DEPT Provider Note   CSN: [id number] Arrival date & time: 01/25/17  0856     History   Chief Complaint Chief Complaint  Patient presents with  . Dizziness  . Diabetes    HPI Rebekah Kennedy is a 61 y.o. female.  The history is provided by the patient.  Dizziness  Quality:  Lightheadedness Severity:  Moderate Onset quality:  Sudden Duration: minutes. Timing:  Rare Progression:  Resolved Chronicity:  New Context comment:  Getting up from bed Relieved by: eating and sitting down. Worsened by:  Nothing Ineffective treatments:  None tried Associated symptoms: no blood in stool, no diarrhea, no nausea, no palpitations, no shortness of breath, no syncope, no vomiting and no weakness    61 year old female who presents with dizziness. 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 States that she is recently started on a [employer] program 3 days ago. Yesterday morning when she woke up she felt lightheaded, dizzy, and reports she felt like she could be hypoglycemic. She ate something and it felt better and symptoms went away. This morning when she woke up and got out of bed she had the same feeling of feeling dizzy or lightheaded, sweaty and clammy, with blurry vision, and feeling off balance. States that she ate a banana and some reason not since that down and her symptoms are now fully resolved. No chest pain or difficulty breathing nausea, vomiting, diarrhea, abdominal pain, melena or hematochezia, fevers, diplopia, speech changes, focal numbness or weakness.  Past Medical History:  Diagnosis Date  . ASTHMA NOS W/ACUTE EXACERBATION 07/10/2010  . Cataract   . Cervical dysplasia   . CHEST PAIN 01/23/2007  . DEPRESSION 03/16/2007  . DIABETES MELLITUS, TYPE II 12/01/2006  . Dysmenorrhea   . Fibroid   . HYPERLIPIDEMIA 12/01/2006  . HYPERTENSION 07/07/2006  . Obesity   . Splenomegaly    in  college  . Stroke [facility]) 2012   R pontine, mild residual left hemiparesis  . TIA (transient ischemic attack) 2012    Patient Active Problem List   Diagnosis Date Noted  . Greater trochanteric bursitis of both hips 12/19/2016  . Morbid obesity due to excess calories ([place]) 10/10/2016  . OSA (obstructive sleep apnea) 07/26/2016  . Pre-operative clearance 07/26/2016  . Muscle cramping 07/17/2016  . Hypersomnia 06/11/2016  . Diabetes ([place]) 07/19/2015  . Irritant dermatitis 01/29/2015  . History of colonic polyps 12/01/2014  . Rotator cuff injury 01/27/2014  . Vaginal atrophy 10/22/2013  . Morbidly obese ([place]) 10/05/2013  . Partial nontraumatic tear of right rotator cuff 09/21/2013  . Cervical dysplasia   . Goiter 12/10/2011  . Asthma 12/10/2011  . 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 TIA (transient ischemic attack) 02/13/2011  . Fibroid   . PROTEINURIA, MILD 09/15/2009  . PAIN IN JOINT, UNSPECIFIED SITE 01/23/2007  . Hyperlipidemia 12/01/2006  . Essential hypertension 07/07/2006    Past Surgical History:  Procedure Laterality Date  . 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 APPENDECTOMY    . BIOPSY THYROID  05/02/11   Nonneoplastic goiter  . BREAST BIOPSY    . BREAST SURGERY     Reduction  . COLPOSCOPY    . DILATION AND CURETTAGE OF UTERUS  1975   DUB  . EYE SURGERY    . GYNECOLOGIC CRYOSURGERY    . MYOMECTOMY    . OVARIAN CYST REMOVAL    . PELVIC LAPAROSCOPY  75,88   DL lysis of adhesions    OB History    Gravida Para Term Preterm AB Living   0             SAB TAB Ectopic Multiple Live Births                   Home Medications    Prior to Admission medications   Medication Sig Start Date End Date Taking? Authorizing Provider  albuterol (PROVENTIL HFA;VENTOLIN HFA) 108 (90 BASE) MCG/ACT inhaler Inhale 2 puffs into the lungs every 4 (four) hours as needed for wheezing or shortness of breath. 03/02/14  Yes Hendricks Limes, MD  amLODipine (NORVASC) 10 MG  tablet TAKE ONE TABLET BY MOUTH ONE TIME DAILY 10/10/16  Yes Binnie Rail, MD  clopidogrel (PLAVIX) 75 MG tablet Take 1 tablet (75 mg total) by mouth daily. 12/16/15  Yes Patel, Donika K, DO  [place] Liver Oil 1000 MG CAPS Take 1 capsule by mouth daily.   Yes [provider]  Insulin Glargine (BASAGLAR KWIKPEN) 100 UNIT/ML SOPN Inject 4.6 mLs (460 Units total) into the skin every morning. And pen needles 4/day 12/03/16  Yes Renato Shin, MD  losartan (COZAAR) 100 MG tablet TAKE 1 TAB BY MOUTH ONCE DAILY. 10/10/16  Yes Burns, Claudina Lick, MD  meloxicam (MOBIC) 15 MG tablet Take 1 tablet (15 mg total) by mouth daily as needed for pain. 12/19/16  Yes Rosemarie Ax, MD  metoprolol succinate (TOPROL-XL) 100 MG 24 hr tablet TAKE 1 TABLET BY MOUTH DAILY WITH OR IMMEDIATELY FOLLOWING A MEAL 04/20/16  Yes Burns, Claudina Lick, MD  montelukast (SINGULAIR) 10 MG tablet Take 1 tablet (10 mg total) by mouth at bedtime. 01/15/17  Yes Burns, Claudina Lick, MD  Multiple Vitamin (MULTIVITAMIN) tablet Take 1 tablet by mouth daily.     Yes [provider]  pravastatin (PRAVACHOL) 20 MG tablet Take 1 tablet (20 mg total) by mouth daily. 12/16/15  Yes Patel, Donika K, DO  spironolactone (ALDACTONE) 25 MG tablet Take 1 tablet (25 mg total) by mouth daily. 01/15/17  Yes Burns, Claudina Lick, MD  triamcinolone cream (KENALOG) 0.1 % Apply 1 application topically 4 (four) times daily. As needed for itching 03/03/15  Yes Renato Shin, MD  BD ULTRA-FINE PEN NEEDLES 29G X 12.7MM MISC USE 3 TIMES DAILY AS DIRECTED 10/01/16   Renato Shin, MD  blood glucose meter kit and supplies KIT One touch verio. Use up to four times daily as directed. (FOR ICD-9 250.00, 250.01). 01/10/16   Binnie Rail, MD  Insulin Pen Needle 29G X 12MM MISC 1 Device by Does not apply route 2 (two) times daily. 07/09/14   Renato Shin, MD    Family History Family History  Problem Relation Age of Onset  . Cancer Maternal Grandmother        Colon Cancer  . Asthma  Maternal Grandmother   . Diabetes Father   . Heart disease Father   . Hypertension Father   . Hyperlipidemia Father   . Hypertension Mother   . Heart disease Mother   . Ovarian cancer Mother   . Cancer Mother        Lung cancer  . Asthma Mother   . COPD Mother   . Hyperlipidemia Mother   . Diabetes Brother   . Hypertension Brother   . Kidney disease Brother   . Asthma Brother   . Heart disease Brother   . Hyperlipidemia Brother   .  Graves' disease Sister   . Diabetes Sister   . Breast cancer Sister 68  . Graves' disease Paternal Grandmother   . Hypertension Paternal Grandmother   . Heart disease Paternal Grandmother   . Alzheimer's disease Paternal Grandmother   . Cancer Maternal Grandfather     Social History Social History  Substance Use Topics  . Smoking status: Never Smoker  . Smokeless tobacco: Never Used  . Alcohol use 0.0 oz/week     Comment: very rare     Allergies   Quinapril   Review of Systems Review of Systems  Constitutional: Negative for fever.  Respiratory: Negative for shortness of breath.   Cardiovascular: Negative for palpitations and syncope.  Gastrointestinal: Negative for blood in stool, diarrhea, nausea and vomiting.  Neurological: Positive for dizziness. Negative for weakness.  All other systems reviewed and are negative.    Physical Exam Updated Vital Signs BP (!) 152/80   Pulse 80   Temp 97.6 F (36.4 C) (Oral)   Resp 16   Ht _0  (1.651 m)   Wt 108.9 kg (240 lb)   SpO2 99%   BMI 39.94 kg/m   Physical Exam Physical Exam  Nursing note and vitals reviewed. Constitutional: Well developed, well nourished, non-toxic, and in no acute distress Head: Normocephalic and atraumatic.  Mouth/Throat: Oropharynx is clear and moist.  Neck: Normal range of motion. Neck supple.  Cardiovascular: Normal rate and regular rhythm.   Pulmonary/Chest: Effort normal and breath sounds normal.  Abdominal: Soft. There is no tenderness. There is  no rebound and no guarding.  Musculoskeletal: Normal range of motion.  Skin: Skin is warm and dry.  Psychiatric: Cooperative Neurological:  Alert, oriented to person, place, time, and situation. Memory grossly in tact. Fluent speech. No dysarthria or aphasia.  Cranial nerves: VF are full. EOMI without nystagmus. No gaze deviation. Facial muscles symmetric with activation. Sensation to light touch over face in tact bilaterally. Hearing grossly in tact. Palate elevates symmetrically. Head turn and shoulder shrug are intact. Tongue midline.  Reflexes defered.  Muscle bulk and tone normal. No pronator drift. Full strength antigravity in all 4 extremities (but appears to use more effort to lift LLE against gravity) Sensation to light touch is in tact throughout in bilateral upper and lower extremities. Coordination reveals no dysmetria with finger to nose.    ED Treatments / Results  Labs (all labs ordered are listed, but only abnormal results are displayed) Labs Reviewed  CBC WITH DIFFERENTIAL/PLATELET - Abnormal; Notable for the following:       Result Value   WBC 12.1 (*)    RBC 5.29 (*)    Neutro Abs 9.8 (*)    All other components within normal limits  COMPREHENSIVE METABOLIC PANEL - Abnormal; Notable for the following:    Glucose, Bld 213 (*)    All other components within normal limits  URINALYSIS, ROUTINE W REFLEX MICROSCOPIC - Abnormal; Notable for the following:    Glucose, UA 50 (*)    Protein, ur 100 (*)    Squamous Epithelial / LPF 0-5 (*)    All other components within normal limits  CBG MONITORING, ED - Abnormal; Notable for the following:    Glucose-Capillary 202 (*)    All other components within normal limits    EKG  EKG Interpretation None       Radiology No results found.  Procedures Procedures (including critical care time)  Medications Ordered in ED Medications - No data to display  Initial Impression / Assessment and Plan / ED Course  I have  reviewed the triage vital signs and the nursing notes.  Pertinent labs & imaging results that were available during my care of the patient were reviewed by me and considered in my medical decision making (see chart for details).     Presents with episode of dizziness, now resolved. May be related to hypoglycemia, as resolved after eating. Mildly hyperglycemic today. No DKA evidence. EKG w/o stigmata of arrhythmia. Normal neurological exam and ambulating steadily. No concern for cerebellar process. Sounds more near syncopal, and in setting of recent diet changes may also play a role. Blood work otherwise reassuring. UA unremarkable. Plan to discharge with close outpatient follow-up.  Strict return and follow-up instructions reviewed. She expressed understanding of all discharge instructions and felt comfortable with the plan of care.   Final Clinical Impressions(s) / ED Diagnoses   Final diagnoses:  Dizziness    New Prescriptions New Prescriptions   No medications on file     Forde Dandy, MD 01/25/17 1208

## 2017-01-25 NOTE — ED Triage Notes (Signed)
Patient states she woke up this morning and felt like her blood sugar was low. She felt this way yesterday also and when she ate something sweet it seemed to improve. States this morning she was having some dizziness that wasn't resolving which brought her here. States her vision was blurry, feel off balance. Denies monitoring blood sugar so not sure if that could be causing it. States she recently started a weight program. Some numbness in her finger and toes during the episode.

## 2017-01-25 NOTE — ED Notes (Signed)
Patient was unable to get urine specimen. Will continue to try.

## 2017-01-25 NOTE — Discharge Instructions (Signed)
Your blood work is reassuring.  Please follow-up closely with your primary care doctor. Please return for worsening symptoms, including confusion, inability to walk, new numbness/weakness, passing out or any other symptoms concerning to you.

## 2017-01-25 NOTE — ED Notes (Signed)
Patient denies pain and is resting comfortably.  

## 2017-01-31 ENCOUNTER — Other Ambulatory Visit: Payer: Self-pay | Admitting: Neurology

## 2017-01-31 ENCOUNTER — Other Ambulatory Visit: Payer: Self-pay | Admitting: Internal Medicine

## 2017-01-31 DIAGNOSIS — I10 Essential (primary) hypertension: Secondary | ICD-10-CM

## 2017-01-31 DIAGNOSIS — R000 Tachycardia, unspecified: Secondary | ICD-10-CM

## 2017-02-11 ENCOUNTER — Encounter: Payer: Self-pay | Admitting: Endocrinology

## 2017-02-11 ENCOUNTER — Ambulatory Visit (INDEPENDENT_AMBULATORY_CARE_PROVIDER_SITE_OTHER): Payer: BC Managed Care – PPO | Admitting: Endocrinology

## 2017-02-11 VITALS — BP 132/84 | HR 92 | Wt 244.4 lb

## 2017-02-11 DIAGNOSIS — E1151 Type 2 diabetes mellitus with diabetic peripheral angiopathy without gangrene: Secondary | ICD-10-CM

## 2017-02-11 DIAGNOSIS — Z794 Long term (current) use of insulin: Secondary | ICD-10-CM | POA: Diagnosis not present

## 2017-02-11 LAB — POCT GLYCOSYLATED HEMOGLOBIN (HGB A1C): Hemoglobin A1C: 8.7

## 2017-02-11 NOTE — Progress Notes (Signed)
Subjective:    Patient ID: Rebekah Kennedy, female    DOB: [birth year], 61 y.o.   MRN: [id number]  HPI Pt returns for f/u of diabetes mellitus:  DM type: Insulin-requiring type 2.  Dx'ed: 2004.  Complications: CVA, nephropathy, polyneuropathy, and prolif retinopathy.  Therapy: insulin since 2013 GDM: never.  DKA: never Severe hypoglycemia: once, in 2018.  Pancreatitis: never.  Other: due to noncompliance with insulin injections and cbg monitoring, she is on a qd insulin regimen.   Interval history: Pt says she seldom misses the insulin.  gastric bypass has been delayed until November or Dec.  2 weeks ago, she had an episode of near-syncopal episode.  This happened at 4 AM.  No cause was found, but severe hypoglycemia was presumed.   Past Medical History:  Diagnosis Date  . ASTHMA NOS W/ACUTE EXACERBATION 07/10/2010  . Cataract   . Cervical dysplasia   . CHEST PAIN 01/23/2007  . DEPRESSION 03/16/2007  . DIABETES MELLITUS, TYPE II 12/01/2006  . Dysmenorrhea   . Fibroid   . HYPERLIPIDEMIA 12/01/2006  . HYPERTENSION 07/07/2006  . Obesity   . Splenomegaly    in college  . Stroke [facility]) 2012   R pontine, mild residual left hemiparesis  . TIA (transient ischemic attack) 2012    Past Surgical History:  Procedure Laterality Date  . 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 APPENDECTOMY    . BIOPSY THYROID  05/02/11   Nonneoplastic goiter  . BREAST BIOPSY    . BREAST SURGERY     Reduction  . COLPOSCOPY    . DILATION AND CURETTAGE OF UTERUS  1975   DUB  . EYE SURGERY    . GYNECOLOGIC CRYOSURGERY    . MYOMECTOMY    . OVARIAN CYST REMOVAL    . PELVIC LAPAROSCOPY  75,88   DL lysis of adhesions    Social History   Social History  . Marital status: Single    Spouse name: N/A  . Number of children: 2  . Years of education: N/A   Occupational History  . [place]   Social History Main Topics  . Smoking status: Never Smoker  . Smokeless tobacco: Never Used    . Alcohol use 0.0 oz/week     Comment: very rare  . Drug use: No  . Sexual activity: Not Currently    Birth control/ protection: Post-menopausal     Comment: 1st intercourse 61 yo-Fewer than 5 partners   Other Topics Concern  . 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 albuterol (PROVENTIL HFA;VENTOLIN HFA) 108 (90 BASE) MCG/ACT inhaler Inhale 2 puffs into the lungs every 4 (four) hours as needed for wheezing or shortness of breath. 18 g 2  . amLODipine (NORVASC) 10 MG tablet TAKE ONE TABLET BY MOUTH ONE TIME DAILY 90 tablet 1  . BD ULTRA-FINE PEN NEEDLES 29G X 12.7MM MISC USE 3 TIMES DAILY AS DIRECTED 100 each 1  . blood glucose meter kit and supplies KIT One touch verio. Use up to four times daily as directed. (FOR ICD-9 250.00, 250.01). 1 each 0  . clopidogrel (PLAVIX) 75 MG tablet Take 1 tablet (75 mg total) by mouth daily. 90 tablet 3  . [place] Liver  Oil 1000 MG CAPS Take 1 capsule by mouth daily.    . Insulin Glargine (BASAGLAR KWIKPEN) 100 UNIT/ML SOPN Inject 4.6 mLs (460 Units total) into the skin every morning. And pen needles 4/day 55 pen 11  . losartan (COZAAR) 100 MG tablet TAKE 1 TAB BY MOUTH ONCE DAILY. 90 tablet 1  . meloxicam (MOBIC) 15 MG tablet Take 1 tablet (15 mg total) by mouth daily as needed for pain. 30 tablet 0  . metoprolol succinate (TOPROL-XL) 100 MG 24 hr tablet TAKE 1 TABLET BY MOUTH DAILY WITH OR IMMEDIATELY FOLLOWING A MEAL 90 tablet 1  . montelukast (SINGULAIR) 10 MG tablet Take 1 tablet (10 mg total) by mouth at bedtime. 90 tablet 3  . Multiple Vitamin (MULTIVITAMIN) tablet Take 1 tablet by mouth daily.      . pravastatin (PRAVACHOL) 20 MG tablet Take 1 tablet (20 mg total) by mouth daily. 90 tablet 3  . spironolactone (ALDACTONE) 25 MG tablet Take 1 tablet (25 mg total)  by mouth daily. 90 tablet 1  . triamcinolone cream (KENALOG) 0.1 % Apply 1 application topically 4 (four) times daily. As needed for itching 45 g 11   No current facility-administered medications on file prior to visit.     Allergies  Allergen Reactions  . Quinapril     cough    Family History  Problem Relation Age of Onset  . Cancer Maternal Grandmother        Colon Cancer  . Asthma Maternal Grandmother   . Diabetes Father   . Heart disease Father   . Hypertension Father   . Hyperlipidemia Father   . Hypertension Mother   . Heart disease Mother   . Ovarian cancer Mother   . Cancer Mother        Lung cancer  . Asthma Mother   . COPD Mother   . Hyperlipidemia Mother   . Diabetes Brother   . Hypertension Brother   . Kidney disease Brother   . Asthma Brother   . Heart disease Brother   . Hyperlipidemia Brother   . Graves' disease Sister   . Diabetes Sister   . Breast cancer Sister 52  . Graves' disease Paternal Grandmother   . Hypertension Paternal Grandmother   . Heart disease Paternal Grandmother   . Alzheimer's disease Paternal Grandmother   . Cancer Maternal Grandfather     BP 132/84   Pulse 92   Wt 244 lb 6.4 oz (110.9 kg)   SpO2 96%   BMI 40.67 kg/m    Review of Systems No weight change    Objective:   Physical Exam VITAL SIGNS:  See vs page GENERAL: no distress Pulses: foot pulses are intact bilaterally.   MSK: no deformity of the feet or ankles.  CV: trace bilat edema of the legs and ankles.  Skin:  no ulcer on the feet or ankles.  normal color and temp on the feet and ankles Neuro: sensation is intact to touch on the feet and ankles, but decreased from normal.    Lab Results  Component Value Date   HGBA1C 8.7 02/11/2017      Assessment & Plan:  Insulin-requiring type 2 DM, with PDR.  Severe hypoglycemia, new: in this setting, we can't increase insulin now.  Obesity: this also compromises the rx of DM.  She is advised to continue to  pursue surgery.   Patient Instructions  Please continue the same insulin for now.  On this type of insulin schedule,  you should eat meals on a regular schedule.  If a meal is missed or significantly delayed, your blood sugar could go low.  Also, you should eat a light snack at bedtime, to avoid it going low in the middle of the night.  Continue working toward the weight loss surgery.  This is the best treatment for the diabetes.  check your blood sugar twice a day.  vary the time of day when you check, between before the 3 meals, and at bedtime.  also check if you have symptoms of your blood sugar being too high or too low.  please keep a record of the readings and bring it to your next appointment here.  please call us sooner if you are having low blood sugar episodes, or if it stays over 200.   Please come back for a follow-up appointment in 2 months.

## 2017-02-11 NOTE — Patient Instructions (Addendum)
Please continue the same insulin for now.  On this type of insulin schedule, you should eat meals on a regular schedule.  If a meal is missed or significantly delayed, your blood sugar could go low.  Also, you should eat a light snack at bedtime, to avoid it going low in the middle of the night.  Continue working toward the weight loss surgery.  This is the best treatment for the diabetes.  check your blood sugar twice a day.  vary the time of day when you check, between before the 3 meals, and at bedtime.  also check if you have symptoms of your blood sugar being too high or too low.  please keep a record of the readings and bring it to your next appointment here.  please call us sooner if you are having low blood sugar episodes, or if it stays over 200.   Please come back for a follow-up appointment in 2 months.

## 2017-02-21 LAB — HM DIABETES EYE EXAM

## 2017-03-08 ENCOUNTER — Encounter: Payer: Self-pay | Admitting: Endocrinology

## 2017-04-10 ENCOUNTER — Encounter: Payer: Self-pay | Admitting: Gynecology

## 2017-04-10 ENCOUNTER — Ambulatory Visit: Payer: BC Managed Care – PPO | Admitting: Gynecology

## 2017-04-10 VITALS — BP 122/80 | Ht 67.0 in | Wt 252.0 lb

## 2017-04-10 DIAGNOSIS — N952 Postmenopausal atrophic vaginitis: Secondary | ICD-10-CM | POA: Diagnosis not present

## 2017-04-10 DIAGNOSIS — Z01411 Encounter for gynecological examination (general) (routine) with abnormal findings: Secondary | ICD-10-CM

## 2017-04-10 NOTE — Progress Notes (Signed)
    Rebekah Kennedy [birth year] [id number]        61 y.o.  G0P0 for annual gynecologic exam.  Doing well without complaints  Past medical history,surgical history, problem list, medications, allergies, family history and social history were all reviewed and documented as reviewed in the EPIC chart.  ROS:  Performed with pertinent positives and negatives included in the history, assessment and plan.   Additional significant findings : None   Exam: [name] assistant Vitals:   04/10/17 0910  BP: 122/80  Weight: 252 lb (114.3 kg)  Height: 5\' 7"  (1.702 m)   Body mass index is 39.47 kg/m.  General appearance:  Normal affect, orientation and appearance. Skin: Grossly normal HEENT: Without gross lesions.  No cervical or supraclavicular adenopathy. Thyroid normal.  Lungs:  Clear without wheezing, rales or rhonchi Cardiac: RR, without RMG Abdominal:  Soft, nontender, without masses, guarding, rebound, organomegaly or hernia Breasts:  Examined lying and sitting without masses, retractions, discharge or axillary adenopathy. Pelvic:  Ext, BUS, Vagina: Normal with atrophic changes  Cervix: Normal with atrophic changes  Uterus: Difficult to palpate but no gross masses or tenderness  Adnexa: Without gross masses or tenderness    Anus and perineum: Normal   Rectovaginal: Normal sphincter tone without palpated masses or tenderness.    Assessment/Plan:  61 y.o. G0P0 female for annual gynecologic exam.   1. Postmenopausal/atrophic genital changes.  No significant hot flushes, night sweats, vaginal dryness or any bleeding.  Continue to monitor and report any issues or bleeding. 2. Mammography 09/2016.  Has a six-month follow-up due now and she is going to arrange.  Breast exam normal today.  SBE monthly reviewed. 3. Colonoscopy due next year and she will make arrangements for this. 4. Pap smear/HPV 2015.  No Pap smear done today.  History of cervical dysplasia with cryosurgery in 1990 with  normal Pap smears since.  Plan repeat Pap smear at 5-year interval per current screening guidelines. 5. DEXA 2011 normal.  Schedule DEXA now at age 85.  Patient will schedule in follow-up for this. 6. Health maintenance.  No routine lab work done as patient does this elsewhere.  Follow-up in 1 year, sooner as needed.   Anastasio Auerbach MD, 9:51 AM 04/10/2017

## 2017-04-10 NOTE — Patient Instructions (Signed)
Follow-up in 1 year for annual exam, sooner if any issues. 

## 2017-04-16 ENCOUNTER — Ambulatory Visit: Payer: BC Managed Care – PPO | Admitting: Endocrinology

## 2017-04-25 ENCOUNTER — Ambulatory Visit: Payer: Self-pay | Admitting: General Surgery

## 2017-05-03 ENCOUNTER — Inpatient Hospital Stay (HOSPITAL_COMMUNITY): Admission: RE | Admit: 2017-05-03 | Payer: BC Managed Care – PPO | Source: Ambulatory Visit

## 2017-05-06 ENCOUNTER — Inpatient Hospital Stay: Admit: 2017-05-06 | Payer: BC Managed Care – PPO | Admitting: General Surgery

## 2017-05-06 SURGERY — LAPAROSCOPIC ROUX-EN-Y GASTRIC BYPASS WITH UPPER ENDOSCOPY
Anesthesia: General

## 2017-05-07 ENCOUNTER — Other Ambulatory Visit: Payer: Self-pay | Admitting: Internal Medicine

## 2017-05-20 ENCOUNTER — Encounter: Payer: Self-pay | Admitting: Internal Medicine

## 2017-05-21 DIAGNOSIS — M8580 Other specified disorders of bone density and structure, unspecified site: Secondary | ICD-10-CM

## 2017-05-21 HISTORY — DX: Other specified disorders of bone density and structure, unspecified site: M85.80

## 2017-05-22 ENCOUNTER — Ambulatory Visit: Payer: BC Managed Care – PPO

## 2017-05-23 ENCOUNTER — Ambulatory Visit (HOSPITAL_COMMUNITY)
Admit: 2017-05-23 | Discharge: 2017-05-23 | Disposition: A | Discharge status: Home / Self Care | Payer: BC Managed Care – PPO | Attending: Internal Medicine | Admitting: Internal Medicine

## 2017-05-23 ENCOUNTER — Encounter (HOSPITAL_COMMUNITY): Payer: Self-pay | Admitting: Nurse Practitioner

## 2017-05-23 ENCOUNTER — Other Ambulatory Visit: Payer: Self-pay

## 2017-05-23 ENCOUNTER — Inpatient Hospital Stay (HOSPITAL_COMMUNITY)
Admission: EM | Admit: 2017-05-23 | Discharge: 2017-05-25 | DRG: 305 | Disposition: A | Discharge status: Home / Self Care | Payer: BC Managed Care – PPO | Attending: Internal Medicine | Admitting: Internal Medicine

## 2017-05-23 ENCOUNTER — Emergency Department (HOSPITAL_COMMUNITY): Payer: BC Managed Care – PPO

## 2017-05-23 DIAGNOSIS — I160 Hypertensive urgency: Principal | ICD-10-CM | POA: Diagnosis present

## 2017-05-23 DIAGNOSIS — Z8249 Family history of ischemic heart disease and other diseases of the circulatory system: Secondary | ICD-10-CM

## 2017-05-23 DIAGNOSIS — R748 Abnormal levels of other serum enzymes: Secondary | ICD-10-CM | POA: Diagnosis not present

## 2017-05-23 DIAGNOSIS — J45909 Unspecified asthma, uncomplicated: Secondary | ICD-10-CM | POA: Diagnosis present

## 2017-05-23 DIAGNOSIS — Z8349 Family history of other endocrine, nutritional and metabolic diseases: Secondary | ICD-10-CM | POA: Diagnosis not present

## 2017-05-23 DIAGNOSIS — Z791 Long term (current) use of non-steroidal anti-inflammatories (NSAID): Secondary | ICD-10-CM

## 2017-05-23 DIAGNOSIS — Z7902 Long term (current) use of antithrombotics/antiplatelets: Secondary | ICD-10-CM | POA: Diagnosis not present

## 2017-05-23 DIAGNOSIS — R42 Dizziness and giddiness: Secondary | ICD-10-CM

## 2017-05-23 DIAGNOSIS — Z8041 Family history of malignant neoplasm of ovary: Secondary | ICD-10-CM | POA: Diagnosis not present

## 2017-05-23 DIAGNOSIS — E119 Type 2 diabetes mellitus without complications: Secondary | ICD-10-CM | POA: Diagnosis present

## 2017-05-23 DIAGNOSIS — Z6841 Body Mass Index (BMI) 40.0 and over, adult: Secondary | ICD-10-CM

## 2017-05-23 DIAGNOSIS — E785 Hyperlipidemia, unspecified: Secondary | ICD-10-CM | POA: Diagnosis present

## 2017-05-23 DIAGNOSIS — G4733 Obstructive sleep apnea (adult) (pediatric): Secondary | ICD-10-CM | POA: Diagnosis present

## 2017-05-23 DIAGNOSIS — I69954 Hemiplegia and hemiparesis following unspecified cerebrovascular disease affecting left non-dominant side: Secondary | ICD-10-CM | POA: Diagnosis not present

## 2017-05-23 DIAGNOSIS — Z841 Family history of disorders of kidney and ureter: Secondary | ICD-10-CM

## 2017-05-23 DIAGNOSIS — Z803 Family history of malignant neoplasm of breast: Secondary | ICD-10-CM | POA: Diagnosis not present

## 2017-05-23 DIAGNOSIS — I10 Essential (primary) hypertension: Secondary | ICD-10-CM | POA: Diagnosis present

## 2017-05-23 DIAGNOSIS — Z86010 Personal history of colonic polyps: Secondary | ICD-10-CM | POA: Diagnosis not present

## 2017-05-23 DIAGNOSIS — Z6839 Body mass index (BMI) 39.0-39.9, adult: Secondary | ICD-10-CM

## 2017-05-23 DIAGNOSIS — E049 Nontoxic goiter, unspecified: Secondary | ICD-10-CM | POA: Diagnosis present

## 2017-05-23 DIAGNOSIS — Z794 Long term (current) use of insulin: Secondary | ICD-10-CM

## 2017-05-23 DIAGNOSIS — R778 Other specified abnormalities of plasma proteins: Secondary | ICD-10-CM

## 2017-05-23 DIAGNOSIS — Z888 Allergy status to other drugs, medicaments and biological substances status: Secondary | ICD-10-CM

## 2017-05-23 DIAGNOSIS — Z820 Family history of epilepsy and other diseases of the nervous system: Secondary | ICD-10-CM | POA: Diagnosis not present

## 2017-05-23 DIAGNOSIS — Z833 Family history of diabetes mellitus: Secondary | ICD-10-CM

## 2017-05-23 DIAGNOSIS — R079 Chest pain, unspecified: Secondary | ICD-10-CM | POA: Diagnosis not present

## 2017-05-23 DIAGNOSIS — Z825 Family history of asthma and other chronic lower respiratory diseases: Secondary | ICD-10-CM

## 2017-05-23 DIAGNOSIS — R7989 Other specified abnormal findings of blood chemistry: Secondary | ICD-10-CM

## 2017-05-23 DIAGNOSIS — R270 Ataxia, unspecified: Secondary | ICD-10-CM

## 2017-05-23 DIAGNOSIS — R000 Tachycardia, unspecified: Secondary | ICD-10-CM

## 2017-05-23 LAB — COMPREHENSIVE METABOLIC PANEL
ALBUMIN: 3.7 g/dL (ref 3.5–5.0)
ALT: 17 U/L (ref 14–54)
ANION GAP: 8 (ref 5–15)
AST: 24 U/L (ref 15–41)
Alkaline Phosphatase: 76 U/L (ref 38–126)
BUN: 15 mg/dL (ref 6–20)
CO2: 29 mmol/L (ref 22–32)
Calcium: 9.5 mg/dL (ref 8.9–10.3)
Chloride: 101 mmol/L (ref 101–111)
Creatinine, Ser: 0.76 mg/dL (ref 0.44–1.00)
GFR calc Af Amer: >60 mL/min (ref >60)
GFR calc non Af Amer: >60 mL/min (ref >60)
GLUCOSE: 252 mg/dL — AB (ref 65–99)
POTASSIUM: 4.1 mmol/L (ref 3.5–5.1)
SODIUM: 138 mmol/L (ref 135–145)
Total Bilirubin: 0.8 mg/dL (ref 0.3–1.2)
Total Protein: 7.7 g/dL (ref 6.5–8.1)

## 2017-05-23 LAB — CBC WITH DIFFERENTIAL/PLATELET
Basophils Absolute: 0.1 10*3/uL (ref 0.0–0.1)
Basophils Relative: 0 %
Eosinophils Absolute: 0.3 10*3/uL (ref 0.0–0.7)
Eosinophils Relative: 3 %
HEMATOCRIT: 43.7 % (ref 36.0–46.0)
HEMOGLOBIN: 14.4 g/dL (ref 12.0–15.0)
LYMPHS ABS: 3.1 10*3/uL (ref 0.7–4.0)
LYMPHS PCT: 27 %
MCH: 27.7 pg (ref 26.0–34.0)
MCHC: 33 g/dL (ref 30.0–36.0)
MCV: 84 fL (ref 78.0–100.0)
MONOS PCT: 7 %
Monocytes Absolute: 0.7 10*3/uL (ref 0.1–1.0)
NEUTROS ABS: 7.3 10*3/uL (ref 1.7–7.7)
Neutrophils Relative %: 63 %
Platelets: 341 10*3/uL (ref 150–400)
RBC: 5.2 MIL/uL — ABNORMAL HIGH (ref 3.87–5.11)
RDW: 13.5 % (ref 11.5–15.5)
WBC: 11.4 10*3/uL — ABNORMAL HIGH (ref 4.0–10.5)

## 2017-05-23 LAB — TROPONIN I
TROPONIN I: 0.05 ng/mL — AB (ref <0.03)
Troponin I: 0.03 ng/mL (ref <0.03)
Troponin I: 0.05 ng/mL (ref <0.03)

## 2017-05-23 LAB — HEMOGLOBIN A1C
Hgb A1c MFr Bld: 8.3 % — ABNORMAL HIGH (ref 4.8–5.6)
Mean Plasma Glucose: 191.51 mg/dL

## 2017-05-23 LAB — GLUCOSE, CAPILLARY
GLUCOSE-CAPILLARY: 291 mg/dL — AB (ref 65–99)
Glucose-Capillary: 179 mg/dL — ABNORMAL HIGH (ref 65–99)

## 2017-05-23 LAB — TSH: TSH: 1.27 u[IU]/mL (ref 0.350–4.500)

## 2017-05-23 LAB — CBG MONITORING, ED: GLUCOSE-CAPILLARY: 278 mg/dL — AB (ref 65–99)

## 2017-05-23 MED ORDER — POLYVINYL ALCOHOL 1.4 % OP SOLN
2.0000 [drp] | OPHTHALMIC | Status: DC | PRN
  Filled 2017-05-23: qty 15

## 2017-05-23 MED ORDER — ONDANSETRON HCL 4 MG PO TABS: 4.0000 mg | ORAL_TABLET | Freq: Four times a day (QID) | ORAL | Status: DC | PRN

## 2017-05-23 MED ORDER — AMLODIPINE BESYLATE 10 MG PO TABS: 10.0000 mg | ORAL_TABLET | Freq: Every day | ORAL | Status: DC

## 2017-05-23 MED ORDER — LOSARTAN POTASSIUM 50 MG PO TABS: 100.0000 mg | ORAL_TABLET | Freq: Every day | ORAL | Status: DC

## 2017-05-23 MED ORDER — MONTELUKAST SODIUM 10 MG PO TABS
10.0000 mg | ORAL_TABLET | Freq: Every day | ORAL | Status: DC
  Administered 2017-05-23 – 2017-05-24 (×2): 10 mg via ORAL
  Filled 2017-05-23 (×2): qty 1

## 2017-05-23 MED ORDER — METOPROLOL SUCCINATE ER 100 MG PO TB24: 100.0000 mg | ORAL_TABLET | Freq: Every day | ORAL | Status: DC

## 2017-05-23 MED ORDER — INSULIN ASPART 100 UNIT/ML ~~LOC~~ SOLN
0.0000 [IU] | Freq: Every day | SUBCUTANEOUS | Status: DC
  Administered 2017-05-23: 3 [IU] via SUBCUTANEOUS
  Administered 2017-05-24: 2 [IU] via SUBCUTANEOUS

## 2017-05-23 MED ORDER — ADULT MULTIVITAMIN W/MINERALS CH
1.0000 | ORAL_TABLET | Freq: Every day | ORAL | Status: DC
  Filled 2017-05-23: qty 1

## 2017-05-23 MED ORDER — METOPROLOL SUCCINATE ER 100 MG PO TB24
100.0000 mg | ORAL_TABLET | Freq: Every day | ORAL | Status: DC
  Administered 2017-05-23 – 2017-05-25 (×3): 100 mg via ORAL
  Filled 2017-05-23 (×3): qty 1

## 2017-05-23 MED ORDER — ONDANSETRON HCL 4 MG/2ML IJ SOLN: 4.0000 mg | Freq: Four times a day (QID) | INTRAMUSCULAR | Status: DC | PRN

## 2017-05-23 MED ORDER — CARBOXYMETHYLCELLUL-GLYCERIN 0.5-0.9 % OP SOLN: 2.0000 [drp] | OPHTHALMIC | Status: DC | PRN

## 2017-05-23 MED ORDER — LOSARTAN POTASSIUM 50 MG PO TABS
100.0000 mg | ORAL_TABLET | Freq: Every day | ORAL | Status: DC
  Administered 2017-05-24 – 2017-05-25 (×2): 100 mg via ORAL
  Filled 2017-05-23 (×2): qty 2

## 2017-05-23 MED ORDER — CLOPIDOGREL BISULFATE 75 MG PO TABS
75.0000 mg | ORAL_TABLET | Freq: Every day | ORAL | Status: DC
  Administered 2017-05-23 – 2017-05-25 (×3): 75 mg via ORAL
  Filled 2017-05-23 (×3): qty 1

## 2017-05-23 MED ORDER — LOSARTAN POTASSIUM 50 MG PO TABS
100.0000 mg | ORAL_TABLET | Freq: Once | ORAL | Status: AC
  Administered 2017-05-23: 100 mg via ORAL
  Filled 2017-05-23: qty 2

## 2017-05-23 MED ORDER — AMLODIPINE BESYLATE 5 MG PO TABS
10.0000 mg | ORAL_TABLET | Freq: Once | ORAL | Status: AC
  Administered 2017-05-23: 10 mg via ORAL
  Filled 2017-05-23: qty 2

## 2017-05-23 MED ORDER — ALBUTEROL SULFATE (2.5 MG/3ML) 0.083% IN NEBU: 3.0000 mL | INHALATION_SOLUTION | RESPIRATORY_TRACT | Status: DC | PRN

## 2017-05-23 MED ORDER — HYDRALAZINE HCL 20 MG/ML IJ SOLN
5.0000 mg | INTRAMUSCULAR | Status: DC | PRN
  Administered 2017-05-24: 5 mg via INTRAVENOUS
  Filled 2017-05-23: qty 1

## 2017-05-23 MED ORDER — ENOXAPARIN SODIUM 40 MG/0.4ML ~~LOC~~ SOLN
40.0000 mg | SUBCUTANEOUS | Status: DC
  Administered 2017-05-24 – 2017-05-25 (×2): 40 mg via SUBCUTANEOUS
  Filled 2017-05-23 (×3): qty 0.4

## 2017-05-23 MED ORDER — AMLODIPINE BESYLATE 10 MG PO TABS
10.0000 mg | ORAL_TABLET | Freq: Every day | ORAL | Status: DC
  Administered 2017-05-24 – 2017-05-25 (×2): 10 mg via ORAL
  Filled 2017-05-23 (×2): qty 1

## 2017-05-23 MED ORDER — INSULIN ASPART 100 UNIT/ML ~~LOC~~ SOLN
0.0000 [IU] | Freq: Three times a day (TID) | SUBCUTANEOUS | Status: DC
Start: 2017-05-23 — End: 2017-05-24
  Administered 2017-05-23 – 2017-05-24 (×2): 2 [IU] via SUBCUTANEOUS

## 2017-05-23 MED ORDER — SPIRONOLACTONE 25 MG PO TABS
25.0000 mg | ORAL_TABLET | Freq: Every day | ORAL | Status: DC
  Administered 2017-05-23 – 2017-05-25 (×3): 25 mg via ORAL
  Filled 2017-05-23 (×3): qty 1

## 2017-05-23 NOTE — Progress Notes (Signed)
[facility] here to transport patient to [facility].

## 2017-05-23 NOTE — Progress Notes (Signed)
OK to hold Lovenox until MRI results back per Alger Memos, NP.

## 2017-05-23 NOTE — ED Provider Notes (Signed)
[facility] DEPT Provider Note   CSN: [id number] Arrival date & time: 05/23/17  2585     History   Chief Complaint Chief Complaint  Patient presents with  . Dizziness    HPI Rebekah Kennedy is a 62 y.o. female.  HPI Patient presents after feeling dizzy while she was driving to work.  States she felt as if the room was spinning.  States it also sounds like things are further away.  No headache.  No confusion.  Patient states it feels like when he had an ear infection.  No localizing numbness or weakness.  Mild nausea.  No chest pain.  Did not take her blood pressure medicines this morning.  States she has had TIAs before but those did not involve dizziness.  No vision changes. Past Medical History:  Diagnosis Date  . ASTHMA NOS W/ACUTE EXACERBATION 07/10/2010  . Cataract   . Cervical dysplasia   . CHEST PAIN 01/23/2007  . DEPRESSION 03/16/2007  . DIABETES MELLITUS, TYPE II 12/01/2006  . Dysmenorrhea   . Fibroid   . HYPERLIPIDEMIA 12/01/2006  . HYPERTENSION 07/07/2006  . Obesity   . Splenomegaly    in college  . 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 TIA (transient ischemic attack) 2012    Patient Active Problem List   Diagnosis Date Noted  . Greater trochanteric bursitis of both hips 12/19/2016  . OSA (obstructive sleep apnea) 07/26/2016  . Hypersomnia 06/11/2016  . Diabetes ([place]) 07/19/2015  . Irritant dermatitis 01/29/2015  . History of colonic polyps 12/01/2014  . Morbidly obese ([place]) 10/05/2013  . Partial nontraumatic tear of right rotator cuff 09/21/2013  . Goiter 12/10/2011  . Asthma 12/10/2011  . 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 TIA (transient ischemic attack) 02/13/2011  . PROTEINURIA, MILD 09/15/2009  . Hyperlipidemia 12/01/2006  . Essential hypertension 07/07/2006    Past Surgical History:  Procedure Laterality Date  . 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 APPENDECTOMY    . BIOPSY THYROID   05/02/11   Nonneoplastic goiter  . BREAST BIOPSY    . BREAST SURGERY     Reduction  . COLPOSCOPY    . DILATION AND CURETTAGE OF UTERUS  1975   DUB  . EYE SURGERY     Laser  . GYNECOLOGIC CRYOSURGERY    . MYOMECTOMY    . OVARIAN CYST REMOVAL    . PELVIC LAPAROSCOPY  75,88   DL lysis of adhesions    OB History    Gravida Para Term Preterm AB Living   0             SAB TAB Ectopic Multiple Live Births                   Home Medications    Prior to Admission medications   Medication Sig Start Date End Date Taking? Authorizing Provider  albuterol (PROVENTIL HFA;VENTOLIN HFA) 108 (90 BASE) MCG/ACT inhaler Inhale 2 puffs into the lungs every 4 (four) hours as needed for wheezing or shortness of breath. 03/02/14  Yes Hendricks Limes, MD  amLODipine (NORVASC) 10 MG tablet TAKE ONE TABLET BY MOUTH ONE TIME DAILY 10/10/16  Yes Burns, Claudina Lick, MD  Carboxymethylcellul-Glycerin (LUBRICATING EYE DROPS OP) Place 1 drop into both eyes daily as needed (dry eyes).   Yes [provider]  clopidogrel (PLAVIX) 75 MG tablet Take 1 tablet (75 mg total) by mouth daily. 12/16/15  Yes Patel, Donika K, DO  Insulin Glargine (BASAGLAR KWIKPEN) 100 UNIT/ML SOPN Inject 4.6 mLs (460 Units total) into the skin every morning. And pen needles 4/day Patient taking differently: Inject 150 Units into the skin every morning. And pen needles 4/day 12/03/16  Yes Renato Shin, MD  losartan (COZAAR) 100 MG tablet TAKE 1 TAB BY MOUTH ONCE DAILY. 10/10/16  Yes Burns, Claudina Lick, MD  metoprolol succinate (TOPROL-XL) 100 MG 24 hr tablet TAKE 1 TABLET BY MOUTH DAILY WITH OR IMMEDIATELY FOLLOWING A MEAL 01/31/17  Yes Burns, Claudina Lick, MD  montelukast (SINGULAIR) 10 MG tablet Take 1 tablet (10 mg total) by mouth at bedtime. Patient taking differently: Take 10 mg by mouth daily.  01/15/17  Yes Burns, Claudina Lick, MD  Multiple Vitamin (MULTIVITAMIN) tablet Take 1 tablet by mouth daily.     Yes [provider]    spironolactone (ALDACTONE) 25 MG tablet Take 1 tablet (25 mg total) by mouth daily. 01/15/17  Yes Binnie Rail, MD  BD ULTRA-FINE PEN NEEDLES 29G X 12.7MM MISC USE 3 TIMES DAILY AS DIRECTED 10/01/16   Renato Shin, MD  blood glucose meter kit and supplies KIT One touch verio. Use up to four times daily as directed. (FOR ICD-9 250.00, 250.01). 01/10/16   Binnie Rail, MD  meloxicam (MOBIC) 15 MG tablet Take 1 tablet (15 mg total) by mouth daily as needed for pain. Patient not taking: Reported on 04/30/2017 12/19/16   Rosemarie Ax, MD  montelukast (SINGULAIR) 10 MG tablet TAKE 1 TABLET BY MOUTH AT BEDTIME Patient not taking: Reported on 05/23/2017 05/07/17   Binnie Rail, MD  pravastatin (PRAVACHOL) 20 MG tablet Take 1 tablet (20 mg total) by mouth daily. Patient not taking: Reported on 04/30/2017 12/16/15   Narda Amber K, DO  triamcinolone cream (KENALOG) 0.1 % Apply 1 application topically 4 (four) times daily. As needed for itching Patient not taking: Reported on 04/30/2017 03/03/15   Renato Shin, MD    Family History Family History  Problem Relation Age of Onset  . Cancer Maternal Grandmother        Colon Cancer  . Asthma Maternal Grandmother   . Diabetes Father   . Heart disease Father   . Hypertension Father   . Hyperlipidemia Father   . Hypertension Mother   . Heart disease Mother   . Ovarian cancer Mother   . Cancer Mother        Lung cancer  . Asthma Mother   . COPD Mother   . Hyperlipidemia Mother   . Diabetes Brother   . Hypertension Brother   . Kidney disease Brother   . Asthma Brother   . Heart disease Brother   . Hyperlipidemia Brother   . Cancer Brother        Prostate  . Graves' disease Sister   . Diabetes Sister   . Breast cancer Sister 45  . Graves' disease Paternal Grandmother   . Hypertension Paternal Grandmother   . Heart disease Paternal Grandmother   . Alzheimer's disease Paternal Grandmother   . Cancer Maternal Grandfather   . Heart  failure Brother   . Heart failure Brother     Social History Social History   Tobacco Use  . Smoking status: Never Smoker  . Smokeless tobacco: Never Used  Substance Use Topics  . Alcohol use: Yes    Alcohol/week: 0.0 oz    Comment: very rare  . Drug use: No     Allergies  Quinapril   Review of Systems Review of Systems  Constitutional: Negative for appetite change and unexpected weight change.  HENT: Negative for congestion, ear discharge, ear pain and trouble swallowing.   Eyes: Negative for visual disturbance.  Respiratory: Negative for shortness of breath.   Cardiovascular: Negative for chest pain.  Gastrointestinal: Negative for abdominal pain.  Genitourinary: Negative for flank pain.  Musculoskeletal: Negative for back pain.  Skin: Negative for rash.  Neurological: Positive for dizziness. Negative for headaches.  Hematological: Negative for adenopathy.  Psychiatric/Behavioral: Negative for confusion.     Physical Exam Updated Vital Signs BP (!) 148/78   Pulse 80   Temp 97.6 F (36.4 C) (Oral)   Resp (!) 23   SpO2 96%   Physical Exam  Constitutional: She is oriented to person, place, and time. She appears well-developed.  Patient is obese  HENT:  Head: Atraumatic.  Bilateral TMs normal.  Eyes: EOM are normal. Pupils are equal, round, and reactive to light.  Mild nystagmus with end gaze to left or right.  Neck: Neck supple.  Cardiovascular: Normal rate.  Pulmonary/Chest: Effort normal.  Abdominal: There is no tenderness.  Musculoskeletal: She exhibits no edema.  Neurological: She is alert and oriented to person, place, and time.  good grip strength bilaterally.  Finger-nose intact bilaterally.  Awake and appropriate without slurred speech.  Skin: Capillary refill takes less than 2 seconds.     ED Treatments / Results  Labs (all labs ordered are listed, but only abnormal results are displayed) Labs Reviewed  COMPREHENSIVE METABOLIC PANEL -  Abnormal; Notable for the following components:      Result Value   Glucose, Bld 252 (*)    All other components within normal limits  TROPONIN I - Abnormal; Notable for the following components:   Troponin I 0.03 (*)    All other components within normal limits  CBC WITH DIFFERENTIAL/PLATELET - Abnormal; Notable for the following components:   WBC 11.4 (*)    RBC 5.20 (*)    All other components within normal limits  CBG MONITORING, ED - Abnormal; Notable for the following components:   Glucose-Capillary 278 (*)    All other components within normal limits    EKG  EKG Interpretation  Date/Time:  Thursday May 23 2017 08:55:14 EST Ventricular Rate:  86 PR Interval:    QRS Duration: 75 QT Interval:  380 QTC Calculation: 455 R Axis:   21 Text Interpretation:  Sinus rhythm Consider left atrial enlargement Anterior infarct, old No significant change since last tracing Confirmed by Davonna Belling [phone]) on 05/23/2017 9:14:36 AM       Radiology No results found.  Procedures Procedures (including critical care time)  Medications Ordered in ED Medications  metoprolol succinate (TOPROL-XL) 24 hr tablet 100 mg (100 mg Oral Given 05/23/17 0936)  amLODipine (NORVASC) tablet 10 mg (10 mg Oral Given 05/23/17 0936)  losartan (COZAAR) tablet 100 mg (100 mg Oral Given 05/23/17 0936)     Initial Impression / Assessment and Plan / ED Course  I have reviewed the triage vital signs and the nursing notes.  Pertinent labs & imaging results that were available during my care of the patient were reviewed by me and considered in my medical decision making (see chart for details).    Patient with vertigo.  Dizziness.  Does have some risk factors for a central cause.  Symptoms improved but still somewhat there.  Attempted MRI here at [facility] but she is too large for  the machine here.  Also had hypertension this morning.  Had had not had her morning blood pressure medicines.  Blood pressures  come down nicely with oral medications however she has bumped her troponin.  With the stroke rule out and the elevated troponin I feel she would benefit from likely observation to get the MRI to rule out a central cause of the vertigo and potentially trend the troponin.  Final Clinical Impressions(s) / ED Diagnoses   Final diagnoses:  Vertigo  Hypertension, unspecified type  Elevated troponin    ED Discharge Orders    None       Davonna Belling, MD 05/23/17 1219

## 2017-05-23 NOTE — ED Notes (Signed)
EDP [facility] aware of critical troponin of 0.03

## 2017-05-23 NOTE — Progress Notes (Signed)
Patient set up to go to MRI at [facility] at 8pm, transportation set up with [facility].  Patient aware.

## 2017-05-23 NOTE — ED Notes (Signed)
Report called to floor Rn.

## 2017-05-23 NOTE — ED Triage Notes (Signed)
Patient was on the way to work and started to feel bad stating she had dizziness like she has experienced in the past with vertigo. EMS brought her in. Patient states she feels a little better since she called EMS.

## 2017-05-23 NOTE — H&P (Addendum)
History and Physical    Rebekah Kennedy TOI:[id number] DOB: [birth year] DOA: 05/23/2017  Referring MD/NP/PA: Dr. Alvino Chapel   PCP: Binnie Rail, MD   Patient coming from: home  Chief Complaint: dizziness   HPI: Rebekah Kennedy is a 62 y.o. female with known hx of of hypertension, hyperlipidemia, previous TIA's, stroke in 2012, morbid obesity, presents to emergency department with main concern of sudden onset of dizziness while driving to work this morning. Patient describes sensation of room spinning and its worse with closing eyes. Patient reports similar events in the past where she was diagnosed with stroke in 2012. Patient explains that her symptoms are better now but she still feels dizzy. She denies any confusion, no visual changes, no headaches, no numbness or tingling. Patient also denies chest pain or dyspnea at this time but says in the past month she has had some right sided chest tightness, non radiating and typically self resolving, also reports, no specific abdominal or urinary concerns.  ED Course:  In emergency department,patient was hemodynamically stable, vital signs notable for initial blood pressure 207/110, otherwise stable, blood work notable for WBC 11.4, first troponin 0.03. TRH asked to admit for further evaluation and management. MRI was attempted but patient unable to affect.  Review of Systems:  Constitutional: Negative for fever, chills, diaphoresis HENT: Negative for ear pain, nosebleeds, congestion, facial swelling, rhinorrhea, neck pain, neck stiffness and ear discharge.   Eyes: Negative for pain, discharge, redness, itching and visual disturbance.  Respiratory: Negative for cough, choking, chest tightness, shortness of breath, wheezing and stridor.   Cardiovascular: Negative for palpitations and leg swelling.  Gastrointestinal: Negative for abdominal distention.  Genitourinary: Negative for dysuria, urgency, frequency, hematuria, flank pain, decreased urine  volume, difficulty urinating and dyspareunia.  Musculoskeletal: Negative for back pain, joint swelling, arthralgias and gait problem.  Neurological: Negative for seizures, syncope, facial asymmetry, speech difficulty, weakness Hematological: Negative for adenopathy. Does not bruise/bleed easily.  Psychiatric/Behavioral: Negative for hallucinations, behavioral problems, confusion, dysphoric mood, decreased concentration and agitation.   Past Medical History:  Diagnosis Date  . ASTHMA NOS W/ACUTE EXACERBATION 07/10/2010  . Cataract   . Cervical dysplasia   . CHEST PAIN 01/23/2007  . DEPRESSION 03/16/2007  . DIABETES MELLITUS, TYPE II 12/01/2006  . Dysmenorrhea   . Fibroid   . HYPERLIPIDEMIA 12/01/2006  . HYPERTENSION 07/07/2006  . Obesity   . Splenomegaly    in college  . 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 TIA (transient ischemic attack) 2012    Past Surgical History:  Procedure Laterality Date  . 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 APPENDECTOMY    . BIOPSY THYROID  05/02/11   Nonneoplastic goiter  . BREAST BIOPSY    . BREAST SURGERY     Reduction  . COLPOSCOPY    . DILATION AND CURETTAGE OF UTERUS  1975   DUB  . EYE SURGERY     Laser  . GYNECOLOGIC CRYOSURGERY    . MYOMECTOMY    . OVARIAN CYST REMOVAL    . PELVIC LAPAROSCOPY  75,88   DL lysis of adhesions   Social history:  reports that  has never smoked. she has never used smokeless tobacco. She reports that she drinks alcohol. She reports that she does not use drugs.  Allergies  Allergen Reactions  . Quinapril     cough    Family History  Problem Relation Age of Onset  . Cancer Maternal  Grandmother        Colon Cancer  . Asthma Maternal Grandmother   . Diabetes Father   . Heart disease Father   . Hypertension Father   . Hyperlipidemia Father   . Hypertension Mother   . Heart disease Mother   . Ovarian cancer Mother   . Cancer Mother        Lung cancer  . Asthma Mother   .  COPD Mother   . Hyperlipidemia Mother   . Diabetes Brother   . Hypertension Brother   . Kidney disease Brother   . Asthma Brother   . Heart disease Brother   . Hyperlipidemia Brother   . Cancer Brother        Prostate  . Graves' disease Sister   . Diabetes Sister   . Breast cancer Sister 3  . Graves' disease Paternal Grandmother   . Hypertension Paternal Grandmother   . Heart disease Paternal Grandmother   . Alzheimer's disease Paternal Grandmother   . Cancer Maternal Grandfather   . Heart failure Brother   . Heart failure Brother     Medication Sig  albuterol (PROVENTIL HFA;VENTOLIN HFA) 108 (90 BASE) MCG/ACT inhaler Inhale 2 puffs into the lungs every 4 (four) hours as needed for wheezing or shortness of breath.  amLODipine (NORVASC) 10 MG tablet TAKE ONE TABLET BY MOUTH ONE TIME DAILY  clopidogrel (PLAVIX) 75 MG tablet Take 1 tablet (75 mg total) by mouth daily.  losartan (COZAAR) 100 MG tablet TAKE 1 TAB BY MOUTH ONCE DAILY.  metoprolol succinate (TOPROL-XL) 100 MG 24 hr tablet TAKE 1 TABLET BY MOUTH DAILY WITH OR IMMEDIATELY FOLLOWING A MEAL  montelukast (SINGULAIR) 10 MG tablet Take 1 tablet (10 mg total) by mouth at bedtime. Patient taking differently: Take 10 mg by mouth daily.   spironolactone (ALDACTONE) 25 MG tablet Take 1 tablet (25 mg total) by mouth daily.  montelukast (SINGULAIR) 10 MG tablet TAKE 1 TABLET BY MOUTH AT BEDTIME Patient not taking: Reported on 05/23/2017  pravastatin (PRAVACHOL) 20 MG tablet Take 1 tablet (20 mg total) by mouth daily. Patient not taking: Reported on 04/30/2017    Physical Exam: Vitals:   05/23/17 0855 05/23/17 1000 05/23/17 1030 05/23/17 1100  BP: (!) 207/110 (!) 148/78 (!) 150/68 (!) 150/69  Pulse: 85 80 75 81  Resp: (!) 31 (!) 23 20 (!) 21  Temp: 97.6 F (36.4 C)     TempSrc: Oral     SpO2: 98% 96% 94% 96%    Constitutional: NAD, calm, comfortable Vitals:   05/23/17 0855 05/23/17 1000 05/23/17 1030 05/23/17 1100  BP:  (!) 207/110 (!) 148/78 (!) 150/68 (!) 150/69  Pulse: 85 80 75 81  Resp: (!) 31 (!) 23 20 (!) 21  Temp: 97.6 F (36.4 C)     TempSrc: Oral     SpO2: 98% 96% 94% 96%   Eyes: PERRL, lids and conjunctivae normal ENMT: Mucous membranes are moist. Posterior pharynx clear of any exudate or lesions.Normal dentition.  Neck: normal, supple, no masses, no thyromegaly Respiratory: clear to auscultation bilaterally, no wheezing, no crackles. Normal respiratory effort. No accessory muscle use.  Cardiovascular: Regular rate and rhythm, no rubs / gallops. 2+ pedal pulses. No carotid bruits.  Abdomen: no tenderness, no masses palpated. No hepatosplenomegaly. Bowel sounds positive.  Musculoskeletal: no clubbing / cyanosis. No joint deformity upper and lower extremities. Good ROM, no contractures. Normal muscle tone.  Skin: no rashes, lesions, ulcers. No induration Neurologic: CN 2-12 grossly intact.  Sensation intact, DTR normal. Strength 5/5 in all 4.  Psychiatric: Normal judgment and insight. Alert and oriented x 3. Normal mood.   Labs on Admission: I have personally reviewed following labs and imaging studies  CBC: Recent Labs  Lab 05/23/17 0922  WBC 11.4*  NEUTROABS 7.3  HGB 14.4  HCT 43.7  MCV 84.0  PLT 562   Basic Metabolic Panel: Recent Labs  Lab 05/23/17 0922  NA 138  K 4.1  CL 101  CO2 29  GLUCOSE 252*  BUN 15  CREATININE 0.76  CALCIUM 9.5   Liver Function Tests: Recent Labs  Lab 05/23/17 0922  AST 24  ALT 17  ALKPHOS 76  BILITOT 0.8  PROT 7.7  ALBUMIN 3.7   Cardiac Enzymes: Recent Labs  Lab 05/23/17 0922  TROPONINI 0.03*   CBG: Recent Labs  Lab 05/23/17 0858  GLUCAP 278*   Urine analysis:    Component Value Date/Time   COLORURINE YELLOW 01/25/2017 [place] 01/25/2017 0924   LABSPEC 1.013 01/25/2017 0924   PHURINE 5.0 01/25/2017 0924   GLUCOSEU 50 (A) 01/25/2017 0924   GLUCOSEU >=1000 (A) 06/21/2014 1549   HGBUR NEGATIVE 01/25/2017  0924   BILIRUBINUR NEGATIVE 01/25/2017 0924   BILIRUBINUR small 03/02/2014 1703   KETONESUR NEGATIVE 01/25/2017 0924   PROTEINUR 100 (A) 01/25/2017 0924   UROBILINOGEN 0.2 06/21/2014 1549   NITRITE NEGATIVE 01/25/2017 0924   LEUKOCYTESUR NEGATIVE 01/25/2017 0924   Radiological Exams on Admission: No results found.  EKG: pending  Assessment/Plan Active Problems:   Dizziness - Unclear etiology and certainly worrisome for TIA versus new stroke especially given patient's history of previous stroke and multiple risk factors including hypertension, hyperlipidemia, diabetes, morbid obesity - on physical exam, no specific neurological findings noted, pt able to swallow and is eating in the room - Will attempt MRI again for further evaluation - It is possible that severe hypertension also contributed - Agree with admission to telemetry unit - PT eval requested, check orthostatic vitals     Hypertensive urgency  - patient takes amlodipine 10 mg by mouth daily, losartan 100 mg by mouth daily, metoprolol 100 mg by mouth daily,spironolactone 25 mg by mouth daily - We'll continue same regimen here - Add hydralazine as needed for now    Elevated troponin - mild, no chest pain reported at this time but pt reports having some intermittent episodes in the recent past - cycle enzymes - ECHO requested     Diabetes mellitus type 2 - Resume home regimen with insulin glargine - Also add sliding scale insulin as well as nighttime coverage    Hyperlipidemia - Resume pravastatin 20 mg by mouth daily    Morbid obesity - Body mass index is 40.1 kg/m.   DVT prophylaxis: Lovenox Code Status: full Family Communication: Pt and family updated at bedside Disposition Plan: to be determined  Consults called: none Admission status: inpatient   Faye Ramsay MD [facility] Pager [phone]  If 7PM-7AM, please contact night-coverage [email or web address] Password [email or web address]  05/23/2017, 2:01 PM

## 2017-05-23 NOTE — ED Notes (Signed)
Bed: WU88 Expected date:  Expected time:  Means of arrival:  Comments: 62 yo lightheaded CBG 272

## 2017-05-24 ENCOUNTER — Inpatient Hospital Stay (HOSPITAL_COMMUNITY): Payer: BC Managed Care – PPO

## 2017-05-24 ENCOUNTER — Encounter: Payer: Self-pay | Admitting: Internal Medicine

## 2017-05-24 DIAGNOSIS — R42 Dizziness and giddiness: Secondary | ICD-10-CM

## 2017-05-24 DIAGNOSIS — R079 Chest pain, unspecified: Secondary | ICD-10-CM

## 2017-05-24 LAB — BASIC METABOLIC PANEL
ANION GAP: 8 (ref 5–15)
BUN: 18 mg/dL (ref 6–20)
CHLORIDE: 104 mmol/L (ref 101–111)
CO2: 27 mmol/L (ref 22–32)
Calcium: 9.2 mg/dL (ref 8.9–10.3)
Creatinine, Ser: 0.75 mg/dL (ref 0.44–1.00)
GFR calc Af Amer: >60 mL/min (ref >60)
GFR calc non Af Amer: >60 mL/min (ref >60)
Glucose, Bld: 226 mg/dL — ABNORMAL HIGH (ref 65–99)
POTASSIUM: 4.1 mmol/L (ref 3.5–5.1)
SODIUM: 139 mmol/L (ref 135–145)

## 2017-05-24 LAB — TROPONIN I: Troponin I: 0.04 ng/mL (ref <0.03)

## 2017-05-24 LAB — GLUCOSE, CAPILLARY
GLUCOSE-CAPILLARY: 213 mg/dL — AB (ref 65–99)
Glucose-Capillary: 180 mg/dL — ABNORMAL HIGH (ref 65–99)
Glucose-Capillary: 186 mg/dL — ABNORMAL HIGH (ref 65–99)
Glucose-Capillary: 206 mg/dL — ABNORMAL HIGH (ref 65–99)

## 2017-05-24 LAB — HIV ANTIBODY (ROUTINE TESTING W REFLEX): HIV Screen 4th Generation wRfx: NONREACTIVE

## 2017-05-24 LAB — CBC
HEMATOCRIT: 39.9 % (ref 36.0–46.0)
HEMOGLOBIN: 12.7 g/dL (ref 12.0–15.0)
MCH: 26.7 pg (ref 26.0–34.0)
MCHC: 31.8 g/dL (ref 30.0–36.0)
MCV: 84 fL (ref 78.0–100.0)
Platelets: 339 10*3/uL (ref 150–400)
RBC: 4.75 MIL/uL (ref 3.87–5.11)
RDW: 13.6 % (ref 11.5–15.5)
WBC: 10.9 10*3/uL — ABNORMAL HIGH (ref 4.0–10.5)

## 2017-05-24 LAB — ECHOCARDIOGRAM COMPLETE
HEIGHTINCHES: 66 in
Weight: 3960 oz

## 2017-05-24 LAB — MAGNESIUM: Magnesium: 1.8 mg/dL (ref 1.7–2.4)

## 2017-05-24 LAB — TSH: TSH: 1.471 u[IU]/mL (ref 0.350–4.500)

## 2017-05-24 MED ORDER — INSULIN GLARGINE 100 UNIT/ML ~~LOC~~ SOLN
100.0000 [IU] | Freq: Every day | SUBCUTANEOUS | Status: DC
  Administered 2017-05-24 – 2017-05-25 (×2): 100 [IU] via SUBCUTANEOUS
  Filled 2017-05-24 (×2): qty 1

## 2017-05-24 MED ORDER — INSULIN ASPART 100 UNIT/ML ~~LOC~~ SOLN
0.0000 [IU] | Freq: Three times a day (TID) | SUBCUTANEOUS | Status: DC
  Administered 2017-05-24: 5 [IU] via SUBCUTANEOUS
  Administered 2017-05-24: 3 [IU] via SUBCUTANEOUS

## 2017-05-24 MED ORDER — ADULT MULTIVITAMIN W/MINERALS CH
1.0000 | ORAL_TABLET | Freq: Every day | ORAL | Status: DC
  Administered 2017-05-24 – 2017-05-25 (×2): 1 via ORAL
  Filled 2017-05-24 (×2): qty 1

## 2017-05-24 NOTE — Progress Notes (Signed)
PROGRESS NOTE    Rebekah Kennedy  LZJ:[id number] DOB: 04-01-1956 DOA: 05/23/2017 PCP: Binnie Rail, MD   Brief Narrative: 62 y.o. female with known hx of of hypertension, hyperlipidemia, previous TIA's, stroke in 2012, morbid obesity, presents to emergency department with main concern of sudden onset of dizziness while driving to work this morning. Patient describes sensation of room spinning and its worse with closing eyes. Patient reports similar events in the past where she was diagnosed with stroke in 2012. Patient explains that her symptoms are better now but she still feels dizzy. She denies any confusion, no visual changes, no headaches, no numbness or tingling. Patient also denies chest pain or dyspnea at this time but says in the past month she has had some right sided chest tightness, non radiating and typically self resolving, also reports, no specific abdominal or urinary concerns.  ED Course:  In emergency department,patient was hemodynamically stable, vital signs notable for initial blood pressure 207/110, otherwise stable, blood work notable for WBC 11.4, first troponin 0.03. TRH asked to admit for further evaluation and management. MRI was attempted but patient unable to affect.   Assessment & Plan:   Active Problems:   Hypertensive urgency  Hypertensive urgency-I believe this is contributed to her dizziness.  MRI of the brain notedNo acute intracranial abnormality. 2. Few small remote right pontine lacunar infarcts. 3. Minimal chronic small vessel ischemic changes for age. Her blood pressure after admission has been anywhere from 1 3672-1 5395.  At the time of admission to the hospital her systolic blood pressure was over 200.  Patient reports that she has not missed any doses of antihypertensives at home. Continue metoprolol, losartan, amlodipine and spironolactone which she takes at home. An echocardiogram has been ordered at the time of admission.  A carotid ultrasound was  ordered by me this morning today.  Patient has history of TIA in 2012 presented with similar complaints.  She reports that she had an ultrasound in 2012 she does not think she has had another one since then.  Dm-restart Lantus at a lower dose.  Continue sliding scale insulin.  Hyperlipidemia continue pravastatin 20 mg daily   DVT prophylaxis: Lovenox Code Status: Full code  Family Communication: No family available at this time Disposition Plan TBD Consultants:  None Procedures: None  Antimicrobials: None  Subjective: Feels better but still dizzy upon walking to the bathroom.  However she tells me that she does have these episodes of dizziness at home.  She also had palpitations at the time of admission. Objective: Vitals:   05/24/17 0513 05/24/17 0625 05/24/17 0816 05/24/17 0920  BP: 139/71  (!) 143/65 136/72  Pulse: 86  88   Resp: 18     Temp: 98.3 F (36.8 C)     TempSrc: Oral     SpO2: 95%     Weight:  112.3 kg (247 lb 8 oz)    Height:        Intake/Output Summary (Last 24 hours) at 05/24/2017 1220 Last data filed at 05/24/2017 0803 Gross per 24 hour  Intake 720 ml  Output 800 ml  Net -80 ml   Filed Weights   05/23/17 1432 05/24/17 0625  Weight: 112.7 kg (248 lb 7.3 oz) 112.3 kg (247 lb 8 oz)    Examination:  General exam: Appears calm and comfortable  Respiratory system: Clear to auscultation. Respiratory effort normal. Cardiovascular system: S1 & S2 heard, RRR. No JVD, murmurs, rubs, gallops or clicks. No pedal edema.  Gastrointestinal system: Abdomen is nondistended, soft and nontender. No organomegaly or masses felt. Normal bowel sounds heard. Central nervous system: Alert and oriented. No focal neurological deficits. Extremities: Symmetric 5 x 5 power. Skin: No rashes, lesions or ulcers Psychiatry: Judgement and insight appear normal. Mood & affect appropriate.     Data Reviewed: I have personally reviewed following labs and imaging studies  CBC: Recent  Labs  Lab 05/23/17 0922 05/24/17 0436  WBC 11.4* 10.9*  NEUTROABS 7.3  --   HGB 14.4 12.7  HCT 43.7 39.9  MCV 84.0 84.0  PLT 341 937   Basic Metabolic Panel: Recent Labs  Lab 05/23/17 0922 05/24/17 0436  NA 138 139  K 4.1 4.1  CL 101 104  CO2 29 27  GLUCOSE 252* 226*  BUN 15 18  CREATININE 0.76 0.75  CALCIUM 9.5 9.2   GFR: Estimated Creatinine Clearance: 95 mL/min (by C-G formula based on SCr of 0.75 mg/dL). Liver Function Tests: Recent Labs  Lab 05/23/17 0922  AST 24  ALT 17  ALKPHOS 76  BILITOT 0.8  PROT 7.7  ALBUMIN 3.7   No results for input(s): LIPASE, AMYLASE in the last 168 hours. No results for input(s): AMMONIA in the last 168 hours. Coagulation Profile: No results for input(s): INR, PROTIME in the last 168 hours. Cardiac Enzymes: Recent Labs  Lab 05/23/17 0922 05/23/17 1429 05/23/17 2229 05/24/17 0436  TROPONINI 0.03* 0.05* 0.05* 0.04*   BNP (last 3 results) No results for input(s): PROBNP in the last 8760 hours. HbA1C: Recent Labs    05/23/17 1429  HGBA1C 8.3*   CBG: Recent Labs  Lab 05/23/17 0858 05/23/17 1653 05/23/17 2235 05/24/17 0730  GLUCAP 278* 179* 291* 180*   Lipid Profile: No results for input(s): CHOL, HDL, LDLCALC, TRIG, CHOLHDL, LDLDIRECT in the last 72 hours. Thyroid Function Tests: Recent Labs    05/23/17 1429  TSH 1.270   Anemia Panel: No results for input(s): VITAMINB12, FOLATE, FERRITIN, TIBC, IRON, RETICCTPCT in the last 72 hours. Sepsis Labs: No results for input(s): PROCALCITON, LATICACIDVEN in the last 168 hours.  No results found for this or any previous visit (from the past 240 hour(s)).       Radiology Studies: Mr Brain 61 Contrast  Result Date: 05/24/2017 CLINICAL DATA:  Initial evaluation for acute sudden onset dizziness, ataxia. EXAM: MRI HEAD WITHOUT CONTRAST TECHNIQUE: Multiplanar, multiecho pulse sequences of the brain and surrounding structures were obtained without intravenous  contrast. COMPARISON:  Previous MRI from 02/15/2017. FINDINGS: Brain: Generalized age related cerebral atrophy. Mild patchy T2/FLAIR hyperintensity within the periventricular and deep white matter both cerebral hemispheres, nonspecific, but most like related chronic small vessel ischemic changes, mild for age. Few small remote lacunar infarcts present within the right paramedian pons. No abnormal foci of restricted diffusion to suggest acute or subacute ischemia. Gray-white matter differentiation maintained. No other areas of remote cortical infarction. No evidence for acute or chronic intracranial hemorrhage. No mass lesion, midline shift or mass effect. No hydrocephalus. No extra-axial fluid collection. Pituitary gland suprasellar region normal. Midline structures intact and normal. Vascular: Major intracranial vascular flow voids are maintained at the skullbase. Left vertebral artery tortuous and invaginate CED upon the medulla, stable from previous. Skull and upper cervical spine: Craniocervical junction within normal limits. Upper cervical spine normal. Bone marrow signal intensity within normal limits. No scalp soft tissue abnormality. Sinuses/Orbits: Globes and orbital soft tissues within normal limits. Patient status post cataract extraction bilaterally. Paranasal sinuses are clear. No significant mastoid  effusion. Inner ear structures normal. Other: None. IMPRESSION: 1. No acute intracranial abnormality. 2. Few small remote right pontine lacunar infarcts. 3. Minimal chronic small vessel ischemic changes for age. Electronically Signed   By: Jeannine Boga M.D.   On: 05/24/2017 03:36        Scheduled Meds: . amLODipine  10 mg Oral Daily  . clopidogrel  75 mg Oral Daily  . enoxaparin (LOVENOX) injection  40 mg Subcutaneous Q24H  . insulin aspart  0-15 Units Subcutaneous TID WC  . insulin aspart  0-5 Units Subcutaneous QHS  . insulin glargine  100 Units Subcutaneous Q breakfast  . losartan   100 mg Oral Daily  . metoprolol succinate  100 mg Oral Daily  . montelukast  10 mg Oral QHS  . multivitamin with minerals  1 tablet Oral Daily  . spironolactone  25 mg Oral Daily   Continuous Infusions:   LOS: 1 day      Georgette Shell, MD [facility]  If 7PM-7AM, please contact night-coverage [email or web address] Password [email or web address] 05/24/2017, 12:20 PM

## 2017-05-24 NOTE — Evaluation (Signed)
Physical Therapy Evaluation Patient Details Name: Rebekah Kennedy MRN: [id number] DOB: Sep 11, 1955 Today's Date: 05/24/2017   History of Present Illness  62 y.o. female with known hx of of  hypertension, hyperlipidemia, previous TIA's, stroke in 2012, morbid obesity, presents to emergency department with main concern of sudden onset of dizziness while driving to work this morning. Patient describes sensation of room spinning and its worse with closing eyes. Patient reports similar events in the past where she was diagnosed with stroke in 2012  Clinical Impression  Patient evaluated by Physical Therapy with no further acute PT needs identified. All education has been completed and the patient has no further questions.  See below for any follow-up Physical Therapy or equipment needs. PT is signing off. Thank you for this referral. Pt denies any current mobility issues and feels she is at her baseline, no LOB with gait/dynamic activities; pt states she has a cane at home if she should feel she needs to use it    Follow Up Recommendations No PT follow up    Equipment Recommendations  None recommended by PT    Recommendations for Other Services       Precautions / Restrictions Precautions Precautions: None Restrictions Weight Bearing Restrictions: No      Mobility  Bed Mobility               General bed mobility comments: pt on EOB  Transfers Overall transfer level: Modified independent Equipment used: None             General transfer comment: incr time for safety  Ambulation/Gait Ambulation/Gait assistance: Supervision Ambulation Distance (Feet): 110 Feet Assistive device: None Gait Pattern/deviations: Step-through pattern     General Gait Details: no LOB, denies dizziness, initial rail touch but not needed after first 10'  Stairs            Wheelchair Mobility    Modified Rankin (Stroke Patients Only)       Balance   Sitting-balance support: No  upper extremity supported;Feet supported Sitting balance-Leahy Scale: Good     Standing balance support: No upper extremity supported Standing balance-Leahy Scale: Normal Standing balance comment: no LOB with dynamic activities             High level balance activites: Backward walking;Turns;Sudden stops;Head turns;Direction changes High Level Balance Comments: no LOB             Pertinent Vitals/Pain Pain Assessment: No/denies pain    Home Living Family/patient expects to be discharged to:: Private residence Living Arrangements: Alone             Home Equipment: Kasandra Knudsen - single point      Prior Function Level of Independence: Independent         Comments: pt was driving to work when she felt dizzy, works at [employer]        Extremity/Trunk Assessment   Upper Extremity Assessment Upper Extremity Assessment: Overall WFL for tasks assessed(reports fingertip numbness)    Lower Extremity Assessment Lower Extremity Assessment: Overall WFL for tasks assessed       Communication   Communication: No difficulties  Cognition Arousal/Alertness: Awake/alert Behavior During Therapy: WFL for tasks assessed/performed Overall Cognitive Status: Within Functional Limits for tasks assessed  General Comments      Exercises     Assessment/Plan    PT Assessment Patent does not need any further PT services  PT Problem List         PT Treatment Interventions      PT Goals (Current goals can be found in the Care Plan section)  Acute Rehab PT Goals Patient Stated Goal: find out why she had these symptoms PT Goal Formulation: All assessment and education complete, DC therapy    Frequency     Barriers to discharge        Co-evaluation               AM-PAC PT "6 Clicks" Daily Activity  Outcome Measure Difficulty turning over in bed (including adjusting  bedclothes, sheets and blankets)?: None Difficulty moving from lying on back to sitting on the side of the bed? : None Difficulty sitting down on and standing up from a chair with arms (e.g., wheelchair, bedside commode, etc,.)?: None Help needed moving to and from a bed to chair (including a wheelchair)?: None Help needed walking in hospital room?: None Help needed climbing 3-5 steps with a railing? : None 6 Click Score: 24    End of Session   Activity Tolerance: Patient tolerated treatment well Patient left: with call bell/phone within reach;Other (comment)(EOB as on arrival) Nurse Communication: Mobility status PT Visit Diagnosis: Other abnormalities of gait and mobility (R26.89)    Time: 1700-1749 PT Time Calculation (min) (ACUTE ONLY): 29 min   Charges:   PT Evaluation $PT Eval Low Complexity: 1 Low     PT G CodesKenyon Ana, PT Pager: [phone] 05/24/2017   [facility] 05/24/2017, 3:04 PM

## 2017-05-24 NOTE — Progress Notes (Signed)
Inpatient Diabetes Program Recommendations  AACE/ADA: New Consensus Statement on Inpatient Glycemic Control (2015)  Target Ranges:  Prepandial:   less than 140 mg/dL      Peak postprandial:   less than 180 mg/dL (1-2 hours)      Critically ill patients:  140 - 180 mg/dL   Lab Results  Component Value Date   GLUCAP 180 (H) 05/24/2017   HGBA1C 8.3 (H) 05/23/2017    Review of Glycemic Control  Diabetes history: DM2 Outpatient Diabetes medications: Basaglar 460 units QAM Current orders for Inpatient glycemic control: Lantus 100 units QD, Novolog 0-15 units tidwc and hs  HgbA1C - 8.3%  Spoke with pt at length regarding her diabetes control at home. Endo is Dr. Loanne Drilling. Awaiting bariatric surgery. Surgeon said to decrease basal insulin to 100 units QD. Pt states she has gained a lot of weight since being prescribed extremely large doses of basaglar insulin. Would likely have better control with basal-bolus regimen. Pt willing to take meal coverage insulin. Discussed checking blood sugars 3-4x/day to maintain better control. Answered questions and seems motivated to make changes with diet before bariatric surgery.  Inpatient Diabetes Program Recommendations:    Change diet to CHO mod med Add Novolog 4 units tidwc for meal coverage insulin if pt eats > 50% meal. May need less basal while inpatient. Follow daily for insulin adjustments by MD.  Continue to watch trends.  Thank you. Lorenda Peck, RD, LDN, CDE Inpatient Diabetes Coordinator [phone]

## 2017-05-25 LAB — GLUCOSE, CAPILLARY
GLUCOSE-CAPILLARY: 219 mg/dL — AB (ref 65–99)
Glucose-Capillary: 113 mg/dL — ABNORMAL HIGH (ref 65–99)

## 2017-05-25 MED ORDER — INSULIN ASPART 100 UNIT/ML ~~LOC~~ SOLN: SUBCUTANEOUS | 3 refills | Status: DC

## 2017-05-25 MED ORDER — BASAGLAR KWIKPEN 100 UNIT/ML ~~LOC~~ SOPN: 100.0000 [IU] | PEN_INJECTOR | SUBCUTANEOUS | 11 refills | Status: DC

## 2017-05-25 MED ORDER — SPIRONOLACTONE 25 MG PO TABS: 50.0000 mg | ORAL_TABLET | Freq: Every day | ORAL | 1 refills | Status: DC

## 2017-05-25 NOTE — Discharge Summary (Addendum)
Physician Discharge Summary  Rebekah Kennedy:[id number] DOB: 10-01-1955 DOA: 05/23/2017  PCP: Binnie Rail, MD  Admit date: 05/23/2017 Discharge date: 05/25/2017  Admitted From:home Disposition: home  Recommendations for Outpatient Follow-up:  1. Follow up with PCP in 1-2 weeks  2Please obtain BMP/CBC in one week Home Healthnone Equipment/Devices none Discharge Condition: Stable CODE STATUS full code Diet recommendation: Cardiac carb modified/  Brief/Interim Summary:62 y.o.femalewithknown hx of of hypertension, hyperlipidemia, previous TIA's, stroke in 2012, morbid obesity, presents to emergency department with main concern of sudden onset of dizziness while driving to work this morning. Patient describes sensation of room spinning and its worse with closing eyes. Patient reports similar events in the past where she was diagnosed with stroke in 2012. Patient explains that her symptoms are better now but she still feels dizzy. She denies any confusion, no visual changes, no headaches, no numbness or tingling. Patient also denies chest pain or dyspneaat this time but says in the past month she has had some right sided chest tightness, non radiating and typically self resolving, also reports, no specific abdominal or urinary concerns.  ED Course:In emergency department,patient was hemodynamically stable, vital signs notable for initial blood pressure 207/110, otherwise stable, blood work notable for WBC 11.4, first troponin 0.03. TRH asked to admit for further evaluation and management. MRI was attempted but patient unable to affect.  Hospital course-  Hypertensive urgency-I believe this is contributed to her dizziness.  MRI of the brain notedNo acute intracranial abnormality. 2. Few small remote right pontine lacunar infarcts. 3. Minimal chronic small vessel ischemic changes for age. Her blood pressure after admission has been anywhere from 1 36/72-1 53/95.  At the time of  admission to the hospital her systolic blood pressure was over 200.  Patient reports that she has not missed any doses of antihypertensives at home. Continue metoprolol, losartan, amlodipine and spironolactone which she takes at home.  I have increased the dose of spironolactone to 50 mg daily.  An echocardiogram has been ordered at the time of admission shows ejection fraction 60-65% normal wall thickness of the left ventricle normal wall motion pulmonary artery systolic pressure was within normal range. A carotid ultrasound was no evidence of blockage bilaterally. Dm-restart Lantus at a lower dose.  Continue sliding scale insulin.  Hyperlipidemia continue pravastatin 20 mg daily patient reported that she was not taking this at home because 1 of her friend got diagnosed with ALS from taking pravastatin.  I have encouraged her to take pravastatin for hyperlipidemia diabetes and metabolic syndrome.    Discharge Diagnoses:  Active Problems:   Hypertensive urgency Status post hypertensive urgency continue all the for home medications as above-which includes metoprolol losartan amlodipine and spironolactone.  Increase the dose of Spironolactone to 50 mg daily  Type 2 diabetes patient reportedly is not taking 400 units of insulin at home.  She reported that she takes only 100 units of insulin at home which was restarted here her blood sugars have been stable in the low 100s.  He is talking with the surgical group for bariatric surgery.  Also added NovoLog 5 units 3 times a day with meals.   Discharge Instructions   Allergies as of 05/25/2017      Reactions   Quinapril    cough      Medication List    STOP taking these medications   meloxicam 15 MG tablet Commonly known as:  MOBIC   triamcinolone cream 0.1 % Commonly known as:  KENALOG  TAKE these medications   albuterol 108 (90 Base) MCG/ACT inhaler Commonly known as:  PROVENTIL HFA;VENTOLIN HFA Inhale 2 puffs into the lungs every  4 (four) hours as needed for wheezing or shortness of breath.   amLODipine 10 MG tablet Commonly known as:  NORVASC TAKE ONE TABLET BY MOUTH ONE TIME DAILY   BASAGLAR KWIKPEN 100 UNIT/ML Sopn Inject 1 mL (100 Units total) into the skin every morning. And pen needles 4/day What changed:  how much to take   BD ULTRA-FINE PEN NEEDLES 29G X 12.7MM Misc Generic drug:  Insulin Pen Needle USE 3 TIMES DAILY AS DIRECTED   blood glucose meter kit and supplies Kit One touch verio. Use up to four times daily as directed. (FOR ICD-9 250.00, 250.01).   clopidogrel 75 MG tablet Commonly known as:  PLAVIX Take 1 tablet (75 mg total) by mouth daily.   insulin aspart 100 UNIT/ML injection Commonly known as:  NOVOLOG 5 units three times just before meals.   losartan 100 MG tablet Commonly known as:  COZAAR TAKE 1 TAB BY MOUTH ONCE DAILY.   LUBRICATING EYE DROPS OP Place 1 drop into both eyes daily as needed (dry eyes).   metoprolol succinate 100 MG 24 hr tablet Commonly known as:  TOPROL-XL TAKE 1 TABLET BY MOUTH DAILY WITH OR IMMEDIATELY FOLLOWING A MEAL   montelukast 10 MG tablet Commonly known as:  SINGULAIR Take 1 tablet (10 mg total) by mouth at bedtime. What changed:    when to take this  Another medication with the same name was removed. Continue taking this medication, and follow the directions you see here.   multivitamin tablet Take 1 tablet by mouth daily.   pravastatin 20 MG tablet Commonly known as:  PRAVACHOL Take 1 tablet (20 mg total) by mouth daily.   spironolactone 25 MG tablet Commonly known as:  ALDACTONE Take 2 tablets (50 mg total) by mouth daily. What changed:  how much to take       Allergies  Allergen Reactions  . Quinapril     cough    Consultations:  none   Procedures/Studies: Mr Brain Wo Contrast  Result Date: 05/24/2017 CLINICAL DATA:  Initial evaluation for acute sudden onset dizziness, ataxia. EXAM: MRI HEAD WITHOUT CONTRAST  TECHNIQUE: Multiplanar, multiecho pulse sequences of the brain and surrounding structures were obtained without intravenous contrast. COMPARISON:  Previous MRI from 02/15/2017. FINDINGS: Brain: Generalized age related cerebral atrophy. Mild patchy T2/FLAIR hyperintensity within the periventricular and deep white matter both cerebral hemispheres, nonspecific, but most like related chronic small vessel ischemic changes, mild for age. Few small remote lacunar infarcts present within the right paramedian pons. No abnormal foci of restricted diffusion to suggest acute or subacute ischemia. Gray-white matter differentiation maintained. No other areas of remote cortical infarction. No evidence for acute or chronic intracranial hemorrhage. No mass lesion, midline shift or mass effect. No hydrocephalus. No extra-axial fluid collection. Pituitary gland suprasellar region normal. Midline structures intact and normal. Vascular: Major intracranial vascular flow voids are maintained at the skullbase. Left vertebral artery tortuous and invaginate CED upon the medulla, stable from previous. Skull and upper cervical spine: Craniocervical junction within normal limits. Upper cervical spine normal. Bone marrow signal intensity within normal limits. No scalp soft tissue abnormality. Sinuses/Orbits: Globes and orbital soft tissues within normal limits. Patient status post cataract extraction bilaterally. Paranasal sinuses are clear. No significant mastoid effusion. Inner ear structures normal. Other: None. IMPRESSION: 1. No acute intracranial abnormality. 2. Few small  remote right pontine lacunar infarcts. 3. Minimal chronic small vessel ischemic changes for age. Electronically Signed   By: Jeannine Boga M.D.   On: 05/24/2017 03:36    (Echo, Carotid, EGD, Colonoscopy, ERCP)    Subjective:   Discharge Exam: Vitals:   05/25/17 0536 05/25/17 0915  BP: 132/60 (!) 151/81  Pulse: 78 91  Resp: 18   Temp: 98.2 F (36.8 C)    SpO2: 98%    Vitals:   05/24/17 1740 05/24/17 2058 05/25/17 0536 05/25/17 0915  BP: 129/69 (!) 135/98 132/60 (!) 151/81  Pulse: 92 87 78 91  Resp:  18 18   Temp:  98.6 F (37 C) 98.2 F (36.8 C)   TempSrc:  Oral Oral   SpO2: 97% 93% 98%   Weight:   112.5 kg (248 lb)   Height:        General: Pt is alert, awake, not in acute distress Cardiovascular: RRR, S1/S2 +, no rubs, no gallops Respiratory: CTA bilaterally, no wheezing, no rhonchi Abdominal: Soft, NT, ND, bowel sounds + Extremities: no edema, no cyanosis    The results of significant diagnostics from this hospitalization (including imaging, microbiology, ancillary and laboratory) are listed below for reference.     Microbiology: No results found for this or any previous visit (from the past 240 hour(s)).   Labs: BNP (last 3 results) No results for input(s): BNP in the last 8760 hours. Basic Metabolic Panel: Recent Labs  Lab 05/23/17 0922 05/24/17 0436 05/24/17 1343  NA 138 139  --   K 4.1 4.1  --   CL 101 104  --   CO2 29 27  --   GLUCOSE 252* 226*  --   BUN 15 18  --   CREATININE 0.76 0.75  --   CALCIUM 9.5 9.2  --   MG  --   --  1.8   Liver Function Tests: Recent Labs  Lab 05/23/17 0922  AST 24  ALT 17  ALKPHOS 76  BILITOT 0.8  PROT 7.7  ALBUMIN 3.7   No results for input(s): LIPASE, AMYLASE in the last 168 hours. No results for input(s): AMMONIA in the last 168 hours. CBC: Recent Labs  Lab 05/23/17 0922 05/24/17 0436  WBC 11.4* 10.9*  NEUTROABS 7.3  --   HGB 14.4 12.7  HCT 43.7 39.9  MCV 84.0 84.0  PLT 341 339   Cardiac Enzymes: Recent Labs  Lab 05/23/17 0922 05/23/17 1429 05/23/17 2229 05/24/17 0436  TROPONINI 0.03* 0.05* 0.05* 0.04*   BNP: Invalid input(s): POCBNP CBG: Recent Labs  Lab 05/24/17 0730 05/24/17 1143 05/24/17 1629 05/24/17 2054 05/25/17 0742  GLUCAP 180* 186* 206* 213* 113*   D-Dimer No results for input(s): DDIMER in the last 72 hours. Hgb  A1c Recent Labs    05/23/17 1429  HGBA1C 8.3*   Lipid Profile No results for input(s): CHOL, HDL, LDLCALC, TRIG, CHOLHDL, LDLDIRECT in the last 72 hours. Thyroid function studies Recent Labs    05/24/17 1346  TSH 1.471   Anemia work up No results for input(s): VITAMINB12, FOLATE, FERRITIN, TIBC, IRON, RETICCTPCT in the last 72 hours. Urinalysis    Component Value Date/Time   COLORURINE YELLOW 01/25/2017 [place] 01/25/2017 0924   LABSPEC 1.013 01/25/2017 0924   PHURINE 5.0 01/25/2017 0924   GLUCOSEU 50 (A) 01/25/2017 0924   GLUCOSEU >=1000 (A) 06/21/2014 1549   HGBUR NEGATIVE 01/25/2017 0924   BILIRUBINUR NEGATIVE 01/25/2017 0924   BILIRUBINUR small 03/02/2014  [place] 01/25/2017 0924   PROTEINUR 100 (A) 01/25/2017 0924   UROBILINOGEN 0.2 06/21/2014 1549   NITRITE NEGATIVE 01/25/2017 0924   LEUKOCYTESUR NEGATIVE 01/25/2017 0924   Sepsis Labs Invalid input(s): PROCALCITONIN,  WBC,  LACTICIDVEN Microbiology No results found for this or any previous visit (from the past 240 hour(s)).   Time coordinating discharge: Over 30 minutes  SIGNED:   Georgette Shell, MD  [facility] 05/25/2017, 9:54 AM Pager   If 7PM-7AM, please contact night-coverage [email or web address] Password [email or web address]

## 2017-05-25 NOTE — Progress Notes (Signed)
D/c to home via w/c w/ friend voices no c/o

## 2017-05-25 NOTE — Progress Notes (Signed)
D/cing to home w/ friend all belongings scripts and d/c instructions given w/ verbal understanding.Awaiting ride

## 2017-05-27 ENCOUNTER — Ambulatory Visit: Payer: BC Managed Care – PPO | Admitting: Endocrinology

## 2017-05-27 DIAGNOSIS — Z0289 Encounter for other administrative examinations: Secondary | ICD-10-CM

## 2017-05-28 ENCOUNTER — Ambulatory Visit: Payer: BC Managed Care – PPO | Admitting: Endocrinology

## 2017-05-28 ENCOUNTER — Other Ambulatory Visit: Payer: Self-pay | Admitting: Radiology

## 2017-05-30 ENCOUNTER — Ambulatory Visit: Payer: BC Managed Care – PPO | Admitting: Endocrinology

## 2017-05-31 ENCOUNTER — Ambulatory Visit: Payer: BC Managed Care – PPO | Admitting: Endocrinology

## 2017-05-31 ENCOUNTER — Encounter: Payer: Self-pay | Admitting: Endocrinology

## 2017-05-31 VITALS — BP 138/82 | HR 87 | Temp 97.6°F | Wt 246.0 lb

## 2017-05-31 DIAGNOSIS — Z794 Long term (current) use of insulin: Secondary | ICD-10-CM

## 2017-05-31 DIAGNOSIS — E1151 Type 2 diabetes mellitus with diabetic peripheral angiopathy without gangrene: Secondary | ICD-10-CM

## 2017-05-31 LAB — POCT GLYCOSYLATED HEMOGLOBIN (HGB A1C): Hemoglobin A1C: 8.5

## 2017-05-31 NOTE — Patient Instructions (Addendum)
Please continue the same insulins for now.  On this type of insulin schedule, you should eat meals on a regular schedule.  If a meal is missed or significantly delayed, your blood sugar could go low.  Also, you should eat a light snack at bedtime, to avoid it going low in the middle of the night.  Continue working toward the weight loss surgery.  This is the best treatment for the diabetes.   Also, please see Dr Quay Burow, for hospital follow up.   check your blood sugar twice a day.  vary the time of day when you check, between before the 3 meals, and at bedtime.  also check if you have symptoms of your blood sugar being too high or too low.  please keep a record of the readings and bring it to your next appointment here.  please call us sooner if you are having low blood sugar episodes, or if it stays over 200.   Please come back for a follow-up appointment in 2 months.

## 2017-05-31 NOTE — Progress Notes (Signed)
Subjective:    Patient ID: Rebekah Kennedy, female    DOB: 07/09/1955, 62 y.o.   MRN: [id number]  HPI Pt returns for f/u of diabetes mellitus:  DM type: Insulin-requiring type 2.  Dx'ed: 2004.  Complications: CVA, nephropathy, polyneuropathy, and prolif retinopathy.  Therapy: insulin since 2013 GDM: never.  DKA: never Severe hypoglycemia: once, in 2018.  Pancreatitis: never.  Other: due to noncompliance with insulin injections and cbg monitoring, she is on a qd insulin regimen.   Interval history: 2 weeks ago, she had another near-syncopal episode.  This happened at 7:30 AM, right after breakfast.  No cause was found. She takes basaglar, 100 units qd, and novolog 5 units 3 times a day (just before each meal), but she sometimes misses this.  Bariatric surg has been delayed until 07/19/17.  she brings her meter with her cbg's which I have reviewed today.  It varies from 160-300.  There is no trend throughout the day.   Past Medical History:  Diagnosis Date  . ASTHMA NOS W/ACUTE EXACERBATION 07/10/2010  . Cataract   . Cervical dysplasia   . CHEST PAIN 01/23/2007  . DEPRESSION 03/16/2007  . DIABETES MELLITUS, TYPE II 12/01/2006  . Dysmenorrhea   . Fibroid   . HYPERLIPIDEMIA 12/01/2006  . HYPERTENSION 07/07/2006  . Obesity   . Splenomegaly    in college  . Stroke [facility]) 2012   R pontine, mild residual left hemiparesis  . TIA (transient ischemic attack) 2012    Past Surgical History:  Procedure Laterality Date  . 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 APPENDECTOMY    . BIOPSY THYROID  05/02/11   Nonneoplastic goiter  . BREAST BIOPSY    . BREAST SURGERY     Reduction  . COLPOSCOPY    . DILATION AND CURETTAGE OF UTERUS  1975   DUB  . EYE SURGERY     Laser  . GYNECOLOGIC CRYOSURGERY    . MYOMECTOMY    . OVARIAN CYST REMOVAL    . PELVIC LAPAROSCOPY  75,88   DL lysis of adhesions    Social History   Socioeconomic History  . Marital status: Single    Spouse name: Not on file  .  Number of children: 2  . Years of education: Not on file  . Highest education level: Not on file  Social Needs  . Financial resource strain: Not on file  . Food insecurity - worry: Not on file  . Food insecurity - inability: Not on file  . Transportation needs - medical: Not on file  . Transportation needs - non-medical: Not on file  Occupational History  . Occupation: Product manager: [employer]  Tobacco Use  . Smoking status: Never Smoker  . Smokeless tobacco: Never Used  Substance and Sexual Activity  . Alcohol use: Yes    Alcohol/week: 0.0 oz    Comment: very rare  . Drug use: No  . Sexual activity: Not Currently    Birth control/protection: Post-menopausal    Comment: 1st intercourse 62 yo-Fewer than 5 partners  Other Topics Concern  . 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 albuterol (PROVENTIL HFA;VENTOLIN HFA) 108 (90 BASE) MCG/ACT inhaler Inhale 2 puffs into the lungs every 4 (four)  hours as needed for wheezing or shortness of breath. 18 g 2  . amLODipine (NORVASC) 10 MG tablet TAKE ONE TABLET BY MOUTH ONE TIME DAILY 90 tablet 1  . BD ULTRA-FINE PEN NEEDLES 29G X 12.7MM MISC USE 3 TIMES DAILY AS DIRECTED 100 each 1  . blood glucose meter kit and supplies KIT One touch verio. Use up to four times daily as directed. (FOR ICD-9 250.00, 250.01). 1 each 0  . Carboxymethylcellul-Glycerin (LUBRICATING EYE DROPS OP) Place 1 drop into both eyes daily as needed (dry eyes).    . clopidogrel (PLAVIX) 75 MG tablet Take 1 tablet (75 mg total) by mouth daily. 90 tablet 3  . insulin aspart (NOVOLOG) 100 UNIT/ML injection 5 units three times just before meals. 10 mL 3  . Insulin Glargine (BASAGLAR KWIKPEN) 100 UNIT/ML SOPN Inject 1 mL (100 Units total) into the skin every morning. And pen  needles 4/day 55 pen 11  . losartan (COZAAR) 100 MG tablet TAKE 1 TAB BY MOUTH ONCE DAILY. 90 tablet 1  . metoprolol succinate (TOPROL-XL) 100 MG 24 hr tablet TAKE 1 TABLET BY MOUTH DAILY WITH OR IMMEDIATELY FOLLOWING A MEAL 90 tablet 1  . montelukast (SINGULAIR) 10 MG tablet Take 1 tablet (10 mg total) by mouth at bedtime. (Patient taking differently: Take 10 mg by mouth daily. ) 90 tablet 3  . Multiple Vitamin (MULTIVITAMIN) tablet Take 1 tablet by mouth daily.      . pravastatin (PRAVACHOL) 20 MG tablet Take 1 tablet (20 mg total) by mouth daily. 90 tablet 3  . spironolactone (ALDACTONE) 25 MG tablet Take 2 tablets (50 mg total) by mouth daily. 90 tablet 1   No current facility-administered medications on file prior to visit.     Allergies  Allergen Reactions  . Quinapril     cough    Family History  Problem Relation Age of Onset  . Cancer Maternal Grandmother        Colon Cancer  . Asthma Maternal Grandmother   . Diabetes Father   . Heart disease Father   . Hypertension Father   . Hyperlipidemia Father   . Hypertension Mother   . Heart disease Mother   . Ovarian cancer Mother   . Cancer Mother        Lung cancer  . Asthma Mother   . COPD Mother   . Hyperlipidemia Mother   . Diabetes Brother   . Hypertension Brother   . Kidney disease Brother   . Asthma Brother   . Heart disease Brother   . Hyperlipidemia Brother   . Cancer Brother        Prostate  . Graves' disease Sister   . Diabetes Sister   . Breast cancer Sister 37  . Graves' disease Paternal Grandmother   . Hypertension Paternal Grandmother   . Heart disease Paternal Grandmother   . Alzheimer's disease Paternal Grandmother   . Cancer Maternal Grandfather   . Heart failure Brother   . Heart failure Brother     BP 138/82 (BP Location: Left Arm, Patient Position: Sitting, Cuff Size: Normal)   Pulse 87   Temp 97.6 F (36.4 C)   Wt 246 lb (111.6 kg)   SpO2 97%   BMI 39.71 kg/m   Review of  Systems She denies weight change.     Objective:   Physical Exam VITAL SIGNS:  See vs page GENERAL: no distress Pulses: foot pulses are intact bilaterally.   MSK: no deformity of the  feet or ankles.  CV: no edema of the legs.  Skin:  no ulcer on the feet or ankles.  normal color and temp on the feet and ankles.   Neuro: sensation is intact to touch on the feet and ankles, but decreased from normal.    Lab Results  Component Value Date   HGBA1C 8.3 (H) 05/23/2017       Assessment & Plan:  Insulin-requiring type 2 DM: ongoing poor glycemic control.  Obesity: unchanged despite renewed dietary effort  Patient Instructions  Please continue the same insulins for now.  On this type of insulin schedule, you should eat meals on a regular schedule.  If a meal is missed or significantly delayed, your blood sugar could go low.  Also, you should eat a light snack at bedtime, to avoid it going low in the middle of the night.  Continue working toward the weight loss surgery.  This is the best treatment for the diabetes.   Also, please see Dr Quay Burow, for hospital follow up.   check your blood sugar twice a day.  vary the time of day when you check, between before the 3 meals, and at bedtime.  also check if you have symptoms of your blood sugar being too high or too low.  please keep a record of the readings and bring it to your next appointment here.  please call us sooner if you are having low blood sugar episodes, or if it stays over 200.   Please come back for a follow-up appointment in 2 months.

## 2017-06-10 ENCOUNTER — Ambulatory Visit (INDEPENDENT_AMBULATORY_CARE_PROVIDER_SITE_OTHER): Payer: BC Managed Care – PPO

## 2017-06-10 ENCOUNTER — Other Ambulatory Visit: Payer: Self-pay | Admitting: Gynecology

## 2017-06-10 DIAGNOSIS — Z13820 Encounter for screening for osteoporosis: Secondary | ICD-10-CM | POA: Diagnosis not present

## 2017-06-10 DIAGNOSIS — Z01411 Encounter for gynecological examination (general) (routine) with abnormal findings: Secondary | ICD-10-CM

## 2017-06-10 DIAGNOSIS — M8588 Other specified disorders of bone density and structure, other site: Secondary | ICD-10-CM

## 2017-06-11 ENCOUNTER — Encounter: Payer: Self-pay | Admitting: Gynecology

## 2017-06-26 ENCOUNTER — Ambulatory Visit: Payer: BC Managed Care – PPO | Admitting: Internal Medicine

## 2017-06-27 ENCOUNTER — Other Ambulatory Visit: Payer: Self-pay | Admitting: Radiology

## 2017-07-16 ENCOUNTER — Other Ambulatory Visit: Payer: Self-pay | Admitting: Surgery

## 2017-07-16 DIAGNOSIS — D242 Benign neoplasm of left breast: Secondary | ICD-10-CM

## 2017-07-19 ENCOUNTER — Other Ambulatory Visit: Payer: Self-pay | Admitting: Neurology

## 2017-07-19 NOTE — Progress Notes (Addendum)
PCP: Billey Gosling, MD  Cardiologist: pt denies  EKG: 05/24/17 in EP[facility]  Stress test: 10 + years ago  ECHO: 05/24/17 and 02/2011 in EPIC  Cardiac Cath: pt denies ever  Chest x-ray: pt denies past year, no recent respiratory infections/complications  Pt's blood glucose elevated at 311-she just had a lemonade milkshake from [place] before arriving. Her fasting glucose is generally 130s. She does not know when her last Hgb A1C was or results  Pt was not told to stop her plavix.  Originally, thought she had been taking medication up until today but she actually has not had it refilled and her last dose was Thursday 07/18/17.  Called Dr. Serita Grit office to verify that the patient can stop plavix and Dr. Posey Pronto said that was fine though she has not seen patient since 2017.  She advised the patient inform Dr. Quay Burow that she had stopped plavix and would need to be restarted following procedure.   Patient has an appointment with Dr. Quay Burow today at 90 and was advised to informed her of her current situation regarding plavix and she will need to resume Plavix following surgery (and has no refills on medication) so Dr. Quay Burow needs to refer her back to Dr. Posey Pronto for follow up or assume responsibility of managing patient on Plavix.  Shelby Dubin. P-AC informed and consulted with surgery center who said Dr. Ninfa Linden told her to stop it 3 day prior.

## 2017-07-19 NOTE — Pre-Procedure Instructions (Signed)
Rebekah Kennedy  07/19/2017      [facility] [id number] [place] [address], [place] [address] [facility] [address] Phone: [phone] Fax: [phone]    Your procedure is scheduled on July 25, 2017.  Report to [facility] Admitting at 530 AM.  Call this number if you have problems the morning of surgery:  [phone]   Remember:  Do not eat food or drink liquids after midnight.  Take these medicines the morning of surgery with A SIP OF WATER metoprolol (Toprol-XL), albuterol inhaler (bring inhaler with you), amlodipine (norvasc), eye drops-if needed, montelukast (singulair).  Please finish drinking your Ensure presurgery drink before leaving the house the morning of surgery.  Stop/resume Plavix as instructed by your surgeon.  7 days prior to surgery STOP taking any Aspirin (unless otherwise instructed by your surgeon), Aleve, Naproxen, Ibuprofen, Motrin, Advil, Goody's, BC's, all herbal medications, fish oil, and all vitamins  Continue all other medications as instructed by your physician except follow the above medication instructions before surgery  WHAT DO I DO ABOUT MY DIABETES MEDICATION?  Marland Kitchen Do not take oral diabetes medicines (pills) the morning of surgery.  . THE NIGHT BEFORE SURGERY, take your normal dose of novolog insulin with dinner..      . THE MORNING OF SURGERY, take  50 units of insulin glargine and no novolog insulin unless your CBG is greater than 220 mg/dL, you may take  of your sliding scale (correction) dose of insulin.  . The day of surgery, do not take other diabetes injectables, including Byetta (exenatide), Bydureon (exenatide ER), Victoza (liraglutide), or Trulicity (dulaglutide).  . If your CBG is greater than 220 mg/dL, you may take  of your sliding scale (correction) dose of insulin.   How to Manage Your Diabetes Before and After Surgery  Why is it important to control my blood sugar before and after  surgery? . Improving blood sugar levels before and after surgery helps healing and can limit problems. . A way of improving blood sugar control is eating a healthy diet by: o  Eating less sugar and carbohydrates o  Increasing activity/exercise o  Talking with your doctor about reaching your blood sugar goals . High blood sugars (greater than 180 mg/dL) can raise your risk of infections and slow your recovery, so you will need to focus on controlling your diabetes during the weeks before surgery. . Make sure that the doctor who takes care of your diabetes knows about your planned surgery including the date and location.  How do I manage my blood sugar before surgery? . Check your blood sugar at least 4 times a day, starting 2 days before surgery, to make sure that the level is not too high or low. o Check your blood sugar the morning of your surgery when you wake up and every 2 hours until you get to the Short Stay unit. . If your blood sugar is less than 70 mg/dL, you will need to treat for low blood sugar: o Do not take insulin. o Treat a low blood sugar (less than 70 mg/dL) with  cup of clear juice (cranberry or apple), 4 glucose tablets, OR glucose gel. Recheck blood sugar in 15 minutes after treatment (to make sure it is greater than 70 mg/dL). If your blood sugar is not greater than 70 mg/dL on recheck, call [phone] o  for further instructions. . Report your blood sugar to the short stay nurse when you  get to Short Stay.  . If you are admitted to the hospital after surgery: o Your blood sugar will be checked by the staff and you will probably be given insulin after surgery (instead of oral diabetes medicines) to make sure you have good blood sugar levels. o The goal for blood sugar control after surgery is 80-180 mg/dL.  Reviewed and Endorsed by [facility] Patient Education Committee, August 2015   Do not wear jewelry, make-up or nail polish.  Do not wear lotions, powders, or  perfumes, or deodorant.  Do not shave 48 hours prior to surgery.  Men may shave face and neck.  Do not bring valuables to the hospital.   [facility] is not responsible for any belongings or valuables.  Contacts, dentures or bridgework may not be worn into surgery.  Leave your suitcase in the car.  After surgery it may be brought to your room.  For patients admitted to the hospital, discharge time will be determined by your treatment team.  Patients discharged the day of surgery will not be allowed to drive home.   Special instructions:   [place]- Preparing For Surgery  Before surgery, you can play an important role. Because skin is not sterile, your skin needs to be as free of germs as possible. You can reduce the number of germs on your skin by washing with CHG (chlorahexidine gluconate) Soap before surgery.  CHG is an antiseptic cleaner which kills germs and bonds with the skin to continue killing germs even after washing.  Please do not use if you have an allergy to CHG or antibacterial soaps. If your skin becomes reddened/irritated stop using the CHG.  Do not shave (including legs and underarms) for at least 48 hours prior to first CHG shower. It is OK to shave your face.  Please follow these instructions carefully.   1. Shower the NIGHT BEFORE SURGERY and the MORNING OF SURGERY with CHG.   2. If you chose to wash your hair, wash your hair first as usual with your normal shampoo.  3. After you shampoo, rinse your hair and body thoroughly to remove the shampoo.  4. Use CHG as you would any other liquid soap. You can apply CHG directly to the skin and wash gently with a scrungie or a clean washcloth.   5. Apply the CHG Soap to your body ONLY FROM THE NECK DOWN.  Do not use on open wounds or open sores. Avoid contact with your eyes, ears, mouth and genitals (private parts). Wash Face and genitals (private parts)  with your normal soap.  6. Wash thoroughly, paying special  attention to the area where your surgery will be performed.  7. Thoroughly rinse your body with warm water from the neck down.  8. DO NOT shower/wash with your normal soap after using and rinsing off the CHG Soap.  9. Pat yourself dry with a CLEAN TOWEL.  10. Wear CLEAN PAJAMAS to bed the night before surgery, wear comfortable clothes the morning of surgery  11. Place CLEAN SHEETS on your bed the night of your first shower and DO NOT SLEEP WITH PETS.  Day of Surgery: Do not apply any deodorants/lotions. Please wear clean clothes to the hospital/surgery center.    Please read over the following fact sheets that you were given. Pain Booklet, Coughing and Deep Breathing and Surgical Site Infection Prevention

## 2017-07-20 NOTE — Progress Notes (Signed)
Subjective:    Patient ID: Rebekah Kennedy, female    DOB: 1955-12-24, 62 y.o.   MRN: [id number]  HPI     Medications and allergies reviewed with patient and updated if appropriate.  Patient Active Problem List   Diagnosis Date Noted  . Hypertensive urgency 05/23/2017  . Greater trochanteric bursitis of both hips 12/19/2016  . OSA (obstructive sleep apnea) 07/26/2016  . Hypersomnia 06/11/2016  . Diabetes ([place]) 07/19/2015  . Irritant dermatitis 01/29/2015  . History of colonic polyps 12/01/2014  . Morbidly obese ([place]) 10/05/2013  . Partial nontraumatic tear of right rotator cuff 09/21/2013  . Goiter 12/10/2011  . Asthma 12/10/2011  . 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 TIA (transient ischemic attack) 02/13/2011  . PROTEINURIA, MILD 09/15/2009  . Hyperlipidemia 12/01/2006  . Essential hypertension 07/07/2006    Current Outpatient Medications on File Prior to Visit  Medication Sig Dispense Refill  . albuterol (PROVENTIL HFA;VENTOLIN HFA) 108 (90 BASE) MCG/ACT inhaler Inhale 2 puffs into the lungs every 4 (four) hours as needed for wheezing or shortness of breath. 18 g 2  . amLODipine (NORVASC) 10 MG tablet TAKE ONE TABLET BY MOUTH ONE TIME DAILY 90 tablet 1  . BD ULTRA-FINE PEN NEEDLES 29G X 12.7MM MISC USE 3 TIMES DAILY AS DIRECTED 100 each 1  . Biotin 5000 MCG TABS Take 1 tablet by mouth daily.    . blood glucose meter kit and supplies KIT One touch verio. Use up to four times daily as directed. (FOR ICD-9 250.00, 250.01). 1 each 0  . Carboxymethylcellul-Glycerin (LUBRICATING EYE DROPS OP) Place 1 drop into both eyes daily as needed (dry eyes).    . clopidogrel (PLAVIX) 75 MG tablet Take 1 tablet (75 mg total) by mouth daily. 90 tablet 3  . insulin aspart (NOVOLOG) 100 UNIT/ML injection 5 units three times just before meals. (Patient taking differently: Inject 5 Units into the skin 3 (three) times daily with meals. 5 units three times just before meals.) 10 mL 3  .  Insulin Glargine (BASAGLAR KWIKPEN) 100 UNIT/ML SOPN Inject 1 mL (100 Units total) into the skin every morning. And pen needles 4/day 55 pen 11  . losartan (COZAAR) 100 MG tablet TAKE 1 TAB BY MOUTH ONCE DAILY. 90 tablet 1  . metoprolol succinate (TOPROL-XL) 100 MG 24 hr tablet TAKE 1 TABLET BY MOUTH DAILY WITH OR IMMEDIATELY FOLLOWING A MEAL 90 tablet 1  . montelukast (SINGULAIR) 10 MG tablet Take 1 tablet (10 mg total) by mouth at bedtime. (Patient taking differently: Take 10 mg by mouth daily. ) 90 tablet 3  . Multiple Vitamin (MULTIVITAMIN) tablet Take 1 tablet by mouth daily.      . pravastatin (PRAVACHOL) 20 MG tablet Take 1 tablet (20 mg total) by mouth daily. 90 tablet 3  . spironolactone (ALDACTONE) 25 MG tablet Take 2 tablets (50 mg total) by mouth daily. 90 tablet 1   No current facility-administered medications on file prior to visit.     Past Medical History:  Diagnosis Date  . ASTHMA NOS W/ACUTE EXACERBATION 07/10/2010  . Cataract   . Cervical dysplasia   . CHEST PAIN 01/23/2007  . DEPRESSION 03/16/2007  . DIABETES MELLITUS, TYPE II 12/01/2006  . Dysmenorrhea   . Fibroid   . HYPERLIPIDEMIA 12/01/2006  . HYPERTENSION 07/07/2006  . Obesity   . Osteopenia 05/2017   T score -1.5 FRAX 2.6% / 0.1%  . Splenomegaly    in college  . Stroke (  [place]) 2012   R pontine, mild residual left hemiparesis  . TIA (transient ischemic attack) 2012    Past Surgical History:  Procedure Laterality Date  . 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 APPENDECTOMY    . BIOPSY THYROID  05/02/11   Nonneoplastic goiter  . BREAST BIOPSY    . BREAST SURGERY     Reduction  . COLPOSCOPY    . DILATION AND CURETTAGE OF UTERUS  1975   DUB  . EYE SURGERY     Laser  . GYNECOLOGIC CRYOSURGERY    . MYOMECTOMY    . OVARIAN CYST REMOVAL    . PELVIC LAPAROSCOPY  75,88   DL lysis of adhesions    Social History   Socioeconomic History  . Marital status: Single    Spouse name: Not on file  . Number of  children: 2  . Years of education: Not on file  . Highest education level: Not on file  Social Needs  . Financial resource strain: Not on file  . Food insecurity - worry: Not on file  . Food insecurity - inability: Not on file  . Transportation needs - medical: Not on file  . Transportation needs - non-medical: Not on file  Occupational History  . Occupation: Product manager: [employer]  Tobacco Use  . Smoking status: Never Smoker  . Smokeless tobacco: Never Used  Substance and Sexual Activity  . Alcohol use: Yes    Alcohol/week: 0.0 oz    Comment: very rare  . Drug use: No  . Sexual activity: Not Currently    Birth control/protection: Post-menopausal    Comment: 1st intercourse 62 yo-Fewer than 5 partners  Other Topics Concern  . 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 Cancer Maternal Grandmother        Colon Cancer  . Asthma Maternal Grandmother   . Diabetes Father   . Heart disease Father   . Hypertension Father   . Hyperlipidemia Father   . Hypertension Mother   . Heart disease Mother   . Ovarian cancer Mother   . Cancer Mother        Lung cancer  . Asthma Mother   . COPD Mother   . Hyperlipidemia Mother   . Diabetes Brother   . Hypertension Brother   . Kidney disease Brother   . Asthma Brother   . Heart disease Brother   . Hyperlipidemia Brother   . Cancer Brother        Prostate  . Graves' disease Sister   . Diabetes Sister   . Breast cancer Sister 60  . Graves' disease Paternal Grandmother   . Hypertension Paternal Grandmother   . Heart disease Paternal Grandmother   . Alzheimer's disease Paternal Grandmother   . Cancer Maternal Grandfather   . Heart failure Brother   . Heart failure Brother     Review of Systems     Objective:  There were no vitals filed for this visit. There were  no vitals filed for this visit. There is no height or weight on file to calculate BMI.  Wt Readings from Last 3 Encounters:  05/31/17 246 lb (111.6 kg)  05/25/17 248 lb (112.5 kg)  04/10/17 252 lb (114.3 kg)     Physical Exam  Assessment & Plan:       This encounter was created in error - please disregard.

## 2017-07-20 NOTE — Patient Instructions (Signed)
Test(s) ordered today. Your results will be released to [place] (or called to you) after review, usually within 72hours after test completion. If any changes need to be made, you will be notified at that same time.  All other Health Maintenance issues reviewed.   All recommended immunizations and age-appropriate screenings are up-to-date or discussed.  No immunizations administered today.   Medications reviewed and updated.  Changes include  /  No changes recommended at this time.  Your prescription(s) have been submitted to your pharmacy. Please take as directed and contact our office if you believe you are having problem(s) with the medication(s).  A referral was ordered for   Please followup in    Health Maintenance, Female Adopting a healthy lifestyle and getting preventive care can go a long way to promote health and wellness. Talk with your health care provider about what schedule of regular examinations is right for you. This is a good chance for you to check in with your provider about disease prevention and staying healthy. In between checkups, there are plenty of things you can do on your own. Experts have done a lot of research about which lifestyle changes and preventive measures are most likely to keep you healthy. Ask your health care provider for more information. Weight and diet Eat a healthy diet  Be sure to include plenty of vegetables, fruits, low-fat dairy products, and lean protein.  Do not eat a lot of foods high in solid fats, added sugars, or salt.  Get regular exercise. This is one of the most important things you can do for your health. ? Most adults should exercise for at least 150 minutes each week. The exercise should increase your heart rate and make you sweat (moderate-intensity exercise). ? Most adults should also do strengthening exercises at least twice a week. This is in addition to the moderate-intensity exercise.  Maintain a healthy weight  Body  mass index (BMI) is a measurement that can be used to identify possible weight problems. It estimates body fat based on height and weight. Your health care provider can help determine your BMI and help you achieve or maintain a healthy weight.  For females 80 years of age and older: ? A BMI below 18.5 is considered underweight. ? A BMI of 18.5 to 24.9 is normal. ? A BMI of 25 to 29.9 is considered overweight. ? A BMI of 30 and above is considered obese.  Watch levels of cholesterol and blood lipids  You should start having your blood tested for lipids and cholesterol at 61 years of age, then have this test every 5 years.  You may need to have your cholesterol levels checked more often if: ? Your lipid or cholesterol levels are high. ? You are older than 61 years of age. ? You are at high risk for heart disease.  Cancer screening Lung Cancer  Lung cancer screening is recommended for adults 61-48 years old who are at high risk for lung cancer because of a history of smoking.  A yearly low-dose CT scan of the lungs is recommended for people who: ? Currently smoke. ? Have quit within the past 15 years. ? Have at least a 30-pack-year history of smoking. A pack year is smoking an average of one pack of cigarettes a day for 1 year.  Yearly screening should continue until it has been 15 years since you quit.  Yearly screening should stop if you develop a health problem that would prevent you from having  lung cancer treatment.  Breast Cancer  Practice breast self-awareness. This means understanding how your breasts normally appear and feel.  It also means doing regular breast self-exams. Let your health care provider know about any changes, no matter how small.  If you are in your 20s or 30s, you should have a clinical breast exam (CBE) by a health care provider every 1-3 years as part of a regular health exam.  If you are 40 or older, have a CBE every year. Also consider having a  breast X-ray (mammogram) every year.  If you have a family history of breast cancer, talk to your health care provider about genetic screening.  If you are at high risk for breast cancer, talk to your health care provider about having an MRI and a mammogram every year.  Breast cancer gene (BRCA) assessment is recommended for women who have family members with BRCA-related cancers. BRCA-related cancers include: ? Breast. ? Ovarian. ? Tubal. ? Peritoneal cancers.  Results of the assessment will determine the need for genetic counseling and BRCA1 and BRCA2 testing.  Cervical Cancer Your health care provider may recommend that you be screened regularly for cancer of the pelvic organs (ovaries, uterus, and vagina). This screening involves a pelvic examination, including checking for microscopic changes to the surface of your cervix (Pap test). You may be encouraged to have this screening done every 3 years, beginning at age 21.  For women ages 30-65, health care providers may recommend pelvic exams and Pap testing every 3 years, or they may recommend the Pap and pelvic exam, combined with testing for human papilloma virus (HPV), every 5 years. Some types of HPV increase your risk of cervical cancer. Testing for HPV may also be done on women of any age with unclear Pap test results.  Other health care providers may not recommend any screening for nonpregnant women who are considered low risk for pelvic cancer and who do not have symptoms. Ask your health care provider if a screening pelvic exam is right for you.  If you have had past treatment for cervical cancer or a condition that could lead to cancer, you need Pap tests and screening for cancer for at least 20 years after your treatment. If Pap tests have been discontinued, your risk factors (such as having a new sexual partner) need to be reassessed to determine if screening should resume. Some women have medical problems that increase the chance  of getting cervical cancer. In these cases, your health care provider may recommend more frequent screening and Pap tests.  Colorectal Cancer  This type of cancer can be detected and often prevented.  Routine colorectal cancer screening usually begins at 61 years of age and continues through 61 years of age.  Your health care provider may recommend screening at an earlier age if you have risk factors for colon cancer.  Your health care provider may also recommend using home test kits to check for hidden blood in the stool.  A small camera at the end of a tube can be used to examine your colon directly (sigmoidoscopy or colonoscopy). This is done to check for the earliest forms of colorectal cancer.  Routine screening usually begins at age 50.  Direct examination of the colon should be repeated every 5-10 years through 61 years of age. However, you may need to be screened more often if early forms of precancerous polyps or small growths are found.  Skin Cancer  Check your skin from head to   toe regularly.  Tell your health care provider about any new moles or changes in moles, especially if there is a change in a mole's shape or color.  Also tell your health care provider if you have a mole that is larger than the size of a pencil eraser.  Always use sunscreen. Apply sunscreen liberally and repeatedly throughout the day.  Protect yourself by wearing long sleeves, pants, a wide-brimmed hat, and sunglasses whenever you are outside.  Heart disease, diabetes, and high blood pressure  High blood pressure causes heart disease and increases the risk of stroke. High blood pressure is more likely to develop in: ? People who have blood pressure in the high end of the normal range (130-139/85-89 mm Hg). ? People who are overweight or obese. ? People who are African American.  If you are 12-70 years of age, have your blood pressure checked every 3-5 years. If you are 37 years of age or older,  have your blood pressure checked every year. You should have your blood pressure measured twice-once when you are at a hospital or clinic, and once when you are not at a hospital or clinic. Record the average of the two measurements. To check your blood pressure when you are not at a hospital or clinic, you can use: ? An automated blood pressure machine at a pharmacy. ? A home blood pressure monitor.  If you are between 56 years and 70 years old, ask your health care provider if you should take aspirin to prevent strokes.  Have regular diabetes screenings. This involves taking a blood sample to check your fasting blood sugar level. ? If you are at a normal weight and have a low risk for diabetes, have this test once every three years after 61 years of age. ? If you are overweight and have a high risk for diabetes, consider being tested at a younger age or more often. Preventing infection Hepatitis B  If you have a higher risk for hepatitis B, you should be screened for this virus. You are considered at high risk for hepatitis B if: ? You were born in a country where hepatitis B is common. Ask your health care provider which countries are considered high risk. ? Your parents were born in a high-risk country, and you have not been immunized against hepatitis B (hepatitis B vaccine). ? You have HIV or AIDS. ? You use needles to inject street drugs. ? You live with someone who has hepatitis B. ? You have had sex with someone who has hepatitis B. ? You get hemodialysis treatment. ? You take certain medicines for conditions, including cancer, organ transplantation, and autoimmune conditions.  Hepatitis C  Blood testing is recommended for: ? Everyone born from [birth year] through 1964. ? Anyone with known risk factors for hepatitis C.  Sexually transmitted infections (STIs)  You should be screened for sexually transmitted infections (STIs) including gonorrhea and chlamydia if: ? You are sexually  active and are younger than 61 years of age. ? You are older than 61 years of age and your health care provider tells you that you are at risk for this type of infection. ? Your sexual activity has changed since you were last screened and you are at an increased risk for chlamydia or gonorrhea. Ask your health care provider if you are at risk.  If you do not have HIV, but are at risk, it may be recommended that you take a prescription medicine daily to prevent HIV infection.  This is called pre-exposure prophylaxis (PrEP). You are considered at risk if: ? You are sexually active and do not regularly use condoms or know the HIV status of your partner(s). ? You take drugs by injection. ? You are sexually active with a partner who has HIV.  Talk with your health care provider about whether you are at high risk of being infected with HIV. If you choose to begin PrEP, you should first be tested for HIV. You should then be tested every 3 months for as long as you are taking PrEP. Pregnancy  If you are premenopausal and you may become pregnant, ask your health care provider about preconception counseling.  If you may become pregnant, take 400 to 800 micrograms (mcg) of folic acid every day.  If you want to prevent pregnancy, talk to your health care provider about birth control (contraception). Osteoporosis and menopause  Osteoporosis is a disease in which the bones lose minerals and strength with aging. This can result in serious bone fractures. Your risk for osteoporosis can be identified using a bone density scan.  If you are 73 years of age or older, or if you are at risk for osteoporosis and fractures, ask your health care provider if you should be screened.  Ask your health care provider whether you should take a calcium or vitamin D supplement to lower your risk for osteoporosis.  Menopause may have certain physical symptoms and risks.  Hormone replacement therapy may reduce some of these  symptoms and risks. Talk to your health care provider about whether hormone replacement therapy is right for you. Follow these instructions at home:  Schedule regular health, dental, and eye exams.  Stay current with your immunizations.  Do not use any tobacco products including cigarettes, chewing tobacco, or electronic cigarettes.  If you are pregnant, do not drink alcohol.  If you are breastfeeding, limit how much and how often you drink alcohol.  Limit alcohol intake to no more than 1 drink per day for nonpregnant women. One drink equals 12 ounces of beer, 5 ounces of wine, or 1 ounces of hard liquor.  Do not use street drugs.  Do not share needles.  Ask your health care provider for help if you need support or information about quitting drugs.  Tell your health care provider if you often feel depressed.  Tell your health care provider if you have ever been abused or do not feel safe at home. This information is not intended to replace advice given to you by your health care provider. Make sure you discuss any questions you have with your health care provider. Document Released: 11/20/2010 Document Revised: 10/13/2015 Document Reviewed: 02/08/2015 Elsevier Interactive Patient Education  [employer].

## 2017-07-22 ENCOUNTER — Other Ambulatory Visit: Payer: Self-pay

## 2017-07-22 ENCOUNTER — Encounter (HOSPITAL_COMMUNITY): Payer: Self-pay

## 2017-07-22 ENCOUNTER — Encounter: Payer: BC Managed Care – PPO | Admitting: Internal Medicine

## 2017-07-22 ENCOUNTER — Encounter (HOSPITAL_COMMUNITY)
Admission: RE | Admit: 2017-07-22 | Discharge: 2017-07-22 | Disposition: A | Discharge status: Home / Self Care | Payer: BC Managed Care – PPO | Source: Ambulatory Visit | Attending: Orthopaedic Surgery | Admitting: Orthopaedic Surgery

## 2017-07-22 ENCOUNTER — Telehealth: Payer: Self-pay | Admitting: Internal Medicine

## 2017-07-22 DIAGNOSIS — Z6841 Body Mass Index (BMI) 40.0 and over, adult: Secondary | ICD-10-CM | POA: Diagnosis not present

## 2017-07-22 DIAGNOSIS — D242 Benign neoplasm of left breast: Secondary | ICD-10-CM | POA: Diagnosis present

## 2017-07-22 DIAGNOSIS — Z803 Family history of malignant neoplasm of breast: Secondary | ICD-10-CM | POA: Diagnosis not present

## 2017-07-22 DIAGNOSIS — Z794 Long term (current) use of insulin: Secondary | ICD-10-CM | POA: Diagnosis not present

## 2017-07-22 DIAGNOSIS — Z8041 Family history of malignant neoplasm of ovary: Secondary | ICD-10-CM | POA: Diagnosis not present

## 2017-07-22 DIAGNOSIS — E118 Type 2 diabetes mellitus with unspecified complications: Secondary | ICD-10-CM | POA: Diagnosis not present

## 2017-07-22 DIAGNOSIS — Z79899 Other long term (current) drug therapy: Secondary | ICD-10-CM | POA: Diagnosis not present

## 2017-07-22 DIAGNOSIS — I10 Essential (primary) hypertension: Secondary | ICD-10-CM | POA: Diagnosis not present

## 2017-07-22 HISTORY — DX: Sleep apnea, unspecified: G47.30

## 2017-07-22 LAB — CBC
HCT: 40.3 % (ref 36.0–46.0)
HEMOGLOBIN: 12.8 g/dL (ref 12.0–15.0)
MCH: 27.1 pg (ref 26.0–34.0)
MCHC: 31.8 g/dL (ref 30.0–36.0)
MCV: 85.2 fL (ref 78.0–100.0)
Platelets: 276 10*3/uL (ref 150–400)
RBC: 4.73 MIL/uL (ref 3.87–5.11)
RDW: 14 % (ref 11.5–15.5)
WBC: 9.1 10*3/uL (ref 4.0–10.5)

## 2017-07-22 LAB — HEMOGLOBIN A1C
Hgb A1c MFr Bld: 9.6 % — ABNORMAL HIGH (ref 4.8–5.6)
MEAN PLASMA GLUCOSE: 228.82 mg/dL

## 2017-07-22 LAB — BASIC METABOLIC PANEL
ANION GAP: 14 (ref 5–15)
BUN: 13 mg/dL (ref 6–20)
CO2: 25 mmol/L (ref 22–32)
Calcium: 9.1 mg/dL (ref 8.9–10.3)
Chloride: 100 mmol/L — ABNORMAL LOW (ref 101–111)
Creatinine, Ser: 0.81 mg/dL (ref 0.44–1.00)
GLUCOSE: 333 mg/dL — AB (ref 65–99)
POTASSIUM: 4.1 mmol/L (ref 3.5–5.1)
SODIUM: 139 mmol/L (ref 135–145)

## 2017-07-22 LAB — GLUCOSE, CAPILLARY: Glucose-Capillary: 311 mg/dL — ABNORMAL HIGH (ref 65–99)

## 2017-07-22 LAB — PROTIME-INR
INR: 0.98
PROTHROMBIN TIME: 12.9 s (ref 11.4–15.2)

## 2017-07-22 NOTE — Telephone Encounter (Signed)
error 

## 2017-07-22 NOTE — Progress Notes (Signed)
Subjective:    Patient ID: Rebekah Kennedy, female    DOB: 14-Apr-1956, 62 y.o.   MRN: [id number]  HPI She is here for a physical exam.   She is having left breast lumpectomy in two days and will have gastric sleeve on 3/25.    She has no concerns.  She needs some refills.  She has been wheezing some from asthma.  She denies cold symptoms.  She needs her albuterol inhaler refilled and also needs a prescription for the nebulizer solution.  Medications and allergies reviewed with patient and updated if appropriate.  Patient Active Problem List   Diagnosis Date Noted  . Hypertensive urgency 05/23/2017  . Greater trochanteric bursitis of both hips 12/19/2016  . OSA (obstructive sleep apnea) 07/26/2016  . Hypersomnia 06/11/2016  . Diabetes ([place]) 07/19/2015  . History of colonic polyps 12/01/2014  . Morbidly obese ([place]) 10/05/2013  . Partial nontraumatic tear of right rotator cuff 09/21/2013  . Goiter 12/10/2011  . Asthma 12/10/2011  . 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 TIA (transient ischemic attack) 02/13/2011  . PROTEINURIA, MILD 09/15/2009  . Hyperlipidemia 12/01/2006  . Essential hypertension 07/07/2006    Current Outpatient Medications on File Prior to Visit  Medication Sig Dispense Refill  . amLODipine (NORVASC) 10 MG tablet TAKE ONE TABLET BY MOUTH ONE TIME DAILY 90 tablet 1  . BD ULTRA-FINE PEN NEEDLES 29G X 12.7MM MISC USE 3 TIMES DAILY AS DIRECTED 100 each 1  . Biotin 5000 MCG TABS Take 1 tablet by mouth daily.    . blood glucose meter kit and supplies KIT One touch verio. Use up to four times daily as directed. (FOR ICD-9 250.00, 250.01). 1 each 0  . Carboxymethylcellul-Glycerin (LUBRICATING EYE DROPS OP) Place 1 drop into both eyes daily as needed (dry eyes).    . insulin aspart (NOVOLOG) 100 UNIT/ML injection 5 units three times just before meals. (Patient taking differently: Inject 5 Units into the skin 3 (three) times daily with meals. 5 units three times  just before meals.) 10 mL 3  . Insulin Glargine (BASAGLAR KWIKPEN) 100 UNIT/ML SOPN Inject 1 mL (100 Units total) into the skin every morning. And pen needles 4/day 55 pen 11  . losartan (COZAAR) 100 MG tablet TAKE 1 TAB BY MOUTH ONCE DAILY. 90 tablet 1  . metoprolol succinate (TOPROL-XL) 100 MG 24 hr tablet TAKE 1 TABLET BY MOUTH DAILY WITH OR IMMEDIATELY FOLLOWING A MEAL 90 tablet 1  . montelukast (SINGULAIR) 10 MG tablet Take 1 tablet (10 mg total) by mouth at bedtime. (Patient taking differently: Take 10 mg by mouth daily. ) 90 tablet 3  . Multiple Vitamin (MULTIVITAMIN) tablet Take 1 tablet by mouth daily.      . pravastatin (PRAVACHOL) 20 MG tablet Take 1 tablet (20 mg total) by mouth daily. 90 tablet 3  . spironolactone (ALDACTONE) 25 MG tablet Take 2 tablets (50 mg total) by mouth daily. 90 tablet 1   No current facility-administered medications on file prior to visit.     Past Medical History:  Diagnosis Date  . ASTHMA NOS W/ACUTE EXACERBATION 07/10/2010  . Cataract   . Cervical dysplasia   . CHEST PAIN 01/23/2007  . DEPRESSION 03/16/2007  . DIABETES MELLITUS, TYPE II 12/01/2006  . Dysmenorrhea   . Fibroid   . HYPERLIPIDEMIA 12/01/2006  . HYPERTENSION 07/07/2006  . Obesity   . Osteopenia 05/2017   T score -1.5 FRAX 2.6% / 0.1%  . Sleep apnea   .  Splenomegaly    in college  . Stroke [facility]) 2012   R pontine, mild residual left hemiparesis  . TIA (transient ischemic attack) 2012    Past Surgical History:  Procedure Laterality Date  . 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 APPENDECTOMY    . BIOPSY THYROID  05/02/11   Nonneoplastic goiter  . BREAST BIOPSY    . BREAST SURGERY     Reduction  . COLPOSCOPY    . DILATION AND CURETTAGE OF UTERUS  1975   DUB  . EYE SURGERY     Laser  . GYNECOLOGIC CRYOSURGERY    . MYOMECTOMY    . OVARIAN CYST REMOVAL    . PELVIC LAPAROSCOPY  75,88   DL lysis of adhesions    Social History   Socioeconomic History  . Marital status: Single      Spouse name: None  . Number of children: 2  . Years of education: None  . Highest education level: None  Social Needs  . Financial resource strain: None  . Food insecurity - worry: None  . Food insecurity - inability: None  . Transportation needs - medical: None  . Transportation needs - non-medical: None  Occupational History  . Occupation: Product manager: [employer]  Tobacco Use  . Smoking status: Never Smoker  . Smokeless tobacco: Never Used  Substance and Sexual Activity  . Alcohol use: Yes    Alcohol/week: 0.0 oz    Comment: very rare  . Drug use: No  . Sexual activity: Not Currently    Birth control/protection: Post-menopausal    Comment: 1st intercourse 62 yo-Fewer than 5 partners  Other Topics Concern  . 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 Cancer Maternal Grandmother        Colon Cancer  . Asthma Maternal Grandmother   . Diabetes Father   . Heart disease Father   . Hypertension Father   . Hyperlipidemia Father   . Hypertension Mother   . Heart disease Mother   . Ovarian cancer Mother   . Cancer Mother        Lung cancer  . Asthma Mother   . COPD Mother   . Hyperlipidemia Mother   . Diabetes Brother   . Hypertension Brother   . Kidney disease Brother   . Asthma Brother   . Heart disease Brother   . Hyperlipidemia Brother   . Cancer Brother        Prostate  . Graves' disease Sister   . Diabetes Sister   . Breast cancer Sister 78  . Graves' disease Paternal Grandmother   . Hypertension Paternal Grandmother   . Heart disease Paternal Grandmother   . Alzheimer's disease Paternal Grandmother   . Cancer Maternal Grandfather   . Heart failure Brother   . Heart failure Brother     Review of Systems  Constitutional: Negative for chills and fever.  Eyes: Negative for visual disturbance.   Respiratory: Positive for shortness of breath (asthma flare) and wheezing (asthma flare). Negative for cough.   Cardiovascular: Positive for leg swelling (occ). Negative for chest pain and palpitations.  Gastrointestinal: Negative for abdominal pain, blood in stool, constipation, diarrhea and nausea.       No gerd  Genitourinary: Negative for dysuria and hematuria.  Musculoskeletal: Negative for arthralgias and back pain.  Skin: Negative for color change and rash.  Neurological: Negative for light-headedness and headaches (occ).  Psychiatric/Behavioral: Negative for dysphoric mood. The patient is not nervous/anxious.        Objective:   Vitals:   07/23/17 1311  BP: 130/74  Pulse: 85  Resp: 16  Temp: 98.4 F (36.9 C)  SpO2: 95%   Filed Weights   07/23/17 1311  Weight: 253 lb (114.8 kg)   Body mass index is 40.84 kg/m.  Wt Readings from Last 3 Encounters:  07/23/17 253 lb (114.8 kg)  07/22/17 254 lb 9.6 oz (115.5 kg)  05/31/17 246 lb (111.6 kg)     Physical Exam Constitutional: She appears well-developed and well-nourished. No distress.  HENT:  Head: Normocephalic and atraumatic.  Right Ear: External ear normal. Normal ear canal and TM Left Ear: External ear normal.  Normal ear canal and TM Mouth/Throat: Oropharynx is clear and moist.  Eyes: Conjunctivae and EOM are normal.  Neck: Neck supple. No tracheal deviation present. No thyromegaly present.  No carotid bruit  Cardiovascular: Normal rate, regular rhythm and normal heart sounds.   No murmur heard.  No edema. Pulmonary/Chest: Effort normal. No respiratory distress. Mild expiratory wheezes. She has no rales.  Breast: deferred to Gyn Abdominal: Soft. She exhibits no distension. There is no tenderness.  Lymphadenopathy: She has no cervical adenopathy.  Skin: Skin is warm and dry. She is not diaphoretic.  Psychiatric: She has a normal mood and affect. Her behavior is normal.        Assessment & Plan:    Physical exam: Screening blood work  ordered Immunizations  Flu vaccine up todate, discussed shingrix, others up to date Colonoscopy   Up to date  Mammogram   Up to date  Gyn   Up to date  - dr Phineas Real Dexa   Done 05/2017   - managed by Dr Phineas Real Eye exams    Up to date  EKG   Done 05/2017  Exercise  Doing minimal exercise Weight - will have gastric sleeve 08/12/17 Skin   no concerns Substance abuse   none  See Problem List for Assessment and Plan of chronic medical problems.    Follow-up in 6 months, sooner if needed after surgery to adjust medication, but will communicate via my chart.  Discussed that most of her blood pressure medication will need to be stopped after surgery

## 2017-07-22 NOTE — Progress Notes (Addendum)
Anesthesia Chart Review: Patient is a 62 year old female scheduled for left breast lumpectomy with radioactive seed localization x2 on 07/25/17 by Dr. Coralie Keens. Radioactive seed localization is scheduled for 07/23/17. She is also scheduled for laparoscopic Roux-en-Y gastric bypass on 08/12/17 by Dr. Gurney Maxin.    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 BMI is consistent with morbid obesity.  - Admission 05/23/17 - 05/25/17 for dizziness, hypertensive urgency (BP 207/110). Troponin 0.03-0.05. Echo showed normal LVEF and wall motion, grade II diastolic dysfunction. MRI of the brain showed no acute findings. Dizziness felt related to hypertension.   - PCP is Dr. Billey Gosling. Last visit 07/22/17 for routine physical (note not yet completed). - Endocrinologist is Dr. Renato Shin, last visit 05/31/17. Next appointment is scheduled for 07/29/17.  - Pulmonologist is Dr. Baird Lyons (OSA). She was also seen by Dr. Retia Passe on 10/10/16 for preoperative pulmonology evaluation prior to bariatric surgery. He felt she was at increased risk (but no absolute contraindications) for pulmonary complication given OSA and asthma, but patient willing to accept those risk. Patient was compliant with CPAP and asthma controlled at that time.  - Neurologist is Dr. Narda Amber. Last visit 12/16/15. Patient on Plavix for secondary stroke prevention.   Meds include albuterol, amlodipine, Plavix, NovoLog 5 units before meals, Lantus 100 units every morning, losartan, Toprol-XL, Singulair, pravastatin, Aldactone. She reported not being instructed when to hold Plavix for surgery, but had stopped it after her 07/18/17 (due to not being able to get from pharmacy due to need for refill). PAT RN Raquel Sarna spoke  with Dr. Serita Grit staff. Dr. Posey Pronto reportedly gave okay to hold Plavix for surgery with instructions to resume post-operatively, but recommended that patient either schedule another neurology office visit or either talk with Dr. Quay Burow about managing her Plavix. (Per Colletta [place] at [place], Dr. Ninfa Linden requested patient hold Plavix for three days prior to surgery. I notified her that patient had already stopped after 07/18/17 dose.)   BP (!) 167/70   Pulse 89   Temp 36.7 C   Resp 20   Ht 5\' 6"  (1.676 m)   Wt 254 lb 9.6 oz (115.5 kg)   SpO2 99%   BMI 41.09 kg/m   EKG 05/23/17: SR, consider left atrial enlargement, anterior infarct (old). Isolated inferior Q waves in lead III.  Echo 05/24/17: Study Conclusions - Left ventricle: The cavity size was normal. Wall thickness was   normal. Systolic function was normal. The estimated ejection   fraction was in the range of 60% to 65%. Wall motion was normal;   there were no regional wall motion abnormalities. Features are   consistent with a pseudonormal left ventricular filling pattern,   with concomitant abnormal relaxation and increased filling   pressure (grade 2 diastolic dysfunction). - Mitral valve: Mildly thickened leaflets.  She reported a stress test 10+ years ago. (She was seen by Dr. Minus Breeding in 01/2007 and stress test was ordered, bo this may be when stress test was done. There is not stress test results seen in Epic.)  Carotid U/S 05/24/17: Final Interpretation: - Right Carotid: There is evidence in the right ICA of a 1-39% stenosis. - Left Carotid: There is evidence in the left ICA of a 1-39% stenosis. - Vertebrals: Both vertebral arteries were patent with antegrade flow.  MRI brain CT 05/23/17: IMPRESSION: 1. No acute intracranial abnormality. 2. Few small remote right pontine lacunar infarcts. 3. Minimal chronic small vessel ischemic changes for age.  Preoperative labs noted. INR 0.98. A1c 9.6 (up from 8.5 on 05/31/17). CBC WNL. Cr  0.81. Glucose (non-fasting) 333 (reported having milk shake prior to appointment). She reports fasting CBG in the 130's. A1c result called to [facility] nurse Sunday Spillers to she can alert both Dr. Ninfa Linden and Dr. Haig Prophet will also route results to them in Epic.   Patient denied any acute issues at PAT. She was hyperglycemic and A1c has increased over the past two months. Her reported fasting glucose levels are reasonable, so would anticipate that if her fasting CBG on the day of surgery is okay and otherwise no acute changes that she can proceed as planned. Dr. Kieth Brightly, however, may want her DM better controlled prior to bariatric surgery scheduled for later this month. Above reviewed with anesthesiologist Dr. Lillia Abed. (Update 07/23/17 9:18 AM: Dr. Kieth Brightly responded that he was okay still proceeding with bariatric surgery later this month. I will route A1c results to Dr. Quay Burow and Dr. Loanne Drilling. She has an upcoming endocrinology appointment.)  Myra Gianotti, PA-C [facility]/Anesthesiology Phone [phone] 07/22/2017 5:08 PM

## 2017-07-23 ENCOUNTER — Encounter: Payer: Self-pay | Admitting: Internal Medicine

## 2017-07-23 ENCOUNTER — Ambulatory Visit (INDEPENDENT_AMBULATORY_CARE_PROVIDER_SITE_OTHER): Payer: BC Managed Care – PPO | Admitting: Internal Medicine

## 2017-07-23 VITALS — BP 130/74 | HR 85 | Temp 98.4°F | Resp 16 | Wt 253.0 lb

## 2017-07-23 DIAGNOSIS — E7849 Other hyperlipidemia: Secondary | ICD-10-CM

## 2017-07-23 DIAGNOSIS — E1151 Type 2 diabetes mellitus with diabetic peripheral angiopathy without gangrene: Secondary | ICD-10-CM

## 2017-07-23 DIAGNOSIS — J45909 Unspecified asthma, uncomplicated: Secondary | ICD-10-CM | POA: Diagnosis not present

## 2017-07-23 DIAGNOSIS — G459 Transient cerebral ischemic attack, unspecified: Secondary | ICD-10-CM

## 2017-07-23 DIAGNOSIS — Z0001 Encounter for general adult medical examination with abnormal findings: Secondary | ICD-10-CM

## 2017-07-23 DIAGNOSIS — Z794 Long term (current) use of insulin: Secondary | ICD-10-CM | POA: Diagnosis not present

## 2017-07-23 DIAGNOSIS — I10 Essential (primary) hypertension: Secondary | ICD-10-CM | POA: Diagnosis not present

## 2017-07-23 MED ORDER — CLOPIDOGREL BISULFATE 75 MG PO TABS: 75.0000 mg | ORAL_TABLET | Freq: Every day | ORAL | 3 refills | Status: DC

## 2017-07-23 MED ORDER — ALBUTEROL SULFATE HFA 108 (90 BASE) MCG/ACT IN AERS: 2.0000 | INHALATION_SPRAY | RESPIRATORY_TRACT | 2 refills | Status: AC | PRN

## 2017-07-23 MED ORDER — ALBUTEROL SULFATE (2.5 MG/3ML) 0.083% IN NEBU: 2.5000 mg | INHALATION_SOLUTION | Freq: Four times a day (QID) | RESPIRATORY_TRACT | 1 refills | Status: DC | PRN

## 2017-07-23 NOTE — Assessment & Plan Note (Signed)
Recent A1c elevated compared to last one has upcoming appointment with Dr. Loanne Drilling Plans on undergoing gastric sleeve 3/25

## 2017-07-23 NOTE — Assessment & Plan Note (Signed)
Undergoing gastric sleeve later this month

## 2017-07-23 NOTE — Assessment & Plan Note (Signed)
Continue daily statin Regular exercise and healthy diet encouraged  

## 2017-07-23 NOTE — Assessment & Plan Note (Signed)
On plavix No longer following with neurology Will continue plavix - I will prescribe

## 2017-07-23 NOTE — Patient Instructions (Addendum)
   All other Health Maintenance issues reviewed.   All recommended immunizations and age-appropriate screenings are up-to-date or discussed.  No immunizations administered today.   Medications reviewed and updated.  /  No changes recommended at this time.  Your prescription(s) have been submitted to your pharmacy. Please take as directed and contact our office if you believe you are having problem(s) with the medication(s).   Please followup in 6 months   

## 2017-07-23 NOTE — Assessment & Plan Note (Signed)
Mild wheezing on exam No evidence of infection-likely flare from weather changes, allergies Needs inhaler and neb  solution refilled-sent to pharmacy today.  She will start using consistently If wheezing does not improve she will let me know

## 2017-07-23 NOTE — Assessment & Plan Note (Signed)
BP well controlled Current regimen effective and well tolerated Continue current medications at current doses cmp  

## 2017-07-24 ENCOUNTER — Encounter (HOSPITAL_COMMUNITY): Payer: Self-pay | Admitting: Anesthesiology

## 2017-07-24 NOTE — H&P (Signed)
Rebekah Kennedy Documented: 07/16/2017 3:52 PM Location: [facility] Patient #: [id number] DOB: [birth year] Single / Language: Rebekah Kennedy / Race: Black or African American Female   History of Present Illness (Rebekah Kennedy A. Ninfa Linden MD; 07/16/2017 4:01 PM) The patient is a 62 year old female who presents with a complaint of Breast problems. She is referred here by Dr. Christene Kennedy after the recent diagnosis of 2 separate papilloma left breast. These were found on screening mammography and confirmed with stereotactic biopsies. She has a significant family history of a sister who had breast cancer at 67 and a mother who had ovarian cancer. She has had previous breast reduction herself. She's had no other problems with her breast and denies nipple discharge. She is otherwise without complaints.   Allergies (Rebekah Kennedy, Rebekah Kennedy; 07/16/2017 3:52 PM) No Known Drug Allergies [04/05/2016]: Allergies Reconciled   Medication History (Rebekah Kennedy, Rebekah Kennedy; 07/16/2017 3:52 PM) Protonix (40MG  Tablet DR, 1 (one) Oral daily, Taken starting 05/02/2017) Active. Zofran (4MG  Tablet, 1 (one) Oral every eight hours, as needed, Taken starting 05/02/2017) Active. TraMADol HCl (50MG  Tablet, 1 (one) Oral every six hours, as needed, Taken starting 05/03/2017) Active. AmLODIPine Besylate (10MG  Tablet, Oral) Active. Clopidogrel Bisulfate (75MG  Tablet, Oral) Active. Basaglar KwikPen (100UNIT/ML Soln Pen-inj, Subcutaneous) Active. Montelukast Sodium (10MG  Tablet, Oral) Active. Metoprolol Succinate ER (100MG  Tablet ER 24HR, Oral) Active. Losartan Potassium (100MG  Tablet, Oral) Active. Spironolactone (25MG  Tablet, Oral) Active. Medications Reconciled  Vitals (Rebekah Kennedy Rebekah Kennedy; 07/16/2017 3:52 PM) 07/16/2017 3:52 PM Weight: 251.4 lb Height: 66in Body Surface Area: 2.2 m Body Mass Index: 40.58 kg/m  Temp.: 98.23F  Pulse: 94 (Regular)  BP: 148/84 (Sitting, Left Arm,  Standard)       Physical Exam (Rebekah Kennedy A. Ninfa Linden MD; 07/16/2017 4:01 PM) General Mental Status-Alert. General Appearance-Consistent with stated age. Hydration-Well hydrated. Voice-Normal.  Head and Neck Head-normocephalic, atraumatic with no lesions or palpable masses. Trachea-midline. Thyroid Gland Characteristics - normal size and consistency.  Eye Eyeball - Bilateral-Extraocular movements intact. Sclera/Conjunctiva - Bilateral-No scleral icterus.  Chest and Lung Exam Chest and lung exam reveals -quiet, even and easy respiratory effort with no use of accessory muscles and on auscultation, normal breath sounds, no adventitious sounds and normal vocal resonance. Inspection Chest Wall - Normal. Back - normal.  Breast Breast - Left-Symmetric, Non Tender, No Biopsy scars, no Dimpling, No Inflammation, No Lumpectomy scars, No Mastectomy scars, No Peau d' Orange. Breast - Right-Symmetric, Non Tender, No Biopsy scars, no Dimpling, No Inflammation, No Lumpectomy scars, No Mastectomy scars, No Peau d' Orange. Breast Lump-No Palpable Breast Mass.  Cardiovascular Cardiovascular examination reveals -normal heart sounds, regular rate and rhythm with no murmurs and normal pedal pulses bilaterally.  Abdomen - Did not examine.  Neurologic - Did not examine.  Musculoskeletal - Did not examine.  Lymphatic Head & Neck  General Head & Neck Lymphatics: Bilateral - Description - Normal. Axillary  General Axillary Region: Bilateral - Description - Normal. Tenderness - Non Tender. Femoral & Inguinal - Did not examine.    Assessment & Plan (Rebekah Kennedy A. Ninfa Linden MD; 07/16/2017 4:02 PM) PAPILLOMA OF LEFT BREAST (D24.2) Impression: I discussed the diagnosis with the patient in detail. Radioactive seed guided left breast lumpectomy of the 2 papillomas is recommended given her strong family history of malignancies. She is in complete agreement. I will evacuated  this through one incision and possibly with only one radioactive seed as they are close together. I will leave that at discretion of the radiologist.  I discussed the surgical procedure with her in detail. I discussed the risks which includes but is not limited to bleeding, infection, the need for further surgery if malignancy is found, injury to surrounding structures, cardiopulmonary issues, DVT, etc. She understands and wished to proceed with surgery which will be scheduled

## 2017-07-24 NOTE — Anesthesia Preprocedure Evaluation (Addendum)
Anesthesia Evaluation    Reviewed: Allergy & Precautions, Patient's Chart, lab work & pertinent test results  Airway Mallampati: I       Dental no notable dental hx. (+) Teeth Intact   Pulmonary    Pulmonary exam normal breath sounds clear to auscultation       Cardiovascular hypertension, Pt. on medications and Pt. on home beta blockers Normal cardiovascular exam Rhythm:Regular Rate:Normal     Neuro/Psych    GI/Hepatic   Endo/Other  diabetes, Type 2, Insulin DependentMorbid obesity  Renal/GU      Musculoskeletal negative musculoskeletal ROS (+)   Abdominal (+) + obese,   Peds  Hematology   Anesthesia Other Findings Rebekah Kennedy  ECHO COMPLETE WO IMAGING ENHANCING AGENT  Order# [id number]  Reading physician: Jerline Pain, MD Ordering physician: [name], MD Study date: 05/24/17 Study Result   Result status: Final result                             *[facility] [employer].                        [place], [place] [address]                            [phone]  ------------------------------------------------------------------- Transthoracic Echocardiography  Patient:    Rebekah Kennedy, Rebekah Kennedy MR #:       [id number] Study Date: 05/24/2017 Gender:     F Age:        63 Height:     168 cm Weight:     112 kg BSA:        2.34 m^2 Pt. Status: Room:       [address] Rebekah Kennedy, [place] M  REFERRING    [facility], [place], Inpatient  SONOGRAPHER  Darlina Sicilian, RDCS  cc:  ------------------------------------------------------------------- LV EF: 60% -   65%  ------------------------------------------------------------------- Indications:      Chest pain  786.51.  ------------------------------------------------------------------- History:   PMH:   Transient ischemic attack.  Stroke.  Risk factors:  Hypertension. Dyslipidemia.  ------------------------------------------------------------------- Study Conclusions  - Left ventricle: The cavity size was normal. Wall thickness was   normal. Systolic function was normal. The estimated ejection   fraction was in the range of 60% to 65%. Wall motion was normal;   there were no regional wall motion abnormalities. Features are   consistent with a pseudonormal left ventricular filling pattern,   with concomitant abnormal relaxation and increased filling   pressure (grade 2 diastolic dysfunction). - Mitral valve: Mildly thickened leaflets .  ------------------------------------------------------------------- Study data:  Comparison was made to the study of 02/22/2011.  Study status    Reproductive/Obstetrics                            Anesthesia Physical Anesthesia Plan  ASA: III  Anesthesia Plan: General   Post-op Pain Management:  Induction: Intravenous  PONV Risk Score and Plan: 3 and Ondansetron, Dexamethasone and Midazolam  Airway Management Planned: LMA  Additional Equipment:   Intra-op Plan:   Post-operative Plan:   Informed Consent: I have reviewed the patients History and Physical, chart, labs and discussed the procedure including the risks, benefits and alternatives for the proposed anesthesia with the patient or authorized representative who has indicated his/her understanding and acceptance.     Plan Discussed with: CRNA and Surgeon  Anesthesia Plan Comments:        Anesthesia Quick Evaluation

## 2017-07-25 ENCOUNTER — Ambulatory Visit (HOSPITAL_COMMUNITY)
Admission: RE | Admit: 2017-07-25 | Discharge: 2017-07-25 | Disposition: A | Discharge status: Home / Self Care | Payer: BC Managed Care – PPO | Source: Ambulatory Visit | Attending: Surgery | Admitting: Surgery

## 2017-07-25 ENCOUNTER — Encounter (HOSPITAL_COMMUNITY): Payer: Self-pay | Admitting: *Deleted

## 2017-07-25 ENCOUNTER — Ambulatory Visit (HOSPITAL_COMMUNITY): Payer: BC Managed Care – PPO | Admitting: Anesthesiology

## 2017-07-25 ENCOUNTER — Ambulatory Visit (HOSPITAL_COMMUNITY): Payer: BC Managed Care – PPO | Admitting: Vascular Surgery

## 2017-07-25 ENCOUNTER — Encounter (HOSPITAL_COMMUNITY)
Admission: RE | Disposition: A | Discharge status: Home / Self Care | Payer: Self-pay | Source: Ambulatory Visit | Attending: Surgery

## 2017-07-25 DIAGNOSIS — D242 Benign neoplasm of left breast: Secondary | ICD-10-CM | POA: Diagnosis not present

## 2017-07-25 DIAGNOSIS — Z8041 Family history of malignant neoplasm of ovary: Secondary | ICD-10-CM | POA: Insufficient documentation

## 2017-07-25 DIAGNOSIS — Z794 Long term (current) use of insulin: Secondary | ICD-10-CM | POA: Insufficient documentation

## 2017-07-25 DIAGNOSIS — I10 Essential (primary) hypertension: Secondary | ICD-10-CM | POA: Insufficient documentation

## 2017-07-25 DIAGNOSIS — Z803 Family history of malignant neoplasm of breast: Secondary | ICD-10-CM | POA: Insufficient documentation

## 2017-07-25 DIAGNOSIS — Z79899 Other long term (current) drug therapy: Secondary | ICD-10-CM | POA: Insufficient documentation

## 2017-07-25 DIAGNOSIS — Z6841 Body Mass Index (BMI) 40.0 and over, adult: Secondary | ICD-10-CM | POA: Insufficient documentation

## 2017-07-25 DIAGNOSIS — E118 Type 2 diabetes mellitus with unspecified complications: Secondary | ICD-10-CM | POA: Insufficient documentation

## 2017-07-25 HISTORY — PX: BREAST LUMPECTOMY WITH RADIOACTIVE SEED LOCALIZATION: SHX6424

## 2017-07-25 LAB — GLUCOSE, CAPILLARY
Glucose-Capillary: 302 mg/dL — ABNORMAL HIGH (ref 65–99)
Glucose-Capillary: 287 mg/dL — ABNORMAL HIGH (ref 65–99)
Glucose-Capillary: 249 mg/dL — ABNORMAL HIGH (ref 65–99)

## 2017-07-25 SURGERY — BREAST LUMPECTOMY WITH RADIOACTIVE SEED LOCALIZATION
Anesthesia: General | Site: Breast | Laterality: Left

## 2017-07-25 MED ORDER — OXYCODONE HCL 5 MG PO TABS: 5.0000 mg | ORAL_TABLET | Freq: Once | ORAL | Status: DC | PRN

## 2017-07-25 MED ORDER — CHLORHEXIDINE GLUCONATE CLOTH 2 % EX PADS: 6.0000 | MEDICATED_PAD | Freq: Once | CUTANEOUS | Status: DC

## 2017-07-25 MED ORDER — KETOROLAC TROMETHAMINE 30 MG/ML IJ SOLN: 30.0000 mg | Freq: Once | INTRAMUSCULAR | Status: DC | PRN

## 2017-07-25 MED ORDER — OXYCODONE HCL 5 MG/5ML PO SOLN: 5.0000 mg | Freq: Once | ORAL | Status: DC | PRN

## 2017-07-25 MED ORDER — CELECOXIB 200 MG PO CAPS
200.0000 mg | ORAL_CAPSULE | ORAL | Status: AC
  Administered 2017-07-25: 200 mg via ORAL

## 2017-07-25 MED ORDER — FENTANYL CITRATE (PF) 250 MCG/5ML IJ SOLN
INTRAMUSCULAR | Status: AC
  Filled 2017-07-25: qty 5

## 2017-07-25 MED ORDER — CEFAZOLIN SODIUM-DEXTROSE 2-4 GM/100ML-% IV SOLN
INTRAVENOUS | Status: AC
  Filled 2017-07-25: qty 100

## 2017-07-25 MED ORDER — ACETAMINOPHEN 160 MG/5ML PO SOLN: 325.0000 mg | ORAL | Status: DC | PRN

## 2017-07-25 MED ORDER — FENTANYL CITRATE (PF) 100 MCG/2ML IJ SOLN: 25.0000 ug | INTRAMUSCULAR | Status: DC | PRN

## 2017-07-25 MED ORDER — BUPIVACAINE-EPINEPHRINE (PF) 0.5% -1:200000 IJ SOLN
INTRAMUSCULAR | Status: AC
  Filled 2017-07-25: qty 30

## 2017-07-25 MED ORDER — PROPOFOL 10 MG/ML IV BOLUS
INTRAVENOUS | Status: DC | PRN
  Administered 2017-07-25: 200 mg via INTRAVENOUS

## 2017-07-25 MED ORDER — MIDAZOLAM HCL 5 MG/5ML IJ SOLN
INTRAMUSCULAR | Status: DC | PRN
Start: 2017-07-25 — End: 2017-07-25
  Administered 2017-07-25: 1 mg via INTRAVENOUS

## 2017-07-25 MED ORDER — BUPIVACAINE-EPINEPHRINE 0.5% -1:200000 IJ SOLN
INTRAMUSCULAR | Status: DC | PRN
Start: 2017-07-25 — End: 2017-07-25
  Administered 2017-07-25: 20 mL

## 2017-07-25 MED ORDER — METHYLENE BLUE 0.5 % INJ SOLN
INTRAVENOUS | Status: AC
  Filled 2017-07-25: qty 10

## 2017-07-25 MED ORDER — ACETAMINOPHEN 500 MG PO TABS
1000.0000 mg | ORAL_TABLET | ORAL | Status: AC
  Administered 2017-07-25: 1000 mg via ORAL

## 2017-07-25 MED ORDER — CELECOXIB 200 MG PO CAPS
ORAL_CAPSULE | ORAL | Status: AC
  Filled 2017-07-25: qty 1

## 2017-07-25 MED ORDER — DEXAMETHASONE SODIUM PHOSPHATE 10 MG/ML IJ SOLN
INTRAMUSCULAR | Status: DC | PRN
  Administered 2017-07-25: 5 mg via INTRAVENOUS

## 2017-07-25 MED ORDER — ACETAMINOPHEN 325 MG PO TABS: 325.0000 mg | ORAL_TABLET | ORAL | Status: DC | PRN

## 2017-07-25 MED ORDER — PHENYLEPHRINE HCL 10 MG/ML IJ SOLN
INTRAMUSCULAR | Status: DC | PRN
  Administered 2017-07-25 (×2): 40 ug via INTRAVENOUS
  Administered 2017-07-25: 20 ug via INTRAVENOUS

## 2017-07-25 MED ORDER — PROPOFOL 10 MG/ML IV BOLUS
INTRAVENOUS | Status: AC
  Filled 2017-07-25: qty 20

## 2017-07-25 MED ORDER — ONDANSETRON HCL 4 MG/2ML IJ SOLN
INTRAMUSCULAR | Status: DC | PRN
  Administered 2017-07-25: 4 mg via INTRAVENOUS

## 2017-07-25 MED ORDER — GABAPENTIN 300 MG PO CAPS
300.0000 mg | ORAL_CAPSULE | ORAL | Status: AC
  Administered 2017-07-25: 300 mg via ORAL

## 2017-07-25 MED ORDER — CEFAZOLIN SODIUM-DEXTROSE 2-4 GM/100ML-% IV SOLN
2.0000 g | INTRAVENOUS | Status: AC
  Administered 2017-07-25: 2 g via INTRAVENOUS

## 2017-07-25 MED ORDER — LACTATED RINGERS IV SOLN
INTRAVENOUS | Status: DC | PRN
  Administered 2017-07-25: 07:00:00 via INTRAVENOUS

## 2017-07-25 MED ORDER — MIDAZOLAM HCL 2 MG/2ML IJ SOLN
INTRAMUSCULAR | Status: AC
  Filled 2017-07-25: qty 2

## 2017-07-25 MED ORDER — MEPERIDINE HCL 50 MG/ML IJ SOLN: 6.2500 mg | INTRAMUSCULAR | Status: DC | PRN

## 2017-07-25 MED ORDER — EPHEDRINE SULFATE 50 MG/ML IJ SOLN
INTRAMUSCULAR | Status: DC | PRN
  Administered 2017-07-25 (×3): 10 mg via INTRAVENOUS

## 2017-07-25 MED ORDER — OXYCODONE HCL 5 MG PO TABS: 5.0000 mg | ORAL_TABLET | Freq: Four times a day (QID) | ORAL | 0 refills | Status: DC | PRN

## 2017-07-25 MED ORDER — FENTANYL CITRATE (PF) 250 MCG/5ML IJ SOLN
INTRAMUSCULAR | Status: DC | PRN
  Administered 2017-07-25: 100 ug via INTRAVENOUS

## 2017-07-25 MED ORDER — ONDANSETRON HCL 4 MG/2ML IJ SOLN
4.0000 mg | Freq: Once | INTRAMUSCULAR | Status: DC | PRN
Start: 2017-07-25 — End: 2017-07-25

## 2017-07-25 MED ORDER — ACETAMINOPHEN 500 MG PO TABS
ORAL_TABLET | ORAL | Status: AC
  Filled 2017-07-25: qty 2

## 2017-07-25 MED ORDER — INSULIN ASPART 100 UNIT/ML ~~LOC~~ SOLN
10.0000 [IU] | Freq: Once | SUBCUTANEOUS | Status: AC
  Administered 2017-07-25: 10 [IU] via SUBCUTANEOUS

## 2017-07-25 MED ORDER — LIDOCAINE HCL (CARDIAC) 20 MG/ML IV SOLN
INTRAVENOUS | Status: DC | PRN
  Administered 2017-07-25: 100 mg via INTRATRACHEAL

## 2017-07-25 MED ORDER — GABAPENTIN 300 MG PO CAPS
ORAL_CAPSULE | ORAL | Status: AC
  Filled 2017-07-25: qty 1

## 2017-07-25 MED ORDER — 0.9 % SODIUM CHLORIDE (POUR BTL) OPTIME
TOPICAL | Status: DC | PRN
  Administered 2017-07-25: 1000 mL

## 2017-07-25 SURGICAL SUPPLY — 44 items
ADH SKN CLS APL DERMABOND .7 (GAUZE/BANDAGES/DRESSINGS) ×1
APPLIER CLIP 9.375 MED OPEN (MISCELLANEOUS)
APR CLP MED 9.3 20 MLT OPN (MISCELLANEOUS)
BINDER BREAST LRG (GAUZE/BANDAGES/DRESSINGS) IMPLANT
BINDER BREAST XLRG (GAUZE/BANDAGES/DRESSINGS) ×2 IMPLANT
BLADE SURG 15 STRL LF DISP TIS (BLADE) ×1 IMPLANT
BLADE SURG 15 STRL SS (BLADE) ×2
CANISTER SUCT 3000ML PPV (MISCELLANEOUS) ×2 IMPLANT
CHLORAPREP W/TINT 26ML (MISCELLANEOUS) ×2 IMPLANT
CLIP APPLIE 9.375 MED OPEN (MISCELLANEOUS) ×1 IMPLANT
COVER PROBE W GEL 5X96 (DRAPES) ×2 IMPLANT
COVER SURGICAL LIGHT HANDLE (MISCELLANEOUS) ×2 IMPLANT
DERMABOND ADVANCED (GAUZE/BANDAGES/DRESSINGS) ×1
DERMABOND ADVANCED .7 DNX12 (GAUZE/BANDAGES/DRESSINGS) ×1 IMPLANT
DEVICE DUBIN SPECIMEN MAMMOGRA (MISCELLANEOUS) ×2 IMPLANT
DRAPE CHEST BREAST 15X10 FENES (DRAPES) ×2 IMPLANT
DRAPE UTILITY XL STRL (DRAPES) ×2 IMPLANT
ELECT CAUTERY BLADE 6.4 (BLADE) ×2 IMPLANT
ELECT REM PT RETURN 9FT ADLT (ELECTROSURGICAL) ×2
ELECTRODE REM PT RTRN 9FT ADLT (ELECTROSURGICAL) ×1 IMPLANT
GLOVE SURG SIGNA 7.5 PF LTX (GLOVE) ×2 IMPLANT
GOWN STRL REUS W/ TWL LRG LVL3 (GOWN DISPOSABLE) ×1 IMPLANT
GOWN STRL REUS W/ TWL XL LVL3 (GOWN DISPOSABLE) ×1 IMPLANT
GOWN STRL REUS W/TWL LRG LVL3 (GOWN DISPOSABLE) ×2
GOWN STRL REUS W/TWL XL LVL3 (GOWN DISPOSABLE) ×2
KIT BASIN OR (CUSTOM PROCEDURE TRAY) ×2 IMPLANT
KIT MARKER MARGIN INK (KITS) ×2 IMPLANT
NDL SAFETY ECLIPSE 18X1.5 (NEEDLE) IMPLANT
NEEDLE FILTER BLUNT 18X 1/2SAF (NEEDLE)
NEEDLE FILTER BLUNT 18X1 1/2 (NEEDLE) IMPLANT
NEEDLE HYPO 18GX1.5 SHARP (NEEDLE)
NEEDLE HYPO 25GX1X1/2 BEV (NEEDLE) ×2 IMPLANT
NS IRRIG 1000ML POUR BTL (IV SOLUTION) ×2 IMPLANT
PACK SURGICAL SETUP 50X90 (CUSTOM PROCEDURE TRAY) ×2 IMPLANT
PENCIL BUTTON HOLSTER BLD 10FT (ELECTRODE) ×2 IMPLANT
SPONGE LAP 18X18 X RAY DECT (DISPOSABLE) ×2 IMPLANT
SUT MNCRL AB 4-0 PS2 18 (SUTURE) ×2 IMPLANT
SUT VIC AB 3-0 SH 18 (SUTURE) ×2 IMPLANT
SYR BULB 3OZ (MISCELLANEOUS) ×2 IMPLANT
SYR CONTROL 10ML LL (SYRINGE) ×2 IMPLANT
TOWEL OR 17X24 6PK STRL BLUE (TOWEL DISPOSABLE) ×2 IMPLANT
TOWEL OR 17X26 10 PK STRL BLUE (TOWEL DISPOSABLE) ×2 IMPLANT
TUBE CONNECTING 12X1/4 (SUCTIONS) ×2 IMPLANT
YANKAUER SUCT BULB TIP NO VENT (SUCTIONS) ×2 IMPLANT

## 2017-07-25 NOTE — Anesthesia Postprocedure Evaluation (Signed)
Anesthesia Post Note  Patient: Rebekah Kennedy  Procedure(s) Performed: LEFT BREAST LUMPECTOMY WITH RADIOACTIVE SEED LOCALIZATION (Left Breast)     Patient location during evaluation: PACU Anesthesia Type: General Level of consciousness: awake Pain management: pain level controlled Vital Signs Assessment: post-procedure vital signs reviewed and stable Respiratory status: spontaneous breathing Cardiovascular status: stable Postop Assessment: no apparent nausea or vomiting Anesthetic complications: no    Last Vitals:  Vitals:   07/25/17 0825 07/25/17 0845  BP: 118/68 (!) 123/100  Pulse: 82 78  Resp: 18   Temp: (!) 36.3 C   SpO2: 97% 96%    Last Pain:  Vitals:   07/25/17 0825  TempSrc:   PainSc: 0-No pain   Pain Goal:                 Sabra Sessler JR,JOHN Iwalani Templeton

## 2017-07-25 NOTE — Anesthesia Procedure Notes (Signed)
Procedure Name: LMA Insertion Date/Time: 07/25/2017 7:40 AM Performed by: Mariea Clonts, CRNA Pre-anesthesia Checklist: Patient identified, Emergency Drugs available, Suction available and Patient being monitored Patient Re-evaluated:Patient Re-evaluated prior to induction Oxygen Delivery Method: Circle System Utilized Preoxygenation: Pre-oxygenation with 100% oxygen Induction Type: IV induction Ventilation: Mask ventilation without difficulty LMA: LMA inserted LMA Size: 4.0 Number of attempts: 2 Airway Equipment and Method: Bite block Placement Confirmation: positive ETCO2 Tube secured with: Tape Dental Injury: Teeth and Oropharynx as per pre-operative assessment

## 2017-07-25 NOTE — Interval H&P Note (Signed)
History and Physical Interval Note: no change in H and P  07/25/2017 7:06 AM  Rebekah Kennedy  has presented today for surgery, with the diagnosis of LEFT BREAST PAPILLOMA X2  The various methods of treatment have been discussed with the patient and family. After consideration of risks, benefits and other options for treatment, the patient has consented to  Procedure(s): LEFT BREAST LUMPECTOMY WITH RADIOACTIVE SEED LOCALIZATION (Left) as a surgical intervention .  The patient's history has been reviewed, patient examined, no change in status, stable for surgery.  I have reviewed the patient's chart and labs.  Questions were answered to the patient's satisfaction.     Vannia Pola A

## 2017-07-25 NOTE — Discharge Instructions (Signed)
[facility] °Office Phone Number [phone] ° °BREAST BIOPSY/ PARTIAL MASTECTOMY: POST OP INSTRUCTIONS ° °Always review your discharge instruction sheet given to you by the facility where your surgery was performed. ° °IF YOU HAVE DISABILITY OR FAMILY LEAVE FORMS, YOU MUST BRING THEM TO THE OFFICE FOR PROCESSING.  DO NOT GIVE THEM TO YOUR DOCTOR. ° °1. A prescription for pain medication may be given to you upon discharge.  Take your pain medication as prescribed, if needed.  If narcotic pain medicine is not needed, then you may take acetaminophen (Tylenol) or ibuprofen (Advil) as needed. °2. Take your usually prescribed medications unless otherwise directed °3. If you need a refill on your pain medication, please contact your pharmacy.  They will contact our office to request authorization.  Prescriptions will not be filled after 5pm or on week-ends. °4. You should eat very light the first 24 hours after surgery, such as soup, crackers, pudding, etc.  Resume your normal diet the day after surgery. °5. Most patients will experience some swelling and bruising in the breast.  Ice packs and a good support bra will help.  Swelling and bruising can take several days to resolve.  °6. It is common to experience some constipation if taking pain medication after surgery.  Increasing fluid intake and taking a stool softener will usually help or prevent this problem from occurring.  A mild laxative (Milk of Magnesia or Miralax) should be taken according to package directions if there are no bowel movements after 48 hours. °7. Unless discharge instructions indicate otherwise, you may remove your bandages 24-48 hours after surgery, and you may shower at that time.  You may have steri-strips (small skin tapes) in place directly over the incision.  These strips should be left on the skin for 7-10 days.  If your surgeon used skin glue on the incision, you may shower in 24 hours.  The glue will flake off over the  next 2-3 weeks.  Any sutures or staples will be removed at the office during your follow-up visit. °8. ACTIVITIES:  You may resume regular daily activities (gradually increasing) beginning the next day.  Wearing a good support bra or sports bra minimizes pain and swelling.  You may have sexual intercourse when it is comfortable. °a. You may drive when you no longer are taking prescription pain medication, you can comfortably wear a seatbelt, and you can safely maneuver your car and apply brakes. °b. RETURN TO WORK:  ______________________________________________________________________________________ °9. You should see your doctor in the office for a follow-up appointment approximately two weeks after your surgery.  Your doctor’s nurse will typically make your follow-up appointment when she calls you with your pathology report.  Expect your pathology report 2-3 business days after your surgery.  You may call to check if you do not hear from us after three days. °10. OTHER INSTRUCTIONS: _______________________________________________________________________________________________ _____________________________________________________________________________________________________________________________________ °_____________________________________________________________________________________________________________________________________ °_____________________________________________________________________________________________________________________________________ ° °WHEN TO CALL YOUR DOCTOR: °1. Fever over 101.0 °2. Nausea and/or vomiting. °3. Extreme swelling or bruising. °4. Continued bleeding from incision. °5. Increased pain, redness, or drainage from the incision. ° °The clinic staff is available to answer your questions during regular business hours.  Please don’t hesitate to call and ask to speak to one of the nurses for clinical concerns.  If you have a medical emergency, go to the nearest  emergency room or call 911.  A surgeon from [facility] is always on call at the hospital. ° °For further questions, please visit [email or web address]  °

## 2017-07-25 NOTE — Transfer of Care (Signed)
Immediate Anesthesia Transfer of Care Note  Patient: Rebekah Kennedy  Procedure(s) Performed: LEFT BREAST LUMPECTOMY WITH RADIOACTIVE SEED LOCALIZATION (Left Breast)  Patient Location: PACU  Anesthesia Type:General  Level of Consciousness: awake, alert  and oriented  Airway & Oxygen Therapy: Patient Spontanous Breathing and Patient connected to nasal cannula oxygen  Post-op Assessment: Report given to RN, Post -op Vital signs reviewed and stable and Patient moving all extremities X 4  Post vital signs: Reviewed and stable  Last Vitals:  Vitals:   07/25/17 0642  BP: (!) 172/78  Pulse: 78  Resp: 18  Temp: 36.5 C  SpO2: 100%    Last Pain:  Vitals:   07/25/17 0642  TempSrc: Oral         Complications: No apparent anesthesia complications

## 2017-07-25 NOTE — Op Note (Signed)
LEFT BREAST LUMPECTOMY WITH RADIOACTIVE SEED LOCALIZATION  Procedure Note  Rebekah Kennedy 07/25/2017   Pre-op Diagnosis: LEFT BREAST PAPILLOMA X2     Post-op Diagnosis: same  Procedure(s): LEFT BREAST LUMPECTOMY WITH RADIOACTIVE SEED LOCALIZATION  Surgeon(s): Coralie Keens, MD  Anesthesia: General  Staff:  Circulator: Rosanne Sack, RN Scrub Person: Nathen May, [employer] Circulator Assistant: Marliss Czar, RN  Estimated Blood Loss: Minimal               Specimens: sent to path  Indications: This is a 62 year old female with 2 separate papillomas in the left breast.  The decision was made to proceed with a radioactive seed guided lumpectomy  Procedure: The patient was brought to the operating room and identified as the correct patient.  She was placed supine on the operating table and general anesthesia was induced.  Her left breast was prepped and draped in usual sterile fashion.  I anesthetized the skin at the edge of the areola with Marcaine.  I then made a circumareolar incision at the 12 o'clock position with a scalpel.  I dissected into the breast tissue with electrocautery.  With the aid of the neoprobe, I was able to widely dissect around both radioactive seeds in the 1 specimen.  I then completed the lumpectomy staying around the seeds.  I then marked the specimen with paint.  X-ray confirmed that both radioactive seeds as well as 3 previously placed markers were all in the specimen.  This was sent to pathology for evaluation.  I achieved hemostasis with the cautery.  I anesthetized the wound further with Marcaine.  I closed the subcutaneous tissue with interrupted 3-0 Vicryl sutures.  I closed the skin with a 4-0 Monocryl suture.  Dermabond, gauze, and a binder were then placed.  The patient tolerated the procedure well.  All the counts were correct at the end of the procedure.  The patient was then extubated in the operating room and taken in a stable condition to the  recovery room.          [name]   Date: 07/25/2017  Time: 8:17 AM

## 2017-07-25 NOTE — Progress Notes (Signed)
notifed dr Jillyn Hidden of pts [id number] no rx ordered at this time will instruct pt to cover self as nor,al when she gets home and pt instructed and understands

## 2017-07-26 ENCOUNTER — Ambulatory Visit: Payer: Self-pay | Admitting: General Surgery

## 2017-07-26 ENCOUNTER — Encounter (HOSPITAL_COMMUNITY): Payer: Self-pay | Admitting: Surgery

## 2017-07-29 ENCOUNTER — Ambulatory Visit: Payer: BC Managed Care – PPO | Admitting: Endocrinology

## 2017-07-29 ENCOUNTER — Telehealth: Payer: Self-pay | Admitting: Skilled Nursing Facility1

## 2017-07-29 ENCOUNTER — Encounter: Payer: Self-pay | Admitting: Endocrinology

## 2017-07-29 VITALS — BP 146/79 | HR 86 | Wt 250.0 lb

## 2017-07-29 DIAGNOSIS — Z794 Long term (current) use of insulin: Secondary | ICD-10-CM

## 2017-07-29 DIAGNOSIS — E1151 Type 2 diabetes mellitus with diabetic peripheral angiopathy without gangrene: Secondary | ICD-10-CM

## 2017-07-29 MED ORDER — BASAGLAR KWIKPEN 100 UNIT/ML ~~LOC~~ SOPN: 120.0000 [IU] | PEN_INJECTOR | SUBCUTANEOUS | 11 refills | Status: DC

## 2017-07-29 NOTE — Progress Notes (Signed)
Subjective:    Patient ID: Rebekah Kennedy, female    DOB: [birth year], 62 y.o.   MRN: [id number]  HPI Pt returns for f/u of diabetes mellitus:  DM type: Insulin-requiring type 2.  Dx'ed: 2004.  Complications: CVA, nephropathy, polyneuropathy, and prolif retinopathy.  Therapy: insulin since 2013 GDM: never.  DKA: never Severe hypoglycemia: once, in 2018.  Pancreatitis: never.  Other: due to noncompliance with insulin injections and cbg monitoring, she is on a qd insulin regimen.   Interval history: Pt says she seldom misses the insulin.  Bariatric surg has been delayed until 08/12/17.  no cbg record, but states cbg's vary from 90-300.  There is no trend throughout the day.  Past Medical History:  Diagnosis Date  . ASTHMA NOS W/ACUTE EXACERBATION 07/10/2010  . Cataract   . Cervical dysplasia   . CHEST PAIN 01/23/2007  . DEPRESSION 03/16/2007  . DIABETES MELLITUS, TYPE II 12/01/2006  . Dysmenorrhea   . Fibroid   . HYPERLIPIDEMIA 12/01/2006  . HYPERTENSION 07/07/2006  . Obesity   . Osteopenia 05/2017   T score -1.5 FRAX 2.6% / 0.1%  . Sleep apnea   . Splenomegaly    in college  . 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 TIA (transient ischemic attack) 2012    Past Surgical History:  Procedure Laterality Date  . 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 APPENDECTOMY    . BIOPSY THYROID  05/02/11   Nonneoplastic goiter  . BREAST BIOPSY    . BREAST LUMPECTOMY WITH RADIOACTIVE SEED LOCALIZATION Left 07/25/2017   Procedure: LEFT BREAST LUMPECTOMY WITH RADIOACTIVE SEED LOCALIZATION;  Surgeon: Coralie Keens, MD;  Location: [facility];  Service: General;  Laterality: Left;  . BREAST SURGERY     Reduction  . COLPOSCOPY    . DILATION AND CURETTAGE OF UTERUS  1975   DUB  . EYE SURGERY     Laser  . GYNECOLOGIC CRYOSURGERY    . MYOMECTOMY    . OVARIAN CYST REMOVAL    . PELVIC LAPAROSCOPY  75,88   DL lysis of adhesions    Social History   Socioeconomic History    . Marital status: Single    Spouse name: Not on file  . Number of children: 2  . Years of education: Not on file  . Highest education level: Not on file  Social Needs  . Financial resource strain: Not on file  . Food insecurity - worry: Not on file  . Food insecurity - inability: Not on file  . Transportation needs - medical: Not on file  . Transportation needs - non-medical: Not on file  Occupational History  . Occupation: Product manager: [employer]  Tobacco Use  . Smoking status: Never Smoker  . Smokeless tobacco: Never Used  Substance and Sexual Activity  . Alcohol use: Yes    Alcohol/week: 0.0 oz    Comment: very rare  . Drug use: No  . Sexual activity: Not Currently    Birth control/protection: Post-menopausal    Comment: 1st intercourse 62 yo-Fewer than 5 partners  Other Topics Concern  . 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 albuterol (PROVENTIL HFA;VENTOLIN HFA) 108 (90 Base) MCG/ACT inhaler Inhale 2  puffs into the lungs every 4 (four) hours as needed for wheezing or shortness of breath. 18 g 2  . albuterol (PROVENTIL) (2.5 MG/3ML) 0.083% nebulizer solution Take 3 mLs (2.5 mg total) by nebulization every 6 (six) hours as needed for wheezing or shortness of breath. 150 mL 1  . amLODipine (NORVASC) 10 MG tablet TAKE ONE TABLET BY MOUTH ONE TIME DAILY 90 tablet 1  . BD ULTRA-FINE PEN NEEDLES 29G X 12.7MM MISC USE 3 TIMES DAILY AS DIRECTED 100 each 1  . Biotin 5000 MCG TABS Take 1 tablet by mouth daily.    . blood glucose meter kit and supplies KIT One touch verio. Use up to four times daily as directed. (FOR ICD-9 250.00, 250.01). 1 each 0  . Carboxymethylcellul-Glycerin (LUBRICATING EYE DROPS OP) Place 1 drop into both eyes daily as needed (dry eyes).    . clopidogrel  (PLAVIX) 75 MG tablet Take 1 tablet (75 mg total) by mouth daily. 90 tablet 3  . losartan (COZAAR) 100 MG tablet TAKE 1 TAB BY MOUTH ONCE DAILY. 90 tablet 1  . metoprolol succinate (TOPROL-XL) 100 MG 24 hr tablet TAKE 1 TABLET BY MOUTH DAILY WITH OR IMMEDIATELY FOLLOWING A MEAL 90 tablet 1  . montelukast (SINGULAIR) 10 MG tablet Take 1 tablet (10 mg total) by mouth at bedtime. (Patient taking differently: Take 10 mg by mouth daily. ) 90 tablet 3  . Multiple Vitamin (MULTIVITAMIN) tablet Take 1 tablet by mouth daily.      Marland Kitchen oxyCODONE (OXY IR/ROXICODONE) 5 MG immediate release tablet Take 1-2 tablets (5-10 mg total) by mouth every 6 (six) hours as needed for moderate pain, severe pain or breakthrough pain. 20 tablet 0  . pravastatin (PRAVACHOL) 20 MG tablet Take 1 tablet (20 mg total) by mouth daily. 90 tablet 3  . spironolactone (ALDACTONE) 25 MG tablet Take 2 tablets (50 mg total) by mouth daily. 90 tablet 1   No current facility-administered medications on file prior to visit.     Allergies  Allergen Reactions  . Quinapril Cough    Family History  Problem Relation Age of Onset  . Cancer Maternal Grandmother        Colon Cancer  . Asthma Maternal Grandmother   . Diabetes Father   . Heart disease Father   . Hypertension Father   . Hyperlipidemia Father   . Hypertension Mother   . Heart disease Mother   . Ovarian cancer Mother   . Cancer Mother        Lung cancer  . Asthma Mother   . COPD Mother   . Hyperlipidemia Mother   . Diabetes Brother   . Hypertension Brother   . Kidney disease Brother   . Asthma Brother   . Heart disease Brother   . Hyperlipidemia Brother   . Cancer Brother        Prostate  . Graves' disease Sister   . Diabetes Sister   . Breast cancer Sister 25  . Graves' disease Paternal Grandmother   . Hypertension Paternal Grandmother   . Heart disease Paternal Grandmother   . Alzheimer's disease Paternal Grandmother   . Cancer Maternal Grandfather   .  Heart failure Brother   . Heart failure Brother     BP (!) 146/79 (BP Location: Left Arm, Cuff Size: Large)   Pulse 86   Wt 250 lb (113.4 kg)   SpO2 95%   BMI 40.35 kg/m    Review of Systems She denies hypoglycemia.  Objective:   Physical Exam VITAL SIGNS:  See vs page GENERAL: no distress Pulses: dorsalis pedis intact bilat.   MSK: no deformity of the feet CV: 2+ bilat leg edema Skin:  no ulcer on the feet.  normal color and temp on the feet. Neuro: sensation is intact to touch on the feet Ext: There is bilateral onychomycosis of the toenails  Lab Results  Component Value Date   HGBA1C 9.6 (H) 07/22/2017       Assessment & Plan:  Insulin-requiring type 2 DM, with PDR: worse. Obesity: persistent: we want to improve glycemic control due to upcoming surgery HTN: is noted today.    Patient Instructions  Your blood pressure is high today.  Please see your primary care provider soon, to have it rechecked Please increase the basaglar to 120 units each morning. When you leave the hospital, they will send you home on less insulin, or none at all.   check your blood sugar twice a day.  vary the time of day when you check, between before the 3 meals, and at bedtime.  also check if you have symptoms of your blood sugar being too high or too low.  please keep a record of the readings and bring it to your next appointment here (or you can bring the meter itself).  You can write it on any piece of paper.  please call us sooner if your blood sugar goes below 70, or if you have a lot of readings over 200. Please come back for a follow-up appointment in 1 month.

## 2017-07-29 NOTE — Patient Instructions (Addendum)
Your blood pressure is high today.  Please see your primary care provider soon, to have it rechecked Please increase the basaglar to 120 units each morning. When you leave the hospital, they will send you home on less insulin, or none at all.   check your blood sugar twice a day.  vary the time of day when you check, between before the 3 meals, and at bedtime.  also check if you have symptoms of your blood sugar being too high or too low.  please keep a record of the readings and bring it to your next appointment here (or you can bring the meter itself).  You can write it on any piece of paper.  please call us sooner if your blood sugar goes below 70, or if you have a lot of readings over 200. Please come back for a follow-up appointment in 1 month.

## 2017-07-29 NOTE — Telephone Encounter (Signed)
Called pt for an appointment. Left voicemail.

## 2017-08-08 ENCOUNTER — Encounter (HOSPITAL_COMMUNITY)
Admission: RE | Admit: 2017-08-08 | Discharge: 2017-08-08 | Disposition: A | Discharge status: Home / Self Care | Payer: BC Managed Care – PPO | Source: Ambulatory Visit | Attending: General Surgery | Admitting: General Surgery

## 2017-08-08 ENCOUNTER — Other Ambulatory Visit: Payer: Self-pay

## 2017-08-08 ENCOUNTER — Encounter (HOSPITAL_COMMUNITY): Payer: Self-pay

## 2017-08-08 DIAGNOSIS — E669 Obesity, unspecified: Secondary | ICD-10-CM | POA: Diagnosis not present

## 2017-08-08 DIAGNOSIS — Z01812 Encounter for preprocedural laboratory examination: Secondary | ICD-10-CM | POA: Insufficient documentation

## 2017-08-08 HISTORY — DX: Retinal edema: H35.81

## 2017-08-08 LAB — CBC WITH DIFFERENTIAL/PLATELET
Basophils Absolute: 0.1 10*3/uL (ref 0.0–0.1)
Basophils Relative: 1 %
EOS ABS: 0.2 10*3/uL (ref 0.0–0.7)
EOS PCT: 2 %
HCT: 43.2 % (ref 36.0–46.0)
Hemoglobin: 13.8 g/dL (ref 12.0–15.0)
LYMPHS ABS: 3.6 10*3/uL (ref 0.7–4.0)
Lymphocytes Relative: 37 %
MCH: 27.1 pg (ref 26.0–34.0)
MCHC: 31.9 g/dL (ref 30.0–36.0)
MCV: 84.7 fL (ref 78.0–100.0)
MONOS PCT: 7 %
Monocytes Absolute: 0.7 10*3/uL (ref 0.1–1.0)
Neutro Abs: 5 10*3/uL (ref 1.7–7.7)
Neutrophils Relative %: 53 %
Platelets: 366 10*3/uL (ref 150–400)
RBC: 5.1 MIL/uL (ref 3.87–5.11)
RDW: 13.9 % (ref 11.5–15.5)
WBC: 9.5 10*3/uL (ref 4.0–10.5)

## 2017-08-08 LAB — COMPREHENSIVE METABOLIC PANEL
ALT: 30 U/L (ref 14–54)
ANION GAP: 10 (ref 5–15)
AST: 23 U/L (ref 15–41)
Albumin: 3.8 g/dL (ref 3.5–5.0)
Alkaline Phosphatase: 67 U/L (ref 38–126)
BUN: 24 mg/dL — ABNORMAL HIGH (ref 6–20)
CHLORIDE: 106 mmol/L (ref 101–111)
CO2: 26 mmol/L (ref 22–32)
Calcium: 10.6 mg/dL — ABNORMAL HIGH (ref 8.9–10.3)
Creatinine, Ser: 0.73 mg/dL (ref 0.44–1.00)
GFR calc non Af Amer: >60 mL/min (ref >60)
Glucose, Bld: 119 mg/dL — ABNORMAL HIGH (ref 65–99)
POTASSIUM: 4.5 mmol/L (ref 3.5–5.1)
SODIUM: 142 mmol/L (ref 135–145)
Total Bilirubin: 0.5 mg/dL (ref 0.3–1.2)
Total Protein: 7.2 g/dL (ref 6.5–8.1)

## 2017-08-08 LAB — GLUCOSE, CAPILLARY: GLUCOSE-CAPILLARY: 115 mg/dL — AB (ref 65–99)

## 2017-08-08 LAB — HEMOGLOBIN A1C
Hgb A1c MFr Bld: 8.6 % — ABNORMAL HIGH (ref 4.8–5.6)
Mean Plasma Glucose: 200.12 mg/dL

## 2017-08-08 NOTE — Patient Instructions (Addendum)
Rebekah Kennedy  08/08/2017   Your procedure is scheduled on: 08-12-17  Report to [facility] Main  Entrance   ARRIVE AT 29 AM. Have a seat in the Main Lobby. Please note there is a phone at the [employer]. Please call [phone] on that phone. Someone from Short Stay will come and get you from the Main Lobby and take you to Short Stay.   Call this number if you have problems the morning of surgery [phone]   PLEASE BRING CPAP MASK AND TUBING ONLY. DEVICE WILL BE PROVIDED!   Remember:  MORNING OF SURGERY DRINK:  1SHAKE BEFORE YOU LEAVE HOME, DRINK ALL OF THE SHAKE AT ONE TIME.   NO SOLID FOOD AFTER 6:00 PM THE NIGHT BEFORE YOUR SURGERY. YOU MAY DRINK CLEAR FLUIDS (SEE DIET BELOW). THE SHAKE YOU  DRINK BEFORE YOU LEAVE HOME WILL BE THE LAST FLUIDS YOU DRINK BEFORE SURGERY.   Take these medicines the morning of surgery with A SIP OF WATER:metoprolol, amlodipine, oxycodone if needed, eye drops if needed, albuterol inhaler if needed (please bring to hospital) , albuterol nebulizer if needed   *PAIN IS EXPECTED AFTER SURGERY AND WILL NOT BE COMPLETELY ELIMINATED. AMBULATION AND TYLENOL WILL HELP REDUCE INCISIONAL AND GAS PAIN. MOVEMENT IS KEY! *YOU ARE EXPECTED TO BE OUT OF BED WITHIN 4 HOURS OF ADMISSION TO YOUR PATIENT ROOM. *SITTING IN THE RECLINER THROUGHOUT THE DAY IS IMPORTANT FOR DRINKING FLUIDS AND MOVING GAS THROUGHOUT THE GI TRACT. *COMPRESSION STOCKINGS SHOULD BE WORN Webb UNLESS YOU ARE WALKING.  *INCENTIVE SPIROMETER SHOULD BE USED EVERY HOUR WHILE AWAKE TO DECREASE POST-OPERATIVE COMPLICATIONS SUCH AS PNEUMONIA. *WHEN DISCHARGED HOME, IT IS IMPORTANT TO CONTINUE TO WALK EVERY HOUR AND USE THE INCENTIVE SPIROMETER EVERY HOUR.                                   You may not have any metal on your body including hair pins and              piercings  Do not wear jewelry, make-up, lotions, powders or perfumes,  deodorant             Do not wear nail polish.  Do not shave  48 hours prior to surgery.              Men may shave face and neck.   Do not bring valuables to the hospital. [place].  Contacts, dentures or bridgework may not be worn into surgery.  Leave suitcase in the car. After surgery it may be brought to your room.                Please read over the following fact sheets you were given: _____________________________________________________________________   CLEAR LIQUID DIET   Foods Allowed                                                                     Foods Excluded  Coffee and tea, regular and decaf                             liquids that you cannot  Plain Jell-O in any flavor                                             see through such as: Fruit ices (not with fruit pulp)                                     milk, soups, orange juice  Iced Popsicles                                    All solid food Carbonated beverages, regular and diet                                    Cranberry, grape and apple juices Sports drinks like Gatorade Lightly seasoned clear broth or consume(fat free) Sugar, honey syrup  Sample Menu Breakfast                                Lunch                                     Supper Cranberry juice                    Beef broth                            Chicken broth Jell-O                                     Grape juice                           Apple juice Coffee or tea                        Jell-O                                      Popsicle                                                Coffee or tea                        Coffee or tea  _____________________________________________________________________  How to Manage Your Diabetes Before and After Surgery  Why is it important to control my blood sugar before and after surgery? . Improving blood  sugar levels before and after surgery  helps healing and can limit problems. . A way of improving blood sugar control is eating a healthy diet by: o  Eating less sugar and carbohydrates o  Increasing activity/exercise o  Talking with your doctor about reaching your blood sugar goals . High blood sugars (greater than 180 mg/dL) can raise your risk of infections and slow your recovery, so you will need to focus on controlling your diabetes during the weeks before surgery. . Make sure that the doctor who takes care of your diabetes knows about your planned surgery including the date and location.  How do I manage my blood sugar before surgery? . Check your blood sugar at least 4 times a day, starting 2 days before surgery, to make sure that the level is not too high or low. o Check your blood sugar the morning of your surgery when you wake up and every 2 hours until you get to the Short Stay unit. . If your blood sugar is less than 70 mg/dL, you will need to treat for low blood sugar: o Do not take insulin. o Treat a low blood sugar (less than 70 mg/dL) with  cup of clear juice (cranberry or apple), 4 glucose tablets, OR glucose gel. o Recheck blood sugar in 15 minutes after treatment (to make sure it is greater than 70 mg/dL). If your blood sugar is not greater than 70 mg/dL on recheck, call [phone] for further instructions. . Report your blood sugar to the short stay nurse when you get to Short Stay.  . If you are admitted to the hospital after surgery: o Your blood sugar will be checked by the staff and you will probably be given insulin after surgery (instead of oral diabetes medicines) to make sure you have good blood sugar levels. o The goal for blood sugar control after surgery is 80-180 mg/dL.   WHAT DO I DO ABOUT MY DIABETES MEDICATION?  . THE DAY BEFORE SURGERY, take INSULIN GLARGINE(LANTUS) as normal      . THE MORNING OF SURGERY, ONLY TAKE 1/2 YOUR NORMA DOSE OF INSULIN GLARGINE(LANTUS)    Patient  Signature:  Date:   Nurse Signature:  Date:   Reviewed and Endorsed by [place] Patient Education Committee, August 2015   [facility] - Preparing for Surgery Before surgery, you can play an important role.  Because skin is not sterile, your skin needs to be as free of germs as possible.  You can reduce the number of germs on your skin by washing with CHG (chlorahexidine gluconate) soap before surgery.  CHG is an antiseptic cleaner which kills germs and bonds with the skin to continue killing germs even after washing. Please DO NOT use if you have an allergy to CHG or antibacterial soaps.  If your skin becomes reddened/irritated stop using the CHG and inform your nurse when you arrive at Short Stay. Do not shave (including legs and underarms) for at least 48 hours prior to the first CHG shower.  You may shave your face/neck. Please follow these instructions carefully:  1.  Shower with CHG Soap the night before surgery and the  morning of Surgery.  2.  If you choose to wash your hair, wash your hair first as usual with your  normal  shampoo.  3.  After you shampoo, rinse your hair and body thoroughly to remove the  shampoo.  4.  Use CHG as you would any other liquid soap.  You can apply chg directly  to the skin and wash                       Gently with a scrungie or clean washcloth.  5.  Apply the CHG Soap to your body ONLY FROM THE NECK DOWN.   Do not use on face/ open                           Wound or open sores. Avoid contact with eyes, ears mouth and genitals (private parts).                       Wash face,  Genitals (private parts) with your normal soap.             6.  Wash thoroughly, paying special attention to the area where your surgery  will be performed.  7.  Thoroughly rinse your body with warm water from the neck down.  8.  DO NOT shower/wash with your normal soap after using and rinsing off  the CHG Soap.                9.  Pat yourself dry with a  clean towel.            10.  Wear clean pajamas.            11.  Place clean sheets on your bed the night of your first shower and do not  sleep with pets. Day of Surgery : Do not apply any lotions/deodorants the morning of surgery.  Please wear clean clothes to the hospital/surgery center.  FAILURE TO FOLLOW THESE INSTRUCTIONS MAY RESULT IN THE CANCELLATION OF YOUR SURGERY PATIENT SIGNATURE_________________________________  NURSE SIGNATURE__________________________________  ________________________________________________________________________   Adam Phenix  An incentive spirometer is a tool that can help keep your lungs clear and active. This tool measures how well you are filling your lungs with each breath. Taking long deep breaths may help reverse or decrease the chance of developing breathing (pulmonary) problems (especially infection) following:  A long period of time when you are unable to move or be active. BEFORE THE PROCEDURE   If the spirometer includes an indicator to show your best effort, your nurse or respiratory therapist will set it to a desired goal.  If possible, sit up straight or lean slightly forward. Try not to slouch.  Hold the incentive spirometer in an upright position. INSTRUCTIONS FOR USE  1. Sit on the edge of your bed if possible, or sit up as far as you can in bed or on a chair. 2. Hold the incentive spirometer in an upright position. 3. Breathe out normally. 4. Place the mouthpiece in your mouth and seal your lips tightly around it. 5. Breathe in slowly and as deeply as possible, raising the piston or the ball toward the top of the column. 6. Hold your breath for 3-5 seconds or for as long as possible. Allow the piston or ball to fall to the bottom of the column. 7. Remove the mouthpiece from your mouth and breathe out normally. 8. Rest for a few seconds and repeat Steps 1 through 7 at least 10 times every 1-2 hours when you are awake.  Take your time and take a few normal breaths between deep breaths. 9. The spirometer may include an indicator to  show your best effort. Use the indicator as a goal to work toward during each repetition. 10. After each set of 10 deep breaths, practice coughing to be sure your lungs are clear. If you have an incision (the cut made at the time of surgery), support your incision when coughing by placing a pillow or rolled up towels firmly against it. Once you are able to get out of bed, walk around indoors and cough well. You may stop using the incentive spirometer when instructed by your caregiver.  RISKS AND COMPLICATIONS  Take your time so you do not get dizzy or light-headed.  If you are in pain, you may need to take or ask for pain medication before doing incentive spirometry. It is harder to take a deep breath if you are having pain. AFTER USE  Rest and breathe slowly and easily.  It can be helpful to keep track of a log of your progress. Your caregiver can provide you with a simple table to help with this. If you are using the spirometer at home, follow these instructions: [place] IF:   You are having difficultly using the spirometer.  You have trouble using the spirometer as often as instructed.  Your pain medication is not giving enough relief while using the spirometer.  You develop fever of 100.5 F (38.1 C) or higher. SEEK IMMEDIATE MEDICAL CARE IF:   You cough up bloody sputum that had not been present before.  You develop fever of 102 F (38.9 C) or greater.  You develop worsening pain at or near the incision site. MAKE SURE YOU:   Understand these instructions.  Will watch your condition.  Will get help right away if you are not doing well or get worse. Document Released: 09/17/2006 Document Revised: 07/30/2011 Document Reviewed: 11/18/2006 ExitCare Patient Information 2014 ExitCare,  [place].   ________________________________________________________________________  WHAT IS A BLOOD TRANSFUSION? Blood Transfusion Information  A transfusion is the replacement of blood or some of its parts. Blood is made up of multiple cells which provide different functions.  Red blood cells carry oxygen and are used for blood loss replacement.  White blood cells fight against infection.  Platelets control bleeding.  Plasma helps clot blood.  Other blood products are available for specialized needs, such as hemophilia or other clotting disorders. BEFORE THE TRANSFUSION  Who gives blood for transfusions?   Healthy volunteers who are fully evaluated to make sure their blood is safe. This is blood bank blood. Transfusion therapy is the safest it has ever been in the practice of medicine. Before blood is taken from a donor, a complete history is taken to make sure that person has no history of diseases nor engages in risky social behavior (examples are intravenous drug use or sexual activity with multiple partners). The donor's travel history is screened to minimize risk of transmitting infections, such as malaria. The donated blood is tested for signs of infectious diseases, such as HIV and hepatitis. The blood is then tested to be sure it is compatible with you in order to minimize the chance of a transfusion reaction. If you or a relative donates blood, this is often done in anticipation of surgery and is not appropriate for emergency situations. It takes many days to process the donated blood. RISKS AND COMPLICATIONS Although transfusion therapy is very safe and saves many lives, the main dangers of transfusion include:   Getting an infectious disease.  Developing a transfusion reaction. This is an allergic reaction to  something in the blood you were given. Every precaution is taken to prevent this. The decision to have a blood transfusion has been considered carefully by your caregiver  before blood is given. Blood is not given unless the benefits outweigh the risks. AFTER THE TRANSFUSION  Right after receiving a blood transfusion, you will usually feel much better and more energetic. This is especially true if your red blood cells have gotten low (anemic). The transfusion raises the level of the red blood cells which carry oxygen, and this usually causes an energy increase.  The nurse administering the transfusion will monitor you carefully for complications. HOME CARE INSTRUCTIONS  No special instructions are needed after a transfusion. You may find your energy is better. Speak with your caregiver about any limitations on activity for underlying diseases you may have. SEEK MEDICAL CARE IF:   Your condition is not improving after your transfusion.  You develop redness or irritation at the intravenous (IV) site. SEEK IMMEDIATE MEDICAL CARE IF:  Any of the following symptoms occur over the next 12 hours:  Shaking chills.  You have a temperature by mouth above 102 F (38.9 C), not controlled by medicine.  Chest, back, or muscle pain.  People around you feel you are not acting correctly or are confused.  Shortness of breath or difficulty breathing.  Dizziness and fainting.  You get a rash or develop hives.  You have a decrease in urine output.  Your urine turns a dark color or changes to pink, red, or brown. Any of the following symptoms occur over the next 10 days:  You have a temperature by mouth above 102 F (38.9 C), not controlled by medicine.  Shortness of breath.  Weakness after normal activity.  The white part of the eye turns yellow (jaundice).  You have a decrease in the amount of urine or are urinating less often.  Your urine turns a dark color or changes to pink, red, or brown. Document Released: 05/04/2000 Document Revised: 07/30/2011 Document Reviewed: 12/22/2007 [facility] Patient Information 2014 [place],  [place].  _______________________________________________________________________

## 2017-08-08 NOTE — Progress Notes (Signed)
ECHO 05-24-17 epic  EKG 05-23-17 epic

## 2017-08-09 ENCOUNTER — Telehealth (HOSPITAL_COMMUNITY): Payer: Self-pay

## 2017-08-09 LAB — ABO/RH: ABO/RH(D): A POS

## 2017-08-09 NOTE — Progress Notes (Signed)
08-08-17 HGA1C result sent to Dr. Kieth Brightly for review.

## 2017-08-09 NOTE — Telephone Encounter (Signed)
Called patient to update on changes to Pre op education from previous attendance.  We went through pre op  [facility].  The electronic version for Pre op Power point sent to patient for reference.  Questions answered.

## 2017-08-12 ENCOUNTER — Inpatient Hospital Stay (HOSPITAL_COMMUNITY): Payer: BC Managed Care – PPO | Admitting: Registered Nurse

## 2017-08-12 ENCOUNTER — Other Ambulatory Visit: Payer: Self-pay

## 2017-08-12 ENCOUNTER — Inpatient Hospital Stay (HOSPITAL_COMMUNITY)
Admission: RE | Admit: 2017-08-12 | Discharge: 2017-08-14 | DRG: 620 | Disposition: A | Discharge status: Home / Self Care | Payer: BC Managed Care – PPO | Source: Ambulatory Visit | Attending: General Surgery | Admitting: General Surgery

## 2017-08-12 ENCOUNTER — Encounter (HOSPITAL_COMMUNITY): Payer: Self-pay | Admitting: *Deleted

## 2017-08-12 ENCOUNTER — Encounter (HOSPITAL_COMMUNITY)
Admission: RE | Disposition: A | Discharge status: Home / Self Care | Payer: Self-pay | Source: Ambulatory Visit | Attending: General Surgery

## 2017-08-12 DIAGNOSIS — I69354 Hemiplegia and hemiparesis following cerebral infarction affecting left non-dominant side: Secondary | ICD-10-CM

## 2017-08-12 DIAGNOSIS — E11311 Type 2 diabetes mellitus with unspecified diabetic retinopathy with macular edema: Secondary | ICD-10-CM | POA: Diagnosis present

## 2017-08-12 DIAGNOSIS — M8580 Other specified disorders of bone density and structure, unspecified site: Secondary | ICD-10-CM | POA: Diagnosis present

## 2017-08-12 DIAGNOSIS — Z9119 Patient's noncompliance with other medical treatment and regimen: Secondary | ICD-10-CM

## 2017-08-12 DIAGNOSIS — Z794 Long term (current) use of insulin: Secondary | ICD-10-CM

## 2017-08-12 DIAGNOSIS — Z6837 Body mass index (BMI) 37.0-37.9, adult: Secondary | ICD-10-CM | POA: Diagnosis not present

## 2017-08-12 DIAGNOSIS — I509 Heart failure, unspecified: Secondary | ICD-10-CM | POA: Diagnosis present

## 2017-08-12 DIAGNOSIS — Z7902 Long term (current) use of antithrombotics/antiplatelets: Secondary | ICD-10-CM | POA: Diagnosis not present

## 2017-08-12 DIAGNOSIS — J45909 Unspecified asthma, uncomplicated: Secondary | ICD-10-CM | POA: Diagnosis present

## 2017-08-12 DIAGNOSIS — G4730 Sleep apnea, unspecified: Secondary | ICD-10-CM | POA: Diagnosis present

## 2017-08-12 DIAGNOSIS — I110 Hypertensive heart disease with heart failure: Secondary | ICD-10-CM | POA: Diagnosis present

## 2017-08-12 DIAGNOSIS — Z79899 Other long term (current) drug therapy: Secondary | ICD-10-CM | POA: Diagnosis not present

## 2017-08-12 DIAGNOSIS — E785 Hyperlipidemia, unspecified: Secondary | ICD-10-CM | POA: Diagnosis present

## 2017-08-12 DIAGNOSIS — Z9049 Acquired absence of other specified parts of digestive tract: Secondary | ICD-10-CM | POA: Diagnosis not present

## 2017-08-12 DIAGNOSIS — Z888 Allergy status to other drugs, medicaments and biological substances status: Secondary | ICD-10-CM

## 2017-08-12 HISTORY — PX: GASTRIC ROUX-EN-Y: SHX5262

## 2017-08-12 LAB — GLUCOSE, CAPILLARY
GLUCOSE-CAPILLARY: 166 mg/dL — AB (ref 65–99)
GLUCOSE-CAPILLARY: 171 mg/dL — AB (ref 65–99)
GLUCOSE-CAPILLARY: 242 mg/dL — AB (ref 65–99)
Glucose-Capillary: 183 mg/dL — ABNORMAL HIGH (ref 65–99)
Glucose-Capillary: 179 mg/dL — ABNORMAL HIGH (ref 65–99)
Glucose-Capillary: 183 mg/dL — ABNORMAL HIGH (ref 65–99)

## 2017-08-12 LAB — HEMOGLOBIN AND HEMATOCRIT, BLOOD
HCT: 40.9 % (ref 36.0–46.0)
Hemoglobin: 13.4 g/dL (ref 12.0–15.0)

## 2017-08-12 LAB — TYPE AND SCREEN
ABO/RH(D): A POS
ANTIBODY SCREEN: NEGATIVE

## 2017-08-12 SURGERY — LAPAROSCOPIC ROUX-EN-Y GASTRIC BYPASS WITH UPPER ENDOSCOPY
Anesthesia: General | Site: Abdomen

## 2017-08-12 MED ORDER — DEXAMETHASONE SODIUM PHOSPHATE 10 MG/ML IJ SOLN
INTRAMUSCULAR | Status: DC | PRN
  Administered 2017-08-12: 8 mg via INTRAVENOUS

## 2017-08-12 MED ORDER — PROMETHAZINE HCL 25 MG/ML IJ SOLN: 6.2500 mg | INTRAMUSCULAR | Status: DC | PRN

## 2017-08-12 MED ORDER — LIDOCAINE 2% (20 MG/ML) 5 ML SYRINGE
INTRAMUSCULAR | Status: DC | PRN
  Administered 2017-08-12: 100 mg via INTRAVENOUS

## 2017-08-12 MED ORDER — LIDOCAINE HCL 2 % IJ SOLN
INTRAMUSCULAR | Status: AC
  Filled 2017-08-12: qty 20

## 2017-08-12 MED ORDER — APREPITANT 40 MG PO CAPS
40.0000 mg | ORAL_CAPSULE | ORAL | Status: AC
  Administered 2017-08-12: 40 mg via ORAL
  Filled 2017-08-12: qty 1

## 2017-08-12 MED ORDER — ROCURONIUM BROMIDE 10 MG/ML (PF) SYRINGE
PREFILLED_SYRINGE | INTRAVENOUS | Status: AC
  Filled 2017-08-12: qty 5

## 2017-08-12 MED ORDER — LACTATED RINGERS IR SOLN
Status: DC | PRN
  Administered 2017-08-12: 1000 mL

## 2017-08-12 MED ORDER — METOPROLOL SUCCINATE ER 50 MG PO TB24
100.0000 mg | ORAL_TABLET | Freq: Every day | ORAL | Status: DC
  Administered 2017-08-13 – 2017-08-14 (×2): 100 mg via ORAL
  Filled 2017-08-12 (×3): qty 2

## 2017-08-12 MED ORDER — INSULIN ASPART 100 UNIT/ML ~~LOC~~ SOLN
10.0000 [IU] | Freq: Once | SUBCUTANEOUS | Status: AC
  Administered 2017-08-12: 10 [IU] via SUBCUTANEOUS

## 2017-08-12 MED ORDER — MEPERIDINE HCL 50 MG/ML IJ SOLN: 6.2500 mg | INTRAMUSCULAR | Status: DC | PRN

## 2017-08-12 MED ORDER — ALBUTEROL SULFATE (2.5 MG/3ML) 0.083% IN NEBU: 2.5000 mg | INHALATION_SOLUTION | RESPIRATORY_TRACT | Status: DC | PRN

## 2017-08-12 MED ORDER — BUPIVACAINE LIPOSOME 1.3 % IJ SUSP
20.0000 mL | Freq: Once | INTRAMUSCULAR | Status: AC
  Administered 2017-08-12: 20 mL
  Filled 2017-08-12 (×2): qty 20

## 2017-08-12 MED ORDER — ONDANSETRON HCL 4 MG/2ML IJ SOLN
INTRAMUSCULAR | Status: DC | PRN
  Administered 2017-08-12: 4 mg via INTRAVENOUS

## 2017-08-12 MED ORDER — DEXAMETHASONE SODIUM PHOSPHATE 10 MG/ML IJ SOLN
INTRAMUSCULAR | Status: AC
  Filled 2017-08-12: qty 1

## 2017-08-12 MED ORDER — PANTOPRAZOLE SODIUM 40 MG IV SOLR
40.0000 mg | Freq: Every day | INTRAVENOUS | Status: DC
  Administered 2017-08-12 – 2017-08-13 (×2): 40 mg via INTRAVENOUS
  Filled 2017-08-12 (×2): qty 40

## 2017-08-12 MED ORDER — CHLORHEXIDINE GLUCONATE 4 % EX LIQD: 60.0000 mL | Freq: Once | CUTANEOUS | Status: DC

## 2017-08-12 MED ORDER — SODIUM CHLORIDE 0.9 % IV SOLN
INTRAVENOUS | Status: DC
  Administered 2017-08-12 – 2017-08-14 (×4): via INTRAVENOUS

## 2017-08-12 MED ORDER — OXYCODONE HCL 5 MG PO TABS: 5.0000 mg | ORAL_TABLET | Freq: Once | ORAL | Status: DC | PRN

## 2017-08-12 MED ORDER — HYDRALAZINE HCL 20 MG/ML IJ SOLN
10.0000 mg | INTRAMUSCULAR | Status: DC | PRN
  Filled 2017-08-12: qty 1

## 2017-08-12 MED ORDER — SCOPOLAMINE 1 MG/3DAYS TD PT72
1.0000 | MEDICATED_PATCH | TRANSDERMAL | Status: DC
  Administered 2017-08-12: 1.5 mg via TRANSDERMAL
  Filled 2017-08-12: qty 1

## 2017-08-12 MED ORDER — INSULIN ASPART 100 UNIT/ML ~~LOC~~ SOLN
0.0000 [IU] | Freq: Three times a day (TID) | SUBCUTANEOUS | Status: DC
  Administered 2017-08-12: 4 [IU] via SUBCUTANEOUS
  Administered 2017-08-13 – 2017-08-14 (×4): 3 [IU] via SUBCUTANEOUS

## 2017-08-12 MED ORDER — LOSARTAN POTASSIUM 50 MG PO TABS
100.0000 mg | ORAL_TABLET | Freq: Every day | ORAL | Status: DC
  Administered 2017-08-12 – 2017-08-14 (×3): 100 mg via ORAL
  Filled 2017-08-12 (×3): qty 2

## 2017-08-12 MED ORDER — INSULIN ASPART 100 UNIT/ML ~~LOC~~ SOLN: 0.0000 [IU] | Freq: Every day | SUBCUTANEOUS | Status: DC

## 2017-08-12 MED ORDER — ACETAMINOPHEN 325 MG PO TABS
650.0000 mg | ORAL_TABLET | Freq: Four times a day (QID) | ORAL | Status: DC
  Filled 2017-08-12: qty 2

## 2017-08-12 MED ORDER — GABAPENTIN 300 MG PO CAPS
300.0000 mg | ORAL_CAPSULE | ORAL | Status: AC
  Administered 2017-08-12: 300 mg via ORAL
  Filled 2017-08-12: qty 1

## 2017-08-12 MED ORDER — OXYCODONE HCL 5 MG/5ML PO SOLN: 5.0000 mg | ORAL | Status: DC | PRN

## 2017-08-12 MED ORDER — EPHEDRINE SULFATE-NACL 50-0.9 MG/10ML-% IV SOSY
PREFILLED_SYRINGE | INTRAVENOUS | Status: DC | PRN
  Administered 2017-08-12: 5 mg via INTRAVENOUS
  Administered 2017-08-12: 10 mg via INTRAVENOUS
  Administered 2017-08-12: 5 mg via INTRAVENOUS

## 2017-08-12 MED ORDER — ONDANSETRON HCL 4 MG/2ML IJ SOLN
INTRAMUSCULAR | Status: AC
Start: 2017-08-12 — End: 2017-08-12
  Filled 2017-08-12: qty 2

## 2017-08-12 MED ORDER — DEXAMETHASONE SODIUM PHOSPHATE 4 MG/ML IJ SOLN: 4.0000 mg | INTRAMUSCULAR | Status: DC

## 2017-08-12 MED ORDER — AMLODIPINE BESYLATE 10 MG PO TABS
10.0000 mg | ORAL_TABLET | Freq: Every day | ORAL | Status: DC
  Filled 2017-08-12: qty 1

## 2017-08-12 MED ORDER — HEPARIN SODIUM (PORCINE) 5000 UNIT/ML IJ SOLN
5000.0000 [IU] | INTRAMUSCULAR | Status: AC
  Administered 2017-08-12: 5000 [IU] via SUBCUTANEOUS
  Filled 2017-08-12: qty 1

## 2017-08-12 MED ORDER — FENTANYL CITRATE (PF) 250 MCG/5ML IJ SOLN
INTRAMUSCULAR | Status: AC
  Filled 2017-08-12: qty 5

## 2017-08-12 MED ORDER — ONDANSETRON HCL 4 MG/2ML IJ SOLN: 4.0000 mg | INTRAMUSCULAR | Status: DC | PRN

## 2017-08-12 MED ORDER — MIDAZOLAM HCL 2 MG/2ML IJ SOLN
INTRAMUSCULAR | Status: AC
  Filled 2017-08-12: qty 2

## 2017-08-12 MED ORDER — PROPOFOL 10 MG/ML IV BOLUS
INTRAVENOUS | Status: AC
  Filled 2017-08-12: qty 20

## 2017-08-12 MED ORDER — ACETAMINOPHEN 160 MG/5ML PO SOLN
650.0000 mg | Freq: Four times a day (QID) | ORAL | Status: DC
  Administered 2017-08-12 – 2017-08-14 (×7): 650 mg via ORAL
  Filled 2017-08-12 (×7): qty 20.3

## 2017-08-12 MED ORDER — MIDAZOLAM HCL 5 MG/5ML IJ SOLN
INTRAMUSCULAR | Status: DC | PRN
  Administered 2017-08-12: 2 mg via INTRAVENOUS

## 2017-08-12 MED ORDER — ENOXAPARIN SODIUM 30 MG/0.3ML ~~LOC~~ SOLN
30.0000 mg | Freq: Two times a day (BID) | SUBCUTANEOUS | Status: DC
  Administered 2017-08-12 – 2017-08-14 (×5): 30 mg via SUBCUTANEOUS
  Filled 2017-08-12 (×4): qty 0.3

## 2017-08-12 MED ORDER — CEFOTETAN DISODIUM-DEXTROSE 2-2.08 GM-%(50ML) IV SOLR
2.0000 g | INTRAVENOUS | Status: AC
  Administered 2017-08-12: 2 g via INTRAVENOUS
  Filled 2017-08-12: qty 50

## 2017-08-12 MED ORDER — SUGAMMADEX SODIUM 500 MG/5ML IV SOLN
INTRAVENOUS | Status: AC
  Filled 2017-08-12: qty 5

## 2017-08-12 MED ORDER — LACTATED RINGERS IV SOLN
INTRAVENOUS | Status: DC | PRN
  Administered 2017-08-12: 07:00:00 via INTRAVENOUS

## 2017-08-12 MED ORDER — INSULIN ASPART 100 UNIT/ML ~~LOC~~ SOLN
SUBCUTANEOUS | Status: AC
  Filled 2017-08-12: qty 1

## 2017-08-12 MED ORDER — ROCURONIUM BROMIDE 10 MG/ML (PF) SYRINGE
PREFILLED_SYRINGE | INTRAVENOUS | Status: DC | PRN
  Administered 2017-08-12: 50 mg via INTRAVENOUS
  Administered 2017-08-12: 10 mg via INTRAVENOUS

## 2017-08-12 MED ORDER — PROPOFOL 10 MG/ML IV BOLUS
INTRAVENOUS | Status: DC | PRN
  Administered 2017-08-12: 200 mg via INTRAVENOUS

## 2017-08-12 MED ORDER — LIDOCAINE 2% (20 MG/ML) 5 ML SYRINGE
INTRAMUSCULAR | Status: AC
  Filled 2017-08-12: qty 5

## 2017-08-12 MED ORDER — SIMETHICONE 80 MG PO CHEW: 80.0000 mg | CHEWABLE_TABLET | Freq: Four times a day (QID) | ORAL | Status: DC | PRN

## 2017-08-12 MED ORDER — MORPHINE SULFATE (PF) 2 MG/ML IV SOLN: 1.0000 mg | INTRAVENOUS | Status: DC | PRN

## 2017-08-12 MED ORDER — SUGAMMADEX SODIUM 200 MG/2ML IV SOLN
INTRAVENOUS | Status: DC | PRN
  Administered 2017-08-12: 220 mg via INTRAVENOUS

## 2017-08-12 MED ORDER — BUPIVACAINE HCL (PF) 0.25 % IJ SOLN
INTRAMUSCULAR | Status: AC
  Filled 2017-08-12: qty 30

## 2017-08-12 MED ORDER — ACETAMINOPHEN 500 MG PO TABS
1000.0000 mg | ORAL_TABLET | ORAL | Status: AC
  Administered 2017-08-12: 1000 mg via ORAL
  Filled 2017-08-12: qty 2

## 2017-08-12 MED ORDER — BUPIVACAINE HCL 0.25 % IJ SOLN
INTRAMUSCULAR | Status: DC | PRN
Start: 2017-08-12 — End: 2017-08-12
  Administered 2017-08-12: 30 mL

## 2017-08-12 MED ORDER — LIDOCAINE 2% (20 MG/ML) 5 ML SYRINGE
INTRAMUSCULAR | Status: DC | PRN
  Administered 2017-08-12: 1 mg/kg/h via INTRAVENOUS

## 2017-08-12 MED ORDER — 0.9 % SODIUM CHLORIDE (POUR BTL) OPTIME
TOPICAL | Status: DC | PRN
  Administered 2017-08-12: 1000 mL

## 2017-08-12 MED ORDER — OXYCODONE HCL 5 MG/5ML PO SOLN
5.0000 mg | Freq: Once | ORAL | Status: DC | PRN
  Filled 2017-08-12: qty 5

## 2017-08-12 MED ORDER — HYDROMORPHONE HCL 1 MG/ML IJ SOLN: 0.2500 mg | INTRAMUSCULAR | Status: DC | PRN

## 2017-08-12 MED ORDER — AMLODIPINE BESYLATE 10 MG PO TABS
10.0000 mg | ORAL_TABLET | Freq: Every day | ORAL | Status: DC
  Administered 2017-08-12 – 2017-08-14 (×3): 10 mg via ORAL
  Filled 2017-08-12 (×2): qty 1

## 2017-08-12 MED ORDER — FENTANYL CITRATE (PF) 100 MCG/2ML IJ SOLN
INTRAMUSCULAR | Status: DC | PRN
  Administered 2017-08-12 (×2): 50 ug via INTRAVENOUS

## 2017-08-12 MED ORDER — PREMIER PROTEIN SHAKE
2.0000 [oz_av] | ORAL | Status: DC
  Administered 2017-08-13 – 2017-08-14 (×10): 2 [oz_av] via ORAL

## 2017-08-12 MED ORDER — EPHEDRINE 5 MG/ML INJ
INTRAVENOUS | Status: AC
  Filled 2017-08-12: qty 10

## 2017-08-12 SURGICAL SUPPLY — 72 items
APL SKNCLS STERI-STRIP NONHPOA (GAUZE/BANDAGES/DRESSINGS) ×1
APPLIER CLIP 5 13 M/L LIGAMAX5 (MISCELLANEOUS)
APPLIER CLIP ROT 10 11.4 M/L (STAPLE)
APPLIER CLIP ROT 13.4 12 LRG (CLIP)
APR CLP LRG 13.4X12 ROT 20 MLT (CLIP)
APR CLP MED LRG 11.4X10 (STAPLE)
APR CLP MED LRG 5 ANG JAW (MISCELLANEOUS)
BANDAGE ADH SHEER 1  50/CT (GAUZE/BANDAGES/DRESSINGS) ×6 IMPLANT
BENZOIN TINCTURE PRP APPL 2/3 (GAUZE/BANDAGES/DRESSINGS) ×1 IMPLANT
BLADE SURG SZ11 CARB STEEL (BLADE) ×2 IMPLANT
CABLE HIGH FREQUENCY MONO STRZ (ELECTRODE) ×2 IMPLANT
CHLORAPREP W/TINT 26ML (MISCELLANEOUS) ×2 IMPLANT
CLIP APPLIE 5 13 M/L LIGAMAX5 (MISCELLANEOUS) IMPLANT
CLIP APPLIE ROT 10 11.4 M/L (STAPLE) IMPLANT
CLIP APPLIE ROT 13.4 12 LRG (CLIP) IMPLANT
COVER SURGICAL LIGHT HANDLE (MISCELLANEOUS) ×2 IMPLANT
DEVICE SUTURE ENDOST 10MM (ENDOMECHANICALS) ×2 IMPLANT
DRAIN CHANNEL 19F RND (DRAIN) IMPLANT
DRAIN PENROSE 18X1/4 LTX STRL (WOUND CARE) ×2 IMPLANT
ELECT L-HOOK LAP 45CM DISP (ELECTROSURGICAL) ×4
ELECT PENCIL ROCKER SW 15FT (MISCELLANEOUS) ×3 IMPLANT
ELECTRODE L-HOOK LAP 45CM DISP (ELECTROSURGICAL) ×1 IMPLANT
EVACUATOR SILICONE 100CC (DRAIN) IMPLANT
GAUZE SPONGE 4X4 12PLY STRL (GAUZE/BANDAGES/DRESSINGS) IMPLANT
GAUZE SPONGE 4X4 16PLY XRAY LF (GAUZE/BANDAGES/DRESSINGS) ×2 IMPLANT
GLOVE BIOGEL PI IND STRL 7.0 (GLOVE) ×1 IMPLANT
GLOVE BIOGEL PI INDICATOR 7.0 (GLOVE) ×1
GLOVE SURG SS PI 7.0 STRL IVOR (GLOVE) ×2 IMPLANT
GOWN STRL REUS W/TWL LRG LVL3 (GOWN DISPOSABLE) ×2 IMPLANT
GOWN STRL REUS W/TWL XL LVL3 (GOWN DISPOSABLE) ×6 IMPLANT
GRASPER SUT TROCAR 14GX15 (MISCELLANEOUS) IMPLANT
HANDLE STAPLE EGIA 4 XL (STAPLE) ×2 IMPLANT
HOVERMATT SINGLE USE (MISCELLANEOUS) ×2 IMPLANT
KIT BASIN OR (CUSTOM PROCEDURE TRAY) ×2 IMPLANT
KIT GASTRIC LAVAGE 34FR ADT (SET/KITS/TRAYS/PACK) IMPLANT
MARKER SKIN DUAL TIP RULER LAB (MISCELLANEOUS) ×2 IMPLANT
NDL SPNL 22GX3.5 QUINCKE BK (NEEDLE) ×1 IMPLANT
NEEDLE SPNL 22GX3.5 QUINCKE BK (NEEDLE) ×2 IMPLANT
PACK CARDIOVASCULAR III (CUSTOM PROCEDURE TRAY) ×2 IMPLANT
RELOAD EGIA 45 MED/THCK PURPLE (STAPLE) ×2 IMPLANT
RELOAD EGIA 45 TAN VASC (STAPLE) IMPLANT
RELOAD EGIA 60 MED/THCK PURPLE (STAPLE) ×8 IMPLANT
RELOAD EGIA 60 TAN VASC (STAPLE) ×7 IMPLANT
RELOAD ENDO STITCH 2.0 (ENDOMECHANICALS) ×20
RELOAD STAPLE 60 MED/THCK ART (STAPLE) ×4 IMPLANT
RELOAD SUT SNGL STCH ABSRB 2-0 (ENDOMECHANICALS) ×5 IMPLANT
SCISSORS LAP 5X45 EPIX DISP (ENDOMECHANICALS) ×2 IMPLANT
SHEARS HARMONIC ACE PLUS 45CM (MISCELLANEOUS) ×2 IMPLANT
SLEEVE XCEL OPT CAN 5 100 (ENDOMECHANICALS) ×6 IMPLANT
SOLUTION ANTI FOG 6CC (MISCELLANEOUS) ×2 IMPLANT
STAPLER VISISTAT 35W (STAPLE) IMPLANT
STRIP CLOSURE SKIN 1/2X4 (GAUZE/BANDAGES/DRESSINGS) ×1 IMPLANT
SUT ETHIBOND 0 36 GRN (SUTURE) ×1 IMPLANT
SUT ETHILON 2 0 PS N (SUTURE) IMPLANT
SUT MNCRL AB 4-0 PS2 18 (SUTURE) ×2 IMPLANT
SUT RELOAD ENDO STITCH 2 48X1 (ENDOMECHANICALS) ×5
SUT RELOAD ENDO STITCH 2.0 (ENDOMECHANICALS) ×5
SUT SILK 0 SH 30 (SUTURE) IMPLANT
SUT VICRYL 0 TIES 12 18 (SUTURE) IMPLANT
SUTURE RELOAD END STTCH 2 48X1 (ENDOMECHANICALS) ×5 IMPLANT
SUTURE RELOAD ENDO STITCH 2.0 (ENDOMECHANICALS) ×5 IMPLANT
SYR 20CC LL (SYRINGE) ×2 IMPLANT
SYR 50ML LL SCALE MARK (SYRINGE) ×2 IMPLANT
TOWEL OR 17X26 10 PK STRL BLUE (TOWEL DISPOSABLE) ×2 IMPLANT
TOWEL OR NON WOVEN STRL DISP B (DISPOSABLE) ×2 IMPLANT
TRAY FOLEY W/METER SILVER 14FR (SET/KITS/TRAYS/PACK) IMPLANT
TRAY FOLEY W/METER SILVER 16FR (SET/KITS/TRAYS/PACK) IMPLANT
TROCAR BLADELESS OPT 5 100 (ENDOMECHANICALS) ×2 IMPLANT
TROCAR XCEL 12X100 BLDLESS (ENDOMECHANICALS) ×2 IMPLANT
TUBING CONNECTING 10 (TUBING) ×2 IMPLANT
TUBING ENDO SMARTCAP PENTAX (MISCELLANEOUS) ×2 IMPLANT
TUBING INSUF HEATED (TUBING) ×2 IMPLANT

## 2017-08-12 NOTE — Op Note (Signed)
Rebekah Kennedy [id number] [birth year] 08/12/2017  Preoperative diagnosis: morbid obesity  Postoperative diagnosis: Same   Procedure: Upper endoscopy   Surgeon: Gayland Curry M.D., FACS   Anesthesia: Gen.   Indications for procedure: 62 y.o. yo female undergoing a laparoscopic roux en y gastric bypass and an upper endoscopy was requested to evaluate the anastomosis.  Description of procedure: After we have completed the new gastrojejunostomy, I scrubbed out and obtained the Olympus endoscope. I gently placed endoscope in the patient's oropharynx and gently glided it down the esophagus without any difficulty under direct visualization. Once I was in the gastric pouch, I insufflated the pouch was air. The pouch was approximately 4.5 cm in size. I was able to cannulate and advanced the scope through the gastrojejunostomy. Dr.[name] had placed saline in the upper abdomen. Upon further insufflation of the gastric pouch there was no evidence of bubbles. Upon further inspection of the gastric pouch, the mucosa appeared normal. There is no evidence of any mucosal abnormality. The gastric pouch and Roux limb were decompressed. The width of the gastrojejunal anastomosis was at least 2 cm. The scope was withdrawn. The patient tolerated this portion of the procedure well. Please see Dr Amie Portland operative note for details regarding the laparoscopic roux-en-y gastric bypass.  Leighton Ruff. Redmond Pulling, MD, FACS General, Bariatric, & Minimally Invasive Surgery [facility], [place]

## 2017-08-12 NOTE — Anesthesia Preprocedure Evaluation (Signed)
Anesthesia Evaluation    Reviewed: Allergy & Precautions, Patient's Chart, lab work & pertinent test results, reviewed documented beta blocker date and time   Airway Mallampati: I       Dental no notable dental hx. (+) Teeth Intact   Pulmonary asthma , sleep apnea ,    Pulmonary exam normal breath sounds clear to auscultation       Cardiovascular hypertension, Pt. on medications and Pt. on home beta blockers Normal cardiovascular exam Rhythm:Regular Rate:Normal     Neuro/Psych Depression CVA, Residual Symptoms    GI/Hepatic   Endo/Other  diabetes, Type 2, Insulin DependentMorbid obesity  Renal/GU      Musculoskeletal negative musculoskeletal ROS (+)   Abdominal (+) + obese,   Peds  Hematology   Anesthesia Other Findings Rebekah Kennedy  ECHO COMPLETE WO IMAGING ENHANCING AGENT  Order# [id number]  Reading physician: Jerline Pain, MD Ordering physician: [name], MD Study date: 05/24/17 Study Result   Result status: Final result                             *[facility]*                          [address]                        [place], [place] [address]                            [phone]  ------------------------------------------------------------------- Transthoracic Echocardiography  Patient:    Rebekah Kennedy, Rebekah Kennedy MR #:       [id number] Study Date: 05/24/2017 Gender:     F Age:        62 Height:     168 cm Weight:     112 kg BSA:        2.34 m^2 Pt. Status: Room:       [address], Rebekah Kennedy, [place] M  REFERRING    Rebekah Kennedy, [place], Inpatient  SONOGRAPHER  Rebekah Kennedy, RDCS  cc:  ------------------------------------------------------------------- LV EF: 60% -    65%  ------------------------------------------------------------------- Indications:      Chest pain 786.51.  ------------------------------------------------------------------- History:   PMH:   Transient ischemic attack.  Stroke.  Risk factors:  Hypertension. Dyslipidemia.  ------------------------------------------------------------------- Study Conclusions  - Left ventricle: The cavity size was normal. Wall thickness was   normal. Systolic function was normal. The estimated ejection   fraction was in the range of 60% to 65%. Wall motion was normal;   there were no regional wall motion abnormalities. Features are   consistent with a pseudonormal left ventricular filling pattern,   with concomitant abnormal relaxation and increased filling   pressure (grade 2 diastolic dysfunction). - Mitral valve: Mildly thickened leaflets .  ------------------------------------------------------------------- Study data:  Comparison was made to the study of 02/22/2011.  Study status    Reproductive/Obstetrics  Anesthesia Physical  Anesthesia Plan  ASA: III  Anesthesia Plan: General   Post-op Pain Management:    Induction: Intravenous  PONV Risk Score and Plan: 3 and Ondansetron, Dexamethasone and Midazolam  Airway Management Planned: Oral ETT  Additional Equipment:   Intra-op Plan:   Post-operative Plan: Extubation in OR  Informed Consent: I have reviewed the patients History and Physical, chart, labs and discussed the procedure including the risks, benefits and alternatives for the proposed anesthesia with the patient or authorized representative who has indicated his/her understanding and acceptance.     Plan Discussed with: CRNA and Surgeon  Anesthesia Plan Comments:         Anesthesia Quick Evaluation

## 2017-08-12 NOTE — Anesthesia Procedure Notes (Signed)
Procedure Name: Intubation Date/Time: 08/12/2017 7:28 AM Performed by: Victoriano Lain, CRNA Pre-anesthesia Checklist: Patient identified, Emergency Drugs available, Suction available, Patient being monitored and Timeout performed Patient Re-evaluated:Patient Re-evaluated prior to induction Oxygen Delivery Method: Circle system utilized Preoxygenation: Pre-oxygenation with 100% oxygen Induction Type: IV induction Ventilation: Mask ventilation without difficulty Laryngoscope Size: Mac and 4 Grade View: Grade I Tube type: Oral Tube size: 7.5 mm Number of attempts: 1 Airway Equipment and Method: Stylet Placement Confirmation: ETT inserted through vocal cords under direct vision,  positive ETCO2 and breath sounds checked- equal and bilateral Secured at: 21 cm Tube secured with: Tape Dental Injury: Teeth and Oropharynx as per pre-operative assessment

## 2017-08-12 NOTE — Progress Notes (Signed)
Offered water to patient; she refused it. Remains drowsy. Was able to use Incentive Spirometer three times but will need reinforcement. Donne Hazel, RN

## 2017-08-12 NOTE — Anesthesia Postprocedure Evaluation (Signed)
Anesthesia Post Note  Patient: Rebekah Kennedy  Procedure(s) Performed: LAPAROSCOPIC ROUX-EN-Y GASTRIC BYPASS WITH HIATAL HERNIA REPAIR AND UPPER ENDOSCOPY (N/A Abdomen)     Patient location during evaluation: PACU Anesthesia Type: General Level of consciousness: awake and alert Pain management: pain level controlled Vital Signs Assessment: post-procedure vital signs reviewed and stable Respiratory status: spontaneous breathing, nonlabored ventilation and respiratory function stable Cardiovascular status: blood pressure returned to baseline and stable Postop Assessment: no apparent nausea or vomiting Anesthetic complications: no    Last Vitals:  Vitals:   08/12/17 1100 08/12/17 1119  BP: (!) 155/83 (!) 151/86  Pulse: 84 82  Resp: (!) 21 20  Temp:  36.4 C  SpO2: 100% 100%    Last Pain:  Vitals:   08/12/17 1100  TempSrc:   PainSc: Asleep                 Lynda Rainwater

## 2017-08-12 NOTE — Op Note (Addendum)
Preop Diagnosis: Obesity Class III  Postop Diagnosis: same  Procedure performed: laparoscopic Roux en Y gastric bypass  Assitant: Greer Pickerel  Indications:  The patient is a 62 y.o. year-old morbidly obese female who has been followed in the Bariatric Clinic as an outpatient. This patient was diagnosed with morbid obesity with a BMI of Body mass index is 37.67 kg/m. and significant co-morbidities including hypertension, non-insulin dependent diabetes and congestive heart failure.  The patient was counseled extensively in the Bariatric Outpatient Clinic and after a thorough explanation of the risks and benefits of surgery (including death from complications, bowel leak, infection such as peritonitis and/or sepsis, internal hernia, bleeding, need for blood transfusion, bowel obstruction, organ failure, pulmonary embolus, deep venous thrombosis, wound infection, incisional hernia, skin breakdown, and others entailed on the consent form) and after a compliant diet and exercise program, the patient was scheduled for an elective laparoscopic gastric bypass.  Description of Operation:  Following informed consent, the patient was taken to the operating room and placed on the operating table in the supine position.  She had previously received prophylactic antibiotics and subcutaneous heparin for DVT prophylaxis in the pre-op holding area.  After induction of general endotracheal anesthesia by the anesthesiologist, the patient underwent placement of sequential compression devices, Foley catheter and an oro-gastric tube.  A timeout was confirmed by the surgery and anesthesia teams.  The patient was adequately padded at all pressure points and placed on a footboard to prevent slippage from the OR table during extremes of position during surgery.  She underwent a routine sterile prep and drape of her entire abdomen.    Next, A transverse incision was made under the left subcostal area and a 75mm optical viewing  trocar was introduced into the peritoneal cavity. Pneumoperitoneum was applied with a high flow and low pressure. A laparoscope was inserted to confirm placement. A extraperitoneal block was then placed at the lateral abdominal wall using exparel diluted with marcaine . 5 additional trocars were placed: 1 32mm trocar to the left of the midline. 1 additional 43mm trocar in the left lateral area, 1 57mm trocar in the right mid abdomen, and 1 22mm trocar in the right subcostal area.  The greater omentum was flipped over the transverse colon and under the left lobe of the liver. The ligament of trietz was identified. 40cm of jejunum was measured starting from the ligament of Trietz. The mesentery was checked to ensure mobility. Next, a 72mm 2-24mm tristapler was used to divide the jejunum at this location. The harmonic scalpel was used to divide the mesentery down to the origin. A 1/2" penrose was sutured to the distal side. 100cm of jejunum was measured starting at the division. 2-0 silk was used to appose the biliary limb to the 100cm mark of jejunum in 2 places. Enterotomies were made in the biliary and common channels and a 55mm 2-3 tristapler was used to create the J-J anastomosis. A 2-0 silk was used to appose the enterotomy edges and a 631mm 2-3 tristapler was used to close the enterotomy. An anti-obstruction 2-0 silk suture was placed. Next, the mesenteric defect was closed with a 2-0 silk in running fashion.The J-J appeared patent and in neutral position.  Next, the omentum was divided using the Harmonic scalpel. The patient was placed in steep Reverse Trendelenberg position. A Nathanson retracted was placed through a subxiphoid incision and used to retract the liver. On initial view of the hiatus it appeared weak. Therefore, the pars flaccida was  incised with harmonic scalpel. The stomach was reduced but on dissection of the posterior crus there was a visible hernia with small sac. The sac was dissected free  and 1 0 ethibond sutures placed in interrupted fashion. A calibration tube was passed to ensure appropriate size of the hiatus.  The fat pad over the fundus was incised to free the fundus. Next, a position along the lesser curve 6cm from GE junction was identified. The pars flaccida was entered and the fat over the lesser curve divided to enter the lesser sac. Multiple 32mm 3-13mm tristaple firings were peformed to create a 6cm pouch. The Roux limb was identified using the placed penrose and brought up to the stomach in antecolic fashion. The limb was inspected to ensure a neutral position. A 2-0 vicryl suture was then used to create a posterior layer connecting the stomach to the Roux limb jejunum in running fashion. Next cautery was used to create an enterotomy along the medial aspect of this suture line and Harmonic scalpel used to create gastotomy. A 73mm 3-12mm tristapler was then used to create a 25-70mm anastomosis. 2 2-0 vicryl sutures were used in running fashion to close the gastrotomy. Finally, a 2-0 vicryl suture was used to close an anterior layer of stomach and jejunum over the anastomosis in running fashion. The penrose was removed from the Roux limb. A 2-0 vicryl was used to appose the transverse mesocolon to the mesentery of the Roux limb.   The assistant then went and performed an upper endoscopy and leak test. No bubbles were seen and the pouch and limb distended appropriately. The limb and pouch were deflated, the endoscope was removed. Hemostasis was ensured. Pneumoperitoneum was evacuated, all ports were removed and all incisions closed with 4-0 monocryl suture in subcuticular fashion. Steristrips and bandaids were put in place for dressing. The patient awoke from anesthesia and was brought to pacu in stable condition. All counts were correct.  Specimens:  None  Estimated Blood Loss: <40 ml  Local Anesthesia: 50 ml Exparel: 0.5% Marcaine Mix  Post-Op Plan:       Pain Management:  PO, prn      Antibiotics: Prophylactic      Anticoagulation: Prophylactic, Starting now      Post Op Studies/Consults: Not applicable      Intended Discharge: within 48h      Intended Outpatient Follow-Up: Two Week      Intended Outpatient Studies: Not Applicable      Other: Not Applicable   [name] Allon Costlow

## 2017-08-12 NOTE — H&P (Signed)
Rebekah Kennedy is an 62 y.o. female.   Chief Complaint: obesity HPI: 62 yo female with sleep apnea, diabetes and type II obesity. SHe recently underwent lumpectomy and is still having small amount of drainage at the incision  Past Medical History:  Diagnosis Date  . ASTHMA NOS W/ACUTE EXACERBATION 07/10/2010  . Cervical dysplasia   . DEPRESSION 03/16/2007  . DIABETES MELLITUS, TYPE II 12/01/2006  . Dysmenorrhea   . Fibroid   . HYPERLIPIDEMIA 12/01/2006  . HYPERTENSION 07/07/2006  . Obesity   . Osteopenia 05/2017   T score -1.5 FRAX 2.6% / 0.1%  . Retinal edema    gets steriod inj in the eyes    . Sleep apnea    HAS CPAP BUT ADMITS DOES NOT USE JUDICIALLY   . Splenomegaly    in college  . Stroke [facility]) 2012   R pontine, mild residual left hemiparesis  . TIA (transient ischemic attack) 2012   2 TIAs 1 week apart of each other     Past Surgical History:  Procedure Laterality Date  . 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 APPENDECTOMY    . BIOPSY THYROID  05/02/11   Nonneoplastic goiter  . BREAST BIOPSY    . BREAST LUMPECTOMY WITH RADIOACTIVE SEED LOCALIZATION Left 07/25/2017   Procedure: LEFT BREAST LUMPECTOMY WITH RADIOACTIVE SEED LOCALIZATION;  Surgeon: Coralie Keens, MD;  Location: [facility];  Service: General;  Laterality: Left;  . BREAST SURGERY     Reduction  . COLPOSCOPY    . DILATION AND CURETTAGE OF UTERUS  1975   DUB  . EYE SURGERY     Laser  . GYNECOLOGIC CRYOSURGERY    . MYOMECTOMY    . OVARIAN CYST REMOVAL    . PELVIC LAPAROSCOPY  75,88   DL lysis of adhesions    Family History  Problem Relation Age of Onset  . Cancer Maternal Grandmother        Colon Cancer  . Asthma Maternal Grandmother   . Diabetes Father   . Heart disease Father   . Hypertension Father   . Hyperlipidemia Father   . Hypertension Mother   . Heart disease Mother   . Ovarian cancer Mother   . Cancer Mother        Lung cancer  . Asthma Mother   . COPD Mother   . Hyperlipidemia Mother    . Diabetes Brother   . Hypertension Brother   . Kidney disease Brother   . Asthma Brother   . Heart disease Brother   . Hyperlipidemia Brother   . Cancer Brother        Prostate  . Graves' disease Sister   . Diabetes Sister   . Breast cancer Sister 35  . Graves' disease Paternal Grandmother   . Hypertension Paternal Grandmother   . Heart disease Paternal Grandmother   . Alzheimer's disease Paternal Grandmother   . Cancer Maternal Grandfather   . Heart failure Brother   . Heart failure Brother    Social History:  reports that she has never smoked. She has never used smokeless tobacco. She reports that she drinks alcohol. She reports that she does not use drugs.  Allergies:  Allergies  Allergen Reactions  . Quinapril Cough    Medications Prior to Admission  Medication Sig Dispense Refill  . albuterol (PROVENTIL HFA;VENTOLIN HFA) 108 (90 Base) MCG/ACT inhaler Inhale 2 puffs into the lungs every 4 (four) hours as needed for wheezing or shortness of breath. [place]  g 2  . albuterol (PROVENTIL) (2.5 MG/3ML) 0.083% nebulizer solution Take 3 mLs (2.5 mg total) by nebulization every 6 (six) hours as needed for wheezing or shortness of breath. 150 mL 1  . amLODipine (NORVASC) 10 MG tablet TAKE ONE TABLET BY MOUTH ONE TIME DAILY 90 tablet 1  . Biotin 5000 MCG TABS Take 5,000 mcg by mouth daily.     . Calcium Carb-Ergocalciferol (CHEWABLE CALCIUM/D PO) Take 1 tablet by mouth 3 (three) times daily.    . Carboxymethylcellul-Glycerin (LUBRICATING EYE DROPS OP) Place 1 drop into both eyes daily as needed (dry eyes).    . clopidogrel (PLAVIX) 75 MG tablet Take 1 tablet (75 mg total) by mouth daily. 90 tablet 3  . Insulin Glargine (BASAGLAR KWIKPEN) 100 UNIT/ML SOPN Inject 1.2 mLs (120 Units total) into the skin every morning. And pen needles 4/day 55 pen 11  . losartan (COZAAR) 100 MG tablet TAKE 1 TAB BY MOUTH ONCE DAILY. 90 tablet 1  . metoprolol succinate (TOPROL-XL) 100 MG 24 hr tablet TAKE 1  TABLET BY MOUTH DAILY WITH OR IMMEDIATELY FOLLOWING A MEAL 90 tablet 1  . montelukast (SINGULAIR) 10 MG tablet Take 1 tablet (10 mg total) by mouth at bedtime. (Patient taking differently: Take 10 mg by mouth daily. ) 90 tablet 3  . Multiple Vitamin (MULTIVITAMIN) tablet Take 1 tablet by mouth 2 (two) times daily.     Marland Kitchen oxyCODONE (OXY IR/ROXICODONE) 5 MG immediate release tablet Take 1-2 tablets (5-10 mg total) by mouth every 6 (six) hours as needed for moderate pain, severe pain or breakthrough pain. (Patient taking differently: Take 2.5-5 mg by mouth every 6 (six) hours as needed for moderate pain, severe pain or breakthrough pain. ) 20 tablet 0  . pravastatin (PRAVACHOL) 20 MG tablet Take 1 tablet (20 mg total) by mouth daily. (Patient taking differently: Take 20 mg by mouth at bedtime. ) 90 tablet 3  . spironolactone (ALDACTONE) 25 MG tablet Take 2 tablets (50 mg total) by mouth daily. 90 tablet 1  . BD ULTRA-FINE PEN NEEDLES 29G X 12.7MM MISC USE 3 TIMES DAILY AS DIRECTED 100 each 1  . blood glucose meter kit and supplies KIT One touch verio. Use up to four times daily as directed. (FOR ICD-9 250.00, 250.01). 1 each 0  . ondansetron (ZOFRAN) 4 MG tablet Take 4 mg by mouth every 8 (eight) hours as needed for nausea/vomiting.  1  . pantoprazole (PROTONIX) 40 MG tablet Take 40 mg by mouth daily.  2    Results for orders placed or performed during the hospital encounter of 08/12/17 (from the past 48 hour(s))  Glucose, capillary     Status: Abnormal   Collection Time: 08/12/17  6:26 AM  Result Value Ref Range   Glucose-Capillary 242 (H) 65 - 99 mg/dL   No results found.  Review of Systems  Constitutional: Negative for chills and fever.  HENT: Negative for hearing loss.   Eyes: Negative for blurred vision and double vision.  Respiratory: Negative for cough and hemoptysis.   Cardiovascular: Negative for chest pain and palpitations.  Gastrointestinal: Negative for abdominal pain, nausea and  vomiting.  Genitourinary: Negative for dysuria and urgency.  Musculoskeletal: Negative for myalgias and neck pain.  Skin: Negative for itching and rash.  Neurological: Negative for dizziness, tingling and headaches.  Endo/Heme/Allergies: Does not bruise/bleed easily.  Psychiatric/Behavioral: Negative for depression and suicidal ideas.    Blood pressure (!) 149/80, pulse 94, temperature 98.4 F (36.9 C), temperature  source Oral, resp. rate 18, height _0  (1.676 m), weight 105.9 kg (233 lb 6.4 oz), SpO2 96 %. Physical Exam  Vitals reviewed. Constitutional: She is oriented to person, place, and time. She appears well-developed and well-nourished.  HENT:  Head: Normocephalic and atraumatic.  Eyes: Pupils are equal, round, and reactive to light. Conjunctivae and EOM are normal.  Neck: Normal range of motion. Neck supple.  Cardiovascular: Normal rate and regular rhythm.  Respiratory: Effort normal and breath sounds normal.  GI: Soft. Bowel sounds are normal. She exhibits no distension. There is no tenderness.  Musculoskeletal: Normal range of motion.  Neurological: She is alert and oriented to person, place, and time.  Skin: Skin is warm and dry.  Left periareolar incision dry, no erythema  Psychiatric: She has a normal mood and affect. Her behavior is normal.     Assessment/Plan 62 yo female with obesity, sleep apnea, and diabetes presents for RNY gastric bypass -lap bypass -ERAS protocol -post op bariatric protocol  Mickeal Skinner, MD 08/12/2017, 7:12 AM

## 2017-08-12 NOTE — Progress Notes (Signed)
Inpatient Diabetes Program Recommendations  AACE/ADA: New Consensus Statement on Inpatient Glycemic Control (2015)  Target Ranges:  Prepandial:   less than 140 mg/dL      Peak postprandial:   less than 180 mg/dL (1-2 hours)      Critically ill patients:  140 - 180 mg/dL   Lab Results  Component Value Date   GLUCAP 171 (H) 08/12/2017   HGBA1C 8.6 (H) 08/08/2017    Review of Glycemic Control  Diabetes history: DM2 Outpatient Diabetes medications: Basaglar 120 units QAM Current orders for Inpatient glycemic control: Novolog 0-20 units tidwc and hs  HgbA1C - 8.6% Daughter states pt has lost 16 pounds in past 2 weeks.  Inpatient Diabetes Program Recommendations:     May need small amount of basal insulin (Basaglar) when discharged. Otherwise, would recommend going home on Novolog 0-20 units Q4H.  Spoke with pt and daughter regarding her glucose control. Instructed pt to check blood sugars at least  4x/day and record in logbook. Take logbook to MD for review. Call diabetes MD if blood sugars run > 200 mg/dL on a regular basis.   Will follow.  Thank you. Lorenda Peck, RD, LDN, CDE Inpatient Diabetes Coordinator [phone]

## 2017-08-12 NOTE — Progress Notes (Signed)
Discussed post op day goals with patient including ambulation, IS, diet progression, pain, and nausea control.  Questions answered. 

## 2017-08-12 NOTE — Transfer of Care (Signed)
Immediate Anesthesia Transfer of Care Note  Patient: Rebekah Kennedy  Procedure(s) Performed: LAPAROSCOPIC ROUX-EN-Y GASTRIC BYPASS WITH HIATAL HERNIA REPAIR AND UPPER ENDOSCOPY (N/A Abdomen)  Patient Location: PACU  Anesthesia Type:General  Level of Consciousness: awake, alert , oriented and patient cooperative  Airway & Oxygen Therapy: Patient Spontanous Breathing and Patient connected to face mask oxygen  Post-op Assessment: Report given to RN, Post -op Vital signs reviewed and stable and Patient moving all extremities  Post vital signs: Reviewed and stable  Last Vitals:  Vitals Value Taken Time  BP 158/86 08/12/2017 10:02 AM  Temp    Pulse 85 08/12/2017 10:04 AM  Resp 21 08/12/2017 10:04 AM  SpO2 100 % 08/12/2017 10:04 AM  Vitals shown include unvalidated device data.  Last Pain:  Vitals:   08/12/17 0628  TempSrc: Oral      Patients Stated Pain Goal: 5 (22/63/33 5456)  Complications: No apparent anesthesia complications

## 2017-08-13 ENCOUNTER — Encounter (HOSPITAL_COMMUNITY): Payer: Self-pay | Admitting: General Surgery

## 2017-08-13 LAB — CBC WITH DIFFERENTIAL/PLATELET
BASOS ABS: 0 10*3/uL (ref 0.0–0.1)
Basophils Relative: 0 %
EOS ABS: 0 10*3/uL (ref 0.0–0.7)
EOS PCT: 0 %
HEMATOCRIT: 39.5 % (ref 36.0–46.0)
Hemoglobin: 13 g/dL (ref 12.0–15.0)
Lymphocytes Relative: 16 %
Lymphs Abs: 1.7 10*3/uL (ref 0.7–4.0)
MCH: 27.4 pg (ref 26.0–34.0)
MCHC: 32.9 g/dL (ref 30.0–36.0)
MCV: 83.3 fL (ref 78.0–100.0)
MONOS PCT: 8 %
Monocytes Absolute: 0.9 10*3/uL (ref 0.1–1.0)
NEUTROS ABS: 8.4 10*3/uL — AB (ref 1.7–7.7)
Neutrophils Relative %: 76 %
PLATELETS: 340 10*3/uL (ref 150–400)
RBC: 4.74 MIL/uL (ref 3.87–5.11)
RDW: 14 % (ref 11.5–15.5)
WBC: 11 10*3/uL — ABNORMAL HIGH (ref 4.0–10.5)

## 2017-08-13 LAB — CBC
HEMATOCRIT: 40.4 % (ref 36.0–46.0)
HEMOGLOBIN: 13.2 g/dL (ref 12.0–15.0)
MCH: 27.3 pg (ref 26.0–34.0)
MCHC: 32.7 g/dL (ref 30.0–36.0)
MCV: 83.5 fL (ref 78.0–100.0)
Platelets: 343 10*3/uL (ref 150–400)
RBC: 4.84 MIL/uL (ref 3.87–5.11)
RDW: 14 % (ref 11.5–15.5)
WBC: 10.6 10*3/uL — ABNORMAL HIGH (ref 4.0–10.5)

## 2017-08-13 LAB — COMPREHENSIVE METABOLIC PANEL
ALBUMIN: 3.2 g/dL — AB (ref 3.5–5.0)
ALT: 145 U/L — ABNORMAL HIGH (ref 14–54)
ANION GAP: 9 (ref 5–15)
AST: 89 U/L — AB (ref 15–41)
Alkaline Phosphatase: 58 U/L (ref 38–126)
BILIRUBIN TOTAL: 0.6 mg/dL (ref 0.3–1.2)
BUN: 10 mg/dL (ref 6–20)
CHLORIDE: 103 mmol/L (ref 101–111)
CO2: 25 mmol/L (ref 22–32)
Calcium: 8.7 mg/dL — ABNORMAL LOW (ref 8.9–10.3)
Creatinine, Ser: 0.6 mg/dL (ref 0.44–1.00)
GFR calc Af Amer: >60 mL/min (ref >60)
GFR calc non Af Amer: >60 mL/min (ref >60)
GLUCOSE: 114 mg/dL — AB (ref 65–99)
POTASSIUM: 3.8 mmol/L (ref 3.5–5.1)
SODIUM: 137 mmol/L (ref 135–145)
Total Protein: 6.6 g/dL (ref 6.5–8.1)

## 2017-08-13 LAB — GLUCOSE, CAPILLARY
GLUCOSE-CAPILLARY: 124 mg/dL — AB (ref 65–99)
GLUCOSE-CAPILLARY: 123 mg/dL — AB (ref 65–99)
GLUCOSE-CAPILLARY: 140 mg/dL — AB (ref 65–99)
Glucose-Capillary: 147 mg/dL — ABNORMAL HIGH (ref 65–99)

## 2017-08-13 NOTE — Progress Notes (Signed)
Patient alert and oriented, Post op day 1.  Provided support and encouragement.  Encouraged pulmonary toilet, ambulation and small sips of liquids. Walked patient 30 feet in hallway became dizzy.  Patient stated she had similar reaction after recent surgery in regards to being sleepy.  All questions answered.  Will continue to monitor.

## 2017-08-13 NOTE — Progress Notes (Signed)
Patient sleeping, arouses to RN and follows commands.  Sipping water, Bedside RN reports only 4 ounces of clear fluid overnight. Will continue to monitor.

## 2017-08-13 NOTE — Progress Notes (Signed)
Started patient on protein shakes after patient completed 12 ounces of bari clear fluids.  Patient more awake sitting in chair.  No questions at this time.

## 2017-08-13 NOTE — Progress Notes (Signed)
Notified Dr. Lucia Gaskins of increased concerns regarding patient sleeping post-op from day shift and the first 2 hours of this shift.  Patient is easy to arouse.  She is able to follow command for a short period of time.  She is encouraged to use the IS, but she she falls asleep during the process.  Patient ambulates back and forth to the bathroom with physical guidance for safety.  Dr. Lucia Gaskins is aware of the vital signs and lab results.  Dr. Lucia Gaskins came to the patient's beside for further evaluation.

## 2017-08-13 NOTE — Progress Notes (Signed)
   Progress Note: Metabolic and Bariatric Surgery Service   Chief Complaint/Subjective: Sleepy, feelings of burping  Objective: Vital signs in last 24 hours: Temp:  [97.6 F (36.4 C)-98.5 F (36.9 C)] 98.3 F (36.8 C) (03/26 0847) Pulse Rate:  [76-90] 85 (03/26 0847) Resp:  [7-21] 18 (03/26 0847) BP: (150-181)/(72-97) 160/96 (03/26 0847) SpO2:  [98 %-100 %] 100 % (03/26 0847) Weight:  [105.8 kg (233 lb 4 oz)] 105.8 kg (233 lb 4 oz) (03/26 0600)    Intake/Output from previous day: 03/25 0701 - 03/26 0700 In: 3599.2 [P.O.:270; I.V.:3279.2; IV Piggyback:50] Out: 1610 [Urine:1250; Blood:25] Intake/Output this shift: Total I/O In: -  Out: 500 [Urine:500]  Lungs: CTAB  Cardiovascular: RRR  Abd: soft, NT, ND, incisions c/d/i  Extremities: no edema  Neuro: AOx4  Lab Results: CBC  Recent Labs    08/12/17 2352 08/13/17 0531  WBC 10.6* 11.0*  HGB 13.2 13.0  HCT 40.4 39.5  PLT 343 340   BMET Recent Labs    08/13/17 0531  NA 137  K 3.8  CL 103  CO2 25  GLUCOSE 114*  BUN 10  CREATININE 0.60  CALCIUM 8.7*   PT/INR No results for input(s): LABPROT, INR in the last 72 hours. ABG No results for input(s): PHART, HCO3 in the last 72 hours.  Invalid input(s): PCO2, PO2  Studies/Results:  Anti-infectives: Anti-infectives (From admission, onward)   Start     Dose/Rate Route Frequency Ordered Stop   08/12/17 0613  cefoTEtan in Dextrose 5% (CEFOTAN) IVPB 2 g     2 g Intravenous On call to O.R. 08/12/17 9604 08/12/17 0755      Medications: Scheduled Meds: . acetaminophen (TYLENOL) oral liquid 160 mg/5 mL  650 mg Oral Q6H  . amLODipine  10 mg Oral Daily  . enoxaparin (LOVENOX) injection  30 mg Subcutaneous Q12H  . insulin aspart  0-20 Units Subcutaneous TID WC  . insulin aspart  0-5 Units Subcutaneous QHS  . losartan  100 mg Oral Daily  . metoprolol succinate  100 mg Oral Daily  . pantoprazole (PROTONIX) IV  40 mg Intravenous QHS  . protein supplement  shake  2 oz Oral Q2H   Continuous Infusions: . sodium chloride 125 mL/hr at 08/12/17 1458   PRN Meds:.albuterol, hydrALAZINE, morphine injection, ondansetron (ZOFRAN) IV, oxyCODONE, simethicone  Assessment/Plan: Patient Active Problem List   Diagnosis Date Noted  . Morbid obesity ([place]) 08/12/2017  . Greater trochanteric bursitis of both hips 12/19/2016  . OSA (obstructive sleep apnea) 07/26/2016  . Hypersomnia 06/11/2016  . Diabetes ([place]) 07/19/2015  . History of colonic polyps 12/01/2014  . Morbidly obese ([place]) 10/05/2013  . Partial nontraumatic tear of right rotator cuff 09/21/2013  . Goiter 12/10/2011  . Asthma 12/10/2011  . 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 TIA (transient ischemic attack) 02/13/2011  . PROTEINURIA, MILD 09/15/2009  . Hyperlipidemia 12/01/2006  . Essential hypertension 07/07/2006   s/p Procedure(s): LAPAROSCOPIC ROUX-EN-Y GASTRIC BYPASS WITH HIATAL HERNIA REPAIR AND UPPER ENDOSCOPY 08/12/2017 -somnolent overnight, just starting oral intake now, labs fine -continue post op protocol -may benefit from lovenox for 30 days post op, will reevaluate prior to discharge  Disposition:  LOS: 1 day  The patient will be in the hospital for normal postop protocol  Mickeal Skinner, MD [phone] [facility]

## 2017-08-13 NOTE — Progress Notes (Signed)
Pt. placed on CPAP for h/s, tolerating well, made aware to notify if needed. 

## 2017-08-14 ENCOUNTER — Telehealth: Payer: Self-pay | Admitting: Internal Medicine

## 2017-08-14 LAB — CBC WITH DIFFERENTIAL/PLATELET
Basophils Absolute: 0 10*3/uL (ref 0.0–0.1)
Basophils Relative: 0 %
EOS PCT: 0 %
Eosinophils Absolute: 0 10*3/uL (ref 0.0–0.7)
HCT: 40.1 % (ref 36.0–46.0)
Hemoglobin: 12.5 g/dL (ref 12.0–15.0)
LYMPHS ABS: 2.4 10*3/uL (ref 0.7–4.0)
Lymphocytes Relative: 23 %
MCH: 26.8 pg (ref 26.0–34.0)
MCHC: 31.2 g/dL (ref 30.0–36.0)
MCV: 86.1 fL (ref 78.0–100.0)
MONO ABS: 0.7 10*3/uL (ref 0.1–1.0)
MONOS PCT: 6 %
Neutro Abs: 7.3 10*3/uL (ref 1.7–7.7)
Neutrophils Relative %: 71 %
PLATELETS: 339 10*3/uL (ref 150–400)
RBC: 4.66 MIL/uL (ref 3.87–5.11)
RDW: 14.3 % (ref 11.5–15.5)
WBC: 10.3 10*3/uL (ref 4.0–10.5)

## 2017-08-14 LAB — GLUCOSE, CAPILLARY: Glucose-Capillary: 147 mg/dL — ABNORMAL HIGH (ref 65–99)

## 2017-08-14 MED ORDER — ENOXAPARIN SODIUM 40 MG/0.4ML ~~LOC~~ SOLN: 40.0000 mg | SUBCUTANEOUS | 0 refills | Status: DC

## 2017-08-14 MED ORDER — BASAGLAR KWIKPEN 100 UNIT/ML ~~LOC~~ SOPN: 10.0000 [IU] | PEN_INJECTOR | SUBCUTANEOUS | 11 refills | Status: DC

## 2017-08-14 MED ORDER — ENOXAPARIN (LOVENOX) PATIENT EDUCATION KIT
PACK | Freq: Once | Status: DC
  Filled 2017-08-14: qty 1

## 2017-08-14 MED ORDER — OXYCODONE HCL 5 MG/5ML PO SOLN: 5.0000 mg | Freq: Four times a day (QID) | ORAL | 0 refills | Status: DC | PRN

## 2017-08-14 NOTE — Discharge Summary (Signed)
Physician Discharge Summary  Rebekah Kennedy KCM:[id number] DOB: Jun 09, 1955 DOA: 08/12/2017  PCP: Binnie Rail, MD  Admit date: 08/12/2017 Discharge date: 08/14/2017  Recommendations for Outpatient Follow-up:  1.  (include homehealth, outpatient follow-up instructions, specific recommendations for PCP to follow-up on, etc.)  Follow-up Information    Taimur Fier, [name], MD. Go on 09/04/2017.   Specialty:  General Surgery Why:  at [address] Contact information: [address] [place] [address] [phone]        Jermari Tamargo, [name], MD .   Specialty:  General Surgery Contact information: [place] [address] [phone]          Discharge Diagnoses:  Active Problems:   Morbid obesity [facility])   Surgical Procedure: Laparoscopic Sleeve Gastrectomy, upper endoscopy  Discharge Condition: Good Disposition: Home  Diet recommendation: Postoperative sleeve gastrectomy diet (liquids only)  Filed Weights   08/12/17 0628 08/13/17 0600 08/14/17 0507  Weight: 105.9 kg (233 lb 6.4 oz) 105.8 kg (233 lb 4 oz) 114.3 kg (251 lb 15.8 oz)     Hospital Course:  The patient was admitted after undergoing Roux-en-Y gastric bypass. POD 0 she was somnolent but arousable. POD 1 she was more awake and ambulated well. POD 1 she was started on the water diet protocol and tolerated 200 ml in the first shift. POD 2 she was much more awake and tolerating liquids well. Once meeting the water amount she was advanced to bariatric protein shakes which they tolerated and were discharged home POD 2.  Treatments: surgery: Roux-en-Y gastric bypass  Discharge Instructions   Allergies as of 08/14/2017      Reactions   Quinapril Cough      Medication List    STOP taking these medications   oxyCODONE 5 MG immediate release tablet Commonly known as:  Oxy IR/ROXICODONE Replaced by:  oxyCODONE 5 MG/5ML solution     TAKE these medications   albuterol 108  (90 Base) MCG/ACT inhaler Commonly known as:  PROVENTIL HFA;VENTOLIN HFA Inhale 2 puffs into the lungs every 4 (four) hours as needed for wheezing or shortness of breath. What changed:  Another medication with the same name was removed. Continue taking this medication, and follow the directions you see here.   amLODipine 10 MG tablet Commonly known as:  NORVASC TAKE ONE TABLET BY MOUTH ONE TIME DAILY Notes to patient:  Monitor Blood Pressure Daily and keep a log for primary care physician.  You may need to make changes to your medications with rapid weight loss.     BASAGLAR KWIKPEN 100 UNIT/ML Sopn Inject 0.1 mLs (10 Units total) into the skin every morning. And pen needles 4/day What changed:  how much to take   BD ULTRA-FINE PEN NEEDLES 29G X 12.7MM Misc Generic drug:  Insulin Pen Needle USE 3 TIMES DAILY AS DIRECTED   Biotin 5000 MCG Tabs Take 5,000 mcg by mouth daily.   blood glucose meter kit and supplies Kit One touch verio. Use up to four times daily as directed. (FOR ICD-9 250.00, 250.01).   CHEWABLE CALCIUM/D PO Take 1 tablet by mouth 3 (three) times daily.   clopidogrel 75 MG tablet Commonly known as:  PLAVIX Take 1 tablet (75 mg total) by mouth daily.   enoxaparin 40 MG/0.4ML injection Commonly known as:  LOVENOX Inject 0.4 mLs (40 mg total) into the skin daily for 30 doses.   losartan 100 MG tablet Commonly known as:  COZAAR TAKE  1 TAB BY MOUTH ONCE DAILY. Notes to patient:  Monitor Blood Pressure Daily and keep a log for primary care physician.  You may need to make changes to your medications with rapid weight loss.     LUBRICATING EYE DROPS OP Place 1 drop into both eyes daily as needed (dry eyes).   metoprolol succinate 100 MG 24 hr tablet Commonly known as:  TOPROL-XL TAKE 1 TABLET BY MOUTH DAILY WITH OR IMMEDIATELY FOLLOWING A MEAL Notes to patient:  Monitor Blood Pressure Daily and keep a log for primary care physician.  You may need to make changes  to your medications with rapid weight loss.     montelukast 10 MG tablet Commonly known as:  SINGULAIR Take 1 tablet (10 mg total) by mouth at bedtime. What changed:  when to take this   multivitamin tablet Take 1 tablet by mouth 2 (two) times daily.   ondansetron 4 MG tablet Commonly known as:  ZOFRAN Take 4 mg by mouth every 8 (eight) hours as needed for nausea/vomiting.   oxyCODONE 5 MG/5ML solution Commonly known as:  ROXICODONE Take 5 mLs (5 mg total) by mouth every 6 (six) hours as needed for up to 20 doses for severe pain. Replaces:  oxyCODONE 5 MG immediate release tablet   pantoprazole 40 MG tablet Commonly known as:  PROTONIX Take 40 mg by mouth daily.   pravastatin 20 MG tablet Commonly known as:  PRAVACHOL Take 1 tablet (20 mg total) by mouth daily. What changed:  when to take this   spironolactone 25 MG tablet Commonly known as:  ALDACTONE Take 2 tablets (50 mg total) by mouth daily. Notes to patient:  Monitor Blood Pressure Daily and keep a log for primary care physician.  Monitor for symptoms of dehydration.  You may need to make changes to your medications with rapid weight loss.        Follow-up Information    Zorian Gunderman, [name], MD. Go on 09/04/2017.   Specialty:  General Surgery Why:  at [address] Contact information: [place] [address] [phone]        Daaiyah Baumert, [name], MD .   Specialty:  General Surgery Contact information: [place] [address] [phone]            The results of significant diagnostics from this hospitalization (including imaging, microbiology, ancillary and laboratory) are listed below for reference.    Significant Diagnostic Studies: No results found.  Labs: Basic Metabolic Panel: Recent Labs  Lab 08/08/17 1523 08/13/17 0531  NA 142 137  K 4.5 3.8  CL 106 103  CO2 26 25  GLUCOSE 119* 114*  BUN 24* 10  CREATININE 0.73 0.60  CALCIUM 10.6*  8.7*   Liver Function Tests: Recent Labs  Lab 08/08/17 1523 08/13/17 0531  AST 23 89*  ALT 30 145*  ALKPHOS 67 58  BILITOT 0.5 0.6  PROT 7.2 6.6  ALBUMIN 3.8 3.2*    CBC: Recent Labs  Lab 08/08/17 1523 08/12/17 1041 08/12/17 2352 08/13/17 0531 08/14/17 0452  WBC 9.5  --  10.6* 11.0* 10.3  NEUTROABS 5.0  --   --  8.4* 7.3  HGB 13.8 13.4 13.2 13.0 12.5  HCT 43.2 40.9 40.4 39.5 40.1  MCV 84.7  --  83.5 83.3 86.1  PLT 366  --  343 340 339    CBG: Recent Labs  Lab 08/13/17 0721 08/13/17 1148 08/13/17 1624 08/13/17 2113 08/14/17  [place]    Active Problems:   Morbid obesity ([place])   Time coordinating discharge: 38mn

## 2017-08-14 NOTE — Discharge Instructions (Signed)
Enoxaparin injection °What is this medicine? °ENOXAPARIN (ee nox a PA rin) is used after knee, hip, or abdominal surgeries to prevent blood clotting. It is also used to treat existing blood clots in the lungs or in the veins. °This medicine may be used for other purposes; ask your health care provider or pharmacist if you have questions. °COMMON BRAND NAME(S): Lovenox °What should I tell my health care provider before I take this medicine? °They need to know if you have any of these conditions: °-bleeding disorders, hemorrhage, or hemophilia °-infection of the heart or heart valves °-kidney or liver disease °-previous stroke °-prosthetic heart valve °-recent surgery or delivery of a baby °-ulcer in the stomach or intestine, diverticulitis, or other bowel disease °-an unusual or allergic reaction to enoxaparin, heparin, pork or pork products, other medicines, foods, dyes, or preservatives °-pregnant or trying to get pregnant °-breast-feeding °How should I use this medicine? °This medicine is for injection under the skin. It is usually given by a health-care professional. You or a family member may be trained on how to give the injections. If you are to give yourself injections, make sure you understand how to use the syringe, measure the dose if necessary, and give the injection. To avoid bruising, do not rub the site where this medicine has been injected. Do not take your medicine more often than directed. Do not stop taking except on the advice of your doctor or health care professional. °Make sure you receive a puncture-resistant container to dispose of the needles and syringes once you have finished with them. Do not reuse these items. Return the container to your doctor or health care professional for proper disposal. °Talk to your pediatrician regarding the use of this medicine in children. Special care may be needed. °Overdosage: If you think you have taken too much of this medicine contact a poison control  center or emergency room at once. °NOTE: This medicine is only for you. Do not share this medicine with others. °What if I miss a dose? °If you miss a dose, take it as soon as you can. If it is almost time for your next dose, take only that dose. Do not take double or extra doses. °What may interact with this medicine? °-aspirin and aspirin-like medicines °-certain medicines that treat or prevent blood clots °-dipyridamole °-NSAIDs, medicines for pain and inflammation, like ibuprofen or naproxen °This list may not describe all possible interactions. Give your health care provider a list of all the medicines, herbs, non-prescription drugs, or dietary supplements you use. Also tell them if you smoke, drink alcohol, or use illegal drugs. Some items may interact with your medicine. °What should I watch for while using this medicine? °Visit your doctor or health care professional for regular checks on your progress. Your condition will be monitored carefully while you are receiving this medicine. °Notify your doctor or health care professional and seek emergency treatment if you develop breathing problems; changes in vision; chest pain; severe, sudden headache; pain, swelling, warmth in the leg; trouble speaking; sudden numbness or weakness of the face, arm, or leg. These can be signs that your condition has gotten worse. °If you are going to have surgery, tell your doctor or health care professional that you are taking this medicine. °Do not stop taking this medicine without first talking to your doctor. Be sure to refill your prescription before you run out of medicine. °Avoid sports and activities that might cause injury while you are using this medicine. Severe   falls or injuries can cause unseen bleeding. Be careful when using sharp tools or knives. Consider using an electric razor. Take special care brushing or flossing your teeth. Report any injuries, bruising, or red spots on the skin to your doctor or health care  professional. °What side effects may I notice from receiving this medicine? °Side effects that you should report to your doctor or health care professional as soon as possible: °-allergic reactions like skin rash, itching or hives, swelling of the face, lips, or tongue °-feeling faint or lightheaded, falls °-signs and symptoms of bleeding such as bloody or black, tarry stools; red or dark-brown urine; spitting up blood or brown material that looks like coffee grounds; red spots on the skin; unusual bruising or bleeding from the eye, gums, or nose °Side effects that usually do not require medical attention (report to your doctor or health care professional if they continue or are bothersome): °-pain, redness, or irritation at site where injected °This list may not describe all possible side effects. Call your doctor for medical advice about side effects. You may report side effects to FDA at 1-800-FDA-1088. °Where should I keep my medicine? °Keep out of the reach of children. °Store at room temperature between 15 and 30 degrees C (59 and 86 degrees F). Do not freeze. If your injections have been specially prepared, you may need to store them in the refrigerator. Ask your pharmacist. Throw away any unused medicine after the expiration date. °NOTE: This sheet is a summary. It may not cover all possible information. If you have questions about this medicine, talk to your doctor, pharmacist, or health care provider. °© 2018 [employer] (2013-09-08 16:06:21) ° ° ° ° °GASTRIC BYPASS/SLEEVE ° Home Care Instructions ° ° These instructions are to help you care for yourself when you go home. ° °Call: If you have any problems. °• Call [phone] and ask for the surgeon on call °• If you need immediate help, come to the ER at [facility].  °• Tell the ER staff that you are a new post-op gastric bypass or gastric sleeve patient °  °Signs and symptoms to report: • Severe vomiting or nausea °o If you cannot keep down  clear liquids for longer than 1 day, call your surgeon  °• Abdominal pain that does not get better after taking your pain medication °• Fever over 100.4° F with chills °• Heart beating over 100 beats a minute °• Shortness of breath at rest °• Chest pain °•  Redness, swelling, drainage, or foul odor at incision (surgical) sites °•  If your incisions open or pull apart °• Swelling or pain in calf (lower leg) °• Diarrhea (Loose bowel movements that happen often), frequent watery, uncontrolled bowel movements °• Constipation, (no bowel movements for 3 days) if this happens: Pick one °o Milk of Magnesia, 2 tablespoons by mouth, 3 times a day for 2 days if needed °o Stop taking Milk of Magnesia once you have a bowel movement °o Call your doctor if constipation continues °Or °o Miralax  (instead of Milk of Magnesia) following the label instructions °o Stop taking Miralax once you have a bowel movement °o Call your doctor if constipation continues °• Anything you think is not normal °  °Normal side effects after surgery: • Unable to sleep at night or unable to focus °• Irritability or moody °• Being tearful (crying) or depressed °These are common complaints, possibly related to your anesthesia medications that put you to sleep, stress of surgery,   and change in lifestyle.  This usually goes away a few weeks after surgery.  If these feelings continue, call your primary care doctor. °  °Wound Care: You may have surgical glue, steri-strips, or staples over your incisions after surgery °• Surgical glue:  Looks like a clear film over your incisions and will wear off a little at a time °• Steri-strips: Strips of tape over your incisions. You may notice a yellowish color on the skin under the steri-strips. This is used to make the   steri-strips stick better. Do not pull the steri-strips off - let them fall off °• Staples: Staples may be removed before you leave the hospital °o If you go home with staples, call [facility], [phone] at for an appointment with your surgeon’s nurse to have staples removed 10 days after surgery. °• Showering: You may shower two (2) days after your surgery unless your surgeon tells you differently °o Wash gently around incisions with warm soapy water, rinse well, and gently pat dry  °o No tub baths until staples are removed, steri-strips fall off or glue is gone.  °  °Medications: • Medications should be liquid or crushed if larger than the size of a dime °• Extended release pills (medication that release a little bit at a time through the day) should NOT be crushed or cut. (examples include XL, ER, DR, SR) °• Depending on the size and number of medications you take, you may need to space (take a few throughout the day)/change the time you take your medications so that you do not over-fill your pouch (smaller stomach) °• Make sure you follow-up with your primary care doctor to make medication changes needed during rapid weight loss and life-style changes °• If you have diabetes, follow up with the doctor that orders your diabetes medication(s) within one week after surgery and check your blood sugar regularly. °• Do not drive while taking prescription pain medication  °• It is ok to take Tylenol by the bottle instructions with your pain medicine or instead of your pain medicine as needed.  DO NOT TAKE NSAIDS (EXAMPLES OF NSAIDS:  IBUPROFREN/ NAPROXEN)  °Diet:                    First 2 Weeks ° You will see the dietician t about two (2) weeks after your surgery. The dietician will increase the types of foods you can eat if you are handling liquids well: °• If you have severe vomiting or nausea and cannot keep down clear liquids lasting longer than 1 day, call your surgeon @ ([phone]) °Protein Shake °• Drink at least 2 ounces of shake 5-6 times per day °• Each serving of protein shakes (usually 8 - 12 ounces) should have: °o 15 grams of protein  °o And no more than 5 grams of carbohydrate   °• Goal for protein each day: °o Men = 80 grams per day °o Women = 60 grams per day °• Protein powder may be added to fluids such as non-fat milk or Lactaid milk or unsweetened Soy/Almond milk (limit to 35 grams added protein powder per serving) ° °Hydration °• Slowly increase the amount of water and other clear liquids as tolerated (See Acceptable Fluids) °• Slowly increase the amount of protein shake as tolerated  °•  Sip fluids slowly and throughout the day.  Do not use straws. °• May use sugar substitutes in small amounts (no more than 6 - 8 packets per   day; i.e. Splenda) ° °Fluid Goal °• The first goal is to drink at least 8 ounces of protein shake/drink per day (or as directed by the nutritionist); some examples of protein shakes are Syntrax Nectar, Adkins Advantage, EAS Edge HP, and Unjury. See handout from pre-op Bariatric Education Class: °o Slowly increase the amount of protein shake you drink as tolerated °o You may find it easier to slowly sip shakes throughout the day °o It is important to get your proteins in first °• Your fluid goal is to drink 64 - 100 ounces of fluid daily °o It may take a few weeks to build up to this °• 32 oz (or more) should be clear liquids  °And  °• 32 oz (or more) should be full liquids (see below for examples) °• Liquids should not contain sugar, caffeine, or carbonation ° °Clear Liquids: °• Water or Sugar-free flavored water (i.e. Fruit H2O, Propel) °• Decaffeinated coffee or tea (sugar-free) °• Crystal Lite, Wyler’s Lite, Minute Maid Lite °• Sugar-free Jell-O °• Bouillon or broth °• Sugar-free Popsicle:   *Less than 20 calories each; Limit 1 per day ° °Full Liquids: °Protein Shakes/Drinks + 2 choices per day of other full liquids °• Full liquids must be: °o No More Than 15 grams of Carbs per serving  °o No More Than 3 grams of Fat per serving °• Strained low-fat cream soup (except Cream of Potato or Tomato) °• Non-Fat milk °• Fat-free Lactaid Milk °• Unsweetened Soy Or  Unsweetened Almond Milk °• Low Sugar yogurt (Dannon Lite & Fit, Greek yogurt; Oikos Triple Zero; Chobani Simply 100; Yoplait 100 calorie Greek - No Fruit on the Bottom) ° °  °Vitamins and Minerals • Start 1 day after surgery unless otherwise directed by your surgeon °• 2 Chewable Bariatric Specific Multivitamin / Multimineral Supplement with iron (Example: Bariatric Advantage Multi EA) °• Chewable Calcium with Vitamin D-3 °(Example: 3 Chewable Calcium Plus 600 with Vitamin D-3) °o Take 500 mg three (3) times a day for a total of 1500 mg each day °o Do not take all 3 doses of calcium at one time as it may cause constipation, and you can only absorb 500 mg  at a time  °o Do not mix multivitamins containing iron with calcium supplements; take 2 hours apart °• Menstruating women and those with a history of anemia (a blood disease that causes weakness) may need extra iron °o Talk with your doctor to see if you need more iron °• Do not stop taking or change any vitamins or minerals until you talk to your dietitian or surgeon °• Your Dietitian and/or surgeon must approve all vitamin and mineral supplements °  °Activity and Exercise: Limit your physical activity as instructed by your doctor.  It is important to continue walking at home.  During this time, use these guidelines: °• Do not lift anything greater than ten (10) pounds for at least two (2) weeks °• Do not go back to work or drive until your surgeon says you can °• You may have sex when you feel comfortable  °o It is VERY important for female patients to use a reliable birth control method; fertility often increases after surgery  °o All hormonal birth control will be ineffective for 30 days after surgery due to medications given during surgery a barrier method must be used. °o Do not get pregnant for at least 18 months °• Start exercising as soon as your doctor tells you that you can °o Make sure   your doctor approves any physical activity °• Start with a simple  walking program °• Walk 5-15 minutes each day, 7 days per week.  °• Slowly increase until you are walking 30-45 minutes per day °Consider joining our BELT program. [phone] or email [email or web address] °  °Special Instructions Things to remember: °• Use your CPAP when sleeping if this applies to you ° °• [facility] has two free Bariatric Surgery Support Groups that meet monthly °o The 3rd Thursday of each month, 6 pm, [facility]  °o The 2nd Friday of each month, 11:45 am in the private dining room in the basement of [facility] °• It is very important to keep all follow up appointments with your surgeon, dietitian, primary care physician, and behavioral health practitioner °• Routine follow up schedule with your surgeon include appointments at 2-3 weeks, 6-8 weeks, 6 months, and 1 year at a minimum.  Your surgeon may request to see you more often.   °o After the first year, please follow up with your bariatric surgeon and dietitian at least once a year in order to maintain best weight loss results °[facility]: [phone] °[facility]: [phone] °Bariatric Nurse Coordinator: [phone] °  °   Reviewed and Endorsed  °by [facility] Patient Education Committee, June, 2016 °Edits Approved: Aug, 2018 ° ° ° °

## 2017-08-14 NOTE — Progress Notes (Signed)
Patient alert and oriented, pain is controlled. Patient is tolerating fluids, advanced to protein shake today, patient is tolerating well. Reviewed Gastric Bypass discharge instructions with patient and patient is able to articulate understanding. Provided information on BELT program, Support Group and [facility] outpatient pharmacy. All questions answered, will continue to monitor.    

## 2017-08-14 NOTE — Progress Notes (Signed)
Patient alert and oriented, Post op day 2.  Provided support and encouragement.  Encouraged pulmonary toilet, ambulation and small sips of liquids.  All questions answered.  Will continue to monitor. 

## 2017-08-14 NOTE — Telephone Encounter (Signed)
Copied from [facility] [id number]. Topic: Quick Communication - See Telephone Encounter >> Aug 14, 2017  4:48 PM P[place]ell Belt A wrote: CRM for notification. See Telephone encounter for: 08/14/17. Pt called in and stated that she just had bariatric surgery and needs to know what meds she needs to take and what meds she dont need to take   Best number [phone]

## 2017-08-14 NOTE — Progress Notes (Signed)
Discharge instructions discussed with patient and family, verbalized agreement and understanding 

## 2017-08-16 ENCOUNTER — Other Ambulatory Visit: Payer: Self-pay | Admitting: Internal Medicine

## 2017-08-16 NOTE — Telephone Encounter (Signed)
She should be monitoring her sugars and BP at home.  She can contact Dr Loanne Drilling regarding her sugars if there are any concerns   She can stop the amlodipine.  Depending on her BP we will stop the other meds one at a time as well.

## 2017-08-16 NOTE — Telephone Encounter (Signed)
Spoke with pt to inform. Pt does not have a BP cuff but is going to get one. Pt has appt on Monday to discuss further.

## 2017-08-17 NOTE — Progress Notes (Signed)
Subjective:    Patient ID: Rebekah Kennedy, female    DOB: [birth year], 62 y.o.   MRN: [id number]  HPI The patient is here for follow up.  She is recently had gastric sleeve surgery on 08/12/17 and is here to review medications.  S/p gastric sleeve:  She is drinking the protein drinks, jello and yogurt.  She is tolerating food/drinks well.  She denies any nausea or GERD.  She is having some constipation.  She has not taken anything for it yet, but was thinking about trying miralax, which she has at home.   Hypertension: She is currently taking all medications except the amlodipine and she is only taking 25 mg of the spironolactone not 50 mg .  She is monitoring her blood pressure at home and it has been running 150/90's.    Diabetes:  Her sugars have been a little higher 204, 158.  She is taking basaglar 10 units daily.  She was on a much higher dose prior to the sugery.  She denies any low sugars.     Medications and allergies reviewed with patient and updated if appropriate.  Patient Active Problem List   Diagnosis Date Noted  . 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 Constipation 08/19/2017  . Greater trochanteric bursitis of both hips 12/19/2016  . OSA (obstructive sleep apnea) 07/26/2016  . Hypersomnia 06/11/2016  . Diabetes ([place]) 07/19/2015  . History of colonic polyps 12/01/2014  . Morbidly obese ([place]) 10/05/2013  . Partial nontraumatic tear of right rotator cuff 09/21/2013  . Goiter 12/10/2011  . Asthma 12/10/2011  . 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 TIA (transient ischemic attack) 02/13/2011  . PROTEINURIA, MILD 09/15/2009  . Hyperlipidemia 12/01/2006  . Essential hypertension 07/07/2006    Current Outpatient Medications on File Prior to Visit  Medication Sig Dispense Refill  . albuterol (PROVENTIL HFA;VENTOLIN HFA) 108 (90 Base) MCG/ACT inhaler Inhale 2 puffs into the lungs every 4 (four) hours as needed for wheezing or shortness of breath. 18 g 2  . BD  ULTRA-FINE PEN NEEDLES 29G X 12.7MM MISC USE 3 TIMES DAILY AS DIRECTED 100 each 1  . Biotin 5000 MCG TABS Take 5,000 mcg by mouth daily.     . blood glucose meter kit and supplies KIT One touch verio. Use up to four times daily as directed. (FOR ICD-9 250.00, 250.01). 1 each 0  . Calcium Carb-Ergocalciferol (CHEWABLE CALCIUM/D PO) Take 1 tablet by mouth 3 (three) times daily.    . Carboxymethylcellul-Glycerin (LUBRICATING EYE DROPS OP) Place 1 drop into both eyes daily as needed (dry eyes).    . enoxaparin (LOVENOX) 40 MG/0.4ML injection Inject 0.4 mLs (40 mg total) into the skin daily for 30 doses. 30 Syringe 0  . Insulin Glargine (BASAGLAR KWIKPEN) 100 UNIT/ML SOPN Inject 0.1 mLs (10 Units total) into the skin every morning. And pen needles 4/day 55 pen 11  . losartan (COZAAR) 100 MG tablet TAKE 1 TAB BY MOUTH ONCE DAILY. 90 tablet 1  . metoprolol succinate (TOPROL-XL) 100 MG 24 hr tablet TAKE 1 TABLET BY MOUTH DAILY WITH OR IMMEDIATELY FOLLOWING A MEAL 90 tablet 1  . montelukast (SINGULAIR) 10 MG tablet Take 1 tablet (10 mg total) by mouth at bedtime. (Patient taking differently: Take 10 mg by mouth daily. ) 90 tablet 3  . Multiple Vitamin (MULTIVITAMIN) tablet Take 1 tablet by mouth 2 (two) times daily.     Marland Kitchen oxyCODONE (ROXICODONE) 5 MG/5ML solution Take 5 mLs (5  mg total) by mouth every 6 (six) hours as needed for up to 20 doses for severe pain. 100 mL 0  . pantoprazole (PROTONIX) 40 MG tablet Take 40 mg by mouth daily.  2  . pravastatin (PRAVACHOL) 20 MG tablet Take 1 tablet (20 mg total) by mouth daily. (Patient taking differently: Take 20 mg by mouth at bedtime. ) 90 tablet 3   No current facility-administered medications on file prior to visit.     Past Medical History:  Diagnosis Date  . ASTHMA NOS W/ACUTE EXACERBATION 07/10/2010  . Cervical dysplasia   . DEPRESSION 03/16/2007  . DIABETES MELLITUS, TYPE II 12/01/2006  . Dysmenorrhea   . Fibroid   . HYPERLIPIDEMIA 12/01/2006  .  HYPERTENSION 07/07/2006  . Obesity   . Osteopenia 05/2017   T score -1.5 FRAX 2.6% / 0.1%  . Retinal edema    gets steriod inj in the eyes    . Sleep apnea    HAS CPAP BUT ADMITS DOES NOT USE JUDICIALLY   . Splenomegaly    in college  . Stroke [facility]) 2012   R pontine, mild residual left hemiparesis  . TIA (transient ischemic attack) 2012   2 TIAs 1 week apart of each other     Past Surgical History:  Procedure Laterality Date  . 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 APPENDECTOMY    . BIOPSY THYROID  05/02/11   Nonneoplastic goiter  . BREAST BIOPSY    . BREAST LUMPECTOMY WITH RADIOACTIVE SEED LOCALIZATION Left 07/25/2017   Procedure: LEFT BREAST LUMPECTOMY WITH RADIOACTIVE SEED LOCALIZATION;  Surgeon: Coralie Keens, MD;  Location: [place];  Service: General;  Laterality: Left;  . BREAST SURGERY     Reduction  . COLPOSCOPY    . DILATION AND CURETTAGE OF UTERUS  1975   DUB  . EYE SURGERY     Laser  . GASTRIC ROUX-EN-Y N/A 08/12/2017   Procedure: LAPAROSCOPIC ROUX-EN-Y GASTRIC BYPASS WITH HIATAL HERNIA REPAIR AND UPPER ENDOSCOPY;  Surgeon: [name], [name], MD;  Location: [facility];  Service: General;  Laterality: N/A;  . GYNECOLOGIC CRYOSURGERY    . MYOMECTOMY    . OVARIAN CYST REMOVAL    . PELVIC LAPAROSCOPY  75,88   DL lysis of adhesions    Social History   Socioeconomic History  . Marital status: Single    Spouse name: Not on file  . Number of children: 2  . Years of education: Not on file  . Highest education level: Not on file  Occupational History  . Occupation: Product manager: [employer]  . Financial resource strain: Not on file  . Food insecurity:    Worry: Not on file    Inability: Not on file  . Transportation needs:    Medical: Not on file    Non-medical: Not on file  Tobacco Use  . Smoking status: Never Smoker  . Smokeless tobacco: Never Used  Substance and Sexual Activity  . Alcohol use: Yes    Alcohol/week: 0.0 oz      Comment: very rare  . Drug use: No  . Sexual activity: Not Currently    Birth control/protection: Post-menopausal    Comment: 1st intercourse 62 yo-Fewer than 5 partners  Lifestyle  . Physical activity:    Days per week: Not on file    Minutes per session: Not on file  . Stress: Not on file  Relationships  . Social connections:  Talks on phone: Not on file    Gets together: Not on file    Attends religious service: Not on file    Active member of club or organization: Not on file    Attends meetings of clubs or organizations: Not on file    Relationship status: Not on file  Other Topics Concern  . 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 Cancer Maternal Grandmother        Colon Cancer  . Asthma Maternal Grandmother   . Diabetes Father   . Heart disease Father   . Hypertension Father   . Hyperlipidemia Father   . Hypertension Mother   . Heart disease Mother   . Ovarian cancer Mother   . Cancer Mother        Lung cancer  . Asthma Mother   . COPD Mother   . Hyperlipidemia Mother   . Diabetes Brother   . Hypertension Brother   . Kidney disease Brother   . Asthma Brother   . Heart disease Brother   . Hyperlipidemia Brother   . Cancer Brother        Prostate  . Graves' disease Sister   . Diabetes Sister   . Breast cancer Sister 38  . Graves' disease Paternal Grandmother   . Hypertension Paternal Grandmother   . Heart disease Paternal Grandmother   . Alzheimer's disease Paternal Grandmother   . Cancer Maternal Grandfather   . Heart failure Brother   . Heart failure Brother     Review of Systems  Constitutional: Negative for diaphoresis and fever.  Respiratory: Negative for cough, shortness of breath and wheezing.   Cardiovascular: Negative for chest pain, palpitations and leg swelling.  Gastrointestinal: Positive  for abdominal distention, abdominal pain (minimal) and constipation. Negative for blood in stool, nausea and vomiting.  Neurological: Negative for light-headedness and headaches.       Objective:   Vitals:   08/19/17 1435  BP: (!) 158/94  Pulse: 63  Resp: 16  Temp: (!) 97.4 F (36.3 C)  SpO2: 98%   BP Readings from Last 3 Encounters:  08/19/17 (!) 158/94  08/14/17 (!) 159/79  08/08/17 (!) 151/70   Wt Readings from Last 3 Encounters:  08/19/17 227 lb (103 kg)  08/14/17 251 lb 15.8 oz (114.3 kg)  08/08/17 239 lb 3.2 oz (108.5 kg)   Body mass index is 36.64 kg/m.   Physical Exam    Constitutional: Appears well-developed and well-nourished. No distress.  HENT:  Head: Normocephalic and atraumatic.  Neck: Neck supple. No tracheal deviation present. No thyromegaly present.  No cervical lymphadenopathy Cardiovascular: Normal rate, regular rhythm and normal heart sounds.   No murmur heard. No carotid bruit .  No edema Pulmonary/Chest: Effort normal and breath sounds normal. No respiratory distress. No has no wheezes. No rales.  Skin: Skin is warm and dry. Not diaphoretic.  Psychiatric: Normal mood and affect. Behavior is normal.      Assessment & Plan:    See Problem List for Assessment and Plan of chronic medical problems.

## 2017-08-19 ENCOUNTER — Telehealth (HOSPITAL_COMMUNITY): Payer: Self-pay

## 2017-08-19 ENCOUNTER — Encounter: Payer: Self-pay | Admitting: Internal Medicine

## 2017-08-19 ENCOUNTER — Ambulatory Visit: Payer: BC Managed Care – PPO | Admitting: Internal Medicine

## 2017-08-19 VITALS — BP 158/94 | HR 63 | Temp 97.4°F | Resp 16 | Wt 227.0 lb

## 2017-08-19 DIAGNOSIS — I10 Essential (primary) hypertension: Secondary | ICD-10-CM | POA: Diagnosis not present

## 2017-08-19 DIAGNOSIS — Z9889 Other specified postprocedural states: Secondary | ICD-10-CM

## 2017-08-19 DIAGNOSIS — K5900 Constipation, unspecified: Secondary | ICD-10-CM | POA: Insufficient documentation

## 2017-08-19 DIAGNOSIS — Z794 Long term (current) use of insulin: Secondary | ICD-10-CM | POA: Diagnosis not present

## 2017-08-19 DIAGNOSIS — E1151 Type 2 diabetes mellitus with diabetic peripheral angiopathy without gangrene: Secondary | ICD-10-CM | POA: Diagnosis not present

## 2017-08-19 DIAGNOSIS — Z9884 Bariatric surgery status: Secondary | ICD-10-CM | POA: Insufficient documentation

## 2017-08-19 MED ORDER — SPIRONOLACTONE 25 MG PO TABS: 25.0000 mg | ORAL_TABLET | Freq: Every day | ORAL | 1 refills | Status: DC

## 2017-08-19 MED ORDER — AMLODIPINE BESYLATE 10 MG PO TABS: 5.0000 mg | ORAL_TABLET | Freq: Every day | ORAL | 1 refills | Status: DC

## 2017-08-19 NOTE — Patient Instructions (Addendum)
  Medications reviewed and updated.  Changes include adding back amlodipine 5 mg daily.  Monitor your BP at home and update me with your readings.  Your goal BP is less than 140/90.  Please followup in September as scheduled.

## 2017-08-19 NOTE — Assessment & Plan Note (Signed)
Bp elevated Restart amlodipine 5 mg daily Continue all other medications Monitor BP at home - discussed goal She will update me via mychart with reading and we will adjust medications

## 2017-08-19 NOTE — Telephone Encounter (Addendum)
Await return call, message left for patient.  See contact note.  Patient called to discuss post bariatric surgery follow up questions.  See below:   1.  Tell me about your pain and pain management?no pain has not needed any pain medication  2.  Let's talk about fluid intake.  How much total fluid are you taking in? 54 ounces  3.  How much protein have you taken in the last 2 days? 60 grams of protein  4.  Have you had nausea?  Tell me about when have experienced nausea and what you did to help?has not had any pain medication  5.  Has the frequency or color changed with your urine?urinating often color of lemonade  6.  Tell me what your incisions look like?look good no problem healed   7.  Have you been passing gas? BM?yes has had BM, but hard to go to bathroom.  PCP asked patient to start using Miralax  8.  If a problem or question were to arise who would you call?  Do you know contact numbers for [place] [facility], [facility], and [facility]?aware of how to contact all agencies listed above.  9.  How has the walking going?yes walking regularly at home  10.  How are your vitamins and calcium going?  How are you taking them?no problem with multivitamins and calcium taking them as scheduled  Patient saw PCP yesterday for post op follow up and seeing Endocrinologist on Monday 4/8.

## 2017-08-19 NOTE — Assessment & Plan Note (Signed)
Taking 10 units of basaglar Most likely will be able to come off with more weight loss Monitor sugars closely

## 2017-08-19 NOTE — Assessment & Plan Note (Signed)
Tolerating diet by surgery

## 2017-08-19 NOTE — Assessment & Plan Note (Signed)
Will start miralax

## 2017-08-27 ENCOUNTER — Encounter: Payer: BC Managed Care – PPO | Attending: General Surgery | Admitting: Skilled Nursing Facility1

## 2017-08-27 DIAGNOSIS — Z9884 Bariatric surgery status: Secondary | ICD-10-CM | POA: Insufficient documentation

## 2017-08-27 DIAGNOSIS — E119 Type 2 diabetes mellitus without complications: Secondary | ICD-10-CM | POA: Insufficient documentation

## 2017-08-27 DIAGNOSIS — E1151 Type 2 diabetes mellitus with diabetic peripheral angiopathy without gangrene: Secondary | ICD-10-CM

## 2017-08-27 DIAGNOSIS — Z713 Dietary counseling and surveillance: Secondary | ICD-10-CM | POA: Diagnosis present

## 2017-08-27 DIAGNOSIS — I10 Essential (primary) hypertension: Secondary | ICD-10-CM | POA: Insufficient documentation

## 2017-08-27 DIAGNOSIS — E669 Obesity, unspecified: Secondary | ICD-10-CM | POA: Insufficient documentation

## 2017-08-27 DIAGNOSIS — Z6836 Body mass index (BMI) 36.0-36.9, adult: Secondary | ICD-10-CM | POA: Insufficient documentation

## 2017-08-27 DIAGNOSIS — Z794 Long term (current) use of insulin: Secondary | ICD-10-CM

## 2017-08-28 ENCOUNTER — Encounter: Payer: Self-pay | Admitting: Skilled Nursing Facility1

## 2017-08-28 NOTE — Progress Notes (Signed)
Bariatric Class:  Appt start time: 1530 end time:  1630.  2 Week Post-Operative Nutrition Class  Patient was seen on 08/27/2017 for Post-Operative Nutrition [facility] at [facility].   Surgery date: 08/12/2017 Surgery type: RYGB Start weight at [facility]: 251 Weight today: 225.2  The following the learning objectives were met by the patient during this course:  Identifies Phase 3A (Soft, High Proteins) Dietary Goals and will begin from 2 weeks post-operatively to 2 months post-operatively  Identifies appropriate sources of fluids and proteins   States protein recommendations and appropriate sources post-operatively  Identifies the need for appropriate texture modifications, mastication, and bite sizes when consuming solids  Identifies appropriate multivitamin and calcium sources post-operatively  Describes the need for physical activity post-operatively and will follow MD recommendations  States when to call healthcare provider regarding medication questions or post-operative complications  Handouts given during class include:  Phase 3A: Soft, High Protein Diet Handout  Follow-Up Plan: Patient will follow-up at [facility] in 6 weeks for 2 month post-op nutrition visit for diet advancement per MD.

## 2017-08-29 ENCOUNTER — Encounter: Payer: Self-pay | Admitting: Endocrinology

## 2017-08-29 ENCOUNTER — Ambulatory Visit: Payer: BC Managed Care – PPO | Admitting: Endocrinology

## 2017-08-29 VITALS — BP 162/88 | HR 73 | Wt 222.8 lb

## 2017-08-29 DIAGNOSIS — I10 Essential (primary) hypertension: Secondary | ICD-10-CM

## 2017-08-29 DIAGNOSIS — E669 Obesity, unspecified: Secondary | ICD-10-CM | POA: Diagnosis not present

## 2017-08-29 DIAGNOSIS — Z794 Long term (current) use of insulin: Secondary | ICD-10-CM

## 2017-08-29 DIAGNOSIS — E1151 Type 2 diabetes mellitus with diabetic peripheral angiopathy without gangrene: Secondary | ICD-10-CM

## 2017-08-29 MED ORDER — SITAGLIPTIN PHOSPHATE 100 MG PO TABS: 100.0000 mg | ORAL_TABLET | Freq: Every day | ORAL | 11 refills | Status: DC

## 2017-08-29 NOTE — Patient Instructions (Addendum)
Your blood pressure is high today.  Please see your primary care provider soon, to have it rechecked.  I have sent a prescription to your pharmacy, to change the basaglar to "Januvia."   check your blood sugar twice a day.  vary the time of day when you check, between before the 3 meals, and at bedtime.  also check if you have symptoms of your blood sugar being too high or too low.  please keep a record of the readings and bring it to your next appointment here (or you can bring the meter itself).  You can write it on any piece of paper.  please call us sooner if your blood sugar goes below 70, or if you have a lot of readings over 200. Please come back for a follow-up appointment in 1 month.

## 2017-08-29 NOTE — Progress Notes (Signed)
Subjective:    Patient ID: Rebekah Kennedy, female    DOB: 1956/04/30, 62 y.o.   MRN: [id number]  HPI Pt returns for f/u of diabetes mellitus:  DM type: Insulin-requiring type 2.  Dx'ed: 2004.  Complications: CVA, nephropathy, polyneuropathy, and PDR.  Therapy: insulin since 2013 GDM: never.  DKA: never Severe hypoglycemia: once, in 2018.  Pancreatitis: never.  Other: due to noncompliance with insulin injections and cbg monitoring, she is on a qd insulin regimen.   Interval history: She had gastric bypass surgery 2 weeks ago.  She has lost 28 lbs so far.  She takes 10 units qd.  no cbg record, but states cbg's vary from 120-280.  It is in general higher as the day goes on.  Past Medical History:  Diagnosis Date  . ASTHMA NOS W/ACUTE EXACERBATION 07/10/2010  . Cervical dysplasia   . DEPRESSION 03/16/2007  . DIABETES MELLITUS, TYPE II 12/01/2006  . Dysmenorrhea   . Fibroid   . HYPERLIPIDEMIA 12/01/2006  . HYPERTENSION 07/07/2006  . Obesity   . Osteopenia 05/2017   T score -1.5 FRAX 2.6% / 0.1%  . Retinal edema    gets steriod inj in the eyes    . Sleep apnea    HAS CPAP BUT ADMITS DOES NOT USE JUDICIALLY   . Splenomegaly    in college  . Stroke [facility]) 2012   R pontine, mild residual left hemiparesis  . TIA (transient ischemic attack) 2012   2 TIAs 1 week apart of each other     Past Surgical History:  Procedure Laterality Date  . 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 APPENDECTOMY    . BIOPSY THYROID  05/02/11   Nonneoplastic goiter  . BREAST BIOPSY    . BREAST LUMPECTOMY WITH RADIOACTIVE SEED LOCALIZATION Left 07/25/2017   Procedure: LEFT BREAST LUMPECTOMY WITH RADIOACTIVE SEED LOCALIZATION;  Surgeon: Coralie Keens, MD;  Location: [place];  Service: General;  Laterality: Left;  . BREAST SURGERY     Reduction  . COLPOSCOPY    . DILATION AND CURETTAGE OF UTERUS  1975   DUB  . EYE SURGERY     Laser  . GASTRIC ROUX-EN-Y N/A 08/12/2017   Procedure: LAPAROSCOPIC ROUX-EN-Y  GASTRIC BYPASS WITH HIATAL HERNIA REPAIR AND UPPER ENDOSCOPY;  Surgeon: [name], [name], MD;  Location: [facility];  Service: General;  Laterality: N/A;  . GYNECOLOGIC CRYOSURGERY    . MYOMECTOMY    . OVARIAN CYST REMOVAL    . PELVIC LAPAROSCOPY  75,88   DL lysis of adhesions    Social History   Socioeconomic History  . Marital status: Single    Spouse name: Not on file  . Number of children: 2  . Years of education: Not on file  . Highest education level: Not on file  Occupational History  . Occupation: Product manager: [employer]  . Financial resource strain: Not on file  . Food insecurity:    Worry: Not on file    Inability: Not on file  . Transportation needs:    Medical: Not on file    Non-medical: Not on file  Tobacco Use  . Smoking status: Never Smoker  . Smokeless tobacco: Never Used  Substance and Sexual Activity  . Alcohol use: Yes    Alcohol/week: 0.0 oz    Comment: very rare  . Drug use: No  . Sexual activity: Not Currently    Birth control/protection: Post-menopausal  Comment: 1st intercourse 62 yo-Fewer than 5 partners  Lifestyle  . Physical activity:    Days per week: Not on file    Minutes per session: Not on file  . Stress: Not on file  Relationships  . Social connections:    Talks on phone: Not on file    Gets together: Not on file    Attends religious service: Not on file    Active member of club or organization: Not on file    Attends meetings of clubs or organizations: Not on file    Relationship status: Not on file  . Intimate partner violence:    Fear of current or ex partner: Not on file    Emotionally abused: Not on file    Physically abused: Not on file    Forced sexual activity: Not on file  Other Topics Concern  . 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 albuterol (PROVENTIL HFA;VENTOLIN HFA) 108 (90 Base) MCG/ACT inhaler Inhale 2 puffs into the lungs every 4 (four) hours as needed for wheezing or shortness of breath. 18 g 2  . amLODipine (NORVASC) 10 MG tablet Take 0.5 tablets (5 mg total) by mouth daily. 90 tablet 1  . BD ULTRA-FINE PEN NEEDLES 29G X 12.7MM MISC USE 3 TIMES DAILY AS DIRECTED 100 each 1  . Biotin 5000 MCG TABS Take 5,000 mcg by mouth daily.     . Calcium Carb-Ergocalciferol (CHEWABLE CALCIUM/D PO) Take 1 tablet by mouth 3 (three) times daily.    . Carboxymethylcellul-Glycerin (LUBRICATING EYE DROPS OP) Place 1 drop into both eyes daily as needed (dry eyes).    . enoxaparin (LOVENOX) 40 MG/0.4ML injection Inject 0.4 mLs (40 mg total) into the skin daily for 30 doses. 30 Syringe 0  . losartan (COZAAR) 100 MG tablet TAKE 1 TAB BY MOUTH ONCE DAILY. 90 tablet 1  . metoprolol succinate (TOPROL-XL) 100 MG 24 hr tablet TAKE 1 TABLET BY MOUTH DAILY WITH OR IMMEDIATELY FOLLOWING A MEAL 90 tablet 1  . montelukast (SINGULAIR) 10 MG tablet Take 1 tablet (10 mg total) by mouth at bedtime. (Patient taking differently: Take 10 mg by mouth daily. ) 90 tablet 3  . Multiple Vitamin (MULTIVITAMIN) tablet Take 1 tablet by mouth 2 (two) times daily.     . pantoprazole (PROTONIX) 40 MG tablet Take 40 mg by mouth daily.  2  . pravastatin (PRAVACHOL) 20 MG tablet Take 1 tablet (20 mg total) by mouth daily. (Patient taking differently: Take 20 mg by mouth at bedtime. ) 90 tablet 3  . spironolactone (ALDACTONE) 25 MG tablet Take 1 tablet (25 mg total) by mouth daily. 90 tablet 1   No current facility-administered medications on file prior to visit.     Allergies  Allergen Reactions  . Quinapril Cough    Family History  Problem Relation Age of Onset  . Cancer Maternal Grandmother        Colon Cancer  . Asthma Maternal Grandmother   . Diabetes Father   . Heart disease Father   . Hypertension Father   .  Hyperlipidemia Father   . Hypertension Mother   . Heart disease Mother   . Ovarian cancer Mother   . Cancer Mother        Lung  cancer  . Asthma Mother   . COPD Mother   . Hyperlipidemia Mother   . Diabetes Brother   . Hypertension Brother   . Kidney disease Brother   . Asthma Brother   . Heart disease Brother   . Hyperlipidemia Brother   . Cancer Brother        Prostate  . Graves' disease Sister   . Diabetes Sister   . Breast cancer Sister 43  . Graves' disease Paternal Grandmother   . Hypertension Paternal Grandmother   . Heart disease Paternal Grandmother   . Alzheimer's disease Paternal Grandmother   . Cancer Maternal Grandfather   . Heart failure Brother   . Heart failure Brother     BP (!) 162/88 (BP Location: Left Arm, Patient Position: Sitting, Cuff Size: Normal)   Pulse 73   Wt 222 lb 12.8 oz (101.1 kg)   SpO2 94%   BMI 35.96 kg/m   Review of Systems She denies hypoglycemia.      Objective:   Physical Exam VITAL SIGNS:  See vs page GENERAL: no distress Pulses: dorsalis pedis intact bilat.   MSK: no deformity of the feet CV: trace bilat leg edema.  Skin:  no ulcer on the feet.  normal color and temp on the feet. Neuro: sensation is intact to touch on the feet Ext: There is bilateral onychomycosis of the toenails  Lab Results  Component Value Date   CREATININE 0.60 08/13/2017   BUN 10 08/13/2017   NA 137 08/13/2017   K 3.8 08/13/2017   CL 103 08/13/2017   CO2 25 08/13/2017       Assessment & Plan:  HTN: further weight loss will help Obesity: improved with surgery Type 2 DM: she is ready to d/c insulin  Patient Instructions  Your blood pressure is high today.  Please see your primary care provider soon, to have it rechecked.  I have sent a prescription to your pharmacy, to change the basaglar to "Januvia."   check your blood sugar twice a day.  vary the time of day when you check, between before the 3 meals, and at bedtime.  also check if you  have symptoms of your blood sugar being too high or too low.  please keep a record of the readings and bring it to your next appointment here (or you can bring the meter itself).  You can write it on any piece of paper.  please call us sooner if your blood sugar goes below 70, or if you have a lot of readings over 200. Please come back for a follow-up appointment in 1 month.

## 2017-08-30 ENCOUNTER — Telehealth: Payer: Self-pay | Admitting: Registered"

## 2017-08-30 NOTE — Telephone Encounter (Signed)
RD called pt to verify fluid intake once restarting soft, solid proteins 2 week post-bariatric surgery.   Pt was unavailable. RD left voicemail.

## 2017-09-03 ENCOUNTER — Telehealth: Payer: Self-pay | Admitting: Skilled Nursing Facility1

## 2017-09-03 NOTE — Telephone Encounter (Signed)
t called needing a copy of the post op handout.  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 Dietitian advised she eat every 3-5 hours to ensure she is meeting her needs.

## 2017-09-06 ENCOUNTER — Telehealth: Payer: Self-pay | Admitting: Dietician

## 2017-09-08 ENCOUNTER — Other Ambulatory Visit: Payer: Self-pay | Admitting: Neurology

## 2017-09-12 NOTE — Telephone Encounter (Signed)
Patient called for advice regarding a planned restaurant meal, and what she will be able to eat. She mentioned egg drop soup, told her she should be ok to eat that as well as other lean meats such as chicken or shrimp as long as they are not breaded and deep fried.

## 2017-09-22 ENCOUNTER — Inpatient Hospital Stay (HOSPITAL_COMMUNITY)
Admission: EM | Admit: 2017-09-22 | Discharge: 2017-09-28 | DRG: 690 | Disposition: A | Discharge status: Home / Self Care | Payer: BC Managed Care – PPO | Attending: Internal Medicine | Admitting: Internal Medicine

## 2017-09-22 ENCOUNTER — Other Ambulatory Visit: Payer: Self-pay

## 2017-09-22 ENCOUNTER — Emergency Department (HOSPITAL_COMMUNITY): Payer: BC Managed Care – PPO

## 2017-09-22 ENCOUNTER — Encounter (HOSPITAL_COMMUNITY): Payer: Self-pay | Admitting: Emergency Medicine

## 2017-09-22 ENCOUNTER — Ambulatory Visit (HOSPITAL_COMMUNITY)
Admission: EM | Admit: 2017-09-22 | Discharge: 2017-09-22 | Disposition: A | Discharge status: Home / Self Care | Payer: BC Managed Care – PPO

## 2017-09-22 ENCOUNTER — Telehealth: Payer: Self-pay | Admitting: General Surgery

## 2017-09-22 DIAGNOSIS — Z825 Family history of asthma and other chronic lower respiratory diseases: Secondary | ICD-10-CM

## 2017-09-22 DIAGNOSIS — Z9884 Bariatric surgery status: Secondary | ICD-10-CM

## 2017-09-22 DIAGNOSIS — I5032 Chronic diastolic (congestive) heart failure: Secondary | ICD-10-CM | POA: Diagnosis present

## 2017-09-22 DIAGNOSIS — J45909 Unspecified asthma, uncomplicated: Secondary | ICD-10-CM | POA: Diagnosis present

## 2017-09-22 DIAGNOSIS — K5900 Constipation, unspecified: Secondary | ICD-10-CM | POA: Diagnosis present

## 2017-09-22 DIAGNOSIS — E785 Hyperlipidemia, unspecified: Secondary | ICD-10-CM | POA: Diagnosis present

## 2017-09-22 DIAGNOSIS — M8580 Other specified disorders of bone density and structure, unspecified site: Secondary | ICD-10-CM | POA: Diagnosis present

## 2017-09-22 DIAGNOSIS — E876 Hypokalemia: Secondary | ICD-10-CM | POA: Diagnosis not present

## 2017-09-22 DIAGNOSIS — E1169 Type 2 diabetes mellitus with other specified complication: Secondary | ICD-10-CM | POA: Diagnosis present

## 2017-09-22 DIAGNOSIS — E669 Obesity, unspecified: Secondary | ICD-10-CM | POA: Diagnosis present

## 2017-09-22 DIAGNOSIS — N390 Urinary tract infection, site not specified: Principal | ICD-10-CM | POA: Diagnosis present

## 2017-09-22 DIAGNOSIS — Z8719 Personal history of other diseases of the digestive system: Secondary | ICD-10-CM

## 2017-09-22 DIAGNOSIS — E860 Dehydration: Secondary | ICD-10-CM | POA: Diagnosis present

## 2017-09-22 DIAGNOSIS — Z833 Family history of diabetes mellitus: Secondary | ICD-10-CM

## 2017-09-22 DIAGNOSIS — Z803 Family history of malignant neoplasm of breast: Secondary | ICD-10-CM

## 2017-09-22 DIAGNOSIS — Z841 Family history of disorders of kidney and ureter: Secondary | ICD-10-CM

## 2017-09-22 DIAGNOSIS — E049 Nontoxic goiter, unspecified: Secondary | ICD-10-CM | POA: Diagnosis present

## 2017-09-22 DIAGNOSIS — E1165 Type 2 diabetes mellitus with hyperglycemia: Secondary | ICD-10-CM | POA: Diagnosis present

## 2017-09-22 DIAGNOSIS — I110 Hypertensive heart disease with heart failure: Secondary | ICD-10-CM | POA: Diagnosis present

## 2017-09-22 DIAGNOSIS — Z6834 Body mass index (BMI) 34.0-34.9, adult: Secondary | ICD-10-CM

## 2017-09-22 DIAGNOSIS — Z8249 Family history of ischemic heart disease and other diseases of the circulatory system: Secondary | ICD-10-CM

## 2017-09-22 DIAGNOSIS — N179 Acute kidney failure, unspecified: Secondary | ICD-10-CM | POA: Diagnosis present

## 2017-09-22 DIAGNOSIS — Z8041 Family history of malignant neoplasm of ovary: Secondary | ICD-10-CM

## 2017-09-22 DIAGNOSIS — R112 Nausea with vomiting, unspecified: Secondary | ICD-10-CM | POA: Diagnosis present

## 2017-09-22 DIAGNOSIS — Z8349 Family history of other endocrine, nutritional and metabolic diseases: Secondary | ICD-10-CM

## 2017-09-22 DIAGNOSIS — Z888 Allergy status to other drugs, medicaments and biological substances status: Secondary | ICD-10-CM

## 2017-09-22 DIAGNOSIS — G4733 Obstructive sleep apnea (adult) (pediatric): Secondary | ICD-10-CM | POA: Diagnosis present

## 2017-09-22 DIAGNOSIS — I69354 Hemiplegia and hemiparesis following cerebral infarction affecting left non-dominant side: Secondary | ICD-10-CM

## 2017-09-22 DIAGNOSIS — K760 Fatty (change of) liver, not elsewhere classified: Secondary | ICD-10-CM | POA: Diagnosis present

## 2017-09-22 DIAGNOSIS — Z820 Family history of epilepsy and other diseases of the nervous system: Secondary | ICD-10-CM

## 2017-09-22 DIAGNOSIS — Z7901 Long term (current) use of anticoagulants: Secondary | ICD-10-CM

## 2017-09-22 DIAGNOSIS — I160 Hypertensive urgency: Secondary | ICD-10-CM | POA: Diagnosis present

## 2017-09-22 DIAGNOSIS — B952 Enterococcus as the cause of diseases classified elsewhere: Secondary | ICD-10-CM | POA: Diagnosis present

## 2017-09-22 LAB — CBC
HCT: 46.1 % — ABNORMAL HIGH (ref 36.0–46.0)
Hemoglobin: 15.3 g/dL — ABNORMAL HIGH (ref 12.0–15.0)
MCH: 27.6 pg (ref 26.0–34.0)
MCHC: 33.2 g/dL (ref 30.0–36.0)
MCV: 83.1 fL (ref 78.0–100.0)
PLATELETS: 268 10*3/uL (ref 150–400)
RBC: 5.55 MIL/uL — ABNORMAL HIGH (ref 3.87–5.11)
RDW: 14.8 % (ref 11.5–15.5)
WBC: 12.4 10*3/uL — ABNORMAL HIGH (ref 4.0–10.5)

## 2017-09-22 LAB — URINALYSIS, ROUTINE W REFLEX MICROSCOPIC
Bilirubin Urine: NEGATIVE
Glucose, UA: NEGATIVE mg/dL
Ketones, ur: 80 mg/dL — AB
Nitrite: NEGATIVE
PH: 7 (ref 5.0–8.0)
Protein, ur: 100 mg/dL — AB
SPECIFIC GRAVITY, URINE: 1.016 (ref 1.005–1.030)
WBC, UA: >50 WBC/hpf — ABNORMAL HIGH (ref 0–5)

## 2017-09-22 LAB — COMPREHENSIVE METABOLIC PANEL
ALT: 62 U/L — AB (ref 14–54)
AST: 30 U/L (ref 15–41)
Albumin: 4 g/dL (ref 3.5–5.0)
Alkaline Phosphatase: 67 U/L (ref 38–126)
Anion gap: 20 — ABNORMAL HIGH (ref 5–15)
BILIRUBIN TOTAL: 1.6 mg/dL — AB (ref 0.3–1.2)
BUN: 24 mg/dL — AB (ref 6–20)
CHLORIDE: 100 mmol/L — AB (ref 101–111)
CO2: 22 mmol/L (ref 22–32)
CREATININE: 1.52 mg/dL — AB (ref 0.44–1.00)
Calcium: 9.6 mg/dL (ref 8.9–10.3)
GFR calc Af Amer: 42 mL/min — ABNORMAL LOW (ref >60)
GFR calc non Af Amer: 36 mL/min — ABNORMAL LOW (ref >60)
GLUCOSE: 226 mg/dL — AB (ref 65–99)
Potassium: 3.5 mmol/L (ref 3.5–5.1)
SODIUM: 142 mmol/L (ref 135–145)
Total Protein: 7.9 g/dL (ref 6.5–8.1)

## 2017-09-22 LAB — I-STAT VENOUS BLOOD GAS, ED
BICARBONATE: 25.8 mmol/L (ref 20.0–28.0)
O2 SAT: 73 %
PCO2 VEN: 43.4 mmHg — AB (ref 44.0–60.0)
PH VEN: 7.382 (ref 7.250–7.430)
PO2 VEN: 39 mmHg (ref 32.0–45.0)
TCO2: 27 mmol/L (ref 22–32)

## 2017-09-22 LAB — I-STAT CG4 LACTIC ACID, ED: Lactic Acid, Venous: 1.85 mmol/L (ref 0.5–1.9)

## 2017-09-22 LAB — LIPASE, BLOOD: LIPASE: 49 U/L (ref 11–51)

## 2017-09-22 MED ORDER — LOSARTAN POTASSIUM 50 MG PO TABS: 50.0000 mg | ORAL_TABLET | Freq: Once | ORAL | Status: DC

## 2017-09-22 MED ORDER — METOPROLOL SUCCINATE ER 100 MG PO TB24
100.0000 mg | ORAL_TABLET | Freq: Every day | ORAL | Status: DC
  Administered 2017-09-22 – 2017-09-28 (×6): 100 mg via ORAL
  Filled 2017-09-22 (×6): qty 1

## 2017-09-22 MED ORDER — AMLODIPINE BESYLATE 5 MG PO TABS
5.0000 mg | ORAL_TABLET | Freq: Once | ORAL | Status: AC
  Administered 2017-09-22: 5 mg via ORAL
  Filled 2017-09-22: qty 1

## 2017-09-22 MED ORDER — HYDRALAZINE HCL 20 MG/ML IJ SOLN
10.0000 mg | INTRAMUSCULAR | Status: DC | PRN
  Administered 2017-09-23 – 2017-09-24 (×5): 10 mg via INTRAVENOUS
  Filled 2017-09-22 (×5): qty 1

## 2017-09-22 MED ORDER — SODIUM CHLORIDE 0.9 % IV BOLUS
1000.0000 mL | Freq: Once | INTRAVENOUS | Status: AC
  Administered 2017-09-22: 1000 mL via INTRAVENOUS

## 2017-09-22 MED ORDER — ONDANSETRON 4 MG PO TBDP
4.0000 mg | ORAL_TABLET | Freq: Once | ORAL | Status: AC | PRN
  Administered 2017-09-22: 4 mg via ORAL
  Filled 2017-09-22: qty 1

## 2017-09-22 MED ORDER — LOSARTAN POTASSIUM 50 MG PO TABS
100.0000 mg | ORAL_TABLET | Freq: Once | ORAL | Status: AC
  Administered 2017-09-22: 100 mg via ORAL
  Filled 2017-09-22: qty 2

## 2017-09-22 NOTE — ED Triage Notes (Signed)
Pt to ED with c/o nausea and vomiting x's 2 days.  Pt recently had gastric bypass.  Pt denies any pain.  Pt also st's she has hx of HTN but has not had her meds since Fri due to vomiting

## 2017-09-22 NOTE — ED Notes (Signed)
Requesting something to drink.  Encouraged not to eat or drink anything until seen.  Room currently being cleaned.  Updated to wait time.

## 2017-09-22 NOTE — ED Provider Notes (Signed)
Medical screening examination/treatment/procedure(s) were conducted as a shared visit with non-physician practitioner(s) and myself.  I personally evaluated the patient during the encounter.  None Patient reports that she had eaten something that she thought was bad about a week ago.  She had some vomiting but symptoms resolved.  She reports this happened a second time 2 days ago.  She ate some pork that she thinks is the culprit.  She reports since then she has been vomiting 4-5 times per day.  He denies any associated abdominal pain.  No diarrhea.  No fever.  Patient is alert and appropriate.  She does appear slightly fatigued.  No respiratory distress.  Normal for heart.  Positive bowel sounds.  Abdomen is soft and nontender.  No significant peripheral edema.  Moves her coronary peripheral symmetric.  Diagnostic studies indicate acute kidney injury.  Findings consistent with dehydration with hemoconcentration.  At this time, with patient being diabetic, history of gastric bypass surgery and 2 days of vomiting with AKI and dehydration plan will be for admission for continued hydration and control of symptoms.   Charlesetta Shanks, MD 09/22/17 2258

## 2017-09-22 NOTE — ED Provider Notes (Signed)
[facility] Provider Note   CSN: [id number] Arrival date & time: 09/22/17  1655     History   Chief Complaint Chief Complaint  Patient presents with  . Nausea    HPI Rebekah Kennedy is a 62 y.o. female who presents with nausea and vomiting. PMH significant for obesity s/p gastric bypass in March by Dr. Kieth Brightly, asthma, non-insulin dependent Type 2 DM, HTN, HLD, hx of CVA. She states that over the past couple of days she has recurrent N/V. She denies fever, headache, chest pain, SOB, cough, abdominal pain, diarrhea, urinary symptoms. Her coworker had somewhat similar symptoms which quickly resolved. She has not been able to keep anything down. She has not been able to take her medicines.   HPI  Past Medical History:  Diagnosis Date  . ASTHMA NOS W/ACUTE EXACERBATION 07/10/2010  . Cervical dysplasia   . DEPRESSION 03/16/2007  . DIABETES MELLITUS, TYPE II 12/01/2006  . Dysmenorrhea   . Fibroid   . HYPERLIPIDEMIA 12/01/2006  . HYPERTENSION 07/07/2006  . Obesity   . Osteopenia 05/2017   T score -1.5 FRAX 2.6% / 0.1%  . Retinal edema    gets steriod inj in the eyes    . Sleep apnea    HAS CPAP BUT ADMITS DOES NOT USE JUDICIALLY   . Splenomegaly    in college  . Stroke [facility]) 2012   R pontine, mild residual left hemiparesis  . TIA (transient ischemic attack) 2012   2 TIAs 1 week apart of each other     Patient Active Problem List   Diagnosis Date Noted  . 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 Constipation 08/19/2017  . Greater trochanteric bursitis of both hips 12/19/2016  . OSA (obstructive sleep apnea) 07/26/2016  . Hypersomnia 06/11/2016  . Diabetes ([place]) 07/19/2015  . History of colonic polyps 12/01/2014  . Morbidly obese ([place]) 10/05/2013  . Partial nontraumatic tear of right rotator cuff 09/21/2013  . Goiter 12/10/2011  . Asthma 12/10/2011  . 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 TIA (transient ischemic attack) 02/13/2011    . PROTEINURIA, MILD 09/15/2009  . Hyperlipidemia 12/01/2006  . Essential hypertension 07/07/2006    Past Surgical History:  Procedure Laterality Date  . 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 APPENDECTOMY    . BIOPSY THYROID  05/02/11   Nonneoplastic goiter  . BREAST BIOPSY    . BREAST LUMPECTOMY WITH RADIOACTIVE SEED LOCALIZATION Left 07/25/2017   Procedure: LEFT BREAST LUMPECTOMY WITH RADIOACTIVE SEED LOCALIZATION;  Surgeon: Coralie Keens, MD;  Location: [facility];  Service: General;  Laterality: Left;  . BREAST SURGERY     Reduction  . COLPOSCOPY    . DILATION AND CURETTAGE OF UTERUS  1975   DUB  . EYE SURGERY     Laser  . GASTRIC ROUX-EN-Y N/A 08/12/2017   Procedure: LAPAROSCOPIC ROUX-EN-Y GASTRIC BYPASS WITH HIATAL HERNIA REPAIR AND UPPER ENDOSCOPY;  Surgeon: [name], [name], MD;  Location: [facility];  Service: General;  Laterality: N/A;  . GYNECOLOGIC CRYOSURGERY    . MYOMECTOMY    . OVARIAN CYST REMOVAL    . PELVIC LAPAROSCOPY  75,88   DL lysis of adhesions     OB History    Gravida  0   Para      Term      Preterm      AB      Living        SAB  TAB      Ectopic      Multiple      Live Births               Home Medications    Prior to Admission medications   Medication Sig Start Date End Date Taking? Authorizing Provider  albuterol (PROVENTIL HFA;VENTOLIN HFA) 108 (90 Base) MCG/ACT inhaler Inhale 2 puffs into the lungs every 4 (four) hours as needed for wheezing or shortness of breath. 07/23/17  Yes Burns, Claudina Lick, MD  amLODipine (NORVASC) 10 MG tablet Take 0.5 tablets (5 mg total) by mouth daily. 08/19/17  Yes Burns, Claudina Lick, MD  Biotin 5000 MCG TABS Take 5,000 mcg by mouth daily.    Yes [provider]  Calcium Carb-Ergocalciferol (CHEWABLE CALCIUM/D PO) Take 1 tablet by mouth 3 (three) times daily.   Yes [provider]  Carboxymethylcellul-Glycerin (LUBRICATING EYE DROPS OP) Place 1 drop into both eyes daily as needed  (dry eyes).   Yes [provider]  enoxaparin (LOVENOX) 40 MG/0.4ML injection Inject 0.4 mLs (40 mg total) into the skin daily for 30 doses. 08/14/17 09/22/17 Yes [name], [name], MD  losartan (COZAAR) 100 MG tablet TAKE 1 TAB BY MOUTH ONCE DAILY. 08/16/17  Yes Burns, Claudina Lick, MD  metoprolol succinate (TOPROL-XL) 100 MG 24 hr tablet TAKE 1 TABLET BY MOUTH DAILY WITH OR IMMEDIATELY FOLLOWING A MEAL 01/31/17  Yes Burns, Claudina Lick, MD  montelukast (SINGULAIR) 10 MG tablet Take 1 tablet (10 mg total) by mouth at bedtime. Patient taking differently: Take 10 mg by mouth daily.  01/15/17  Yes Burns, Claudina Lick, MD  Multiple Vitamin (MULTIVITAMIN) tablet Take 1 tablet by mouth 2 (two) times daily. Barimets   Yes [provider]  pantoprazole (PROTONIX) 40 MG tablet Take 40 mg by mouth daily. 07/19/17  Yes [provider]  pravastatin (PRAVACHOL) 20 MG tablet Take 1 tablet (20 mg total) by mouth daily. Patient taking differently: Take 20 mg by mouth at bedtime.  12/16/15  Yes Patel, Donika K, DO  sitaGLIPtin (JANUVIA) 100 MG tablet Take 1 tablet (100 mg total) by mouth daily. 08/29/17  Yes Renato Shin, MD  spironolactone (ALDACTONE) 25 MG tablet Take 1 tablet (25 mg total) by mouth daily. 08/19/17  Yes Binnie Rail, MD    Family History Family History  Problem Relation Age of Onset  . Cancer Maternal Grandmother        Colon Cancer  . Asthma Maternal Grandmother   . Diabetes Father   . Heart disease Father   . Hypertension Father   . Hyperlipidemia Father   . Hypertension Mother   . Heart disease Mother   . Ovarian cancer Mother   . Cancer Mother        Lung cancer  . Asthma Mother   . COPD Mother   . Hyperlipidemia Mother   . Diabetes Brother   . Hypertension Brother   . Kidney disease Brother   . Asthma Brother   . Heart disease Brother   . Hyperlipidemia Brother   . Cancer Brother        Prostate  . Graves' disease Sister   . Diabetes Sister   . Breast cancer  Sister 19  . Graves' disease Paternal Grandmother   . Hypertension Paternal Grandmother   . Heart disease Paternal Grandmother   . Alzheimer's disease Paternal Grandmother   . Cancer Maternal Grandfather   . Heart failure Brother   . Heart failure  Brother     Social History Social History   Tobacco Use  . Smoking status: Never Smoker  . Smokeless tobacco: Never Used  Substance Use Topics  . Alcohol use: Yes    Alcohol/week: 0.0 oz    Comment: very rare  . Drug use: No     Allergies   Quinapril   Review of Systems Review of Systems  Constitutional: Positive for fatigue. Negative for fever.  Respiratory: Negative for shortness of breath.   Cardiovascular: Negative for chest pain.  Gastrointestinal: Positive for nausea and vomiting. Negative for abdominal pain and diarrhea.  Genitourinary: Negative for dysuria and frequency.  All other systems reviewed and are negative.    Physical Exam Updated Vital Signs BP (!) 185/85   Pulse 95   Temp 99.1 F (37.3 C) (Oral)   Resp 20   Ht 5\' 6"  (1.676 m)   Wt 97.1 kg (214 lb)   SpO2 97%   BMI 34.54 kg/m   Physical Exam  Constitutional: She is oriented to person, place, and time. She appears well-developed and well-nourished. No distress.  Obese female in NAD. Fatigued appearing  HENT:  Head: Normocephalic and atraumatic.  Eyes: Pupils are equal, round, and reactive to light. Conjunctivae are normal. Right eye exhibits no discharge. Left eye exhibits no discharge. No scleral icterus.  Neck: Normal range of motion.  Cardiovascular: Normal rate and regular rhythm.  Pulmonary/Chest: Effort normal and breath sounds normal. No respiratory distress.  Abdominal: Soft. Bowel sounds are normal. She exhibits no distension. There is no tenderness.  Surgical scars appear well healed  Neurological: She is alert and oriented to person, place, and time.  Skin: Skin is warm and dry.  Psychiatric: She has a normal mood and affect. Her  behavior is normal.  Nursing note and vitals reviewed.    ED Treatments / Results  Labs (all labs ordered are listed, but only abnormal results are displayed) Labs Reviewed  COMPREHENSIVE METABOLIC PANEL - Abnormal; Notable for the following components:      Result Value   Chloride 100 (*)    Glucose, Bld 226 (*)    BUN 24 (*)    Creatinine, Ser 1.52 (*)    ALT 62 (*)    Total Bilirubin 1.6 (*)    GFR calc non Af Amer 36 (*)    GFR calc Af Amer 42 (*)    Anion gap 20 (*)    All other components within normal limits  CBC - Abnormal; Notable for the following components:   WBC 12.4 (*)    RBC 5.55 (*)    Hemoglobin 15.3 (*)    HCT 46.1 (*)    All other components within normal limits  LIPASE, BLOOD  URINALYSIS, ROUTINE W REFLEX MICROSCOPIC  I-STAT VENOUS BLOOD GAS, ED  I-STAT CG4 LACTIC ACID, ED    EKG None  Radiology No results found.  Procedures Procedures (including critical care time)  Medications Ordered in ED Medications  sodium chloride 0.9 % bolus 1,000 mL (has no administration in time range)  metoprolol succinate (TOPROL-XL) 24 hr tablet 100 mg (has no administration in time range)  amLODipine (NORVASC) tablet 5 mg (has no administration in time range)  losartan (COZAAR) tablet 100 mg (has no administration in time range)  ondansetron (ZOFRAN-ODT) disintegrating tablet 4 mg (4 mg Oral Given 09/22/17 1733)     Initial Impression / Assessment and Plan / ED Course  I have reviewed the triage vital signs and the nursing  notes.  Pertinent labs & imaging results that were available during my care of the patient were reviewed by me and considered in my medical decision making (see chart for details).  62 year old female presents with nausea and vomiting for several days.  She is markedly hypertensive which is likely due to not taking her blood pressure medicines.  Otherwise vital signs are normal.  Her abdomen is soft and nontender.  CBC is remarkable for mild  leukocytosis and elevated hemoglobin which is likely due to some hemoconcentration.  Her CMP is remarkable for hyperglycemia, elevated serum creatinine from her baseline, elevated anion gap.  VBG shows a normal pH.  Lactic acid is normal.  Urine is remarkable for 80 ketones, large leukocytes, rare bacteria, over 50 white blood cell.  She denies any urinary symptoms.  Will initiate fluids and nausea medicine and have her take her home blood pressure medicines. Will admit for AKI and dehydration. Shared visit with Dr. Colvin Caroli. Discussed with Dr. Hal Hope who will admit. He requests acute abdominal xray.  Final Clinical Impressions(s) / ED Diagnoses   Final diagnoses:  AKI (acute kidney injury) ([place])  Non-intractable vomiting with nausea, unspecified vomiting type  N&V (nausea and vomiting)    ED Discharge Orders    None       Recardo Evangelist, PA-C 09/23/17 0019    Charlesetta Shanks, MD 09/23/17 [phone]

## 2017-09-22 NOTE — ED Notes (Signed)
Made aware of elevated BP.  Down from previous set of vitals.  Working to get a room asap.

## 2017-09-22 NOTE — Telephone Encounter (Signed)
Pt s/p lap RNY on 3/25.  Daughter called on 5/4 stating her mother was feeling sick with abd pain, nausea since last wed.  Denies any fevers and is having bowel function.  I recommended she come to the ED to be evaluated.

## 2017-09-22 NOTE — ED Triage Notes (Signed)
Spoke with patient, had bariatric surgery 4 weeks ago and c/o vomiting, per Dr. Meda Coffee, pt needs to be evaluated in the ER. Pt agreeable to plan, family took her to ER

## 2017-09-23 ENCOUNTER — Other Ambulatory Visit: Payer: Self-pay

## 2017-09-23 ENCOUNTER — Encounter (HOSPITAL_COMMUNITY): Payer: Self-pay | Admitting: Internal Medicine

## 2017-09-23 DIAGNOSIS — E1169 Type 2 diabetes mellitus with other specified complication: Secondary | ICD-10-CM | POA: Diagnosis not present

## 2017-09-23 DIAGNOSIS — I160 Hypertensive urgency: Secondary | ICD-10-CM | POA: Diagnosis not present

## 2017-09-23 DIAGNOSIS — R112 Nausea with vomiting, unspecified: Secondary | ICD-10-CM

## 2017-09-23 DIAGNOSIS — N179 Acute kidney failure, unspecified: Secondary | ICD-10-CM | POA: Diagnosis not present

## 2017-09-23 DIAGNOSIS — E669 Obesity, unspecified: Secondary | ICD-10-CM | POA: Diagnosis not present

## 2017-09-23 LAB — CBG MONITORING, ED
GLUCOSE-CAPILLARY: 171 mg/dL — AB (ref 65–99)
Glucose-Capillary: 176 mg/dL — ABNORMAL HIGH (ref 65–99)
Glucose-Capillary: 187 mg/dL — ABNORMAL HIGH (ref 65–99)

## 2017-09-23 LAB — CBC
HCT: 46.4 % — ABNORMAL HIGH (ref 36.0–46.0)
HEMATOCRIT: 42 % (ref 36.0–46.0)
HEMOGLOBIN: 13.9 g/dL (ref 12.0–15.0)
Hemoglobin: 15.4 g/dL — ABNORMAL HIGH (ref 12.0–15.0)
MCH: 27.3 pg (ref 26.0–34.0)
MCH: 27.2 pg (ref 26.0–34.0)
MCHC: 33.2 g/dL (ref 30.0–36.0)
MCHC: 33.1 g/dL (ref 30.0–36.0)
MCV: 82.3 fL (ref 78.0–100.0)
MCV: 82.2 fL (ref 78.0–100.0)
PLATELETS: 217 10*3/uL (ref 150–400)
Platelets: 292 10*3/uL (ref 150–400)
RBC: 5.64 MIL/uL — ABNORMAL HIGH (ref 3.87–5.11)
RBC: 5.11 MIL/uL (ref 3.87–5.11)
RDW: 15 % (ref 11.5–15.5)
RDW: 14.9 % (ref 11.5–15.5)
WBC: 11 10*3/uL — AB (ref 4.0–10.5)
WBC: 11.8 10*3/uL — AB (ref 4.0–10.5)

## 2017-09-23 LAB — MAGNESIUM: Magnesium: 1.2 mg/dL — ABNORMAL LOW (ref 1.7–2.4)

## 2017-09-23 LAB — BASIC METABOLIC PANEL
Anion gap: 14 (ref 5–15)
BUN: 15 mg/dL (ref 6–20)
CO2: 28 mmol/L (ref 22–32)
CREATININE: 1.21 mg/dL — AB (ref 0.44–1.00)
Calcium: 8.9 mg/dL (ref 8.9–10.3)
Chloride: 104 mmol/L (ref 101–111)
GFR calc Af Amer: 55 mL/min — ABNORMAL LOW (ref >60)
GFR, EST NON AFRICAN AMERICAN: 47 mL/min — AB (ref >60)
Glucose, Bld: 200 mg/dL — ABNORMAL HIGH (ref 65–99)
Potassium: 3.2 mmol/L — ABNORMAL LOW (ref 3.5–5.1)
SODIUM: 146 mmol/L — AB (ref 135–145)

## 2017-09-23 LAB — GLUCOSE, CAPILLARY
GLUCOSE-CAPILLARY: 190 mg/dL — AB (ref 65–99)
GLUCOSE-CAPILLARY: 171 mg/dL — AB (ref 65–99)
GLUCOSE-CAPILLARY: 160 mg/dL — AB (ref 65–99)

## 2017-09-23 LAB — CREATININE, SERUM
CREATININE: 1.33 mg/dL — AB (ref 0.44–1.00)
GFR calc Af Amer: 49 mL/min — ABNORMAL LOW (ref >60)
GFR calc non Af Amer: 42 mL/min — ABNORMAL LOW (ref >60)

## 2017-09-23 MED ORDER — PROCHLORPERAZINE EDISYLATE 10 MG/2ML IJ SOLN
5.0000 mg | Freq: Once | INTRAMUSCULAR | Status: AC
  Administered 2017-09-23: 5 mg via INTRAVENOUS
  Filled 2017-09-23: qty 2

## 2017-09-23 MED ORDER — ENOXAPARIN SODIUM 40 MG/0.4ML ~~LOC~~ SOLN
40.0000 mg | SUBCUTANEOUS | Status: DC
  Administered 2017-09-23 – 2017-09-27 (×6): 40 mg via SUBCUTANEOUS
  Filled 2017-09-23 (×6): qty 0.4

## 2017-09-23 MED ORDER — ADULT MULTIVITAMIN W/MINERALS CH
1.0000 | ORAL_TABLET | Freq: Two times a day (BID) | ORAL | Status: DC
  Administered 2017-09-23 – 2017-09-27 (×3): 1 via ORAL
  Filled 2017-09-23 (×8): qty 1

## 2017-09-23 MED ORDER — PANTOPRAZOLE SODIUM 40 MG PO TBEC
40.0000 mg | DELAYED_RELEASE_TABLET | Freq: Every day | ORAL | Status: DC
  Administered 2017-09-23 – 2017-09-28 (×5): 40 mg via ORAL
  Filled 2017-09-23 (×6): qty 1

## 2017-09-23 MED ORDER — POTASSIUM CHLORIDE IN NACL 20-0.45 MEQ/L-% IV SOLN
INTRAVENOUS | Status: DC
  Administered 2017-09-23 – 2017-09-27 (×7): via INTRAVENOUS
  Filled 2017-09-23 (×10): qty 1000

## 2017-09-23 MED ORDER — MAGNESIUM SULFATE 2 GM/50ML IV SOLN
2.0000 g | Freq: Once | INTRAVENOUS | Status: AC
  Administered 2017-09-23: 2 g via INTRAVENOUS
  Filled 2017-09-23: qty 50

## 2017-09-23 MED ORDER — MONTELUKAST SODIUM 10 MG PO TABS
10.0000 mg | ORAL_TABLET | Freq: Every day | ORAL | Status: DC
  Administered 2017-09-23 – 2017-09-28 (×5): 10 mg via ORAL
  Filled 2017-09-23 (×6): qty 1

## 2017-09-23 MED ORDER — ALBUTEROL SULFATE (2.5 MG/3ML) 0.083% IN NEBU
2.5000 mg | INHALATION_SOLUTION | RESPIRATORY_TRACT | Status: DC | PRN
Start: 2017-09-23 — End: 2017-09-28

## 2017-09-23 MED ORDER — ACETAMINOPHEN 325 MG PO TABS: 650.0000 mg | ORAL_TABLET | Freq: Four times a day (QID) | ORAL | Status: DC | PRN

## 2017-09-23 MED ORDER — SODIUM CHLORIDE 0.9 % IV SOLN
1.0000 g | INTRAVENOUS | Status: DC
  Administered 2017-09-23 – 2017-09-25 (×3): 1 g via INTRAVENOUS
  Filled 2017-09-23 (×3): qty 10

## 2017-09-23 MED ORDER — PRAVASTATIN SODIUM 20 MG PO TABS
20.0000 mg | ORAL_TABLET | Freq: Every day | ORAL | Status: DC
  Administered 2017-09-23 – 2017-09-27 (×5): 20 mg via ORAL
  Filled 2017-09-23 (×5): qty 1

## 2017-09-23 MED ORDER — INSULIN ASPART 100 UNIT/ML ~~LOC~~ SOLN
0.0000 [IU] | SUBCUTANEOUS | Status: DC
  Administered 2017-09-23 – 2017-09-24 (×6): 2 [IU] via SUBCUTANEOUS
  Administered 2017-09-24: 1 [IU] via SUBCUTANEOUS
  Administered 2017-09-24: 2 [IU] via SUBCUTANEOUS
  Administered 2017-09-24 – 2017-09-27 (×8): 1 [IU] via SUBCUTANEOUS
  Administered 2017-09-27: 2 [IU] via SUBCUTANEOUS
  Filled 2017-09-23 (×2): qty 1

## 2017-09-23 MED ORDER — SODIUM CHLORIDE 0.9 % IV SOLN
INTRAVENOUS | Status: DC
  Administered 2017-09-23: 09:00:00 via INTRAVENOUS
  Administered 2017-09-23: 1000 mL via INTRAVENOUS

## 2017-09-23 MED ORDER — ACETAMINOPHEN 650 MG RE SUPP: 650.0000 mg | Freq: Four times a day (QID) | RECTAL | Status: DC | PRN

## 2017-09-23 MED ORDER — BIOTIN 5000 MCG PO TABS: 5000.0000 ug | ORAL_TABLET | Freq: Every day | ORAL | Status: DC

## 2017-09-23 MED ORDER — ONDANSETRON HCL 4 MG PO TABS: 4.0000 mg | ORAL_TABLET | Freq: Four times a day (QID) | ORAL | Status: DC | PRN

## 2017-09-23 MED ORDER — ONDANSETRON HCL 4 MG/2ML IJ SOLN
4.0000 mg | Freq: Four times a day (QID) | INTRAMUSCULAR | Status: DC | PRN
  Administered 2017-09-23 – 2017-09-26 (×6): 4 mg via INTRAVENOUS
  Filled 2017-09-23 (×6): qty 2

## 2017-09-23 MED ORDER — AMLODIPINE BESYLATE 5 MG PO TABS
5.0000 mg | ORAL_TABLET | Freq: Every day | ORAL | Status: DC
  Administered 2017-09-23 – 2017-09-28 (×5): 5 mg via ORAL
  Filled 2017-09-23 (×6): qty 1

## 2017-09-23 NOTE — Progress Notes (Signed)
  Progress Note: General Surgery Service   Assessment/Plan: Patient Active Problem List   Diagnosis Date Noted  . Diabetes mellitus type 2 in obese ([place]) 09/23/2017  . AKI (acute kidney injury) ([place])   . Nausea & vomiting 09/22/2017  . 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 Constipation 08/19/2017  . Hypertensive urgency 05/23/2017  . Greater trochanteric bursitis of both hips 12/19/2016  . OSA (obstructive sleep apnea) 07/26/2016  . Hypersomnia 06/11/2016  . Diabetes ([place]) 07/19/2015  . History of colonic polyps 12/01/2014  . Morbidly obese ([place]) 10/05/2013  . Partial nontraumatic tear of right rotator cuff 09/21/2013  . Goiter 12/10/2011  . Asthma 12/10/2011  . 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 TIA (transient ischemic attack) 02/13/2011  . PROTEINURIA, MILD 09/15/2009  . Hyperlipidemia 12/01/2006  . Essential hypertension 07/07/2006   S/p gastric bypass, 3 days of nausea and vomiting. -CT ab pelv to examine anatomy and contrast flow    LOS: 0 days  Chief Complaint/Subjective: No abdominal pain, continued nausea and vomiting  Objective: Vital signs in last 24 hours: Temp:  [98.7 F (37.1 C)-100.4 F (38 C)] 100.4 F (38 C) (05/06 1557) Pulse Rate:  [71-95] 78 (05/06 1557) Resp:  [13-21] 18 (05/06 1557) BP: (141-213)/(67-133) 172/89 (05/06 1557) SpO2:  [86 %-100 %] 95 % (05/06 1557) Weight:  [97.1 kg (214 lb)] 97.1 kg (214 lb) (05/05 1729)    Intake/Output from previous day: 05/05 0701 - 05/06 0700 In: 2100 [IV Piggyback:2100] Out: -  Intake/Output this shift: Total I/O In: 496.7 [I.V.:496.7] Out: -   Gen: NAD  Lungs: CTAB  Cardiovascular: RRR  Abd: soft, NT, ND, incisions well healed  Extremities: no edema  Neuro: AOx4  Studies/Results:  CBC    Component Value Date/Time   WBC 11.8 (H) 09/23/2017 0846   RBC 5.11 09/23/2017 0846   HGB 13.9 09/23/2017 0846   HCT 42.0 09/23/2017 0846   PLT 292 09/23/2017 0846   MCV 82.2 09/23/2017 0846   MCV 86.8 06/16/2013 1445   MCH 27.2 09/23/2017 0846   MCHC 33.1 09/23/2017 0846   RDW 14.9 09/23/2017 0846   LYMPHSABS 2.4 08/14/2017 0452   MONOABS 0.7 08/14/2017 0452   EOSABS 0.0 08/14/2017 0452   BASOSABS 0.0 08/14/2017 0452   CR up at 1.2 from baseline 0.6   Mickeal Skinner, MD [facility]

## 2017-09-23 NOTE — H&P (Addendum)
History and Physical    Rebekah Kennedy DOB: 09/09/1955 DOA: 09/22/2017  PCP: Binnie Rail, MD  Patient coming from: Home.  Chief Complaint: Nausea and vomiting.  HPI: Rebekah Kennedy is a 62 y.o. female with history of diabetes mellitus type 2, asthma who had a gastric sleeve surgery 2 months ago by Dr. Kieth Brightly presents to the ER with persistent nausea vomiting last 3 days.  Patient was weaned off of her long-acting insulin last month.  Has been on Januvia.  Denies any abdominal pain or diarrhea.  Denies any sick contacts or travel.  Last week patient had transient nausea vomiting which lasted for few hours and resolved.  At the time it was attributed to possible food related.  Patient denies any fever chills.  ED Course: In the ER patient remained persistently nauseated and patient blood pressure was elevated.  Sugar was elevated with anion gap around 20 but ABG does not show any acidosis.  Urine showed ketosis and possible UTI.  Acute abdominal series was unremarkable.  Abdomen appears benign on exam.  Patient admitted for further management of persistent nausea vomiting.  Review of Systems: As per HPI, rest all negative.   Past Medical History:  Diagnosis Date  . ASTHMA NOS W/ACUTE EXACERBATION 07/10/2010  . Cervical dysplasia   . DEPRESSION 03/16/2007  . DIABETES MELLITUS, TYPE II 12/01/2006  . Dysmenorrhea   . Fibroid   . HYPERLIPIDEMIA 12/01/2006  . HYPERTENSION 07/07/2006  . Obesity   . Osteopenia 05/2017   T score -1.5 FRAX 2.6% / 0.1%  . Retinal edema    gets steriod inj in the eyes    . Sleep apnea    HAS CPAP BUT ADMITS DOES NOT USE JUDICIALLY   . Splenomegaly    in college  . Stroke [facility]) 2012   R pontine, mild residual left hemiparesis  . TIA (transient ischemic attack) 2012   2 TIAs 1 week apart of each other     Past Surgical History:  Procedure Laterality Date  . 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 APPENDECTOMY    . BIOPSY THYROID  05/02/11    Nonneoplastic goiter  . BREAST BIOPSY    . BREAST LUMPECTOMY WITH RADIOACTIVE SEED LOCALIZATION Left 07/25/2017   Procedure: LEFT BREAST LUMPECTOMY WITH RADIOACTIVE SEED LOCALIZATION;  Surgeon: Coralie Keens, MD;  Location: [facility];  Service: General;  Laterality: Left;  . BREAST SURGERY     Reduction  . COLPOSCOPY    . DILATION AND CURETTAGE OF UTERUS  1975   DUB  . EYE SURGERY     Laser  . GASTRIC ROUX-EN-Y N/A 08/12/2017   Procedure: LAPAROSCOPIC ROUX-EN-Y GASTRIC BYPASS WITH HIATAL HERNIA REPAIR AND UPPER ENDOSCOPY;  Surgeon: [name], [name], MD;  Location: [facility];  Service: General;  Laterality: N/A;  . GYNECOLOGIC CRYOSURGERY    . MYOMECTOMY    . OVARIAN CYST REMOVAL    . PELVIC LAPAROSCOPY  75,88   DL lysis of adhesions     reports that she has never smoked. She has never used smokeless tobacco. She reports that she drinks alcohol. She reports that she does not use drugs.  Allergies  Allergen Reactions  . Quinapril Cough    Family History  Problem Relation Age of Onset  . Cancer Maternal Grandmother        Colon Cancer  . Asthma Maternal Grandmother   . Diabetes Father   . Heart disease Father   . Hypertension  Father   . Hyperlipidemia Father   . Hypertension Mother   . Heart disease Mother   . Ovarian cancer Mother   . Cancer Mother        Lung cancer  . Asthma Mother   . COPD Mother   . Hyperlipidemia Mother   . Diabetes Brother   . Hypertension Brother   . Kidney disease Brother   . Asthma Brother   . Heart disease Brother   . Hyperlipidemia Brother   . Cancer Brother        Prostate  . Graves' disease Sister   . Diabetes Sister   . Breast cancer Sister 22  . Graves' disease Paternal Grandmother   . Hypertension Paternal Grandmother   . Heart disease Paternal Grandmother   . Alzheimer's disease Paternal Grandmother   . Cancer Maternal Grandfather   . Heart failure Brother   . Heart failure Brother     Prior to Admission medications     Medication Sig Start Date End Date Taking? Authorizing Provider  albuterol (PROVENTIL HFA;VENTOLIN HFA) 108 (90 Base) MCG/ACT inhaler Inhale 2 puffs into the lungs every 4 (four) hours as needed for wheezing or shortness of breath. 07/23/17  Yes Burns, Claudina Lick, MD  amLODipine (NORVASC) 10 MG tablet Take 0.5 tablets (5 mg total) by mouth daily. 08/19/17  Yes Burns, Claudina Lick, MD  Biotin 5000 MCG TABS Take 5,000 mcg by mouth daily.    Yes [provider]  Calcium Carb-Ergocalciferol (CHEWABLE CALCIUM/D PO) Take 1 tablet by mouth 3 (three) times daily.   Yes [provider]  Carboxymethylcellul-Glycerin (LUBRICATING EYE DROPS OP) Place 1 drop into both eyes daily as needed (dry eyes).   Yes [provider]  enoxaparin (LOVENOX) 40 MG/0.4ML injection Inject 0.4 mLs (40 mg total) into the skin daily for 30 doses. 08/14/17 09/22/17 Yes [name], [name], MD  losartan (COZAAR) 100 MG tablet TAKE 1 TAB BY MOUTH ONCE DAILY. 08/16/17  Yes Burns, Claudina Lick, MD  metoprolol succinate (TOPROL-XL) 100 MG 24 hr tablet TAKE 1 TABLET BY MOUTH DAILY WITH OR IMMEDIATELY FOLLOWING A MEAL 01/31/17  Yes Burns, Claudina Lick, MD  montelukast (SINGULAIR) 10 MG tablet Take 1 tablet (10 mg total) by mouth at bedtime. Patient taking differently: Take 10 mg by mouth daily.  01/15/17  Yes Burns, Claudina Lick, MD  Multiple Vitamin (MULTIVITAMIN) tablet Take 1 tablet by mouth 2 (two) times daily. Barimets   Yes [provider]  pantoprazole (PROTONIX) 40 MG tablet Take 40 mg by mouth daily. 07/19/17  Yes [provider]  pravastatin (PRAVACHOL) 20 MG tablet Take 1 tablet (20 mg total) by mouth daily. Patient taking differently: Take 20 mg by mouth at bedtime.  12/16/15  Yes Patel, Donika K, DO  sitaGLIPtin (JANUVIA) 100 MG tablet Take 1 tablet (100 mg total) by mouth daily. 08/29/17  Yes Renato Shin, MD  spironolactone (ALDACTONE) 25 MG tablet Take 1 tablet (25 mg total) by mouth daily. 08/19/17  Yes Binnie Rail, MD    Physical Exam: Vitals:   09/22/17 2047 09/22/17 2100 09/22/17 2200 09/22/17 2300  BP:  (!) 197/83 (!) 206/86 (!) 213/97  Pulse:  91 92 94  Resp:      Temp: 99.1 F (37.3 C)     TempSrc: Oral     SpO2:  97% 97% 92%  Weight:      Height:          Constitutional: Moderately built and nourished.  Vitals:   09/22/17 2047 09/22/17 2100 09/22/17 2200 09/22/17 2300  BP:  (!) 197/83 (!) 206/86 (!) 213/97  Pulse:  91 92 94  Resp:      Temp: 99.1 F (37.3 C)     TempSrc: Oral     SpO2:  97% 97% 92%  Weight:      Height:       Eyes: Anicteric no pallor. ENMT: No discharge from the ears eyes nose or mouth. Neck: No mass felt.  No JVD appreciated. Respiratory: No rhonchi or crepitations. Cardiovascular: S1-S2 heard no murmurs appreciated. Abdomen: Soft nontender bowel sounds present. Musculoskeletal: No edema.  No joint effusion. Skin: No rash.  Skin appears warm. Neurologic: Alert awake oriented to time place and person.  Moves all extremities. Psychiatric: Appears normal.  Normal affect.   Labs on Admission: I have personally reviewed following labs and imaging studies  CBC: Recent Labs  Lab 09/22/17 1731  WBC 12.4*  HGB 15.3*  HCT 46.1*  MCV 83.1  PLT 161   Basic Metabolic Panel: Recent Labs  Lab 09/22/17 1731  NA 142  K 3.5  CL 100*  CO2 22  GLUCOSE 226*  BUN 24*  CREATININE 1.52*  CALCIUM 9.6   GFR: Estimated Creatinine Clearance: 45.7 mL/min (A) (by C-G formula based on SCr of 1.52 mg/dL (H)). Liver Function Tests: Recent Labs  Lab 09/22/17 1731  AST 30  ALT 62*  ALKPHOS 67  BILITOT 1.6*  PROT 7.9  ALBUMIN 4.0   Recent Labs  Lab 09/22/17 1731  LIPASE 49   No results for input(s): AMMONIA in the last 168 hours. Coagulation Profile: No results for input(s): INR, PROTIME in the last 168 hours. Cardiac Enzymes: No results for input(s): CKTOTAL, CKMB, CKMBINDEX, TROPONINI in the last 168 hours. BNP (last 3 results) No  results for input(s): PROBNP in the last 8760 hours. HbA1C: No results for input(s): HGBA1C in the last 72 hours. CBG: No results for input(s): GLUCAP in the last 168 hours. Lipid Profile: No results for input(s): CHOL, HDL, LDLCALC, TRIG, CHOLHDL, LDLDIRECT in the last 72 hours. Thyroid Function Tests: No results for input(s): TSH, T4TOTAL, FREET4, T3FREE, THYROIDAB in the last 72 hours. Anemia Panel: No results for input(s): VITAMINB12, FOLATE, FERRITIN, TIBC, IRON, RETICCTPCT in the last 72 hours. Urine analysis:    Component Value Date/Time   COLORURINE YELLOW 09/22/2017 2243   APPEARANCEUR HAZY (A) 09/22/2017 2243   LABSPEC 1.016 09/22/2017 2243   PHURINE 7.0 09/22/2017 2243   GLUCOSEU NEGATIVE 09/22/2017 2243   GLUCOSEU >=1000 (A) 06/21/2014 1549   HGBUR SMALL (A) 09/22/2017 2243   BILIRUBINUR NEGATIVE 09/22/2017 2243   BILIRUBINUR small 03/02/2014 1703   KETONESUR 80 (A) 09/22/2017 2243   PROTEINUR 100 (A) 09/22/2017 2243   UROBILINOGEN 0.2 06/21/2014 1549   NITRITE NEGATIVE 09/22/2017 2243   LEUKOCYTESUR LARGE (A) 09/22/2017 2243   Sepsis Labs: @LABRCNTIP (procalcitonin:4,lacticidven:4) )No results found for this or any previous visit (from the past 240 hour(s)).   Radiological Exams on Admission: No results found.    Assessment/Plan Principal Problem:   Nausea & vomiting Active Problems:   Asthma   OSA (obstructive sleep apnea)   Hypertensive urgency   S/P gastric sleeve   Diabetes mellitus type 2 in obese ([place])    1. Intractable nausea vomiting -acute abdominal series was unremarkable.  LFTs AST is only mildly elevated lipase is negative.  Abdomen appears benign on exam.  Not sure if patient is developing diabetic ketoacidosis.  Will keep patient on clear liquid diet and closely monitor.  If persistent may discuss with patient's general surgeon Dr. Kieth Brightly who had recently done gastric sleeve surgery. 2. Diabetes mellitus type 2 uncontrolled with  hyperglycemia -initial anion gap was elevated.  VBG does not show any acidosis.  Urine does show some ketosis.  Will continue with aggressive hydration place patient on sliding scale coverage and recheck metabolic panel.  If repeat metabolic panel still shows elevated anion gap may need to treat for diabetic ketoacidosis. 3. Hypertensive urgency -secondary patient not able to take her medications last 2 days.  Patient was able to take her home meds in the ER.  We will continue home medication except Cozaar due to acute renal failure.  Will place patient on PRN IV hydralazine.  Denies any headache or visual symptoms or any focal deficits. 4. Acute renal failure likely from persistent nausea vomiting. -Closely follow intake output metabolic panel.  Hold Cozaar and Spironolactone. 5. History of asthma presently not wheezing. 6. History of sleep apnea per the chart patient states he does not use CPAP. 7. Recent gastric sleeve surgery.   DVT prophylaxis: Lovenox. Code Status: Full code. Family Communication: Discussed with patient. Disposition Plan: Home. Consults called: None. Admission status: Observation.   [name] MD [facility] Pager [phone].  If 7PM-7AM, please contact night-coverage [email or web address] Password [email or web address]  09/23/2017, 12:07 AM

## 2017-09-23 NOTE — ED Notes (Addendum)
Pt went to the bathroom RN unable to get urine sample, pt aware of need

## 2017-09-23 NOTE — Progress Notes (Signed)
Patient admitted after midnight, please see H&P.  Still with N/V despite zofran.  Added compazine.  Consult to General surgery/bariatic surgery.  Close contact with recent GI bug. Na up, have changed IVF -possible UTI-- treated with IV abx before culture done  Eulogio Bear DO

## 2017-09-23 NOTE — Progress Notes (Signed)
Pharmacy Antibiotic Note  Rebekah Kennedy is a 62 y.o. female admitted on 09/22/2017 with UTI.  Pharmacy has been consulted for Ceftriaxone dosing. Recent bariatric surgery. WBC 12.4. Scr 1.52.   Plan: Ceftriaxone 1g IV q24h F/U urine culture   Height: 5\' 6"  (167.6 cm) Weight: 214 lb (97.1 kg) IBW/kg (Calculated) : 59.3  Temp (24hrs), Avg:99 F (37.2 C), Min:98.7 F (37.1 C), Max:99.5 F (37.5 C)  Recent Labs  Lab 09/22/17 1731 09/22/17 2249  WBC 12.4*  --   CREATININE 1.52*  --   LATICACIDVEN  --  1.85    Estimated Creatinine Clearance: 45.7 mL/min (A) (by C-G formula based on SCr of 1.52 mg/dL (H)).    Allergies  Allergen Reactions  . Quinapril Cough    Rebekah Kennedy 09/23/2017 12:17 AM

## 2017-09-24 ENCOUNTER — Observation Stay (HOSPITAL_COMMUNITY): Payer: BC Managed Care – PPO

## 2017-09-24 DIAGNOSIS — J45909 Unspecified asthma, uncomplicated: Secondary | ICD-10-CM | POA: Diagnosis not present

## 2017-09-24 DIAGNOSIS — I69354 Hemiplegia and hemiparesis following cerebral infarction affecting left non-dominant side: Secondary | ICD-10-CM | POA: Diagnosis not present

## 2017-09-24 DIAGNOSIS — E1169 Type 2 diabetes mellitus with other specified complication: Secondary | ICD-10-CM | POA: Diagnosis not present

## 2017-09-24 DIAGNOSIS — Z8349 Family history of other endocrine, nutritional and metabolic diseases: Secondary | ICD-10-CM | POA: Diagnosis not present

## 2017-09-24 DIAGNOSIS — Z8041 Family history of malignant neoplasm of ovary: Secondary | ICD-10-CM | POA: Diagnosis not present

## 2017-09-24 DIAGNOSIS — E049 Nontoxic goiter, unspecified: Secondary | ICD-10-CM | POA: Diagnosis present

## 2017-09-24 DIAGNOSIS — I160 Hypertensive urgency: Secondary | ICD-10-CM | POA: Diagnosis not present

## 2017-09-24 DIAGNOSIS — Z9889 Other specified postprocedural states: Secondary | ICD-10-CM | POA: Diagnosis not present

## 2017-09-24 DIAGNOSIS — Z6834 Body mass index (BMI) 34.0-34.9, adult: Secondary | ICD-10-CM | POA: Diagnosis not present

## 2017-09-24 DIAGNOSIS — Z841 Family history of disorders of kidney and ureter: Secondary | ICD-10-CM | POA: Diagnosis not present

## 2017-09-24 DIAGNOSIS — R112 Nausea with vomiting, unspecified: Secondary | ICD-10-CM | POA: Diagnosis not present

## 2017-09-24 DIAGNOSIS — Z9884 Bariatric surgery status: Secondary | ICD-10-CM | POA: Diagnosis not present

## 2017-09-24 DIAGNOSIS — I5032 Chronic diastolic (congestive) heart failure: Secondary | ICD-10-CM | POA: Diagnosis present

## 2017-09-24 DIAGNOSIS — N390 Urinary tract infection, site not specified: Secondary | ICD-10-CM | POA: Diagnosis present

## 2017-09-24 DIAGNOSIS — G43A0 Cyclical vomiting, not intractable: Secondary | ICD-10-CM | POA: Diagnosis not present

## 2017-09-24 DIAGNOSIS — B952 Enterococcus as the cause of diseases classified elsewhere: Secondary | ICD-10-CM | POA: Diagnosis present

## 2017-09-24 DIAGNOSIS — Z803 Family history of malignant neoplasm of breast: Secondary | ICD-10-CM | POA: Diagnosis not present

## 2017-09-24 DIAGNOSIS — N179 Acute kidney failure, unspecified: Secondary | ICD-10-CM | POA: Diagnosis not present

## 2017-09-24 DIAGNOSIS — E860 Dehydration: Secondary | ICD-10-CM | POA: Diagnosis present

## 2017-09-24 DIAGNOSIS — G4733 Obstructive sleep apnea (adult) (pediatric): Secondary | ICD-10-CM | POA: Diagnosis not present

## 2017-09-24 DIAGNOSIS — E669 Obesity, unspecified: Secondary | ICD-10-CM | POA: Diagnosis not present

## 2017-09-24 DIAGNOSIS — Z8249 Family history of ischemic heart disease and other diseases of the circulatory system: Secondary | ICD-10-CM | POA: Diagnosis not present

## 2017-09-24 DIAGNOSIS — Z825 Family history of asthma and other chronic lower respiratory diseases: Secondary | ICD-10-CM | POA: Diagnosis not present

## 2017-09-24 DIAGNOSIS — E785 Hyperlipidemia, unspecified: Secondary | ICD-10-CM | POA: Diagnosis present

## 2017-09-24 DIAGNOSIS — M8580 Other specified disorders of bone density and structure, unspecified site: Secondary | ICD-10-CM | POA: Diagnosis present

## 2017-09-24 DIAGNOSIS — Z833 Family history of diabetes mellitus: Secondary | ICD-10-CM | POA: Diagnosis not present

## 2017-09-24 DIAGNOSIS — Z820 Family history of epilepsy and other diseases of the nervous system: Secondary | ICD-10-CM | POA: Diagnosis not present

## 2017-09-24 DIAGNOSIS — I110 Hypertensive heart disease with heart failure: Secondary | ICD-10-CM | POA: Diagnosis present

## 2017-09-24 DIAGNOSIS — E1165 Type 2 diabetes mellitus with hyperglycemia: Secondary | ICD-10-CM | POA: Diagnosis present

## 2017-09-24 LAB — BASIC METABOLIC PANEL
Anion gap: 12 (ref 5–15)
BUN: 13 mg/dL (ref 6–20)
CALCIUM: 8.8 mg/dL — AB (ref 8.9–10.3)
CO2: 26 mmol/L (ref 22–32)
Chloride: 107 mmol/L (ref 101–111)
Creatinine, Ser: 1.03 mg/dL — ABNORMAL HIGH (ref 0.44–1.00)
GFR calc Af Amer: >60 mL/min (ref >60)
GFR calc non Af Amer: 57 mL/min — ABNORMAL LOW (ref >60)
GLUCOSE: 153 mg/dL — AB (ref 65–99)
Potassium: 3.1 mmol/L — ABNORMAL LOW (ref 3.5–5.1)
Sodium: 145 mmol/L (ref 135–145)

## 2017-09-24 LAB — CBC
HEMATOCRIT: 42.8 % (ref 36.0–46.0)
Hemoglobin: 14.1 g/dL (ref 12.0–15.0)
MCH: 27.3 pg (ref 26.0–34.0)
MCHC: 32.9 g/dL (ref 30.0–36.0)
MCV: 82.9 fL (ref 78.0–100.0)
PLATELETS: 311 10*3/uL (ref 150–400)
RBC: 5.16 MIL/uL — ABNORMAL HIGH (ref 3.87–5.11)
RDW: 15 % (ref 11.5–15.5)
WBC: 10.3 10*3/uL (ref 4.0–10.5)

## 2017-09-24 LAB — GLUCOSE, CAPILLARY
GLUCOSE-CAPILLARY: 148 mg/dL — AB (ref 65–99)
GLUCOSE-CAPILLARY: 141 mg/dL — AB (ref 65–99)
Glucose-Capillary: 129 mg/dL — ABNORMAL HIGH (ref 65–99)
Glucose-Capillary: 134 mg/dL — ABNORMAL HIGH (ref 65–99)
Glucose-Capillary: 151 mg/dL — ABNORMAL HIGH (ref 65–99)
Glucose-Capillary: 151 mg/dL — ABNORMAL HIGH (ref 65–99)

## 2017-09-24 MED ORDER — POTASSIUM CHLORIDE 10 MEQ/100ML IV SOLN
10.0000 meq | INTRAVENOUS | Status: AC
  Administered 2017-09-24 (×2): 10 meq via INTRAVENOUS
  Filled 2017-09-24: qty 100

## 2017-09-24 MED ORDER — PREMIER PROTEIN SHAKE
2.0000 [oz_av] | Freq: Four times a day (QID) | ORAL | Status: DC
  Administered 2017-09-24 – 2017-09-27 (×3): 2 [oz_av] via ORAL
  Filled 2017-09-24 (×22): qty 325.31

## 2017-09-24 MED ORDER — IOPAMIDOL (ISOVUE-300) INJECTION 61%
INTRAVENOUS | Status: AC
  Administered 2017-09-24: 30 mL via ORAL
  Filled 2017-09-24: qty 30

## 2017-09-24 MED ORDER — PROCHLORPERAZINE EDISYLATE 10 MG/2ML IJ SOLN
5.0000 mg | Freq: Four times a day (QID) | INTRAMUSCULAR | Status: DC | PRN
  Administered 2017-09-25: 5 mg via INTRAVENOUS
  Filled 2017-09-24: qty 2

## 2017-09-24 MED ORDER — IOPAMIDOL (ISOVUE-300) INJECTION 61%
30.0000 mL | Freq: Once | INTRAVENOUS | Status: AC | PRN
  Administered 2017-09-24: 30 mL via ORAL

## 2017-09-24 MED ORDER — IOHEXOL 300 MG/ML  SOLN
100.0000 mL | Freq: Once | INTRAMUSCULAR | Status: AC | PRN
  Administered 2017-09-24: 100 mL via INTRAVENOUS

## 2017-09-24 MED ORDER — MAGNESIUM SULFATE 2 GM/50ML IV SOLN
2.0000 g | Freq: Once | INTRAVENOUS | Status: AC
  Administered 2017-09-24: 2 g via INTRAVENOUS
  Filled 2017-09-24: qty 50

## 2017-09-24 MED ORDER — POTASSIUM CHLORIDE 10 MEQ/100ML IV SOLN
INTRAVENOUS | Status: AC
  Administered 2017-09-24: 10 meq via INTRAVENOUS
  Filled 2017-09-24: qty 100

## 2017-09-24 NOTE — Care Management Note (Addendum)
Case Management Note  Patient Details  Name: Rebekah Kennedy MRN: [id number] Date of Birth: 1955/10/12  Subjective/Objective:   History of gastric bypass,DM type 2,HTN, sleep apnea (does not use CPAP), asthma.  Admitted for Nausea/Vomiting.            Action/Plan: Consult to General surgery/bariatic surgery. NCM will continue to follow for discharge planning needs.  Expected Discharge Date:  To be determined            Expected Discharge Plan:    to be determined In-House Referral:   N/A  Discharge planning Services CM consult    Status of Service:   In progress will continue to follow.  If discussed at [place] of Stay Meetings, dates discussed:    Additional Comments:  Kristen Cardinal, RN 09/24/2017, 1:09 PM

## 2017-09-24 NOTE — Progress Notes (Signed)
PROGRESS NOTE    Rebekah Kennedy  QAS:[id number] DOB: Mar 12, 1956 DOA: 09/22/2017 PCP: Binnie Rail, MD   Outpatient Specialists:     Brief Narrative:   Rebekah Kennedy is a 62 y.o. female with history of diabetes mellitus type 2, asthma who had a gastric sleeve surgery 2 months ago by Dr. Kieth Brightly presents to the ER with persistent nausea vomiting last 3 days.  Patient was weaned off of her long-acting insulin last month.  Has been on Januvia.  Denies any abdominal pain or diarrhea.   Last week patient had transient nausea vomiting which lasted for few hours and resolved.  At the time it was attributed to possible food related.  Patient denies any fever chills.     Assessment & Plan:   Principal Problem:   Nausea & vomiting Active Problems:   Asthma   OSA (obstructive sleep apnea)   Hypertensive urgency   S/P gastric sleeve   Diabetes mellitus type 2 in obese (HCC)   Intractable nausea vomiting -acute abdominal series was unremarkable.  -appreciate Dr. Amie [place] consult: await CT scan of abd -patient stated she is still vomiting even sips of water  ?UTI -culture pending -IV abx  Diabetes mellitus type 2 uncontrolled with hyperglycemia  -SSI  Hypertensive urgency  -IV meds  Acute renal failure likely from persistent nausea vomiting.  -IVF - Hold Cozaar and Spironolactone.  Hypokalemia/hypomagnesium -replete  History of asthma -stable  History of sleep apnea per the chart patient states she does not use CPAP.  Recent gastric sleeve surgery.       DVT prophylaxis:  Lovenox   Code Status: Full Code   Family Communication:   Disposition Plan:     Consultants:   General surgery   Subjective: Still vomiting   Objective: Vitals:   09/23/17 2241 09/24/17 0413 09/24/17 0500 09/24/17 0756  BP: (!) 165/78 (!) 179/81  (!) 142/63  Pulse: 88 76  74  Resp:  16  18  Temp:  98.7 F (37.1 C)  98.6 F (37 C)  TempSrc:  Oral  Oral  SpO2:   97%  95%  Weight:   92 kg (202 lb 13.2 oz)   Height:        Intake/Output Summary (Last 24 hours) at 09/24/2017 1359 Last data filed at 09/24/2017 0955 Gross per 24 hour  Intake 1118.75 ml  Output 0 ml  Net 1118.75 ml   Filed Weights   09/22/17 1729 09/23/17 2100 09/24/17 0500  Weight: 97.1 kg (214 lb) 92 kg (202 lb 13.2 oz) 92 kg (202 lb 13.2 oz)    Examination:  General exam: Appears calm and comfortable  Respiratory system: Clear to auscultation. Respiratory effort normal. Cardiovascular system: S1 & S2 heard, RRR. No JVD, murmurs, rubs, gallops or clicks. No pedal edema. Gastrointestinal system: diminished bowel sounds, non-tender Central nervous system: Alert and oriented. No focal neurological deficits. Extremities: Symmetric 5 x 5 power. Skin: No rashes, lesions or ulcers Psychiatry: Judgement and insight appear normal. Mood & affect appropriate.     Data Reviewed: I have personally reviewed following labs and imaging studies  CBC: Recent Labs  Lab 09/22/17 1731 09/23/17 0152 09/23/17 0846 09/24/17 0713  WBC 12.4* 11.0* 11.8* 10.3  HGB 15.3* 15.4* 13.9 14.1  HCT 46.1* 46.4* 42.0 42.8  MCV 83.1 82.3 82.2 82.9  PLT 268 217 292 798   Basic Metabolic Panel: Recent Labs  Lab 09/22/17 1731 09/23/17 0152 09/23/17 0846 09/24/17 0713  NA 142  --  146* 145  K 3.5  --  3.2* 3.1*  CL 100*  --  104 107  CO2 22  --  28 26  GLUCOSE 226*  --  200* 153*  BUN 24*  --  15 13  CREATININE 1.52* 1.33* 1.21* 1.03*  CALCIUM 9.6  --  8.9 8.8*  MG  --   --  1.2*  --    GFR: Estimated Creatinine Clearance: 65.6 mL/min (A) (by C-G formula based on SCr of 1.03 mg/dL (H)). Liver Function Tests: Recent Labs  Lab 09/22/17 1731  AST 30  ALT 62*  ALKPHOS 67  BILITOT 1.6*  PROT 7.9  ALBUMIN 4.0   Recent Labs  Lab 09/22/17 1731  LIPASE 49   No results for input(s): AMMONIA in the last 168 hours. Coagulation Profile: No results for input(s): INR, PROTIME in the last  168 hours. Cardiac Enzymes: No results for input(s): CKTOTAL, CKMB, CKMBINDEX, TROPONINI in the last 168 hours. BNP (last 3 results) No results for input(s): PROBNP in the last 8760 hours. HbA1C: No results for input(s): HGBA1C in the last 72 hours. CBG: Recent Labs  Lab 09/23/17 2103 09/23/17 2359 09/24/17 0413 09/24/17 0747 09/24/17 1123  GLUCAP 160* 151* 151* 148* 129*   Lipid Profile: No results for input(s): CHOL, HDL, LDLCALC, TRIG, CHOLHDL, LDLDIRECT in the last 72 hours. Thyroid Function Tests: No results for input(s): TSH, T4TOTAL, FREET4, T3FREE, THYROIDAB in the last 72 hours. Anemia Panel: No results for input(s): VITAMINB12, FOLATE, FERRITIN, TIBC, IRON, RETICCTPCT in the last 72 hours. Urine analysis:    Component Value Date/Time   COLORURINE YELLOW 09/22/2017 2243   APPEARANCEUR HAZY (A) 09/22/2017 2243   LABSPEC 1.016 09/22/2017 2243   PHURINE 7.0 09/22/2017 2243   GLUCOSEU NEGATIVE 09/22/2017 2243   GLUCOSEU >=1000 (A) 06/21/2014 1549   HGBUR SMALL (A) 09/22/2017 2243   BILIRUBINUR NEGATIVE 09/22/2017 2243   BILIRUBINUR small 03/02/2014 1703   KETONESUR 80 (A) 09/22/2017 2243   PROTEINUR 100 (A) 09/22/2017 2243   UROBILINOGEN 0.2 06/21/2014 1549   NITRITE NEGATIVE 09/22/2017 2243   LEUKOCYTESUR LARGE (A) 09/22/2017 2243    )No results found for this or any previous visit (from the past 240 hour(s)).    Anti-infectives (From admission, onward)   Start     Dose/Rate Route Frequency Ordered Stop   09/23/17 0030  cefTRIAXone (ROCEPHIN) 1 g in sodium chloride 0.9 % 100 mL IVPB     1 g 200 mL/hr over 30 Minutes Intravenous Every 24 hours 09/23/17 0017         Radiology Studies: Dg Abd 2 Views  Result Date: 09/23/2017 CLINICAL DATA:  62 year old female with nausea vomiting. EXAM: ABDOMEN - 2 VIEW COMPARISON:  CT of the abdomen pelvis dated 09/15/2015 FINDINGS: There is no bowel dilatation or evidence of obstruction. No free air or radiopaque calculi.  The osseous structures and soft tissues are grossly unremarkable. IMPRESSION: No evidence of bowel obstruction. Electronically Signed   By: Anner Crete M.D.   On: 09/23/2017 00:33        Scheduled Meds: . amLODipine  5 mg Oral Daily  . enoxaparin (LOVENOX) injection  40 mg Subcutaneous Q24H  . insulin aspart  0-9 Units Subcutaneous Q4H  . metoprolol succinate  100 mg Oral Daily  . montelukast  10 mg Oral Daily  . multivitamin with minerals  1 tablet Oral BID  . pantoprazole  40 mg Oral Daily  . pravastatin  20 mg Oral QHS  Continuous Infusions: . 0.45 % NaCl with KCl 20 mEq / L 75 mL/hr at 09/23/17 1146  . cefTRIAXone (ROCEPHIN)  IV Stopped (09/24/17 0100)     LOS: 0 days    Time spent: 35 min    Geradine Girt, DO [facility] Pager [phone]  If 7PM-7AM, please contact night-coverage [email or web address] Password [email or web address] 09/24/2017, 1:59 PM

## 2017-09-24 NOTE — Progress Notes (Signed)
  Progress Note: General Surgery Service   Assessment/Plan: Patient Active Problem List   Diagnosis Date Noted  . Nausea and vomiting 09/24/2017  . Diabetes mellitus type 2 in obese ([place]) 09/23/2017  . AKI (acute kidney injury) ([place])   . Nausea & vomiting 09/22/2017  . 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 Constipation 08/19/2017  . Hypertensive urgency 05/23/2017  . Greater trochanteric bursitis of both hips 12/19/2016  . OSA (obstructive sleep apnea) 07/26/2016  . Hypersomnia 06/11/2016  . Diabetes ([place]) 07/19/2015  . History of colonic polyps 12/01/2014  . Morbidly obese ([place]) 10/05/2013  . Partial nontraumatic tear of right rotator cuff 09/21/2013  . Goiter 12/10/2011  . Asthma 12/10/2011  . 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 TIA (transient ischemic attack) 02/13/2011  . PROTEINURIA, MILD 09/15/2009  . Hyperlipidemia 12/01/2006  . Essential hypertension 07/07/2006   S/p gastric bypass. CT scan negative for bowel obstruction or anastomotic issue, question of bladder wall thickening. Patient still nauseated -bari fulls as tolerated, no plan for surgical intervention or additional interrogation -pt encouraged to ambulate    LOS: 0 days  Chief Complaint/Subjective: Continued nausea, no dysuria, vomited earlier in the day, no bowel movement  Objective: Vital signs in last 24 hours: Temp:  [98.6 F (37 C)-99.2 F (37.3 C)] 99.2 F (37.3 C) (05/07 1543) Pulse Rate:  [71-88] 71 (05/07 1543) Resp:  [16-18] 18 (05/07 1543) BP: (142-189)/(59-86) 169/81 (05/07 1543) SpO2:  [95 %-99 %] 99 % (05/07 1543) Weight:  [92 kg (202 lb 13.2 oz)] 92 kg (202 lb 13.2 oz) (05/07 0500)    Intake/Output from previous day: 05/06 0701 - 05/07 0700 In: 1315.4 [I.V.:1265.4; IV Piggyback:50] Out: 0  Intake/Output this shift: Total I/O In: 1698.8 [P.O.:300; I.V.:1198.8; IV Piggyback:200] Out: -   Gen: NAD  Lungs: CTAB  Cardiovascular: RRR  Abd: soft, NT, ND  Extremities: no  edema  Neuro: AOx4   Mickeal Skinner, MD [facility]

## 2017-09-25 LAB — CBC
HEMATOCRIT: 40.1 % (ref 36.0–46.0)
Hemoglobin: 13.3 g/dL (ref 12.0–15.0)
MCH: 27.4 pg (ref 26.0–34.0)
MCHC: 33.2 g/dL (ref 30.0–36.0)
MCV: 82.5 fL (ref 78.0–100.0)
Platelets: 253 10*3/uL (ref 150–400)
RBC: 4.86 MIL/uL (ref 3.87–5.11)
RDW: 15.5 % (ref 11.5–15.5)
WBC: 8.7 10*3/uL (ref 4.0–10.5)

## 2017-09-25 LAB — BASIC METABOLIC PANEL
ANION GAP: 13 (ref 5–15)
BUN: 11 mg/dL (ref 6–20)
CO2: 21 mmol/L — AB (ref 22–32)
Calcium: 8.5 mg/dL — ABNORMAL LOW (ref 8.9–10.3)
Chloride: 109 mmol/L (ref 101–111)
Creatinine, Ser: 0.92 mg/dL (ref 0.44–1.00)
GFR calc Af Amer: >60 mL/min (ref >60)
GFR calc non Af Amer: >60 mL/min (ref >60)
GLUCOSE: 112 mg/dL — AB (ref 65–99)
POTASSIUM: 3.5 mmol/L (ref 3.5–5.1)
Sodium: 143 mmol/L (ref 135–145)

## 2017-09-25 LAB — GLUCOSE, CAPILLARY
GLUCOSE-CAPILLARY: 99 mg/dL (ref 65–99)
GLUCOSE-CAPILLARY: 124 mg/dL — AB (ref 65–99)
GLUCOSE-CAPILLARY: 123 mg/dL — AB (ref 65–99)
Glucose-Capillary: 106 mg/dL — ABNORMAL HIGH (ref 65–99)
Glucose-Capillary: 124 mg/dL — ABNORMAL HIGH (ref 65–99)
Glucose-Capillary: 127 mg/dL — ABNORMAL HIGH (ref 65–99)

## 2017-09-25 LAB — URINE CULTURE

## 2017-09-25 MED ORDER — LOSARTAN POTASSIUM 50 MG PO TABS
50.0000 mg | ORAL_TABLET | Freq: Every day | ORAL | Status: DC
  Administered 2017-09-25 – 2017-09-28 (×4): 50 mg via ORAL
  Filled 2017-09-25 (×4): qty 1

## 2017-09-25 MED ORDER — AMOXICILLIN 500 MG PO CAPS
500.0000 mg | ORAL_CAPSULE | Freq: Two times a day (BID) | ORAL | Status: DC
  Administered 2017-09-25 – 2017-09-28 (×7): 500 mg via ORAL
  Filled 2017-09-25 (×8): qty 1

## 2017-09-25 NOTE — Progress Notes (Signed)
PROGRESS NOTE    Rebekah Kennedy  NWG:[id number] DOB: December 02, 1955 DOA: 09/22/2017 PCP: Binnie Rail, MD   Outpatient Specialists:     Brief Narrative:   Rebekah Kennedy is a 62 y.o. female with history of diabetes mellitus type 2, asthma who had a gastric sleeve surgery 2 months ago by Dr. Kieth Brightly presents to the ER with persistent nausea vomiting last 3 days.  Slow to improve.  ? UTI-- abx changed from rocephin to amoxicillin for e. Fecalis-- monitor for improvement in AM     Assessment & Plan:   Principal Problem:   Nausea & vomiting Active Problems:   Asthma   OSA (obstructive sleep apnea)   Hypertensive urgency   S/P gastric sleeve   Diabetes mellitus type 2 in obese (HCC)   Nausea and vomiting   Intractable nausea vomiting -acute abdominal series was unremarkable.  -appreciate Dr. Amie Portland consult: CT scan unrevealing of cause -patient still vomiting  E. Fecalis UTI -rocephin changed to amoxicillin -hope that patient's symptoms of N/V improve  Diabetes mellitus type 2 uncontrolled with hyperglycemia  -SSI  Hypertensive urgency  -IV meds PRN until able to tolerate PO consistantly  Acute renal failure likely from persistent nausea vomiting.  -IVF with improvement -resume ARB with close monitoring of Cr  Hypokalemia/hypomagnesium -repleted  History of asthma -stable  History of sleep apnea per the chart patient states she does not use CPAP.  Recent gastric sleeve surgery.       DVT prophylaxis:  Lovenox   Code Status: Full Code   Family Communication:   Disposition Plan:  Home once eating   Consultants:   General surgery   Subjective: Says she is still vomiting any thing she eats-- water etc  Objective: Vitals:   09/24/17 2012 09/25/17 0426 09/25/17 0500 09/25/17 0950  BP: (!) 174/71 (!) 155/67  (!) 177/80  Pulse: 68 85  82  Resp: 19 19  18   Temp: 97.9 F (36.6 C) 98 F (36.7 C)  98.8 F (37.1 C)  TempSrc: Oral  Oral  Oral  SpO2: 96% 100%  100%  Weight: 92 kg (202 lb 13.3 oz)  92 kg (202 lb 13.2 oz)   Height:        Intake/Output Summary (Last 24 hours) at 09/25/2017 1416 Last data filed at 09/25/2017 1300 Gross per 24 hour  Intake 1451.25 ml  Output 950 ml  Net 501.25 ml   Filed Weights   09/24/17 0500 09/24/17 2012 09/25/17 0500  Weight: 92 kg (202 lb 13.2 oz) 92 kg (202 lb 13.3 oz) 92 kg (202 lb 13.2 oz)    Examination:  General exam: in bed, NAD Respiratory system: no wheezing Cardiovascular system: rrr Gastrointestinal system: +Bs, NT Central nervous system: alert Extremities: moves all 4 ext     Data Reviewed: I have personally reviewed following labs and imaging studies  CBC: Recent Labs  Lab 09/22/17 1731 09/23/17 0152 09/23/17 0846 09/24/17 0713 09/25/17 0518  WBC 12.4* 11.0* 11.8* 10.3 8.7  HGB 15.3* 15.4* 13.9 14.1 13.3  HCT 46.1* 46.4* 42.0 42.8 40.1  MCV 83.1 82.3 82.2 82.9 82.5  PLT 268 217 292 311 578   Basic Metabolic Panel: Recent Labs  Lab 09/22/17 1731 09/23/17 0152 09/23/17 0846 09/24/17 0713 09/25/17 0518  NA 142  --  146* 145 143  K 3.5  --  3.2* 3.1* 3.5  CL 100*  --  104 107 109  CO2 22  --  28 26 21*  GLUCOSE 226*  --  200* 153* 112*  BUN 24*  --  15 13 11   CREATININE 1.52* 1.33* 1.21* 1.03* 0.92  CALCIUM 9.6  --  8.9 8.8* 8.5*  MG  --   --  1.2*  --   --    GFR: Estimated Creatinine Clearance: 73.4 mL/min (by C-G formula based on SCr of 0.92 mg/dL). Liver Function Tests: Recent Labs  Lab 09/22/17 1731  AST 30  ALT 62*  ALKPHOS 67  BILITOT 1.6*  PROT 7.9  ALBUMIN 4.0   Recent Labs  Lab 09/22/17 1731  LIPASE 49   No results for input(s): AMMONIA in the last 168 hours. Coagulation Profile: No results for input(s): INR, PROTIME in the last 168 hours. Cardiac Enzymes: No results for input(s): CKTOTAL, CKMB, CKMBINDEX, TROPONINI in the last 168 hours. BNP (last 3 results) No results for input(s): PROBNP in the last 8760  hours. HbA1C: No results for input(s): HGBA1C in the last 72 hours. CBG: Recent Labs  Lab 09/24/17 2012 09/25/17 0005 09/25/17 0425 09/25/17 0747 09/25/17 1145  GLUCAP 141* 127* 99 106* 124*   Lipid Profile: No results for input(s): CHOL, HDL, LDLCALC, TRIG, CHOLHDL, LDLDIRECT in the last 72 hours. Thyroid Function Tests: No results for input(s): TSH, T4TOTAL, FREET4, T3FREE, THYROIDAB in the last 72 hours. Anemia Panel: No results for input(s): VITAMINB12, FOLATE, FERRITIN, TIBC, IRON, RETICCTPCT in the last 72 hours. Urine analysis:    Component Value Date/Time   COLORURINE YELLOW 09/22/2017 2243   APPEARANCEUR HAZY (A) 09/22/2017 2243   LABSPEC 1.016 09/22/2017 2243   PHURINE 7.0 09/22/2017 2243   GLUCOSEU NEGATIVE 09/22/2017 2243   GLUCOSEU >=1000 (A) 06/21/2014 1549   HGBUR SMALL (A) 09/22/2017 2243   BILIRUBINUR NEGATIVE 09/22/2017 2243   BILIRUBINUR small 03/02/2014 1703   KETONESUR 80 (A) 09/22/2017 2243   PROTEINUR 100 (A) 09/22/2017 2243   UROBILINOGEN 0.2 06/21/2014 1549   NITRITE NEGATIVE 09/22/2017 2243   LEUKOCYTESUR LARGE (A) 09/22/2017 2243    ) Recent Results (from the past 240 hour(s))  Culture, Urine     Status: Abnormal   Collection Time: 09/22/17 11:21 PM  Result Value Ref Range Status   Specimen Description Urine  Final   Special Requests   Final    NONE Performed at [facility], [place] [address], [place], [place] [address]    Culture >=100,000 COLONIES/mL ENTEROCOCCUS FAECALIS (A)  Final   Report Status 09/25/2017 FINAL  Final   Organism ID, Bacteria ENTEROCOCCUS FAECALIS (A)  Final      Susceptibility   Enterococcus faecalis - MIC*    AMPICILLIN <=2 SENSITIVE Sensitive     VANCOMYCIN 1 SENSITIVE Sensitive     GENTAMICIN SYNERGY SENSITIVE Sensitive     * >=100,000 COLONIES/mL ENTEROCOCCUS FAECALIS      Anti-infectives (From admission, onward)   Start     Dose/Rate Route Frequency Ordered Stop   09/25/17 1230  amoxicillin  (AMOXIL) capsule 500 mg     500 mg Oral 2 times daily 09/25/17 1213     09/23/17 0030  cefTRIAXone (ROCEPHIN) 1 g in sodium chloride 0.9 % 100 mL IVPB  Status:  Discontinued     1 g 200 mL/hr over 30 Minutes Intravenous Every 24 hours 09/23/17 0017 09/25/17 1213       Radiology Studies: Ct Abdomen Pelvis W Contrast  Result Date: 09/24/2017 CLINICAL DATA:  Nausea, vomiting, history of gastric bypass EXAM: CT ABDOMEN AND PELVIS WITH CONTRAST TECHNIQUE: Multidetector  CT imaging of the abdomen and pelvis was performed using the standard protocol following bolus administration of intravenous contrast. CONTRAST:  149mL OMNIPAQUE IOHEXOL 300 MG/ML  SOLN COMPARISON:  Abdomen films of 09/22/2017 and CT abdomen pelvis of 09/15/2015 FINDINGS: Lower chest: Lung bases are clear. The heart is mildly enlarged. No pericardial effusion is seen. Hepatobiliary: The liver enhances with no focal abnormality and no ductal dilatation is seen. No definite calcified gallstones are noted within the gallbladder. Pancreas: The pancreas is normal in size and the pancreatic duct is not dilated. Spleen: The spleen is unremarkable. Adrenals/Urinary Tract: The adrenal glands appear normal. Kidneys enhance and there is no evidence of renal calculi or hydronephrosis. No renal mass is seen. The ureters appear normal in caliber. The urinary bladder is slightly thick-walled but no intraluminal bladder lesion is seen. Stomach/Bowel: Changes of prior gastrojejunostomy are noted. The anastomotic site does not appear to be edematous and no dilatation of bowel is seen. Surgical sutures in the left abdomen represent the distal anastomotic site with no abnormality noted by CT in that region. Small bowel is normal in caliber. Scattered colonic diverticuli are present. However the colon is relatively decompressed and difficult to evaluate. No obvious colonic abnormality is seen. The terminal ileum is unremarkable, and by history the appendix has  previously been resected. Vascular/Lymphatic: The abdominal aorta is normal in caliber with moderate abdominal aortic atherosclerosis present. No adenopathy is seen. Reproductive: The uterus is normal in size. Small ovarian cysts are most likely present with low-attenuation and both adnexa but no free fluid is noted within the pelvis. No enlarging adnexal lesion is seen when compared to the prior CT. Other: Areas of higher density within the subcutaneous fat and the right abdomen may represent injection sites. No abdominal wall hernia is noted. Musculoskeletal: The lumbar vertebrae are in normal alignment with normal intervertebral disc spaces. There are degenerative changes involving the facet joints of L3-4, L4-5, and L5-S1. The SI joints are corticated. IMPRESSION: 1. No explanation for the patient's symptoms is seen. No obvious abnormality of the gastric bypass is evident. 2. No renal or ureteral calculi are seen. 3. The urinary bladder is slightly thick walled, of questionable significance. Is there any clinical suspicion of cystitis? 4. Moderate abdominal aortic atherosclerosis. Electronically Signed   By: Ivar Drape M.D.   On: 09/24/2017 15:38        Scheduled Meds: . amLODipine  5 mg Oral Daily  . amoxicillin  500 mg Oral BID  . enoxaparin (LOVENOX) injection  40 mg Subcutaneous Q24H  . insulin aspart  0-9 Units Subcutaneous Q4H  . metoprolol succinate  100 mg Oral Daily  . montelukast  10 mg Oral Daily  . multivitamin with minerals  1 tablet Oral BID  . pantoprazole  40 mg Oral Daily  . pravastatin  20 mg Oral QHS  . protein supplement shake  2 oz Oral QID   Continuous Infusions: . 0.45 % NaCl with KCl 20 mEq / L 75 mL/hr at 09/25/17 0618     LOS: 1 day    Time spent: 35 min    Geradine Girt, DO [facility] Pager [phone]  If 7PM-7AM, please contact night-coverage [email or web address] Password [email or web address] 09/25/2017, 2:16 PM

## 2017-09-25 NOTE — Progress Notes (Signed)
Patient ambulated 100 feet in the hallway. Slightly unsteady gait noted. Denies discomfort. Dorthey Sawyer, RN

## 2017-09-25 NOTE — Progress Notes (Signed)
  Progress Note: General Surgery Service   Assessment/Plan: Patient Active Problem List   Diagnosis Date Noted  . Nausea and vomiting 09/24/2017  . Diabetes mellitus type 2 in obese ([place]) 09/23/2017  . AKI (acute kidney injury) ([place])   . Nausea & vomiting 09/22/2017  . 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 Constipation 08/19/2017  . Hypertensive urgency 05/23/2017  . Greater trochanteric bursitis of both hips 12/19/2016  . OSA (obstructive sleep apnea) 07/26/2016  . Hypersomnia 06/11/2016  . Diabetes ([place]) 07/19/2015  . History of colonic polyps 12/01/2014  . Morbidly obese ([place]) 10/05/2013  . Partial nontraumatic tear of right rotator cuff 09/21/2013  . Goiter 12/10/2011  . Asthma 12/10/2011  . 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 TIA (transient ischemic attack) 02/13/2011  . PROTEINURIA, MILD 09/15/2009  . Hyperlipidemia 12/01/2006  . Essential hypertension 07/07/2006   s/p    -Urine culture consistent with UTI from Enterococcus pan sensitive -zofran and compazine ordered for nausea/vomiting   LOS: 1 day  Chief Complaint/Subjective: Continued nausea and vomiting  Objective: Vital signs in last 24 hours: Temp:  [97.9 F (36.6 C)-99.2 F (37.3 C)] 98.8 F (37.1 C) (05/08 0950) Pulse Rate:  [68-85] 82 (05/08 0950) Resp:  [18-19] 18 (05/08 0950) BP: (155-177)/(67-81) 177/80 (05/08 0950) SpO2:  [96 %-100 %] 100 % (05/08 0950) Weight:  [92 kg (202 lb 13.2 oz)-92 kg (202 lb 13.3 oz)] 92 kg (202 lb 13.2 oz) (05/08 0500) Last BM Date: 09/24/17  Intake/Output from previous day: 05/07 0701 - 05/08 0700 In: 3260 [P.O.:660; I.V.:2400; IV Piggyback:200] Out: 800 [Urine:800] Intake/Output this shift: Total I/O In: 0  Out: 150 [Urine:150]  Gen: NAD  Abd: soft, NT, ND  Extremities: no edema  Neuro: AOx4   Mickeal Skinner, MD [facility]

## 2017-09-26 DIAGNOSIS — J45909 Unspecified asthma, uncomplicated: Secondary | ICD-10-CM

## 2017-09-26 LAB — BASIC METABOLIC PANEL
Anion gap: 8 (ref 5–15)
BUN: 9 mg/dL (ref 6–20)
CALCIUM: 8.4 mg/dL — AB (ref 8.9–10.3)
CHLORIDE: 109 mmol/L (ref 101–111)
CO2: 26 mmol/L (ref 22–32)
CREATININE: 0.9 mg/dL (ref 0.44–1.00)
GFR calc Af Amer: >60 mL/min (ref >60)
GFR calc non Af Amer: >60 mL/min (ref >60)
Glucose, Bld: 89 mg/dL (ref 65–99)
Potassium: 3.6 mmol/L (ref 3.5–5.1)
SODIUM: 143 mmol/L (ref 135–145)

## 2017-09-26 LAB — CBC
HCT: 36.9 % (ref 36.0–46.0)
HEMOGLOBIN: 11.8 g/dL — AB (ref 12.0–15.0)
MCH: 26.6 pg (ref 26.0–34.0)
MCHC: 32 g/dL (ref 30.0–36.0)
MCV: 83.1 fL (ref 78.0–100.0)
Platelets: 231 10*3/uL (ref 150–400)
RBC: 4.44 MIL/uL (ref 3.87–5.11)
RDW: 15.1 % (ref 11.5–15.5)
WBC: 7.8 10*3/uL (ref 4.0–10.5)

## 2017-09-26 LAB — GLUCOSE, CAPILLARY
GLUCOSE-CAPILLARY: 128 mg/dL — AB (ref 65–99)
Glucose-Capillary: 91 mg/dL (ref 65–99)
Glucose-Capillary: 84 mg/dL (ref 65–99)
Glucose-Capillary: 93 mg/dL (ref 65–99)
Glucose-Capillary: 140 mg/dL — ABNORMAL HIGH (ref 65–99)
Glucose-Capillary: 147 mg/dL — ABNORMAL HIGH (ref 65–99)

## 2017-09-26 LAB — MAGNESIUM: MAGNESIUM: 1.5 mg/dL — AB (ref 1.7–2.4)

## 2017-09-26 MED ORDER — METOCLOPRAMIDE HCL 5 MG/ML IJ SOLN
5.0000 mg | Freq: Three times a day (TID) | INTRAMUSCULAR | Status: DC
  Administered 2017-09-26 – 2017-09-28 (×6): 5 mg via INTRAVENOUS
  Filled 2017-09-26 (×6): qty 2

## 2017-09-26 MED ORDER — FLEET ENEMA 7-19 GM/118ML RE ENEM
1.0000 | ENEMA | Freq: Once | RECTAL | Status: AC
  Administered 2017-09-26: 1 via RECTAL
  Filled 2017-09-26: qty 1

## 2017-09-26 NOTE — Progress Notes (Signed)
PROGRESS NOTE        PATIENT DETAILS Name: Rebekah Kennedy Age: 62 y.o. Sex: female Date of Birth: 1956/01/03 Admit Date: 09/22/2017 Admitting Physician [name], MD IPJ:[email or web address], Claudina Lick, MD  Brief Narrative: Patient is a 62 y.o. female with prior history of DM-2, asthma-recent gastric sleeve surgery admitted with several days history of intractable nausea and vomiting.  Slowly improving with supportive-see below for further details  Subjective: Overall better but still continues to have nausea-last bowel movement was almost 6 days back.  Assessment/Plan: Intractable nausea with vomiting: Etiology uncertain-she has a long-standing history of diabetes-wonder if she has underlying undiagnosed gastroparesis-furthermore she has not had a bowel movement in 6 days.  Will give 1 dose of [place] enema-and starting scheduled low-dose Reglan.  Continue supportive care.  UTI: Continue with amoxicillin-urine culture was positive for enterococcus.  Not sure if the vomiting is related to UTI at this time.  Acute kidney injury: Likely hemodynamically mediated-resolved  DM-2: CBG stable-previously on insulin-but switch to oral agents after bypass surgery.  SSI  Hypertension: Controlled with losartan and metoprolol.  Bronchial asthma: Stable-continue with as needed bronchodilators  Recent history of gastric sleeve surgery: General surgery following  DVT Prophylaxis: Prophylactic Lovenox   Code Status: Full code   Family Communication: None at bedside  Disposition Plan: Remain inpatient-home hopefully in the next day or so once vomiting/nausea have improved/resolved  Antimicrobial agents: Anti-infectives (From admission, onward)   Start     Dose/Rate Route Frequency Ordered Stop   09/25/17 1230  amoxicillin (AMOXIL) capsule 500 mg     500 mg Oral 2 times daily 09/25/17 1213     09/23/17 0030  cefTRIAXone (ROCEPHIN) 1 g in sodium chloride 0.9 % 100 mL IVPB   Status:  Discontinued     1 g 200 mL/hr over 30 Minutes Intravenous Every 24 hours 09/23/17 0017 09/25/17 1213      Procedures: None  CONSULTS:  general surgery  Time spent: 25 minutes-Greater than 50% of this time was spent in counseling, explanation of diagnosis, planning of further management, and coordination of care.  MEDICATIONS: Scheduled Meds: . amLODipine  5 mg Oral Daily  . amoxicillin  500 mg Oral BID  . enoxaparin (LOVENOX) injection  40 mg Subcutaneous Q24H  . insulin aspart  0-9 Units Subcutaneous Q4H  . losartan  50 mg Oral Daily  . metoCLOPramide (REGLAN) injection  5 mg Intravenous TID AC  . metoprolol succinate  100 mg Oral Daily  . montelukast  10 mg Oral Daily  . multivitamin with minerals  1 tablet Oral BID  . pantoprazole  40 mg Oral Daily  . pravastatin  20 mg Oral QHS  . protein supplement shake  2 oz Oral QID   Continuous Infusions: . 0.45 % NaCl with KCl 20 mEq / L 50 mL/hr at 09/26/17 1144   PRN Meds:.acetaminophen **OR** acetaminophen, albuterol, hydrALAZINE, ondansetron **OR** ondansetron (ZOFRAN) IV, prochlorperazine   PHYSICAL EXAM: Vital signs: Vitals:   09/25/17 2029 09/26/17 0429 09/26/17 0500 09/26/17 0938  BP: (!) 142/67 (!) 153/65  139/72  Pulse: (!) 59 (!) 58  65  Resp: 19 18  18   Temp: 98.6 F (37 C)   98.6 F (37 C)  TempSrc:    Oral  SpO2: 99% 98%  99%  Weight: 92 kg (202 lb 13 oz)  92 kg (202 lb 13.2 oz)   Height:       Filed Weights   09/25/17 0500 09/25/17 2029 09/26/17 0500  Weight: 92 kg (202 lb 13.2 oz) 92 kg (202 lb 13 oz) 92 kg (202 lb 13.2 oz)   Body mass index is 32.74 kg/m.   General appearance :Awake, alert, not in any distress.  Eyes:, pupils equally reactive to light and accomodation,no scleral icterus. HEENT: Atraumatic and Normocephalic Neck: supple, no JVD. No cervical lymphadenopathy. Resp:Good air entry bilaterally, no added sounds  CVS: S1 S2 regular, no murmurs.  GI: Bowel sounds present,  Non tender and not distended with no gaurding, rigidity or rebound.No organomegaly Extremities: B/L Lower Ext shows no edema, both legs are warm to touch Neurology:  speech clear,Non focal, sensation is grossly intact. Psychiatric: Normal judgment and insight. Alert and oriented x 3. Normal mood. Musculoskeletal:No digital cyanosis Skin:No Rash, warm and dry Wounds:N/A  I have personally reviewed following labs and imaging studies  LABORATORY DATA: CBC: Recent Labs  Lab 09/23/17 0152 09/23/17 0846 09/24/17 0713 09/25/17 0518 09/26/17 0519  WBC 11.0* 11.8* 10.3 8.7 7.8  HGB 15.4* 13.9 14.1 13.3 11.8*  HCT 46.4* 42.0 42.8 40.1 36.9  MCV 82.3 82.2 82.9 82.5 83.1  PLT 217 292 311 253 643    Basic Metabolic Panel: Recent Labs  Lab 09/22/17 1731 09/23/17 0152 09/23/17 0846 09/24/17 0713 09/25/17 0518 09/26/17 0519  NA 142  --  146* 145 143 143  K 3.5  --  3.2* 3.1* 3.5 3.6  CL 100*  --  104 107 109 109  CO2 22  --  28 26 21* 26  GLUCOSE 226*  --  200* 153* 112* 89  BUN 24*  --  15 13 11 9   CREATININE 1.52* 1.33* 1.21* 1.03* 0.92 0.90  CALCIUM 9.6  --  8.9 8.8* 8.5* 8.4*  MG  --   --  1.2*  --   --  1.5*    GFR: Estimated Creatinine Clearance: 75 mL/min (by C-G formula based on SCr of 0.9 mg/dL).  Liver Function Tests: Recent Labs  Lab 09/22/17 1731  AST 30  ALT 62*  ALKPHOS 67  BILITOT 1.6*  PROT 7.9  ALBUMIN 4.0   Recent Labs  Lab 09/22/17 1731  LIPASE 49   No results for input(s): AMMONIA in the last 168 hours.  Coagulation Profile: No results for input(s): INR, PROTIME in the last 168 hours.  Cardiac Enzymes: No results for input(s): CKTOTAL, CKMB, CKMBINDEX, TROPONINI in the last 168 hours.  BNP (last 3 results) No results for input(s): PROBNP in the last 8760 hours.  HbA1C: No results for input(s): HGBA1C in the last 72 hours.  CBG: Recent Labs  Lab 09/25/17 1639 09/25/17 2028 09/26/17 0207 09/26/17 0428 09/26/17 0731  GLUCAP 123*  124* 91 84 93    Lipid Profile: No results for input(s): CHOL, HDL, LDLCALC, TRIG, CHOLHDL, LDLDIRECT in the last 72 hours.  Thyroid Function Tests: No results for input(s): TSH, T4TOTAL, FREET4, T3FREE, THYROIDAB in the last 72 hours.  Anemia Panel: No results for input(s): VITAMINB12, FOLATE, FERRITIN, TIBC, IRON, RETICCTPCT in the last 72 hours.  Urine analysis:    Component Value Date/Time   COLORURINE YELLOW 09/22/2017 2243   APPEARANCEUR HAZY (A) 09/22/2017 2243   LABSPEC 1.016 09/22/2017 2243   PHURINE 7.0 09/22/2017 2243   GLUCOSEU NEGATIVE 09/22/2017 2243   GLUCOSEU >=1000 (A) 06/21/2014 1549   HGBUR SMALL (A) 09/22/2017 2243  BILIRUBINUR NEGATIVE 09/22/2017 2243   BILIRUBINUR small 03/02/2014 1703   KETONESUR 80 (A) 09/22/2017 2243   PROTEINUR 100 (A) 09/22/2017 2243   UROBILINOGEN 0.2 06/21/2014 1549   NITRITE NEGATIVE 09/22/2017 2243   LEUKOCYTESUR LARGE (A) 09/22/2017 2243    Sepsis Labs: Lactic Acid, Venous    Component Value Date/Time   LATICACIDVEN 1.85 09/22/2017 2249    MICROBIOLOGY: Recent Results (from the past 240 hour(s))  Culture, Urine     Status: Abnormal   Collection Time: 09/22/17 11:21 PM  Result Value Ref Range Status   Specimen Description Urine  Final   Special Requests   Final    NONE Performed at [facility], [place] [address], [place], [place] [address]    Culture >=100,000 COLONIES/mL ENTEROCOCCUS FAECALIS (A)  Final   Report Status 09/25/2017 FINAL  Final   Organism ID, Bacteria ENTEROCOCCUS FAECALIS (A)  Final      Susceptibility   Enterococcus faecalis - MIC*    AMPICILLIN <=2 SENSITIVE Sensitive     VANCOMYCIN 1 SENSITIVE Sensitive     GENTAMICIN SYNERGY SENSITIVE Sensitive     * >=100,000 COLONIES/mL ENTEROCOCCUS FAECALIS    RADIOLOGY STUDIES/RESULTS: Ct Abdomen Pelvis W Contrast  Result Date: 09/24/2017 CLINICAL DATA:  Nausea, vomiting, history of gastric bypass EXAM: CT ABDOMEN AND PELVIS WITH CONTRAST  TECHNIQUE: Multidetector CT imaging of the abdomen and pelvis was performed using the standard protocol following bolus administration of intravenous contrast. CONTRAST:  13mL OMNIPAQUE IOHEXOL 300 MG/ML  SOLN COMPARISON:  Abdomen films of 09/22/2017 and CT abdomen pelvis of 09/15/2015 FINDINGS: Lower chest: Lung bases are clear. The heart is mildly enlarged. No pericardial effusion is seen. Hepatobiliary: The liver enhances with no focal abnormality and no ductal dilatation is seen. No definite calcified gallstones are noted within the gallbladder. Pancreas: The pancreas is normal in size and the pancreatic duct is not dilated. Spleen: The spleen is unremarkable. Adrenals/Urinary Tract: The adrenal glands appear normal. Kidneys enhance and there is no evidence of renal calculi or hydronephrosis. No renal mass is seen. The ureters appear normal in caliber. The urinary bladder is slightly thick-walled but no intraluminal bladder lesion is seen. Stomach/Bowel: Changes of prior gastrojejunostomy are noted. The anastomotic site does not appear to be edematous and no dilatation of bowel is seen. Surgical sutures in the left abdomen represent the distal anastomotic site with no abnormality noted by CT in that region. Small bowel is normal in caliber. Scattered colonic diverticuli are present. However the colon is relatively decompressed and difficult to evaluate. No obvious colonic abnormality is seen. The terminal ileum is unremarkable, and by history the appendix has previously been resected. Vascular/Lymphatic: The abdominal aorta is normal in caliber with moderate abdominal aortic atherosclerosis present. No adenopathy is seen. Reproductive: The uterus is normal in size. Small ovarian cysts are most likely present with low-attenuation and both adnexa but no free fluid is noted within the pelvis. No enlarging adnexal lesion is seen when compared to the prior CT. Other: Areas of higher density within the subcutaneous  fat and the right abdomen may represent injection sites. No abdominal wall hernia is noted. Musculoskeletal: The lumbar vertebrae are in normal alignment with normal intervertebral disc spaces. There are degenerative changes involving the facet joints of L3-4, L4-5, and L5-S1. The SI joints are corticated. IMPRESSION: 1. No explanation for the patient's symptoms is seen. No obvious abnormality of the gastric bypass is evident. 2. No renal or ureteral calculi are seen. 3.  The urinary bladder is slightly thick walled, of questionable significance. Is there any clinical suspicion of cystitis? 4. Moderate abdominal aortic atherosclerosis. Electronically Signed   By: Ivar Drape M.D.   On: 09/24/2017 15:38   Dg Abd 2 Views  Result Date: 09/23/2017 CLINICAL DATA:  62 year old female with nausea vomiting. EXAM: ABDOMEN - 2 VIEW COMPARISON:  CT of the abdomen pelvis dated 09/15/2015 FINDINGS: There is no bowel dilatation or evidence of obstruction. No free air or radiopaque calculi. The osseous structures and soft tissues are grossly unremarkable. IMPRESSION: No evidence of bowel obstruction. Electronically Signed   By: Anner Crete M.D.   On: 09/23/2017 00:33     LOS: 2 days   Oren Binet, MD  [facility] Pager:[phone]  If 7PM-7AM, please contact night-coverage [email or web address] Password [email or web address] 09/26/2017, 2:06 PM

## 2017-09-27 DIAGNOSIS — G43A0 Cyclical vomiting, not intractable: Secondary | ICD-10-CM

## 2017-09-27 DIAGNOSIS — Z9889 Other specified postprocedural states: Secondary | ICD-10-CM

## 2017-09-27 LAB — GLUCOSE, CAPILLARY
GLUCOSE-CAPILLARY: 132 mg/dL — AB (ref 65–99)
Glucose-Capillary: 113 mg/dL — ABNORMAL HIGH (ref 65–99)
Glucose-Capillary: 132 mg/dL — ABNORMAL HIGH (ref 65–99)
Glucose-Capillary: 165 mg/dL — ABNORMAL HIGH (ref 65–99)
Glucose-Capillary: 92 mg/dL (ref 65–99)

## 2017-09-27 LAB — BASIC METABOLIC PANEL
Anion gap: 9 (ref 5–15)
BUN: 8 mg/dL (ref 6–20)
CALCIUM: 8.7 mg/dL — AB (ref 8.9–10.3)
CHLORIDE: 108 mmol/L (ref 101–111)
CO2: 25 mmol/L (ref 22–32)
CREATININE: 0.85 mg/dL (ref 0.44–1.00)
GFR calc Af Amer: >60 mL/min (ref >60)
GFR calc non Af Amer: >60 mL/min (ref >60)
GLUCOSE: 133 mg/dL — AB (ref 65–99)
Potassium: 3.8 mmol/L (ref 3.5–5.1)
SODIUM: 142 mmol/L (ref 135–145)

## 2017-09-27 MED ORDER — POLYETHYLENE GLYCOL 3350 17 G PO PACK
17.0000 g | PACK | Freq: Every day | ORAL | Status: DC
  Administered 2017-09-27 – 2017-09-28 (×2): 17 g via ORAL
  Filled 2017-09-27 (×2): qty 1

## 2017-09-27 NOTE — Consult Note (Addendum)
[facility] Consult: 9:30 AM 09/27/2017  LOS: 3 days    Referring Provider: Dr Sloan Leiter  Primary Care Physician:  Binnie Rail, MD Primary Gastroenterologist:  Dr. Ardis Hughs Surgeon:  Dr Sherrill Raring.     Reason for Consultation:  nausea   HPI: Rebekah Kennedy is a 62 y.o. female.  Obese type 2 diabetic.  Asthma.  OSA, not c/w using CPAP.  Hx TIAs and CVA but no on ASA or Plavix etc at home.  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  CHF with grade 2 diastolic dysfx and LVEF 60 to 65%on 05/2017 echo.  Fatty liver noted on ultrasound 2008.   2009 Colonoscopy.  Routine screening.  Several rectal polyps, all HP type. UGI series 04/2016: Normal.  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 Wt/BMI of 07/24/17: 251 #/40.5. Current 202#/32.   Intra-op EGD, by Dr Redmond Pulling: 4.5 cm gastric pouch without evidence of air bubbles, width of the gastrojejunal anastomosis  was at least 2 cm.   Post op LFTs of 3/26: t bili, alk phos normal.  AST/ALT 89/145.     Pt doing well on bariatric diet after surgery.  Started Januvia, in place of Insulin, in early 08/2017. Takes daily PPI.  No abd pain, no narcotics.  Starting 1 week ago, acute onset of N/V.  Nausea constant, vomits up po.  No stools after that.  She had not had a bowel movement for a week but had large amount of stool after enema yesterday.  Started on Reglan 5 mg IV QID yesterday, initially vomited after this but last night and this AM, nausea better.  Tolerated 2 hard boiled eggs and tea this AM.    Dr. Kieth Brightly has been following as inpt.  He is not worried that there is any postsurgical complication.  09/22/17 2V  abdomen:  Unremarkable, no obstruction, no ileus.  No anastomotic leak. 09/24/17: CT ab/pelvis with contrast: normal post gastrojejunostomy anatomy.  No obstruction, dilation.  Urinary bladder wall thickened, ? Cystitis?  Moderated Abd aortic atherosclerosis.  T bili 1.6, alk phos 67.  AST/ALT 30/62.  Lipase 49.   Initial AKI has resolved with hydration.   WBCs 12.4 >> 7.8.   Hgb 15.3 >> 11.8, was ~ 12.5 at time of surgery 08/14/17.    Urine grew Enteroccocus Faecalis on 09/22/17.  On Abx.       Past Medical History:  Diagnosis Date  . ASTHMA NOS W/ACUTE EXACERBATION 07/10/2010  . Cervical dysplasia   . DEPRESSION 03/16/2007  . DIABETES MELLITUS, TYPE II 12/01/2006  . Dysmenorrhea   . Fibroid   . HYPERLIPIDEMIA 12/01/2006  . HYPERTENSION 07/07/2006  . Obesity   . Osteopenia 05/2017   T score -1.5 FRAX 2.6% / 0.1%  . Retinal edema    gets steriod inj in the eyes    . Sleep apnea    HAS CPAP BUT ADMITS  DOES NOT USE JUDICIALLY   . Splenomegaly    in college  . Stroke [employer]) 2012   R pontine, mild residual left hemiparesis  . TIA (transient ischemic attack) 2012   2 TIAs 1 week apart of each other     Past Surgical History:  Procedure Laterality Date  . 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 APPENDECTOMY    . BIOPSY THYROID  05/02/11   Nonneoplastic goiter  . BREAST BIOPSY    . BREAST LUMPECTOMY WITH RADIOACTIVE SEED LOCALIZATION Left 07/25/2017   Procedure: LEFT BREAST LUMPECTOMY WITH RADIOACTIVE SEED LOCALIZATION;  Surgeon: Coralie Keens, MD;  Location: [facility];  Service: General;  Laterality: Left;  . BREAST SURGERY     Reduction  . COLPOSCOPY    . DILATION AND CURETTAGE OF UTERUS  1975   DUB  . EYE SURGERY     Laser  . GASTRIC ROUX-EN-Y N/A 08/12/2017   Procedure: LAPAROSCOPIC ROUX-EN-Y GASTRIC BYPASS WITH HIATAL HERNIA REPAIR AND UPPER ENDOSCOPY;  Surgeon: [name], [name], MD;  Location: [facility];  Service: General;  Laterality: N/A;  . GYNECOLOGIC CRYOSURGERY    .  MYOMECTOMY    . OVARIAN CYST REMOVAL    . PELVIC LAPAROSCOPY  75,88   DL lysis of adhesions    Prior to Admission medications   Medication Sig Start Date End Date Taking? Authorizing Provider  albuterol (PROVENTIL HFA;VENTOLIN HFA) 108 (90 Base) MCG/ACT inhaler Inhale 2 puffs into the lungs every 4 (four) hours as needed for wheezing or shortness of breath. 07/23/17  Yes Burns, Claudina Lick, MD  amLODipine (NORVASC) 10 MG tablet Take 0.5 tablets (5 mg total) by mouth daily. 08/19/17  Yes Burns, Claudina Lick, MD  Biotin 5000 MCG TABS Take 5,000 mcg by mouth daily.    Yes [provider]  Calcium Carb-Ergocalciferol (CHEWABLE CALCIUM/D PO) Take 1 tablet by mouth 3 (three) times daily.   Yes [provider]  Carboxymethylcellul-Glycerin (LUBRICATING EYE DROPS OP) Place 1 drop into both eyes daily as needed (dry eyes).   Yes [provider]  enoxaparin (LOVENOX) 40 MG/0.4ML injection Inject 0.4 mLs (40 mg total) into the skin daily for 30 doses. 08/14/17 09/22/17 Yes [name], [name], MD  losartan (COZAAR) 100 MG tablet TAKE 1 TAB BY MOUTH ONCE DAILY. 08/16/17  Yes Burns, Claudina Lick, MD  metoprolol succinate (TOPROL-XL) 100 MG 24 hr tablet TAKE 1 TABLET BY MOUTH DAILY WITH OR IMMEDIATELY FOLLOWING A MEAL 01/31/17  Yes Burns, Claudina Lick, MD  montelukast (SINGULAIR) 10 MG tablet Take 1 tablet (10 mg total) by mouth at bedtime. Patient taking differently: Take 10 mg by mouth daily.  01/15/17  Yes Burns, Claudina Lick, MD  Multiple Vitamin (MULTIVITAMIN) tablet Take 1 tablet by mouth 2 (two) times daily. Barimets   Yes [provider]  pantoprazole (PROTONIX) 40 MG tablet Take 40 mg by mouth daily. 07/19/17  Yes [provider]  pravastatin (PRAVACHOL) 20 MG tablet Take 1 tablet (20 mg total) by mouth daily. Patient taking differently: Take 20 mg by mouth at bedtime.  12/16/15  Yes Patel, Donika K, DO  sitaGLIPtin (JANUVIA) 100 MG tablet Take 1 tablet (100 mg total) by mouth daily.  08/29/17  Yes Renato Shin, MD  spironolactone (ALDACTONE) 25 MG tablet Take 1 tablet (25 mg total) by mouth daily. 08/19/17  Yes Burns, Claudina Lick, MD    Scheduled Meds: . amLODipine  5 mg Oral Daily  . amoxicillin  500 mg Oral BID  .  enoxaparin (LOVENOX) injection  40 mg Subcutaneous Q24H  . insulin aspart  0-9 Units Subcutaneous Q4H  . losartan  50 mg Oral Daily  . metoCLOPramide (REGLAN) injection  5 mg Intravenous TID AC  . metoprolol succinate  100 mg Oral Daily  . montelukast  10 mg Oral Daily  . multivitamin with minerals  1 tablet Oral BID  . pantoprazole  40 mg Oral Daily  . pravastatin  20 mg Oral QHS  . protein supplement shake  2 oz Oral QID   Infusions: . 0.45 % NaCl with KCl 20 mEq / L 50 mL/hr at 09/26/17 2105   PRN Meds: acetaminophen **OR** acetaminophen, albuterol, hydrALAZINE, ondansetron **OR** ondansetron (ZOFRAN) IV, prochlorperazine   Allergies as of 09/22/2017 - Review Complete 09/22/2017  Allergen Reaction Noted  . Quinapril Cough 12/10/2011    Family History  Problem Relation Age of Onset  . Cancer Maternal Grandmother        Colon Cancer  . Asthma Maternal Grandmother   . Diabetes Father   . Heart disease Father   . Hypertension Father   . Hyperlipidemia Father   . Hypertension Mother   . Heart disease Mother   . Ovarian cancer Mother   . Cancer Mother        Lung cancer  . Asthma Mother   . COPD Mother   . Hyperlipidemia Mother   . Diabetes Brother   . Hypertension Brother   . Kidney disease Brother   . Asthma Brother   . Heart disease Brother   . Hyperlipidemia Brother   . Cancer Brother        Prostate  . Graves' disease Sister   . Diabetes Sister   . Breast cancer Sister 70  . Graves' disease Paternal Grandmother   . Hypertension Paternal Grandmother   . Heart disease Paternal Grandmother   . Alzheimer's disease Paternal Grandmother   . Cancer Maternal Grandfather   . Heart failure Brother   . Heart failure Brother      Social History   Socioeconomic History  . Marital status: Single    Spouse name: Not on file  . Number of children: 2  . Years of education: Not on file  . Highest education level: Not on file  Occupational History  . Occupation: Product manager: [employer]  . Financial resource strain: Not on file  . Food insecurity:    Worry: Not on file    Inability: Not on file  . Transportation needs:    Medical: Not on file    Non-medical: Not on file  Tobacco Use  . Smoking status: Never Smoker  . Smokeless tobacco: Never Used  Substance and Sexual Activity  . Alcohol use: Yes    Alcohol/week: 0.0 oz    Comment: very rare  . Drug use: No  . Sexual activity: Not Currently    Birth control/protection: Post-menopausal    Comment: 1st intercourse 62 yo-Fewer than 5 partners  Lifestyle  . Physical activity:    Days per week: Not on file    Minutes per session: Not on file  . Stress: Not on file  Relationships  . Social connections:    Talks on phone: Not on file    Gets together: Not on file    Attends religious service: Not on file    Active member of club or organization: Not on file    Attends meetings of clubs or organizations: Not on  file    Relationship status: Not on file  . Intimate partner violence:    Fear of current or ex partner: Not on file    Emotionally abused: Not on file    Physically abused: Not on file    Forced sexual activity: Not on file  Other Topics Concern  . 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   ENT:  No nose bleeds Pulm:  No SOB or cough CV:  No palpitations, no LE edema.  GU:  No hematuria, no frequency GI:  Per HPI Heme:  No unusual bleeding or bruising   Transfusions:  none Neuro:  No headaches, no peripheral tingling or numbness Derm:  No itching, no rash or sores.   Endocrine:  No sweats or chills.  No polyuria or dysuria Immunization:  Reviewed.   Travel:  None beyond local counties in last few months.    PHYSICAL EXAM: Vital signs in last 24 hours: Vitals:   09/27/17 0419 09/27/17 0907  BP: (!) 159/74 133/87  Pulse: (!) 59 63  Resp: 18 16  Temp: 98.3 F (36.8 C) 98.4 F (36.9 C)  SpO2: 96% 99%   Wt Readings from Last 3 Encounters:  09/26/17 202 lb 13.2 oz (92 kg)  08/29/17 222 lb 12.8 oz (101.1 kg)  08/28/17 225 lb 3.2 oz (102.2 kg)    General: obese, pleasant, comfortable.  Not ill looking Head:  No asymmetry or swelling  Eyes:  No icterus or pallor Ears:  Not HOH  Nose:  No congestion or discharge Mouth:  Moist and clear oral mm.  Tongue midline Neck:  No mass, TMG or JVD Lungs: Clear bilaterally.  No cough or dyspnea. Heart: RRR.  No MRG.  S1, S2 present. Abdomen: Soft.  Moderately obese.  Laparoscopic scars all well-healed.  Old, lower midline scar from previous myomectomy.  Active bowel sounds.  No tenderness..   Rectal: Deferred. Musc/Skeltl: No joint swelling, redness or significant deformity. Extremities: No CCE. Neurologic: Alert.  Oriented x3.  Good historian.  Moves all 4 limbs.  No tremor, no gross weakness or deficits. Skin: No rashes, no sores. Tattoos: None observed. Nodes: No cervical adenopathy. Psych: Calm, pleasant, cooperative.  Speech fluid.  Intake/Output from previous day: 05/09 0701 - 05/10 0700 In: 2362.1 [P.O.:720; I.V.:1642.1] Out: 450 [Urine:450] Intake/Output this shift: No intake/output data recorded.  LAB RESULTS: Recent Labs    09/25/17 0518 09/26/17 0519  WBC 8.7 7.8  HGB 13.3 11.8*  HCT 40.1 36.9  PLT 253 231   BMET Lab Results  Component Value Date   NA 143 09/26/2017   NA 143 09/25/2017   NA 145 09/24/2017   K 3.6 09/26/2017   K 3.5 09/25/2017   K 3.1 (L) 09/24/2017   CL 109 09/26/2017   CL 109 09/25/2017   CL 107 09/24/2017   CO2 26 09/26/2017   CO2 21 (L) 09/25/2017    CO2 26 09/24/2017   GLUCOSE 89 09/26/2017   GLUCOSE 112 (H) 09/25/2017   GLUCOSE 153 (H) 09/24/2017   BUN 9 09/26/2017   BUN 11 09/25/2017   BUN 13 09/24/2017   CREATININE 0.90 09/26/2017   CREATININE 0.92 09/25/2017   CREATININE 1.03 (H) 09/24/2017   CALCIUM 8.4 (L) 09/26/2017   CALCIUM 8.5 (L) 09/25/2017   CALCIUM 8.8 (L) 09/24/2017  LFT No results for input(s): PROT, ALBUMIN, AST, ALT, ALKPHOS, BILITOT, BILIDIR, IBILI in the last 72 hours. PT/INR Lab Results  Component Value Date   INR 0.98 07/22/2017   Hepatitis Panel No results for input(s): HEPBSAG, HCVAB, HEPAIGM, HEPBIGM in the last 72 hours. C-Diff No components found for: CDIFF Lipase     Component Value Date/Time   LIPASE 49 09/22/2017 1731      IMPRESSION:   *   Nausea, vomiting in pt who is 6 weeks post Roux-en Y bariatric gastrojejunostomy. 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  UTI may be responsible for her symptoms.  Question acute gastritis.  No signs of obstruction on imaging.  Has improved within the last 12 to 18 hours, corresponding with initiation of low-dose IV Reglan.  *   Enterococcal UTI.  Rocephin x 3 days >> Amoxicillin x 2 days.  *   Type 2 DM.  Previously but no longer insulin requiring.       PLAN:     *    Continue Reglan but prefer to keep this therapy short-term.  I.e. days or a few weeks at most.      Azucena Freed  09/27/2017, 9:30 AM Phone [phone]   Attending physician's note   I have taken a history, examined the patient and reviewed the chart. I agree with the Advanced Practitioner's note, impression and recommendations.  67 yr F with h/o type II DM, obesity s/p gastric bypass surgery 08/12/17 admitted with nausea and vomiting. She had severe constipation with no BM for few days prior to admission. Resolved after enema yesterday.   Nausea and vomiting likely multifactorial On Antibiotics for UTI Patient ate a full breakfast this morning with no nausea or  vomiting Ok to use low dose Reglan 84m Q6H as needed for short term Small frequent meals  Avoid simple carbohydrates or sugars  Start bowel regimen Miralax 1 capful BID with goal 1-2 soft bowel movements daily and avoid constipation  Do not recommend additional GI work up at this point if her symptoms are improving  Gastric emptying scan unlikely to be helpful in patients s/p gastric bypass surgery and EGD will be low yield as well given intra op EGD was unremarkable and no significant acute abnormality detected on CT abd & pelvis Will sign off, available if have additional questions or patients symptoms recur   reveiewed CT abd & pelvis K. VDenzil Magnuson, MD [phone]

## 2017-09-27 NOTE — Progress Notes (Signed)
PROGRESS NOTE        PATIENT DETAILS Name: Rebekah Kennedy Age: 62 y.o. Sex: female Date of Birth: 04-27-1956 Admit Date: 09/22/2017 Admitting Physician [name], MD [id number], Claudina Lick, MD  Brief Narrative: Patient is a 62 y.o. female with prior history of DM-2, asthma-recent gastric sleeve surgery admitted with several days history of intractable nausea and vomiting.  Slowly improving with supportive-see below for further details  Subjective: Although better-continues to have intermittent nausea and vomiting.  Her last episode of vomiting was yesterday evening-this morning she continues to complain of nausea.  Assessment/Plan: Intractable nausea with vomiting: Etiology uncertain-but given long-standing history of diabetes-she probably has undiagnosed gastroparesis.  Some improvement in symptoms after starting low-dose Reglan-continue supportive care.  She apparently had not had a bowel movement in 6 days-subsequently was given a enema on 5/9 with success.  Will place on MiraLAX.  We will continue to watch for 1 more day before consideration of discharge.  Have also consulted gastroenterology for their opinion.    UTI: A continue moxicillin-urine culture was positive for enterococcus.  I doubt that vomiting is related to UTI at this time.    Acute kidney injury: Soft-likely hemodynamically mediated.    DM-2: CBG stable-previously on insulin-but switch to oral agents after bypass surgery.  SSI  Hypertension: Controlled with losartan and metoprolol.  Bronchial asthma: Stable-continue with as needed bronchodilators  Recent history of gastric sleeve surgery: General surgery following  DVT Prophylaxis: Prophylactic Lovenox   Code Status: Full code   Family Communication: None at bedside  Disposition Plan: Remain inpatient-home hopefully tomorrow-if no longer vomiting.  Antimicrobial agents: Anti-infectives (From admission, onward)   Start      Dose/Rate Route Frequency Ordered Stop   09/25/17 1230  amoxicillin (AMOXIL) capsule 500 mg     500 mg Oral 2 times daily 09/25/17 1213     09/23/17 0030  cefTRIAXone (ROCEPHIN) 1 g in sodium chloride 0.9 % 100 mL IVPB  Status:  Discontinued     1 g 200 mL/hr over 30 Minutes Intravenous Every 24 hours 09/23/17 0017 09/25/17 1213      Procedures: None  CONSULTS:  general surgery  Time spent: 25 minutes-Greater than 50% of this time was spent in counseling, explanation of diagnosis, planning of further management, and coordination of care.  MEDICATIONS: Scheduled Meds: . amLODipine  5 mg Oral Daily  . amoxicillin  500 mg Oral BID  . enoxaparin (LOVENOX) injection  40 mg Subcutaneous Q24H  . insulin aspart  0-9 Units Subcutaneous Q4H  . losartan  50 mg Oral Daily  . metoCLOPramide (REGLAN) injection  5 mg Intravenous TID AC  . metoprolol succinate  100 mg Oral Daily  . montelukast  10 mg Oral Daily  . multivitamin with minerals  1 tablet Oral BID  . pantoprazole  40 mg Oral Daily  . polyethylene glycol  17 g Oral Daily  . pravastatin  20 mg Oral QHS  . protein supplement shake  2 oz Oral QID   Continuous Infusions: . 0.45 % NaCl with KCl 20 mEq / L 50 mL/hr at 09/26/17 2105   PRN Meds:.acetaminophen **OR** acetaminophen, albuterol, hydrALAZINE, ondansetron **OR** ondansetron (ZOFRAN) IV, prochlorperazine   PHYSICAL EXAM: Vital signs: Vitals:   09/26/17 1743 09/26/17 2050 09/27/17 0419 09/27/17 0907  BP: (!) 151/75 (!) 160/74 (!) 159/74 133/87  Pulse: 79 63 (!) 59 63  Resp: 18 18 18 16   Temp: 98.6 F (37 C)  98.3 F (36.8 C) 98.4 F (36.9 C)  TempSrc: Oral  Oral Oral  SpO2: 98% 99% 96% 99%  Weight:      Height:       Filed Weights   09/25/17 0500 09/25/17 2029 09/26/17 0500  Weight: 92 kg (202 lb 13.2 oz) 92 kg (202 lb 13 oz) 92 kg (202 lb 13.2 oz)   Body mass index is 32.74 kg/m.   General appearance :Awake, alert, not in any distress.  Eyes:, pupils  equally reactive to light and accomodation,no scleral icterus. HEENT: Atraumatic and Normocephalic Neck: supple, no JVD. Resp:Good air entry bilaterally, no rales or rhonchi CVS: S1 S2 regular, no murmurs.  GI: Bowel sounds present, Non tender and not distended with no gaurding, rigidity or rebound. Extremities: B/L Lower Ext shows no edema, both legs are warm to touch Neurology:  speech clear,Non focal, sensation is grossly intact. Psychiatric: Normal judgment and insight. Normal mood. Musculoskeletal:No digital cyanosis Skin:No Rash, warm and dry Wounds:N/A  I have personally reviewed following labs and imaging studies  LABORATORY DATA: CBC: Recent Labs  Lab 09/23/17 0152 09/23/17 0846 09/24/17 0713 09/25/17 0518 09/26/17 0519  WBC 11.0* 11.8* 10.3 8.7 7.8  HGB 15.4* 13.9 14.1 13.3 11.8*  HCT 46.4* 42.0 42.8 40.1 36.9  MCV 82.3 82.2 82.9 82.5 83.1  PLT 217 292 311 253 539    Basic Metabolic Panel: Recent Labs  Lab 09/23/17 0846 09/24/17 0713 09/25/17 0518 09/26/17 0519 09/27/17 0856  NA 146* 145 143 143 142  K 3.2* 3.1* 3.5 3.6 3.8  CL 104 107 109 109 108  CO2 28 26 21* 26 25  GLUCOSE 200* 153* 112* 89 133*  BUN 15 13 11 9 8   CREATININE 1.21* 1.03* 0.92 0.90 0.85  CALCIUM 8.9 8.8* 8.5* 8.4* 8.7*  MG 1.2*  --   --  1.5*  --     GFR: Estimated Creatinine Clearance: 79.4 mL/min (by C-G formula based on SCr of 0.85 mg/dL).  Liver Function Tests: Recent Labs  Lab 09/22/17 1731  AST 30  ALT 62*  ALKPHOS 67  BILITOT 1.6*  PROT 7.9  ALBUMIN 4.0   Recent Labs  Lab 09/22/17 1731  LIPASE 49   No results for input(s): AMMONIA in the last 168 hours.  Coagulation Profile: No results for input(s): INR, PROTIME in the last 168 hours.  Cardiac Enzymes: No results for input(s): CKTOTAL, CKMB, CKMBINDEX, TROPONINI in the last 168 hours.  BNP (last 3 results) No results for input(s): PROBNP in the last 8760 hours.  HbA1C: No results for input(s): HGBA1C  in the last 72 hours.  CBG: Recent Labs  Lab 09/26/17 2053 09/26/17 2317 09/27/17 0416 09/27/17 0752 09/27/17 1109  GLUCAP 147* 128* 113* 132* 132*    Lipid Profile: No results for input(s): CHOL, HDL, LDLCALC, TRIG, CHOLHDL, LDLDIRECT in the last 72 hours.  Thyroid Function Tests: No results for input(s): TSH, T4TOTAL, FREET4, T3FREE, THYROIDAB in the last 72 hours.  Anemia Panel: No results for input(s): VITAMINB12, FOLATE, FERRITIN, TIBC, IRON, RETICCTPCT in the last 72 hours.  Urine analysis:    Component Value Date/Time   COLORURINE YELLOW 09/22/2017 2243   APPEARANCEUR HAZY (A) 09/22/2017 2243   LABSPEC 1.016 09/22/2017 2243   PHURINE 7.0 09/22/2017 2243   GLUCOSEU NEGATIVE 09/22/2017 2243   GLUCOSEU >=1000 (A) 06/21/2014 1549   HGBUR SMALL (A) 09/22/2017  [place] 09/22/2017 2243   BILIRUBINUR small 03/02/2014 1703   KETONESUR 80 (A) 09/22/2017 2243   PROTEINUR 100 (A) 09/22/2017 2243   UROBILINOGEN 0.2 06/21/2014 1549   NITRITE NEGATIVE 09/22/2017 2243   LEUKOCYTESUR LARGE (A) 09/22/2017 2243    Sepsis Labs: Lactic Acid, Venous    Component Value Date/Time   LATICACIDVEN 1.85 09/22/2017 2249    MICROBIOLOGY: Recent Results (from the past 240 hour(s))  Culture, Urine     Status: Abnormal   Collection Time: 09/22/17 11:21 PM  Result Value Ref Range Status   Specimen Description Urine  Final   Special Requests   Final    NONE Performed at [facility], [place] [address], [place], [place] [address]    Culture >=100,000 COLONIES/mL ENTEROCOCCUS FAECALIS (A)  Final   Report Status 09/25/2017 FINAL  Final   Organism ID, Bacteria ENTEROCOCCUS FAECALIS (A)  Final      Susceptibility   Enterococcus faecalis - MIC*    AMPICILLIN <=2 SENSITIVE Sensitive     VANCOMYCIN 1 SENSITIVE Sensitive     GENTAMICIN SYNERGY SENSITIVE Sensitive     * >=100,000 COLONIES/mL ENTEROCOCCUS FAECALIS    RADIOLOGY STUDIES/RESULTS: Ct Abdomen Pelvis W  Contrast  Result Date: 09/24/2017 CLINICAL DATA:  Nausea, vomiting, history of gastric bypass EXAM: CT ABDOMEN AND PELVIS WITH CONTRAST TECHNIQUE: Multidetector CT imaging of the abdomen and pelvis was performed using the standard protocol following bolus administration of intravenous contrast. CONTRAST:  133mL OMNIPAQUE IOHEXOL 300 MG/ML  SOLN COMPARISON:  Abdomen films of 09/22/2017 and CT abdomen pelvis of 09/15/2015 FINDINGS: Lower chest: Lung bases are clear. The heart is mildly enlarged. No pericardial effusion is seen. Hepatobiliary: The liver enhances with no focal abnormality and no ductal dilatation is seen. No definite calcified gallstones are noted within the gallbladder. Pancreas: The pancreas is normal in size and the pancreatic duct is not dilated. Spleen: The spleen is unremarkable. Adrenals/Urinary Tract: The adrenal glands appear normal. Kidneys enhance and there is no evidence of renal calculi or hydronephrosis. No renal mass is seen. The ureters appear normal in caliber. The urinary bladder is slightly thick-walled but no intraluminal bladder lesion is seen. Stomach/Bowel: Changes of prior gastrojejunostomy are noted. The anastomotic site does not appear to be edematous and no dilatation of bowel is seen. Surgical sutures in the left abdomen represent the distal anastomotic site with no abnormality noted by CT in that region. Small bowel is normal in caliber. Scattered colonic diverticuli are present. However the colon is relatively decompressed and difficult to evaluate. No obvious colonic abnormality is seen. The terminal ileum is unremarkable, and by history the appendix has previously been resected. Vascular/Lymphatic: The abdominal aorta is normal in caliber with moderate abdominal aortic atherosclerosis present. No adenopathy is seen. Reproductive: The uterus is normal in size. Small ovarian cysts are most likely present with low-attenuation and both adnexa but no free fluid is noted  within the pelvis. No enlarging adnexal lesion is seen when compared to the prior CT. Other: Areas of higher density within the subcutaneous fat and the right abdomen may represent injection sites. No abdominal wall hernia is noted. Musculoskeletal: The lumbar vertebrae are in normal alignment with normal intervertebral disc spaces. There are degenerative changes involving the facet joints of L3-4, L4-5, and L5-S1. The SI joints are corticated. IMPRESSION: 1. No explanation for the patient's symptoms is seen. No obvious abnormality of the gastric bypass is evident. 2. No renal or ureteral calculi  are seen. 3. The urinary bladder is slightly thick walled, of questionable significance. Is there any clinical suspicion of cystitis? 4. Moderate abdominal aortic atherosclerosis. Electronically Signed   By: Ivar Drape M.D.   On: 09/24/2017 15:38   Dg Abd 2 Views  Result Date: 09/23/2017 CLINICAL DATA:  62 year old female with nausea vomiting. EXAM: ABDOMEN - 2 VIEW COMPARISON:  CT of the abdomen pelvis dated 09/15/2015 FINDINGS: There is no bowel dilatation or evidence of obstruction. No free air or radiopaque calculi. The osseous structures and soft tissues are grossly unremarkable. IMPRESSION: No evidence of bowel obstruction. Electronically Signed   By: Anner Crete M.D.   On: 09/23/2017 00:33     LOS: 3 days   Oren Binet, MD  [facility] Pager:[phone]  If 7PM-7AM, please contact night-coverage [email or web address] Password [email or web address] 09/27/2017, 3:02 PM

## 2017-09-28 DIAGNOSIS — G4733 Obstructive sleep apnea (adult) (pediatric): Secondary | ICD-10-CM

## 2017-09-28 LAB — BASIC METABOLIC PANEL
ANION GAP: 9 (ref 5–15)
BUN: 6 mg/dL (ref 6–20)
CHLORIDE: 108 mmol/L (ref 101–111)
CO2: 25 mmol/L (ref 22–32)
Calcium: 8.5 mg/dL — ABNORMAL LOW (ref 8.9–10.3)
Creatinine, Ser: 0.79 mg/dL (ref 0.44–1.00)
GFR calc Af Amer: >60 mL/min (ref >60)
GLUCOSE: 107 mg/dL — AB (ref 65–99)
POTASSIUM: 3.5 mmol/L (ref 3.5–5.1)
Sodium: 142 mmol/L (ref 135–145)

## 2017-09-28 LAB — GLUCOSE, CAPILLARY
GLUCOSE-CAPILLARY: 95 mg/dL (ref 65–99)
GLUCOSE-CAPILLARY: 94 mg/dL (ref 65–99)
GLUCOSE-CAPILLARY: 122 mg/dL — AB (ref 65–99)
Glucose-Capillary: 116 mg/dL — ABNORMAL HIGH (ref 65–99)

## 2017-09-28 MED ORDER — ONDANSETRON HCL 4 MG PO TABS: 4.0000 mg | ORAL_TABLET | Freq: Three times a day (TID) | ORAL | 0 refills | Status: DC | PRN

## 2017-09-28 MED ORDER — POLYETHYLENE GLYCOL 3350 17 G PO PACK: 17.0000 g | PACK | Freq: Every day | ORAL | 0 refills | Status: DC

## 2017-09-28 MED ORDER — METOCLOPRAMIDE HCL 5 MG PO TABS: 5.0000 mg | ORAL_TABLET | Freq: Three times a day (TID) | ORAL | 0 refills | Status: DC

## 2017-09-28 MED ORDER — AMOXICILLIN 500 MG PO CAPS: 500.0000 mg | ORAL_CAPSULE | Freq: Two times a day (BID) | ORAL | 0 refills | Status: DC

## 2017-09-28 MED ORDER — BISACODYL 5 MG PO TBEC: 10.0000 mg | DELAYED_RELEASE_TABLET | Freq: Every day | ORAL | 0 refills | Status: DC | PRN

## 2017-09-28 NOTE — Discharge Summary (Signed)
Rebekah Kennedy EPP:[id number] DOB: Oct 09, 1955 DOA: 09/22/2017  PCP: Binnie Rail, MD  Admit date: 09/22/2017  Discharge date: 09/28/2017  Admitted From: Home  Disposition:  Home   Recommendations for Outpatient Follow-up:   Follow up with PCP in 1-2 weeks  PCP Please obtain BMP/CBC, 2 view CXR in 1week,  (see Discharge instructions)   PCP Please follow up on the following pending results:    Home Health: None Equipment/Devices: None Consultations: GI Discharge Condition: Stable  CODE STATUS: Full   Diet Recommendation: Beriatric diet    Chief Complaint  Patient presents with  . Nausea     Brief history of present illness from the day of admission and additional interim summary    Patient is a 62 y.o. female with prior history of DM-2, asthma-recent gastric sleeve surgery admitted with several days history of intractable nausea and vomiting.  Slowly improving with supportive-see below for further details                                                                  Hospital Course    Intractable nausea with vomiting: Etiology uncertain but likely multifactorial caused by combination of obstipation/constipation with no bowel movements in the last 6 to 7 days prior to admission, UTI and possibly due to underlying anatomical changes post gastric sleeve placement.  She was treated conservatively with bowel rest, IV Reglan and bowel regimen, had a good bowel movement 2 days ago thereafter all symptoms have resolved.  She has been seen and cleared by [facility] for home discharge without any further work-up.  She will be given 5 days of q. before meals Reglan along with MiraLAX and Colace daily and Zofran as needed.  Will follow with PCP outpatient.  UTI:  Penicillin sensitive enterococcus UTI, finished  course with amoxicillin.   Acute kidney injury: Soft-likely hemodynamically mediated.   Now resolved.    DM-2: Stable continue home regimen unchanged.  Hypertension: Controlled with losartan and metoprolol.  Bronchial asthma: Stable-continue with as needed bronchodilators  Recent history of gastric sleeve surgery: General surgery following     Discharge diagnosis     Principal Problem:   Nausea & vomiting Active Problems:   Asthma   OSA (obstructive sleep apnea)   Hypertensive urgency   S/P gastric sleeve   Diabetes mellitus type 2 in obese (HCC)   Nausea and vomiting    Discharge instructions    Discharge Instructions    Discharge instructions   Complete by:  As directed    Follow with Primary MD Binnie Rail, MD in 7 days   Get CBC, CMP,   checked  by Primary MD in 5-7 days    Activity: As tolerated with Full fall precautions use walker/cane & assistance as needed  Disposition  Home   Diet:   Bariatric diet.  For Heart failure patients - Check your Weight same time everyday, if you gain over 2 pounds, or you develop in leg swelling, experience more shortness of breath or chest pain, call your Primary MD immediately. Follow Cardiac Low Salt Diet and 1.5 lit/day fluid restriction.  Special Instructions: If you have smoked or chewed Tobacco  in the last 2 yrs please stop smoking, stop any regular Alcohol  and or any Recreational drug use.  On your next visit with your primary care physician please Get Medicines reviewed and adjusted.  Please request your Prim.MD to go over all Hospital Tests and Procedure/Radiological results at the follow up, please get all Hospital records sent to your Prim MD by signing hospital release before you go home.  If you experience worsening of your admission symptoms, develop shortness of breath, life threatening emergency, suicidal or homicidal thoughts you must seek medical attention immediately by calling 911 or calling your  MD immediately  if symptoms less severe.  You Must read complete instructions/literature along with all the possible adverse reactions/side effects for all the Medicines you take and that have been prescribed to you. Take any new Medicines after you have completely understood and accpet all the possible adverse reactions/side effects.   Increase activity slowly   Complete by:  As directed       Discharge Medications   Allergies as of 09/28/2017      Reactions   Quinapril Cough      Medication List    TAKE these medications   albuterol 108 (90 Base) MCG/ACT inhaler Commonly known as:  PROVENTIL HFA;VENTOLIN HFA Inhale 2 puffs into the lungs every 4 (four) hours as needed for wheezing or shortness of breath.   amLODipine 10 MG tablet Commonly known as:  NORVASC Take 0.5 tablets (5 mg total) by mouth daily.   amoxicillin 500 MG capsule Commonly known as:  AMOXIL Take 1 capsule (500 mg total) by mouth 2 (two) times daily.   Biotin 5000 MCG Tabs Take 5,000 mcg by mouth daily.   bisacodyl 5 MG EC tablet Commonly known as:  DULCOLAX Take 2 tablets (10 mg total) by mouth daily as needed for moderate constipation.   CHEWABLE CALCIUM/D PO Take 1 tablet by mouth 3 (three) times daily.   enoxaparin 40 MG/0.4ML injection Commonly known as:  LOVENOX Inject 0.4 mLs (40 mg total) into the skin daily for 30 doses.   losartan 100 MG tablet Commonly known as:  COZAAR TAKE 1 TAB BY MOUTH ONCE DAILY.   LUBRICATING EYE DROPS OP Place 1 drop into both eyes daily as needed (dry eyes).   metoCLOPramide 5 MG tablet Commonly known as:  REGLAN Take 1 tablet (5 mg total) by mouth 3 (three) times daily before meals for 5 days.   metoprolol succinate 100 MG 24 hr tablet Commonly known as:  TOPROL-XL TAKE 1 TABLET BY MOUTH DAILY WITH OR IMMEDIATELY FOLLOWING A MEAL   montelukast 10 MG tablet Commonly known as:  SINGULAIR Take 1 tablet (10 mg total) by mouth at bedtime. What changed:   when to take this   multivitamin tablet Take 1 tablet by mouth 2 (two) times daily. Barimets   ondansetron 4 MG tablet Commonly known as:  ZOFRAN Take 1 tablet (4 mg total) by mouth every 8 (eight) hours as needed for nausea.   pantoprazole 40 MG tablet Commonly known as:  PROTONIX Take 40 mg by mouth daily.  polyethylene glycol packet Commonly known as:  MIRALAX / GLYCOLAX Take 17 g by mouth daily.   pravastatin 20 MG tablet Commonly known as:  PRAVACHOL Take 1 tablet (20 mg total) by mouth daily. What changed:  when to take this   sitaGLIPtin 100 MG tablet Commonly known as:  JANUVIA Take 1 tablet (100 mg total) by mouth daily.   spironolactone 25 MG tablet Commonly known as:  ALDACTONE Take 1 tablet (25 mg total) by mouth daily.       Follow-up Information    Binnie Rail, MD. Schedule an appointment as soon as possible for a visit in 1 week(s).   Specialty:  Internal Medicine Contact information: [place] [address] [phone]           Major procedures and Radiology Reports - PLEASE review detailed and final reports thoroughly  -         Ct Abdomen Pelvis W Contrast  Result Date: 09/24/2017 CLINICAL DATA:  Nausea, vomiting, history of gastric bypass EXAM: CT ABDOMEN AND PELVIS WITH CONTRAST TECHNIQUE: Multidetector CT imaging of the abdomen and pelvis was performed using the standard protocol following bolus administration of intravenous contrast. CONTRAST:  144mL OMNIPAQUE IOHEXOL 300 MG/ML  SOLN COMPARISON:  Abdomen films of 09/22/2017 and CT abdomen pelvis of 09/15/2015 FINDINGS: Lower chest: Lung bases are clear. The heart is mildly enlarged. No pericardial effusion is seen. Hepatobiliary: The liver enhances with no focal abnormality and no ductal dilatation is seen. No definite calcified gallstones are noted within the gallbladder. Pancreas: The pancreas is normal in size and the pancreatic duct is not dilated. Spleen: The spleen is  unremarkable. Adrenals/Urinary Tract: The adrenal glands appear normal. Kidneys enhance and there is no evidence of renal calculi or hydronephrosis. No renal mass is seen. The ureters appear normal in caliber. The urinary bladder is slightly thick-walled but no intraluminal bladder lesion is seen. Stomach/Bowel: Changes of prior gastrojejunostomy are noted. The anastomotic site does not appear to be edematous and no dilatation of bowel is seen. Surgical sutures in the left abdomen represent the distal anastomotic site with no abnormality noted by CT in that region. Small bowel is normal in caliber. Scattered colonic diverticuli are present. However the colon is relatively decompressed and difficult to evaluate. No obvious colonic abnormality is seen. The terminal ileum is unremarkable, and by history the appendix has previously been resected. Vascular/Lymphatic: The abdominal aorta is normal in caliber with moderate abdominal aortic atherosclerosis present. No adenopathy is seen. Reproductive: The uterus is normal in size. Small ovarian cysts are most likely present with low-attenuation and both adnexa but no free fluid is noted within the pelvis. No enlarging adnexal lesion is seen when compared to the prior CT. Other: Areas of higher density within the subcutaneous fat and the right abdomen may represent injection sites. No abdominal wall hernia is noted. Musculoskeletal: The lumbar vertebrae are in normal alignment with normal intervertebral disc spaces. There are degenerative changes involving the facet joints of L3-4, L4-5, and L5-S1. The SI joints are corticated. IMPRESSION: 1. No explanation for the patient's symptoms is seen. No obvious abnormality of the gastric bypass is evident. 2. No renal or ureteral calculi are seen. 3. The urinary bladder is slightly thick walled, of questionable significance. Is there any clinical suspicion of cystitis? 4. Moderate abdominal aortic atherosclerosis. Electronically  Signed   By: Ivar Drape M.D.   On: 09/24/2017 15:38   Dg Abd 2 Views  Result Date:  09/23/2017 CLINICAL DATA:  62 year old female with nausea vomiting. EXAM: ABDOMEN - 2 VIEW COMPARISON:  CT of the abdomen pelvis dated 09/15/2015 FINDINGS: There is no bowel dilatation or evidence of obstruction. No free air or radiopaque calculi. The osseous structures and soft tissues are grossly unremarkable. IMPRESSION: No evidence of bowel obstruction. Electronically Signed   By: Anner Crete M.D.   On: 09/23/2017 00:33    Micro Results     Recent Results (from the past 240 hour(s))  Culture, Urine     Status: Abnormal   Collection Time: 09/22/17 11:21 PM  Result Value Ref Range Status   Specimen Description Urine  Final   Special Requests   Final    NONE Performed at [facility], [address] [place], [place] [address]    Culture >=100,000 COLONIES/mL ENTEROCOCCUS FAECALIS (A)  Final   Report Status 09/25/2017 FINAL  Final   Organism ID, Bacteria ENTEROCOCCUS FAECALIS (A)  Final      Susceptibility   Enterococcus faecalis - MIC*    AMPICILLIN <=2 SENSITIVE Sensitive     VANCOMYCIN 1 SENSITIVE Sensitive     GENTAMICIN SYNERGY SENSITIVE Sensitive     * >=100,000 COLONIES/mL ENTEROCOCCUS FAECALIS    Today   Subjective    Taniqua Issa today has no headache,no chest abdominal pain,no new weakness tingling or numbness, feels much better wants to go home today.     Objective   Blood pressure (!) 165/80, pulse 66, temperature 98.5 F (36.9 C), temperature source Oral, resp. rate 20, height 5\' 6"  (1.676 m), weight 95.7 kg (210 lb 14.4 oz), SpO2 100 %.   Intake/Output Summary (Last 24 hours) at 09/28/2017 1201 Last data filed at 09/28/2017 0915 Gross per 24 hour  Intake 1670 ml  Output 0 ml  Net 1670 ml    Exam  Awake Alert, Oriented x 3, No new F.N deficits, Normal affect [place].AT,PERRAL Supple Neck,No JVD, No cervical lymphadenopathy appriciated.  Symmetrical Chest wall  movement, Good air movement bilaterally, CTAB RRR,No Gallops,Rubs or new Murmurs, No Parasternal Heave +ve B.Sounds, Abd Soft, Non tender, No organomegaly appriciated, No rebound -guarding or rigidity. No Cyanosis, Clubbing or edema, No new Rash or bruise   Data Review   CBC w Diff:  Lab Results  Component Value Date   WBC 7.8 09/26/2017   HGB 11.8 (L) 09/26/2017   HCT 36.9 09/26/2017   PLT 231 09/26/2017   LYMPHOPCT 23 08/14/2017   MONOPCT 6 08/14/2017   EOSPCT 0 08/14/2017   BASOPCT 0 08/14/2017    CMP:  Lab Results  Component Value Date   NA 142 09/28/2017   K 3.5 09/28/2017   CL 108 09/28/2017   CO2 25 09/28/2017   BUN 6 09/28/2017   CREATININE 0.79 09/28/2017   CREATININE 1.34 (H) 06/16/2013   PROT 7.9 09/22/2017   ALBUMIN 4.0 09/22/2017   BILITOT 1.6 (H) 09/22/2017   ALKPHOS 67 09/22/2017   AST 30 09/22/2017   ALT 62 (H) 09/22/2017  .   Total Time in preparing paper work, data evaluation and todays exam - 72 minutes  Lala Lund M.D on 09/28/2017 at 12:01 PM  [facility]   Office  [phone]

## 2017-09-28 NOTE — Discharge Instructions (Signed)
Follow with Primary MD Binnie Rail, MD in 7 days   Get CBC, CMP,   checked  by Primary MD in 5-7 days    Activity: As tolerated with Full fall precautions use walker/cane & assistance as needed  Disposition Home   Diet:   Bariatric diet.  For Heart failure patients - Check your Weight same time everyday, if you gain over 2 pounds, or you develop in leg swelling, experience more shortness of breath or chest pain, call your Primary MD immediately. Follow Cardiac Low Salt Diet and 1.5 lit/day fluid restriction.  Special Instructions: If you have smoked or chewed Tobacco  in the last 2 yrs please stop smoking, stop any regular Alcohol  and or any Recreational drug use.  On your next visit with your primary care physician please Get Medicines reviewed and adjusted.  Please request your Prim.MD to go over all Hospital Tests and Procedure/Radiological results at the follow up, please get all Hospital records sent to your Prim MD by signing hospital release before you go home.  If you experience worsening of your admission symptoms, develop shortness of breath, life threatening emergency, suicidal or homicidal thoughts you must seek medical attention immediately by calling 911 or calling your MD immediately  if symptoms less severe.  You Must read complete instructions/literature along with all the possible adverse reactions/side effects for all the Medicines you take and that have been prescribed to you. Take any new Medicines after you have completely understood and accpet all the possible adverse reactions/side effects.

## 2017-10-01 ENCOUNTER — Inpatient Hospital Stay: Payer: BC Managed Care – PPO | Admitting: Internal Medicine

## 2017-10-08 ENCOUNTER — Encounter: Payer: BC Managed Care – PPO | Attending: General Surgery | Admitting: Registered"

## 2017-10-08 ENCOUNTER — Encounter: Payer: Self-pay | Admitting: Registered"

## 2017-10-08 DIAGNOSIS — Z9884 Bariatric surgery status: Secondary | ICD-10-CM | POA: Insufficient documentation

## 2017-10-08 DIAGNOSIS — E119 Type 2 diabetes mellitus without complications: Secondary | ICD-10-CM | POA: Diagnosis not present

## 2017-10-08 DIAGNOSIS — E669 Obesity, unspecified: Secondary | ICD-10-CM | POA: Diagnosis not present

## 2017-10-08 DIAGNOSIS — Z6836 Body mass index (BMI) 36.0-36.9, adult: Secondary | ICD-10-CM | POA: Diagnosis not present

## 2017-10-08 DIAGNOSIS — Z713 Dietary counseling and surveillance: Secondary | ICD-10-CM | POA: Insufficient documentation

## 2017-10-08 DIAGNOSIS — I10 Essential (primary) hypertension: Secondary | ICD-10-CM | POA: Insufficient documentation

## 2017-10-08 NOTE — Patient Instructions (Addendum)
  Test(s) ordered today. Your results will be released to [place] (or called to you) after review, usually within 72hours after test completion. If any changes need to be made, you will be notified at that same time.   Medications reviewed and updated.  Changes include restarting plavix and changing pravastatin to generic crestor.    Your prescription(s) have been submitted to your pharmacy. Please take as directed and contact our office if you believe you are having problem(s) with the medication(s).    Please followup as already scheduled.

## 2017-10-08 NOTE — Progress Notes (Signed)
Subjective:    Patient ID: Rebekah Kennedy, female    DOB: 1956-04-27, 62 y.o.   MRN: [id number]  HPI The patient is here for follow up from the hospital.   She had gastric bypass on 08/12/17.   Admitted 09/22/17 - 09/28/17 for intractable nausea and vomiting.  It was unclear what the cause was but was thought to be multifactorial - obstipation/constipation, having had no BM in 6-7 days, UTI, recent gastric surgery.  She was treated with bowel rest, IV reglan and had a bowel movement 2 days ago.  All of her symptoms resolved.  She was discharged home on reglan prior to meals for 5 days, miralax and colace daily as well as zofran as needed.    Hospital advised BMP, CBC and CXR after 1 week.  ? Need for chest xray  Nausea, vomiting, constipation, s/p gastric sleeve surgery: General surgery followed patient Constipation treated and resolved  N/ V resolved She can feel nausea with eating and has taken the reglan as needed  She is working with the nutritionist to help get her protein and calories in She is taking miralax as needed, she takes MOM She is not taking the Doculax - it is uncomfortable to do that She is having a BM every few days which is normal for her  UTI: enterococcus Treated with amoxicillin She denies urinary symptoms and abdominal pain, but never had any She denies fevers, chills  AKI: Likely hemodynamically mediated Resolved  Diabetes, type 2: Stable Continued on home medications - januvia Sugars have been well controlled at home - 91-118  Hypertension: Stable, controlled in hospital Continued on losartan, metoprolol, amlodipine, spironolactone BP elevated here today - took all her meds today, but last couple of days did not take all of them monitor bp at home  Bronchial asthma: Stable Inhaler prn     Medications and allergies reviewed with patient and updated if appropriate.  Patient Active Problem List   Diagnosis Date Noted  . Nausea and  vomiting 09/24/2017  . Diabetes mellitus type 2 in obese ([place]) 09/23/2017  . AKI (acute kidney injury) ([place])   . Nausea & vomiting 09/22/2017  . 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 Constipation 08/19/2017  . Hypertensive urgency 05/23/2017  . Greater trochanteric bursitis of both hips 12/19/2016  . OSA (obstructive sleep apnea) 07/26/2016  . Hypersomnia 06/11/2016  . Diabetes ([place]) 07/19/2015  . History of colonic polyps 12/01/2014  . Morbidly obese ([place]) 10/05/2013  . Partial nontraumatic tear of right rotator cuff 09/21/2013  . Goiter 12/10/2011  . Asthma 12/10/2011  . 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 TIA (transient ischemic attack) 02/13/2011  . PROTEINURIA, MILD 09/15/2009  . Hyperlipidemia 12/01/2006  . Essential hypertension 07/07/2006    Current Outpatient Medications on File Prior to Visit  Medication Sig Dispense Refill  . albuterol (PROVENTIL HFA;VENTOLIN HFA) 108 (90 Base) MCG/ACT inhaler Inhale 2 puffs into the lungs every 4 (four) hours as needed for wheezing or shortness of breath. 18 g 2  . amLODipine (NORVASC) 10 MG tablet Take 0.5 tablets (5 mg total) by mouth daily. 90 tablet 1  . Biotin 5000 MCG TABS Take 5,000 mcg by mouth daily.     . bisacodyl (DULCOLAX) 5 MG EC tablet Take 2 tablets (10 mg total) by mouth daily as needed for moderate constipation. 10 tablet 0  . Calcium Carb-Ergocalciferol (CHEWABLE CALCIUM/D PO) Take 1 tablet by mouth 3 (three) times daily.    Marland Kitchen  Carboxymethylcellul-Glycerin (LUBRICATING EYE DROPS OP) Place 1 drop into both eyes daily as needed (dry eyes).    Marland Kitchen losartan (COZAAR) 100 MG tablet TAKE 1 TAB BY MOUTH ONCE DAILY. 90 tablet 1  . metoprolol succinate (TOPROL-XL) 100 MG 24 hr tablet TAKE 1 TABLET BY MOUTH DAILY WITH OR IMMEDIATELY FOLLOWING A MEAL 90 tablet 1  . montelukast (SINGULAIR) 10 MG tablet Take 1 tablet (10 mg total) by mouth at bedtime. (Patient taking differently: Take 10 mg by mouth daily. ) 90 tablet 3  . Multiple  Vitamin (MULTIVITAMIN) tablet Take 1 tablet by mouth 2 (two) times daily. Barimets    . ondansetron (ZOFRAN) 4 MG tablet Take 1 tablet (4 mg total) by mouth every 8 (eight) hours as needed for nausea. 20 tablet 0  . pantoprazole (PROTONIX) 40 MG tablet Take 40 mg by mouth daily.  2  . polyethylene glycol (MIRALAX / GLYCOLAX) packet Take 17 g by mouth daily. 14 each 0  . pravastatin (PRAVACHOL) 20 MG tablet Take 1 tablet (20 mg total) by mouth daily. (Patient taking differently: Take 20 mg by mouth at bedtime. ) 90 tablet 3  . sitaGLIPtin (JANUVIA) 100 MG tablet Take 1 tablet (100 mg total) by mouth daily. 30 tablet 11  . spironolactone (ALDACTONE) 25 MG tablet Take 1 tablet (25 mg total) by mouth daily. 90 tablet 1  . enoxaparin (LOVENOX) 40 MG/0.4ML injection Inject 0.4 mLs (40 mg total) into the skin daily for 30 doses. 30 Syringe 0  . metoCLOPramide (REGLAN) 5 MG tablet Take 1 tablet (5 mg total) by mouth 3 (three) times daily before meals for 5 days. 15 tablet 0   No current facility-administered medications on file prior to visit.     Past Medical History:  Diagnosis Date  . ASTHMA NOS W/ACUTE EXACERBATION 07/10/2010  . Cervical dysplasia   . DEPRESSION 03/16/2007  . DIABETES MELLITUS, TYPE II 12/01/2006  . Dysmenorrhea   . Fibroid   . HYPERLIPIDEMIA 12/01/2006  . HYPERTENSION 07/07/2006  . Obesity   . Osteopenia 05/2017   T score -1.5 FRAX 2.6% / 0.1%  . Retinal edema    gets steriod inj in the eyes    . Sleep apnea    HAS CPAP BUT ADMITS DOES NOT USE JUDICIALLY   . Splenomegaly    in college  . Stroke [facility]) 2012   R pontine, mild residual left hemiparesis  . TIA (transient ischemic attack) 2012   2 TIAs 1 week apart of each other     Past Surgical History:  Procedure Laterality Date  . 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 APPENDECTOMY    . BIOPSY THYROID  05/02/11   Nonneoplastic goiter  . BREAST BIOPSY    . BREAST LUMPECTOMY WITH RADIOACTIVE SEED LOCALIZATION Left  07/25/2017   Procedure: LEFT BREAST LUMPECTOMY WITH RADIOACTIVE SEED LOCALIZATION;  Surgeon: Coralie Keens, MD;  Location: [facility];  Service: General;  Laterality: Left;  . BREAST SURGERY     Reduction  . COLPOSCOPY    . DILATION AND CURETTAGE OF UTERUS  1975   DUB  . EYE SURGERY     Laser  . GASTRIC ROUX-EN-Y N/A 08/12/2017   Procedure: LAPAROSCOPIC ROUX-EN-Y GASTRIC BYPASS WITH HIATAL HERNIA REPAIR AND UPPER ENDOSCOPY;  Surgeon: [name], [name], MD;  Location: [facility];  Service: General;  Laterality: N/A;  . GYNECOLOGIC CRYOSURGERY    . MYOMECTOMY    . OVARIAN CYST REMOVAL    . PELVIC LAPAROSCOPY  75,88   DL lysis of adhesions    Social History   Socioeconomic History  . Marital status: Single    Spouse name: Not on file  . Number of children: 2  . Years of education: Not on file  . Highest education level: Not on file  Occupational History  . Occupation: Product manager: [employer]  . Financial resource strain: Not on file  . Food insecurity:    Worry: Not on file    Inability: Not on file  . Transportation needs:    Medical: Not on file    Non-medical: Not on file  Tobacco Use  . Smoking status: Never Smoker  . Smokeless tobacco: Never Used  Substance and Sexual Activity  . Alcohol use: Yes    Alcohol/week: 0.0 oz    Comment: very rare  . Drug use: No  . Sexual activity: Not Currently    Birth control/protection: Post-menopausal    Comment: 1st intercourse 62 yo-Fewer than 5 partners  Lifestyle  . Physical activity:    Days per week: Not on file    Minutes per session: Not on file  . Stress: Not on file  Relationships  . Social connections:    Talks on phone: Not on file    Gets together: Not on file    Attends religious service: Not on file    Active member of club or organization: Not on file    Attends meetings of clubs or organizations: Not on file    Relationship status: Not on file  Other Topics Concern  . 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 Cancer Maternal Grandmother        Colon Cancer  . Asthma Maternal Grandmother   . Diabetes Father   . Heart disease Father   . Hypertension Father   . Hyperlipidemia Father   . Hypertension Mother   . Heart disease Mother   . Ovarian cancer Mother   . Cancer Mother        Lung cancer  . Asthma Mother   . COPD Mother   . Hyperlipidemia Mother   . Diabetes Brother   . Hypertension Brother   . Kidney disease Brother   . Asthma Brother   . Heart disease Brother   . Hyperlipidemia Brother   . Cancer Brother        Prostate  . Graves' disease Sister   . Diabetes Sister   . Breast cancer Sister 61  . Graves' disease Paternal Grandmother   . Hypertension Paternal Grandmother   . Heart disease Paternal Grandmother   . Alzheimer's disease Paternal Grandmother   . Cancer Maternal Grandfather   . Heart failure Brother   . Heart failure Brother     Review of Systems  Constitutional: Negative for chills and fever.  Respiratory: Negative for cough, shortness of breath and wheezing.   Cardiovascular: Negative for chest pain, palpitations and leg swelling.  Gastrointestinal: Positive for constipation and nausea. Negative for abdominal pain.  Genitourinary: Negative for dysuria, frequency and hematuria.  Neurological: Negative for light-headedness and headaches.       Objective:   Vitals:   10/09/17 1315  BP: (!) 158/96  Pulse: 89  Resp: 16  Temp: 97.9 F (36.6 C)  SpO2: 98%   BP Readings from Last 3 Encounters:  10/09/17 (!) 158/96  09/28/17 (!) 165/80  08/29/17 (!) 162/88   Wt Readings from Last 3 Encounters:  10/09/17 204 lb (92.5 kg)  10/08/17 203 lb 9.6 oz (92.4 kg)  09/27/17 210 lb 14.4 oz (95.7 kg)   Body mass index is 32.93 kg/m.   Physical Exam    Constitutional: Appears  well-developed and well-nourished. No distress.  HENT:  Head: Normocephalic and atraumatic.  Neck: Neck supple. No tracheal deviation present. No thyromegaly present.  No cervical lymphadenopathy Cardiovascular: Normal rate, regular rhythm and normal heart sounds.   No murmur heard. No carotid bruit .  No edema Pulmonary/Chest: Effort normal and breath sounds normal. No respiratory distress. No has no wheezes. No rales.  Skin: Skin is warm and dry. Not diaphoretic.  Psychiatric: Normal mood and affect. Behavior is normal.    CT ABDOMEN PELVIS W CONTRAST CLINICAL DATA:  Nausea, vomiting, history of gastric bypass  EXAM: CT ABDOMEN AND PELVIS WITH CONTRAST  TECHNIQUE: Multidetector CT imaging of the abdomen and pelvis was performed using the standard protocol following bolus administration of intravenous contrast.  CONTRAST:  152mL OMNIPAQUE IOHEXOL 300 MG/ML  SOLN  COMPARISON:  Abdomen films of 09/22/2017 and CT abdomen pelvis of 09/15/2015  FINDINGS: Lower chest: Lung bases are clear. The heart is mildly enlarged. No pericardial effusion is seen.  Hepatobiliary: The liver enhances with no focal abnormality and no ductal dilatation is seen. No definite calcified gallstones are noted within the gallbladder.  Pancreas: The pancreas is normal in size and the pancreatic duct is not dilated.  Spleen: The spleen is unremarkable.  Adrenals/Urinary Tract: The adrenal glands appear normal. Kidneys enhance and there is no evidence of renal calculi or hydronephrosis. No renal mass is seen. The ureters appear normal in caliber. The urinary bladder is slightly thick-walled but no intraluminal bladder lesion is seen.  Stomach/Bowel: Changes of prior gastrojejunostomy are noted. The anastomotic site does not appear to be edematous and no dilatation of bowel is seen. Surgical sutures in the left abdomen represent the distal anastomotic site with no abnormality noted by CT in  that region. Small bowel is normal in caliber. Scattered colonic diverticuli are present. However the colon is relatively decompressed and difficult to evaluate. No obvious colonic abnormality is seen. The terminal ileum is unremarkable, and by history the appendix has previously been resected.  Vascular/Lymphatic: The abdominal aorta is normal in caliber with moderate abdominal aortic atherosclerosis present. No adenopathy is seen.  Reproductive: The uterus is normal in size. Small ovarian cysts are most likely present with low-attenuation and both adnexa but no free fluid is noted within the pelvis. No enlarging adnexal lesion is seen when compared to the prior CT.  Other: Areas of higher density within the subcutaneous fat and the right abdomen may represent injection sites. No abdominal wall hernia is noted.  Musculoskeletal: The lumbar vertebrae are in normal alignment with normal intervertebral disc spaces. There are degenerative changes involving the facet joints of L3-4, L4-5, and L5-S1. The SI joints are corticated.  IMPRESSION: 1. No explanation for the patient's symptoms is seen. No obvious abnormality of the gastric bypass is evident. 2. No renal or ureteral calculi are seen. 3. The urinary bladder is slightly thick walled, of questionable significance. Is there any clinical suspicion of cystitis? 4. Moderate abdominal aortic atherosclerosis.  Electronically Signed   By: Ivar Drape M.D.   On: 09/24/2017 15:38  Lab Results  Component Value Date   WBC 7.8 09/26/2017   HGB 11.8 (L) 09/26/2017   HCT 36.9 09/26/2017   PLT 231 09/26/2017   GLUCOSE 107 (H) 09/28/2017   CHOL 215 (H) 07/18/2016   TRIG 151.0 (H) 07/18/2016   HDL 57.90 07/18/2016   LDLDIRECT 126.7 03/24/2013   LDLCALC 127 (H) 07/18/2016   ALT 62 (H) 09/22/2017   AST 30 09/22/2017   NA 142 09/28/2017   K 3.5 09/28/2017   CL 108 09/28/2017   CREATININE 0.79 09/28/2017   BUN 6 09/28/2017   CO2  25 09/28/2017   TSH 1.471 05/24/2017   INR 0.98 07/22/2017   HGBA1C 8.6 (H) 08/08/2017   MICROALBUR 14.5 (H) 09/16/2013     Assessment & Plan:    See Problem List for Assessment and Plan of chronic medical problems.

## 2017-10-08 NOTE — Progress Notes (Signed)
Follow-up visit: 8 Weeks Post-Operative RYGB Surgery  Medical Nutrition Therapy:  Appt start time: 3:15 end time:  4:15.  Primary concerns today: Post-operative Bariatric Surgery Nutrition Management.  Non scale victories: feels better overall  Surgery date: 08/12/2017 Surgery type: RYGB [facility] weight at [facility]: 251 lbs Weight today: 203.6 lbs Weight change: 21.6 lbs from 225.2 (08/27/2017) Total weight lost: 47.4 lbs Weight loss goal: eliminate diabetes, preserve vision   TANITA  BODY COMP RESULTS  08/27/2017 10/08/2017   BMI (kg/m^2) Pt declined 32.9   Fat Mass (lbs)  86.8   Fat Free Mass (lbs)  116.8   Total Body Water (lbs)  83.6   Pt states she was in the hospital 5/5-5/11 due to resistant UTI. Pt states around 5/1 she ate some pork and was unable to keep food down, went to hospital on 5/5. Pt states she was discharged 5/11 and has not been able to keep food down and been drinking protein shakes. Pt states she eats fruit snack at the end of her work day when she feels lightheaded. Pt then goes on to say she eats a fruit snack around 12pm and then again before bed sometimes. Pt states she checks BS periodically: FBS (98-120). Pt states she feels tired sometimes without having a reason to be tired. Pt states she knows her BS is not too low. Pt states she knows that meat is not going to work for her. Pt states she is trying to stay hydrated sips throughout her day. Pt states she tracks her food by writing it down with protein content. Pt is having trouble recalling diet history; challenging for me to follow along.   Preferred Learning Style:   No preference indicated   Learning Readiness:   Ready  Change in progress  24-hr recall: poor historian B (AM): 1/2 shake (15g) Snk (AM): none  L (PM): 1 oz beef (7g) Snk (PM): cheese stick (6g)  D (PM): 1/2 shake (15g), protein water (20g) Snk (PM): none  Fluid intake: water (40 oz), protein water, protein shakes; 64+  ounces Estimated total protein intake: 63 grams  Medications: See list  Supplementation: 2 Opurity + 3 calcium supplements  CBG monitoring: sometimes Average CBG per patient: FBS (98-120) Last patient reported A1c: none stated  Using straws: no Drinking while eating: no Having you been chewing well: yes Chewing/swallowing difficulties: no Changes in vision: pt states she has retinopathy Changes to mood/headaches: no Hair loss/Changes to skin/Changes to nails: no, better, no Any difficulty focusing or concentrating: no Sweating: no Dizziness/Lightheaded: no Palpitations: no  Carbonated beverages: no N/V/D/C/GAS: sometimes, no, no, no, no Abdominal Pain: no Dumping syndrome: no Last Lap-Band fill: N/A  Recent physical activity:  Some walking  Progress Towards Goal(s):  In progress.  Handouts given during visit include:  Phase IV: High Protein + NS vegetables   Nutritional Diagnosis:  [place]-3.3 Overweight/obesity related to past poor dietary habits and physical inactivity as evidenced by patient w/ recent RYGB surgery following dietary guidelines for continued weight loss.     Intervention:  Nutrition education and counseling. Pt was educated and counseled on the importance of keeping a record of protein and fluid intake, chewing well, not drinking around eating, taking bariatric specific multivitamins, checking blood sugar more consistently especially when feeling tired.  Goals:  Follow Phase 3B: High Protein + Non-Starchy Vegetables  Eat 3-6 small meals/snacks, every 3-5 hrs  Increase lean protein foods to meet 60g goal  Increase fluid intake to 64oz +  Avoid drinking 15 minutes before, during and 30 minutes after eating  Aim for >30 min of physical activity daily  Teaching Method Utilized:  Visual Auditory Hands on  Barriers to learning/adherence to lifestyle change: contemplative stage of change  Demonstrated degree of understanding via:  Teach Back    Monitoring/Evaluation:  Dietary intake, exercise, lap band fills, and body weight. Follow up in 4 months for 6 month post-op visit.

## 2017-10-08 NOTE — Patient Instructions (Addendum)
Goals:  Follow Phase 3B: High Protein + Non-Starchy Vegetables  Eat 3-6 small meals/snacks, every 3-5 hrs  Increase lean protein foods to meet 60g goal  Increase fluid intake to 64oz +  Avoid drinking 15 minutes before, during and 30 minutes after eating  Aim for >30 min of physical activity daily  

## 2017-10-09 ENCOUNTER — Ambulatory Visit: Payer: BC Managed Care – PPO | Admitting: Internal Medicine

## 2017-10-09 ENCOUNTER — Encounter: Payer: Self-pay | Admitting: Internal Medicine

## 2017-10-09 ENCOUNTER — Other Ambulatory Visit (INDEPENDENT_AMBULATORY_CARE_PROVIDER_SITE_OTHER): Payer: BC Managed Care – PPO

## 2017-10-09 VITALS — BP 158/96 | HR 89 | Temp 97.9°F | Resp 16 | Wt 204.0 lb

## 2017-10-09 DIAGNOSIS — I700 Atherosclerosis of aorta: Secondary | ICD-10-CM | POA: Diagnosis not present

## 2017-10-09 DIAGNOSIS — I10 Essential (primary) hypertension: Secondary | ICD-10-CM | POA: Diagnosis not present

## 2017-10-09 DIAGNOSIS — K5900 Constipation, unspecified: Secondary | ICD-10-CM

## 2017-10-09 DIAGNOSIS — R112 Nausea with vomiting, unspecified: Secondary | ICD-10-CM

## 2017-10-09 DIAGNOSIS — N179 Acute kidney failure, unspecified: Secondary | ICD-10-CM | POA: Diagnosis not present

## 2017-10-09 DIAGNOSIS — E7849 Other hyperlipidemia: Secondary | ICD-10-CM | POA: Diagnosis not present

## 2017-10-09 DIAGNOSIS — Z794 Long term (current) use of insulin: Secondary | ICD-10-CM | POA: Diagnosis not present

## 2017-10-09 DIAGNOSIS — E1151 Type 2 diabetes mellitus with diabetic peripheral angiopathy without gangrene: Secondary | ICD-10-CM

## 2017-10-09 LAB — CBC WITH DIFFERENTIAL/PLATELET
BASOS PCT: 1.1 % (ref 0.0–3.0)
Basophils Absolute: 0.1 10*3/uL (ref 0.0–0.1)
EOS ABS: 0.3 10*3/uL (ref 0.0–0.7)
Eosinophils Relative: 3.6 % (ref 0.0–5.0)
HCT: 37.9 % (ref 36.0–46.0)
HEMOGLOBIN: 12.5 g/dL (ref 12.0–15.0)
LYMPHS ABS: 2.1 10*3/uL (ref 0.7–4.0)
Lymphocytes Relative: 26.5 % (ref 12.0–46.0)
MCHC: 33.1 g/dL (ref 30.0–36.0)
MCV: 83 fl (ref 78.0–100.0)
MONO ABS: 0.6 10*3/uL (ref 0.1–1.0)
Monocytes Relative: 8.2 % (ref 3.0–12.0)
Neutro Abs: 4.8 10*3/uL (ref 1.4–7.7)
Neutrophils Relative %: 60.6 % (ref 43.0–77.0)
Platelets: 276 10*3/uL (ref 150.0–400.0)
RBC: 4.56 Mil/uL (ref 3.87–5.11)
RDW: 16.3 % — AB (ref 11.5–15.5)
WBC: 7.9 10*3/uL (ref 4.0–10.5)

## 2017-10-09 LAB — BASIC METABOLIC PANEL
BUN: 14 mg/dL (ref 6–23)
CALCIUM: 9.1 mg/dL (ref 8.4–10.5)
CO2: 32 mEq/L (ref 19–32)
Chloride: 105 mEq/L (ref 96–112)
Creatinine, Ser: 0.97 mg/dL (ref 0.40–1.20)
GFR: 75.04 mL/min (ref >60.00)
GLUCOSE: 134 mg/dL — AB (ref 70–99)
Potassium: 3.5 mEq/L (ref 3.5–5.1)
SODIUM: 144 meq/L (ref 135–145)

## 2017-10-09 MED ORDER — AMLODIPINE BESYLATE 5 MG PO TABS: 5.0000 mg | ORAL_TABLET | Freq: Every day | ORAL | 3 refills | Status: DC

## 2017-10-09 MED ORDER — ROSUVASTATIN CALCIUM 10 MG PO TABS: 10.0000 mg | ORAL_TABLET | Freq: Every day | ORAL | 3 refills | Status: DC

## 2017-10-09 MED ORDER — CLOPIDOGREL BISULFATE 75 MG PO TABS: 75.0000 mg | ORAL_TABLET | Freq: Every day | ORAL | 3 refills | Status: DC

## 2017-10-09 NOTE — Assessment & Plan Note (Signed)
Chronic Back to baseline - fairly controlled with MOM and miralax

## 2017-10-09 NOTE — Assessment & Plan Note (Signed)
BP elevated here today Monitor at home  Continue current medications Hopefully we will be able to d/c some meds after continued weight loss bmp

## 2017-10-09 NOTE — Assessment & Plan Note (Signed)
S/p gastric sleeve Losing weight - has lost about 50 lbs

## 2017-10-09 NOTE — Assessment & Plan Note (Addendum)
Cholesterol above goal  H/o TIA, aortic atheroscloerosis D/c pravastatin Start crestor 10 mg daily monitor for aches, cramps

## 2017-10-09 NOTE — Assessment & Plan Note (Signed)
Sugars well controlled at home On januvia  Management per Dr Loanne Drilling

## 2017-10-09 NOTE — Assessment & Plan Note (Signed)
Resolved Bmp today

## 2017-10-09 NOTE — Assessment & Plan Note (Signed)
Still with some nausea after eating - working with nutitionist zofran prn No vomiting Bmp today

## 2017-10-09 NOTE — Assessment & Plan Note (Signed)
Seen on ct scan Change pravastatin to crestor 10 mg

## 2017-10-10 ENCOUNTER — Encounter: Payer: Self-pay | Admitting: Internal Medicine

## 2017-11-04 ENCOUNTER — Ambulatory Visit: Payer: BC Managed Care – PPO | Admitting: Internal Medicine

## 2017-11-15 ENCOUNTER — Encounter: Payer: Self-pay | Admitting: Endocrinology

## 2017-11-15 ENCOUNTER — Ambulatory Visit: Payer: BC Managed Care – PPO | Admitting: Endocrinology

## 2017-11-15 VITALS — BP 146/80 | HR 75 | Ht 66.0 in | Wt 192.2 lb

## 2017-11-15 DIAGNOSIS — E1151 Type 2 diabetes mellitus with diabetic peripheral angiopathy without gangrene: Secondary | ICD-10-CM

## 2017-11-15 DIAGNOSIS — Z794 Long term (current) use of insulin: Secondary | ICD-10-CM | POA: Diagnosis not present

## 2017-11-15 DIAGNOSIS — Z9889 Other specified postprocedural states: Secondary | ICD-10-CM | POA: Diagnosis not present

## 2017-11-15 LAB — CBC WITH DIFFERENTIAL/PLATELET
BASOS ABS: 0.1 10*3/uL (ref 0.0–0.1)
Basophils Relative: 1.1 % (ref 0.0–3.0)
Eosinophils Absolute: 1 10*3/uL — ABNORMAL HIGH (ref 0.0–0.7)
Eosinophils Relative: 9.8 % — ABNORMAL HIGH (ref 0.0–5.0)
HCT: 33.4 % — ABNORMAL LOW (ref 36.0–46.0)
Hemoglobin: 11.1 g/dL — ABNORMAL LOW (ref 12.0–15.0)
LYMPHS ABS: 2.5 10*3/uL (ref 0.7–4.0)
Lymphocytes Relative: 25.7 % (ref 12.0–46.0)
MCHC: 33.3 g/dL (ref 30.0–36.0)
MCV: 83.8 fl (ref 78.0–100.0)
MONOS PCT: 7.6 % (ref 3.0–12.0)
Monocytes Absolute: 0.7 10*3/uL (ref 0.1–1.0)
NEUTROS ABS: 5.5 10*3/uL (ref 1.4–7.7)
NEUTROS PCT: 55.8 % (ref 43.0–77.0)
PLATELETS: 291 10*3/uL (ref 150.0–400.0)
RBC: 3.98 Mil/uL (ref 3.87–5.11)
RDW: 15.3 % (ref 11.5–15.5)
WBC: 9.8 10*3/uL (ref 4.0–10.5)

## 2017-11-15 LAB — VITAMIN B12: Vitamin B-12: 881 pg/mL (ref 211–911)

## 2017-11-15 LAB — VITAMIN D 25 HYDROXY (VIT D DEFICIENCY, FRACTURES): VITD: 23.66 ng/mL — ABNORMAL LOW (ref 30.00–100.00)

## 2017-11-15 LAB — BASIC METABOLIC PANEL
BUN: 14 mg/dL (ref 6–23)
CHLORIDE: 107 meq/L (ref 96–112)
CO2: 29 meq/L (ref 19–32)
CREATININE: 0.94 mg/dL (ref 0.40–1.20)
Calcium: 8.8 mg/dL (ref 8.4–10.5)
GFR: 77.79 mL/min (ref >60.00)
Glucose, Bld: 155 mg/dL — ABNORMAL HIGH (ref 70–99)
POTASSIUM: 3 meq/L — AB (ref 3.5–5.1)
Sodium: 146 mEq/L — ABNORMAL HIGH (ref 135–145)

## 2017-11-15 LAB — HEPATIC FUNCTION PANEL
ALBUMIN: 3.8 g/dL (ref 3.5–5.2)
ALK PHOS: 48 U/L (ref 39–117)
ALT: 14 U/L (ref 0–35)
AST: 14 U/L (ref 0–37)
Bilirubin, Direct: 0.1 mg/dL (ref 0.0–0.3)
Total Bilirubin: 0.8 mg/dL (ref 0.2–1.2)
Total Protein: 6.4 g/dL (ref 6.0–8.3)

## 2017-11-15 LAB — POCT GLYCOSYLATED HEMOGLOBIN (HGB A1C): Hemoglobin A1C: 6 % — AB (ref 4.0–5.6)

## 2017-11-15 LAB — IBC PANEL
Iron: 77 ug/dL (ref 42–145)
SATURATION RATIOS: 26.7 % (ref 20.0–50.0)
TRANSFERRIN: 206 mg/dL — AB (ref 212.0–360.0)

## 2017-11-15 LAB — FERRITIN: Ferritin: 424.6 ng/mL — ABNORMAL HIGH (ref 10.0–291.0)

## 2017-11-15 NOTE — Patient Instructions (Addendum)
Your blood pressure is high today.  Please see your primary care provider soon, to have it rechecked.  Please continue the same [place] check your blood sugar twice a day.  vary the time of day when you check, between before the 3 meals, and at bedtime.  also check if you have symptoms of your blood sugar being too high or too low.  please keep a record of the readings and bring it to your next appointment here (or you can bring the meter itself).  You can write it on any piece of paper.  please call us sooner if your blood sugar goes below 70, or if you have a lot of readings over 200. blood tests are requested for you today.  We'll let you know about the results. Please come back for a follow-up appointment in 3 months.

## 2017-11-15 NOTE — Progress Notes (Signed)
Subjective:    Patient ID: Rebekah Kennedy, female    DOB: January 16, 1956, 62 y.o.   MRN: [id number]  HPI Pt returns for f/u of diabetes mellitus:  DM type: Insulin-requiring type 2.  Dx'ed: 2004.  Complications: CVA, nephropathy, polyneuropathy, and PDR.  Therapy: insulin since 2013.   GDM: never.  DKA: never Severe hypoglycemia: once, in 2018.  Pancreatitis: never.  Other: she took insulin 2013-2019, when she had gastric bypass surgery.   Interval history:  She has lost 58 lbs so far.  pt states she feels well in general.   Past Medical History:  Diagnosis Date  . ASTHMA NOS W/ACUTE EXACERBATION 07/10/2010  . Cervical dysplasia   . DEPRESSION 03/16/2007  . DIABETES MELLITUS, TYPE II 12/01/2006  . Dysmenorrhea   . Fibroid   . HYPERLIPIDEMIA 12/01/2006  . HYPERTENSION 07/07/2006  . Obesity   . Osteopenia 05/2017   T score -1.5 FRAX 2.6% / 0.1%  . Retinal edema    gets steriod inj in the eyes    . Sleep apnea    HAS CPAP BUT ADMITS DOES NOT USE JUDICIALLY   . Splenomegaly    in college  . 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 TIA (transient ischemic attack) 2012   2 TIAs 1 week apart of each other     Past Surgical History:  Procedure Laterality Date  . 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 APPENDECTOMY    . BIOPSY THYROID  05/02/11   Nonneoplastic goiter  . BREAST BIOPSY    . BREAST LUMPECTOMY WITH RADIOACTIVE SEED LOCALIZATION Left 07/25/2017   Procedure: LEFT BREAST LUMPECTOMY WITH RADIOACTIVE SEED LOCALIZATION;  Surgeon: Coralie Keens, MD;  Location: [place];  Service: General;  Laterality: Left;  . BREAST SURGERY     Reduction  . COLPOSCOPY    . DILATION AND CURETTAGE OF UTERUS  1975   DUB  . EYE SURGERY     Laser  . GASTRIC ROUX-EN-Y N/A 08/12/2017   Procedure: LAPAROSCOPIC ROUX-EN-Y GASTRIC BYPASS WITH HIATAL HERNIA REPAIR AND UPPER ENDOSCOPY;  Surgeon: [name], [name], MD;  Location[facility];  Service: General;  Laterality: N/A;    . GYNECOLOGIC CRYOSURGERY    . MYOMECTOMY    . OVARIAN CYST REMOVAL    . PELVIC LAPAROSCOPY  75,88   DL lysis of adhesions    Social History   Socioeconomic History  . Marital status: Single    Spouse name: Not on file  . Number of children: 2  . Years of education: Not on file  . Highest education level: Not on file  Occupational History  . Occupation: Product manager: [employer]  . Financial resource strain: Not on file  . Food insecurity:    Worry: Not on file    Inability: Not on file  . Transportation needs:    Medical: Not on file    Non-medical: Not on file  Tobacco Use  . Smoking status: Never Smoker  . Smokeless tobacco: Never Used  Substance and Sexual Activity  . Alcohol use: Yes    Alcohol/week: 0.0 oz    Comment: very rare  . Drug use: No  . Sexual activity: Not Currently    Birth control/protection: Post-menopausal    Comment: 1st intercourse 62 yo-Fewer than 5 partners  Lifestyle  . Physical activity:    Days per week: Not on file    Minutes per  session: Not on file  . Stress: Not on file  Relationships  . Social connections:    Talks on phone: Not on file    Gets together: Not on file    Attends religious service: Not on file    Active member of club or organization: Not on file    Attends meetings of clubs or organizations: Not on file    Relationship status: Not on file  . Intimate partner violence:    Fear of current or ex partner: Not on file    Emotionally abused: Not on file    Physically abused: Not on file    Forced sexual activity: Not on file  Other Topics Concern  . 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 albuterol (PROVENTIL HFA;VENTOLIN HFA) 108 (90 Base) MCG/ACT inhaler Inhale 2 puffs into the lungs every 4 (four)  hours as needed for wheezing or shortness of breath. 18 g 2  . amLODipine (NORVASC) 5 MG tablet Take 1 tablet (5 mg total) by mouth daily. 90 tablet 3  . Biotin 5000 MCG TABS Take 5,000 mcg by mouth daily.     . Calcium Carb-Ergocalciferol (CHEWABLE CALCIUM/D PO) Take 1 tablet by mouth 3 (three) times daily.    . Carboxymethylcellul-Glycerin (LUBRICATING EYE DROPS OP) Place 1 drop into both eyes daily as needed (dry eyes).    . clopidogrel (PLAVIX) 75 MG tablet Take 1 tablet (75 mg total) by mouth daily. 90 tablet 3  . losartan (COZAAR) 100 MG tablet TAKE 1 TAB BY MOUTH ONCE DAILY. 90 tablet 1  . metoprolol succinate (TOPROL-XL) 100 MG 24 hr tablet TAKE 1 TABLET BY MOUTH DAILY WITH OR IMMEDIATELY FOLLOWING A MEAL 90 tablet 1  . montelukast (SINGULAIR) 10 MG tablet Take 1 tablet (10 mg total) by mouth at bedtime. (Patient taking differently: Take 10 mg by mouth daily. ) 90 tablet 3  . Multiple Vitamin (MULTIVITAMIN) tablet Take 1 tablet by mouth 2 (two) times daily. Barimets    . ondansetron (ZOFRAN) 4 MG tablet Take 1 tablet (4 mg total) by mouth every 8 (eight) hours as needed for nausea. 20 tablet 0  . pantoprazole (PROTONIX) 40 MG tablet Take 40 mg by mouth daily.  2  . polyethylene glycol (MIRALAX / GLYCOLAX) packet Take 17 g by mouth daily. 14 each 0  . rosuvastatin (CRESTOR) 10 MG tablet Take 1 tablet (10 mg total) by mouth daily. 90 tablet 3  . sitaGLIPtin (JANUVIA) 100 MG tablet Take 1 tablet (100 mg total) by mouth daily. 30 tablet 11  . spironolactone (ALDACTONE) 25 MG tablet Take 1 tablet (25 mg total) by mouth daily. 90 tablet 1   No current facility-administered medications on file prior to visit.     Allergies  Allergen Reactions  . Quinapril Cough    Family History  Problem Relation Age of Onset  . Cancer Maternal Grandmother        Colon Cancer  . Asthma Maternal Grandmother   . Diabetes Father   . Heart disease Father   . Hypertension Father   . Hyperlipidemia Father    . Hypertension Mother   . Heart disease Mother   . Ovarian cancer Mother   . Cancer Mother        Lung  cancer  . Asthma Mother   . COPD Mother   . Hyperlipidemia Mother   . Diabetes Brother   . Hypertension Brother   . Kidney disease Brother   . Asthma Brother   . Heart disease Brother   . Hyperlipidemia Brother   . Cancer Brother        Prostate  . Graves' disease Sister   . Diabetes Sister   . Breast cancer Sister 52  . Graves' disease Paternal Grandmother   . Hypertension Paternal Grandmother   . Heart disease Paternal Grandmother   . Alzheimer's disease Paternal Grandmother   . Cancer Maternal Grandfather   . Heart failure Brother   . Heart failure Brother     BP (!) 146/80 (BP Location: Right Arm, Patient Position: Sitting, Cuff Size: Normal)   Pulse 75   Ht 5\' 6"  (1.676 m)   Wt 192 lb 3.2 oz (87.2 kg)   SpO2 96%   BMI 31.02 kg/m    Review of Systems She denies n/v.  She has slight numbness of the feet    Objective:   Physical Exam VITAL SIGNS:  See vs page GENERAL: no distress Pulses: foot pulses are intact bilaterally.   MSK: no deformity of the feet or ankles.  CV: no edema of the legs or ankles Skin:  no ulcer on the feet or ankles.  normal color and temp on the feet and ankles Neuro: sensation is intact to touch on the feet and ankles.     Lab Results  Component Value Date   HGBA1C 6.0 (A) 11/15/2017       Assessment & Plan:  HTN: is noted today Type 2 DM: well-controlled Obesity: continued improvement after surgery  Patient Instructions  Your blood pressure is high today.  Please see your primary care provider soon, to have it rechecked.  Please continue the same [place] check your blood sugar twice a day.  vary the time of day when you check, between before the 3 meals, and at bedtime.  also check if you have symptoms of your blood sugar being too high or too low.  please keep a record of the readings and bring it to your next  appointment here (or you can bring the meter itself).  You can write it on any piece of paper.  please call us sooner if your blood sugar goes below 70, or if you have a lot of readings over 200. blood tests are requested for you today.  We'll let you know about the results. Please come back for a follow-up appointment in 3 months.

## 2017-11-17 LAB — VITAMIN D 1,25 DIHYDROXY
VITAMIN D 1, 25 (OH) TOTAL: 50 pg/mL (ref 18–72)
VITAMIN D3 1, 25 (OH): 50 pg/mL
Vitamin D2 1, 25 (OH)2: <8 pg/mL

## 2017-12-12 ENCOUNTER — Encounter: Payer: Self-pay | Admitting: Endocrinology

## 2017-12-16 ENCOUNTER — Ambulatory Visit: Payer: Self-pay | Admitting: Internal Medicine

## 2017-12-16 NOTE — Progress Notes (Signed)
Subjective:    Patient ID: Rebekah Kennedy, female    DOB: 06-18-1955, 62 y.o.   MRN: [id number]  HPI The patient is here for an acute visit.  About one month ago she noticed numbness in the right foot - the numbness is present on the bottom of the foot and top of the foot.  She denies pain.  She has tingling in the right foot and hand.  She has not noticed any swelling, but when her friend pushed on the foot there was an indentation and looked like it was swollen.  On her left foot her toes are getting numb.    She can not right lift (flex) her right toes, but can do that on the left side.  It affects her balance.  She has difficulty lifting her right leg and sometimes trips.  She denies back pain, leg pain or numbness/tingling in her legs.     BP at home typically in the 120's/80's at home.   Medications and allergies reviewed with patient and updated if appropriate.  Patient Active Problem List   Diagnosis Date Noted  . Numbness and tingling 12/17/2017  . Hypocalcemia 12/12/2017  . Atherosclerosis of aorta ([place]) 10/09/2017  . AKI (acute kidney injury) ([place])   . Nausea & vomiting 09/22/2017  . Bariatric surgery status 08/19/2017  . Constipation 08/19/2017  . Hypertensive urgency 05/23/2017  . Greater trochanteric bursitis of both hips 12/19/2016  . OSA (obstructive sleep apnea) 07/26/2016  . Hypersomnia 06/11/2016  . Diabetes ([place]) 07/19/2015  . History of colonic polyps 12/01/2014  . Morbidly obese ([place]) 10/05/2013  . Partial nontraumatic tear of right rotator cuff 09/21/2013  . Goiter 12/10/2011  . Asthma 12/10/2011  . 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 TIA (transient ischemic attack) 02/13/2011  . PROTEINURIA, MILD 09/15/2009  . Hyperlipidemia 12/01/2006  . Essential hypertension 07/07/2006    Current Outpatient Medications on File Prior to Visit  Medication Sig Dispense Refill  . albuterol (PROVENTIL HFA;VENTOLIN HFA) 108 (90 Base) MCG/ACT inhaler  Inhale 2 puffs into the lungs every 4 (four) hours as needed for wheezing or shortness of breath. 18 g 2  . amLODipine (NORVASC) 5 MG tablet Take 1 tablet (5 mg total) by mouth daily. 90 tablet 3  . Biotin 5000 MCG TABS Take 5,000 mcg by mouth daily.     . Calcium Carb-Ergocalciferol (CHEWABLE CALCIUM/D PO) Take 1 tablet by mouth 3 (three) times daily.    . Carboxymethylcellul-Glycerin (LUBRICATING EYE DROPS OP) Place 1 drop into both eyes daily as needed (dry eyes).    . clopidogrel (PLAVIX) 75 MG tablet Take 1 tablet (75 mg total) by mouth daily. 90 tablet 3  . losartan (COZAAR) 100 MG tablet TAKE 1 TAB BY MOUTH ONCE DAILY. 90 tablet 1  . metoprolol succinate (TOPROL-XL) 100 MG 24 hr tablet TAKE 1 TABLET BY MOUTH DAILY WITH OR IMMEDIATELY FOLLOWING A MEAL 90 tablet 1  . montelukast (SINGULAIR) 10 MG tablet Take 1 tablet (10 mg total) by mouth at bedtime. (Patient taking differently: Take 10 mg by mouth daily. ) 90 tablet 3  . Multiple Vitamin (MULTIVITAMIN) tablet Take 1 tablet by mouth 2 (two) times daily. Barimets    . ondansetron (ZOFRAN) 4 MG tablet Take 1 tablet (4 mg total) by mouth every 8 (eight) hours as needed for nausea. 20 tablet 0  . pantoprazole (PROTONIX) 40 MG tablet Take 40 mg by mouth daily.  2  . polyethylene glycol (MIRALAX /  GLYCOLAX) packet Take 17 g by mouth daily. 14 each 0  . rosuvastatin (CRESTOR) 10 MG tablet Take 1 tablet (10 mg total) by mouth daily. 90 tablet 3  . sitaGLIPtin (JANUVIA) 100 MG tablet Take 1 tablet (100 mg total) by mouth daily. 30 tablet 11  . spironolactone (ALDACTONE) 25 MG tablet Take 1 tablet (25 mg total) by mouth daily. 90 tablet 1   No current facility-administered medications on file prior to visit.     Past Medical History:  Diagnosis Date  . ASTHMA NOS W/ACUTE EXACERBATION 07/10/2010  . Cervical dysplasia   . DEPRESSION 03/16/2007  . DIABETES MELLITUS, TYPE II 12/01/2006  . Dysmenorrhea   . Fibroid   . HYPERLIPIDEMIA 12/01/2006  .  HYPERTENSION 07/07/2006  . Obesity   . Osteopenia 05/2017   T score -1.5 FRAX 2.6% / 0.1%  . Retinal edema    gets steriod inj in the eyes    . Sleep apnea    HAS CPAP BUT ADMITS DOES NOT USE JUDICIALLY   . Splenomegaly    in college  . 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 TIA (transient ischemic attack) 2012   2 TIAs 1 week apart of each other     Past Surgical History:  Procedure Laterality Date  . 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 APPENDECTOMY    . BIOPSY THYROID  05/02/11   Nonneoplastic goiter  . BREAST BIOPSY    . BREAST LUMPECTOMY WITH RADIOACTIVE SEED LOCALIZATION Left 07/25/2017   Procedure: LEFT BREAST LUMPECTOMY WITH RADIOACTIVE SEED LOCALIZATION;  Surgeon: Coralie Keens, MD;  Location: [place];  Service: General;  Laterality: Left;  . BREAST SURGERY     Reduction  . COLPOSCOPY    . DILATION AND CURETTAGE OF UTERUS  1975   DUB  . EYE SURGERY     Laser  . GASTRIC ROUX-EN-Y N/A 08/12/2017   Procedure: LAPAROSCOPIC ROUX-EN-Y GASTRIC BYPASS WITH HIATAL HERNIA REPAIR AND UPPER ENDOSCOPY;  Surgeon: [name], [name], MD;  [facility];  Service: General;  Laterality: N/A;  . GYNECOLOGIC CRYOSURGERY    . MYOMECTOMY    . OVARIAN CYST REMOVAL    . PELVIC LAPAROSCOPY  75,88   DL lysis of adhesions    Social History   Socioeconomic History  . Marital status: Single    Spouse name: Not on file  . Number of children: 2  . Years of education: Not on file  . Highest education level: Not on file  Occupational History  . Occupation: Product manager: [employer]  . Financial resource strain: Not on file  . Food insecurity:    Worry: Not on file    Inability: Not on file  . Transportation needs:    Medical: Not on file    Non-medical: Not on file  Tobacco Use  . Smoking status: Never Smoker  . Smokeless tobacco: Never Used  Substance and Sexual Activity  . Alcohol use: Yes    Alcohol/week: 0.0 oz      Comment: very rare  . Drug use: No  . Sexual activity: Not Currently    Birth control/protection: Post-menopausal    Comment: 1st intercourse 62 yo-Fewer than 5 partners  Lifestyle  . Physical activity:    Days per week: Not on file    Minutes per session: Not on file  . Stress: Not on file  Relationships  . Social connections:  Talks on phone: Not on file    Gets together: Not on file    Attends religious service: Not on file    Active member of club or organization: Not on file    Attends meetings of clubs or organizations: Not on file    Relationship status: Not on file  Other Topics Concern  . 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 Cancer Maternal Grandmother        Colon Cancer  . Asthma Maternal Grandmother   . Diabetes Father   . Heart disease Father   . Hypertension Father   . Hyperlipidemia Father   . Hypertension Mother   . Heart disease Mother   . Ovarian cancer Mother   . Cancer Mother        Lung cancer  . Asthma Mother   . COPD Mother   . Hyperlipidemia Mother   . Diabetes Brother   . Hypertension Brother   . Kidney disease Brother   . Asthma Brother   . Heart disease Brother   . Hyperlipidemia Brother   . Cancer Brother        Prostate  . Graves' disease Sister   . Diabetes Sister   . Breast cancer Sister 34  . Graves' disease Paternal Grandmother   . Hypertension Paternal Grandmother   . Heart disease Paternal Grandmother   . Alzheimer's disease Paternal Grandmother   . Cancer Maternal Grandfather   . Heart failure Brother   . Heart failure Brother     Review of Systems  Respiratory: Negative for shortness of breath.   Cardiovascular: Negative for chest pain and palpitations.  Musculoskeletal: Positive for gait problem. Negative for back pain, myalgias and neck pain.  Neurological:  Positive for weakness and numbness.       Objective:   Vitals:   12/17/17 0931  BP: (!) 178/98  Pulse: 68  SpO2: 96%   BP Readings from Last 3 Encounters:  12/17/17 (!) 178/98  11/15/17 (!) 146/80  10/09/17 (!) 158/96   Wt Readings from Last 3 Encounters:  12/17/17 188 lb (85.3 kg)  11/15/17 192 lb 3.2 oz (87.2 kg)  10/09/17 204 lb (92.5 kg)   Body mass index is 30.34 kg/m.   Physical Exam  Constitutional: She is oriented to person, place, and time. She appears well-developed and well-nourished. No distress.  HENT:  Head: Normocephalic and atraumatic.  Cardiovascular: Normal rate, regular rhythm and normal heart sounds.  Pulmonary/Chest: Effort normal and breath sounds normal. No respiratory distress. She has no wheezes. She has no rales.  Musculoskeletal: She exhibits no edema.  Neurological: She is alert and oriented to person, place, and time. She displays abnormal reflex (Right patellar reflex absent, left patellar reflex normal). A sensory deficit (Decreased sensation plantar surface bilateral feet, normal sensation bilateral hands) is present.  Mild right foot drop  Skin: Skin is warm and dry. She is not diaphoretic.           Assessment & Plan:    See Problem List for Assessment and Plan of chronic medical problems.

## 2017-12-16 NOTE — Telephone Encounter (Signed)
3 attempts made to call pt with no call back.

## 2017-12-17 ENCOUNTER — Ambulatory Visit: Payer: BC Managed Care – PPO | Admitting: Internal Medicine

## 2017-12-17 ENCOUNTER — Encounter: Payer: Self-pay | Admitting: Internal Medicine

## 2017-12-17 VITALS — BP 178/98 | HR 68 | Ht 66.0 in | Wt 188.0 lb

## 2017-12-17 DIAGNOSIS — I10 Essential (primary) hypertension: Secondary | ICD-10-CM

## 2017-12-17 DIAGNOSIS — M21371 Foot drop, right foot: Secondary | ICD-10-CM | POA: Diagnosis not present

## 2017-12-17 DIAGNOSIS — R202 Paresthesia of skin: Secondary | ICD-10-CM

## 2017-12-17 DIAGNOSIS — R200 Anesthesia of skin: Secondary | ICD-10-CM | POA: Diagnosis not present

## 2017-12-17 NOTE — Assessment & Plan Note (Signed)
Blood pressure elevated here today Blood pressure at home typically well controlled She did not take her medication today Advised her to monitor her blood pressure religiously at home and return if it is frequently more than 140/90-stressed importance of keeping her blood pressure well controlled especially with her history

## 2017-12-17 NOTE — Assessment & Plan Note (Signed)
Right foot drop for approximately 1 month also with decreased right patellar deep tendon reflex, numbness/tingling right foot> left foot and hands Sugars currently controlled, B12 level within normal range Refer to neurology

## 2017-12-17 NOTE — Patient Instructions (Signed)
Monitor your Bp at home - your goal BP is < 140/90.   A referral was ordered for Dr Posey Pronto.

## 2017-12-17 NOTE — Assessment & Plan Note (Signed)
Numbness and tingling bilateral feet and hands-right foot is the worse.  Also concerning right foot drop and decreased right patellar reflex Vitamin B12 level normal and sugars currently controlled Referred to neurology

## 2017-12-20 NOTE — Telephone Encounter (Signed)
  Reason for Disposition . [1] MODERATE leg swelling (e.g., swelling extends up to knees) AND [2] new onset or worsening  Answer Assessment - Initial Assessment Questions 1. ONSET: "When did the swelling start?" (e.g., minutes, hours, days)     month 2. LOCATION: "What part of the leg is swollen?"  "Are both legs swollen or just one leg?"     Lower leg close to ankle and foot bilaterally 3. SEVERITY: "How bad is the swelling?" (e.g., localized; mild, moderate, severe)  - Localized - small area of swelling localized to one leg  - MILD pedal edema - swelling limited to foot and ankle, pitting edema < 1/4 inch (6 mm) deep, rest and elevation eliminate most or all swelling  - MODERATE edema - swelling of lower leg to knee, pitting edema > 1/4 inch (6 mm) deep, rest and elevation only partially reduce swelling  - SEVERE edema - swelling extends above knee, facial or hand swelling present      moderate 4. REDNESS: "Does the swelling look red or infected?"     no 5. PAIN: "Is the swelling painful to touch?" If so, ask: "How painful is it?"   (Scale 1-10; mild, moderate or severe)     no 6. FEVER: "Do you have a fever?" If so, ask: "What is it, how was it measured, and when did it start?"      no 7. CAUSE: "What do you think is causing the leg swelling?"     Pt doesn't know  8. MEDICAL HISTORY: "Do you have a history of heart failure, kidney disease, liver failure, or cancer?"     no 9. RECURRENT SYMPTOM: "Have you had leg swelling before?" If so, ask: "When was the last time?" "What happened that time?"     Yes intermittent was given spironolactone last time was a year ago 10. OTHER SYMPTOMS: "Do you have any other symptoms?" (e.g., chest pain, difficulty breathing)       Numbness and tingling right side is foot  And beginning  to have numbness and tingling  11. PREGNANCY: "Is there any chance you are pregnant?" "When was your last menstrual period?"       n/a  Protocols used: LEG SWELLING  AND EDEMA-A-AH

## 2017-12-20 NOTE — Telephone Encounter (Signed)
Pt c/o bilateral lower leg and feet edema, and numbness and tingling. Pt stated her symptoms began 1 month ago. Symptoms are constant. She stated that her right toes will bend down but will not bend up.  Her left foot has full range of motion. Pt was seen in office 12/17/17 for the same symptoms. Care advice given per disposition and appt made for pt 12/23/17 at 1 pm.

## 2017-12-20 NOTE — Telephone Encounter (Addendum)
  Reason for Disposition . [1] Numbness or tingling in one or both feet AND [2] is a chronic symptom (recurrent or ongoing AND present > 4 weeks)  Answer Assessment - Initial Assessment Questions 1. SYMPTOM: "What is the main symptom you are concerned about?" (e.g., weakness, numbness)     numbness and tingling to both feet 2. ONSET: "When did this start?" (minutes, hours, days; while sleeping)     1 month 3. LAST NORMAL: "When was the last time you were normal (no symptoms)?"     A few month ago 4. PATTERN "Does this come and go, or has it been constant since it started?"  "Is it present now?"     Constant- yes she has it now 5. CARDIAC SYMPTOMS: "Have you had any of the following symptoms: chest pain, difficulty breathing, palpitations?"     No  6. NEUROLOGIC SYMPTOMS: "Have you had any of the following symptoms: headache, dizziness, vision loss, double vision, changes in speech, unsteady on your feet?"     H/o diabetic macular edema but is under treatment, (Eyelea) shot every 4-6 weeks  7. OTHER SYMPTOMS: "Do you have any other symptoms?"     Right toes will bend down but not bend up 8. PREGNANCY: "Is there any chance you are pregnant?" "When was your last menstrual period?"     n/a  Protocols used: NEUROLOGIC DEFICIT-A-AH

## 2017-12-23 ENCOUNTER — Ambulatory Visit: Payer: BC Managed Care – PPO | Admitting: Internal Medicine

## 2018-01-06 ENCOUNTER — Encounter: Payer: Self-pay | Admitting: Neurology

## 2018-01-06 ENCOUNTER — Ambulatory Visit: Payer: BC Managed Care – PPO | Admitting: Neurology

## 2018-01-06 VITALS — BP 130/90 | HR 80 | Ht 66.0 in | Wt 183.2 lb

## 2018-01-06 DIAGNOSIS — R292 Abnormal reflex: Secondary | ICD-10-CM | POA: Diagnosis not present

## 2018-01-06 DIAGNOSIS — R202 Paresthesia of skin: Secondary | ICD-10-CM | POA: Diagnosis not present

## 2018-01-06 DIAGNOSIS — M21371 Foot drop, right foot: Secondary | ICD-10-CM | POA: Diagnosis not present

## 2018-01-06 NOTE — Patient Instructions (Addendum)
MRI lumbar spine without contrast   We will call you with the results and let you know if you need nerve conduction testing

## 2018-01-06 NOTE — Progress Notes (Signed)
Follow-up Visit   Date: 01/06/18    Rebekah Kennedy MRN: [id number] DOB: March 15, 1956   Interim History: Rebekah Kennedy is a 62 y.o. right-handed African American female with history of uncontrolled diabetes mellitus (dx 2008, HbA1c 11.8), hyperlipidemia, hypertension, depression, and R pontine stroke (2012, mild left residual weakness) returning to the clinic with new complaints of right foot paresthesia and weakness.  The patient was accompanied to the clinic by self.  History of present illness: About early September 2014, she notice stabbing pain over the left medial upper thigh, lasting 30 seconds. It spontaneously resolved. A week later, she had the same symptoms over the right leg and abdomen. It started occuring about once per day, but now occuring 1-3 x per day. The areas are localized to a quarter size area over the left inner upper thigh/groin, right anterior thigh, and bilateral lower anterior chest. There is no radiation or shooting quality to her symptoms. She denies any triggers. No exacerbating or alleviating factors. Denies any weakness or numbness/tingling of the feet. No saddle anesthesia or incontinence.  UPDATE 12/27/2017:  She is here with new complaints of right >> left foot numbness over the top of the foot and toes, which started in June.  Symptoms are constant without identifiable triggers.  She has also noticed weakness with extending her toe on the right and slight dragging of the foot.  She does not have weakness of the left foot, but has noticed intermittent numbness.  She denies any low back pain, falls, or trauma.   Medications:  Current Outpatient Medications on File Prior to Visit  Medication Sig Dispense Refill  . albuterol (PROVENTIL HFA;VENTOLIN HFA) 108 (90 Base) MCG/ACT inhaler Inhale 2 puffs into the lungs every 4 (four) hours as needed for wheezing or shortness of breath. 18 g 2  . amLODipine (NORVASC) 5 MG tablet Take 1 tablet (5 mg total) by  mouth daily. 90 tablet 3  . Biotin 5000 MCG TABS Take 5,000 mcg by mouth daily.     . Calcium Carb-Ergocalciferol (CHEWABLE CALCIUM/D PO) Take 1 tablet by mouth 3 (three) times daily.    . Carboxymethylcellul-Glycerin (LUBRICATING EYE DROPS OP) Place 1 drop into both eyes daily as needed (dry eyes).    . clopidogrel (PLAVIX) 75 MG tablet Take 1 tablet (75 mg total) by mouth daily. 90 tablet 3  . losartan (COZAAR) 100 MG tablet TAKE 1 TAB BY MOUTH ONCE DAILY. 90 tablet 1  . metoprolol succinate (TOPROL-XL) 100 MG 24 hr tablet TAKE 1 TABLET BY MOUTH DAILY WITH OR IMMEDIATELY FOLLOWING A MEAL 90 tablet 1  . montelukast (SINGULAIR) 10 MG tablet Take 1 tablet (10 mg total) by mouth at bedtime. (Patient taking differently: Take 10 mg by mouth daily. ) 90 tablet 3  . Multiple Vitamin (MULTIVITAMIN) tablet Take 1 tablet by mouth 2 (two) times daily. Barimets    . ondansetron (ZOFRAN) 4 MG tablet Take 1 tablet (4 mg total) by mouth every 8 (eight) hours as needed for nausea. 20 tablet 0  . pantoprazole (PROTONIX) 40 MG tablet Take 40 mg by mouth daily.  2  . polyethylene glycol (MIRALAX / GLYCOLAX) packet Take 17 g by mouth daily. 14 each 0  . rosuvastatin (CRESTOR) 10 MG tablet Take 1 tablet (10 mg total) by mouth daily. 90 tablet 3  . sitaGLIPtin (JANUVIA) 100 MG tablet Take 1 tablet (100 mg total) by mouth daily. 30 tablet 11  . spironolactone (ALDACTONE) 25 MG tablet Take  1 tablet (25 mg total) by mouth daily. 90 tablet 1   No current facility-administered medications on file prior to visit.     Allergies:  Allergies  Allergen Reactions  . Quinapril Cough    Review of Systems:  CONSTITUTIONAL: No fevers, chills, night sweats, or weight loss.  EYES: No visual changes or eye pain ENT: No hearing changes.  No history of nose bleeds.   RESPIRATORY: No cough, wheezing and shortness of breath.   CARDIOVASCULAR: Negative for chest pain, and palpitations.   GI: Negative for abdominal discomfort,  blood in stools or black stools.  No recent change in bowel habits.   GU:  No history of incontinence.   MUSCLOSKELETAL: No history of joint pain or swelling.  No myalgias.   SKIN: Negative for lesions, rash, and itching.   ENDOCRINE: Negative for cold or heat intolerance, polydipsia or goiter.   PSYCH:  No depression or anxiety symptoms.   NEURO: As Above.   Vital Signs:  BP 130/90   Pulse 80   Ht 5\' 6"  (1.676 m)   Wt 183 lb 4 oz (83.1 kg)   SpO2 97%   BMI 29.58 kg/m   General Medical Exam:   General:  Well appearing, comfortable  Eyes/ENT: see cranial nerve examination.   Neck: No masses appreciated.  Full range of motion without tenderness.  No carotid bruits. Respiratory:  Clear to auscultation, good air entry bilaterally.   Cardiac:  Regular rate and rhythm, no murmur.   Ext:  1+ pitting edema of the right ankle  Neurological Exam: MENTAL STATUS including orientation to time, place, person, recent and remote memory, attention span and concentration, language, and fund of knowledge is normal.  Speech is not dysarthric.  CRANIAL NERVES: No visual field defects.  Pupils equal round and reactive to light.  Normal conjugate, extra-ocular eye movements in all directions of gaze.  No ptosis.  Face is symmetric. Palate elevates symmetrically.  Tongue is midline.  MOTOR:  Motor strength is 5/5 in all extremities, except right dorsiflexion, eversion and toe extension is 4+/5, inversion is 5-/5.  No atrophy, fasciculations or abnormal movements.  No pronator drift.  Tone is normal.    MSRs:  Right                                                                 Left brachioradialis 2+  brachioradialis 2+  biceps 2+  biceps 2+  triceps 2+  triceps 2+  patellar 3+  patellar 3+  ankle jerk 1+  ankle jerk 1+  plantar response down  plantar response down   SENSORY:  Vibration is absent at the right great toe and ankle, intact on the left.  Temperature and pin prick is intact  throughout.  COORDINATION/GAIT:  Normal finger-to- nose-finger.  Intact rapid alternating movements bilaterally.  There is mild steppage gait on the right.  She is unable to stand on heels on the right, toe walking intact.     Data: Lab Results  Component Value Date   CHOL 215 (H) 07/18/2016   HDL 57.90 07/18/2016   LDLCALC 127 (H) 07/18/2016   LDLDIRECT 126.7 03/24/2013   TRIG 151.0 (H) 07/18/2016   CHOLHDL 4 07/18/2016   Lab Results  Component Value Date  HGBA1C 6.0 (A) 11/15/2017   MRI brain 04/2011: 1. Acute lacunar type infarcts in the right mid brain and pons. No mass effect or hemorrhage.  2. Superimposed chronic lacunar infarct in the right paracentral pons. These findings indicate acute on chronic small vessel ischemia.  3. Otherwise mild for age nonspecific cerebral white matter signal changes.  4. Intracranial MRA findings are below.  MRA brain 04/2011: 1. 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 No major branch occlusion. There is irregularity suggesting atherosclerosis in the right proximal PCA.  2. Mild anterior circulation atherosclerosis. No stenosis or major branch occlusion.   IMPRESSION/PLAN: 1.  Right foot drop and paresthesias.  I am concerned about L5 radiculopathy causing her foot drop and will start with MRI lumbar spine wo contrast.  Alternatively, peroneal mononeuropathy is possible and if imaging is negative, proceed with NCS/EMG of the legs.  2.  Right pontine stroke (2012, mild left hemiparesis, small vessel disease).  Continue secondary stroke prevention with plavix 75mg , crestor 10mg  daily, BP and diabetes control (improved).    Return to clinic in 4 months   Thank you for allowing me to participate in patient's care.  If I can answer any additional questions, I would be pleased to do so.    Sincerely,    Donika K. Posey Pronto, DO

## 2018-01-20 NOTE — Patient Instructions (Addendum)
  Test(s) ordered today. Your results will be released to MyChart (or called to you) after review, usually within 72hours after test completion. If any changes need to be made, you will be notified at that same time.  Flu immunization administered today.    Medications reviewed and updated.  No changes recommended at this time.    Please followup in 6months   

## 2018-01-20 NOTE — Progress Notes (Signed)
Subjective:    Patient ID: Rebekah Kennedy, female    DOB: 09-Jul-1955, 62 y.o.   MRN: [id number]  HPI The patient is here for follow up.  Obesity:  She is s/p gastric sleeve on 08/12/17.  She is doing well.  On occasion she does have some nausea after eating, but that has improved.  She has learned what food she can and cannot tolerate.  She has not needed the Zofran while.  Hypertension: She is taking her medication daily, but did not take her medications this morning.  She was rushing out of the house and forgot to take her medications.  She states she usually does take them.  She is compliant with a low sodium diet.  She has had some leg swelling recently.  She does sit most of the day.  She is not exercising regularly.  She denies chest pain, palpitations, shortness of breath and regular headaches.   Hyperlipidemia: She is taking her medication daily. She is compliant with a low fat/cholesterol diet. She is not exercising regularly. She denies myalgias.   H/o TIA:  She is taking plavix.  Her right foot drop is unchanged.  She has seen neurology and will be having an MRI of her lower back.  She is using a cane to ambulate.  Diabetes: she is following with Dr Loanne Drilling.  She is taking her medication daily as prescribed. She is compliant with a diabetic diet. She is  Not exercising regularly. She checks her feet daily and denies foot lesions. She is up-to-date with an ophthalmology examination.   GERD:  She is taking her medication daily as prescribed.  She denies any GERD symptoms and feels her GERD is well controlled.    Medications and allergies reviewed with patient and updated if appropriate.  Patient Active Problem List   Diagnosis Date Noted  . Numbness and tingling 12/17/2017  . Right foot drop 12/17/2017  . Hypocalcemia 12/12/2017  . Atherosclerosis of aorta ([place]) 10/09/2017  . AKI (acute kidney injury) ([place])   . Nausea & vomiting 09/22/2017  . Bariatric surgery status  08/19/2017  . Constipation 08/19/2017  . Hypertensive urgency 05/23/2017  . Greater trochanteric bursitis of both hips 12/19/2016  . OSA (obstructive sleep apnea) 07/26/2016  . Hypersomnia 06/11/2016  . Diabetes ([place]) 07/19/2015  . History of colonic polyps 12/01/2014  . Morbidly obese ([place]) 10/05/2013  . Partial nontraumatic tear of right rotator cuff 09/21/2013  . Goiter 12/10/2011  . Asthma 12/10/2011  . 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 TIA (transient ischemic attack) 02/13/2011  . PROTEINURIA, MILD 09/15/2009  . Hyperlipidemia 12/01/2006  . Essential hypertension 07/07/2006    Current Outpatient Medications on File Prior to Visit  Medication Sig Dispense Refill  . albuterol (PROVENTIL HFA;VENTOLIN HFA) 108 (90 Base) MCG/ACT inhaler Inhale 2 puffs into the lungs every 4 (four) hours as needed for wheezing or shortness of breath. 18 g 2  . amLODipine (NORVASC) 5 MG tablet Take 1 tablet (5 mg total) by mouth daily. 90 tablet 3  . Biotin 5000 MCG TABS Take 5,000 mcg by mouth daily.     . Calcium Carb-Ergocalciferol (CHEWABLE CALCIUM/D PO) Take 1 tablet by mouth 3 (three) times daily.    . Carboxymethylcellul-Glycerin (LUBRICATING EYE DROPS OP) Place 1 drop into both eyes daily as needed (dry eyes).    . clopidogrel (PLAVIX) 75 MG tablet Take 1 tablet (75 mg total) by mouth daily. 90 tablet 3  . losartan (COZAAR)  100 MG tablet TAKE 1 TAB BY MOUTH ONCE DAILY. 90 tablet 1  . metoprolol succinate (TOPROL-XL) 100 MG 24 hr tablet TAKE 1 TABLET BY MOUTH DAILY WITH OR IMMEDIATELY FOLLOWING A MEAL 90 tablet 1  . montelukast (SINGULAIR) 10 MG tablet Take 1 tablet (10 mg total) by mouth at bedtime. (Patient taking differently: Take 10 mg by mouth daily. ) 90 tablet 3  . Multiple Vitamin (MULTIVITAMIN) tablet Take 1 tablet by mouth 2 (two) times daily. Barimets    . ondansetron (ZOFRAN) 4 MG tablet Take 1 tablet (4 mg total) by mouth every 8 (eight) hours as needed for nausea. 20 tablet  0  . pantoprazole (PROTONIX) 40 MG tablet Take 40 mg by mouth daily.  2  . polyethylene glycol (MIRALAX / GLYCOLAX) packet Take 17 g by mouth daily. 14 each 0  . rosuvastatin (CRESTOR) 10 MG tablet Take 1 tablet (10 mg total) by mouth daily. 90 tablet 3  . sitaGLIPtin (JANUVIA) 100 MG tablet Take 1 tablet (100 mg total) by mouth daily. 30 tablet 11  . spironolactone (ALDACTONE) 25 MG tablet Take 1 tablet (25 mg total) by mouth daily. 90 tablet 1   No current facility-administered medications on file prior to visit.     Past Medical History:  Diagnosis Date  . ASTHMA NOS W/ACUTE EXACERBATION 07/10/2010  . Cervical dysplasia   . DEPRESSION 03/16/2007  . DIABETES MELLITUS, TYPE II 12/01/2006  . Dysmenorrhea   . Fibroid   . HYPERLIPIDEMIA 12/01/2006  . HYPERTENSION 07/07/2006  . Obesity   . Osteopenia 05/2017   T score -1.5 FRAX 2.6% / 0.1%  . Retinal edema    gets steriod inj in the eyes    . Sleep apnea    HAS CPAP BUT ADMITS DOES NOT USE JUDICIALLY   . Splenomegaly    in college  . Stroke [facility]) 2012   R pontine, mild residual left hemiparesis  . TIA (transient ischemic attack) 2012   2 TIAs 1 week apart of each other     Past Surgical History:  Procedure Laterality Date  . 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 APPENDECTOMY    . BIOPSY THYROID  05/02/11   Nonneoplastic goiter  . BREAST BIOPSY    . BREAST LUMPECTOMY WITH RADIOACTIVE SEED LOCALIZATION Left 07/25/2017   Procedure: LEFT BREAST LUMPECTOMY WITH RADIOACTIVE SEED LOCALIZATION;  Surgeon: Coralie Keens, MD;  Location: [facility];  Service: General;  Laterality: Left;  . BREAST SURGERY     Reduction  . COLPOSCOPY    . DILATION AND CURETTAGE OF UTERUS  1975   DUB  . EYE SURGERY     Laser  . GASTRIC ROUX-EN-Y N/A 08/12/2017   Procedure: LAPAROSCOPIC ROUX-EN-Y GASTRIC BYPASS WITH HIATAL HERNIA REPAIR AND UPPER ENDOSCOPY;  Surgeon: [name], [name], MD;  Location: [facility];  Service: General;  Laterality: N/A;  .  GYNECOLOGIC CRYOSURGERY    . MYOMECTOMY    . OVARIAN CYST REMOVAL    . PELVIC LAPAROSCOPY  75,88   DL lysis of adhesions    Social History   Socioeconomic History  . Marital status: Single    Spouse name: Not on file  . Number of children: 2  . Years of education: 16  . Highest education level: Bachelor's degree (e.g., BA, AB, BS)  Occupational History  . Occupation: Product manager: [employer]  . Financial resource strain: Not on file  . Food insecurity:  Worry: Not on file    Inability: Not on file  . Transportation needs:    Medical: Not on file    Non-medical: Not on file  Tobacco Use  . Smoking status: Never Smoker  . Smokeless tobacco: Never Used  Substance and Sexual Activity  . Alcohol use: Yes    Alcohol/week: 0.0 standard drinks    Comment: very rare  . Drug use: No  . Sexual activity: Not Currently    Birth control/protection: Post-menopausal    Comment: 1st intercourse 62 yo-Fewer than 5 partners  Lifestyle  . Physical activity:    Days per week: Not on file    Minutes per session: Not on file  . Stress: Not on file  Relationships  . Social connections:    Talks on phone: Not on file    Gets together: Not on file    Attends religious service: Not on file    Active member of club or organization: Not on file    Attends meetings of clubs or organizations: Not on file    Relationship status: Not on file  Other Topics Concern  . 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 Cancer Maternal Grandmother        Colon Cancer  . Asthma Maternal Grandmother   . Diabetes Father   . Heart disease Father   . Hypertension Father   . Hyperlipidemia Father   . Hypertension Mother   . Heart disease Mother   . Ovarian cancer Mother   . Cancer Mother        Lung cancer  . Asthma Mother     . COPD Mother   . Hyperlipidemia Mother   . Diabetes Brother   . Hypertension Brother   . Kidney disease Brother   . Asthma Brother   . Heart disease Brother   . Hyperlipidemia Brother   . Cancer Brother        Prostate  . Graves' disease Sister   . Diabetes Sister   . Breast cancer Sister 48  . Graves' disease Paternal Grandmother   . Hypertension Paternal Grandmother   . Heart disease Paternal Grandmother   . Alzheimer's disease Paternal Grandmother   . Cancer Maternal Grandfather   . Heart failure Brother   . Heart failure Brother     Review of Systems  Constitutional: Negative for chills and fever.  Respiratory: Negative for cough, shortness of breath and wheezing.   Cardiovascular: Positive for leg swelling. Negative for chest pain and palpitations.  Neurological: Negative for light-headedness and headaches.       Objective:   Vitals:   01/22/18 1538  BP: (!) 164/106  Pulse: 69  Resp: 16  Temp: 98.8 F (37.1 C)  SpO2: 98%   BP Readings from Last 3 Encounters:  01/22/18 (!) 164/106  01/06/18 130/90  12/17/17 (!) 178/98   Wt Readings from Last 3 Encounters:  01/22/18 182 lb (82.6 kg)  01/06/18 183 lb 4 oz (83.1 kg)  12/17/17 188 lb (85.3 kg)   Body mass index is 29.38 kg/m.   Physical Exam    Constitutional: Appears well-developed and well-nourished. No distress.  HENT:  Head: Normocephalic and atraumatic.  Neck: Neck supple. No tracheal deviation present. No thyromegaly present.  No cervical lymphadenopathy Cardiovascular: Normal rate, regular rhythm  and normal heart sounds.   No murmur heard. No carotid bruit .  Trace bilateral lower extremity edema Pulmonary/Chest: Effort normal and breath sounds normal. No respiratory distress. No has no wheezes. No rales.  Skin: Skin is warm and dry. Not diaphoretic.  Psychiatric: Normal mood and affect. Behavior is normal.      Assessment & Plan:    See Problem List for Assessment and Plan of chronic  medical problems.

## 2018-01-21 ENCOUNTER — Ambulatory Visit
Admission: RE | Admit: 2018-01-21 | Discharge: 2018-01-21 | Disposition: A | Discharge status: Home / Self Care | Payer: BC Managed Care – PPO | Source: Ambulatory Visit | Attending: Neurology | Admitting: Neurology

## 2018-01-21 DIAGNOSIS — R292 Abnormal reflex: Secondary | ICD-10-CM

## 2018-01-21 DIAGNOSIS — R202 Paresthesia of skin: Secondary | ICD-10-CM

## 2018-01-21 DIAGNOSIS — M21371 Foot drop, right foot: Secondary | ICD-10-CM

## 2018-01-22 ENCOUNTER — Ambulatory Visit: Payer: BC Managed Care – PPO | Admitting: Internal Medicine

## 2018-01-22 ENCOUNTER — Encounter: Payer: Self-pay | Admitting: Internal Medicine

## 2018-01-22 ENCOUNTER — Other Ambulatory Visit (INDEPENDENT_AMBULATORY_CARE_PROVIDER_SITE_OTHER): Payer: BC Managed Care – PPO

## 2018-01-22 VITALS — BP 164/106 | HR 69 | Temp 98.8°F | Resp 16 | Ht 66.0 in | Wt 182.0 lb

## 2018-01-22 DIAGNOSIS — E7849 Other hyperlipidemia: Secondary | ICD-10-CM | POA: Diagnosis not present

## 2018-01-22 DIAGNOSIS — Z23 Encounter for immunization: Secondary | ICD-10-CM | POA: Diagnosis not present

## 2018-01-22 DIAGNOSIS — E1151 Type 2 diabetes mellitus with diabetic peripheral angiopathy without gangrene: Secondary | ICD-10-CM

## 2018-01-22 DIAGNOSIS — Z794 Long term (current) use of insulin: Secondary | ICD-10-CM

## 2018-01-22 DIAGNOSIS — I10 Essential (primary) hypertension: Secondary | ICD-10-CM

## 2018-01-22 DIAGNOSIS — Z8673 Personal history of transient ischemic attack (TIA), and cerebral infarction without residual deficits: Secondary | ICD-10-CM

## 2018-01-22 LAB — CBC WITH DIFFERENTIAL/PLATELET
BASOS ABS: 0.1 10*3/uL (ref 0.0–0.1)
Basophils Relative: 1.1 % (ref 0.0–3.0)
Eosinophils Absolute: 0.5 10*3/uL (ref 0.0–0.7)
Eosinophils Relative: 5.4 % — ABNORMAL HIGH (ref 0.0–5.0)
HCT: 35.2 % — ABNORMAL LOW (ref 36.0–46.0)
Hemoglobin: 11.6 g/dL — ABNORMAL LOW (ref 12.0–15.0)
Lymphocytes Relative: 38 % (ref 12.0–46.0)
Lymphs Abs: 3.8 10*3/uL (ref 0.7–4.0)
MCHC: 33 g/dL (ref 30.0–36.0)
MCV: 83.7 fl (ref 78.0–100.0)
MONOS PCT: 5.9 % (ref 3.0–12.0)
Monocytes Absolute: 0.6 10*3/uL (ref 0.1–1.0)
NEUTROS PCT: 49.6 % (ref 43.0–77.0)
Neutro Abs: 5 10*3/uL (ref 1.4–7.7)
Platelets: 276 10*3/uL (ref 150.0–400.0)
RBC: 4.21 Mil/uL (ref 3.87–5.11)
RDW: 13.8 % (ref 11.5–15.5)
WBC: 10 10*3/uL (ref 4.0–10.5)

## 2018-01-22 LAB — COMPREHENSIVE METABOLIC PANEL
ALT: 9 U/L (ref 0–35)
AST: 10 U/L (ref 0–37)
Albumin: 3.9 g/dL (ref 3.5–5.2)
Alkaline Phosphatase: 53 U/L (ref 39–117)
BUN: 14 mg/dL (ref 6–23)
CALCIUM: 9.4 mg/dL (ref 8.4–10.5)
CHLORIDE: 107 meq/L (ref 96–112)
CO2: 33 meq/L — AB (ref 19–32)
Creatinine, Ser: 0.74 mg/dL (ref 0.40–1.20)
GFR: 102.46 mL/min (ref >60.00)
Glucose, Bld: 104 mg/dL — ABNORMAL HIGH (ref 70–99)
Potassium: 3.9 mEq/L (ref 3.5–5.1)
Sodium: 143 mEq/L (ref 135–145)
Total Bilirubin: 0.5 mg/dL (ref 0.2–1.2)
Total Protein: 6.7 g/dL (ref 6.0–8.3)

## 2018-01-22 LAB — LIPID PANEL
CHOL/HDL RATIO: 3
Cholesterol: 159 mg/dL (ref 0–200)
HDL: 53 mg/dL (ref >39.00)
LDL Cholesterol: 82 mg/dL (ref 0–99)
NonHDL: 105.76
Triglycerides: 119 mg/dL (ref 0.0–149.0)
VLDL: 23.8 mg/dL (ref 0.0–40.0)

## 2018-01-22 LAB — HEMOGLOBIN A1C: HEMOGLOBIN A1C: 5.8 % (ref 4.6–6.5)

## 2018-01-22 NOTE — Assessment & Plan Note (Signed)
Management per Dr. Loanne Drilling We are getting blood work today so we will go ahead and check her A1c but sugars have been very well controlled

## 2018-01-22 NOTE — Assessment & Plan Note (Addendum)
Blood pressure very high here today because she did not take her medication this morning.  She states she was rushing this morning and did not have time to take it.  Typically she takes it every day Stressed the importance of taking her medication on a daily basis Advised carrying a couple pills with her in case this happens again so that she can take her medication at work No changes made today CMP, CBC

## 2018-01-22 NOTE — Assessment & Plan Note (Signed)
Continue Crestor daily Check lipid panel, CMP

## 2018-01-22 NOTE — Assessment & Plan Note (Signed)
Continue Crestor and Plavix daily Stressed taking her medication on a daily basis to prevent further TIA/stroke

## 2018-01-22 NOTE — Assessment & Plan Note (Signed)
History of stroke Continue Plavix daily Continue Crestor daily Stressed better blood pressure controlled-stressed the importance of her taking the medication on a daily basis

## 2018-01-28 ENCOUNTER — Encounter: Payer: BC Managed Care – PPO | Attending: General Surgery | Admitting: Registered"

## 2018-01-28 DIAGNOSIS — E119 Type 2 diabetes mellitus without complications: Secondary | ICD-10-CM

## 2018-01-28 DIAGNOSIS — I10 Essential (primary) hypertension: Secondary | ICD-10-CM | POA: Diagnosis not present

## 2018-01-28 DIAGNOSIS — E1169 Type 2 diabetes mellitus with other specified complication: Secondary | ICD-10-CM | POA: Diagnosis not present

## 2018-01-28 DIAGNOSIS — Z9884 Bariatric surgery status: Secondary | ICD-10-CM | POA: Insufficient documentation

## 2018-01-29 NOTE — Progress Notes (Signed)
Follow-up visit:  Post-Operative RYGB Surgery  Medical Nutrition Therapy:  Appt start time: 6:00pm end time:  7:00pm  Primary concerns today: Post-operative Bariatric Surgery Nutrition Management 6 Month Post-Op Class  Surgery date: 08/12/2017 Surgery type: RYGB Start weight at [facility]: 251 lbs Weight today: 179.6 lbs Weight change: 24 lbs from 203.6 (10/08/2017) Total weight lost: 71.4 lbs Weight loss goal: eliminate diabetes, preserve vision   TANITA  BODY COMP RESULTS  08/27/2017 10/08/2017 01/28/2018   BMI (kg/m^2) Pt declined 32.9 29.0   Fat Mass (lbs)  86.8 64.8   Fat Free Mass (lbs)  116.8 114.8   Total Body Water (lbs)  83.6 81.0     Information Reviewed/ Discussed During Appointment: -Review of composition scale numbers -Fluid requirements (64-100 ounces) -Protein requirements (60-80g) -Strategies for tolerating diet -Advancement of diet to include Starchy vegetables -Barriers to inclusion of new foods -Inclusion of appropriate multivitamin and calcium supplements  -Exercise recommendations   Fluid intake: 40-50 oz  Medications: See list; added crestor Supplementation: yes  CBG monitoring: 2-3x/week Average CBG per patient: FBS (91-100) after meals (150-180) Last patient reported A1c: 5.8  Using straws:  Drinking while eating:  Having you been chewing well: Chewing/swallowing difficulties:  Changes in vision: no Changes to mood/headaches: no Hair loss/Cahnges to skin/Changes to nails: no Any difficulty focusing or concentrating: no Sweating: no Dizziness/Lightheaded:  Palpitations: no  Carbonated beverages: no N/V/D/C/GAS: no, no, no, no, no Abdominal Pain: no Dumping syndrome: no  Progress Towards Goal(s):  In progress.  Handouts given during visit include:  Phase V diet Progression   Goals Sheet  The Benefits of Exercise are endless.....  Support Group Topics  Pt Chosen Goals:   I will drink 32 ounces of plain water 7 days a week by  02/27/2018.   I will not drink or sip a beverage with 3 of my meals 7 days a week by 02/27/2018.   Teaching Method Utilized:  Visual Auditory Hands on   Demonstrated degree of understanding via:  Teach Back   Monitoring/Evaluation:  Dietary intake, exercise, and body weight. Follow up in 3 months for 9 month post-op visit.

## 2018-02-18 ENCOUNTER — Ambulatory Visit: Payer: BC Managed Care – PPO | Admitting: Endocrinology

## 2018-02-24 ENCOUNTER — Other Ambulatory Visit: Payer: Self-pay | Admitting: Neurology

## 2018-02-27 ENCOUNTER — Ambulatory Visit
Admission: RE | Admit: 2018-02-27 | Discharge: 2018-02-27 | Disposition: A | Discharge status: Home / Self Care | Payer: BC Managed Care – PPO | Source: Ambulatory Visit | Attending: Neurology | Admitting: Neurology

## 2018-02-28 ENCOUNTER — Telehealth: Payer: Self-pay | Admitting: *Deleted

## 2018-02-28 NOTE — Telephone Encounter (Signed)
-----   Message from Alda Berthold, DO sent at 02/28/2018  9:23 AM EDT ----- Please inform patient that her MRI lumbar spine show mild age-related changes, nothing causing nerve impingement, so foot drop is not coming from her back.  We need to order NCS/EMG of the right > left foot next to see if there is a nerve pinched in the leg causing her foot drop.

## 2018-02-28 NOTE — Telephone Encounter (Signed)
Left message for patient to call me back. 

## 2018-03-03 ENCOUNTER — Telehealth: Payer: Self-pay | Admitting: *Deleted

## 2018-03-03 NOTE — Telephone Encounter (Signed)
Patient given the results and EMG scheduled.

## 2018-03-03 NOTE — Telephone Encounter (Signed)
-----   Message from Alda Berthold, DO sent at 02/28/2018  9:23 AM EDT ----- Please inform patient that her MRI lumbar spine show mild age-related changes, nothing causing nerve impingement, so foot drop is not coming from her back.  We need to order NCS/EMG of the right > left foot next to see if there is a nerve pinched in the leg causing her foot drop.

## 2018-03-04 ENCOUNTER — Other Ambulatory Visit: Payer: Self-pay | Admitting: *Deleted

## 2018-03-04 DIAGNOSIS — R202 Paresthesia of skin: Secondary | ICD-10-CM

## 2018-03-04 DIAGNOSIS — R292 Abnormal reflex: Secondary | ICD-10-CM

## 2018-03-04 DIAGNOSIS — M21371 Foot drop, right foot: Secondary | ICD-10-CM

## 2018-03-12 ENCOUNTER — Encounter: Payer: Self-pay | Admitting: Gastroenterology

## 2018-04-01 ENCOUNTER — Ambulatory Visit (INDEPENDENT_AMBULATORY_CARE_PROVIDER_SITE_OTHER): Payer: BC Managed Care – PPO | Admitting: Neurology

## 2018-04-01 DIAGNOSIS — R202 Paresthesia of skin: Secondary | ICD-10-CM | POA: Diagnosis not present

## 2018-04-01 DIAGNOSIS — M21371 Foot drop, right foot: Secondary | ICD-10-CM | POA: Diagnosis not present

## 2018-04-01 DIAGNOSIS — R292 Abnormal reflex: Secondary | ICD-10-CM | POA: Diagnosis not present

## 2018-04-01 DIAGNOSIS — G5731 Lesion of lateral popliteal nerve, right lower limb: Secondary | ICD-10-CM

## 2018-04-01 NOTE — Progress Notes (Signed)
Follow-up Visit   Date: 04/01/18    Rebekah Kennedy MRN: [id number] DOB: Mar 23, 1956   Interim History: Rebekah Kennedy is a 62 y.o. right-handed African American female with history of uncontrolled diabetes mellitus (dx 2008, HbA1c 11.8), hyperlipidemia, hypertension, depression, and R pontine stroke (2012, mild left residual weakness) returning to the clinic with new complaints of right foot paresthesia and weakness.  The patient was accompanied to the clinic by self.  History of present illness: About early September 2014, she notice stabbing pain over the left medial upper thigh, lasting 30 seconds. It spontaneously resolved. A week later, she had the same symptoms over the right leg and abdomen. It started occuring about once per day, but now occuring 1-3 x per day. The areas are localized to a quarter size area over the left inner upper thigh/groin, right anterior thigh, and bilateral lower anterior chest. There is no radiation or shooting quality to her symptoms. She denies any triggers. No exacerbating or alleviating factors. Denies any weakness or numbness/tingling of the feet. No saddle anesthesia or incontinence.  UPDATE 12/27/2017:  She is here with new complaints of right >> left foot numbness over the top of the foot and toes, which started in June.  Symptoms are constant without identifiable triggers.  She has also noticed weakness with extending her toe on the right and slight dragging of the foot.  She does not have weakness of the left foot, but has noticed intermittent numbness.  She denies any low back pain, falls, or trauma.   UPDATE 04/01/2018:  She is here for electrodiagnostic testing of the legs and to discuss the results of MRI lumbar spine.  Imaging did not show impingement of the L5 nerve, which would explain her foot drop.  She continues to have foot weakness and frequently trips.  She has started to use a cane for balance, which helps.  Medications:    Current Outpatient Medications on File Prior to Visit  Medication Sig Dispense Refill  . albuterol (PROVENTIL HFA;VENTOLIN HFA) 108 (90 Base) MCG/ACT inhaler Inhale 2 puffs into the lungs every 4 (four) hours as needed for wheezing or shortness of breath. 18 g 2  . amLODipine (NORVASC) 5 MG tablet Take 1 tablet (5 mg total) by mouth daily. 90 tablet 3  . Biotin 5000 MCG TABS Take 5,000 mcg by mouth daily.     . Calcium Carb-Ergocalciferol (CHEWABLE CALCIUM/D PO) Take 1 tablet by mouth 3 (three) times daily.    . Carboxymethylcellul-Glycerin (LUBRICATING EYE DROPS OP) Place 1 drop into both eyes daily as needed (dry eyes).    . clopidogrel (PLAVIX) 75 MG tablet Take 1 tablet (75 mg total) by mouth daily. 90 tablet 3  . losartan (COZAAR) 100 MG tablet TAKE 1 TAB BY MOUTH ONCE DAILY. 90 tablet 1  . metoprolol succinate (TOPROL-XL) 100 MG 24 hr tablet TAKE 1 TABLET BY MOUTH DAILY WITH OR IMMEDIATELY FOLLOWING A MEAL 90 tablet 1  . montelukast (SINGULAIR) 10 MG tablet Take 1 tablet (10 mg total) by mouth at bedtime. (Patient taking differently: Take 10 mg by mouth daily. ) 90 tablet 3  . Multiple Vitamin (MULTIVITAMIN) tablet Take 1 tablet by mouth 2 (two) times daily. Barimets    . pantoprazole (PROTONIX) 40 MG tablet Take 40 mg by mouth daily.  2  . polyethylene glycol (MIRALAX / GLYCOLAX) packet Take 17 g by mouth daily. 14 each 0  . rosuvastatin (CRESTOR) 10 MG tablet Take 1 tablet (10  mg total) by mouth daily. 90 tablet 3  . sitaGLIPtin (JANUVIA) 100 MG tablet Take 1 tablet (100 mg total) by mouth daily. 30 tablet 11  . spironolactone (ALDACTONE) 25 MG tablet Take 1 tablet (25 mg total) by mouth daily. 90 tablet 1   No current facility-administered medications on file prior to visit.     Allergies:  Allergies  Allergen Reactions  . Quinapril Cough    Review of Systems:  CONSTITUTIONAL: No fevers, chills, night sweats, or weight loss.  EYES: No visual changes or eye pain ENT: No  hearing changes.  No history of nose bleeds.   RESPIRATORY: No cough, wheezing and shortness of breath.   CARDIOVASCULAR: Negative for chest pain, and palpitations.   GI: Negative for abdominal discomfort, blood in stools or black stools.  No recent change in bowel habits.   GU:  No history of incontinence.   MUSCLOSKELETAL: No history of joint pain or swelling.  No myalgias.   SKIN: Negative for lesions, rash, and itching.   ENDOCRINE: Negative for cold or heat intolerance, polydipsia or goiter.   PSYCH:  No depression or anxiety symptoms.   NEURO: As Above.   Vital Signs:  There were no vitals taken for this visit.  Neurological Exam: MENTAL STATUS including orientation to time, place, person, recent and remote memory, attention span and concentration, language, and fund of knowledge is normal.  Speech is not dysarthric.  CRANIAL NERVES: Face is symmetric.  MOTOR:  Motor strength is 5/5 in all extremities, except right dorsiflexion, eversion and toe extension is 4+/5, inversion is 5-/5.  No atrophy, fasciculations or abnormal movements.  No pronator drift.  Tone is normal.    COORDINATION/GAIT:  Normal finger-to- nose-finger.  Intact rapid alternating movements bilaterally.  There is mild steppage gait on the right.   Data: Lab Results  Component Value Date   CHOL 159 01/22/2018   HDL 53.00 01/22/2018   LDLCALC 82 01/22/2018   LDLDIRECT 126.7 03/24/2013   TRIG 119.0 01/22/2018   CHOLHDL 3 01/22/2018   Lab Results  Component Value Date   HGBA1C 5.8 01/22/2018   MRI brain 04/2011: 1. Acute lacunar type infarcts in the right mid brain and pons. No mass effect or hemorrhage.  2. Superimposed chronic lacunar infarct in the right paracentral pons. These findings indicate acute on chronic small vessel ischemia.  3. Otherwise mild for age nonspecific cerebral white matter signal changes.  4. Intracranial MRA findings are below.  MRA brain 04/2011: 1. Dolichoectasia of the  posterior circulation without associated stenosis. No major branch occlusion. There is irregularity suggesting atherosclerosis in the right proximal PCA.  2. Mild anterior circulation atherosclerosis. No stenosis or major branch occlusion.  MRI lumbar spine 02/28/2018: 1. Mild disc bulging and facet hypertrophy at L3-4, L4-5, and L5-S1 contributes to mild foraminal narrowing. 2. No other significant stenosis is evident to explain foot drop.  Lab Results  Component Value Date   TSH 1.471 05/24/2017   Lab Results  Component Value Date   HGBA1C 5.8 01/22/2018     IMPRESSION/PLAN: 1.  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  She denies crossing the legs were very wearing any type compressive socks.  Diabetes is well controlled.  Will check a few additional labs for potential causes including ESR, CRP, ANA, SSA/B, MMA, vitamin B1, copper, SPEP with IFE.  Refer to physical therapy for leg strengthening and to biotech for right AFO.  Temporary handicap  placard form was given.  2. 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 Right pontine stroke (2012, mild left hemiparesis, small vessel disease).  Continue secondary stroke prevention with plavix 48m, crestor 137mdaily, BP and diabetes control (improved).    Greater than 50% of this 25 minute visit was spent in counseling, explanation of diagnosis, planning of further management, and coordination of care.   Thank you for allowing me to participate in patient's care.  If I can answer any additional questions, I would be pleased to do so.    Sincerely,    Donika K. PaPosey ProntoDO

## 2018-04-01 NOTE — Procedures (Signed)
[facility]  [place]  [place], [place] [address] Tel: [phone] Fax:  [phone] Test Date:  04/01/2018  Patient: Rebekah Kennedy DOB: 1956-04-02 Physician: Narda Amber, DO  Sex: Female Height: 5\' 6"  Ref Phys: Narda Amber, DO  ID#: [id number] Temp: 35.0C Technician:    Patient Complaints: This is a 62 year old female referred for evaluation of right foot drop.  NCV & EMG Findings: Extensive electrodiagnostic testing of the right lower extremity and additional studies of the left shows:  1. Bilateral sural and superficial peroneal sensory responses are absent. 2. Right peroneal motor response at the extensor digitorum brevis is absent.  Right peroneal motor response at the tibialis anterior shows prolonged distal onset latency (5.9 ms), severely reduced amplitude (0.6 mV) with conduction block, and decreased conduction velocity (Poplit-Fib Head, 16 m/s).  Bilateral tibial motor responses show reduced amplitude t (L3.8, R3.4 mV).   3. Tibial H reflex study is prolonged on the right and normal on the left.   4. Severe active motor axonal loss changes are seen affecting the right tibialis anterior, fibularis longus, and extensor hallucis longus muscles, with no motor unit unit recruitment seen in the latter two muscles, despite maximal activation.  Sparse chronic motor axonal loss changes are seen affecting bilateral flexor digitorum longus muscles.  Impression: 1. Subacute right peroneal mononeuropathy at the fibular head, axonal loss and demyelinating and type, which is severe in degree electrically 2. 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   ___________________________ Narda Amber, DO    Nerve Conduction Studies Anti Sensory Summary Table   Site NR Peak (ms) Norm Peak (ms) P-T Amp (V) Norm P-T Amp  Left Sup Peroneal Anti Sensory (Ant Lat Mall)  35C  12 cm NR  <4.6  >3  Right Sup Peroneal Anti Sensory  (Ant Lat Mall)  35C  12 cm NR  <4.6  >3  Left Sural Anti Sensory (Lat Mall)  35C  Calf NR  <4.6  >3  Right Sural Anti Sensory (Lat Mall)  35C  Calf NR  <4.6  >3   Motor Summary Table   Site NR Onset (ms) Norm Onset (ms) O-P Amp (mV) Norm O-P Amp Site1 Site2 Delta-0 (ms) Dist (cm) Vel (m/s) Norm Vel (m/s)  Left Peroneal Motor (Ext Dig Brev)  35C  Ankle    3.0 <6.0 3.3 >2.5 B Fib Ankle 8.0 36.0 45 >40  B Fib    11.0  2.4  Poplt B Fib 1.5 7.0 47 >40  Poplt    12.5  2.3         Right Peroneal Motor (Ext Dig Brev)  35C  Ankle NR  <6.0  >2.5 B Fib Ankle  0.0  >40  B Fib NR     Poplt B Fib  0.0  >40  Poplt NR            Left Peroneal TA Motor (Tib Ant)  35C  Fib Head    2.8 <4.5 3.3 >3 Poplit Fib Head 1.6 7.0 44 >40  Poplit    4.4  3.2         Right Peroneal TA Motor (Tib Ant)  35C  Fib Head    5.9 <4.5 0.6 >3 Poplit Fib Head 4.4 7.0 16 >40  Poplit    10.3  0.2         Left Tibial Motor (Abd Hall Brev)  35C  Ankle    5.4 <6.0 3.8 >4 Knee Ankle  9.6 38.0 40 >40  Knee    15.0  3.4         Right Tibial Motor (Abd Hall Brev)  35C  Ankle    5.2 <6.0 3.4 >4 Knee Ankle 9.4 40.0 43 >40  Knee    14.6  3.2          H Reflex Studies   NR H-Lat (ms) Lat Norm (ms) L-R H-Lat (ms)  Left Tibial (Gastroc)  35C     32.52 <35 3.81  Right Tibial (Gastroc)  35C     36.33 <35 3.81   EMG   Side Muscle Ins Act Fibs Psw Fasc Number Recrt Dur Dur. Amp Amp. Poly Poly. Comment  Right AntTibialis Nml 1+ Nml Nml SMU Rapid All 1+ All 1+ Nml Nml ATR  Right RectFemoris Nml Nml Nml Nml Nml Nml Nml Nml Nml Nml Nml Nml N/A  Right GluteusMed Nml Nml Nml Nml Nml Nml Nml Nml Nml Nml Nml Nml N/A  Right BicepsFemS Nml Nml Nml Nml Nml Nml Nml Nml Nml Nml Nml Nml N/A  Right Gastroc Nml Nml Nml Nml Nml Nml Nml Nml Nml Nml Nml Nml N/A  Right Flex Dig Long Nml Nml Nml Nml 1- Rapid Some 1+ Some 1+ Nml Nml N/A  Right Fibularis Long Nml 1+ Nml Nml 1+ Nml Nml SMU None - - - N/A  Right ExtHallLong Nml 1+ Nml Nml  SMU None - - - - - - ATR  Left AntTibialis Nml Nml Nml Nml Nml Nml Nml Nml Nml Nml Nml Nml N/A  Left Gastroc Nml Nml Nml Nml Nml Nml Nml Nml Nml Nml Nml Nml N/A  Left Flex Dig Long Nml Nml Nml Nml 1- Rapid Some 1+ Some 1+ Nml Nml N/A      Waveforms:

## 2018-04-02 ENCOUNTER — Encounter: Payer: Self-pay | Admitting: *Deleted

## 2018-04-11 ENCOUNTER — Encounter: Payer: Self-pay | Admitting: Gynecology

## 2018-04-11 ENCOUNTER — Ambulatory Visit: Payer: BC Managed Care – PPO | Admitting: Gynecology

## 2018-04-11 VITALS — BP 130/80 | Ht 65.0 in | Wt 178.0 lb

## 2018-04-11 DIAGNOSIS — N952 Postmenopausal atrophic vaginitis: Secondary | ICD-10-CM | POA: Diagnosis not present

## 2018-04-11 DIAGNOSIS — Z1151 Encounter for screening for human papillomavirus (HPV): Secondary | ICD-10-CM

## 2018-04-11 DIAGNOSIS — M8580 Other specified disorders of bone density and structure, unspecified site: Secondary | ICD-10-CM

## 2018-04-11 DIAGNOSIS — Z01419 Encounter for gynecological examination (general) (routine) without abnormal findings: Secondary | ICD-10-CM

## 2018-04-11 NOTE — Addendum Note (Signed)
Addended by: Nelva Nay on: 04/11/2018 03:45 PM   Modules accepted: Orders

## 2018-04-11 NOTE — Progress Notes (Signed)
    Rebekah Kennedy Jun 13, 1955 [id number]        62 y.o.  G0P0 for annual gynecologic exam.  Without gynecologic complaints.  Past medical history,surgical history, problem list, medications, allergies, family history and social history were all reviewed and documented as reviewed in the EPIC chart.  ROS:  Performed with pertinent positives and negatives included in the history, assessment and plan.   Additional significant findings : None   Exam: Rebekah Kennedy assistant Vitals:   04/11/18 1504  BP: 130/80  Weight: 178 lb (80.7 kg)  Height: 5\' 5"  (1.651 m)   Body mass index is 29.62 kg/m.  General appearance:  Normal affect, orientation and appearance. Skin: Grossly normal HEENT: Without gross lesions.  No cervical or supraclavicular adenopathy. Thyroid normal.  Lungs:  Clear without wheezing, rales or rhonchi Cardiac: RR, without RMG Abdominal:  Soft, nontender, without masses, guarding, rebound, organomegaly or hernia Breasts:  Examined lying and sitting without masses, retractions, discharge or axillary adenopathy. Pelvic:  Ext, BUS, Vagina: With atrophic changes  Cervix: With atrophic changes.  Pap smear/HPV  Uterus: Anteverted, normal size, shape and contour, midline and mobile nontender   Adnexa: Without masses or tenderness    Anus and perineum: Normal   Rectovaginal: Normal sphincter tone without palpated masses or tenderness.    Assessment/Plan:  62 y.o. G0P0 female for annual gynecologic exam.   1. Postmenopausal/atrophic genital changes.  No significant menopausal symptoms or any vaginal bleeding. 2. Pap smear/HPV 2015.  Pap smear/HPV today.  History of cervical dysplasia with cryosurgery 1990 with normal Pap smears since. 3. Mammography coming due.  I asked her to call [facility] and schedule this.  Had papilloma removed this past year.  Breast exam normal today. 4. Osteopenia.  DEXA 2018 T score -1.5 FRAX 2.6% / 0.1%.  Plan repeat DEXA in several  years. 5. Colonoscopy 2009.  Due for colonoscopy and patient will call to schedule. 6. Health maintenance.  No routine lab work done as patient does this elsewhere.  Follow-up in 1 year, sooner as needed.   Anastasio Auerbach MD, 3:36 PM 04/11/2018

## 2018-04-11 NOTE — Patient Instructions (Signed)
Check with [facility] in reference to scheduling your follow-up mammogram.  Follow-up in 1 year for annual exam.

## 2018-04-15 LAB — PAP IG AND HPV HIGH-RISK: HPV DNA HIGH RISK: NOT DETECTED

## 2018-04-16 ENCOUNTER — Encounter: Payer: Self-pay | Admitting: *Deleted

## 2018-04-24 ENCOUNTER — Other Ambulatory Visit: Payer: Self-pay

## 2018-04-24 ENCOUNTER — Emergency Department (HOSPITAL_COMMUNITY)
Admission: EM | Admit: 2018-04-24 | Discharge: 2018-04-24 | Disposition: A | Discharge status: Home / Self Care | Payer: BC Managed Care – PPO | Attending: Emergency Medicine | Admitting: Emergency Medicine

## 2018-04-24 ENCOUNTER — Encounter (HOSPITAL_COMMUNITY): Payer: Self-pay | Admitting: Emergency Medicine

## 2018-04-24 ENCOUNTER — Emergency Department (HOSPITAL_COMMUNITY): Payer: BC Managed Care – PPO

## 2018-04-24 DIAGNOSIS — Z8673 Personal history of transient ischemic attack (TIA), and cerebral infarction without residual deficits: Secondary | ICD-10-CM | POA: Insufficient documentation

## 2018-04-24 DIAGNOSIS — Z9884 Bariatric surgery status: Secondary | ICD-10-CM | POA: Diagnosis not present

## 2018-04-24 DIAGNOSIS — E119 Type 2 diabetes mellitus without complications: Secondary | ICD-10-CM | POA: Insufficient documentation

## 2018-04-24 DIAGNOSIS — R55 Syncope and collapse: Secondary | ICD-10-CM | POA: Insufficient documentation

## 2018-04-24 DIAGNOSIS — Z7901 Long term (current) use of anticoagulants: Secondary | ICD-10-CM | POA: Insufficient documentation

## 2018-04-24 DIAGNOSIS — I10 Essential (primary) hypertension: Secondary | ICD-10-CM | POA: Diagnosis not present

## 2018-04-24 LAB — CBC WITH DIFFERENTIAL/PLATELET
Abs Immature Granulocytes: 0.03 10*3/uL (ref 0.00–0.07)
BASOS ABS: 0.1 10*3/uL (ref 0.0–0.1)
Basophils Relative: 1 %
Eosinophils Absolute: 0.6 10*3/uL — ABNORMAL HIGH (ref 0.0–0.5)
Eosinophils Relative: 6 %
HCT: 40.7 % (ref 36.0–46.0)
Hemoglobin: 12.4 g/dL (ref 12.0–15.0)
Immature Granulocytes: 0 %
Lymphocytes Relative: 31 %
Lymphs Abs: 3.3 10*3/uL (ref 0.7–4.0)
MCH: 26.6 pg (ref 26.0–34.0)
MCHC: 30.5 g/dL (ref 30.0–36.0)
MCV: 87.2 fL (ref 80.0–100.0)
Monocytes Absolute: 0.6 10*3/uL (ref 0.1–1.0)
Monocytes Relative: 6 %
NRBC: 0 % (ref 0.0–0.2)
Neutro Abs: 5.9 10*3/uL (ref 1.7–7.7)
Neutrophils Relative %: 56 %
Platelets: 303 10*3/uL (ref 150–400)
RBC: 4.67 MIL/uL (ref 3.87–5.11)
RDW: 13.2 % (ref 11.5–15.5)
WBC: 10.5 10*3/uL (ref 4.0–10.5)

## 2018-04-24 LAB — BASIC METABOLIC PANEL
Anion gap: 7 (ref 5–15)
BUN: 17 mg/dL (ref 8–23)
CO2: 29 mmol/L (ref 22–32)
Calcium: 9.4 mg/dL (ref 8.9–10.3)
Chloride: 106 mmol/L (ref 98–111)
Creatinine, Ser: 0.86 mg/dL (ref 0.44–1.00)
GFR calc Af Amer: >60 mL/min (ref >60)
GFR calc non Af Amer: >60 mL/min (ref >60)
Glucose, Bld: 155 mg/dL — ABNORMAL HIGH (ref 70–99)
POTASSIUM: 3.7 mmol/L (ref 3.5–5.1)
Sodium: 142 mmol/L (ref 135–145)
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
## 2018-04-24 NOTE — Discharge Instructions (Signed)
All of your testing today came back normal Please follow up with your PCP Return if worsening

## 2018-04-24 NOTE — ED Notes (Signed)
Patient verbalizes understanding of discharge instructions. Opportunity for questioning and answers were provided. Armband removed by staff, pt discharged from ED ambulatory to home.  
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
## 2018-05-21 DIAGNOSIS — I639 Cerebral infarction, unspecified: Secondary | ICD-10-CM

## 2018-05-21 HISTORY — DX: Cerebral infarction, unspecified: I63.9

## 2018-06-25 LAB — HM DIABETES EYE EXAM

## 2018-07-07 ENCOUNTER — Ambulatory Visit: Payer: Self-pay | Admitting: *Deleted

## 2018-07-07 ENCOUNTER — Emergency Department (HOSPITAL_COMMUNITY): Payer: BC Managed Care – PPO

## 2018-07-07 ENCOUNTER — Encounter (HOSPITAL_COMMUNITY): Payer: Self-pay

## 2018-07-07 ENCOUNTER — Inpatient Hospital Stay (HOSPITAL_COMMUNITY)
Admission: EM | Admit: 2018-07-07 | Discharge: 2018-07-08 | DRG: 065 | Disposition: A | Discharge status: Home Health | Payer: BC Managed Care – PPO | Attending: Internal Medicine | Admitting: Internal Medicine

## 2018-07-07 DIAGNOSIS — F329 Major depressive disorder, single episode, unspecified: Secondary | ICD-10-CM | POA: Diagnosis present

## 2018-07-07 DIAGNOSIS — I69354 Hemiplegia and hemiparesis following cerebral infarction affecting left non-dominant side: Secondary | ICD-10-CM | POA: Diagnosis not present

## 2018-07-07 DIAGNOSIS — E1151 Type 2 diabetes mellitus with diabetic peripheral angiopathy without gangrene: Secondary | ICD-10-CM | POA: Diagnosis present

## 2018-07-07 DIAGNOSIS — I6381 Other cerebral infarction due to occlusion or stenosis of small artery: Secondary | ICD-10-CM | POA: Diagnosis not present

## 2018-07-07 DIAGNOSIS — I672 Cerebral atherosclerosis: Secondary | ICD-10-CM | POA: Diagnosis present

## 2018-07-07 DIAGNOSIS — I152 Hypertension secondary to endocrine disorders: Secondary | ICD-10-CM | POA: Diagnosis present

## 2018-07-07 DIAGNOSIS — M21371 Foot drop, right foot: Secondary | ICD-10-CM | POA: Diagnosis present

## 2018-07-07 DIAGNOSIS — Z9884 Bariatric surgery status: Secondary | ICD-10-CM | POA: Diagnosis not present

## 2018-07-07 DIAGNOSIS — Z8673 Personal history of transient ischemic attack (TIA), and cerebral infarction without residual deficits: Secondary | ICD-10-CM | POA: Diagnosis not present

## 2018-07-07 DIAGNOSIS — R2990 Unspecified symptoms and signs involving the nervous system: Secondary | ICD-10-CM | POA: Diagnosis not present

## 2018-07-07 DIAGNOSIS — W010XXA Fall on same level from slipping, tripping and stumbling without subsequent striking against object, initial encounter: Secondary | ICD-10-CM | POA: Diagnosis present

## 2018-07-07 DIAGNOSIS — R270 Ataxia, unspecified: Secondary | ICD-10-CM | POA: Diagnosis not present

## 2018-07-07 DIAGNOSIS — Z7902 Long term (current) use of antithrombotics/antiplatelets: Secondary | ICD-10-CM | POA: Diagnosis not present

## 2018-07-07 DIAGNOSIS — E785 Hyperlipidemia, unspecified: Secondary | ICD-10-CM | POA: Diagnosis present

## 2018-07-07 DIAGNOSIS — E1142 Type 2 diabetes mellitus with diabetic polyneuropathy: Secondary | ICD-10-CM | POA: Diagnosis present

## 2018-07-07 DIAGNOSIS — E669 Obesity, unspecified: Secondary | ICD-10-CM | POA: Diagnosis present

## 2018-07-07 DIAGNOSIS — R4781 Slurred speech: Secondary | ICD-10-CM | POA: Diagnosis present

## 2018-07-07 DIAGNOSIS — E7849 Other hyperlipidemia: Secondary | ICD-10-CM

## 2018-07-07 DIAGNOSIS — Z8249 Family history of ischemic heart disease and other diseases of the circulatory system: Secondary | ICD-10-CM

## 2018-07-07 DIAGNOSIS — K219 Gastro-esophageal reflux disease without esophagitis: Secondary | ICD-10-CM | POA: Diagnosis present

## 2018-07-07 DIAGNOSIS — E11311 Type 2 diabetes mellitus with unspecified diabetic retinopathy with macular edema: Secondary | ICD-10-CM | POA: Diagnosis present

## 2018-07-07 DIAGNOSIS — Z6828 Body mass index (BMI) 28.0-28.9, adult: Secondary | ICD-10-CM | POA: Diagnosis not present

## 2018-07-07 DIAGNOSIS — G4730 Sleep apnea, unspecified: Secondary | ICD-10-CM | POA: Diagnosis present

## 2018-07-07 DIAGNOSIS — Z7984 Long term (current) use of oral hypoglycemic drugs: Secondary | ICD-10-CM

## 2018-07-07 DIAGNOSIS — E1159 Type 2 diabetes mellitus with other circulatory complications: Secondary | ICD-10-CM

## 2018-07-07 DIAGNOSIS — Z79899 Other long term (current) drug therapy: Secondary | ICD-10-CM | POA: Diagnosis not present

## 2018-07-07 DIAGNOSIS — R260 Ataxic gait: Secondary | ICD-10-CM | POA: Diagnosis present

## 2018-07-07 DIAGNOSIS — Z888 Allergy status to other drugs, medicaments and biological substances status: Secondary | ICD-10-CM | POA: Diagnosis not present

## 2018-07-07 DIAGNOSIS — G4733 Obstructive sleep apnea (adult) (pediatric): Secondary | ICD-10-CM | POA: Diagnosis present

## 2018-07-07 DIAGNOSIS — Z833 Family history of diabetes mellitus: Secondary | ICD-10-CM | POA: Diagnosis not present

## 2018-07-07 DIAGNOSIS — I10 Essential (primary) hypertension: Secondary | ICD-10-CM | POA: Diagnosis present

## 2018-07-07 DIAGNOSIS — Z794 Long term (current) use of insulin: Secondary | ICD-10-CM | POA: Diagnosis not present

## 2018-07-07 DIAGNOSIS — E1169 Type 2 diabetes mellitus with other specified complication: Secondary | ICD-10-CM | POA: Diagnosis present

## 2018-07-07 DIAGNOSIS — E119 Type 2 diabetes mellitus without complications: Secondary | ICD-10-CM

## 2018-07-07 LAB — GLUCOSE, CAPILLARY
Glucose-Capillary: 135 mg/dL — ABNORMAL HIGH (ref 70–99)
Glucose-Capillary: 224 mg/dL — ABNORMAL HIGH (ref 70–99)

## 2018-07-07 LAB — CBG MONITORING, ED: Glucose-Capillary: 270 mg/dL — ABNORMAL HIGH (ref 70–99)

## 2018-07-07 LAB — DIFFERENTIAL
Abs Immature Granulocytes: 0.03 10*3/uL (ref 0.00–0.07)
Basophils Absolute: 0.1 10*3/uL (ref 0.0–0.1)
Basophils Relative: 1 %
EOS ABS: 0.3 10*3/uL (ref 0.0–0.5)
EOS PCT: 2 %
Immature Granulocytes: 0 %
LYMPHS ABS: 3.1 10*3/uL (ref 0.7–4.0)
Lymphocytes Relative: 30 %
Monocytes Absolute: 0.5 10*3/uL (ref 0.1–1.0)
Monocytes Relative: 5 %
Neutro Abs: 6.5 10*3/uL (ref 1.7–7.7)
Neutrophils Relative %: 62 %

## 2018-07-07 LAB — COMPREHENSIVE METABOLIC PANEL
ALT: 36 U/L (ref 0–44)
AST: 21 U/L (ref 15–41)
Albumin: 4.1 g/dL (ref 3.5–5.0)
Alkaline Phosphatase: 101 U/L (ref 38–126)
Anion gap: 9 (ref 5–15)
BILIRUBIN TOTAL: 0.9 mg/dL (ref 0.3–1.2)
BUN: 17 mg/dL (ref 8–23)
CALCIUM: 9.4 mg/dL (ref 8.9–10.3)
CO2: 29 mmol/L (ref 22–32)
Chloride: 101 mmol/L (ref 98–111)
Creatinine, Ser: 0.75 mg/dL (ref 0.44–1.00)
GFR calc Af Amer: >60 mL/min (ref >60)
GFR calc non Af Amer: >60 mL/min (ref >60)
Glucose, Bld: 261 mg/dL — ABNORMAL HIGH (ref 70–99)
POTASSIUM: 4.2 mmol/L (ref 3.5–5.1)
Sodium: 139 mmol/L (ref 135–145)
TOTAL PROTEIN: 7.6 g/dL (ref 6.5–8.1)

## 2018-07-07 LAB — CBC
HCT: 42.1 % (ref 36.0–46.0)
Hemoglobin: 13.3 g/dL (ref 12.0–15.0)
MCH: 28.1 pg (ref 26.0–34.0)
MCHC: 31.6 g/dL (ref 30.0–36.0)
MCV: 88.8 fL (ref 80.0–100.0)
Platelets: 302 10*3/uL (ref 150–400)
RBC: 4.74 MIL/uL (ref 3.87–5.11)
RDW: 12.1 % (ref 11.5–15.5)
WBC: 10.5 10*3/uL (ref 4.0–10.5)
nRBC: 0 % (ref 0.0–0.2)

## 2018-07-07 LAB — ETHANOL: Alcohol, Ethyl (B): <10 mg/dL (ref <10)

## 2018-07-07 LAB — APTT: APTT: 30 s (ref 24–36)

## 2018-07-07 LAB — PROTIME-INR
INR: 0.96
Prothrombin Time: 12.7 seconds (ref 11.4–15.2)

## 2018-07-07 MED ORDER — PANTOPRAZOLE SODIUM 40 MG PO TBEC
40.0000 mg | DELAYED_RELEASE_TABLET | Freq: Every day | ORAL | Status: DC
  Administered 2018-07-08: 40 mg via ORAL
  Filled 2018-07-07: qty 1

## 2018-07-07 MED ORDER — ASPIRIN 325 MG PO TABS: 325.0000 mg | ORAL_TABLET | Freq: Every day | ORAL | Status: DC

## 2018-07-07 MED ORDER — MONTELUKAST SODIUM 10 MG PO TABS
10.0000 mg | ORAL_TABLET | Freq: Every day | ORAL | Status: DC
  Administered 2018-07-08: 10 mg via ORAL
  Filled 2018-07-07: qty 1

## 2018-07-07 MED ORDER — ASPIRIN 300 MG RE SUPP: 300.0000 mg | Freq: Every day | RECTAL | Status: DC

## 2018-07-07 MED ORDER — ROSUVASTATIN CALCIUM 5 MG PO TABS
10.0000 mg | ORAL_TABLET | Freq: Every day | ORAL | Status: DC
  Administered 2018-07-08: 10 mg via ORAL
  Filled 2018-07-07: qty 2

## 2018-07-07 MED ORDER — INSULIN ASPART 100 UNIT/ML ~~LOC~~ SOLN: 0.0000 [IU] | Freq: Every day | SUBCUTANEOUS | Status: DC

## 2018-07-07 MED ORDER — ASPIRIN EC 81 MG PO TBEC
81.0000 mg | DELAYED_RELEASE_TABLET | Freq: Every day | ORAL | Status: DC
  Administered 2018-07-08: 81 mg via ORAL
  Filled 2018-07-07: qty 1

## 2018-07-07 MED ORDER — SODIUM CHLORIDE 0.9% FLUSH: 3.0000 mL | Freq: Once | INTRAVENOUS | Status: DC

## 2018-07-07 MED ORDER — ACETAMINOPHEN 325 MG PO TABS: 650.0000 mg | ORAL_TABLET | Freq: Four times a day (QID) | ORAL | Status: DC | PRN

## 2018-07-07 MED ORDER — INSULIN ASPART 100 UNIT/ML ~~LOC~~ SOLN
0.0000 [IU] | Freq: Three times a day (TID) | SUBCUTANEOUS | Status: DC
  Administered 2018-07-07: 1 [IU] via SUBCUTANEOUS
  Administered 2018-07-08: 3 [IU] via SUBCUTANEOUS
  Administered 2018-07-08: 2 [IU] via SUBCUTANEOUS

## 2018-07-07 MED ORDER — CLOPIDOGREL BISULFATE 75 MG PO TABS
75.0000 mg | ORAL_TABLET | Freq: Every day | ORAL | Status: DC
  Administered 2018-07-08: 75 mg via ORAL
  Filled 2018-07-07: qty 1

## 2018-07-07 MED ORDER — STROKE: EARLY STAGES OF RECOVERY BOOK
Freq: Once | Status: DC
  Filled 2018-07-07: qty 1

## 2018-07-07 MED ORDER — ENOXAPARIN SODIUM 40 MG/0.4ML ~~LOC~~ SOLN
40.0000 mg | SUBCUTANEOUS | Status: DC
  Administered 2018-07-07: 40 mg via SUBCUTANEOUS
  Filled 2018-07-07: qty 0.4

## 2018-07-07 MED ORDER — LORAZEPAM 2 MG/ML IJ SOLN
0.5000 mg | Freq: Once | INTRAMUSCULAR | Status: AC | PRN
  Administered 2018-07-07: 0.5 mg via INTRAVENOUS
  Filled 2018-07-07: qty 1

## 2018-07-07 MED ORDER — ASPIRIN 325 MG PO TABS
325.0000 mg | ORAL_TABLET | ORAL | Status: AC
  Administered 2018-07-07: 325 mg via ORAL
  Filled 2018-07-07: qty 1

## 2018-07-07 MED ORDER — ALBUTEROL SULFATE (2.5 MG/3ML) 0.083% IN NEBU: 3.0000 mL | INHALATION_SOLUTION | RESPIRATORY_TRACT | Status: DC | PRN

## 2018-07-07 MED ORDER — DIAZEPAM 5 MG/ML IJ SOLN
2.5000 mg | Freq: Once | INTRAMUSCULAR | Status: AC
  Administered 2018-07-07: 2.5 mg via INTRAVENOUS
  Filled 2018-07-07: qty 2

## 2018-07-07 MED ORDER — ADULT MULTIVITAMIN W/MINERALS CH
1.0000 | ORAL_TABLET | Freq: Every day | ORAL | Status: DC
  Administered 2018-07-08: 1 via ORAL
  Filled 2018-07-07: qty 1

## 2018-07-07 MED ORDER — ACETAMINOPHEN 650 MG RE SUPP: 650.0000 mg | Freq: Four times a day (QID) | RECTAL | Status: DC | PRN

## 2018-07-07 NOTE — Telephone Encounter (Signed)
Just an FYI so you are up to date.

## 2018-07-07 NOTE — H&P (Signed)
History and Physical    Rebekah Kennedy HQP:[id number] DOB: [birth year] DOA: 07/07/2018  PCP: Binnie Rail, MD  Patient coming from: home  I have personally briefly reviewed patient's old medical records in [place]  Chief Complaint: dizziness, fall  HPI: Rebekah Kennedy is Rebekah Kennedy 63 y.o. female with medical history significant of stroke and TIAs, type 2 diabetes, hypertension, asthma, depression and multiple other medical problems presenting with new onset dizziness and difficulty walking with Rebekah Kennedy fall.   She notes that her symptoms started this morning.  Yesterday she was at her baseline.  This morning when she woke up she was stumbling felt dizzy and when she got to work she fell.  She notes that she has peripheral neuropathy from her diabetes and patient occasionally trips over her feet.   She denies any slurred speech or facial droop.  She denies any fevers, chills, cough, chest pain, shortness of breath.  She notes chronic peripheral neuropathy.  She denies smoking, drinking or other drugs.    ED Course: Labs, EKG, head CT, MRI.  Discussed with neuro.  Based on abnormal exam concern for stroke.  Admit to hospitalist to [facility]  Review of Systems: As per HPI otherwise 10 point review of systems negative.   Past Medical History:  Diagnosis Date  . ASTHMA NOS W/ACUTE EXACERBATION 07/10/2010  . Cervical dysplasia   . DEPRESSION 03/16/2007  . DIABETES MELLITUS, TYPE II 12/01/2006  . Dysmenorrhea   . Fibroid   . HYPERLIPIDEMIA 12/01/2006  . HYPERTENSION 07/07/2006  . Neuropathy   . Obesity   . Osteopenia 05/2017   T score -1.5 FRAX 2.6% / 0.1%  . Retinal edema    gets steriod inj in the eyes    . Sleep apnea    HAS CPAP BUT ADMITS DOES NOT USE JUDICIALLY   . Splenomegaly    in college  . 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 TIA (transient ischemic attack) 2012   2 TIAs 1 week apart of each other     Past Surgical History:  Procedure Laterality  Date  . 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 APPENDECTOMY    . BIOPSY THYROID  05/02/11   Nonneoplastic goiter  . BREAST BIOPSY    . BREAST LUMPECTOMY WITH RADIOACTIVE SEED LOCALIZATION Left 07/25/2017   Procedure: LEFT BREAST LUMPECTOMY WITH RADIOACTIVE SEED LOCALIZATION;  Surgeon: Coralie Keens, MD;  Location: [place];  Service: General;  Laterality: Left;  . BREAST SURGERY     Reduction  . COLPOSCOPY    . DILATION AND CURETTAGE OF UTERUS  1975   DUB  . EYE SURGERY     Laser  . GASTRIC ROUX-EN-Y N/Rebekah Kennedy 08/12/2017   Procedure: LAPAROSCOPIC ROUX-EN-Y GASTRIC BYPASS WITH HIATAL HERNIA REPAIR AND UPPER ENDOSCOPY;  Surgeon: [name], [name], MD;  Location: [facility];  Service: General;  Laterality: N/Rebekah Kennedy;  . GYNECOLOGIC CRYOSURGERY    . MYOMECTOMY    . OVARIAN CYST REMOVAL    . PELVIC LAPAROSCOPY  75,88   DL lysis of adhesions     reports that she has never smoked. She has never used smokeless tobacco. She reports current alcohol use. She reports that she does not use drugs.  Allergies  Allergen Reactions  . Quinapril Cough    Family History  Problem Relation Age of Onset  . Cancer Maternal Grandmother        Colon Cancer  . Asthma Maternal Grandmother   .  Diabetes Father   . Heart disease Father   . Hypertension Father   . Hyperlipidemia Father   . Hypertension Mother   . Heart disease Mother   . Ovarian cancer Mother   . Cancer Mother        Lung cancer  . Asthma Mother   . COPD Mother   . Hyperlipidemia Mother   . Diabetes Brother   . Hypertension Brother   . Kidney disease Brother   . Asthma Brother   . Heart disease Brother   . Hyperlipidemia Brother   . Cancer Brother        Prostate  . Graves' disease Sister   . Diabetes Sister   . Breast cancer Sister 4  . Graves' disease Paternal Grandmother   . Hypertension Paternal Grandmother   . Heart disease Paternal Grandmother   . Alzheimer's disease Paternal Grandmother   . Cancer Maternal Grandfather   . Heart  failure Brother   . Heart failure Brother    Prior to Admission medications   Medication Sig Start Date End Date Taking? Authorizing Provider  albuterol (PROVENTIL HFA;VENTOLIN HFA) 108 (90 Base) MCG/ACT inhaler Inhale 2 puffs into the lungs every 4 (four) hours as needed for wheezing or shortness of breath. 07/23/17  Yes Burns, Claudina Lick, MD  amLODipine (NORVASC) 5 MG tablet Take 1 tablet (5 mg total) by mouth daily. 10/09/17  Yes Burns, Claudina Lick, MD  Biotin 5000 MCG TABS Take 5,000 mcg by mouth daily.    Yes [provider]  clopidogrel (PLAVIX) 75 MG tablet Take 1 tablet (75 mg total) by mouth daily. 10/09/17  Yes Burns, Claudina Lick, MD  losartan (COZAAR) 100 MG tablet TAKE 1 TAB BY MOUTH ONCE DAILY. Patient taking differently: Take 100 mg by mouth daily.  08/16/17  Yes Burns, Claudina Lick, MD  metoprolol succinate (TOPROL-XL) 100 MG 24 hr tablet TAKE 1 TABLET BY MOUTH DAILY WITH OR IMMEDIATELY FOLLOWING Rebekah Kennedy MEAL Patient taking differently: Take 100 mg by mouth daily.  01/31/17  Yes Burns, Claudina Lick, MD  montelukast (SINGULAIR) 10 MG tablet Take 1 tablet (10 mg total) by mouth at bedtime. Patient taking differently: Take 10 mg by mouth daily.  01/15/17  Yes Burns, Claudina Lick, MD  OVER THE COUNTER MEDICATION Take 1 tablet by mouth daily. BariMelt   Yes [provider]  pantoprazole (PROTONIX) 40 MG tablet Take 40 mg by mouth daily. 07/19/17  Yes [provider]  rosuvastatin (CRESTOR) 10 MG tablet Take 1 tablet (10 mg total) by mouth daily. 10/09/17  Yes Burns, Claudina Lick, MD  sitaGLIPtin (JANUVIA) 100 MG tablet Take 1 tablet (100 mg total) by mouth daily. 08/29/17  Yes Renato Shin, MD  spironolactone (ALDACTONE) 25 MG tablet Take 1 tablet (25 mg total) by mouth daily. 08/19/17  Yes Burns, Claudina Lick, MD  polyethylene glycol (MIRALAX / GLYCOLAX) packet Take 17 g by mouth daily. Patient not taking: Reported on 07/07/2018 09/28/17   Thurnell Lose, MD    Physical Exam: Vitals:   07/07/18 0938  07/07/18 0939  BP:  (!) 179/77  Pulse:  68  Resp:  15  Temp:  97.7 F (36.5 C)  TempSrc:  Oral  SpO2:  100%  Weight: 79.4 kg   Height: 5' 5.75" (1.67 m)     Constitutional: NAD, calm, comfortable Vitals:   07/07/18 0938 07/07/18 0939  BP:  (!) 179/77  Pulse:  68  Resp:  15  Temp:  97.7 F (36.5 C)  TempSrc:  Oral  SpO2:  100%  Weight: 79.4 kg   Height: 5' 5.75" (1.67 m)    Eyes: PERRL, lids and conjunctivae normal ENMT: Mucous membranes are moist. Posterior pharynx clear of any exudate or lesions.Normal dentition.  Neck: normal, supple, no masses, no thyromegaly Respiratory: clear to auscultation bilaterally, no wheezing, no crackles. Normal respiratory effort. No accessory muscle use.  Cardiovascular: Regular rate and rhythm, no murmurs / rubs / gallops. No extremity edema.  Abdomen: no tenderness, no masses palpated. No hepatosplenomegaly. Bowel sounds positive.  Musculoskeletal: no clubbing / cyanosis. No joint deformity upper and lower extremities. Good ROM, no contractures. Normal muscle tone.  Skin: no rashes, lesions, ulcers. No induration Neurologic: CN 2-12 intact. Sensation intact. Strength 5/5 in all 4.  Dysmetria with FNF to R.  Normal heel to shin bilaterally.  Did not walk pt, but reported ataxia by EDP. Psychiatric: Normal judgment and insight. Alert and oriented x 3. Normal mood.   Labs on Admission: I have personally reviewed following labs and imaging studies  CBC: Recent Labs  Lab 07/07/18 1040  WBC 10.5  NEUTROABS 6.5  HGB 13.3  HCT 42.1  MCV 88.8  PLT 621   Basic Metabolic Panel: Recent Labs  Lab 07/07/18 1040  NA 139  K 4.2  CL 101  CO2 29  GLUCOSE 261*  BUN 17  CREATININE 0.75  CALCIUM 9.4   GFR: Estimated Creatinine Clearance: 78.1 mL/min (by C-G formula based on SCr of 0.75 mg/dL). Liver Function Tests: Recent Labs  Lab 07/07/18 1040  AST 21  ALT 36  ALKPHOS 101  BILITOT 0.9  PROT 7.6  ALBUMIN 4.1   No results for  input(s): LIPASE, AMYLASE in the last 168 hours. No results for input(s): AMMONIA in the last 168 hours. Coagulation Profile: Recent Labs  Lab 07/07/18 1040  INR 0.96   Cardiac Enzymes: No results for input(s): CKTOTAL, CKMB, CKMBINDEX, TROPONINI in the last 168 hours. BNP (last 3 results) No results for input(s): PROBNP in the last 8760 hours. HbA1C: No results for input(s): HGBA1C in the last 72 hours. CBG: Recent Labs  Lab 07/07/18 1010  GLUCAP 270*   Lipid Profile: No results for input(s): CHOL, HDL, LDLCALC, TRIG, CHOLHDL, LDLDIRECT in the last 72 hours. Thyroid Function Tests: No results for input(s): TSH, T4TOTAL, FREET4, T3FREE, THYROIDAB in the last 72 hours. Anemia Panel: No results for input(s): VITAMINB12, FOLATE, FERRITIN, TIBC, IRON, RETICCTPCT in the last 72 hours. Urine analysis:    Component Value Date/Time   COLORURINE YELLOW 09/22/2017 2243   APPEARANCEUR HAZY (Yaxiel Minnie) 09/22/2017 2243   LABSPEC 1.016 09/22/2017 2243   PHURINE 7.0 09/22/2017 2243   GLUCOSEU NEGATIVE 09/22/2017 2243   GLUCOSEU >=1000 (Novice Vrba) 06/21/2014 1549   HGBUR SMALL (Rajon Bisig) 09/22/2017 2243   BILIRUBINUR NEGATIVE 09/22/2017 2243   BILIRUBINUR small 03/02/2014 1703   KETONESUR 80 (Filomeno Cromley) 09/22/2017 2243   PROTEINUR 100 (Didi Ganaway) 09/22/2017 2243   UROBILINOGEN 0.2 06/21/2014 1549   NITRITE NEGATIVE 09/22/2017 2243   LEUKOCYTESUR LARGE (Vickye Astorino) 09/22/2017 2243    Radiological Exams on Admission: Ct Head Wo Contrast  Result Date: 07/07/2018 CLINICAL DATA:  Dizziness this morning. Fell at home. Patient is on Plavix. EXAM: CT HEAD WITHOUT CONTRAST TECHNIQUE: Contiguous axial images were obtained from the base of the skull through the vertex without intravenous contrast. COMPARISON:  Head CT, 04/24/2018 FINDINGS: Brain: No evidence of acute infarction, hemorrhage, hydrocephalus, extra-axial collection or mass lesion/mass effect. Vascular: No hyperdense vessel or unexpected calcification.  Skull: Normal. Negative for  fracture or focal lesion. Sinuses/Orbits: Globes and orbits are unremarkable. The visualized sinuses and mastoid air cells are clear. Other: None. IMPRESSION: Normal unenhanced CT scan of the brain. Electronically Signed   By: Lajean Manes M.D.   On: 07/07/2018 11:32    EKG: Independently reviewed. Sinus rhythm, appears similar to priors  Assessment/Plan Active Problems:   Stroke-like symptoms   Stroke Like Symptoms:  Pt woke up dizziness, fell today.  She has dysmetria to her RUE on exam.  Discussed with neurology by EDP who recommending MRI and admission to [facility].  She has hx TIA x 2 and R pontine stroke, on plavix pta. Full dose ASA today x1 Continue PTA plavix and continue asa 81 mg tomorrow MRI/MRA pending Carotid dopplers Echo PT/OT/SLP A1c, lipids Continue pta crestor, dose may need to be adjusted Permissive hypertension Neuro c/s, appreciate recs  Asthma: continue home nebs, singulair  T2DM: hold januvia, start SSI  Hypertension: holding home meds, permissive HTN  GERD: continue PPI  Hx bariatric surgery  roux en y: continue MVI (substituted for home mvi)  DVT prophylaxis: lovenox  Code Status: full  Family Communication: none at bedside Disposition Plan: pending completion of stroke w/u Consults called: neurology  Admission status: observation    Fayrene Helper MD [facility] Pager AMION  If 7PM-7AM, please contact night-coverage [email or web address] Password [facility]  07/07/2018, 1:29 PM

## 2018-07-07 NOTE — ED Notes (Signed)
ED TO INPATIENT HANDOFF REPORT  Name/Age/Gender Rebekah Kennedy 63 y.o. female  Code Status Code Status History    Date Active Date Inactive Code Status Order ID Comments User Context   09/23/2017 0006 09/28/2017 1758 Full Code [id number]  [name], MD ED   08/12/2017 1154 08/14/2017 1553 Full Code [id number]  [name], [name], MD Inpatient   05/23/2017 1423 05/25/2017 1547 Full Code [id number]  [name], MD Inpatient      Home/SNF/Other Home  Chief Complaint fall;dizziness  Level of Care/Admitting Diagnosis ED Disposition    ED Disposition Condition [place]: [place] [100100]  Level of Care: Medical Telemetry [104]  I expect the patient will be discharged within 24 hours: Yes  LOW acuity---Tx typically complete <24 hrs---ACUTE conditions typically can be evaluated <24 hours---LABS likely to return to acceptable levels <24 hours---IS near functional baseline---EXPECTED to return to current living arrangement---NOT newly hypoxic: Does not meet criteria for 5C-Observation unit  Diagnosis: Stroke-like symptoms [505397]  Admitting Physician: Elodia Florence [phone]  Attending Physician: Cephus Slater, A CALDWELL [phone]  PT Class (Do Not Modify): Observation [104]  PT Acc Code (Do Not Modify): Observation [10022]       Medical History Past Medical History:  Diagnosis Date  . ASTHMA NOS W/ACUTE EXACERBATION 07/10/2010  . Cervical dysplasia   . DEPRESSION 03/16/2007  . DIABETES MELLITUS, TYPE II 12/01/2006  . Dysmenorrhea   . Fibroid   . HYPERLIPIDEMIA 12/01/2006  . HYPERTENSION 07/07/2006  . Neuropathy   . Obesity   . Osteopenia 05/2017   T score -1.5 FRAX 2.6% / 0.1%  . Retinal edema    gets steriod inj in the eyes    . Sleep apnea    HAS CPAP BUT ADMITS DOES NOT USE JUDICIALLY   . Splenomegaly    in college  . 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 TIA (transient ischemic attack)  2012   2 TIAs 1 week apart of each other     Allergies Allergies  Allergen Reactions  . Quinapril Cough    IV Location/Drains/Wounds Patient Lines/Drains/Airways Status   Active Line/Drains/Airways    Name:   Placement date:   Placement time:   Site:   Days:   Peripheral IV 07/07/18 Left Forearm   07/07/18    1050    Forearm   less than 1   Incision (Closed) 07/25/17 Breast Left   07/25/17    0754     347   Incision (Closed) 08/12/17 Abdomen Other (Comment)   08/12/17    0916     329   Incision - 6 Ports Abdomen Right;Lateral Right;Medial Left;Umbilicus Mid;Upper Left Left;Lateral   08/12/17    0804     329          Labs/Imaging Results for orders placed or performed during the hospital encounter of 07/07/18 (from the past 48 hour(s))  CBG monitoring, ED     Status: Abnormal   Collection Time: 07/07/18 10:10 AM  Result Value Ref Range   Glucose-Capillary 270 (H) 70 - 99 mg/dL  Ethanol     Status: None   Collection Time: 07/07/18 10:40 AM  Result Value Ref Range   Alcohol, Ethyl (B) <10 <10 mg/dL    Comment: (NOTE) Lowest detectable limit for serum alcohol is 10 mg/dL. For medical purposes only. Performed at [facility], [address] [place],  [place] [address]   Protime-INR     Status: None   Collection Time: 07/07/18 10:40 AM  Result Value Ref Range   Prothrombin Time 12.7 11.4 - 15.2 seconds   INR 0.96     Comment: Performed at [facility], [place] [address] [place], [place] [address]  APTT     Status: None   Collection Time: 07/07/18 10:40 AM  Result Value Ref Range   aPTT 30 24 - 36 seconds    Comment: Performed at [facility], [place] [address] [place], [place] [address]  Differential     Status: None   Collection Time: 07/07/18 10:40 AM  Result Value Ref Range   Neutrophils Relative % 62 %   Neutro Abs 6.5 1.7 - 7.7 K/uL   Lymphocytes Relative 30 %   Lymphs Abs 3.1 0.7 - 4.0 K/uL   Monocytes  Relative 5 %   Monocytes Absolute 0.5 0.1 - 1.0 K/uL   Eosinophils Relative 2 %   Eosinophils Absolute 0.3 0.0 - 0.5 K/uL   Basophils Relative 1 %   Basophils Absolute 0.1 0.0 - 0.1 K/uL   Immature Granulocytes 0 %   Abs Immature Granulocytes 0.03 0.00 - 0.07 K/uL    Comment: Performed at [facility], [place] [address] [place], [place] [address]  Comprehensive metabolic panel     Status: Abnormal   Collection Time: 07/07/18 10:40 AM  Result Value Ref Range   Sodium 139 135 - 145 mmol/L   Potassium 4.2 3.5 - 5.1 mmol/L   Chloride 101 98 - 111 mmol/L   CO2 29 22 - 32 mmol/L   Glucose, Bld 261 (H) 70 - 99 mg/dL   BUN 17 8 - 23 mg/dL   Creatinine, Ser 0.75 0.44 - 1.00 mg/dL   Calcium 9.4 8.9 - 10.3 mg/dL   Total Protein 7.6 6.5 - 8.1 g/dL   Albumin 4.1 3.5 - 5.0 g/dL   AST 21 15 - 41 U/L   ALT 36 0 - 44 U/L   Alkaline Phosphatase 101 38 - 126 U/L   Total Bilirubin 0.9 0.3 - 1.2 mg/dL   GFR calc non Af Amer >60 >60 mL/min   GFR calc Af Amer >60 >60 mL/min   Anion gap 9 5 - 15    Comment: Performed at [facility], [place] [address], [place], [place] [address]  CBC     Status: None   Collection Time: 07/07/18 10:40 AM  Result Value Ref Range   WBC 10.5 4.0 - 10.5 K/uL   RBC 4.74 3.87 - 5.11 MIL/uL   Hemoglobin 13.3 12.0 - 15.0 g/dL   HCT 42.1 36.0 - 46.0 %   MCV 88.8 80.0 - 100.0 fL   MCH 28.1 26.0 - 34.0 pg   MCHC 31.6 30.0 - 36.0 g/dL   RDW 12.1 11.5 - 15.5 %   Platelets 302 150 - 400 K/uL   nRBC 0.0 0.0 - 0.2 %    Comment: Performed at [facility], [place] [address], [place], [place] [address]   Ct Head Wo Contrast  Result Date: 07/07/2018 CLINICAL DATA:  Dizziness this morning. Fell at home. Patient is on Plavix. EXAM: CT HEAD WITHOUT CONTRAST TECHNIQUE: Contiguous axial images were obtained from the base of the skull through the vertex without intravenous contrast. COMPARISON:  Head CT, 04/24/2018 FINDINGS: Brain: No  evidence of acute infarction, hemorrhage, hydrocephalus, extra-axial collection or mass lesion/mass effect. Vascular: No hyperdense  vessel or unexpected calcification. Skull: Normal. Negative for fracture or focal lesion. Sinuses/Orbits: Globes and orbits are unremarkable. The visualized sinuses and mastoid air cells are clear. Other: None. IMPRESSION: Normal unenhanced CT scan of the brain. Electronically Signed   By: Lajean Manes M.D.   On: 07/07/2018 11:32   Mr Jodene Nam Head Wo Contrast  Result Date: 07/07/2018 CLINICAL DATA:  Focal neuro deficit greater than 6 hours. Dizziness. Fall. EXAM: MRI HEAD WITHOUT CONTRAST MRA HEAD WITHOUT CONTRAST TECHNIQUE: Multiplanar, multiecho pulse sequences of the brain and surrounding structures were obtained without intravenous contrast. Angiographic images of the head were obtained using MRA technique without contrast. COMPARISON:  CT head 07/07/2018 FINDINGS: MRI HEAD FINDINGS Brain: Acute infarct in the left anterior thalamus measuring approximately 1 cm. No other acute infarct. Ventricle size normal. Mild chronic microvascular ischemic changes in the white matter. Negative for hemorrhage mass or fluid collection. Vascular: Normal arterial flow voids Skull and upper cervical spine: Negative Sinuses/Orbits: Paranasal sinuses clear. Bilateral cataract surgery. Other: None MRA HEAD FINDINGS Both vertebral arteries widely patent. PICA patent bilaterally. Basilar widely patent. Superior cerebellar and posterior cerebral arteries patent bilaterally. Fetal origin left posterior cerebral artery. Internal carotid artery patent bilaterally with mild stenosis on the right due to atherosclerotic irregularity. Anterior and middle cerebral arteries patent bilaterally. Mild to moderate stenosis proximal left A1 segment. Mild to moderate stenosis proximal right M1 segment. Mild stenosis left M1 segment. No large vessel occlusion. Negative for aneurysm. IMPRESSION: Small area of acute  infarction left anterior thalamus. Mild chronic microvascular ischemic changes in the white matter Intracranial atherosclerotic disease as above. Negative for emergent large vessel occlusion. Electronically Signed   By: Franchot Gallo M.D.   On: 07/07/2018 13:36   Mr Brain Wo Contrast  Result Date: 07/07/2018 CLINICAL DATA:  Focal neuro deficit greater than 6 hours. Dizziness. Fall. EXAM: MRI HEAD WITHOUT CONTRAST MRA HEAD WITHOUT CONTRAST TECHNIQUE: Multiplanar, multiecho pulse sequences of the brain and surrounding structures were obtained without intravenous contrast. Angiographic images of the head were obtained using MRA technique without contrast. COMPARISON:  CT head 07/07/2018 FINDINGS: MRI HEAD FINDINGS Brain: Acute infarct in the left anterior thalamus measuring approximately 1 cm. No other acute infarct. Ventricle size normal. Mild chronic microvascular ischemic changes in the white matter. Negative for hemorrhage mass or fluid collection. Vascular: Normal arterial flow voids Skull and upper cervical spine: Negative Sinuses/Orbits: Paranasal sinuses clear. Bilateral cataract surgery. Other: None MRA HEAD FINDINGS Both vertebral arteries widely patent. PICA patent bilaterally. Basilar widely patent. Superior cerebellar and posterior cerebral arteries patent bilaterally. Fetal origin left posterior cerebral artery. Internal carotid artery patent bilaterally with mild stenosis on the right due to atherosclerotic irregularity. Anterior and middle cerebral arteries patent bilaterally. Mild to moderate stenosis proximal left A1 segment. Mild to moderate stenosis proximal right M1 segment. Mild stenosis left M1 segment. No large vessel occlusion. Negative for aneurysm. IMPRESSION: Small area of acute infarction left anterior thalamus. Mild chronic microvascular ischemic changes in the white matter Intracranial atherosclerotic disease as above. Negative for emergent large vessel occlusion. Electronically  Signed   By: Franchot Gallo M.D.   On: 07/07/2018 13:36   EKG Interpretation  Date/Time:  Monday July 07 2018 09:38:29 EST Ventricular Rate:  67 PR Interval:    QRS Duration: 95 QT Interval:  397 QTC Calculation: 420 R Axis:   62 Text Interpretation:  Sinus rhythm Probable anteroseptal infarct, recent No significant change since last tracing Confirmed by Deno Etienne [phone]) on  07/07/2018 9:55:00 AM   Pending Labs Unresulted Labs (From admission, onward)    Start     Ordered   07/08/18 0500  Hemoglobin A1c  Tomorrow morning,   R     07/07/18 1220   07/08/18 0500  Lipid panel  Tomorrow morning,   R    Comments:  Fasting    07/07/18 1220   07/07/18 1024  Urine rapid drug screen (hosp performed)  ONCE - STAT,   STAT     07/07/18 1025   07/07/18 0939  Urinalysis, Routine w reflex microscopic  Once,   STAT     07/07/18 0939   Signed and Held  HIV antibody (Routine Testing)  Once,   R     Signed and Held   Signed and Held  CBC  (enoxaparin (LOVENOX)    CrCl >/= 30 ml/min)  Once,   R    Comments:  Baseline for enoxaparin therapy IF NOT ALREADY DRAWN.  Notify MD if PLT < 100 K.    Signed and Held   Signed and Held  Creatinine, serum  (enoxaparin (LOVENOX)    CrCl >/= 30 ml/min)  Once,   R    Comments:  Baseline for enoxaparin therapy IF NOT ALREADY DRAWN.    Signed and Held   Signed and Held  Creatinine, serum  (enoxaparin (LOVENOX)    CrCl >/= 30 ml/min)  Weekly,   R    Comments:  while on enoxaparin therapy    Signed and Held   Signed and Held  CBC  Tomorrow morning,   R     Signed and Held   Signed and Held  Comprehensive metabolic panel  Tomorrow morning,   R     Signed and Held          Vitals/Pain Today's Vitals   07/07/18 0938 07/07/18 0939  BP:  (!) 179/77  Pulse:  68  Resp:  15  Temp:  97.7 F (36.5 C)  TempSrc:  Oral  SpO2:  100%  Weight: 79.4 kg   Height: 5' 5.75" (1.67 m)   PainSc: 0-No pain     Isolation Precautions No active  isolations  Medications Medications  sodium chloride flush (NS) 0.9 % injection 3 mL (has no administration in time range)   stroke: mapping our early stages of recovery book (has no administration in time range)  aspirin EC tablet 81 mg (has no administration in time range)  diazepam (VALIUM) injection 2.5 mg (2.5 mg Intravenous Given 07/07/18 1101)  LORazepam (ATIVAN) injection 0.5 mg (0.5 mg Intravenous Given 07/07/18 1236)  aspirin tablet 325 mg (325 mg Oral Given 07/07/18 1345)    Mobility walks with device

## 2018-07-07 NOTE — Telephone Encounter (Signed)
Pt called with having dizziness this morning. she fell at home.  Dizziness is worst when she is trying to get up or change her head position. She has foot drop. She drove to work and had to have help getting out of the car this morning. She has a hx of HTN, diabetes and is on Plavix.   She did not check her blood sugar or her b/p this morning. Denies nausea, shortness of breath, bleeding or chest pain. She has numbness in her extremities. Has a hx of TIA's.  Per protocol, recommended to go to the nearest ED [facility]). Pt voiced understanding and that she will get someone to taker her there. Routing to [employer] at [facility]  Reason for Disposition . Patient sounds very sick or weak to the triager  Answer Assessment - Initial Assessment Questions 1. DESCRIPTION: "Describe your dizziness."     lighthead 2. LIGHTHEADED: "Do you feel lightheaded?" (e.g., somewhat faint, woozy, weak upon standing)     woozy 3. VERTIGO: "Do you feel like either you or the room is spinning or tilting?" (i.e. vertigo)     no 4. SEVERITY: "How bad is it?"  "Do you feel like you are going to faint?" "Can you stand and walk?"   - MILD - walking normally   - MODERATE - interferes with normal activities (e.g., work, school)    - SEVERE - unable to stand, requires support to walk, feels like passing out now.      moderate 5. ONSET:  "When did the dizziness begin?"     This morning 6. AGGRAVATING FACTORS: "Does anything make it worse?" (e.g., standing, change in head position)     Standing up and changing head position 7. HEART RATE: "Can you tell me your heart rate?" "How many beats in 15 seconds?"  (Note: not all patients can do this)       no 8. CAUSE: "What do you think is causing the dizziness?"     Not sure 9. RECURRENT SYMPTOM: "Have you had dizziness before?" If so, ask: "When was the last time?" "What happened that time?"     Yes had once before about 2 months ago and was dx with syncope 10. OTHER  SYMPTOMS: "Do you have any other symptoms?" (e.g., fever, chest pain, vomiting, diarrhea, bleeding)       no 11. PREGNANCY: "Is there any chance you are pregnant?" "When was your last menstrual period?"       no  Protocols used: DIZZINESS [facility]

## 2018-07-07 NOTE — ED Notes (Signed)
Pt transported to MRI 

## 2018-07-07 NOTE — ED Notes (Signed)
[facility] called  

## 2018-07-07 NOTE — ED Triage Notes (Addendum)
Pt presents with c/o fall this morning and dizziness. Pt reports she talked to a nurse and they wanted her to be evaluated to rule out a stroke. Pt denies any confusion. Pt is able answer all questions, no slurring of words. Pt visitor at bedside reports pt was very unsteady on her feet. No immediate neuro deficits noted.

## 2018-07-07 NOTE — ED Provider Notes (Signed)
[facility] DEPT Provider Note   CSN: [id number] Arrival date & time: 07/07/18  5277     History   Chief Complaint Chief Complaint  Patient presents with  . Fall  . Dizziness    HPI Rebekah Kennedy is a 63 y.o. female.  63 yo F with a cc of dizziness.  Going on since she woke up at 5am.  Feels unsteady with her gait.  Describes this as a fogginess in her ears.  Seems only happen when she stands up.  Denies symptoms currently.  Hx of stroke in the past.  Denies continued deficits.  Denies headache, head injury, neck pain.  Patient was able to eat and drive to work.  Patient at work lost her balance and fell.  Denies injury.  She denies unilateral numbness or weakness, difficulty with speech or swallowing.    The history is provided by the patient.  Fall  This is a new problem. The current episode started yesterday. The problem occurs constantly. The problem has not changed since onset.Pertinent negatives include no chest pain, no abdominal pain, no headaches and no shortness of breath. Nothing aggravates the symptoms. She has tried nothing for the symptoms. The treatment provided no relief.  Dizziness  Associated symptoms: no chest pain, no headaches, no nausea, no palpitations, no shortness of breath and no vomiting     Past Medical History:  Diagnosis Date  . ASTHMA NOS W/ACUTE EXACERBATION 07/10/2010  . Cervical dysplasia   . DEPRESSION 03/16/2007  . DIABETES MELLITUS, TYPE II 12/01/2006  . Dysmenorrhea   . Fibroid   . HYPERLIPIDEMIA 12/01/2006  . HYPERTENSION 07/07/2006  . Neuropathy   . Obesity   . Osteopenia 05/2017   T score -1.5 FRAX 2.6% / 0.1%  . Retinal edema    gets steriod inj in the eyes    . Sleep apnea    HAS CPAP BUT ADMITS DOES NOT USE JUDICIALLY   . Splenomegaly    in college  . Stroke [facility]) 2012   R pontine, mild residual left hemiparesis  . TIA (transient ischemic attack) 2012   2 TIAs 1 week apart of each other      Patient Active Problem List   Diagnosis Date Noted  . Stroke-like symptoms 07/07/2018  . Numbness and tingling 12/17/2017  . Right foot drop 12/17/2017  . Atherosclerosis of aorta ([place]) 10/09/2017  . AKI (acute kidney injury) ([place])   . Bariatric surgery status, 07/2017 08/19/2017  . Constipation 08/19/2017  . Hypertensive urgency 05/23/2017  . Greater trochanteric bursitis of both hips 12/19/2016  . OSA (obstructive sleep apnea) 07/26/2016  . Hypersomnia 06/11/2016  . Diabetes ([place]) 07/19/2015  . History of colonic polyps 12/01/2014  . Morbidly obese ([place]) 10/05/2013  . Partial nontraumatic tear of right rotator cuff 09/21/2013  . Goiter 12/10/2011  . Asthma 12/10/2011  . 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 History of TIA (transient ischemic attack) 02/13/2011  . PROTEINURIA, MILD 09/15/2009  . Hyperlipidemia 12/01/2006  . Essential hypertension 07/07/2006    Past Surgical History:  Procedure Laterality Date  . 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 APPENDECTOMY    . BIOPSY THYROID  05/02/11   Nonneoplastic goiter  . BREAST BIOPSY    . BREAST LUMPECTOMY WITH RADIOACTIVE SEED LOCALIZATION Left 07/25/2017   Procedure: LEFT BREAST LUMPECTOMY WITH RADIOACTIVE SEED LOCALIZATION;  Surgeon: Coralie Keens, MD;  Location: [facility];  Service: General;  Laterality: Left;  . BREAST SURGERY  Reduction  . COLPOSCOPY    . DILATION AND CURETTAGE OF UTERUS  1975   DUB  . EYE SURGERY     Laser  . GASTRIC ROUX-EN-Y N/A 08/12/2017   Procedure: LAPAROSCOPIC ROUX-EN-Y GASTRIC BYPASS WITH HIATAL HERNIA REPAIR AND UPPER ENDOSCOPY;  Surgeon: [name], [name], MD;  Location: [facility];  Service: General;  Laterality: N/A;  . GYNECOLOGIC CRYOSURGERY    . MYOMECTOMY    . OVARIAN CYST REMOVAL    . PELVIC LAPAROSCOPY  75,88   DL lysis of adhesions     OB History    Gravida  0   Para      Term      Preterm      AB      Living        SAB      TAB      Ectopic       Multiple      Live Births               Home Medications    Prior to Admission medications   Medication Sig Start Date End Date Taking? Authorizing Provider  albuterol (PROVENTIL HFA;VENTOLIN HFA) 108 (90 Base) MCG/ACT inhaler Inhale 2 puffs into the lungs every 4 (four) hours as needed for wheezing or shortness of breath. 07/23/17  Yes Burns, Claudina Lick, MD  amLODipine (NORVASC) 5 MG tablet Take 1 tablet (5 mg total) by mouth daily. 10/09/17  Yes Burns, Claudina Lick, MD  Biotin 5000 MCG TABS Take 5,000 mcg by mouth daily.    Yes [provider]  clopidogrel (PLAVIX) 75 MG tablet Take 1 tablet (75 mg total) by mouth daily. 10/09/17  Yes Burns, Claudina Lick, MD  losartan (COZAAR) 100 MG tablet TAKE 1 TAB BY MOUTH ONCE DAILY. Patient taking differently: Take 100 mg by mouth daily.  08/16/17  Yes Burns, Claudina Lick, MD  metoprolol succinate (TOPROL-XL) 100 MG 24 hr tablet TAKE 1 TABLET BY MOUTH DAILY WITH OR IMMEDIATELY FOLLOWING A MEAL Patient taking differently: Take 100 mg by mouth daily.  01/31/17  Yes Burns, Claudina Lick, MD  montelukast (SINGULAIR) 10 MG tablet Take 1 tablet (10 mg total) by mouth at bedtime. Patient taking differently: Take 10 mg by mouth daily.  01/15/17  Yes Burns, Claudina Lick, MD  OVER THE COUNTER MEDICATION Take 1 tablet by mouth daily. BariMelt   Yes [provider]  pantoprazole (PROTONIX) 40 MG tablet Take 40 mg by mouth daily. 07/19/17  Yes [provider]  rosuvastatin (CRESTOR) 10 MG tablet Take 1 tablet (10 mg total) by mouth daily. 10/09/17  Yes Burns, Claudina Lick, MD  sitaGLIPtin (JANUVIA) 100 MG tablet Take 1 tablet (100 mg total) by mouth daily. 08/29/17  Yes Renato Shin, MD  spironolactone (ALDACTONE) 25 MG tablet Take 1 tablet (25 mg total) by mouth daily. 08/19/17  Yes Burns, Claudina Lick, MD  polyethylene glycol (MIRALAX / GLYCOLAX) packet Take 17 g by mouth daily. Patient not taking: Reported on 07/07/2018 09/28/17   Thurnell Lose, MD    Family History Family  History  Problem Relation Age of Onset  . Cancer Maternal Grandmother        Colon Cancer  . Asthma Maternal Grandmother   . Diabetes Father   . Heart disease Father   . Hypertension Father   . Hyperlipidemia Father   . Hypertension Mother   . Heart disease Mother   . Ovarian cancer Mother   . Cancer  Mother        Lung cancer  . Asthma Mother   . COPD Mother   . Hyperlipidemia Mother   . Diabetes Brother   . Hypertension Brother   . Kidney disease Brother   . Asthma Brother   . Heart disease Brother   . Hyperlipidemia Brother   . Cancer Brother        Prostate  . Graves' disease Sister   . Diabetes Sister   . Breast cancer Sister 30  . Graves' disease Paternal Grandmother   . Hypertension Paternal Grandmother   . Heart disease Paternal Grandmother   . Alzheimer's disease Paternal Grandmother   . Cancer Maternal Grandfather   . Heart failure Brother   . Heart failure Brother     Social History Social History   Tobacco Use  . Smoking status: Never Smoker  . Smokeless tobacco: Never Used  Substance Use Topics  . Alcohol use: Yes    Alcohol/week: 0.0 standard drinks    Comment: very rare  . Drug use: No     Allergies   Quinapril   Review of Systems Review of Systems  Constitutional: Negative for chills and fever.  HENT: Negative for congestion and rhinorrhea.   Eyes: Negative for redness and visual disturbance.  Respiratory: Negative for shortness of breath and wheezing.   Cardiovascular: Negative for chest pain and palpitations.  Gastrointestinal: Negative for abdominal pain, nausea and vomiting.  Genitourinary: Negative for dysuria and urgency.  Musculoskeletal: Negative for arthralgias and myalgias.  Skin: Negative for pallor and wound.  Neurological: Positive for dizziness. Negative for headaches.     Physical Exam Updated Vital Signs BP (!) 179/77 (BP Location: Right Arm)   Pulse 68   Temp 97.7 F (36.5 C) (Oral)   Resp 15   Ht 5' 5.75"  (1.67 m)   Wt 79.4 kg   SpO2 100%   BMI 28.46 kg/m   Physical Exam Vitals signs and nursing note reviewed.  Constitutional:      General: She is not in acute distress.    Appearance: She is well-developed. She is not diaphoretic.  HENT:     Head: Normocephalic and atraumatic.  Eyes:     Pupils: Pupils are equal, round, and reactive to light.  Neck:     Musculoskeletal: Normal range of motion and neck supple.  Cardiovascular:     Rate and Rhythm: Normal rate and regular rhythm.     Heart sounds: No murmur. No friction rub. No gallop.   Pulmonary:     Effort: Pulmonary effort is normal.     Breath sounds: No wheezing or rales.  Abdominal:     General: There is no distension.     Palpations: Abdomen is soft.     Tenderness: There is no abdominal tenderness.  Musculoskeletal:        General: No tenderness.  Skin:    General: Skin is warm and dry.  Neurological:     Mental Status: She is alert and oriented to person, place, and time.     Cranial Nerves: Cranial nerves are intact.     Sensory: Sensation is intact.     Motor: Motor function is intact.     Coordination: Coordination abnormal.     Comments: [place] finger-to-nose the patient misses my finger with the right upper extremity and then comes back to touch it.  Intact finger-to-nose with the left upper extremity.  Ataxic gait.  Psychiatric:  Behavior: Behavior normal.      ED Treatments / Results  Labs (all labs ordered are listed, but only abnormal results are displayed) Labs Reviewed  COMPREHENSIVE METABOLIC PANEL - Abnormal; Notable for the following components:      Result Value   Glucose, Bld 261 (*)    All other components within normal limits  CBG MONITORING, ED - Abnormal; Notable for the following components:   Glucose-Capillary 270 (*)    All other components within normal limits  ETHANOL  PROTIME-INR  APTT  DIFFERENTIAL  CBC  URINALYSIS, ROUTINE W REFLEX MICROSCOPIC  RAPID URINE DRUG  SCREEN, HOSP PERFORMED    EKG EKG Interpretation  Date/Time:  Monday July 07 2018 09:38:29 EST Ventricular Rate:  67 PR Interval:    QRS Duration: 95 QT Interval:  397 QTC Calculation: 420 R Axis:   62 Text Interpretation:  Sinus rhythm Probable anteroseptal infarct, recent No significant change since last tracing Confirmed by Deno Etienne [id number]) on 07/07/2018 9:55:00 AM   Radiology Ct Head Wo Contrast  Result Date: 07/07/2018 CLINICAL DATA:  Dizziness this morning. Fell at home. Patient is on Plavix. EXAM: CT HEAD WITHOUT CONTRAST TECHNIQUE: Contiguous axial images were obtained from the base of the skull through the vertex without intravenous contrast. COMPARISON:  Head CT, 04/24/2018 FINDINGS: Brain: No evidence of acute infarction, hemorrhage, hydrocephalus, extra-axial collection or mass lesion/mass effect. Vascular: No hyperdense vessel or unexpected calcification. Skull: Normal. Negative for fracture or focal lesion. Sinuses/Orbits: Globes and orbits are unremarkable. The visualized sinuses and mastoid air cells are clear. Other: None. IMPRESSION: Normal unenhanced CT scan of the brain. Electronically Signed   By: Lajean Manes M.D.   On: 07/07/2018 11:32    Procedures Procedures (including critical care time)  Medications Ordered in ED Medications  sodium chloride flush (NS) 0.9 % injection 3 mL (has no administration in time range)   stroke: mapping our early stages of recovery book (has no administration in time range)  aspirin suppository 300 mg (has no administration in time range)    Or  aspirin tablet 325 mg (has no administration in time range)  diazepam (VALIUM) injection 2.5 mg (2.5 mg Intravenous Given 07/07/18 1101)  LORazepam (ATIVAN) injection 0.5 mg (0.5 mg Intravenous Given 07/07/18 1236)     Initial Impression / Assessment and Plan / ED Course  I have reviewed the triage vital signs and the nursing notes.  Pertinent labs & imaging results that were  available during my care of the patient were reviewed by me and considered in my medical decision making (see chart for details).     63 yo F with a chief complaint of dizziness.  She describes this as a fogginess in her ears.  On my exam the patient does have a ataxic gait as well as cerebellar signs with the right upper extremity.  Concern for stroke, patient had a history of same in the past.  Will discuss with neurology. Discussed with Dr. Leonel Ramsay, based on my description of findings recommended MRI and hospitalist admission at [facility].   The patients results and plan were reviewed and discussed.   Any x-rays performed were independently reviewed by myself.   Differential diagnosis were considered with the presenting HPI.  Medications  sodium chloride flush (NS) 0.9 % injection 3 mL (has no administration in time range)   stroke: mapping our early stages of recovery book (has no administration in time range)  aspirin suppository 300 mg (has no administration in time  range)    Or  aspirin tablet 325 mg (has no administration in time range)  diazepam (VALIUM) injection 2.5 mg (2.5 mg Intravenous Given 07/07/18 1101)  LORazepam (ATIVAN) injection 0.5 mg (0.5 mg Intravenous Given 07/07/18 1236)    Vitals:   07/07/18 0938 07/07/18 0939  BP:  (!) 179/77  Pulse:  68  Resp:  15  Temp:  97.7 F (36.5 C)  TempSrc:  Oral  SpO2:  100%  Weight: 79.4 kg   Height: 5' 5.75" (1.67 m)     Final diagnoses:  Ataxia    Admission/ observation were discussed with the admitting physician, patient and/or family and they are comfortable with the plan.    Final Clinical Impressions(s) / ED Diagnoses   Final diagnoses:  Ataxia    ED Discharge Orders    None       Deno Etienne, DO 07/07/18 1249

## 2018-07-07 NOTE — Consult Note (Addendum)
Stroke Neurology Consultation Note  Consult Requested by: Dr. Florene Glen  Reason for Consult: stroke  Consult Date: 07/07/18  The history was obtained from the pt.  During history and examination, all items were able to obtain unless otherwise noted.  History of Present Illness:  Rebekah Kennedy is a 63 y.o. African American female with PMH of diabetes, hypertension, hyperlipidemia, obesity, OSA on CPAP, stroke in 2012 admitted for unsteady gait, slurred speech and fall.  Patient had a stroke in 02/2011 with slurred speech, blurry vision and left-sided weakness.  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  MRA head and neck unremarkable.  Aspirin switch to Plavix.  Also on Crestor at that time.  Patient also follow with Dr. Posey Pronto at [facility] neurology for diabetic neuropathy as well as right foot drop.  This time patient started feeding and steady gait time imbalance since yesterday, however, she was able to function, denies any numbness tingling or focal weakness.  She went to work today and fell at work place, she was sent to ER for evaluation.  MRI showed left thalamic infarct.  Neurology was consulted for stroke.  LSN: Sometime yesterday tPA Given: No: Outside window  Past Medical History:  Diagnosis Date  . ASTHMA NOS W/ACUTE EXACERBATION 07/10/2010  . Cervical dysplasia   . DEPRESSION 03/16/2007  . DIABETES MELLITUS, TYPE II 12/01/2006  . Dysmenorrhea   . Fibroid   . HYPERLIPIDEMIA 12/01/2006  . HYPERTENSION 07/07/2006  . Neuropathy   . Obesity   . Osteopenia 05/2017   T score -1.5 FRAX 2.6% / 0.1%  . Retinal edema    gets steriod inj in the eyes    . Sleep apnea    HAS CPAP BUT ADMITS DOES NOT USE JUDICIALLY   . Splenomegaly    in college  . Stroke [facility]) 2012   R pontine, mild residual left hemiparesis  . TIA (transient ischemic attack) 2012   2 TIAs 1 week apart of each other     Past Surgical History:  Procedure Laterality Date  . 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 APPENDECTOMY    . BIOPSY THYROID  05/02/11   Nonneoplastic goiter  . BREAST BIOPSY    . BREAST LUMPECTOMY WITH RADIOACTIVE SEED LOCALIZATION Left 07/25/2017   Procedure: LEFT BREAST LUMPECTOMY WITH RADIOACTIVE SEED LOCALIZATION;  Surgeon: Coralie Keens, MD;  Location: [facility];  Service: General;  Laterality: Left;  . BREAST SURGERY     Reduction  . COLPOSCOPY    . DILATION AND CURETTAGE OF UTERUS  1975   DUB  . EYE SURGERY     Laser  . GASTRIC ROUX-EN-Y N/A 08/12/2017   Procedure: LAPAROSCOPIC ROUX-EN-Y GASTRIC BYPASS WITH HIATAL HERNIA REPAIR AND UPPER ENDOSCOPY;  Surgeon: [name], [name], MD;  Location: [facility];  Service: General;  Laterality: N/A;  . GYNECOLOGIC CRYOSURGERY    . MYOMECTOMY    . OVARIAN CYST REMOVAL    . PELVIC LAPAROSCOPY  75,88   DL lysis of adhesions    Family History  Problem Relation Age of Onset  . Cancer Maternal Grandmother        Colon Cancer  . Asthma Maternal Grandmother   . Diabetes Father   . Heart disease Father   . Hypertension Father   . Hyperlipidemia Father   . Hypertension Mother   . Heart disease Mother   . Ovarian cancer Mother   . Cancer Mother  Lung cancer  . Asthma Mother   . COPD Mother   . Hyperlipidemia Mother   . Diabetes Brother   . Hypertension Brother   . Kidney disease Brother   . Asthma Brother   . Heart disease Brother   . Hyperlipidemia Brother   . Cancer Brother        Prostate  . Graves' disease Sister   . Diabetes Sister   . Breast cancer Sister 28  . Graves' disease Paternal Grandmother   . Hypertension Paternal Grandmother   . Heart disease Paternal Grandmother   . Alzheimer's disease Paternal Grandmother   . Cancer Maternal Grandfather   . Heart failure Brother   . Heart failure Brother     Social History:  reports that she has never smoked. She has never used smokeless tobacco. She reports current alcohol use. She reports that she does not use  drugs.  Allergies:  Allergies  Allergen Reactions  . Quinapril Cough    No current facility-administered medications on file prior to encounter.    Current Outpatient Medications on File Prior to Encounter  Medication Sig Dispense Refill  . albuterol (PROVENTIL HFA;VENTOLIN HFA) 108 (90 Base) MCG/ACT inhaler Inhale 2 puffs into the lungs every 4 (four) hours as needed for wheezing or shortness of breath. 18 g 2  . amLODipine (NORVASC) 5 MG tablet Take 1 tablet (5 mg total) by mouth daily. 90 tablet 3  . Biotin 5000 MCG TABS Take 5,000 mcg by mouth daily.     . clopidogrel (PLAVIX) 75 MG tablet Take 1 tablet (75 mg total) by mouth daily. 90 tablet 3  . losartan (COZAAR) 100 MG tablet TAKE 1 TAB BY MOUTH ONCE DAILY. (Patient taking differently: Take 100 mg by mouth daily. ) 90 tablet 1  . metoprolol succinate (TOPROL-XL) 100 MG 24 hr tablet TAKE 1 TABLET BY MOUTH DAILY WITH OR IMMEDIATELY FOLLOWING A MEAL (Patient taking differently: Take 100 mg by mouth daily. ) 90 tablet 1  . montelukast (SINGULAIR) 10 MG tablet Take 1 tablet (10 mg total) by mouth at bedtime. (Patient taking differently: Take 10 mg by mouth daily. ) 90 tablet 3  . OVER THE COUNTER MEDICATION Take 1 tablet by mouth daily. BariMelt    . pantoprazole (PROTONIX) 40 MG tablet Take 40 mg by mouth daily.  2  . rosuvastatin (CRESTOR) 10 MG tablet Take 1 tablet (10 mg total) by mouth daily. 90 tablet 3  . sitaGLIPtin (JANUVIA) 100 MG tablet Take 1 tablet (100 mg total) by mouth daily. 30 tablet 11  . spironolactone (ALDACTONE) 25 MG tablet Take 1 tablet (25 mg total) by mouth daily. 90 tablet 1  . polyethylene glycol (MIRALAX / GLYCOLAX) packet Take 17 g by mouth daily. (Patient not taking: Reported on 07/07/2018) 14 each 0    Review of Systems: A full ROS was attempted today and was able to be performed.  Systems assessed include - Constitutional, Eyes, HENT, Respiratory, Cardiovascular, Gastrointestinal, Genitourinary,  Integument/breast, Hematologic/lymphatic, Musculoskeletal, Neurological, Behavioral/Psych, Endocrine, Allergic/Immunologic - with pertinent responses as per HPI.  Physical Examination: Temp:  [97.7 F (36.5 C)-97.8 F (36.6 C)] 97.8 F (36.6 C) (02/17 1632) Pulse Rate:  [60-68] 60 (02/17 1632) Resp:  [15-21] 21 (02/17 1632) BP: (129-179)/(72-77) 145/74 (02/17 1632) SpO2:  [100 %] 100 % (02/17 1632) Weight:  [79.4 kg] 79.4 kg (02/17 0938)  General - well nourished, well developed, in no apparent distress.    Ophthalmologic - fundi not visualized due to noncooperation.  Cardiovascular - regular rate and rhythm  Mental Status -  Level of arousal and orientation to time, place, and person were intact. Language including expression, naming, repetition, comprehension was assessed and found intact. Fund of Knowledge was assessed and was intact.  Cranial Nerves II - XII - II - Vision intact OU. III, IV, VI - Extraocular movements intact. V - Facial sensation intact bilaterally. VII - right nasolabial fold flattening. VIII - Hearing & vestibular intact bilaterally. X - Palate elevates symmetrically, mild dysarthria. XI - Chin turning & shoulder shrug intact bilaterally. XII - Tongue protrusion intact.  Motor Strength - The patient's strength was normal in all extremities and pronator drift was absent except right hand decreased dexterity and chronic right foot 4/5 DF.   Motor Tone & Bulk - Muscle tone was assessed at the neck and appendages and was normal.  Bulk was normal and fasciculations were absent.   Reflexes - The patient's reflexes were normal in all extremities and she had no pathological reflexes.  Sensory - Light touch, temperature/pinprick were assessed and were normal.    Coordination - The patient had normal movements in the left hand and foot with no ataxia or dysmetria.  However, right finger-to-nose and heel-to-shin ataxic.  Tremor was absent.  Gait and Station -  deferred  Data Reviewed: Ct Head Wo Contrast  Result Date: 07/07/2018 CLINICAL DATA:  Dizziness this morning. Fell at home. Patient is on Plavix. EXAM: CT HEAD WITHOUT CONTRAST TECHNIQUE: Contiguous axial images were obtained from the base of the skull through the vertex without intravenous contrast. COMPARISON:  Head CT, 04/24/2018 FINDINGS: Brain: No evidence of acute infarction, hemorrhage, hydrocephalus, extra-axial collection or mass lesion/mass effect. Vascular: No hyperdense vessel or unexpected calcification. Skull: Normal. Negative for fracture or focal lesion. Sinuses/Orbits: Globes and orbits are unremarkable. The visualized sinuses and mastoid air cells are clear. Other: None. IMPRESSION: Normal unenhanced CT scan of the brain. Electronically Signed   By: Lajean Manes M.D.   On: 07/07/2018 11:32   Mr Rebekah Kennedy Head Wo Contrast  Result Date: 07/07/2018 CLINICAL DATA:  Focal neuro deficit greater than 6 hours. Dizziness. Fall. EXAM: MRI HEAD WITHOUT CONTRAST MRA HEAD WITHOUT CONTRAST TECHNIQUE: Multiplanar, multiecho pulse sequences of the brain and surrounding structures were obtained without intravenous contrast. Angiographic images of the head were obtained using MRA technique without contrast. COMPARISON:  CT head 07/07/2018 FINDINGS: MRI HEAD FINDINGS Brain: Acute infarct in the left anterior thalamus measuring approximately 1 cm. No other acute infarct. Ventricle size normal. Mild chronic microvascular ischemic changes in the white matter. Negative for hemorrhage mass or fluid collection. Vascular: Normal arterial flow voids Skull and upper cervical spine: Negative Sinuses/Orbits: Paranasal sinuses clear. Bilateral cataract surgery. Other: None MRA HEAD FINDINGS Both vertebral arteries widely patent. PICA patent bilaterally. Basilar widely patent. Superior cerebellar and posterior cerebral arteries patent bilaterally. Fetal origin left posterior cerebral artery. Internal carotid artery patent  bilaterally with mild stenosis on the right due to atherosclerotic irregularity. Anterior and middle cerebral arteries patent bilaterally. Mild to moderate stenosis proximal left A1 segment. Mild to moderate stenosis proximal right M1 segment. Mild stenosis left M1 segment. No large vessel occlusion. Negative for aneurysm. IMPRESSION: Small area of acute infarction left anterior thalamus. Mild chronic microvascular ischemic changes in the white matter Intracranial atherosclerotic disease as above. Negative for emergent large vessel occlusion. Electronically Signed   By: Franchot Gallo M.D.   On: 07/07/2018 13:36   Mr Brain Wo Contrast  Result Date:  07/07/2018 CLINICAL DATA:  Focal neuro deficit greater than 6 hours. Dizziness. Fall. EXAM: MRI HEAD WITHOUT CONTRAST MRA HEAD WITHOUT CONTRAST TECHNIQUE: Multiplanar, multiecho pulse sequences of the brain and surrounding structures were obtained without intravenous contrast. Angiographic images of the head were obtained using MRA technique without contrast. COMPARISON:  CT head 07/07/2018 FINDINGS: MRI HEAD FINDINGS Brain: Acute infarct in the left anterior thalamus measuring approximately 1 cm. No other acute infarct. Ventricle size normal. Mild chronic microvascular ischemic changes in the white matter. Negative for hemorrhage mass or fluid collection. Vascular: Normal arterial flow voids Skull and upper cervical spine: Negative Sinuses/Orbits: Paranasal sinuses clear. Bilateral cataract surgery. Other: None MRA HEAD FINDINGS Both vertebral arteries widely patent. PICA patent bilaterally. Basilar widely patent. Superior cerebellar and posterior cerebral arteries patent bilaterally. Fetal origin left posterior cerebral artery. Internal carotid artery patent bilaterally with mild stenosis on the right due to atherosclerotic irregularity. Anterior and middle cerebral arteries patent bilaterally. Mild to moderate stenosis proximal left A1 segment. Mild to moderate  stenosis proximal right M1 segment. Mild stenosis left M1 segment. No large vessel occlusion. Negative for aneurysm. IMPRESSION: Small area of acute infarction left anterior thalamus. Mild chronic microvascular ischemic changes in the white matter Intracranial atherosclerotic disease as above. Negative for emergent large vessel occlusion. Electronically Signed   By: Franchot Gallo M.D.   On: 07/07/2018 13:36    Assessment: 63 y.o. female with PMH of diabetes, hypertension, hyperlipidemia, obesity, OSA on CPAP, neuropathy following with Dr. Posey Pronto at [facility], brainstem stroke in 2012 admitted for unsteady gait, slurred speech and fall. MRI showed left thalamic infarct. MRA showed mild to moderate right M1 stenosis. Neurology was consulted for stroke.  Her stroke this time as well as in 2012 are both consistent with small vessel disease given location and her multiple risk factors. Will need finished stroke work up. Agree with DAPT and continue crestor.   Stroke Risk Factors - diabetes mellitus, hyperlipidemia, hypertension and obesity and OSA  Plan: - HgbA1c, fasting lipid panel - PT consult, OT consult, Speech consult - Echocardiogram - Carotid dopplers - Prophylactic therapy-Antiplatelet med: Aspirin - dose 81 and Antiplatelet med: Plavix - dose 75 - Risk factor modification - Telemetry monitoring - Frequent neuro checks  Thank you for this consultation and allowing [place] to participate in the care of this patient.   Rosalin Hawking, MD PhD Stroke Neurology 07/07/2018 9:39 PM

## 2018-07-07 NOTE — Progress Notes (Signed)
Pt was drowsy but arousable when arriving to the unit. Pt did receive ativan and valium in the ED. This made it difficult to do initial neuro assessments. Continuing to monitor and will update stroke assessments throughout rest of shift.

## 2018-07-08 ENCOUNTER — Inpatient Hospital Stay (HOSPITAL_COMMUNITY): Payer: BC Managed Care – PPO

## 2018-07-08 ENCOUNTER — Other Ambulatory Visit (HOSPITAL_COMMUNITY): Payer: BC Managed Care – PPO

## 2018-07-08 ENCOUNTER — Telehealth: Payer: Self-pay | Admitting: Internal Medicine

## 2018-07-08 DIAGNOSIS — E1151 Type 2 diabetes mellitus with diabetic peripheral angiopathy without gangrene: Secondary | ICD-10-CM

## 2018-07-08 DIAGNOSIS — Z794 Long term (current) use of insulin: Secondary | ICD-10-CM

## 2018-07-08 DIAGNOSIS — I10 Essential (primary) hypertension: Secondary | ICD-10-CM

## 2018-07-08 DIAGNOSIS — R270 Ataxia, unspecified: Secondary | ICD-10-CM

## 2018-07-08 LAB — LIPID PANEL
Cholesterol: 176 mg/dL (ref 0–200)
HDL: 62 mg/dL (ref >40)
LDL Cholesterol: 81 mg/dL (ref 0–99)
Total CHOL/HDL Ratio: 2.8 RATIO
Triglycerides: 163 mg/dL — ABNORMAL HIGH (ref <150)
VLDL: 33 mg/dL (ref 0–40)

## 2018-07-08 LAB — HIV ANTIBODY (ROUTINE TESTING W REFLEX): HIV Screen 4th Generation wRfx: NONREACTIVE

## 2018-07-08 LAB — GLUCOSE, CAPILLARY
Glucose-Capillary: 165 mg/dL — ABNORMAL HIGH (ref 70–99)
Glucose-Capillary: 233 mg/dL — ABNORMAL HIGH (ref 70–99)

## 2018-07-08 LAB — COMPREHENSIVE METABOLIC PANEL
ALK PHOS: 72 U/L (ref 38–126)
ALT: 25 U/L (ref 0–44)
AST: 18 U/L (ref 15–41)
Albumin: 3.1 g/dL — ABNORMAL LOW (ref 3.5–5.0)
Anion gap: 8 (ref 5–15)
BILIRUBIN TOTAL: 0.5 mg/dL (ref 0.3–1.2)
BUN: 17 mg/dL (ref 8–23)
CO2: 28 mmol/L (ref 22–32)
CREATININE: 0.85 mg/dL (ref 0.44–1.00)
Calcium: 9.3 mg/dL (ref 8.9–10.3)
Chloride: 102 mmol/L (ref 98–111)
GFR calc Af Amer: >60 mL/min (ref >60)
GFR calc non Af Amer: >60 mL/min (ref >60)
Glucose, Bld: 152 mg/dL — ABNORMAL HIGH (ref 70–99)
Potassium: 4.3 mmol/L (ref 3.5–5.1)
Sodium: 138 mmol/L (ref 135–145)
Total Protein: 6 g/dL — ABNORMAL LOW (ref 6.5–8.1)

## 2018-07-08 LAB — ECHOCARDIOGRAM COMPLETE
Height: 65.75 in
Weight: 2800 oz

## 2018-07-08 LAB — CBC
HEMATOCRIT: 38.3 % (ref 36.0–46.0)
Hemoglobin: 12.7 g/dL (ref 12.0–15.0)
MCH: 28.2 pg (ref 26.0–34.0)
MCHC: 33.2 g/dL (ref 30.0–36.0)
MCV: 84.9 fL (ref 80.0–100.0)
Platelets: 276 10*3/uL (ref 150–400)
RBC: 4.51 MIL/uL (ref 3.87–5.11)
RDW: 12.5 % (ref 11.5–15.5)
WBC: 8.1 10*3/uL (ref 4.0–10.5)
nRBC: 0 % (ref 0.0–0.2)

## 2018-07-08 MED ORDER — ASPIRIN 81 MG PO TBEC: 81.0000 mg | DELAYED_RELEASE_TABLET | Freq: Every day | ORAL | 0 refills | Status: DC

## 2018-07-08 NOTE — Care Management Note (Signed)
Case Management Note  Patient Details  Name: Rebekah Kennedy MRN: [id number] Date of Birth: Apr 11, 1956  Subjective/Objective:   Admitted with stroke like symptoms.Hx of DM, HTN, hyperlipidemia, obesity, OSA on CPAP, CVA 2012, depression, neuropathy, retinal edema.        Alison Stalling (Daughter) Amie Critchley [phone]     PCP: Celso Amy  Action/Plan: Pt declines SNF placement . Agreeable to home health services. Pt states @ d/cplan is to live with daughter, Enis Gash.Enis Gash address: [address], [place].  Expected Discharge Date:  07/08/18               Expected Discharge Plan:  [place]  In-House Referral:  Clinical Social Work(Declines SNF placement)  Discharge planning Services  CM Consult  Post Acute Care Choice:    Choice offered to:  Patient  DME Arranged:   n/a DME Agency:   n/a  HH Arranged:  PT, OT HH Agency:   [facility]  Status of Service:  completed  If discussed at [place] of Stay Meetings, dates discussed:    Additional Comments:  Sharin Mons, RN 07/08/2018, 2:52 PM

## 2018-07-08 NOTE — Progress Notes (Signed)
  Echocardiogram 2D Echocardiogram has been performed.  Rebekah Kennedy G Rebekah Kennedy 07/08/2018, 11:49 AM

## 2018-07-08 NOTE — Discharge Instructions (Signed)
Ataxia  Ataxia is a condition that causes unsteadiness when walking and standing, poor coordination of body movements, and difficulty keeping a straight (upright) posture. It occurs because of a problem with the part of the brain that controls coordination and stability (cerebellum). Ataxia can develop later in life (acquired ataxia), during your 20s or 30s or even into your 60s or later. This type of ataxia develops when another medical condition, such as a stroke, damages the cerebellum. Ataxia also may be present early in life (non-acquired ataxia). There are two main types of non-acquired ataxia:  Congenital. This type is present at birth.  Hereditary. This type is passed from parent to child. The most common form of hereditary non-acquired ataxia is Friedreich ataxia. What are the causes? Acquired ataxia may be caused by:  Changes in the nervous system (neurodegenerative changes).  Changes throughout the body (systemic disorders).  A lot of exposure to: ? Certain medicines such as phenytoin and lithium. ? Solvents. These are cleaning fluids such as paint thinner, nail polish remover, carpet cleaner, and degreasers.  Alcohol abuse (alcoholism).  Medical conditions, such as: ? Celiac disease. ? Hypothyroidism. ? A lack (deficiency) of vitamin E, vitamin B12, or thiamine. ? Brain tumors. ? Multiple sclerosis. ? Cerebral palsy. ? Stroke. ? Paraneoplastic syndromes. ? Viral infections. ? Head injury. ? Malnutrition. Congenital and hereditary ataxia are caused by problems that are present in genes before birth. What are the signs or symptoms? Signs and symptoms of ataxia vary depending on the cause. They may include:  Being unsteady.  Walking with the legs wide apart (wide stance) to keep one's balance.  Uncontrolled shaking (tremor).  Poorly coordinated body movements.  Difficulty maintaining an upright posture.  Fatigue.  Changes in speech.  Changes in  vision.  Involuntary eye movements (nystagmus).  Difficulty swallowing.  Difficulty writing.  Muscle tightening that you cannot control (muscle spasms). How is this diagnosed? Ataxia may be diagnosed based on:  Your personal and family medical history.  A physical exam.  Imaging tests, such as a CT scan or MRI.  Spinal tap (lumbar puncture). This procedure involves using a needle to take a sample of the fluid around your brain and spinal cord.  Genetic testing. How is this treated? The underlying condition that causes your ataxia needs to be treated. If the cause is a brain tumor, you may need surgery. Treatment also focuses on helping you live with ataxia and improving your quality of life (supportive treatments). This may involve:  Learning ways to improve coordination and move around more carefully (physical therapy).  Learning ways to improve your ability to do daily tasks, such as bathing and feeding yourself (occupational therapy).  Using devices to help you move around, eat, or communicate (assistive devices), such as a walker, modified eating utensils, and communication aids.  Learning ways to improve speech and swallowing (speech therapy). Follow these instructions at home: Preventing falls  Lie down right away if you become very unsteady, dizzy, or nauseous, or if you feel like you are going to faint. Do not get up until all of those feelings pass.  Keep your home well-lit. Use night-lights as needed.  Remove tripping hazards, such as rugs, cords, and clutter.  Install grab bars by the toilet and in the tub and shower.  Use assistive devices such as a cane, walker, or wheelchair as needed to keep your balance. General instructions  Do not drink alcohol.  Ask your health care provider what activities are safe  for you, and what activities you should avoid.  Take over-the-counter and prescription medicines only as told by your health care provider. Get help  right away if you:  Have unsteadiness that suddenly worsens.  Have any of these: ? Severe headaches. ? Chest pain. ? Abdominal pain. ? Weakness or numbness on one side of your body. ? Vision problems. ? Difficulty speaking. ? An irregular heartbeat. ? A very fast pulse.  Feel confused. Summary  Ataxia is a condition that causes unsteadiness when walking and standing, poor coordination of body movements, and difficulty keeping a straight (upright) posture.  Ataxia occurs because of a problem with the part of the brain that controls coordination and stability (cerebellum).  The underlying condition that causes your ataxia needs to be treated. Treatment also focuses on helping you live with ataxia and improving your quality of life (supportive treatments).  Lie down right away if you become very unsteady, dizzy, or nauseous, or if you feel like you are going to faint. This information is not intended to replace advice given to you by your health care provider. Make sure you discuss any questions you have with your health care provider. Document Released: 12/02/2013 Document Revised: 03/08/2017 Document Reviewed: 03/08/2017 Elsevier Interactive Patient Education  2019 [place] Prevention in the Home, Adult Falls can cause injuries. They can happen to people of all ages. There are many things you can do to make your home safe and to help prevent falls. Ask for help when making these changes, if needed. What actions can I take to prevent falls? General Instructions  Use good lighting in all rooms. Replace any light bulbs that burn out.  Turn on the lights when you go into a dark area. Use night-lights.  Keep items that you use often in easy-to-reach places. Lower the shelves around your home if necessary.  Set up your furniture so you have a clear path. Avoid moving your furniture around.  Do not have throw rugs and other things on the floor that can make you  trip.  Avoid walking on wet floors.  If any of your floors are uneven, fix them.  Add color or contrast paint or tape to clearly mark and help you see: ? Any grab bars or handrails. ? First and last steps of stairways. ? Where the edge of each step is.  If you use a stepladder: ? Make sure that it is fully opened. Do not climb a closed stepladder. ? Make sure that both sides of the stepladder are locked into place. ? Ask someone to hold the stepladder for you while you use it.  If there are any pets around you, be aware of where they are. What can I do in the bathroom?      Keep the floor dry. Clean up any water that spills onto the floor as soon as it happens.  Remove soap buildup in the tub or shower regularly.  Use non-skid mats or decals on the floor of the tub or shower.  Attach bath mats securely with double-sided, non-slip rug tape.  If you need to sit down in the shower, use a plastic, non-slip stool.  Install grab bars by the toilet and in the tub and shower. Do not use towel bars as grab bars. What can I do in the bedroom?  Make sure that you have a light by your bed that is easy to reach.  Do not use any sheets or blankets that are  too big for your bed. They should not hang down onto the floor.  Have a firm chair that has side arms. You can use this for support while you get dressed. What can I do in the kitchen?  Clean up any spills right away.  If you need to reach something above you, use a strong step stool that has a grab bar.  Keep electrical cords out of the way.  Do not use floor polish or wax that makes floors slippery. If you must use wax, use non-skid floor wax. What can I do with my stairs?  Do not leave any items on the stairs.  Make sure that you have a light switch at the top of the stairs and the bottom of the stairs. If you do not have them, ask someone to add them for you.  Make sure that there are handrails on both sides of the  stairs, and use them. Fix handrails that are broken or loose. Make sure that handrails are as long as the stairways.  Install non-slip stair treads on all stairs in your home.  Avoid having throw rugs at the top or bottom of the stairs. If you do have throw rugs, attach them to the floor with carpet tape.  Choose a carpet that does not hide the edge of the steps on the stairway.  Check any carpeting to make sure that it is firmly attached to the stairs. Fix any carpet that is loose or worn. What can I do on the outside of my home?  Use bright outdoor lighting.  Regularly fix the edges of walkways and driveways and fix any cracks.  Remove anything that might make you trip as you walk through a door, such as a raised step or threshold.  Trim any bushes or trees on the path to your home.  Regularly check to see if handrails are loose or broken. Make sure that both sides of any steps have handrails.  Install guardrails along the edges of any raised decks and porches.  Clear walking paths of anything that might make someone trip, such as tools or rocks.  Have any leaves, snow, or ice cleared regularly.  Use sand or salt on walking paths during winter.  Clean up any spills in your garage right away. This includes grease or oil spills. What other actions can I take?  Wear shoes that: ? Have a low heel. Do not wear high heels. ? Have rubber bottoms. ? Are comfortable and fit you well. ? Are closed at the toe. Do not wear open-toe sandals.  Use tools that help you move around (mobility aids) if they are needed. These include: ? Canes. ? Walkers. ? Scooters. ? Crutches.  Review your medicines with your doctor. Some medicines can make you feel dizzy. This can increase your chance of falling. Ask your doctor what other things you can do to help prevent falls. Where to find more information  Centers for Disease Control and Prevention, STEADI: https://garcia.biz/  [employer]  on Aging: BrainJudge.co.uk Contact a doctor if:  You are afraid of falling at home.  You feel weak, drowsy, or dizzy at home.  You fall at home. Summary  There are many simple things that you can do to make your home safe and to help prevent falls.  Ways to make your home safe include removing tripping hazards and installing grab bars in the bathroom.  Ask for help when making these changes in your home. This information is  not intended to replace advice given to you by your health care provider. Make sure you discuss any questions you have with your health care provider. Document Released: 03/03/2009 Document Revised: 12/20/2016 Document Reviewed: 12/20/2016 Elsevier Interactive Patient Education  2019 [employer].

## 2018-07-08 NOTE — Evaluation (Signed)
Occupational Therapy Evaluation Patient Details Name: Rebekah Kennedy MRN: [id number] DOB: Feb 08, 1956 Today's Date: 07/08/2018    History of Present Illness This 62 y.o. female admitted with  slurred speech, unsteady gait, and a fall at work.  MRI showed Lt thalamic infarct.  PMH includes:  DM, HTN, hyperlipidemia, obesity, OSA on CPAP, h/o CVA 2012, depression, neuropathy, retinal edema (gets steroid injections)   Clinical Impression   Pt admitted with above. She demonstrates the below listed deficits and will benefit from continued OT to maximize safety and independence with BADLs.  Pt presents to OT with impaired balance, cognitive deficits including decreased awareness of safety and deficits, impaired problem solving, difficulty following multi step commands, and deficits with visual saccades, visual field testing yielded inconsistencies.   She requires min A for ADLs due to impaired balance.   Pt lived alone, was independent with ADLs, and functional mobility using Rt AFO.  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  Daughter present, and was very tearful re: pt's home situation.  Daughter would not elaborate (did mouth "there are clothes/things everywhere").  Pt indicates the furnace does not work, her kitchen sink is clogged and is non functional, the front door is inaccessible, or doesn't work, and the refrigerator does not work.  She states she has a clear pathway through her house, but then states "but they probably don't think so" re: her family.   Feel she will need assist at discharge due to balance and cognitive deficits.  She adamantly refuses SNF, and is considering going to her daughter's home.       Follow Up Recommendations  Home health OT;Other (comment)(Pt adamantly refusing SNF )    Equipment Recommendations  Tub/shower bench    Recommendations for Other Services       Precautions / Restrictions Precautions Precautions:  Fall Restrictions Weight Bearing Restrictions: No      Mobility Bed Mobility Overal bed mobility: Needs Assistance Bed Mobility: Supine to Sit     Supine to sit: Min assist     General bed mobility comments: Pt used momentum to move into sitting, and required assist to fully lift trunk and prevent her from falling back to the bed   Transfers Overall transfer level: Needs assistance                    Balance Overall balance assessment: Needs assistance Sitting-balance support: Feet supported Sitting balance-Leahy Scale: Good     Standing balance support: Single extremity supported Standing balance-Leahy Scale: Poor Standing balance comment: requires UE support                            ADL either performed or assessed with clinical judgement   ADL Overall ADL's : Needs assistance/impaired Eating/Feeding: Independent   Grooming: Wash/dry hands;Wash/dry face;Oral care;Minimal assistance;Standing   Upper Body Bathing: Set up;Sitting   Lower Body Bathing: Minimal assistance;Sit to/from stand   Upper Body Dressing : Set up;Sitting   Lower Body Dressing: Minimal assistance;Sit to/from stand   Toilet Transfer: Minimal assistance;Ambulation;Comfort height toilet Toilet Transfer Details (indicate cue type and reason): Pt stumbled to Lt x 1 requiring assist to recover.  Rt foot got "stuck" x1 and did not advance - she required assist to prevent fall  Toileting- Clothing Manipulation and Hygiene: Minimal assistance;Sit to/from stand       Functional mobility during ADLs: Minimal assistance General  ADL Comments: daughter present      Vision Baseline Vision/History: Wears glasses Wears Glasses: Reading only Patient Visual Report: No change from baseline Vision Assessment?: Yes Eye Alignment: Within Functional Limits Ocular Range of Motion: Within Functional Limits Alignment/Gaze Preference: Within Defined Limits Tracking/Visual Pursuits: Able to  track stimulus in all quads without difficulty Saccades: Additional eye shifts occurred during testing;Undershoots;Decreased speed of saccadic movement Visual Fields: Other (comment) Additional Comments: Pt demonsrated difficulty following multi step commands for visual assessment.  She frequently undershoots during saccades.  She demonstrated inconsistency with visual field assessment Rt superior and Lt inferior Visual merchandiser Perception Tested?: Yes   Praxis Praxis Praxis tested?: Within functional limits    Pertinent Vitals/Pain Pain Assessment: No/denies pain     Hand Dominance Right   Extremity/Trunk Assessment Upper Extremity Assessment Upper Extremity Assessment: Overall WFL for tasks assessed   Lower Extremity Assessment Lower Extremity Assessment: Defer to PT evaluation(reports h/o Rt foot drop and wears Rt AFO inconistently )   Cervical / Trunk Assessment Cervical / Trunk Assessment: Normal   Communication Communication Communication: Expressive difficulties(speech slurred )   Cognition Arousal/Alertness: Awake/alert Behavior During Therapy: Impulsive Overall Cognitive Status: Impaired/Different from baseline Area of Impairment: Attention;Following commands;Safety/judgement;Awareness;Problem solving                   Current Attention Level: Selective   Following Commands: Follows one step commands consistently;Follows multi-step commands inconsistently Safety/Judgement: Decreased awareness of safety Awareness: Intellectual Problem Solving: Slow processing;Requires verbal cues;Requires tactile cues General Comments: Pt is impulsive.  She had significant difficulty following 2 and 3 step commands during visual assessment.  She is able to state her deficits, but denies that they will cause her problems functionally.   She requires min cues for problem solving    General Comments  daughter present during eval     Exercises     Shoulder  Instructions      Home Living Family/patient expects to be discharged to:: Private residence   Available Help at Discharge: Family;Available PRN/intermittently Type of Home: House       Home Layout: One level     Bathroom Shower/Tub: Teacher, early years/pre: Standard     Home Equipment: Cane - single point   Additional Comments: Pt's daughter present and indicates concerns re: pt's home situation, but will not offer details.  Through questioning, pt reports that her furnace does not work (she uses space heaters), the refrigerator does not work, the Patent examiner work, and the front door either doesn't work, or is inaccessible.  When asked if she has a clear path through her house she states "I think I do, but they probably don't think so".  daughter mouthed "there are clothes/things everywhere"      Prior Functioning/Environment Level of Independence: Independent        Comments: Pt reports she drives. 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          OT Problem List: Decreased activity tolerance;Impaired balance (sitting and/or standing);Impaired vision/perception;Decreased cognition;Decreased safety awareness;Decreased knowledge of use of DME or AE      OT Treatment/Interventions: Self-care/ADL training;Neuromuscular education;DME and/or AE instruction;Therapeutic activities;Cognitive remediation/compensation;Patient/family education;Visual/perceptual remediation/compensation;Balance training    OT Goals(Current goals can be found in the care plan section) Acute Rehab OT Goals Patient Stated Goal: "I want to go home to my dogs"  OT Goal Formulation: With patient Time  For Goal Achievement: 07/22/18 Potential to Achieve Goals: Good ADL Goals Pt Will Perform Grooming: with supervision;standing Pt Will Perform Lower Body Bathing: with supervision;sit to/from stand Pt Will Perform Upper Body Dressing:  standing;with set-up Pt Will Perform Lower Body Dressing: with supervision;sit to/from stand Pt Will Transfer to Toilet: with supervision;ambulating;regular height toilet;grab bars Pt Will Perform Toileting - Clothing Manipulation and hygiene: with supervision;sit to/from stand Additional ADL Goal #1: Pt will participate in further cognitive assessment  OT Frequency: Min 2X/week   Barriers to D/C: Decreased caregiver support  unsure she has necessary level of assist at discharge, and there are concerns re: home environment and safety in the home        Co-evaluation              AM-PAC OT "6 Clicks" Daily Activity     Outcome Measure Help from another person eating meals?: None Help from another person taking care of personal grooming?: A Little Help from another person toileting, which includes using toliet, bedpan, or urinal?: A Little Help from another person bathing (including washing, rinsing, drying)?: A Little Help from another person to put on and taking off regular upper body clothing?: A Little Help from another person to put on and taking off regular lower body clothing?: A Little 6 Click Score: 19   End of Session Equipment Utilized During Treatment: Gait belt Nurse Communication: Mobility status  Activity Tolerance: Patient tolerated treatment well Patient left: in chair;with call bell/phone within reach;with chair alarm set;with family/visitor present  OT Visit Diagnosis: Unsteadiness on feet (R26.81);Cognitive communication deficit (R41.841) Symptoms and signs involving cognitive functions: Cerebral infarction                Time: 5784-6962 OT Time Calculation (min): 36 min Charges:  OT General Charges $OT Visit: 1 Visit OT Evaluation $OT Eval Moderate Complexity: 1 Mod OT Treatments $Self Care/Home Management : 8-22 mins  Lucille Passy, OTR/L [place] Pager [phone] Office [phone]   Lucille Passy M 07/08/2018, 1:31  PM

## 2018-07-08 NOTE — Telephone Encounter (Signed)
Copied from [place] [id number]. Topic: Quick Communication - Home Health Verbal Orders >> Jul 08, 2018  3:56 PM [name] wrote: Caller/Agency: [facility] Number: [phone] Requesting OT/PT/Skilled Nursing/Social Work: PT and OT Frequency: eval and treat

## 2018-07-08 NOTE — Progress Notes (Addendum)
STROKE TEAM PROGRESS NOTE   INTERVAL HISTORY Her echo tech is at the bedside performing the test.  There is not family at the bedside.  A friend arrived during rounds.  Vitals:   07/08/18 0130 07/08/18 0330 07/08/18 0542 07/08/18 0808  BP: 116/68 112/71    Pulse: 66 65    Resp: 19 (!) 21    Temp: 98.3 F (36.8 C) 98.4 F (36.9 C) 98.2 F (36.8 C) 98.2 F (36.8 C)  TempSrc: Oral Oral Oral Oral  SpO2: 96% 97%    Weight:      Height:        CBC:  Recent Labs  Lab 07/07/18 1040 07/08/18 0419  WBC 10.5 8.1  NEUTROABS 6.5  --   HGB 13.3 12.7  HCT 42.1 38.3  MCV 88.8 84.9  PLT 302 433    Basic Metabolic Panel:  Recent Labs  Lab 07/07/18 1040 07/08/18 0419  NA 139 138  K 4.2 4.3  CL 101 102  CO2 29 28  GLUCOSE 261* 152*  BUN 17 17  CREATININE 0.75 0.85  CALCIUM 9.4 9.3   Lipid Panel:     Component Value Date/Time   CHOL 176 07/08/2018 0419   TRIG 163 (H) 07/08/2018 0419   HDL 62 07/08/2018 0419   CHOLHDL 2.8 07/08/2018 0419   VLDL 33 07/08/2018 0419   LDLCALC 81 07/08/2018 0419   HgbA1c:  Lab Results  Component Value Date   HGBA1C 5.8 01/22/2018    PHYSICAL EXAM Mildly obese middle-aged African-American lady not in distress. . Afebrile. Head is nontraumatic. Neck is supple without bruit.    Cardiac exam no murmur or gallop. Lungs are clear to auscultation. Distal pulses are well felt. Neurological Exam ;  Awake  Alert oriented x 3. Normal speech and language.eye movements full without nystagmus.fundi were not visualized. Vision acuity and fields appear normal. Hearing is normal. Palatal movements are normal. Face asymmetric mild right nasolabial fold weakness.. Tongue midline. Normal strength, tone, reflexes and coordination.except diminished fine finger movements on the right. Orbits left over right upper extremity.chronic right foot drop with ankle dorsiflexors 4/5 weakness. Normal sensation. Gait deferred.   ASSESSMENT/PLA2N Rebekah Kennedy is a  63 y.o. female with history of diabetes, hypertension, hyperlipidemia, obesity, OSA on CPAP, stroke in 2012 presenting with unsteady gait, slurred speech and fall..   Stroke:   Small left thalamic infarct secondary to small vessel disease    CT head normal  MRI small left thalamic infarct.  Small vessel disease.  MRA no LVO.  Intracranial atherosclerosis.  Carotid Doppler B ICA 1-39% stenosis, VAs antegrade   2D Echo  Pending  LDL 81  HgbA1c pending   Lovenox 40 mg sq daily for VTE prophylaxis  clopidogrel 75 mg daily prior to admission, now on aspirin 81 mg daily and clopidogrel 75 mg daily.  Continue DAPT x3 weeks then Plavix alone.  Therapy recommendations:  HH OT  Disposition:  pending  Follow-up Stroke Clinic at [facility] in 4 weeks. Office will call with appointment date and time. Order placed.  Hypertension  Stable . Permissive hypertension (OK if < 220/120) but gradually normalize in 5-7 days . Long-term BP goal normotensive  Hyperlipidemia  Home meds:  crestor 10, resumed in hospital  LDL 81, goal < 70  Continue statin at discharge  Diabetes type II  HgbA1c pending , goal < 7.0  Other Stroke Risk Factors  UDS not performed  ETOH < 10  Overweight,  Body mass index is 28.46 kg/m. - hx bariatric surgery  Hx stroke/TIA  02/2011 - slurred speech, blurry vision and left-sided weakness.  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  MRA head and neck unremarkable.  Aspirin changed to Plavix.  Crestor added.  Mild residual left hemiparesis. (Dr. Thresa Ross neurology)  2012 2 TIA 1 week apart  Obstructive sleep apnea, has CPAP at home but does not routinely use   Other Active Problems  Asthma  GERD  Hospital day # [place], MSN, APRN, ANVP-BC, AGPCNP-BC Advanced Practice Stroke Nurse [place] for Schedule & Pager information 07/08/2018 2:12 PM  I have personally  obtained history,examined this patient, reviewed notes, independently viewed imaging studies, participated in medical decision making and plan of care.ROS completed by me personally and pertinent positives fully documented  I have made any additions or clarifications directly to the above note. Agree with note above. She presented with right-sided paresthesia and mild weakness of left thalamic infarct from small vessel disease. Recommend dual antiplatelet therapy for 3 weeks followed by Plavix alone. Continue ongoing stroke workup and aggressive risk factor modification. Greater than 50% time during this 35 minute visit was spent on counseling and coordination of care about her lacunar stroke in discussion about evaluation and treatment plan and answered questions Antony Contras, MD Medical Director [place] Pager: [phone] 07/08/2018 5:27 PM  To contact Stroke Continuity provider, please refer to [email or web address] After hours, contact General Neurology

## 2018-07-08 NOTE — Progress Notes (Signed)
SLP Cancellation Note  Patient Details Name: Rebekah Kennedy MRN: [id number] DOB: Sep 05, 1955   Cancelled treatment:       Reason Eval/Treat Not Completed: Patient at procedure or test/unavailable  Gabriel Rainwater MA, CCC-SLP   [name] [name] 07/08/2018, 10:39 AM

## 2018-07-08 NOTE — Discharge Summary (Signed)
Physician Discharge Summary  Rebekah Kennedy MHD:[id number] DOB: [birth year] DOA: 07/07/2018  PCP: Binnie Rail, MD  Admit date: 07/07/2018 Discharge date: 07/08/2018  Admitted From: Home Disposition: Home  Recommendations for Outpatient Follow-up:  1. Follow up with PCP in 1week 2. Outpatient follow-up with neurology 3. Follow-up in the ED if symptoms worsen or new appear   Home Health: Home health PT/OT Equipment/Devices: None  Discharge Condition: Stable CODE STATUS: Full Diet recommendation: Heart Healthy /diet as per SLP recommendations  Brief/Interim Summary: 63 year old female with history of stroke and TIAs, diabetes mellitus type 2, hypertension, asthma, depression presented with dizziness and difficulty walking with a fall.  She was admitted with acute stroke.  Neurology was consulted.  Neurology recommends aspirin and Plavix for 3 weeks then Plavix alone.  PT/OT recommended SNF.  Patient wants to go home and refused to go to SNF.  She will be discharged with home health PT/OT.  Discharge Diagnoses:  Principal Problem:   Stroke-like symptoms Active Problems:   Hyperlipidemia   Essential hypertension   History of TIA (transient ischemic attack)   Diabetes ([place])  Acute stroke -CT head on presentation was normal.  MRI showed small left thalamic infarct.  MRA was negative for large vessel occlusion -Carotid Doppler showed bilateral 1 to 39% ICA stenosis -Neurology evaluation appreciated.  Neurology recommends aspirin and Plavix for 3 weeks followed by Plavix alone -PT/OT recommended home health SNF placement but patient adamantly refused.  She will be discharged with home health PT/OT -2D echo report is pending -Outpatient follow-up with neurology  Hypertension -Blood pressure stable.  Antihypertensives on hold for now.  Will discharge on oral metoprolol and losartan and hold spironolactone and amlodipine on discharge.  Follow-up with PCP regarding resumption of these  medications  Hyperlipidemia -LDL 81.  Continue Crestor  Diabetes mellitus type 2 -Hemoglobin A1c pending.  Continue carb modified diet.  Outpatient follow-up.  Continue Januvia  GERD -Continue PPI    Discharge Instructions  Discharge Instructions    Ambulatory referral to Neurology   Complete by:  As directed    An appointment is requested in approximately:Stroke   Call MD for:  difficulty breathing, headache or visual disturbances   Complete by:  As directed    Call MD for:  extreme fatigue   Complete by:  As directed    Call MD for:  hives   Complete by:  As directed    Call MD for:  persistant dizziness or light-headedness   Complete by:  As directed    Call MD for:  persistant nausea and vomiting   Complete by:  As directed    Call MD for:  severe uncontrolled pain   Complete by:  As directed    Call MD for:  temperature >100.4   Complete by:  As directed    Diet - low sodium heart healthy   Complete by:  As directed    Increase activity slowly   Complete by:  As directed      Allergies as of 07/08/2018      Reactions   Quinapril Cough      Medication List    STOP taking these medications   amLODipine 5 MG tablet Commonly known as:  NORVASC   polyethylene glycol packet Commonly known as:  MIRALAX / GLYCOLAX   spironolactone 25 MG tablet Commonly known as:  ALDACTONE     TAKE these medications   albuterol 108 (90 Base) MCG/ACT inhaler Commonly known as:  PROVENTIL  HFA;VENTOLIN HFA Inhale 2 puffs into the lungs every 4 (four) hours as needed for wheezing or shortness of breath.   aspirin 81 MG EC tablet Take 1 tablet (81 mg total) by mouth daily. Start taking on:  July 09, 2018   Biotin 5000 MCG Tabs Take 5,000 mcg by mouth daily.   clopidogrel 75 MG tablet Commonly known as:  PLAVIX Take 1 tablet (75 mg total) by mouth daily.   losartan 100 MG tablet Commonly known as:  COZAAR TAKE 1 TAB BY MOUTH ONCE DAILY. What changed:  See the new  instructions.   metoprolol succinate 100 MG 24 hr tablet Commonly known as:  TOPROL-XL TAKE 1 TABLET BY MOUTH DAILY WITH OR IMMEDIATELY FOLLOWING A MEAL What changed:  See the new instructions.   montelukast 10 MG tablet Commonly known as:  SINGULAIR Take 1 tablet (10 mg total) by mouth at bedtime. What changed:  when to take this   OVER THE COUNTER MEDICATION Take 1 tablet by mouth daily. BariMelt   pantoprazole 40 MG tablet Commonly known as:  PROTONIX Take 40 mg by mouth daily.   rosuvastatin 10 MG tablet Commonly known as:  CRESTOR Take 1 tablet (10 mg total) by mouth daily.   sitaGLIPtin 100 MG tablet Commonly known as:  JANUVIA Take 1 tablet (100 mg total) by mouth daily.      Follow-up Information    Binnie Rail, MD. Schedule an appointment as soon as possible for a visit in 1 week(s).   Specialty:  Internal Medicine Contact information: [place] [address] [phone]          Allergies  Allergen Reactions  . Quinapril Cough    Consultations:  Neurology   Procedures/Studies: Ct Head Wo Contrast  Result Date: 07/07/2018 CLINICAL DATA:  Dizziness this morning. Fell at home. Patient is on Plavix. EXAM: CT HEAD WITHOUT CONTRAST TECHNIQUE: Contiguous axial images were obtained from the base of the skull through the vertex without intravenous contrast. COMPARISON:  Head CT, 04/24/2018 FINDINGS: Brain: No evidence of acute infarction, hemorrhage, hydrocephalus, extra-axial collection or mass lesion/mass effect. Vascular: No hyperdense vessel or unexpected calcification. Skull: Normal. Negative for fracture or focal lesion. Sinuses/Orbits: Globes and orbits are unremarkable. The visualized sinuses and mastoid air cells are clear. Other: None. IMPRESSION: Normal unenhanced CT scan of the brain. Electronically Signed   By: Lajean Manes M.D.   On: 07/07/2018 11:32   Mr Jodene Nam Head Wo Contrast  Result Date: 07/07/2018 CLINICAL DATA:  Focal neuro  deficit greater than 6 hours. Dizziness. Fall. EXAM: MRI HEAD WITHOUT CONTRAST MRA HEAD WITHOUT CONTRAST TECHNIQUE: Multiplanar, multiecho pulse sequences of the brain and surrounding structures were obtained without intravenous contrast. Angiographic images of the head were obtained using MRA technique without contrast. COMPARISON:  CT head 07/07/2018 FINDINGS: MRI HEAD FINDINGS Brain: Acute infarct in the left anterior thalamus measuring approximately 1 cm. No other acute infarct. Ventricle size normal. Mild chronic microvascular ischemic changes in the white matter. Negative for hemorrhage mass or fluid collection. Vascular: Normal arterial flow voids Skull and upper cervical spine: Negative Sinuses/Orbits: Paranasal sinuses clear. Bilateral cataract surgery. Other: None MRA HEAD FINDINGS Both vertebral arteries widely patent. PICA patent bilaterally. Basilar widely patent. Superior cerebellar and posterior cerebral arteries patent bilaterally. Fetal origin left posterior cerebral artery. Internal carotid artery patent bilaterally with mild stenosis on the right due to atherosclerotic irregularity. Anterior and middle cerebral arteries patent bilaterally. Mild to moderate stenosis proximal left  A1 segment. Mild to moderate stenosis proximal right M1 segment. Mild stenosis left M1 segment. No large vessel occlusion. Negative for aneurysm. IMPRESSION: Small area of acute infarction left anterior thalamus. Mild chronic microvascular ischemic changes in the white matter Intracranial atherosclerotic disease as above. Negative for emergent large vessel occlusion. Electronically Signed   By: Franchot Gallo M.D.   On: 07/07/2018 13:36   Mr Brain Wo Contrast  Result Date: 07/07/2018 CLINICAL DATA:  Focal neuro deficit greater than 6 hours. Dizziness. Fall. EXAM: MRI HEAD WITHOUT CONTRAST MRA HEAD WITHOUT CONTRAST TECHNIQUE: Multiplanar, multiecho pulse sequences of the brain and surrounding structures were obtained  without intravenous contrast. Angiographic images of the head were obtained using MRA technique without contrast. COMPARISON:  CT head 07/07/2018 FINDINGS: MRI HEAD FINDINGS Brain: Acute infarct in the left anterior thalamus measuring approximately 1 cm. No other acute infarct. Ventricle size normal. Mild chronic microvascular ischemic changes in the white matter. Negative for hemorrhage mass or fluid collection. Vascular: Normal arterial flow voids Skull and upper cervical spine: Negative Sinuses/Orbits: Paranasal sinuses clear. Bilateral cataract surgery. Other: None MRA HEAD FINDINGS Both vertebral arteries widely patent. PICA patent bilaterally. Basilar widely patent. Superior cerebellar and posterior cerebral arteries patent bilaterally. Fetal origin left posterior cerebral artery. Internal carotid artery patent bilaterally with mild stenosis on the right due to atherosclerotic irregularity. Anterior and middle cerebral arteries patent bilaterally. Mild to moderate stenosis proximal left A1 segment. Mild to moderate stenosis proximal right M1 segment. Mild stenosis left M1 segment. No large vessel occlusion. Negative for aneurysm. IMPRESSION: Small area of acute infarction left anterior thalamus. Mild chronic microvascular ischemic changes in the white matter Intracranial atherosclerotic disease as above. Negative for emergent large vessel occlusion. Electronically Signed   By: Franchot Gallo M.D.   On: 07/07/2018 13:36   Vas US Carotid (at [facility] Only)  Result Date: 07/08/2018 Carotid Arterial Duplex Study Indications: CVA. Performing Technologist: June Leap RDMS, RVT  Examination Guidelines: A complete evaluation includes B-mode imaging, spectral Doppler, color Doppler, and power Doppler as needed of all accessible portions of each vessel. Bilateral testing is considered an integral part of a complete examination. Limited examinations for reoccurring indications may be performed as noted.  Right  Carotid Findings: +----------+--------+--------+--------+-----------+--------+           PSV cm/sEDV cm/sStenosisDescribe   Comments +----------+--------+--------+--------+-----------+--------+ CCA Prox  109     12                                  +----------+--------+--------+--------+-----------+--------+ CCA Distal114     27                                  +----------+--------+--------+--------+-----------+--------+ ICA Prox  147     28      1-39%   homogeneous         +----------+--------+--------+--------+-----------+--------+ ICA Distal111     22                                  +----------+--------+--------+--------+-----------+--------+ ECA       130     20                                  +----------+--------+--------+--------+-----------+--------+ +----------+--------+-------+----------------+-------------------+  PSV cm/sEDV cmsDescribe        Arm Pressure (mmHG) +----------+--------+-------+----------------+-------------------+ Subclavian202            Multiphasic, WNL                    +----------+--------+-------+----------------+-------------------+ +---------+--------+--+--------+--+---------+ VertebralPSV cm/s60EDV cm/s18Antegrade +---------+--------+--+--------+--+---------+ Incidental findings: right thyroid nodule, mixed echo nodule, and solid hyperechoic lesion measuring 1.65 cm  Left Carotid Findings: +----------+--------+--------+--------+-----------+--------+           PSV cm/sEDV cm/sStenosisDescribe   Comments +----------+--------+--------+--------+-----------+--------+ CCA Prox  147     22                                  +----------+--------+--------+--------+-----------+--------+ CCA Distal116     22                                  +----------+--------+--------+--------+-----------+--------+ ICA Prox  80      25      1-39%   homogeneous          +----------+--------+--------+--------+-----------+--------+ ICA Distal123     34                                  +----------+--------+--------+--------+-----------+--------+ ECA       126     23                                  +----------+--------+--------+--------+-----------+--------+ +----------+--------+--------+----------------+-------------------+ SubclavianPSV cm/sEDV cm/sDescribe        Arm Pressure (mmHG) +----------+--------+--------+----------------+-------------------+           157             Multiphasic, WNL                    +----------+--------+--------+----------------+-------------------+ +---------+--------+--+--------+--+---------+ VertebralPSV cm/s74EDV cm/s18Antegrade +---------+--------+--+--------+--+---------+ Incidental findings: left thyroid solid lesion with mixed echoes measuring 2.71 cm.  Summary: Right Carotid: Velocities in the right ICA are consistent with a 1-39% stenosis. Left Carotid: Velocities in the left ICA are consistent with a 1-39% stenosis. Vertebrals: Bilateral vertebral arteries demonstrate antegrade flow. *See table(s) above for measurements and observations.     Preliminary     Echo pending  Subjective: Patient seen and examined at bedside.  She feels better.  No overnight fever, nausea, vomiting or worsening weakness.  She thinks she will be able to go home today.  Discharge Exam: Vitals:   07/08/18 0542 07/08/18 0808  BP:    Pulse:    Resp:    Temp: 98.2 F (36.8 C) 98.2 F (36.8 C)  SpO2:     Vitals:   07/08/18 0130 07/08/18 0330 07/08/18 0542 07/08/18 0808  BP: 116/68 112/71    Pulse: 66 65    Resp: 19 (!) 21    Temp: 98.3 F (36.8 C) 98.4 F (36.9 C) 98.2 F (36.8 C) 98.2 F (36.8 C)  TempSrc: Oral Oral Oral Oral  SpO2: 96% 97%    Weight:      Height:        General: Pt is alert, awake, not in acute distress Cardiovascular: rate controlled, S1/S2 + Respiratory: bilateral decreased breath  sounds at bases Abdominal: Soft, NT, ND, bowel sounds + Extremities: no edema, no cyanosis  CNS: Alert awake, moving extremities.    The results of significant diagnostics from this hospitalization (including imaging, microbiology, ancillary and laboratory) are listed below for reference.     Microbiology: No results found for this or any previous visit (from the past 240 hour(s)).   Labs: BNP (last 3 results) No results for input(s): BNP in the last 8760 hours. Basic Metabolic Panel: Recent Labs  Lab 07/07/18 1040 07/08/18 0419  NA 139 138  K 4.2 4.3  CL 101 102  CO2 29 28  GLUCOSE 261* 152*  BUN 17 17  CREATININE 0.75 0.85  CALCIUM 9.4 9.3   Liver Function Tests: Recent Labs  Lab 07/07/18 1040 07/08/18 0419  AST 21 18  ALT 36 25  ALKPHOS 101 72  BILITOT 0.9 0.5  PROT 7.6 6.0*  ALBUMIN 4.1 3.1*   No results for input(s): LIPASE, AMYLASE in the last 168 hours. No results for input(s): AMMONIA in the last 168 hours. CBC: Recent Labs  Lab 07/07/18 1040 07/08/18 0419  WBC 10.5 8.1  NEUTROABS 6.5  --   HGB 13.3 12.7  HCT 42.1 38.3  MCV 88.8 84.9  PLT 302 276   Cardiac Enzymes: No results for input(s): CKTOTAL, CKMB, CKMBINDEX, TROPONINI in the last 168 hours. BNP: Invalid input(s): POCBNP CBG: Recent Labs  Lab 07/07/18 1010 07/07/18 1638 07/07/18 2211 07/08/18 0801 07/08/18 1215  GLUCAP 270* 135* 224* 165* 233*   D-Dimer No results for input(s): DDIMER in the last 72 hours. Hgb A1c No results for input(s): HGBA1C in the last 72 hours. Lipid Profile Recent Labs    07/08/18 0419  CHOL 176  HDL 62  LDLCALC 81  TRIG 163*  CHOLHDL 2.8   Thyroid function studies No results for input(s): TSH, T4TOTAL, T3FREE, THYROIDAB in the last 72 hours.  Invalid input(s): FREET3 Anemia work up No results for input(s): VITAMINB12, FOLATE, FERRITIN, TIBC, IRON, RETICCTPCT in the last 72 hours. Urinalysis    Component Value Date/Time   COLORURINE  YELLOW 09/22/2017 2243   APPEARANCEUR HAZY (A) 09/22/2017 2243   LABSPEC 1.016 09/22/2017 2243   PHURINE 7.0 09/22/2017 2243   GLUCOSEU NEGATIVE 09/22/2017 2243   GLUCOSEU >=1000 (A) 06/21/2014 1549   HGBUR SMALL (A) 09/22/2017 2243   BILIRUBINUR NEGATIVE 09/22/2017 2243   BILIRUBINUR small 03/02/2014 1703   KETONESUR 80 (A) 09/22/2017 2243   PROTEINUR 100 (A) 09/22/2017 2243   UROBILINOGEN 0.2 06/21/2014 1549   NITRITE NEGATIVE 09/22/2017 2243   LEUKOCYTESUR LARGE (A) 09/22/2017 2243   Sepsis Labs Invalid input(s): PROCALCITONIN,  WBC,  LACTICIDVEN Microbiology No results found for this or any previous visit (from the past 240 hour(s)).   Time coordinating discharge: 35 minutes  SIGNED:   Aline August, MD  [facility] 07/08/2018, 2:45 PM Pager: [phone]  If 7PM-7AM, please contact night-coverage [email or web address] Password [email or web address]

## 2018-07-08 NOTE — Evaluation (Signed)
Physical Therapy Evaluation Patient Details Name: Rebekah Kennedy MRN: [id number] DOB: [birth year] Today's Date: 07/08/2018   History of Present Illness  This 63 y.o. female admitted with  slurred speech, unsteady gait, and a fall at work.  MRI showed Lt thalamic infarct.  PMH includes:  DM, HTN, hyperlipidemia, obesity, OSA on CPAP, h/o CVA 2012, depression, neuropathy, retinal edema (gets steroid injections)   Clinical Impression   Pt received in chair, willing to participate in therapy, daughter in room. Completed vision screen- WNL; sensation WNL. 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 Sit to stand with min guard, ambulation with min guard to minA for instances of LOB and R knee buckling. Pt mostly able to self-correct both after LOB and knee buckle, with minA from PT to ensure safety. Pt reports knee does not buckle at baseline, but suspect this may be partially due to lack of AFO.  Discussed with family and pt will be able to stay with daughter at DC. Pt has been refusing SNF. Recommending HHPT and supervision for mobility to ensure pt's safety. Also discussed with family having 24 hour supervision for pt. Recommending RW because pt's balance is poor. Pt will continue to benefit from skilled acute PT while in the hospital.    Follow Up Recommendations Home health PT;Supervision for mobility/OOB    Equipment Recommendations  Rolling walker with 5" wheels;3in1 (PT)    Recommendations for Other Services       Precautions / Restrictions Precautions Precautions: Fall Precaution Comments: Pt states that she has an AFO for R foot but does not have it, does not always wear it. Restrictions Weight Bearing Restrictions: No      Mobility  Bed Mobility     General bed mobility comments: did not assess- pt received in chair  Transfers Overall transfer level: Needs assistance Equipment used: Rolling walker (2  wheeled) Transfers: Sit to/from Stand Sit to Stand: Min guard         General transfer comment: Pt able to stand from chair without physical assist, min guard for safety, v/c for hand placement. Stand to sit with v/c for backing up all the way to chair as pt began to sit before at chair.  Ambulation/Gait Ambulation/Gait assistance: Min guard;Min assist Gait Distance (Feet): 75 Feet Assistive device: Rolling walker (2 wheeled) Gait Pattern/deviations: Step-through pattern;Decreased dorsiflexion - right Gait velocity: decreased   General Gait Details: Pt able to ambulate using RW 75 ft into hallway, no R foot AFO. A couple instances of LOB posterior with minA from PT and mostly self-corrected to regain upright. Also multiple instances that increased in frequency with fatigue of R knee buckling. Pt reports this is a new impairment.  Stairs            Wheelchair Mobility    Modified Rankin (Stroke Patients Only) Modified Rankin (Stroke Patients Only) Pre-Morbid Rankin Score: No significant disability Modified Rankin: Moderate disability     Balance Overall balance assessment: Needs assistance;History of Falls Sitting-balance support: No upper extremity supported;Feet unsupported Sitting balance-Leahy Scale: Good     Standing balance support: Bilateral upper extremity supported;During functional activity Standing balance-Leahy Scale: Poor Standing balance comment: Pt required UE support for ambulation, a few instances of slight LOB in standing and during ambulation.  Pertinent Vitals/Pain Pain Assessment: No/denies pain    Home Living Family/patient expects to be discharged to:: Private residence Living Arrangements: Alone Available Help at Discharge: Family;Available PRN/intermittently Type of Home: House Home Access: Stairs to enter Entrance Stairs-Rails: Can reach both Entrance Stairs-Number of Steps: 4 Home Layout: One  level Home Equipment: Cane - single point Additional Comments: Pt and daughter say that pt is willing to go stay with daughter, who has steps to enter with railings.    Prior Function Level of Independence: Independent         Comments: Pt reports she uses her cane when she is walking outside on uneven surfaces but not when indoors.     Hand Dominance   Dominant Hand: Right    Extremity/Trunk Assessment   Upper Extremity Assessment Upper Extremity Assessment: Defer to OT evaluation    Lower Extremity Assessment Lower Extremity Assessment: RLE deficits/detail RLE Deficits / Details: R foot drop with walking- DF 3/5; R knee buckling with walking but quad 4/5 upon testing RLE Sensation: WNL RLE Coordination: WNL    Cervical / Trunk Assessment Cervical / Trunk Assessment: Normal  Communication   Communication: Expressive difficulties(slurred speech)  Cognition Arousal/Alertness: Awake/alert Behavior During Therapy: Impulsive Overall Cognitive Status: Impaired/Different from baseline Area of Impairment: Attention;Following commands;Safety/judgement;Awareness;Problem solving                   Current Attention Level: Selective   Following Commands: Follows one step commands consistently Safety/Judgement: Decreased awareness of safety Awareness: Intellectual Problem Solving: Slow processing;Requires verbal cues General Comments: Pt somewhat impulsive but responds to v/c for safety. She acknowledges PT recommendations for safety.      General Comments General comments (skin integrity, edema, etc.): daughter present during eval     Exercises     Assessment/Plan    PT Assessment Patient needs continued PT services  PT Problem List Decreased strength;Decreased mobility;Decreased safety awareness;Decreased coordination;Decreased balance;Decreased knowledge of use of DME;Decreased activity tolerance       PT Treatment Interventions DME instruction;Functional  mobility training;Balance training;Patient/family education;Gait training;Neuromuscular re-education;Therapeutic activities;Stair training;Therapeutic exercise;Cognitive remediation    PT Goals (Current goals can be found in the Care Plan section)  Acute Rehab PT Goals Patient Stated Goal: "I want to go home to my dogs"  PT Goal Formulation: With patient Time For Goal Achievement: 07/22/18 Potential to Achieve Goals: Good    Frequency Min 3X/week   Barriers to discharge        Co-evaluation               AM-PAC PT "6 Clicks" Mobility  Outcome Measure Help needed turning from your back to your side while in a flat bed without using bedrails?: A Little Help needed moving from lying on your back to sitting on the side of a flat bed without using bedrails?: A Little Help needed moving to and from a bed to a chair (including a wheelchair)?: A Little Help needed standing up from a chair using your arms (e.g., wheelchair or bedside chair)?: None Help needed to walk in hospital room?: A Little Help needed climbing 3-5 steps with a railing? : A Lot 6 Click Score: 18    End of Session Equipment Utilized During Treatment: Gait belt Activity Tolerance: Patient tolerated treatment well Patient left: in chair;with call bell/phone within reach;with chair alarm set;with family/visitor present   PT Visit Diagnosis: Unsteadiness on feet (R26.81);History of falling (Z91.81);Other symptoms and signs involving the nervous system (R29.898)    Time:  -  Charges:              Ronnell Guadalajara, SPT   Ronnell Guadalajara 07/08/2018, 3:49 PM

## 2018-07-08 NOTE — Progress Notes (Signed)
Carotid duplex       has been completed. Preliminary results can be found under CV proc through chart review. Alistar Mcenery, BS, RDMS, RVT   

## 2018-07-08 NOTE — Progress Notes (Signed)
Discharge instructions reviewed with patient and daughter, with whom patient will live (at least for a time).  These included, but were not limited to, the following:  prescriptions and medications, dietary recommendations, activity recommendations and potential restrictions, Stroke sx and management, emergency response, etc.  Son here to drive patient to her daughter's home.  Escorted to exit via wheelchair accompanied by NT.

## 2018-07-09 ENCOUNTER — Encounter: Payer: Self-pay | Admitting: Internal Medicine

## 2018-07-09 DIAGNOSIS — I739 Peripheral vascular disease, unspecified: Secondary | ICD-10-CM

## 2018-07-09 DIAGNOSIS — I779 Disorder of arteries and arterioles, unspecified: Secondary | ICD-10-CM | POA: Insufficient documentation

## 2018-07-09 LAB — HEMOGLOBIN A1C
Hgb A1c MFr Bld: 8.2 % — ABNORMAL HIGH (ref 4.8–5.6)
Mean Plasma Glucose: 189 mg/dL

## 2018-07-09 NOTE — Telephone Encounter (Signed)
Ok for verbals 

## 2018-07-09 NOTE — Telephone Encounter (Signed)
advised Rebekah Kennedy/[facility] ok for verbal orders

## 2018-07-09 NOTE — Telephone Encounter (Signed)
Routing to dr john, please advise in the absence of dr burns, thanks 

## 2018-07-10 ENCOUNTER — Ambulatory Visit: Payer: Self-pay | Admitting: *Deleted

## 2018-07-10 NOTE — Telephone Encounter (Signed)
Pt's daughter Enis Gash called stating that the pt is having confusion, and episodes of crying x 2 episodes in the past 24 hours; she states that her confusion started 07/09/2018 (pt having emotional breakdown); she also reports that crys randomly, has a hard time remembering things; she is concerned for the pt's safety; she also says that the pt was trying to drive even though she is confused; unable to give recommendations because pt's daughter's phone disconnected; will attempt to contact pt's daughter; pt normally seen by Dr Quay Burow, [place]; will also route to office for notification of this encounter.   Reason for Disposition . Very strange or paranoid behavior  Answer Assessment - Initial Assessment Questions 1. LEVEL OF CONSCIOUSNESS: "How is he (she, the patient) acting right now?" (e.g., alert-oriented, confused, lethargic, stuporous, comatose)     confused 2. ONSET: "When did the confusion start?"  (minutes, hours, days)     S/19/2020; pt recently released from hospital due to stroke 3. PATTERN "Does this come and go, or has it been constant since it started?"  "Is it present now?"     continuous 4. ALCOHOL or DRUGS: "Has he been drinking alcohol or taking any drugs?"      no 5. NARCOTIC MEDICATIONS: "Has he been receiving any narcotic medications?" (e.g., morphine, Vicodin)     no 6. CAUSE: "What do you think is causing the confusion?"      "PBA" pseudobulbar effect 7. OTHER SYMPTOMS: "Are there any other symptoms?" (e.g., difficulty breathing, headache, fever, weakness)     Depression, carelessness, forgetfulness, irritability  Protocols used: CONFUSION - DELIRIUM-A-AH

## 2018-07-10 NOTE — Telephone Encounter (Signed)
Pt's daughter Enis Gash called back; recommendations given per nurse triage protocol; she verbalizes understanding and will take the pt to the ED.

## 2018-07-10 NOTE — Telephone Encounter (Signed)
Attempted to contact pt's daughter Enis Gash; left message on voicemail [phone].

## 2018-07-11 ENCOUNTER — Telehealth: Payer: Self-pay | Admitting: Internal Medicine

## 2018-07-11 NOTE — Telephone Encounter (Signed)
Routing to dr burns, please advise, thanks 

## 2018-07-11 NOTE — Telephone Encounter (Signed)
Copied from [place] [id number]. Topic: Quick Communication - Home Health Verbal Orders >> Jul 11, 2018 12:41 PM Carolyn Stare wrote: Caller/Agency Charise a PT with [facility]    Callback Number  [phone]   Requesting  [facility] FOR SPEECH THERAPY EVAL   Frequency

## 2018-07-12 DIAGNOSIS — I6381 Other cerebral infarction due to occlusion or stenosis of small artery: Secondary | ICD-10-CM | POA: Insufficient documentation

## 2018-07-12 DIAGNOSIS — I639 Cerebral infarction, unspecified: Secondary | ICD-10-CM | POA: Insufficient documentation

## 2018-07-12 DIAGNOSIS — I6930 Unspecified sequelae of cerebral infarction: Secondary | ICD-10-CM | POA: Insufficient documentation

## 2018-07-12 NOTE — Telephone Encounter (Signed)
ok 

## 2018-07-12 NOTE — Progress Notes (Signed)
Subjective:    Patient ID: Rebekah Kennedy, female    DOB: 1955-06-17, 63 y.o.   MRN: [id number]  HPI The patient is here for follow up from the hospital.  She is here today with her daughter.  Admitted 2/17 - 07/08/18.  She went to the ED for dizziness and difficulty walking with a fall.  Her symptoms started that morning.  She woke up with her symptoms - she was stumbling and felt dizzy.  She did fall at home.  She went to work and an ambulance was called because of her symptoms.  She denied slurred speech, facial droop, fever, cough, chest pain, SOB.  She has chronic peripheral neuropathy and that causes her to fall sometimes.    CT in the ED was normal.  MRI showed small left thalamic infarct.  MRA neg for large occlusion.  Neurology was consulted and she was admitted for acute stroke.  Acute stroke, small left thalamic infarct: Ct neg, MRI showed small infarct.  MRA neg for large vessel occlusion Carotid dopplers - b/l 1-39% ICA stenosis Neurology consulted and advised ASA and plavix x 3 weeks then plavix alone She does have residual right arm and leg weakness PT/OT recommended home health SNF placement but pt refused.   2D Echo - normal EF, mld lvh, normal RV Follow up with neurology as outpatient Discharged home with home PT/OT  Hypertension: BP stable BP meds help initially - restarted metoprolol and losartan on d/c, spironolactone and amlodipine held on discharge  Hyperlipidemia: LDL was 81 crestor continued  Diabetes: Lab Results  Component Value Date   HGBA1C 8.2 (H) 07/08/2018  januvia continued  GERD: PPI continued    Acute stroke, left thalamic infarct: She has right arm and leg weakness.  She has home OT/PT twice a week.  She is struggling with the walker and her duaghter is concerned she will fall.  She has difficulty getting up out of a chair.  She has difficulty writing.  She had some confusion thr first 1-2 days after discharge.  She was frustrated and  she denies confusion, but her daughter thought she was confused on a few occasions.  She has been fine the past couple of days.  Both her and her daughter do not feel that her current physical and occupational therapy are enough and at this point they both feel that going to rehab is ideal.  She is taking her medication as prescribed, but admits that she did not take her blood pressure medications today and believes that is why her blood pressure is elevated.  Hypertension: She did not take her losartan and metoprolol today.  She has been taking it every day, but was in a rush this morning.  She has not been monitoring her blood pressure at home.  Physical therapy did check her blood pressure when they came last week and she states it was good, but does not recall the number.  She denies any chest pain, palpitations and leg swelling.  Diabetes: Her last A1c while in the hospital was 8.2%.  She is taking the Januvia daily.  She has not been compliant with a low sugar/carbohydrate diet and has not been exercising.  Medications and allergies reviewed with patient and updated if appropriate.  Patient Active Problem List   Diagnosis Date Noted  . Left thalamic infarction ([place]) 07/12/2018  . Carotid artery disease ([place]) 07/09/2018  . Stroke-like symptoms 07/07/2018  . Numbness and tingling 12/17/2017  . Right foot  drop 12/17/2017  . Atherosclerosis of aorta ([place]) 10/09/2017  . AKI (acute kidney injury) ([place])   . Bariatric surgery status, 07/2017 08/19/2017  . Constipation 08/19/2017  . Hypertensive urgency 05/23/2017  . Greater trochanteric bursitis of both hips 12/19/2016  . OSA (obstructive sleep apnea) 07/26/2016  . Hypersomnia 06/11/2016  . Diabetes ([place]) 07/19/2015  . History of colonic polyps 12/01/2014  . Morbidly obese ([place]) 10/05/2013  . Partial nontraumatic tear of right rotator cuff 09/21/2013  . Goiter 12/10/2011  . Asthma 12/10/2011  . 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  History of TIA (transient ischemic attack) 02/13/2011  . PROTEINURIA, MILD 09/15/2009  . Hyperlipidemia 12/01/2006  . Essential hypertension 07/07/2006    Current Outpatient Medications on File Prior to Visit  Medication Sig Dispense Refill  . albuterol (PROVENTIL HFA;VENTOLIN HFA) 108 (90 Base) MCG/ACT inhaler Inhale 2 puffs into the lungs every 4 (four) hours as needed for wheezing or shortness of breath. 18 g 2  . aspirin EC 81 MG EC tablet Take 1 tablet (81 mg total) by mouth daily. 30 tablet 0  . Biotin 5000 MCG TABS Take 5,000 mcg by mouth daily.     . clopidogrel (PLAVIX) 75 MG tablet Take 1 tablet (75 mg total) by mouth daily. 90 tablet 3  . losartan (COZAAR) 100 MG tablet TAKE 1 TAB BY MOUTH ONCE DAILY. (Patient taking differently: Take 100 mg by mouth daily. ) 90 tablet 1  . metoprolol succinate (TOPROL-XL) 100 MG 24 hr tablet TAKE 1 TABLET BY MOUTH DAILY WITH OR IMMEDIATELY FOLLOWING A MEAL (Patient taking differently: Take 100 mg by mouth daily. ) 90 tablet 1  . montelukast (SINGULAIR) 10 MG tablet Take 1 tablet (10 mg total) by mouth at bedtime. (Patient taking differently: Take 10 mg by mouth daily. ) 90 tablet 3  . OVER THE COUNTER MEDICATION Take 1 tablet by mouth daily. BariMelt    . pantoprazole (PROTONIX) 40 MG tablet Take 40 mg by mouth daily.  2  . rosuvastatin (CRESTOR) 10 MG tablet Take 1 tablet (10 mg total) by mouth daily. 90 tablet 3  . sitaGLIPtin (JANUVIA) 100 MG tablet Take 1 tablet (100 mg total) by mouth daily. 30 tablet 11   No current facility-administered medications on file prior to visit.     Past Medical History:  Diagnosis Date  . ASTHMA NOS W/ACUTE EXACERBATION 07/10/2010  . Cervical dysplasia   . DEPRESSION 03/16/2007  . DIABETES MELLITUS, TYPE II 12/01/2006  . Dysmenorrhea   . Fibroid   . HYPERLIPIDEMIA 12/01/2006  . HYPERTENSION 07/07/2006  . Neuropathy   . Obesity   . Osteopenia 05/2017   T score -1.5 FRAX 2.6% / 0.1%  . Retinal edema     gets steriod inj in the eyes    . Sleep apnea    HAS CPAP BUT ADMITS DOES NOT USE JUDICIALLY   . Splenomegaly    in college  . [facility]   R pontine, mild residual left hemiparesis  . TIA (transient ischemic attack) 2012   2 TIAs 1 week apart of each other     Past Surgical History:  Procedure Laterality Date  . 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 APPENDECTOMY    . BIOPSY THYROID  05/02/11   Nonneoplastic goiter  . BREAST BIOPSY    . BREAST LUMPECTOMY WITH RADIOACTIVE SEED LOCALIZATION Left 07/25/2017   Procedure: LEFT BREAST LUMPECTOMY WITH RADIOACTIVE SEED LOCALIZATION;  Surgeon: Coralie Keens, MD;  Location: [facility] OR;  Service: General;  Laterality: Left;  . BREAST SURGERY     Reduction  . COLPOSCOPY    . DILATION AND CURETTAGE OF UTERUS  1975   DUB  . EYE SURGERY     Laser  . GASTRIC ROUX-EN-Y N/A 08/12/2017   Procedure: LAPAROSCOPIC ROUX-EN-Y GASTRIC BYPASS WITH HIATAL HERNIA REPAIR AND UPPER ENDOSCOPY;  Surgeon: [name], [name], MD;  Location: [facility] ORS;  Service: General;  Laterality: N/A;  . GYNECOLOGIC CRYOSURGERY    . MYOMECTOMY    . OVARIAN CYST REMOVAL    . PELVIC LAPAROSCOPY  75,88   DL lysis of adhesions    Social History   Socioeconomic History  . Marital status: Single    Spouse name: Not on file  . Number of children: 2  . Years of education: 16  . Highest education level: Bachelor's degree (e.g., BA, AB, BS)  Occupational History  . Occupation: Product manager: [employer]  . Financial resource strain: Not on file  . Food insecurity:    Worry: Not on file    Inability: Not on file  . Transportation needs:    Medical: Not on file    Non-medical: Not on file  Tobacco Use  . Smoking status: Never Smoker  . Smokeless tobacco: Never Used  Substance and Sexual Activity  . Alcohol use: Yes    Alcohol/week: 0.0 standard drinks    Comment: very rare  . Drug use: No  . Sexual activity: Not Currently    Birth  control/protection: Post-menopausal    Comment: 1st intercourse 63 yo-Fewer than 5 partners  Lifestyle  . Physical activity:    Days per week: Not on file    Minutes per session: Not on file  . Stress: Not on file  Relationships  . Social connections:    Talks on phone: Not on file    Gets together: Not on file    Attends religious service: Not on file    Active member of club or organization: Not on file    Attends meetings of clubs or organizations: Not on file    Relationship status: Not on file  Other Topics Concern  . 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 Cancer Maternal Grandmother        Colon Cancer  . Asthma Maternal Grandmother   . Diabetes Father   . Heart disease Father   . Hypertension Father   . Hyperlipidemia Father   . Hypertension Mother   . Heart disease Mother   . Ovarian cancer Mother   . Cancer Mother        Lung cancer  . Asthma Mother   . COPD Mother   . Hyperlipidemia Mother   . Diabetes Brother   . Hypertension Brother   . Kidney disease Brother   . Asthma Brother   . Heart disease Brother   . Hyperlipidemia Brother   . Cancer Brother        Prostate  . Graves' disease Sister   . Diabetes Sister   . Breast cancer Sister 15  . Graves' disease Paternal Grandmother   . Hypertension Paternal Grandmother   . Heart disease Paternal Grandmother   . Alzheimer's disease Paternal Grandmother   . Cancer Maternal  Grandfather   . Heart failure Brother   . Heart failure Brother     Review of Systems  Constitutional: Negative for chills and fever.  HENT: Negative for trouble swallowing.   Eyes: Negative for visual disturbance.  Respiratory: Negative for cough, shortness of breath and wheezing.   Cardiovascular: Negative for chest pain, palpitations and leg swelling.  Gastrointestinal: Negative for  abdominal pain and nausea.       No gerd  Neurological: Positive for speech difficulty (little more laborious), weakness (right side), numbness (feet - left is worse) and headaches (occ - nothing new). Negative for dizziness.       Objective:   Vitals:   07/14/18 1018  BP: (!) 158/78  Pulse: 98  Resp: 16  Temp: 98.4 F (36.9 C)  SpO2: 99%   BP Readings from Last 3 Encounters:  07/14/18 (!) 158/78  07/08/18 112/74  04/24/18 (!) 165/71   Wt Readings from Last 3 Encounters:  07/14/18 175 lb 12.8 oz (79.7 kg)  07/07/18 175 lb (79.4 kg)  04/24/18 177 lb 14.6 oz (80.7 kg)   Body mass index is 28.59 kg/m.   Physical Exam    Constitutional: Appears well-developed and well-nourished. No distress.  HENT:  Head: Normocephalic and atraumatic.  Neck: Neck supple. No tracheal deviation present. No thyromegaly present.  No cervical lymphadenopathy Cardiovascular: Normal rate, regular rhythm and normal heart sounds.   No murmur heard. No carotid bruit .  No edema Pulmonary/Chest: Effort normal and breath sounds normal. No respiratory distress. No has no wheezes. No rales. Neurologic: Mild right hand weakness 4/5, minimal weakness right lower extremity, normal sensation bilateral upper extremities and lower extremities, cranial nerves intact Skin: Skin is warm and dry. Not diaphoretic.  Psychiatric: Normal mood and affect. Behavior is normal.    VAS US CAROTID (at [facility] and [facility] only) Carotid Arterial Duplex Study  Indications: CVA.  Performing Technologist: June Leap RDMS, RVT    Examination Guidelines: A complete evaluation includes B-mode imaging, spectral Doppler, color Doppler, and power Doppler as needed of all accessible portions of each vessel. Bilateral testing is considered an integral part of a complete examination. Limited examinations for reoccurring indications may be performed as noted.    Right Carotid  Findings: +----------+--------+--------+--------+-----------+--------+           PSV cm/sEDV cm/sStenosisDescribe   Comments +----------+--------+--------+--------+-----------+--------+ CCA Prox  109     12                                  +----------+--------+--------+--------+-----------+--------+ CCA Distal114     27                                  +----------+--------+--------+--------+-----------+--------+ ICA Prox  147     28      1-39%   homogeneous         +----------+--------+--------+--------+-----------+--------+ ICA Distal111     22                                  +----------+--------+--------+--------+-----------+--------+ ECA       130     20                                  +----------+--------+--------+--------+-----------+--------+  +----------+--------+-------+----------------+-------------------+  PSV cm/sEDV cmsDescribe        Arm Pressure (mmHG) +----------+--------+-------+----------------+-------------------+ Subclavian202            Multiphasic, WNL                    +----------+--------+-------+----------------+-------------------+  +---------+--------+--+--------+--+---------+ VertebralPSV cm/s60EDV cm/s18Antegrade +---------+--------+--+--------+--+---------+ Incidental findings: right thyroid nodule, mixed echo nodule, and solid hyperechoic lesion measuring 1.65 cm   Left Carotid Findings: +----------+--------+--------+--------+-----------+--------+           PSV cm/sEDV cm/sStenosisDescribe   Comments +----------+--------+--------+--------+-----------+--------+ CCA Prox  147     22                                  +----------+--------+--------+--------+-----------+--------+ CCA Distal116     22                                  +----------+--------+--------+--------+-----------+--------+ ICA Prox  80      25      1-39%   homogeneous          +----------+--------+--------+--------+-----------+--------+ ICA Distal123     34                                  +----------+--------+--------+--------+-----------+--------+ ECA       126     23                                  +----------+--------+--------+--------+-----------+--------+  +----------+--------+--------+----------------+-------------------+ SubclavianPSV cm/sEDV cm/sDescribe        Arm Pressure (mmHG) +----------+--------+--------+----------------+-------------------+           157             Multiphasic, WNL                    +----------+--------+--------+----------------+-------------------+  +---------+--------+--+--------+--+---------+ VertebralPSV cm/s74EDV cm/s18Antegrade +---------+--------+--+--------+--+---------+  Incidental findings: left thyroid solid lesion with mixed echoes measuring 2.71 cm.   Summary: Right Carotid: Velocities in the right ICA are consistent with a 1-39% stenosis.  Left Carotid: Velocities in the left ICA are consistent with a 1-39% stenosis.  Vertebrals: Bilateral vertebral arteries demonstrate antegrade flow.  *See table(s) above for measurements and observations.    Electronically signed by Antony Contras MD on 07/09/2018 at 2:05:22 PM.      Final     Lab Results  Component Value Date   WBC 8.1 07/08/2018   HGB 12.7 07/08/2018   HCT 38.3 07/08/2018   PLT 276 07/08/2018   GLUCOSE 152 (H) 07/08/2018   CHOL 176 07/08/2018   TRIG 163 (H) 07/08/2018   HDL 62 07/08/2018   LDLDIRECT 126.7 03/24/2013   LDLCALC 81 07/08/2018   ALT 25 07/08/2018   AST 18 07/08/2018   NA 138 07/08/2018   K 4.3 07/08/2018   CL 102 07/08/2018   CREATININE 0.85 07/08/2018   BUN 17 07/08/2018   CO2 28 07/08/2018   TSH 1.471 05/24/2017   INR 0.96 07/07/2018   HGBA1C 8.2 (H) 07/08/2018   MICROALBUR 14.5 (H) 09/16/2013    Assessment & Plan:    See Problem List for Assessment and Plan of chronic medical  problems.

## 2018-07-14 ENCOUNTER — Other Ambulatory Visit (HOSPITAL_COMMUNITY): Payer: Self-pay | Admitting: Internal Medicine

## 2018-07-14 ENCOUNTER — Telehealth: Payer: Self-pay | Admitting: Internal Medicine

## 2018-07-14 ENCOUNTER — Ambulatory Visit: Payer: BC Managed Care – PPO | Admitting: Internal Medicine

## 2018-07-14 ENCOUNTER — Encounter: Payer: Self-pay | Admitting: Internal Medicine

## 2018-07-14 VITALS — BP 158/78 | HR 98 | Temp 98.4°F | Resp 16 | Ht 65.75 in | Wt 175.8 lb

## 2018-07-14 DIAGNOSIS — I639 Cerebral infarction, unspecified: Secondary | ICD-10-CM

## 2018-07-14 DIAGNOSIS — R531 Weakness: Secondary | ICD-10-CM

## 2018-07-14 DIAGNOSIS — E7849 Other hyperlipidemia: Secondary | ICD-10-CM | POA: Diagnosis not present

## 2018-07-14 DIAGNOSIS — I6930 Unspecified sequelae of cerebral infarction: Secondary | ICD-10-CM | POA: Insufficient documentation

## 2018-07-14 DIAGNOSIS — I10 Essential (primary) hypertension: Secondary | ICD-10-CM

## 2018-07-14 DIAGNOSIS — Z794 Long term (current) use of insulin: Secondary | ICD-10-CM

## 2018-07-14 DIAGNOSIS — I6381 Other cerebral infarction due to occlusion or stenosis of small artery: Secondary | ICD-10-CM

## 2018-07-14 DIAGNOSIS — E1151 Type 2 diabetes mellitus with diabetic peripheral angiopathy without gangrene: Secondary | ICD-10-CM | POA: Diagnosis not present

## 2018-07-14 MED ORDER — DAPAGLIFLOZIN PROPANEDIOL 5 MG PO TABS: 5.0000 mg | ORAL_TABLET | Freq: Every day | ORAL | 5 refills | Status: DC

## 2018-07-14 NOTE — Assessment & Plan Note (Signed)
Continue Crestor 10 mg daily. 

## 2018-07-14 NOTE — Telephone Encounter (Signed)
FYI

## 2018-07-14 NOTE — Assessment & Plan Note (Signed)
Blood pressure elevated here today, but she did not take her losartan or metoprolol.  Spironolactone and amlodipine discontinued while in the hospital Advised her to start monitoring her blood pressure herself and to record the readings from physical therapy Discussed the importance of taking her medication on a daily basis No change in medication-continue losartan and metoprolol at current doses

## 2018-07-14 NOTE — Patient Instructions (Addendum)
   Medications reviewed and updated.  Changes include :   Start Farxiga 5 mg daily.   Your prescription(s) have been submitted to your pharmacy. Please take as directed and contact our office if you believe you are having problem(s) with the medication(s).  A referral was ordered for social work   Please followup in 3 months

## 2018-07-14 NOTE — Assessment & Plan Note (Signed)
Secondary to acute small left thalamic infarct Doing home PT/OT, but home PT and hospital PT as well as family thought she should do rehab and she now agrees that she should have had agreed to do rehab-she is interested in trying to get into rehab now Referral order for social work

## 2018-07-14 NOTE — Telephone Encounter (Signed)
Copied from [place] [id number]. Topic: Quick Communication - See Telephone Encounter >> Jul 14, 2018  3:00 PM Vernona Rieger wrote: CRM for notification. See Telephone encounter for: 07/14/18.  Lennette Bihari with [facility] called to let Dr Quay Burow know that there has been a delay in occupational therapy until tomorrow. No call back needed.

## 2018-07-14 NOTE — Telephone Encounter (Signed)
Advised Rebekah Kennedy of ok orders from dr burns

## 2018-07-14 NOTE — Assessment & Plan Note (Addendum)
MRI confirmed a small left thalamic infarct, MRA negative for large occlusion, carotid Dopplers 1-39% stenosis bilateral, echocardiogram normal EF, mild LVH Neurology was consulted Taking aspirin and Plavix for 3 weeks and then discontinue aspirin and take Plavix alone Continue Crestor Blood pressure slightly elevated here today but she did not take her medication-stressed better compliance with her blood pressure medication.  Advised monitoring blood pressure at home and recording values Has right-sided weakness and doing PT/OT at home-home PT and hospital PT both feel she would be better served with rehab for more intensive physical therapy/Occupational Therapy-she now agrees and  like to go into rehab We will refer to social work to help place her in rehab Follow-up in 3 months

## 2018-07-14 NOTE — Assessment & Plan Note (Signed)
Last A1c 8.2% Sugars not ideally controlled Continue Januvia Will add farxiga 5 mg daily Stressed better compliance with a low sugar/carbohydrate diet

## 2018-07-15 ENCOUNTER — Telehealth: Payer: Self-pay

## 2018-07-15 DIAGNOSIS — R531 Weakness: Secondary | ICD-10-CM

## 2018-07-15 DIAGNOSIS — I639 Cerebral infarction, unspecified: Secondary | ICD-10-CM

## 2018-07-15 DIAGNOSIS — I6381 Other cerebral infarction due to occlusion or stenosis of small artery: Secondary | ICD-10-CM

## 2018-07-15 NOTE — Telephone Encounter (Signed)
[facility].

## 2018-07-15 NOTE — Telephone Encounter (Signed)
Copied from [place] [phone]. Topic: Quick Communication - Home Health Verbal Orders >> Jul 15, 2018 11:02 AM Virl Axe D wrote: Caller/Agency: [facility] Number: [phone] / Secure VM Requesting OT/PT/Skilled Nursing/Social Work: OT Frequency: 1 week 5

## 2018-07-15 NOTE — Telephone Encounter (Signed)
LVM for [place] giving ok for verbal orders.

## 2018-07-15 NOTE — Telephone Encounter (Signed)
Copied from [place] [id number]. Topic: General - Other >> Jul 15, 2018 11:09 AM Virl Axe D wrote: Reason for CRM: Pt needs a rx for bedside commode sent to [facility]. Please fax demographics with Rx .

## 2018-07-18 ENCOUNTER — Other Ambulatory Visit: Payer: Self-pay | Admitting: Internal Medicine

## 2018-07-18 DIAGNOSIS — R000 Tachycardia, unspecified: Secondary | ICD-10-CM

## 2018-07-18 DIAGNOSIS — I10 Essential (primary) hypertension: Secondary | ICD-10-CM

## 2018-07-19 NOTE — Progress Notes (Signed)
Follow-up Visit   Date: 07/21/18    Rebekah Kennedy MRN: [id number] DOB: [birth year]   Interim History: Rebekah Kennedy is a 63 y.o. right-handed African American female with history of uncontrolled diabetes mellitus (dx 2008, HbA1c 8.2), hyperlipidemia, hypertension, depression, and right pontine stroke (2012, mild left residual weakness) returning to the clinic with recent left thalamic stroke (06/2018) and right foot drop.  The patient was accompanied to the clinic by self.  History of present illness: About early September 2014, she notice stabbing pain over the left medial upper thigh, lasting 30 seconds. It spontaneously resolved. A week later, she had the same symptoms over the right leg and abdomen. It started occuring about once per day, but now occuring 1-3 x per day. The areas are localized to a quarter size area over the left inner upper thigh/groin, right anterior thigh, and bilateral lower anterior chest. There is no radiation or shooting quality to her symptoms. She denies any triggers. No exacerbating or alleviating factors. Denies any weakness or numbness/tingling of the feet. No saddle anesthesia or incontinence.  UPDATE 12/27/2017:  She is here with new complaints of right >> left foot numbness over the top of the foot and toes, which started in June.  Symptoms are constant without identifiable triggers.  She has also noticed weakness with extending her toe on the right and slight dragging of the foot.  She does not have weakness of the left foot, but has noticed intermittent numbness.  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  Imaging did not show impingement of the L5 nerve, which would explain her foot drop.  She continues to have foot weakness and frequently trips.  She has started to use a cane for balance, which  helps.  UPDATE 07/21/2018:  She is here for follow-up visit.  She suffered a left thalamic stroke on 2/17 manifesting with right numbness/tingling, dizziness, imbalance and fall.  She was started on aspirin 81 mg and Plavix 75 mg daily.  Over the past 2 weeks, she has noticed improved balance, but continues to have lack of coordination of the right hand.  She has been using a walker and is working with physical therapy.  Her right foot weakness has improved and she has been able to walk without using her AFO.  She continues to have some numbness and tingling of the right lower extremity.    Medications:  Current Outpatient Medications on File Prior to Visit  Medication Sig Dispense Refill  . albuterol (PROVENTIL HFA;VENTOLIN HFA) 108 (90 Base) MCG/ACT inhaler Inhale 2 puffs into the lungs every 4 (four) hours as needed for wheezing or shortness of breath. 18 g 2  . aspirin EC 81 MG EC tablet Take 1 tablet (81 mg total) by mouth daily. 30 tablet 0  . Biotin 5000 MCG TABS Take 5,000 mcg by mouth daily.     . clopidogrel (PLAVIX) 75 MG tablet Take 1 tablet (75 mg total) by mouth daily. 90 tablet 3  . dapagliflozin propanediol (FARXIGA) 5 MG TABS tablet Take 5 mg by mouth daily. 30 tablet 5  . losartan (COZAAR) 100 MG tablet TAKE 1 TAB BY MOUTH ONCE DAILY. (Patient taking differently: Take 100 mg by mouth daily. ) 90 tablet 1  . metoprolol succinate (TOPROL-XL) 100 MG 24 hr tablet TAKE 1 TABLET BY MOUTH DAILY WITH OR  IMMEDIATELY FOLLOWING A MEAL 90 tablet 1  . montelukast (SINGULAIR) 10 MG tablet Take 1 tablet (10 mg total) by mouth at bedtime. (Patient taking differently: Take 10 mg by mouth daily. ) 90 tablet 3  . OVER THE COUNTER MEDICATION Take 1 tablet by mouth daily. BariMelt    . pantoprazole (PROTONIX) 40 MG tablet Take 40 mg by mouth daily.  2  . rosuvastatin (CRESTOR) 10 MG tablet Take 1 tablet (10 mg total) by mouth daily. 90 tablet 3  . sitaGLIPtin (JANUVIA) 100 MG tablet Take 1 tablet (100  mg total) by mouth daily. 30 tablet 11   No current facility-administered medications on file prior to visit.     Allergies:  Allergies  Allergen Reactions  . Quinapril Cough    Review of Systems:  CONSTITUTIONAL: No fevers, chills, night sweats, or weight loss.  EYES: No visual changes or eye pain ENT: No hearing changes.  No history of nose bleeds.   RESPIRATORY: No cough, wheezing and shortness of breath.   CARDIOVASCULAR: Negative for chest pain, and palpitations.   GI: Negative for abdominal discomfort, blood in stools or black stools.  No recent change in bowel habits.   GU:  No history of incontinence.   MUSCLOSKELETAL: No history of joint pain or swelling.  No myalgias.   SKIN: Negative for lesions, rash, and itching.   ENDOCRINE: Negative for cold or heat intolerance, polydipsia or goiter.   PSYCH:  No depression or anxiety symptoms.   NEURO: As Above.   Vital Signs:  BP (!) 150/78   Pulse 63   Ht 5\' 5"  (1.651 m)   Wt 173 lb (78.5 kg)   SpO2 98%   BMI 28.79 kg/m   General Medical Exam:   General:  Well appearing, comfortable  Eyes/ENT: see cranial nerve examination.   Neck:   No carotid bruits. Respiratory:  Clear to auscultation, good air entry bilaterally.   Cardiac:  Regular rate and rhythm, no murmur.   Ext: No edema  Neurological Exam: MENTAL STATUS including orientation to time, place, person, recent and remote memory, attention span and concentration, language, and fund of knowledge is normal.  Speech is not dysarthric.  CRANIAL NERVES:  Visual fields intact.  Extraocular muscles intact.  Face is symmetric.  Tongue is midline.  MOTOR:  No atrophy, fasciculations or abnormal movements.  No pronator drift.  Tone is normal.  Right pronator drift.   Right Upper Extremity:    Left Upper Extremity:    Deltoid  5/5   Deltoid  5/5   Biceps  5/5   Biceps  5/5   Triceps  5/5   Triceps  5/5   Wrist extensors  5/5   Wrist extensors  5/5   Wrist flexors  5/5    Wrist flexors  5/5   Finger extensors  5/5   Finger extensors  5/5   Finger flexors  5/5   Finger flexors  5/5   Dorsal interossei  5/5   Dorsal interossei  5/5   Abductor pollicis  5/5   Abductor pollicis  5/5   Tone ([place] scale)  0  Tone ([place] scale)  0   Right Lower Extremity:    Left Lower Extremity:    Hip flexors  5/5   Hip flexors  5/5   Hip extensors  5/5   Hip extensors  5/5   Knee flexors  5/5   Knee flexors  5/5   Knee extensors  5/5  Knee extensors  5/5   Dorsiflexors  5-/5   Dorsiflexors  5/5   Plantarflexors  5/5   Plantarflexors  5/5   Eversion 4/5  Eversion 5/5  Inversion 5/5  Inversion 5/5  Toe extensors  5-/5   Toe extensors  5/5   Toe flexors  5/5   Toe flexors  5/5   Tone ([place] scale)  0  Tone ([place] scale)  0    COORDINATION/GAIT:  Mild dysmetria with finger to nose testing on the right.  Intact rapid alternating movements bilaterally.  She appears quite stable when walking with a walker, there is no evidence of right foot steppage.   Data: Lab Results  Component Value Date   CHOL 176 07/08/2018   HDL 62 07/08/2018   LDLCALC 81 07/08/2018   LDLDIRECT 126.7 03/24/2013   TRIG 163 (H) 07/08/2018   CHOLHDL 2.8 07/08/2018   Lab Results  Component Value Date   HGBA1C 8.2 (H) 07/08/2018   MRI brain 04/2011: 1. Acute lacunar type infarcts in the right mid brain and pons. No mass effect or hemorrhage.  2. Superimposed chronic lacunar infarct in the right paracentral pons. These findings indicate acute on chronic small vessel ischemia.  3. Otherwise mild for age nonspecific cerebral white matter signal changes.  4. Intracranial MRA findings are below.  MRA brain 04/2011: 1. 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 No major branch occlusion. There is irregularity suggesting atherosclerosis in the right proximal PCA.  2. Mild anterior circulation atherosclerosis. No stenosis or major branch occlusion.  MRI  lumbar spine 02/28/2018: 1. Mild disc bulging and facet hypertrophy at L3-4, L4-5, and L5-S1 contributes to mild foraminal narrowing. 2. No other significant stenosis is evident to explain foot drop.  MRI/A brain wo contrast 07/07/2018:  Small area of acute infarction left anterior thalamus.  Mild chronic microvascular ischemic changes in the white matter.  Intracranial atherosclerotic disease as above. Negative for emergent large vessel occlusion.  TTE 07/08/2018:  EF 60-65%, no PFO Lab Results  Component Value Date   CHOL 176 07/08/2018   HDL 62 07/08/2018   LDLCALC 81 07/08/2018   LDLDIRECT 126.7 03/24/2013   TRIG 163 (H) 07/08/2018   CHOLHDL 2.8 07/08/2018      IMPRESSION/PLAN: 1.  Left thalamic infract due to small vessel disease manifesting with right side ataxia.    She was previously compliant with plavix 75mg  and given that her new event occurs on plavix, I recommend staying on dual antiplatelet therapy with aspirin 81mg  + plavix 75mg  daily for secondary prevention. LDL is 81, continue crestor 10mg . BP is elevated today (150/78).  I have asked her to try to monitor blood pressure at home and share this with her PCP to see if medications need adjustment, BP goal is <130/80.  Continue home PT/OT.   2.  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  I have asked her to start trying to walk without using her AFO, especially as she is also under the care of physical therapy.  3.  Diabetic polyneuropathy.  Hemoglobin A1c is 8.2 and could be better controlled.  She admits to being poor with diet choices during the holidays and will be careful.  She has lost significant weight following her gastric bypass surgery.  4.  History of right pontine stroke (2012, mild left hemiparesis, small vessel disease).    Return to clinic in 3 months   Thank you for allowing me to  participate in patient's care.  If I can answer any additional  questions, I would be pleased to do so.    Sincerely,    Donika K. Posey Pronto, DO

## 2018-07-21 ENCOUNTER — Encounter: Payer: Self-pay | Admitting: Neurology

## 2018-07-21 ENCOUNTER — Ambulatory Visit (INDEPENDENT_AMBULATORY_CARE_PROVIDER_SITE_OTHER): Payer: BC Managed Care – PPO | Admitting: Neurology

## 2018-07-21 VITALS — BP 150/78 | HR 63 | Ht 65.0 in | Wt 173.0 lb

## 2018-07-21 DIAGNOSIS — I639 Cerebral infarction, unspecified: Secondary | ICD-10-CM

## 2018-07-21 DIAGNOSIS — G5731 Lesion of lateral popliteal nerve, right lower limb: Secondary | ICD-10-CM | POA: Diagnosis not present

## 2018-07-21 DIAGNOSIS — I6381 Other cerebral infarction due to occlusion or stenosis of small artery: Secondary | ICD-10-CM

## 2018-07-21 NOTE — Patient Instructions (Addendum)
Continue plavix 75mg  and aspirin 81mg   Monitor your blood pressure at home  Your right foot is stronger, you can try walking without the brace   Return to clinic in 3 months

## 2018-07-24 ENCOUNTER — Telehealth: Payer: Self-pay

## 2018-07-24 ENCOUNTER — Telehealth: Payer: Self-pay | Admitting: Internal Medicine

## 2018-07-24 NOTE — Telephone Encounter (Signed)
Was this discussed at visit?

## 2018-07-24 NOTE — Telephone Encounter (Signed)
Spoke with Lennette Bihari, intake mgr at [facility]. Pt has been seen by PT, OT & speech.  I let him know the verbal orders that were requested by them on 2/25.  He was unaware of skilled nursing and social work.  Gave him verbal orders for them today

## 2018-07-24 NOTE — Telephone Encounter (Signed)
Noted  

## 2018-07-24 NOTE — Telephone Encounter (Signed)
Copied from [place] [id number]. Topic: General - Inquiry >> Jul 23, 2018  3:38 PM Rutherford Nail, NT wrote: Reason for CRM: Patient calling and is inquiring about the medical social work request that was discussed at the last visit. Also, states the her HR department is calling and asking questions about leave and how long she will be out. Patient would like a call from Dr Quay Burow regarding this. Please advise.

## 2018-07-24 NOTE — Telephone Encounter (Signed)
Copied from [place] [id number]. Topic: Quick Communication - Home Health Verbal Orders >> Jul 24, 2018  9:17 AM Percell Belt A wrote: Caller/Agency: Radovan with [facility] Number: [phone] Requesting OT/PT/Skilled Nursing/Social Work: Pt is going to have missed OT visit this week ( 2/ today and tomorrow) pt stated she had other appts

## 2018-07-31 ENCOUNTER — Ambulatory Visit: Payer: BC Managed Care – PPO | Admitting: Internal Medicine

## 2018-07-31 ENCOUNTER — Encounter: Payer: Self-pay | Admitting: Internal Medicine

## 2018-07-31 ENCOUNTER — Other Ambulatory Visit: Payer: Self-pay

## 2018-07-31 VITALS — BP 142/76 | HR 56 | Temp 98.5°F | Resp 16 | Ht 65.0 in | Wt 172.0 lb

## 2018-07-31 DIAGNOSIS — H01119 Allergic dermatitis of unspecified eye, unspecified eyelid: Secondary | ICD-10-CM | POA: Diagnosis not present

## 2018-07-31 MED ORDER — TACROLIMUS 0.1 % EX OINT: TOPICAL_OINTMENT | Freq: Two times a day (BID) | CUTANEOUS | 0 refills | Status: DC

## 2018-07-31 NOTE — Assessment & Plan Note (Signed)
Symptoms consistent with dermatitis of the eyelids-possibly allergic or less likely contact Can continue advocating eyedrops Protopic twice daily for up to 14 days If no improvement will need to see dermatology

## 2018-07-31 NOTE — Patient Instructions (Addendum)
Apply the topical cream around your eyes twice daily for a short period of time, no more than 2 weeks.  If there is no improvement let me know and we may need to have you see dermatology.

## 2018-07-31 NOTE — Progress Notes (Signed)
Subjective:    Patient ID: Rebekah Kennedy, female    DOB: [birth year], 63 y.o.   MRN: [id number]  HPI The patient is here for an acute visit.   Eye symptoms started one week ago.  The skin around her eyes is darker, more baggy around eyes, her eyes appear crusty in morning and sometimes in the afternoon and she has a clear discharge from her eyes.  She thought this was allergies.  She did try using some lubricating eyedrops and it may have helped.  They do feel more comfortable.  She often gets eye symptoms with allergies.  She does have some rhinorrhea, but denies fevers, chills, nasal congestion, sneezing, sore throat, cough.  Her eyes have not been red and she denies any eye pain.  She denies photophobia and changes in her vision.   Medications and allergies reviewed with patient and updated if appropriate.  Patient Active Problem List   Diagnosis Date Noted  . Right sided weakness 07/14/2018  . Left thalamic infarction ([place]) 07/12/2018  . Carotid artery disease ([place]) 07/09/2018  . Stroke-like symptoms 07/07/2018  . Numbness and tingling 12/17/2017  . Right foot drop 12/17/2017  . Atherosclerosis of aorta ([place]) 10/09/2017  . AKI (acute kidney injury) ([place])   . Bariatric surgery status, 07/2017 08/19/2017  . Constipation 08/19/2017  . Hypertensive urgency 05/23/2017  . Greater trochanteric bursitis of both hips 12/19/2016  . OSA (obstructive sleep apnea) 07/26/2016  . Hypersomnia 06/11/2016  . Diabetes ([place]) 07/19/2015  . History of colonic polyps 12/01/2014  . Morbidly obese ([place]) 10/05/2013  . Partial nontraumatic tear of right rotator cuff 09/21/2013  . Goiter 12/10/2011  . Asthma 12/10/2011  . 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 History of TIA (transient ischemic attack) 02/13/2011  . PROTEINURIA, MILD 09/15/2009  . Hyperlipidemia 12/01/2006  . Essential hypertension 07/07/2006    Current Outpatient Medications on File Prior to Visit  Medication Sig  Dispense Refill  . albuterol (PROVENTIL HFA;VENTOLIN HFA) 108 (90 Base) MCG/ACT inhaler Inhale 2 puffs into the lungs every 4 (four) hours as needed for wheezing or shortness of breath. 18 g 2  . aspirin EC 81 MG EC tablet Take 1 tablet (81 mg total) by mouth daily. 30 tablet 0  . Biotin 5000 MCG TABS Take 5,000 mcg by mouth daily.     . clopidogrel (PLAVIX) 75 MG tablet Take 1 tablet (75 mg total) by mouth daily. 90 tablet 3  . dapagliflozin propanediol (FARXIGA) 5 MG TABS tablet Take 5 mg by mouth daily. 30 tablet 5  . losartan (COZAAR) 100 MG tablet TAKE 1 TAB BY MOUTH ONCE DAILY. (Patient taking differently: Take 100 mg by mouth daily. ) 90 tablet 1  . metoprolol succinate (TOPROL-XL) 100 MG 24 hr tablet TAKE 1 TABLET BY MOUTH DAILY WITH OR IMMEDIATELY FOLLOWING A MEAL 90 tablet 1  . montelukast (SINGULAIR) 10 MG tablet Take 1 tablet (10 mg total) by mouth at bedtime. (Patient taking differently: Take 10 mg by mouth daily. ) 90 tablet 3  . OVER THE COUNTER MEDICATION Take 1 tablet by mouth daily. BariMelt    . pantoprazole (PROTONIX) 40 MG tablet Take 40 mg by mouth daily.  2  . rosuvastatin (CRESTOR) 10 MG tablet Take 1 tablet (10 mg total) by mouth daily. 90 tablet 3  . sitaGLIPtin (JANUVIA) 100 MG tablet Take 1 tablet (100 mg total) by mouth daily. 30 tablet 11   No current facility-administered medications on file  prior to visit.     Past Medical History:  Diagnosis Date  . ASTHMA NOS W/ACUTE EXACERBATION 07/10/2010  . Cervical dysplasia   . DEPRESSION 03/16/2007  . DIABETES MELLITUS, TYPE II 12/01/2006  . Dysmenorrhea   . Fibroid   . HYPERLIPIDEMIA 12/01/2006  . HYPERTENSION 07/07/2006  . Neuropathy   . Obesity   . Osteopenia 05/2017   T score -1.5 FRAX 2.6% / 0.1%  . Retinal edema    gets steriod inj in the eyes    . Sleep apnea    HAS CPAP BUT ADMITS DOES NOT USE JUDICIALLY   . Splenomegaly    in college  . Stroke [facility]) 2012   R pontine, mild residual left hemiparesis  .  TIA (transient ischemic attack) 2012   2 TIAs 1 week apart of each other     Past Surgical History:  Procedure Laterality Date  . 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 APPENDECTOMY    . BIOPSY THYROID  05/02/11   Nonneoplastic goiter  . BREAST BIOPSY    . BREAST LUMPECTOMY WITH RADIOACTIVE SEED LOCALIZATION Left 07/25/2017   Procedure: LEFT BREAST LUMPECTOMY WITH RADIOACTIVE SEED LOCALIZATION;  Surgeon: Coralie Keens, MD;  Location: [facility];  Service: General;  Laterality: Left;  . BREAST SURGERY     Reduction  . COLPOSCOPY    . DILATION AND CURETTAGE OF UTERUS  1975   DUB  . EYE SURGERY     Laser  . GASTRIC ROUX-EN-Y N/A 08/12/2017   Procedure: LAPAROSCOPIC ROUX-EN-Y GASTRIC BYPASS WITH HIATAL HERNIA REPAIR AND UPPER ENDOSCOPY;  Surgeon: [name], [name], MD;  Location: [facility];  Service: General;  Laterality: N/A;  . GYNECOLOGIC CRYOSURGERY    . MYOMECTOMY    . OVARIAN CYST REMOVAL    . PELVIC LAPAROSCOPY  75,88   DL lysis of adhesions    Social History   Socioeconomic History  . Marital status: Single    Spouse name: Not on file  . Number of children: 2  . Years of education: 16  . Highest education level: Bachelor's degree (e.g., BA, AB, BS)  Occupational History  . Occupation: Product manager: [employer]  . Financial resource strain: Not on file  . Food insecurity:    Worry: Not on file    Inability: Not on file  . Transportation needs:    Medical: Not on file    Non-medical: Not on file  Tobacco Use  . Smoking status: Never Smoker  . Smokeless tobacco: Never Used  Substance and Sexual Activity  . Alcohol use: Yes    Alcohol/week: 0.0 standard drinks    Comment: very rare  . Drug use: No  . Sexual activity: Not Currently    Birth control/protection: Post-menopausal    Comment: 1st intercourse 63 yo-Fewer than 5 partners  Lifestyle  . Physical activity:    Days per week: Not on file    Minutes per session: Not on file  .  Stress: Not on file  Relationships  . Social connections:    Talks on phone: Not on file    Gets together: Not on file    Attends religious service: Not on file    Active member of club or organization: Not on file    Attends meetings of clubs or organizations: Not on file    Relationship status: Not on file  Other Topics Concern  . 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 Cancer Maternal Grandmother        Colon Cancer  . Asthma Maternal Grandmother   . Diabetes Father   . Heart disease Father   . Hypertension Father   . Hyperlipidemia Father   . Hypertension Mother   . Heart disease Mother   . Ovarian cancer Mother   . Cancer Mother        Lung cancer  . Asthma Mother   . COPD Mother   . Hyperlipidemia Mother   . Diabetes Brother   . Hypertension Brother   . Kidney disease Brother   . Asthma Brother   . Heart disease Brother   . Hyperlipidemia Brother   . Cancer Brother        Prostate  . Graves' disease Sister   . Diabetes Sister   . Breast cancer Sister 44  . Graves' disease Paternal Grandmother   . Hypertension Paternal Grandmother   . Heart disease Paternal Grandmother   . Alzheimer's disease Paternal Grandmother   . Cancer Maternal Grandfather   . Heart failure Brother   . Heart failure Brother     Review of Systems  Constitutional: Negative for chills and fever.  HENT: Positive for rhinorrhea. Negative for congestion, sneezing and sore throat.   Eyes: Positive for discharge (clear) and itching (occasionally). Negative for photophobia, pain, redness and visual disturbance (no blurry vision).  Respiratory: Negative for cough.        Objective:   Vitals:   07/31/18 1023  BP: (!) 142/76  Pulse: (!) 56  Resp: 16  Temp: 98.5 F (36.9 C)  SpO2: 98%   BP Readings from Last 3 Encounters:  07/31/18 (!)  142/76  07/21/18 (!) 150/78  07/14/18 (!) 158/78   Wt Readings from Last 3 Encounters:  07/31/18 172 lb (78 kg)  07/21/18 173 lb (78.5 kg)  07/14/18 175 lb 12.8 oz (79.7 kg)   Body mass index is 28.62 kg/m.   Physical Exam Constitutional:      General: She is not in acute distress.    Appearance: Normal appearance. She is not ill-appearing.  HENT:     Head: Normocephalic and atraumatic.  Eyes:     General: No scleral icterus.       Right eye: No discharge.        Left eye: No discharge.     Extraocular Movements: Extraocular movements intact.     Conjunctiva/sclera: Conjunctivae normal.     Comments: No eyelid swelling  Skin:    Comments: Hyperpigmented mild erythema, slightly baggy skin peri-orbital region, skin feels dry, no edema  Neurological:     Mental Status: She is alert.            Assessment & Plan:    See Problem List for Assessment and Plan of chronic medical problems.

## 2018-08-05 ENCOUNTER — Telehealth: Payer: Self-pay | Admitting: Internal Medicine

## 2018-08-05 NOTE — Telephone Encounter (Signed)
LVM for patient to call back and ask to speak with Rebekah Kennedy at the office. I have questions about the return to work date.

## 2018-08-05 NOTE — Telephone Encounter (Signed)
Patient has dropped off FMLA forms to be completed in reference to stroke from Feb.  I am placing forms in Brittany's box.

## 2018-08-06 NOTE — Telephone Encounter (Signed)
Spoke with patient. Return to work date is TBD. Once patient has completed HH, We can determine a return date.

## 2018-08-07 ENCOUNTER — Telehealth: Payer: Self-pay | Admitting: Neurology

## 2018-08-07 DIAGNOSIS — Z0279 Encounter for issue of other medical certificate: Secondary | ICD-10-CM

## 2018-08-07 LAB — HM MAMMOGRAPHY

## 2018-08-07 NOTE — Telephone Encounter (Signed)
Forms have been signed, faxed to [employer] @ [phone], Copy sent to scan &charged for.   Original mailed to patient.

## 2018-08-08 ENCOUNTER — Telehealth: Payer: Self-pay | Admitting: Internal Medicine

## 2018-08-08 NOTE — Telephone Encounter (Signed)
Copied from [place] [id number]. Topic: Quick Communication - Home Health Verbal Orders >> Aug 08, 2018  2:50 PM Sheran Luz wrote: Caller/AgencyDorothyann Gibbs with [facility] Number: [phone] Requesting PT for missed visit today  Frequency: 1w1

## 2018-08-11 NOTE — Telephone Encounter (Signed)
Gave ok for verbal orders.  

## 2018-08-19 ENCOUNTER — Telehealth: Payer: Self-pay | Admitting: Internal Medicine

## 2018-08-19 NOTE — Telephone Encounter (Signed)
Copied from [place] [id number]. Topic: Quick Communication - See Telephone Encounter >> Aug 19, 2018  4:50 PM Selinda Flavin B, NT wrote: CRM for notification. See Telephone encounter for: 08/19/18. Sherri with [facility] calling and states that they have been trying to see the patient for reassessment. The patient refused again last week. Would like to go ahead and discharge her. States that the patient is not home bound any more, that she is always in a different location CB#: [phone]

## 2018-08-20 NOTE — Telephone Encounter (Signed)
Ok. Noted 

## 2018-08-23 ENCOUNTER — Other Ambulatory Visit: Payer: Self-pay | Admitting: Internal Medicine

## 2018-09-03 ENCOUNTER — Encounter: Payer: Self-pay | Admitting: Internal Medicine

## 2018-09-10 ENCOUNTER — Other Ambulatory Visit: Payer: Self-pay | Admitting: Endocrinology

## 2018-09-12 ENCOUNTER — Telehealth: Payer: Self-pay | Admitting: Internal Medicine

## 2018-09-12 NOTE — Telephone Encounter (Signed)
Attending Physician Statement for Disability Claim was dropped off to be completed by Dr Quay Burow. Patient would like this to be faxed when it is done.  Placed in Brittany's box for completion.

## 2018-09-15 NOTE — Telephone Encounter (Signed)
Forms have been completed & placed in providers to review and sign.

## 2018-09-16 NOTE — Telephone Encounter (Signed)
Forms signed, Faxed to [employer], Copy sent to scan. No charge, patient already had a charged for forms in March.    Patient informed and will pick up original forms.

## 2018-09-30 ENCOUNTER — Encounter: Payer: Self-pay | Admitting: Internal Medicine

## 2018-10-02 ENCOUNTER — Other Ambulatory Visit: Payer: Self-pay | Admitting: Endocrinology

## 2018-10-03 ENCOUNTER — Other Ambulatory Visit: Payer: Self-pay | Admitting: Internal Medicine

## 2018-10-03 NOTE — Telephone Encounter (Signed)
I spoke to pt- she states she still has some of the fisrt ointment that she filled and her sxs are better. However, the sxs come and go. Per 07/31/18 OV note- Dr. Quay Burow suggested derm referral. I asked patient if she truly needs the refill now and if she needs the dermatology appt. She states she will hold off on both now.

## 2018-10-13 ENCOUNTER — Other Ambulatory Visit: Payer: Self-pay | Admitting: Internal Medicine

## 2018-10-22 NOTE — Progress Notes (Signed)
Subjective:    Patient ID: Rebekah Kennedy, female    DOB: 08-Dec-1955, 63 y.o.   MRN: [id number]  HPI The patient is here for follow up.  She is doing her PT exercises, but no other exercise.     Hypertension: She is taking her medication daily. She is compliant with a low sodium diet.  She denies chest pain, palpitations, edema, shortness of breath and regular headaches. She does monitor her blood pressure at home - 120/80 - 165/100.      Hyperlipidemia: She is taking her medication daily. She is compliant with a low fat/cholesterol diet. She denies myalgias.   Diabetes: she follows with Dr Loanne Drilling.  She is taking her medication daily as prescribed. She is compliant with a diabetic diet.   She denies numbness/tingling in her feet and foot lesions. She is up-to-date with an ophthalmology examination.   GERD:  She is taking her medication daily as prescribed.  She denies any GERD symptoms and feels her GERD is well controlled.   H/o stroke/ TIA:  She had a stroke 06/2018.  She is taking plavix and crestor daily.  She still has difficulty with her balance.  Her right arm is weak and drops things.  She has difficulty writing.  She is interested in doing more PT if she can.    Obesity:   She had gastric sleeve 07/2017.  She has lost few pounds since she was here last.  She is trying to decide if she should retire.  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  She thinks it would be difficult to continue to do her job with her deficits from her stroke.  One thing that she will not be able to do is restrain kids if needed.  She is also going to be filing for disability.  Agree she most likely will not be able to perform her job duties and I do recommend that she retire.    Medications and allergies reviewed with patient and updated if appropriate.  Patient Active Problem List   Diagnosis Date Noted  . Dermatitis of eyelid, contact or allergic  07/31/2018  . Right sided weakness 07/14/2018  . Left thalamic infarction ([place]) 07/12/2018  . Carotid artery disease ([place]) 07/09/2018  . Stroke-like symptoms 07/07/2018  . Numbness and tingling 12/17/2017  . Right foot drop 12/17/2017  . Atherosclerosis of aorta ([place]) 10/09/2017  . Bariatric surgery status, 07/2017 08/19/2017  . Constipation 08/19/2017  . Hypertensive urgency 05/23/2017  . Greater trochanteric bursitis of both hips 12/19/2016  . OSA (obstructive sleep apnea) 07/26/2016  . Hypersomnia 06/11/2016  . Diabetes ([place]) 07/19/2015  . History of colonic polyps 12/01/2014  . Morbidly obese ([place]) 10/05/2013  . Partial nontraumatic tear of right rotator cuff 09/21/2013  . Goiter 12/10/2011  . Asthma 12/10/2011  . 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 History of TIA (transient ischemic attack) 02/13/2011  . PROTEINURIA, MILD 09/15/2009  . Hyperlipidemia 12/01/2006  . Essential hypertension 07/07/2006    Current Outpatient Medications on File Prior to Visit  Medication Sig Dispense Refill  . albuterol (PROVENTIL HFA;VENTOLIN HFA) 108 (90 Base) MCG/ACT inhaler Inhale 2 puffs into the lungs every 4 (four) hours as needed for wheezing or shortness of breath. 18 g 2  . aspirin EC 81 MG EC tablet Take 1 tablet (81 mg total) by mouth daily. 30 tablet 0  . Biotin 5000 MCG TABS Take 5,000  mcg by mouth daily.     . clopidogrel (PLAVIX) 75 MG tablet Take 1 tablet (75 mg total) by mouth daily. 90 tablet 3  . dapagliflozin propanediol (FARXIGA) 5 MG TABS tablet Take 5 mg by mouth daily. 30 tablet 5  . JANUVIA 100 MG tablet TAKE 1 TABLET BY MOUTH EVERY DAY 30 tablet 0  . losartan (COZAAR) 100 MG tablet TAKE 1 TAB BY MOUTH ONCE DAILY. (Patient taking differently: Take 100 mg by mouth daily. ) 90 tablet 1  . metoprolol succinate (TOPROL-XL) 100 MG 24 hr tablet TAKE 1 TABLET BY MOUTH DAILY WITH OR IMMEDIATELY FOLLOWING A MEAL 90 tablet 1  . montelukast (SINGULAIR) 10 MG tablet Take 1  tablet (10 mg total) by mouth daily. 90 tablet 0  . OVER THE COUNTER MEDICATION Take 1 tablet by mouth daily. BariMelt    . pantoprazole (PROTONIX) 40 MG tablet Take 40 mg by mouth daily.  2  . rosuvastatin (CRESTOR) 10 MG tablet Take 1 tablet (10 mg total) by mouth daily. 90 tablet 3  . tacrolimus (PROTOPIC) 0.1 % ointment APPLY TO AFFECTED AREA TWICE A DAY 30 g 0   No current facility-administered medications on file prior to visit.     Past Medical History:  Diagnosis Date  . ASTHMA NOS W/ACUTE EXACERBATION 07/10/2010  . Cervical dysplasia   . DEPRESSION 03/16/2007  . DIABETES MELLITUS, TYPE II 12/01/2006  . Dysmenorrhea   . Fibroid   . HYPERLIPIDEMIA 12/01/2006  . HYPERTENSION 07/07/2006  . Neuropathy   . Obesity   . Osteopenia 05/2017   T score -1.5 FRAX 2.6% / 0.1%  . Retinal edema    gets steriod inj in the eyes    . Sleep apnea    HAS CPAP BUT ADMITS DOES NOT USE JUDICIALLY   . Splenomegaly    in college  . Stroke [facility]) 2012   R pontine, mild residual left hemiparesis  . TIA (transient ischemic attack) 2012   2 TIAs 1 week apart of each other     Past Surgical History:  Procedure Laterality Date  . 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 APPENDECTOMY    . BIOPSY THYROID  05/02/11   Nonneoplastic goiter  . BREAST BIOPSY    . BREAST LUMPECTOMY WITH RADIOACTIVE SEED LOCALIZATION Left 07/25/2017   Procedure: LEFT BREAST LUMPECTOMY WITH RADIOACTIVE SEED LOCALIZATION;  Surgeon: Coralie Keens, MD;  Location: [facility];  Service: General;  Laterality: Left;  . BREAST SURGERY     Reduction  . COLPOSCOPY    . DILATION AND CURETTAGE OF UTERUS  1975   DUB  . EYE SURGERY     Laser  . GASTRIC ROUX-EN-Y N/A 08/12/2017   Procedure: LAPAROSCOPIC ROUX-EN-Y GASTRIC BYPASS WITH HIATAL HERNIA REPAIR AND UPPER ENDOSCOPY;  Surgeon: [name], [name], MD;  Location: [facility];  Service: General;  Laterality: N/A;  . GYNECOLOGIC CRYOSURGERY    . MYOMECTOMY    . OVARIAN CYST REMOVAL    .  PELVIC LAPAROSCOPY  75,88   DL lysis of adhesions    Social History   Socioeconomic History  . Marital status: Single    Spouse name: Not on file  . Number of children: 2  . Years of education: 16  . Highest education level: Bachelor's degree (e.g., BA, AB, BS)  Occupational History  . Occupation: Product manager: [employer]  . Financial resource strain: Not on file  . Food insecurity:  Worry: Not on file    Inability: Not on file  . Transportation needs:    Medical: Not on file    Non-medical: Not on file  Tobacco Use  . Smoking status: Never Smoker  . Smokeless tobacco: Never Used  Substance and Sexual Activity  . Alcohol use: Yes    Alcohol/week: 0.0 standard drinks    Comment: very rare  . Drug use: No  . Sexual activity: Not Currently    Birth control/protection: Post-menopausal    Comment: 1st intercourse 63 yo-Fewer than 5 partners  Lifestyle  . Physical activity:    Days per week: Not on file    Minutes per session: Not on file  . Stress: Not on file  Relationships  . Social connections:    Talks on phone: Not on file    Gets together: Not on file    Attends religious service: Not on file    Active member of club or organization: Not on file    Attends meetings of clubs or organizations: Not on file    Relationship status: Not on file  Other Topics Concern  . 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 Cancer Maternal Grandmother        Colon Cancer  . Asthma Maternal Grandmother   . Diabetes Father   . Heart disease Father   . Hypertension Father   . Hyperlipidemia Father   . Hypertension Mother   . Heart disease Mother   . Ovarian cancer Mother   . Cancer Mother        Lung cancer  . Asthma Mother   . COPD Mother   . Hyperlipidemia Mother   . Diabetes Brother   .  Hypertension Brother   . Kidney disease Brother   . Asthma Brother   . Heart disease Brother   . Hyperlipidemia Brother   . Cancer Brother        Prostate  . Graves' disease Sister   . Diabetes Sister   . Breast cancer Sister 48  . Graves' disease Paternal Grandmother   . Hypertension Paternal Grandmother   . Heart disease Paternal Grandmother   . Alzheimer's disease Paternal Grandmother   . Cancer Maternal Grandfather   . Heart failure Brother   . Heart failure Brother     Review of Systems  Constitutional: Negative for chills and fever.  Respiratory: Positive for shortness of breath (with asthma). Negative for cough and wheezing.   Cardiovascular: Positive for palpitations (rare). Negative for chest pain.  Neurological: Positive for weakness. Negative for light-headedness and headaches.       Objective:   Vitals:   10/23/18 1102  BP: (!) 168/100  Pulse: 72  Temp: 98.7 F (37.1 C)  SpO2: 98%   BP Readings from Last 3 Encounters:  10/23/18 (!) 168/100  07/31/18 (!) 142/76  07/21/18 (!) 150/78   Wt Readings from Last 3 Encounters:  10/23/18 168 lb (76.2 kg)  07/31/18 172 lb (78 kg)  07/21/18 173 lb (78.5 kg)   Body mass index is 27.96 kg/m.   Physical Exam    Constitutional: Appears well-developed and well-nourished. No distress.  HENT:  Head: Normocephalic and atraumatic.  Neck: Neck supple. No tracheal deviation present. No thyromegaly present.  No cervical lymphadenopathy Cardiovascular: Normal rate, regular rhythm  and normal heart sounds.   No murmur heard. No carotid bruit .  No edema Pulmonary/Chest: Effort normal and breath sounds normal. No respiratory distress. No has no wheezes. No rales.  Skin: Skin is warm and dry. Not diaphoretic.  Psychiatric: Normal mood and affect. Behavior is normal.      Assessment & Plan:    See Problem List for Assessment and Plan of chronic medical problems.

## 2018-10-22 NOTE — Patient Instructions (Addendum)
  Tests ordered today. Your results will be released to [place] (or called to you) after review, usually within 72hours after test completion. If any changes need to be made, you will be notified at that same time.  Medications reviewed and updated.  Changes include :   Start amlodipine 2.5 mg daily.   Your prescription(s) have been submitted to your pharmacy. Please take as directed and contact our office if you believe you are having problem(s) with the medication(s).  A referral was ordered for physical therapy  Please followup in 6 months, sooner if your BP is not controlled

## 2018-10-23 ENCOUNTER — Other Ambulatory Visit: Payer: BC Managed Care – PPO

## 2018-10-23 ENCOUNTER — Encounter: Payer: Self-pay | Admitting: Internal Medicine

## 2018-10-23 ENCOUNTER — Other Ambulatory Visit: Payer: Self-pay

## 2018-10-23 ENCOUNTER — Other Ambulatory Visit (INDEPENDENT_AMBULATORY_CARE_PROVIDER_SITE_OTHER): Payer: BC Managed Care – PPO

## 2018-10-23 ENCOUNTER — Ambulatory Visit (INDEPENDENT_AMBULATORY_CARE_PROVIDER_SITE_OTHER): Payer: BC Managed Care – PPO | Admitting: Internal Medicine

## 2018-10-23 VITALS — BP 168/100 | HR 72 | Temp 98.7°F | Ht 65.0 in | Wt 168.0 lb

## 2018-10-23 DIAGNOSIS — Z8673 Personal history of transient ischemic attack (TIA), and cerebral infarction without residual deficits: Secondary | ICD-10-CM

## 2018-10-23 DIAGNOSIS — E1151 Type 2 diabetes mellitus with diabetic peripheral angiopathy without gangrene: Secondary | ICD-10-CM | POA: Diagnosis not present

## 2018-10-23 DIAGNOSIS — I10 Essential (primary) hypertension: Secondary | ICD-10-CM | POA: Diagnosis not present

## 2018-10-23 DIAGNOSIS — E7849 Other hyperlipidemia: Secondary | ICD-10-CM | POA: Diagnosis not present

## 2018-10-23 DIAGNOSIS — Z794 Long term (current) use of insulin: Secondary | ICD-10-CM

## 2018-10-23 LAB — HEMOGLOBIN A1C: Hgb A1c MFr Bld: 6.7 % — ABNORMAL HIGH (ref 4.6–6.5)

## 2018-10-23 LAB — COMPREHENSIVE METABOLIC PANEL
ALT: 21 U/L (ref 0–35)
AST: 13 U/L (ref 0–37)
Albumin: 4 g/dL (ref 3.5–5.2)
Alkaline Phosphatase: 76 U/L (ref 39–117)
BUN: 14 mg/dL (ref 6–23)
CO2: 27 mEq/L (ref 19–32)
Calcium: 9.8 mg/dL (ref 8.4–10.5)
Chloride: 105 mEq/L (ref 96–112)
Creatinine, Ser: 0.72 mg/dL (ref 0.40–1.20)
GFR: 99.25 mL/min (ref >60.00)
Glucose, Bld: 154 mg/dL — ABNORMAL HIGH (ref 70–99)
Potassium: 3.9 mEq/L (ref 3.5–5.1)
Sodium: 142 mEq/L (ref 135–145)
Total Bilirubin: 0.8 mg/dL (ref 0.2–1.2)
Total Protein: 7.1 g/dL (ref 6.0–8.3)

## 2018-10-23 LAB — LIPID PANEL
Cholesterol: 156 mg/dL (ref 0–200)
HDL: 64.9 mg/dL (ref >39.00)
LDL Cholesterol: 68 mg/dL (ref 0–99)
NonHDL: 91.53
Total CHOL/HDL Ratio: 2
Triglycerides: 117 mg/dL (ref 0.0–149.0)
VLDL: 23.4 mg/dL (ref 0.0–40.0)

## 2018-10-23 LAB — CBC WITH DIFFERENTIAL/PLATELET
Basophils Absolute: 0.1 10*3/uL (ref 0.0–0.1)
Basophils Relative: 1.2 % (ref 0.0–3.0)
Eosinophils Absolute: 0.4 10*3/uL (ref 0.0–0.7)
Eosinophils Relative: 4.9 % (ref 0.0–5.0)
HCT: 41.4 % (ref 36.0–46.0)
Hemoglobin: 13.9 g/dL (ref 12.0–15.0)
Lymphocytes Relative: 33.3 % (ref 12.0–46.0)
Lymphs Abs: 2.7 10*3/uL (ref 0.7–4.0)
MCHC: 33.5 g/dL (ref 30.0–36.0)
MCV: 84.2 fl (ref 78.0–100.0)
Monocytes Absolute: 0.5 10*3/uL (ref 0.1–1.0)
Monocytes Relative: 5.8 % (ref 3.0–12.0)
Neutro Abs: 4.4 10*3/uL (ref 1.4–7.7)
Neutrophils Relative %: 54.8 % (ref 43.0–77.0)
Platelets: 281 10*3/uL (ref 150.0–400.0)
RBC: 4.92 Mil/uL (ref 3.87–5.11)
RDW: 13.7 % (ref 11.5–15.5)
WBC: 8 10*3/uL (ref 4.0–10.5)

## 2018-10-23 MED ORDER — AMLODIPINE BESYLATE 2.5 MG PO TABS: 2.5000 mg | ORAL_TABLET | Freq: Every day | ORAL | 5 refills | Status: DC

## 2018-10-23 NOTE — Assessment & Plan Note (Signed)
Following with Dr. Loanne Drilling Check A1c Encourage more regular exercise

## 2018-10-23 NOTE — Assessment & Plan Note (Signed)
Continue aspirin 81 mg daily, Plavix daily and Crestor No concerning symptoms Will refer to PT CBC, CMP, lipid panel

## 2018-10-23 NOTE — Assessment & Plan Note (Signed)
Blood pressure not ideally controlled Continue current medications Start amlodipine 2.5 mg daily Advised that she monitor her blood pressure at home and let me know if it is not better controlled CMP, CBC

## 2018-10-23 NOTE — Assessment & Plan Note (Signed)
Check lipid panel  Continue daily statin Regular exercise and healthy diet encouraged  

## 2018-10-24 ENCOUNTER — Encounter: Payer: Self-pay | Admitting: Internal Medicine

## 2018-10-27 ENCOUNTER — Other Ambulatory Visit: Payer: Self-pay | Admitting: Internal Medicine

## 2018-10-30 ENCOUNTER — Ambulatory Visit
Payer: BC Managed Care – PPO | Attending: Internal Medicine | Admitting: Rehabilitative and Restorative Service Providers"

## 2018-10-30 ENCOUNTER — Telehealth: Payer: Self-pay | Admitting: Physical Therapy

## 2018-10-30 ENCOUNTER — Encounter: Payer: Self-pay | Admitting: Rehabilitative and Restorative Service Providers"

## 2018-10-30 ENCOUNTER — Other Ambulatory Visit: Payer: Self-pay

## 2018-10-30 VITALS — BP 160/70

## 2018-10-30 DIAGNOSIS — I69351 Hemiplegia and hemiparesis following cerebral infarction affecting right dominant side: Secondary | ICD-10-CM | POA: Diagnosis present

## 2018-10-30 DIAGNOSIS — R2689 Other abnormalities of gait and mobility: Secondary | ICD-10-CM | POA: Insufficient documentation

## 2018-10-30 DIAGNOSIS — M6281 Muscle weakness (generalized): Secondary | ICD-10-CM | POA: Insufficient documentation

## 2018-10-30 DIAGNOSIS — R2681 Unsteadiness on feet: Secondary | ICD-10-CM | POA: Diagnosis not present

## 2018-10-30 DIAGNOSIS — R29898 Other symptoms and signs involving the musculoskeletal system: Secondary | ICD-10-CM

## 2018-10-30 NOTE — Therapy (Addendum)
[facility] [address] [place], [place], [address] Phone: [phone]   Fax:  [phone]  Physical Therapy Evaluation  Patient Details  Name: Rebekah Kennedy MRN: [id number] Date of Birth: 1955-11-18 Referring Provider (PT): Billey Gosling, MD   Encounter Date: 10/30/2018   CLINIC OPERATION CHANGES: Outpatient Neuro Rehab is open at lower capacity following universal masking, social distancing, and patient screening.  The patient's COVID risk of complications score is 5.   PT End of Session - 10/30/18 1125    Visit Number  1    Number of Visits  17    Date for PT Re-Evaluation  12/29/18    Authorization Type  BCBS    PT Start Time  1006    PT Stop Time  1050    PT Time Calculation (min)  44 min    Equipment Utilized During Treatment  Gait belt    Activity Tolerance  Patient tolerated treatment well    Behavior During Therapy  WFL for tasks assessed/performed       Past Medical History:  Diagnosis Date  . ASTHMA NOS W/ACUTE EXACERBATION 07/10/2010  . Cervical dysplasia   . DEPRESSION 03/16/2007  . DIABETES MELLITUS, TYPE II 12/01/2006  . Dysmenorrhea   . Fibroid   . HYPERLIPIDEMIA 12/01/2006  . HYPERTENSION 07/07/2006  . Neuropathy   . Obesity   . Osteopenia 05/2017   T score -1.5 FRAX 2.6% / 0.1%  . Retinal edema    gets steriod inj in the eyes    . Sleep apnea    HAS CPAP BUT ADMITS DOES NOT USE JUDICIALLY   . Splenomegaly    in college  . Stroke [facility]) 2012   R pontine, mild residual left hemiparesis  . TIA (transient ischemic attack) 2012   2 TIAs 1 week apart of each other     Past Surgical History:  Procedure Laterality Date  . 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 APPENDECTOMY    . BIOPSY THYROID  05/02/11   Nonneoplastic goiter  . BREAST BIOPSY    . BREAST LUMPECTOMY WITH RADIOACTIVE SEED LOCALIZATION Left 07/25/2017   Procedure: LEFT BREAST LUMPECTOMY WITH RADIOACTIVE SEED LOCALIZATION;   Surgeon: Coralie Keens, MD;  Location: [facility];  Service: General;  Laterality: Left;  . BREAST SURGERY     Reduction  . COLPOSCOPY    . DILATION AND CURETTAGE OF UTERUS  1975   DUB  . EYE SURGERY     Laser  . GASTRIC ROUX-EN-Y N/A 08/12/2017   Procedure: LAPAROSCOPIC ROUX-EN-Y GASTRIC BYPASS WITH HIATAL HERNIA REPAIR AND UPPER ENDOSCOPY;  Surgeon: [name], [name], MD;  Location: [facility];  Service: General;  Laterality: N/A;  . GYNECOLOGIC CRYOSURGERY    . MYOMECTOMY    . OVARIAN CYST REMOVAL    . PELVIC LAPAROSCOPY  75,88   DL lysis of adhesions    Vitals:   10/30/18 1012  BP: (!) 160/70     Subjective Assessment - 10/30/18 1018    Subjective  Patient states that she had a stroke February 2020 and states that is currently having the most trouble with balance and trying to write (difficulty with grip strength). After being discharged she has been using a SPC in the house and the community. Also states that her right foot feels like it crosses over her left when she is walking and is about to trip.    Pertinent History  L thalamic stroke (06/2018), R pontine stroke (  2012), HTN, Uncontrolled diabetes    Limitations  Walking;Writing    Patient Stated Goals  Wants to improve her balance and get back to walking at the [facility] (when safely able to)    Currently in Pain?  No/denies         [facility] PT Assessment - 10/30/18 1021      Assessment   Medical Diagnosis  L thalamic stroke    Referring Provider (PT)  Billey Gosling, MD    Onset Date/Surgical Date  07/07/18    Hand Dominance  Right    Prior Therapy  Yes    After last stroke in 2012     Precautions   Precautions  --   HTN     Balance Screen   Has the patient fallen in the past 6 months  Yes   In bathroom at home, slow descent. No reported injury.   How many times?  [place] to enter   [place]  One level    [place] - single point;Other (comment)   Reports she has 2 AFOs for R foot and a quad cane   Additional Comments  Currently living at daughters home with a total of 9 STE. Above information is for patient's home that she hopes to be moving back to in the next month.       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   Leisure  Walking at [employer] (prior to [employer])      Cognition   Overall Cognitive Status  Within Functional Limits for tasks assessed      Sensation   Light Touch  Appears Intact   B LE   Additional Comments  Reports occassional numbness/tingling R foot      Coordination   Gross Motor Movements are Fluid and Coordinated  --      Posture/Postural Control   Posture/Postural Control  --      ROM / Strength   AROM / PROM / Strength  Strength      Strength   Overall Strength  Deficits    Overall Strength Comments  R hip flexion 4/5 with compensatory posterior trunk lean, L hip flexion 4+/5 w/ compensatory posterior trunk lean.R knee ext 4/5, flex 4+/5; L knee flex 4/5 ext 4/5. L ankle DF 3+/5; R ankle DF 2+/5    Strength Assessment Site  Knee;Hip;Ankle    Right/Left Hip  --    Right Hip Flexion  --   compensatory posterior lean     Transfers   Transfers  Sit to Stand;Stand to Sit;Stand Pivot Transfers    Sit to Stand  5: Supervision   From low surface   Stand to Sit  6: Modified independent (Device/Increase time)    Stand Pivot Transfers  5: Supervision   without use of SPC     Ambulation/Gait   Ambulation/Gait  Yes    Ambulation/Gait Assistance  4: Min guard;4: Min assist    Ambulation/Gait Assistance Details  Patient ambulated with [facility] requiring min A/CGA from therapist and verbal cueing for correct placement of SPC (close to patient  and on the ground). Patient has a frequent tendency to lift cane off the ground while ambulating. Patient demonstrated a slow cadence and a narrow base of support with intermittent episodes of scissoring.      Assistive device  Straight cane    Gait Pattern  Decreased dorsiflexion - right;Decreased weight shift to right;Scissoring;Narrow base of support;Poor foot clearance - right;Decreased step length - left;Trunk flexed    Ambulation Surface  Level    Gait velocity  1.08 ft/sec    Gait Comments  Educated patient on correct gait mechanics with cane (holding cane in L hand vs R to increase stability). Decreased cane height in order to prevent shoulder elevation/hiking during gait.      Balance   Balance Assessed  Yes      Standardized Balance Assessment   Standardized Balance Assessment  Five Times Sit to Stand;Berg Balance Test   20.53 seconds    Five times sit to stand comments   From low mat table. No use of UE or device. CGA from therapist during last 3 reps.      Berg Balance Test   Sit to Stand  Able to stand  independently using hands    Standing Unsupported  Able to stand safely 2 minutes    Sitting with Back Unsupported but Feet Supported on Floor or Stool  Able to sit safely and securely 2 minutes    Stand to Sit  Sits safely with minimal use of hands    Transfers  Able to transfer safely, definite need of hands    Standing Unsupported with Eyes Closed  Able to stand 10 seconds with supervision    Standing Unsupported with Feet Together  Able to place feet together independently and stand 1 minute safely    From Standing, Reach Forward with Outstretched Arm  Can reach forward >5 cm safely (2")    From Standing Position, Pick up Object from Floor  Able to pick up shoe, needs supervision    From Standing Position, Turn to Look Behind Over each Shoulder  Looks behind one side only/other side shows less weight shift    Turn 360 Degrees  Able to turn 360 degrees safely one side only in  4 seconds or less    Standing Unsupported, Alternately Place Feet on Step/Stool  Needs assistance to keep from falling or unable to try    Standing Unsupported, One Foot in Front  Able to take small step independently and hold 30 seconds    Standing on One Leg  Tries to lift leg/unable to hold 3 seconds but remains standing independently    Total Score  39    Berg comment:  Significant risk of falls                Objective measurements completed on examination: See above findings.              PT Education - 10/30/18 1124    Education Details  Discussed clinical findings, goals, and POC with patient. Educated patient on correct mechanics to hold SPC (L hand vs R), safety, and proper cane height.    Person(s) Educated  Patient    Methods  Explanation;Verbal cues    Comprehension  Verbalized understanding;Returned demonstration;Need further instruction       PT Short Term Goals - 10/30/18 1144      PT SHORT TERM GOAL #1   Title  Patient will be independent with initial HEP to address balance and LE strength  to address functional impairments.    Time  4    Period  Weeks      PT SHORT TERM GOAL #2   Title  Patient will decrease 5 times sit <> stand score to 17 seconds from a low surface in order to increase functional LE strength for transfers.    Baseline  20.53 seconds    Time  4    Period  Weeks        PT Long Term Goals - 10/30/18 1146      PT LONG TERM GOAL #1   Title  Patient will demonstrate proper and safe gait mechanics using SPC with no verbal cues from therapist in order to increase safety in the community.    Time  8    Period  Weeks    Target Date  11/29/18      PT LONG TERM GOAL #2   Title  Patient will increase BERG score to at least 48/50 in order to decrease risk of falls.    Baseline  39/50    Time  8    Period  Weeks    Target Date  11/29/18      PT LONG TERM GOAL #3   Title  Patient will ascend/descend 4 steps with LRAD using a  step to pattern and mod I in order for patient to safely enter and exit her home.    Time  8    Period  Weeks    Target Date  11/29/18      PT LONG TERM GOAL #4   Title  Patient will increase gait speed to at least 1.8 ft/sec in order to help decrease her risk of falls and improve community mobility.    Baseline  1.08 ft/sec    Time  8    Period  Weeks    Target Date  11/29/18      PT LONG TERM GOAL #5   Title  Patient will decrease 5 times sit <> stand time to at least 15 seconds in order to improve functional LE strength for transfers.    Baseline  20.53 seconds    Time  8    Period  Weeks    Target Date  11/29/18             Plan - 10/30/18 1126    Clinical Impression Statement  Pt is a 63 year old female referred to Neuro OPPT for evaluation of functional deficits s/p L thalamic stroke from 06/2018.  Pt's PMH is significant for the following: Hypertension, diabetes, obesity, asthma, hx of CVA, carotid artery disease. The following deficits were noted during pt's exam: decreased LE strength, impaired dynamic balance, gait abnormalities. Pt's Berg, gait speed, and TUG scores indicate pt is at a high risk for falls. Pt would benefit from skilled PT to address these impairments and functional limitations to maximize functional and safe mobility independence in the community.    Personal Factors and Comorbidities  Comorbidity 1;Profession;Past/Current Experience;Behavior Pattern;Comorbidity 2;Comorbidity 3+    Comorbidities  Hypertension, diabetes, obesity, asthma, hx of CVA, carotid artery disease    Examination-Activity Limitations  Locomotion Level;Squat;Stand    Examination-Participation Restrictions  Community Activity   Working as a Pharmacist, hospital, walking at the [employer]  Evolving/Moderate complexity    Clinical Decision Making  Moderate    Rehab Potential  Good    PT Frequency  2x / week    PT Duration  8 weeks  PT Treatment/Interventions  Gait  training;Stair training;Functional mobility training;Therapeutic activities;Therapeutic exercise;Balance training;Patient/family education;Neuromuscular re-education    PT Next Visit Plan  Initiate HEP to focus on balance/LE strength. Assess stairs. Teach correct gait mechanics with SPC. Try AFOs (patient states that she will bring them from home).    Recommended Other Services  OT consult    Consulted and Agree with Plan of Care  Patient       Patient will benefit from skilled therapeutic intervention in order to improve the following deficits and impairments:  Abnormal gait, Decreased balance, Decreased mobility, Decreased coordination, Decreased safety awareness, Decreased strength, Difficulty walking, Decreased activity tolerance  Visit Diagnosis: Unsteadiness on feet   Other abnormalities of gait and mobility  Muscle weakness (generalized)   Hemiplegia and hemiparesis following cerebral infarction affecting right dominant side [facility])      Problem List Patient Active Problem List   Diagnosis Date Noted  . Dermatitis of eyelid, contact or allergic 07/31/2018  . Right sided weakness 07/14/2018  . Left thalamic infarction ([place]) 07/12/2018  . Carotid artery disease ([place]) 07/09/2018  . Numbness and tingling 12/17/2017  . Right foot drop 12/17/2017  . Atherosclerosis of aorta ([place]) 10/09/2017  . Bariatric surgery status, 07/2017 08/19/2017  . Constipation 08/19/2017  . Hypertensive urgency 05/23/2017  . Greater trochanteric bursitis of both hips 12/19/2016  . OSA (obstructive sleep apnea) 07/26/2016  . Hypersomnia 06/11/2016  . Diabetes ([place]) 07/19/2015  . History of colonic polyps 12/01/2014  . Morbidly obese ([place]) 10/05/2013  . Partial nontraumatic tear of right rotator cuff 09/21/2013  . Goiter 12/10/2011  . Asthma 12/10/2011  . 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 History of TIA (transient ischemic attack) 02/13/2011  . PROTEINURIA, MILD 09/15/2009  .  Hyperlipidemia 12/01/2006  . Essential hypertension 07/07/2006    Arliss Journey, PT, DPT 10/30/2018, 1:38 PM  [facility] [address] [place], [place], [address] Phone: [phone]   Fax:  [phone]  Name: Rebekah Kennedy MRN: [id number] Date of Birth: June 25, 1955

## 2018-10-30 NOTE — Telephone Encounter (Signed)
I ordered the referral through epic.  Thank you.

## 2018-10-30 NOTE — Progress Notes (Signed)
   Virtual Visit via Video Note The purpose of this virtual visit is to provide medical care while limiting exposure to the novel coronavirus.    Consent was obtained for video visit:  Yes.   Answered questions that patient had about telehealth interaction:  Yes.   I discussed the limitations, risks, security and privacy concerns of performing an evaluation and management service by telemedicine. I also discussed with the patient that there may be a patient responsible charge related to this service. The patient expressed understanding and agreed to proceed.  Pt location: Home Physician Location: office Name of referring provider:  Binnie Rail, MD I connected with Rebekah Kennedy at patients initiation/request on 10/31/2018 at  9:30 AM EDT by video enabled telemedicine application and verified that I am speaking with the correct person using two identifiers. Pt MRN:  [id number] Pt DOB:  [birth year] Video Participants:  Rebekah Kennedy   History of Present Illness: This is a 63 y.o. female uncontrolled diabetes mellitus (dx 2008, HbA1c 8.2), hyperlipidemia, hypertension, depression, and right pontine stroke (2012, mild left residual weakness) returning to the clinic for left thalamic stroke (06/2018) and right foot drop.  She has not noticed any new improvement in the coordination of her right hand.  She continues to drop things and has difficulty with writing.  Her occupational therapy was disrupted due to the [place] 19 pandemic.  She has restarted this yesterday.  Her right foot weakness is stable and she is able to walk without using her AFO for short distances.  She does use a cane to help with balance.  She is a Chief of Staff, which requires writing, intervention, etc. she is considering taking early retirement due to her medical condition and inability to perform at her highest level.   Observations/Objective:   Vitals:   10/31/18 0806  Weight: 168 lb (76.2 kg)  Height: 5\' 6"   (1.676 m)   Patient is awake, alert, and appears comfortable.  Oriented x 4.   Extraocular muscles are intact. No ptosis.  Face is symmetric.  Speech is not dysarthric. Tongue is midline. Antigravity in all extremities.  No pronator drift. Gait not tested   Assessment and Plan:  1.  Left thalamic infarct due to small vessel disease (06/2018, right sided ataxia) while on plavix, therefore started on aspirin + plavix.  She had prior history of right pontine stroke (2012, mild left hemiparesis).  BP is variable, staying 130-160/80-100.  She was recently started on amlodipine.  Cholesterol is well-controlled on crestor 10mg  daily (LDL 68).  2.  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 No longer using AFO regularly.  I will reassess this with formal neuromuscular exam at her next follow-up.  3.  Diabetic polyneuropathy.  Diabetes is doing much better with A1c 6.7, down fro 8.2.  Continue to use a cane for balance.  Continue physical therapy.   Follow Up Instructions:   I discussed the assessment and treatment plan with the patient. The patient was provided an opportunity to ask questions and all were answered. The patient agreed with the plan and demonstrated an understanding of the instructions.   The patient was advised to call back or seek an in-person evaluation if the symptoms worsen or if the condition fails to improve as anticipated.  Follow-up in 3 months  Total time spent:  20 minutes     Alda Berthold, DO

## 2018-10-30 NOTE — Telephone Encounter (Signed)
Dr. Quay Burow,  Rebekah Kennedy was evaluated by Physical Therapy 10/30/18. The patient would benefit from an OT evaluation for R hand weakness and decreased grip strength. If you agree, please place an order in [place] in Epic or fax the order to [phone].  Thank you, Arliss Journey, PT, DPT

## 2018-10-31 ENCOUNTER — Telehealth (INDEPENDENT_AMBULATORY_CARE_PROVIDER_SITE_OTHER): Payer: BC Managed Care – PPO | Admitting: Neurology

## 2018-10-31 ENCOUNTER — Encounter: Payer: Self-pay | Admitting: Neurology

## 2018-10-31 VITALS — Ht 66.0 in | Wt 168.0 lb

## 2018-10-31 DIAGNOSIS — G5731 Lesion of lateral popliteal nerve, right lower limb: Secondary | ICD-10-CM

## 2018-10-31 DIAGNOSIS — I6381 Other cerebral infarction due to occlusion or stenosis of small artery: Secondary | ICD-10-CM

## 2018-10-31 DIAGNOSIS — I639 Cerebral infarction, unspecified: Secondary | ICD-10-CM

## 2018-11-08 ENCOUNTER — Other Ambulatory Visit: Payer: Self-pay | Admitting: Internal Medicine

## 2018-11-13 ENCOUNTER — Other Ambulatory Visit: Payer: Self-pay

## 2018-11-13 ENCOUNTER — Ambulatory Visit: Payer: BC Managed Care – PPO | Admitting: Physical Therapy

## 2018-11-13 VITALS — BP 134/84 | HR 80

## 2018-11-13 DIAGNOSIS — R2681 Unsteadiness on feet: Secondary | ICD-10-CM

## 2018-11-13 DIAGNOSIS — R2689 Other abnormalities of gait and mobility: Secondary | ICD-10-CM

## 2018-11-13 DIAGNOSIS — M6281 Muscle weakness (generalized): Secondary | ICD-10-CM

## 2018-11-13 DIAGNOSIS — I69351 Hemiplegia and hemiparesis following cerebral infarction affecting right dominant side: Secondary | ICD-10-CM

## 2018-11-13 NOTE — Therapy (Signed)
[facility] [address] [place], [place], [address] Phone: [phone]   Fax:  [phone]  Physical Therapy Treatment  Patient Details  Name: Rebekah Kennedy MRN: [id number] Date of Birth: 03-11-1956 Referring Provider (PT): Billey Gosling, MD  CLINIC OPERATION CHANGES: [facility]patient [facility] is open at lower capacity following universal masking, social distancing, and patient screening.  The patient's COVID risk of complications score is 5.   Encounter Date: 11/13/2018  PT End of Session - 11/13/18 1009    Visit Number  2    Number of Visits  17    Date for PT Re-Evaluation  12/29/18    Authorization Type  BCBS    PT Start Time  0905    PT Stop Time  0950    PT Time Calculation (min)  45 min    Equipment Utilized During Treatment  Gait belt    Activity Tolerance  Patient tolerated treatment well    Behavior During Therapy  WFL for tasks assessed/performed       Past Medical History:  Diagnosis Date  . ASTHMA NOS W/ACUTE EXACERBATION 07/10/2010  . Cervical dysplasia   . DEPRESSION 03/16/2007  . DIABETES MELLITUS, TYPE II 12/01/2006  . Dysmenorrhea   . Fibroid   . HYPERLIPIDEMIA 12/01/2006  . HYPERTENSION 07/07/2006  . Neuropathy   . Obesity   . Osteopenia 05/2017   T score -1.5 FRAX 2.6% / 0.1%  . Retinal edema    gets steriod inj in the eyes    . Sleep apnea    HAS CPAP BUT ADMITS DOES NOT USE JUDICIALLY   . Splenomegaly    in college  . Stroke [facility]) 2012   R pontine, mild residual left hemiparesis  . TIA (transient ischemic attack) 2012   2 TIAs 1 week apart of each other     Past Surgical History:  Procedure Laterality Date  . 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 APPENDECTOMY    . BIOPSY THYROID  05/02/11   Nonneoplastic goiter  . BREAST BIOPSY    . BREAST LUMPECTOMY WITH RADIOACTIVE SEED LOCALIZATION Left 07/25/2017   Procedure: LEFT BREAST LUMPECTOMY WITH RADIOACTIVE SEED LOCALIZATION;  Surgeon:  Coralie Keens, MD;  Location: [facility];  Service: General;  Laterality: Left;  . BREAST SURGERY     Reduction  . COLPOSCOPY    . DILATION AND CURETTAGE OF UTERUS  1975   DUB  . EYE SURGERY     Laser  . GASTRIC ROUX-EN-Y N/A 08/12/2017   Procedure: LAPAROSCOPIC ROUX-EN-Y GASTRIC BYPASS WITH HIATAL HERNIA REPAIR AND UPPER ENDOSCOPY;  Surgeon: [name], [name], MD;  Location[facility];  Service: General;  Laterality: N/A;  . GYNECOLOGIC CRYOSURGERY    . MYOMECTOMY    . OVARIAN CYST REMOVAL    . PELVIC LAPAROSCOPY  75,88   DL lysis of adhesions    Vitals:   11/13/18 0909  BP: 134/84  Pulse: 80    Subjective Assessment - 11/13/18 0905    Subjective  Patient had an almost trip this past Monday when she walking over uneven pavement. Was using her SPC. Reports episodes of R foot crossing over the L. Has noticed difficulty gripping with her R hand; going to OT evaluation in the next couple of weeks.    Pertinent History  L thalamic stroke (06/2018), R pontine stroke (2012), HTN, Uncontrolled diabetes    Limitations  Walking;Writing    Patient Stated Goals  Wants to improve her  balance and get back to walking at the [facility] (when safely able to)                       [facility] Adult PT Treatment/Exercise - 11/13/18 0001      Ambulation/Gait   Ambulation/Gait  Yes    Ambulation/Gait Assistance  5: Supervision;4: Min guard    Ambulation/Gait Assistance Details  Educated and demonstrated to patient how to ambulate with SPC in left hand (patient has been using her RUE) and step through pattern. Patient intiailly ambulates with step to, but is able to correct to step through pattern with cueing. Patient had a few episodes of scissoring, requiring min guard from therapist. Cueing for heel toe pattern. Patient keeps R elbow flexed during gait with no arm swing; needs frequent tactile/verbal cues for R arm swing. Patient originally stated that this pattern felt unnatural, but showed good  carryover by end of session with no cueing from therapist. x2 laps with SPC, x1 lap with small base quad cane.     Ambulation Distance (Feet)  400 Feet    Assistive device  Small based quad cane;Straight cane    Gait Pattern  Step-through pattern;Step-to pattern;Decreased arm swing - right;Decreased stance time - right;Decreased dorsiflexion - right;Narrow base of support;Scissoring    Ambulation Surface  Level    Stairs  Yes    Stairs Assistance  5: Supervision    Stairs Assistance Details (indicate cue type and reason)  Patient ascended/descended using alternating pattern, takes increased time descending when having to use RLE to lower.    Stair Management Technique  Alternating pattern;Two rails    Number of Stairs  8    Height of Stairs  6        Access Code: [email or web address]  URL: [email or web address]  Date: 11/13/2018  Prepared by: Janann August   Initial HEP provided with focus on BLE strengthening (hip ABD).   Exercises Sit to Stand - 10 reps - 1 sets - 2x daily - 7x weekly  --with R foot staggered behind L  Clamshell - 10 reps - 3 sets - 2x daily - 7x weekly Side Stepping with Counter Support - 10 reps - 2 sets - 2x daily - 7x weekly  --cueing to lift L leg off the floor when stepping Heel rises and toe rasies with counter support - 10 reps - 3 sets - 2x daily - 7x weekly    PT Education - 11/13/18 1007    Education Details  Educated patient on correct gait mechanics with SPC, provided initial HEP.    Person(s) Educated  Patient    Methods  Explanation;Handout    Comprehension  Verbalized understanding;Returned demonstration       PT Short Term Goals - 11/13/18 1248      PT SHORT TERM GOAL #1   Title  Patient will be independent with initial HEP to address balance and LE strength to address functional impairments.    Time  4    Period  Weeks    Target Date  12/04/18      PT SHORT TERM GOAL #2   Title  Patient will decrease 5 times sit <> stand score  to 17 seconds from a low surface in order to increase functional LE strength for transfers.    Baseline  20.53 seconds    Time  4    Period  Weeks    Target Date  12/04/18  PT Long Term Goals - 10/30/18 1146      PT LONG TERM GOAL #1   Title  Patient will demonstrate proper and safe gait mechanics using SPC with no verbal cues from therapist in order to increase safety in the community.    Time  8    Period  Weeks    Target Date  11/29/18      PT LONG TERM GOAL #2   Title  Patient will increase BERG score to at least 48/50 in order to decrease risk of falls.    Baseline  39/50    Time  8    Period  Weeks    Target Date  11/29/18      PT LONG TERM GOAL #3   Title  Patient will ascend/descend 4 steps with LRAD using a step to pattern and mod I in order for patient to safely enter and exit her home.    Time  8    Period  Weeks    Target Date  11/29/18      PT LONG TERM GOAL #4   Title  Patient will increase gait speed to at least 1.8 ft/sec in order to help decrease her risk of falls and improve community mobility.    Baseline  1.08 ft/sec    Time  8    Period  Weeks    Target Date  11/29/18      PT LONG TERM GOAL #5   Title  Patient will decrease 5 times sit <> stand time to at least 15 seconds in order to improve functional LE strength for transfers.    Baseline  20.53 seconds    Time  8    Period  Weeks    Target Date  11/29/18            Plan - 11/13/18 1009    Clinical Impression Statement  Focus of today's session was gait training with SPC. Taught patient to hold SPC in L hand for increased stability (vs. R) and proper step through pattern. Patient with good carryover on proper gait mechanics by end of session without cueing from therapist. Had a couple brief episodes of scissoring during gait. Initial HEP was directed towards strengthening BLE with focus on bilateral hip abductors to help decrease scissoring and normalize gait pattern. Will continue to  progress towards LTGs.    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 Progress hip ABD strengthening. Gait on uneven surfaces, curbs. Static standing balance (EC, tandem stance).    PT Home Exercise Plan  [id number]    Consulted and Agree with Plan of Care  Patient       Patient will benefit from skilled therapeutic intervention in order to improve the following deficits and impairments:  Abnormal gait, Decreased balance, Decreased mobility, Decreased coordination, Decreased safety awareness, Decreased  strength, Difficulty walking, Decreased activity tolerance  Visit Diagnosis: 1. Unsteadiness on feet   2. Other abnormalities of gait and mobility   3. Muscle weakness (generalized)   4. Hemiplegia and hemiparesis following cerebral infarction affecting right dominant side [facility])        Problem List Patient Active Problem List   Diagnosis Date Noted  . Dermatitis of eyelid, contact or allergic 07/31/2018  . Right sided weakness 07/14/2018  . Left thalamic infarction ([place]) 07/12/2018  . Carotid artery disease ([place]) 07/09/2018  . Numbness and tingling 12/17/2017  . Right foot drop 12/17/2017  .  Atherosclerosis of aorta ([place]) 10/09/2017  . Bariatric surgery status, 07/2017 08/19/2017  . Constipation 08/19/2017  . Hypertensive urgency 05/23/2017  . Greater trochanteric bursitis of both hips 12/19/2016  . OSA (obstructive sleep apnea) 07/26/2016  . Hypersomnia 06/11/2016  . Diabetes ([place]) 07/19/2015  . History of colonic polyps 12/01/2014  . Morbidly obese ([place]) 10/05/2013  . Partial nontraumatic tear of right rotator cuff 09/21/2013  . Goiter 12/10/2011  . Asthma 12/10/2011  . 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 History of TIA (transient ischemic attack) 02/13/2011  . PROTEINURIA, MILD 09/15/2009  . Hyperlipidemia 12/01/2006  . Essential hypertension 07/07/2006    Arliss Journey, PT, DPT 11/13/2018, 12:49 PM  [facility] [address] [place], [place], [address] Phone: [phone]   Fax:  [phone]  Name: Rebekah Kennedy MRN: [id number] Date of Birth: 09/01/1955

## 2018-11-13 NOTE — Patient Instructions (Signed)
Access Code: [email or web address]  URL: [email or web address]  Date: 11/13/2018  Prepared by: Janann August    Exercises Sit to Stand - 10 reps - 1 sets - 2x daily - 7x weekly  --with R foot staggered behind L  Clamshell - 10 reps - 3 sets - 2x daily - 7x weekly Side Stepping with Counter Support - 10 reps - 2 sets - 2x daily - 7x weekly Heel rises and toe rasies with counter support - 10 reps - 3 sets - 2x daily - 7x weekly

## 2018-11-17 ENCOUNTER — Telehealth: Payer: Self-pay | Admitting: Internal Medicine

## 2018-11-17 NOTE — Telephone Encounter (Signed)
Patient came into the office to drop off a Short Term Disability form to be completed by Dr Quay Burow. She would like a call when it is ready to be completed. Placed in Brittany's box for completion.

## 2018-11-18 NOTE — Telephone Encounter (Signed)
Form completed & placed in providers box to review and sign.

## 2018-11-19 NOTE — Telephone Encounter (Signed)
Form has been signed, Faxed to [place] [employer] [phone], Copy sent to scan.   Patient informed, original mailed to her for her records.

## 2018-11-26 ENCOUNTER — Other Ambulatory Visit: Payer: Self-pay

## 2018-11-26 ENCOUNTER — Ambulatory Visit: Payer: BC Managed Care – PPO | Admitting: Occupational Therapy

## 2018-11-26 ENCOUNTER — Encounter: Payer: Self-pay | Admitting: Occupational Therapy

## 2018-11-26 ENCOUNTER — Ambulatory Visit
Payer: BC Managed Care – PPO | Attending: Internal Medicine | Admitting: Rehabilitative and Restorative Service Providers"

## 2018-11-26 VITALS — BP 142/88

## 2018-11-26 DIAGNOSIS — R278 Other lack of coordination: Secondary | ICD-10-CM | POA: Diagnosis present

## 2018-11-26 DIAGNOSIS — R2681 Unsteadiness on feet: Secondary | ICD-10-CM | POA: Diagnosis not present

## 2018-11-26 DIAGNOSIS — R293 Abnormal posture: Secondary | ICD-10-CM | POA: Diagnosis present

## 2018-11-26 DIAGNOSIS — M6281 Muscle weakness (generalized): Secondary | ICD-10-CM | POA: Insufficient documentation

## 2018-11-26 DIAGNOSIS — I69351 Hemiplegia and hemiparesis following cerebral infarction affecting right dominant side: Secondary | ICD-10-CM | POA: Insufficient documentation

## 2018-11-26 DIAGNOSIS — R2689 Other abnormalities of gait and mobility: Secondary | ICD-10-CM | POA: Diagnosis present

## 2018-11-26 NOTE — Therapy (Signed)
[facility] [address] [place], [place], [address] Phone: [phone]   Fax:  [phone]  Physical Therapy Treatment  Patient Details  Name: RESHONDA Kennedy MRN: [id number] Date of Birth: April 24, 1956 Referring Provider (PT): Billey Gosling, MD  CLINIC OPERATION CHANGES: Outpatient [facility]hab is open at lower capacity following universal masking, social distancing, and patient screening.  The patient's COVID risk of complications score is 5.   Encounter Date: 11/26/2018  PT End of Session - 11/26/18 1132    Visit Number  3    Number of Visits  17    Date for PT Re-Evaluation  12/29/18    Authorization Type  BCBS    PT Start Time  0906    PT Stop Time  0953    PT Time Calculation (min)  47 min    Equipment Utilized During Treatment  Gait belt    Activity Tolerance  Patient tolerated treatment well    Behavior During Therapy  WFL for tasks assessed/performed       Past Medical History:  Diagnosis Date  . ASTHMA NOS W/ACUTE EXACERBATION 07/10/2010  . Cervical dysplasia   . DEPRESSION 03/16/2007  . DIABETES MELLITUS, TYPE II 12/01/2006  . Dysmenorrhea   . Fibroid   . HYPERLIPIDEMIA 12/01/2006  . HYPERTENSION 07/07/2006  . Neuropathy   . Obesity   . Osteopenia 05/2017   T score -1.5 FRAX 2.6% / 0.1%  . Retinal edema    gets steriod inj in the eyes    . Sleep apnea    HAS CPAP BUT ADMITS DOES NOT USE JUDICIALLY   . Splenomegaly    in college  . Stroke [facility]) 2012   R pontine, mild residual left hemiparesis  . TIA (transient ischemic attack) 2012   2 TIAs 1 week apart of each other     Past Surgical History:  Procedure Laterality Date  . 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 APPENDECTOMY    . BIOPSY THYROID  05/02/11   Nonneoplastic goiter  . BREAST BIOPSY    . BREAST LUMPECTOMY WITH RADIOACTIVE SEED LOCALIZATION Left 07/25/2017   Procedure: LEFT BREAST LUMPECTOMY WITH RADIOACTIVE SEED LOCALIZATION;  Surgeon:  Coralie Keens, MD;  Location: [facility];  Service: General;  Laterality: Left;  . BREAST SURGERY     Reduction  . COLPOSCOPY    . DILATION AND CURETTAGE OF UTERUS  1975   DUB  . EYE SURGERY     Laser  . GASTRIC ROUX-EN-Y N/A 08/12/2017   Procedure: LAPAROSCOPIC ROUX-EN-Y GASTRIC BYPASS WITH HIATAL HERNIA REPAIR AND UPPER ENDOSCOPY;  Surgeon: [name], [name], MD;  [facility];  Service: General;  Laterality: N/A;  . GYNECOLOGIC CRYOSURGERY    . MYOMECTOMY    . OVARIAN CYST REMOVAL    . PELVIC LAPAROSCOPY  75,88   DL lysis of adhesions    Vitals:   11/26/18 0911  BP: (!) 142/88    Subjective Assessment - 11/26/18 0909    Subjective  No falls since last visit. Exercises has been going well, has been trying to use the cane more in her L hand and she states that it is feeling a little less foreign.    Pertinent History  L thalamic stroke (06/2018), R pontine stroke (2012), HTN, Uncontrolled diabetes    Limitations  Walking;Writing    Patient Stated Goals  Wants to improve her balance and get back to walking at the [facility] (when safely able to)  [facility] Adult PT Treatment/Exercise - 11/26/18 1146      Transfers   Sit to Stand  5: Supervision    Sit to Stand Details  Verbal cues for sequencing;Verbal cues for technique;Verbal cues for precautions/safety    Sit to Stand Details (indicate cue type and reason)  2 x 5 reps; with focus on eccentric control. 1 x 5 reps lowering halfway to perform mini squats and back to standing in order to work on eccentric control for transfers.      Ambulation/Gait   Ambulation/Gait  Yes    Ambulation/Gait Assistance  5: Supervision;4: Min guard    Ambulation/Gait Assistance Details  Went over proper gait mechanics with SPC with fair carryover from last session. Patient has difficulty with dual tasking while ambulating and gait pattern becomes less fluid with more episodes of scissoring and with no reciprocal arm  swing and improper use of SPC. She continues to have a tendency to lift her cane off the floor while ambulating.     Ambulation Distance (Feet)  500 Feet    Assistive device  Straight cane    Gait Pattern  Step-through pattern;Step-to pattern;Decreased arm swing - right;Decreased stance time - right;Decreased dorsiflexion - right;Narrow base of support;Scissoring;Poor foot clearance - right    Ambulation Surface  Level;Unlevel;Indoor;Outdoor    Stairs  Yes    Stairs Assistance  5: Supervision    Stairs Assistance Details (indicate cue type and reason)  Patient used alternating pattern with both rails.    Stair Management Technique  Alternating pattern;Two rails    Number of Stairs  8    Height of Stairs  6    Curb  5: Supervision;4: Min assist    Curb Details (indicate cue type and reason)  x4 reps (up/down) with SPC, discussed with patient ascending with stronger leg and descending with weaker leg and cane placement. Patient with poor carryover, able to understand technique once given mnemonic "up with the good, down with the bad"        Exercises   Exercises  Knee/Hip        Standing hip ABD side steps with red theraband: 2 x 10 reps bilaterally with chair in front for occasional UE support. Cueing for leading with heel and foot clearance while stepping back to midline.      PT Education - 11/26/18 1131    Education Details  went over correct gait pattern with [facility], how to safely ascend/descend curb with [facility]    Person(s) Educated  Patient    Methods  Explanation;Demonstration    Comprehension  Verbalized understanding;Returned demonstration;Need further instruction;Verbal cues required       PT Short Term Goals - 11/13/18 1248      PT SHORT TERM GOAL #1   Title  Patient will be independent with initial HEP to address balance and LE strength to address functional impairments.    Time  4    Period  Weeks    Target Date  12/04/18      PT SHORT TERM GOAL #2   Title  Patient  will decrease 5 times sit <> stand score to 17 seconds from a low surface in order to increase functional LE strength for transfers.    Baseline  20.53 seconds    Time  4    Period  Weeks    Target Date  12/04/18        PT Long Term Goals - 11/26/18 1137      PT LONG  TERM GOAL #1   Title  Patient will demonstrate proper and safe gait mechanics using SPC with no verbal cues from therapist in order to increase safety in the community.    Time  8    Period  Weeks    Target Date  12/26/18      PT LONG TERM GOAL #2   Title  Patient will increase BERG score to at least 48/50 in order to decrease risk of falls.    Baseline  39/50    Time  8    Period  Weeks    Target Date  12/26/18      PT LONG TERM GOAL #3   Title  Patient will ascend/descend 4 steps with LRAD using a step to pattern and mod I in order for patient to safely enter and exit her home.    Time  8    Period  Weeks    Target Date  12/26/18      PT LONG TERM GOAL #4   Title  Patient will increase gait speed to at least 1.8 ft/sec in order to help decrease her risk of falls and improve community mobility.    Baseline  1.08 ft/sec    Time  8    Period  Weeks    Target Date  12/26/18      PT LONG TERM GOAL #5   Title  Patient will decrease 5 times sit <> stand time to at least 15 seconds in order to improve functional LE strength for transfers.    Baseline  20.53 seconds    Time  8    Period  Weeks    Target Date  12/26/18            Plan - 11/26/18 1133    Clinical Impression Statement  Focus of today's session was review of gait mechanics with SPC. Patient had fair carryover from last session, but when presented with dual tasking (talking while walking), patient's gait pattern becomes less fluid with SPC with intermittent episodes of scissoring. Patient has difficulty with carryover when going over correct and safe mechanics for ascending/descending curbs with SPC. Was able to perform correctly after multiple  trials and verbal cueing. Patient would continue to benefit from PT in order to reach LTGs.    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 Go over with patient previous HEP from home health PT (combine/progress exercises). Half turns/quarter turns with SPC. Progress hip ABD strengthening. Gait on uneven surfaces, curbs. Static standing balance (EC, tandem stance).    PT Home Exercise Plan  [email or web address]    Consulted and Agree with Plan of Care  Patient       Patient will benefit from skilled therapeutic intervention in order to improve the following deficits and impairments:  Abnormal gait, Decreased balance, Decreased mobility, Decreased coordination, Decreased safety awareness, Decreased strength, Difficulty  walking, Decreased activity tolerance  Visit Diagnosis: 1. Unsteadiness on feet   2. Other abnormalities of gait and mobility   3. Muscle weakness (generalized)   4. Hemiplegia and hemiparesis following cerebral infarction affecting right dominant side [facility])        Problem List Patient Active Problem List   Diagnosis Date Noted  . Dermatitis of eyelid, contact or allergic 07/31/2018  . Right sided weakness 07/14/2018  . Left thalamic infarction  ([place]) 07/12/2018  . Carotid artery disease ([place]) 07/09/2018  . Numbness and tingling 12/17/2017  . Right foot drop 12/17/2017  . Atherosclerosis of aorta ([place]) 10/09/2017  . Bariatric surgery status, 07/2017 08/19/2017  . Constipation 08/19/2017  . Hypertensive urgency 05/23/2017  . Greater trochanteric bursitis of both hips 12/19/2016  . OSA (obstructive sleep apnea) 07/26/2016  . Hypersomnia 06/11/2016  . Diabetes ([place]) 07/19/2015  . History of colonic polyps 12/01/2014  . Morbidly obese ([place]) 10/05/2013  . Partial nontraumatic tear of right rotator cuff 09/21/2013  . Goiter 12/10/2011  . Asthma 12/10/2011  . 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 History of TIA (transient ischemic attack) 02/13/2011  . PROTEINURIA, MILD 09/15/2009  . Hyperlipidemia 12/01/2006  . Essential hypertension 07/07/2006    Arliss Journey, PT, DPT 11/26/2018, 2:59 PM  [facility] [address] [place], [place], [address] Phone: [phone]   Fax:  [phone]  Name: MALEEKA SABATINO MRN: [id number] Date of Birth: 1955/06/24

## 2018-11-26 NOTE — Therapy (Signed)
[facility] [address] [place], [place], [address] Phone: [phone]   Fax:  [phone]  Occupational Therapy Evaluation  Patient Details  Name: Rebekah Kennedy MRN: [id number] Date of Birth: July 14, 1955 Referring Provider (OT): Dr. Billey Gosling   Encounter Date: 11/26/2018  OT End of Session - 11/26/18 2054    Visit Number  1    Number of Visits  16   pt may not need all 16 visits will be based on progress toward goals   Date for OT Re-Evaluation  01/21/19    Authorization Type  BCBS [place]    OT Start Time  1810    OT Stop Time  1835    OT Time Calculation (min)  25 min    Activity Tolerance  Patient tolerated treatment well       Past Medical History:  Diagnosis Date  . ASTHMA NOS W/ACUTE EXACERBATION 07/10/2010  . Cervical dysplasia   . DEPRESSION 03/16/2007  . DIABETES MELLITUS, TYPE II 12/01/2006  . Dysmenorrhea   . Fibroid   . HYPERLIPIDEMIA 12/01/2006  . HYPERTENSION 07/07/2006  . Neuropathy   . Obesity   . Osteopenia 05/2017   T score -1.5 FRAX 2.6% / 0.1%  . Retinal edema    gets steriod inj in the eyes    . Sleep apnea    HAS CPAP BUT ADMITS DOES NOT USE JUDICIALLY   . Splenomegaly    in college  . Stroke [facility]) 2012   R pontine, mild residual left hemiparesis  . TIA (transient ischemic attack) 2012   2 TIAs 1 week apart of each other     Past Surgical History:  Procedure Laterality Date  . 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 APPENDECTOMY    . BIOPSY THYROID  05/02/11   Nonneoplastic goiter  . BREAST BIOPSY    . BREAST LUMPECTOMY WITH RADIOACTIVE SEED LOCALIZATION Left 07/25/2017   Procedure: LEFT BREAST LUMPECTOMY WITH RADIOACTIVE SEED LOCALIZATION;  Surgeon: Coralie Keens, MD;  Location: [facility] [place];  Service: General;  Laterality: Left;  . BREAST SURGERY     Reduction  . COLPOSCOPY    . DILATION AND CURETTAGE OF UTERUS  1975   DUB  . EYE SURGERY     Laser  . GASTRIC ROUX-EN-Y N/A  08/12/2017   Procedure: LAPAROSCOPIC ROUX-EN-Y GASTRIC BYPASS WITH HIATAL HERNIA REPAIR AND UPPER ENDOSCOPY;  Surgeon: [name], [name], MD;  Location[facility];  Service: General;  Laterality: N/A;  . GYNECOLOGIC CRYOSURGERY    . MYOMECTOMY    . OVARIAN CYST REMOVAL    . PELVIC LAPAROSCOPY  75,88   DL lysis of adhesions    There were no vitals filed for this visit.  Subjective Assessment - 11/26/18 1811    Subjective   Sometimes my R hand has mind of its own    Pertinent History  Pt with L thalamic stroke in Feb 2020 as well as R pontine stroke in 2012.  HTN, Diabetes, HLD, depression.    Patient Stated Goals  My R hand sometimes does its own thing and I want to be able to write better.    Currently in Pain?  No/denies        [facility] OT Assessment - 11/26/18 0001      Assessment   Medical Diagnosis  L thalamic stroke     Referring Provider (OT)  Dr. Billey Gosling    Onset Date/Surgical Date  07/07/18  Hand Dominance  Right    Prior Therapy  HHPT and HHOT      Precautions   Precautions  Fall      Restrictions   Weight Bearing Restrictions  No      Balance Screen   Has the patient fallen in the past 6 months  Yes   Pt seeing PT in this clinic currently   How many times?  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    Leisure  walking at the [employer]      ADL   Eating/Feeding  Modified independent    Grooming  Modified independent    Upper Body Bathing  Modified independent    Lower Body Bathing   Modified independent    Upper Body Dressing  Independent    Lower Body Dressing  Modified independent    Toilet Transfer  Modified independent    [place] independent    [place] Transfer  Modified independent      IADL   Shopping  Needs to be accompanied on any shopping trip   feels she can go alone but hasn't yet   Light Housekeeping  Performs light daily tasks such as dishwashing, bed making    Meal Prep  Able to complete simple warm meal prep    Community Mobility  Drives own vehicle    Medication Management  Is responsible for taking medication in correct dosages at correct time    Psychiatrist financial matters independently (budgets, writes checks, pays rent, bills goes to bank), collects and keeps track of income      Mobility   Mobility Status  Independent    Mobility Status Comments  feels she is safe to walk outside by herself but 'I have't had much opportunity"      Written Expression   Dominant Hand  Right    Handwriting  75% legible   printing first name more legible than cursive     Vision - History   Baseline Vision  Other (comment)    Additional Comments  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 and gets injections every few months - reports this is helping      Vision Assessment   Comment  Pt denies any visual changes from stroke      Activity Tolerance   Activity Tolerance  Tolerates 10-20 min activity with multiple rests    Activity Tolerance Comments  Pt states she can't walk long distances for example can't go into a store and shop       Cognition   Overall Cognitive Status  Within Functional Limits for tasks assessed      Sensation   Light Touch  Appears Intact    Hot/Cold  Appears Intact    Proprioception  Appears Intact  Additional Comments  pt reports that she has tingling in R finger tips but no pain       Coordination   Gross Motor Movements are  Fluid and Coordinated  Yes    Fine Motor Movements are Fluid and Coordinated  No    Finger Nose Finger Test  moderately impaired.      9 Hole Peg Test  Right;Left    Right 9 Hole Peg Test  31.93    Left 9 Hole Peg Test  22.77      Tone   Assessment Location  Right Upper Extremity      ROM / Strength   AROM / PROM / Strength  AROM;Strength      AROM   Overall AROM   Within functional limits for tasks performed    Overall AROM Comments  BUE's      Hand Function   Right Hand Gross Grasp  Functional    Right Hand Grip (lbs)  42    Left Hand Gross Grasp  Functional    Left Hand Grip (lbs)  40      RUE Tone   RUE Tone  Modified Ashworth      RUE Tone   Modified Ashworth Scale for Grading Hypertonia RUE  Slight increase in muscle tone, manifested by a catch and release or by minimal resistance at the end of the range of motion when the affected part(s) is moved in flexion or extension                        OT Short Term Goals - 11/26/18 2055      OT SHORT TERM GOAL #1   Title  Pt will be mod I for RUE coordination HEP - 12/24/2018    Status  New      OT SHORT TERM GOAL #2   Title  Pt will demonstrate improved coordination as evidenced by decreasing time on 9 hole peg by at least 4 seconds to assist with fine motor tasks    Baseline  31.93    Status  New      OT SHORT TERM GOAL #3   Title  Pt will report greater ease in using RUE to brush teeth    Status  New        OT Long Term Goals - 11/26/18 2039      OT LONG TERM GOAL #1   Title  Pt will be mod I with upgraded HEP for functional use of RUE/balance/activity tolerance - 01/21/2019    Status  New      OT LONG TERM GOAL #2   Title  Pt will demonstrate improved RUE coordination as evidenced by decreasing time on 9 hole peg test by at least 6 seconds to assist with fine motor tasks    Baseline  31.93    Status  New      OT LONG TERM GOAL #3   Title  Pt will demonstrate ability to use RUE to pick up,  drink and set cup down without spilling using RUE.    Status  New      OT LONG TERM GOAL #4   Title  Pt will be able to write 3 sentence paragraph legibly, AE prn    Status  New      OT LONG TERM GOAL #5   Title  Pt will demonstrate ability to walk outside on uneven ground mod I for return to leisure and  work related activities    Status  New      OT [place] #6   Title  Pt will demonstrate ability to complete simple shopping task mod I    Status  New            Plan - 11/26/18 2043    Clinical Impression Statement  Pt is a 63 year old female s/p L thalamic CVA on 07/07/2018. PMH: includes R pontine stroke 2012, HTN, HLD, diabetes, depression, asthma, obesity. Pt presents with the following impairments that impact functional use of RUE as well as independence in IADL's, work and leisure:  impaired RUE coordination, sudden intermittent spasm of RUE when attempting to use functionally, mild spasticity RUE,abnormal posture, impaired balance, decreased activity tolerance. Pt will benefit from skilled OT to address these deficits to maximize independence.    OT Occupational Profile and History  Detailed Assessment- Review of Records and additional review of physical, cognitive, psychosocial history related to current functional performance    Occupational performance deficits (Please refer to evaluation for details):  ADL's;IADL's;Work;Leisure;Social Participation    Body Structure / Function / Physical Skills  ADL;Balance;Coordination;FMC;GMC;IADL;Muscle spasms;Tone;UE functional use    Rehab Potential  Good    Clinical Decision Making  Several treatment options, min-mod task modification necessary    Comorbidities Affecting Occupational Performance:  May have comorbidities impacting occupational performance    Modification or Assistance to Complete Evaluation   Min-Moderate modification of tasks or assist with assess necessary to complete eval    OT Frequency  2x / week    OT  Duration  8 weeks    OT Treatment/Interventions  Self-care/ADL training;Aquatic Therapy;DME and/or AE instruction;Neuromuscular education;Therapeutic exercise;Functional Mobility Training;Manual Therapy;Therapeutic activities;Patient/family education;Balance training    Consulted and Agree with Plan of Care  Patient       Patient will benefit from skilled therapeutic intervention in order to improve the following deficits and impairments:   Body Structure / Function / Physical Skills: ADL, Balance, Coordination, FMC, GMC, IADL, Muscle spasms, Tone, UE functional use       Visit Diagnosis: 1. Hemiplegia and hemiparesis following cerebral infarction affecting right dominant side (HCC)   2. Other lack of coordination   3. Abnormal posture   4. Unsteadiness on feet       Problem List Patient Active Problem List   Diagnosis Date Noted  . Dermatitis of eyelid, contact or allergic 07/31/2018  . Right sided weakness 07/14/2018  . Left thalamic infarction ([place]) 07/12/2018  . Carotid artery disease ([place]) 07/09/2018  . Numbness and tingling 12/17/2017  . Right foot drop 12/17/2017  . Atherosclerosis of aorta ([place]) 10/09/2017  . Bariatric surgery status, 07/2017 08/19/2017  . Constipation 08/19/2017  . Hypertensive urgency 05/23/2017  . Greater trochanteric bursitis of both hips 12/19/2016  . OSA (obstructive sleep apnea) 07/26/2016  . Hypersomnia 06/11/2016  . Diabetes ([place]) 07/19/2015  . History of colonic polyps 12/01/2014  . Morbidly obese ([place]) 10/05/2013  . Partial nontraumatic tear of right rotator cuff 09/21/2013  . Goiter 12/10/2011  . Asthma 12/10/2011  . 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 History of TIA (transient ischemic attack) 02/13/2011  . PROTEINURIA, MILD 09/15/2009  . Hyperlipidemia 12/01/2006  . Essential hypertension 07/07/2006    [facility] 11/26/2018, 9:04 PM  [facility] [address] [place], [place], [address] Phone: [phone]   Fax:  [phone]  Name: GENINE BECKETT MRN: [id number] Date of Birth:  10/06/1955 

## 2018-11-28 ENCOUNTER — Other Ambulatory Visit: Payer: Self-pay

## 2018-11-28 ENCOUNTER — Encounter: Payer: Self-pay | Admitting: Physical Therapy

## 2018-11-28 ENCOUNTER — Ambulatory Visit: Payer: BC Managed Care – PPO | Admitting: Physical Therapy

## 2018-11-28 DIAGNOSIS — R2689 Other abnormalities of gait and mobility: Secondary | ICD-10-CM

## 2018-11-28 DIAGNOSIS — R2681 Unsteadiness on feet: Secondary | ICD-10-CM

## 2018-11-28 DIAGNOSIS — M6281 Muscle weakness (generalized): Secondary | ICD-10-CM

## 2018-11-28 DIAGNOSIS — I69351 Hemiplegia and hemiparesis following cerebral infarction affecting right dominant side: Secondary | ICD-10-CM

## 2018-11-28 NOTE — Therapy (Signed)
[facility] [address] [place], [place], [address] Phone: [phone]   Fax:  [phone]  Physical Therapy Treatment  Patient Details  Name: Rebekah Kennedy MRN: [id number] Date of Birth: 09/19/1955 Referring Provider (PT): Billey Gosling, MD   Encounter Date: 11/28/2018   CLINIC OPERATION CHANGES: Outpatient [facility] is open at lower capacity following universal masking, social distancing, and patient screening.  The patient's COVID risk of complications score is 5.   PT End of Session - 11/28/18 0954    Visit Number  4    Number of Visits  17    Date for PT Re-Evaluation  12/29/18    Authorization Type  BCBS    PT Start Time  0905    PT Stop Time  0948    PT Time Calculation (min)  43 min    Activity Tolerance  Patient tolerated treatment well    Behavior During Therapy  [facility] for tasks assessed/performed       Past Medical History:  Diagnosis Date  . ASTHMA NOS W/ACUTE EXACERBATION 07/10/2010  . Cervical dysplasia   . DEPRESSION 03/16/2007  . DIABETES MELLITUS, TYPE II 12/01/2006  . Dysmenorrhea   . Fibroid   . HYPERLIPIDEMIA 12/01/2006  . HYPERTENSION 07/07/2006  . Neuropathy   . Obesity   . Osteopenia 05/2017   T score -1.5 FRAX 2.6% / 0.1%  . Retinal edema    gets steriod inj in the eyes    . Sleep apnea    HAS CPAP BUT ADMITS DOES NOT USE JUDICIALLY   . Splenomegaly    in college  . Stroke [facility]) 2012   R pontine, mild residual left hemiparesis  . TIA (transient ischemic attack) 2012   2 TIAs 1 week apart of each other     Past Surgical History:  Procedure Laterality Date  . 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 APPENDECTOMY    . BIOPSY THYROID  05/02/11   Nonneoplastic goiter  . BREAST BIOPSY    . BREAST LUMPECTOMY WITH RADIOACTIVE SEED LOCALIZATION Left 07/25/2017   Procedure: LEFT BREAST LUMPECTOMY WITH RADIOACTIVE SEED LOCALIZATION;  Surgeon: Coralie Keens, MD;  Location: [facility];   Service: General;  Laterality: Left;  . BREAST SURGERY     Reduction  . COLPOSCOPY    . DILATION AND CURETTAGE OF UTERUS  1975   DUB  . EYE SURGERY     Laser  . GASTRIC ROUX-EN-Y N/A 08/12/2017   Procedure: LAPAROSCOPIC ROUX-EN-Y GASTRIC BYPASS WITH HIATAL HERNIA REPAIR AND UPPER ENDOSCOPY;  Surgeon: [name], [name], MD;  [facility];  Service: General;  Laterality: N/A;  . GYNECOLOGIC CRYOSURGERY    . MYOMECTOMY    . OVARIAN CYST REMOVAL    . PELVIC LAPAROSCOPY  75,88   DL lysis of adhesions    There were no vitals filed for this visit.  Subjective Assessment - 11/28/18 0909    Subjective  Brought in AFO that she had before the CVA; white off the shelf plastic PLS.  Wears the plastic AFO in the community mostly, less cumbersome to don compared to carbon fiber AFO.  Pt is planning to move back into her home soon.  Still needs the information on resources for someone to install rails.    Pertinent History  L thalamic stroke (06/2018), R pontine stroke (2012), HTN, Uncontrolled diabetes    Limitations  Walking;Writing    Patient Stated Goals  Wants to improve her balance and  get back to walking at the [facility] (when safely able to)    Currently in Pain?  No/denies         [facility] PT Assessment - 11/28/18 0919      Ambulation/Gait   Ambulation/Gait  Yes    Ambulation/Gait Assistance  6: Modified independent (Device/Increase time)    Ambulation/Gait Assistance Details  Pt has been practicing ambulating with cane in LUE; pt demonstrates improved gait sequence with cane on L side, pt did not demonstrate carrying the cane in the air today    Ambulation Distance (Feet)  230 Feet    Assistive device  Straight cane    Gait Pattern  Step-through pattern;Narrow base of support;Lateral hip instability;Decreased dorsiflexion - right    Ambulation Surface  Level;Indoor      Standardized Balance Assessment   Standardized Balance Assessment  10 meter walk test;Five Times Sit to Stand;Berg  Balance Test    Five times sit to stand comments   21.69 from chair without UE support and supervision from therapist    10 Meter Walk  13.47 seconds or 2.43 ft/sec      Berg Balance Test   Sit to Stand  Able to stand without using hands and stabilize independently    Standing Unsupported  Able to stand safely 2 minutes    Sitting with Back Unsupported but Feet Supported on Floor or Stool  Able to sit safely and securely 2 minutes    Stand to Sit  Sits safely with minimal use of hands    Transfers  Able to transfer safely, minor use of hands    Standing Unsupported with Eyes Closed  Able to stand 10 seconds with supervision    Standing Unsupported with Feet Together  Able to place feet together independently and stand for 1 minute with supervision    From Standing, Reach Forward with Outstretched Arm  Can reach confidently >25 cm (10")    From Standing Position, Pick up Object from Floor  Able to pick up shoe, needs supervision    From Standing Position, Turn to Look Behind Over each Shoulder  Looks behind one side only/other side shows less weight shift    Turn 360 Degrees  Able to turn 360 degrees safely in 4 seconds or less    Standing Unsupported, Alternately Place Feet on Step/Stool  Able to complete >2 steps/needs minimal assist    Standing Unsupported, One Foot in Front  Able to plae foot ahead of the other independently and hold 30 seconds    Standing on One Leg  Able to lift leg independently and hold 5-10 seconds    Total Score  47    Berg comment:  47/56                           PT Education - 11/28/18 0951    Education Details  progress toward goals, upgrading LTG and areas to continue to focus on    Person(s) Educated  Patient    Methods  Explanation    Comprehension  Verbalized understanding       PT Short Term Goals - 11/28/18 4650      PT SHORT TERM GOAL #1   Title  Patient will be independent with initial HEP to address balance and LE strength  to address functional impairments.    Time  4    Period  Weeks    Status  Achieved    Target Date  11/29/18      PT SHORT TERM GOAL #2   Title  Patient will decrease 5 times sit <> stand score to 17 seconds from a low surface in order to increase functional LE strength for transfers.    Baseline  20.53 seconds > 21.69 seconds from chair without use of UE and without therapist assistance    Time  4    Period  Weeks    Status  Partially Met    Target Date  11/29/18        PT Long Term Goals - 11/28/18 0940      PT LONG TERM GOAL #1   Title  Patient will demonstrate proper and safe gait mechanics using SPC with no verbal cues from therapist in order to increase safety in the community.  (LTG due by 12/29/18)    Time  8    Period  Weeks    Target Date  12/29/18      PT LONG TERM GOAL #2   Title  Patient will increase BERG score to at least 52/56 in order to decrease risk of falls.    Baseline  39/56 > 47/56    Time  8    Period  Weeks    Status  Revised      PT LONG TERM GOAL #3   Title  Patient will ascend/descend 4 steps with LRAD using a step over step pattern and mod I in order for patient to safely enter and exit her home.    Baseline  4 with 2 rails with alternating sequence supervision    Time  8    Period  Weeks    Status  Revised      PT LONG TERM GOAL #4   Title  Patient will increase gait speed to at least 2.8 ft/sec with cane in order to help decrease her risk of falls and improve community mobility.    Baseline  1.08 ft/sec > 2.4 ft/sec with cane on L side    Time  8    Period  Weeks    Status  Revised      PT LONG TERM GOAL #5   Title  Patient will decrease 5 times sit <> stand time to at least 17 seconds from chair without UE support in order to improve functional LE strength for transfers.    Baseline  20.53 seconds    Time  8    Period  Weeks    Status  Revised            Plan - 11/28/18 0946    Clinical Impression Statement  Treatment session  focused on assessment of progress towards goals.  Despite this being her 4th PT visit pt has made good progress and demonstrates safer gait sequence with cane in LUE, increased gait velocity, improved static and dynamic standing balance and improved LE strength with overall decreased falls risk.  LTG updated.  Reviewed with pt areas of impairment PT will continue to focus on.  Pt in agreement.  Will continue to progress towards LTG.    Personal Factors and Comorbidities  Comorbidity 1;Profession;Past/Current Experience;Behavior Pattern;Comorbidity 2;Comorbidity 3+    Comorbidities  Hypertension, diabetes, obesity, asthma, hx of CVA, carotid artery disease    Examination-Activity Limitations  Locomotion Level;Squat;Stand    Examination-Participation Restrictions  Community Activity   Working as a Pharmacist, hospital, walking at the [employer] Making  Evolving/Moderate complexity    Rehab Potential  Good  PT Frequency  2x / week    PT Duration  8 weeks    PT Treatment/Interventions  Gait training;Stair training;Functional mobility training;Therapeutic activities;Therapeutic exercise;Balance training;Patient/family education;Neuromuscular re-education    PT Next Visit Plan  Give information about resources for installing rails for home entry.  Go over with patient previous HEP from home health PT (combine/progress exercises). Balance with more narrow BOS/compliant surfaces/eyes closed.  Half turns/quarter turns with SPC. Progress hip ABD strengthening. Gait on uneven surfaces, curbs. Static standing balance (EC, tandem stance).    PT Home Exercise Plan  [id number]    Consulted and Agree with Plan of Care  Patient       Patient will benefit from skilled therapeutic intervention in order to improve the following deficits and impairments:  Abnormal gait, Decreased balance, Decreased mobility, Decreased coordination, Decreased safety awareness, Decreased strength, Difficulty walking, Decreased  activity tolerance  Visit Diagnosis: 1. Hemiplegia and hemiparesis following cerebral infarction affecting right dominant side ([place])   2. Unsteadiness on feet   3. Other abnormalities of gait and mobility   4. Muscle weakness (generalized)        Problem List Patient Active Problem List   Diagnosis Date Noted  . Dermatitis of eyelid, contact or allergic 07/31/2018  . Right sided weakness 07/14/2018  . Left thalamic infarction ([place]) 07/12/2018  . Carotid artery disease ([place]) 07/09/2018  . Numbness and tingling 12/17/2017  . Right foot drop 12/17/2017  . Atherosclerosis of aorta ([place]) 10/09/2017  . Bariatric surgery status, 07/2017 08/19/2017  . Constipation 08/19/2017  . Hypertensive urgency 05/23/2017  . Greater trochanteric bursitis of both hips 12/19/2016  . OSA (obstructive sleep apnea) 07/26/2016  . Hypersomnia 06/11/2016  . Diabetes ([place]) 07/19/2015  . History of colonic polyps 12/01/2014  . Morbidly obese ([place]) 10/05/2013  . Partial nontraumatic tear of right rotator cuff 09/21/2013  . Goiter 12/10/2011  . Asthma 12/10/2011  . 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 History of TIA (transient ischemic attack) 02/13/2011  . PROTEINURIA, MILD 09/15/2009  . Hyperlipidemia 12/01/2006  . Essential hypertension 07/07/2006    Rico Junker, PT, DPT 11/28/18    9:58 AM    [facility] [address] [place], [place], [address] Phone: [phone]   Fax:  [phone]  Name: Rebekah Kennedy MRN: [id number] Date of Birth: 09/17/1955

## 2018-12-01 ENCOUNTER — Other Ambulatory Visit: Payer: Self-pay

## 2018-12-01 ENCOUNTER — Encounter: Payer: Self-pay | Admitting: Occupational Therapy

## 2018-12-01 ENCOUNTER — Ambulatory Visit: Payer: BC Managed Care – PPO | Admitting: Physical Therapy

## 2018-12-01 ENCOUNTER — Ambulatory Visit: Payer: BC Managed Care – PPO | Admitting: Occupational Therapy

## 2018-12-01 DIAGNOSIS — R278 Other lack of coordination: Secondary | ICD-10-CM

## 2018-12-01 DIAGNOSIS — I69351 Hemiplegia and hemiparesis following cerebral infarction affecting right dominant side: Secondary | ICD-10-CM

## 2018-12-01 DIAGNOSIS — R2681 Unsteadiness on feet: Secondary | ICD-10-CM | POA: Diagnosis not present

## 2018-12-01 DIAGNOSIS — M6281 Muscle weakness (generalized): Secondary | ICD-10-CM

## 2018-12-01 DIAGNOSIS — R2689 Other abnormalities of gait and mobility: Secondary | ICD-10-CM

## 2018-12-01 DIAGNOSIS — R293 Abnormal posture: Secondary | ICD-10-CM

## 2018-12-01 NOTE — Patient Instructions (Signed)
  Coordination Activities  Perform the following activities for 15-20 minutes 1-2  times per day with right hand(s).   Rotate ball in fingertips (clockwise and counter-clockwise).  Toss ball between hands.  Toss ball in air and catch with the same hand.  Flip cards 1 at a time as fast as you can. Time yourself the first time and then see if you can beat your own time each time you do it!  Deal cards with your thumb (Hold deck in hand and push card off top with thumb).  Pick up coins and place in container or coin bank one at time as fast as you can. Time yourself the first time you do it and then see if you can beat your own time each time!  Pick up coins and make stacks of 5. Once you have several stacks try and pick up each stack and place back in coin bank.   Pick up coins one at a time until you get 5-10 in your hand, then move coins from palm to fingertips to stack one at a time.  Practice writing your name. Place several pens and pads of paper around your house. Every time you walk past, stop and write and your name. GO SLOWLY and focus on keeping it legible.  Speed will come.   Screw together nuts and bolts, then unfasten. Go slow and be deliberate about it.  Make sure your right hand does the turning.

## 2018-12-01 NOTE — Therapy (Signed)
[facility] [address] [place], [place], [address] Phone: [phone]   Fax:  [phone]  Occupational Therapy Treatment  Patient Details  Name: Rebekah Kennedy MRN: [id number] Date of Birth: February 09, 1956 Referring Provider (OT): Dr. Billey Gosling   Encounter Date: 12/01/2018  OT End of Session - 12/01/18 1858    Visit Number  2    Number of Visits  16   pt may not need all 16 viists will be determined by progress toward goals   Date for OT Re-Evaluation  01/21/19    Authorization Type  BCBS [place]    OT Start Time  1801    OT Stop Time  1842    OT Time Calculation (min)  41 min    Activity Tolerance  Patient tolerated treatment well       Past Medical History:  Diagnosis Date  . ASTHMA NOS W/ACUTE EXACERBATION 07/10/2010  . Cervical dysplasia   . DEPRESSION 03/16/2007  . DIABETES MELLITUS, TYPE II 12/01/2006  . Dysmenorrhea   . Fibroid   . HYPERLIPIDEMIA 12/01/2006  . HYPERTENSION 07/07/2006  . Neuropathy   . Obesity   . Osteopenia 05/2017   T score -1.5 FRAX 2.6% / 0.1%  . Retinal edema    gets steriod inj in the eyes    . Sleep apnea    HAS CPAP BUT ADMITS DOES NOT USE JUDICIALLY   . Splenomegaly    in college  . Stroke [facility]) 2012   R pontine, mild residual left hemiparesis  . TIA (transient ischemic attack) 2012   2 TIAs 1 week apart of each other     Past Surgical History:  Procedure Laterality Date  . 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 APPENDECTOMY    . BIOPSY THYROID  05/02/11   Nonneoplastic goiter  . BREAST BIOPSY    . BREAST LUMPECTOMY WITH RADIOACTIVE SEED LOCALIZATION Left 07/25/2017   Procedure: LEFT BREAST LUMPECTOMY WITH RADIOACTIVE SEED LOCALIZATION;  Surgeon: Coralie Keens, MD;  Location: [facility];  Service: General;  Laterality: Left;  . BREAST SURGERY     Reduction  . COLPOSCOPY    . DILATION AND CURETTAGE OF UTERUS  1975   DUB  . EYE SURGERY     Laser  . GASTRIC ROUX-EN-Y N/A  08/12/2017   Procedure: LAPAROSCOPIC ROUX-EN-Y GASTRIC BYPASS WITH HIATAL HERNIA REPAIR AND UPPER ENDOSCOPY;  Surgeon: [name], [name], MD;  Location: [facility];  Service: General;  Laterality: N/A;  . GYNECOLOGIC CRYOSURGERY    . MYOMECTOMY    . OVARIAN CYST REMOVAL    . PELVIC LAPAROSCOPY  75,88   DL lysis of adhesions    There were no vitals filed for this visit.  Subjective Assessment - 12/01/18 1801    Subjective   These activities are challenging    Pertinent History  Pt with L thalamic stroke in Feb 2020 as well as R pontine stroke in 2012.  HTN, Diabetes, HLD, depression.    Patient Stated Goals  My R hand sometimes does its own thing and I want to be able to write better.    Currently in Pain?  No/denies                   OT Treatments/Exercises (OP) - 12/01/18 0001      ADLs   Writing  Addressed hand writing with pt - tried various pieces of AE and pt felt using coban on pen allowed for  the best control. Discussed why hand writing is difficult after a stroke.  Pt to practice writing her name at home by placing several pads and pens around her house and writing her name when she walks past in order to create short but frequent periods of familiar writing. Pt's R hand fatigues quickly with writing at this time. Pt verbalized understanding.     ADL Comments  Reviewed goals and POC with pt and pt in agreement.  Pt given written copy of OT goals.       Neurological Re-education Exercises   Other Exercises 1  Neuro re ed to address coordination and manipulation of R hand - pt issued HEP focused on this.  After instruction and practice pt able to return demonstrate all activities. Pt given written hand out.              OT Education - 12/01/18 1853    Education Details  HEP for coordination and writing    Person(s) Educated  Patient    Methods  Explanation;Demonstration;Handout    Comprehension  Verbalized understanding;Returned demonstration       OT  Short Term Goals - 12/01/18 1853      OT SHORT TERM GOAL #1   Title  Pt will be mod I for RUE coordination HEP - 12/24/2018    Status  On-going      OT SHORT TERM GOAL #2   Title  Pt will demonstrate improved coordination as evidenced by decreasing time on 9 hole peg by at least 4 seconds to assist with fine motor tasks    Baseline  31.93    Status  On-going      OT SHORT TERM GOAL #3   Title  Pt will report greater ease in using RUE to brush teeth    Status  On-going        OT Long Term Goals - 12/01/18 1853      OT LONG TERM GOAL #1   Title  Pt will be mod I with upgraded HEP for functional use of RUE/balance/activity tolerance - 01/21/2019    Status  On-going      OT LONG TERM GOAL #2   Title  Pt will demonstrate improved RUE coordination as evidenced by decreasing time on 9 hole peg test by at least 6 seconds to assist with fine motor tasks    Baseline  31.93    Status  On-going      OT LONG TERM GOAL #3   Title  Pt will demonstrate ability to use RUE to pick up, drink and set cup down without spilling using RUE.    Status  On-going      OT LONG TERM GOAL #4   Title  Pt will be able to write 3 sentence paragraph legibly, AE prn    Status  On-going      OT LONG TERM GOAL #5   Title  Pt will demonstrate ability to walk outside on uneven ground mod I for return to leisure and work related activities    Status  On-going      OT [place] #6   Title  Pt will demonstrate ability to complete simple shopping task mod I    Status  On-going            Plan - 12/01/18 1854    Clinical Impression Statement  Pt progressing toward goals.  Pt in agreement with POC and goals.    OT Occupational Profile and  History  Detailed Assessment- Review of Records and additional review of physical, cognitive, psychosocial history related to current functional performance    Occupational performance deficits (Please refer to evaluation for details):  ADL's;IADL's;Work;Leisure;Social  Participation    Body Structure / Function / Physical Skills  ADL;Balance;Coordination;FMC;GMC;IADL;Muscle spasms;Tone;UE functional use    Rehab Potential  Good    Clinical Decision Making  Several treatment options, min-mod task modification necessary    Comorbidities Affecting Occupational Performance:  May have comorbidities impacting occupational performance    Modification or Assistance to Complete Evaluation   Min-Moderate modification of tasks or assist with assess necessary to complete eval    OT Frequency  2x / week    OT Duration  8 weeks    OT Treatment/Interventions  Self-care/ADL training;Aquatic Therapy;DME and/or AE instruction;Neuromuscular education;Therapeutic exercise;Functional Mobility Training;Manual Therapy;Therapeutic activities;Patient/family education;Balance training    Plan  check HEP for coordination, fine motor activities, NMR for balance (incorporate activity tolerance) and educate pt on weight bearing techniques to assist with control of RUE, practice wrting for single letters and numbers,    Consulted and Agree with Plan of Care  Patient       Patient will benefit from skilled therapeutic intervention in order to improve the following deficits and impairments:   Body Structure / Function / Physical Skills: ADL, Balance, Coordination, FMC, GMC, IADL, Muscle spasms, Tone, UE functional use       Visit Diagnosis: 1. Hemiplegia and hemiparesis following cerebral infarction affecting right dominant side ([place])   2. Unsteadiness on feet   3. Other lack of coordination   4. Abnormal posture       Problem List Patient Active Problem List   Diagnosis Date Noted  . Dermatitis of eyelid, contact or allergic 07/31/2018  . Right sided weakness 07/14/2018  . Left thalamic infarction ([employer]) 07/12/2018  . Carotid artery disease ([place]) 07/09/2018  . Numbness and tingling 12/17/2017  . Right foot drop 12/17/2017  . Atherosclerosis of aorta ([place]) 10/09/2017  .  Bariatric surgery status, 07/2017 08/19/2017  . Constipation 08/19/2017  . Hypertensive urgency 05/23/2017  . Greater trochanteric bursitis of both hips 12/19/2016  . OSA (obstructive sleep apnea) 07/26/2016  . Hypersomnia 06/11/2016  . Diabetes ([place]) 07/19/2015  . History of colonic polyps 12/01/2014  . Morbidly obese ([place]) 10/05/2013  . Partial nontraumatic tear of right rotator cuff 09/21/2013  . Goiter 12/10/2011  . Asthma 12/10/2011  . 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 History of TIA (transient ischemic attack) 02/13/2011  . PROTEINURIA, MILD 09/15/2009  . Hyperlipidemia 12/01/2006  . Essential hypertension 07/07/2006    Quay Burow, OTR/L 12/01/2018, 7:01 PM  [facility] [address] [place], [place], [address] Phone: [phone]   Fax:  [phone]  Name: Rebekah Kennedy MRN: [id number] Date of Birth: Jun 21, 1955

## 2018-12-01 NOTE — Therapy (Signed)
[facility] [address] [place], [place], [address] Phone: [phone]   Fax:  [phone]  Physical Therapy Treatment  Patient Details  Name: Rebekah Kennedy MRN: [id number] Date of Birth: Nov 10, 1955 Referring Provider (PT): Billey Gosling, MD  CLINIC OPERATION CHANGES: [facility] is open at lower capacity following universal masking, social distancing, and patient screening.  The patient's COVID risk of complications score is 5.   Encounter Date: 12/01/2018  PT End of Session - 12/01/18 1016    Visit Number  5    Number of Visits  17    Date for PT Re-Evaluation  12/29/18    Authorization Type  BCBS    PT Start Time  0903    PT Stop Time  0949    PT Time Calculation (min)  46 min    Activity Tolerance  Patient tolerated treatment well    Behavior During Therapy  [facility] for tasks assessed/performed       Past Medical History:  Diagnosis Date  . ASTHMA NOS W/ACUTE EXACERBATION 07/10/2010  . Cervical dysplasia   . DEPRESSION 03/16/2007  . DIABETES MELLITUS, TYPE II 12/01/2006  . Dysmenorrhea   . Fibroid   . HYPERLIPIDEMIA 12/01/2006  . HYPERTENSION 07/07/2006  . Neuropathy   . Obesity   . Osteopenia 05/2017   T score -1.5 FRAX 2.6% / 0.1%  . Retinal edema    gets steriod inj in the eyes    . Sleep apnea    HAS CPAP BUT ADMITS DOES NOT USE JUDICIALLY   . Splenomegaly    in college  . Stroke [facility]) 2012   R pontine, mild residual left hemiparesis  . TIA (transient ischemic attack) 2012   2 TIAs 1 week apart of each other     Past Surgical History:  Procedure Laterality Date  . 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 APPENDECTOMY    . BIOPSY THYROID  05/02/11   Nonneoplastic goiter  . BREAST BIOPSY    . BREAST LUMPECTOMY WITH RADIOACTIVE SEED LOCALIZATION Left 07/25/2017   Procedure: LEFT BREAST LUMPECTOMY WITH RADIOACTIVE SEED LOCALIZATION;  Surgeon: Coralie Keens, MD;  Location: [facility];  Service:  General;  Laterality: Left;  . BREAST SURGERY     Reduction  . COLPOSCOPY    . DILATION AND CURETTAGE OF UTERUS  1975   DUB  . EYE SURGERY     Laser  . GASTRIC ROUX-EN-Y N/A 08/12/2017   Procedure: LAPAROSCOPIC ROUX-EN-Y GASTRIC BYPASS WITH HIATAL HERNIA REPAIR AND UPPER ENDOSCOPY;  Surgeon: [name], [name], MD;  Location[facility];  Service: General;  Laterality: N/A;  . GYNECOLOGIC CRYOSURGERY    . MYOMECTOMY    . OVARIAN CYST REMOVAL    . PELVIC LAPAROSCOPY  75,88   DL lysis of adhesions    There were no vitals filed for this visit.  Subjective Assessment - 12/01/18 0905    Subjective  Is doing good today. Still needs the information on resources for someone to install rails.    Pertinent History  L thalamic stroke (06/2018), R pontine stroke (2012), HTN, Uncontrolled diabetes    Limitations  Walking;Writing    Patient Stated Goals  Wants to improve her balance and get back to walking at the [facility] (when safely able to)                       [facility] Adult PT Treatment/Exercise - 12/01/18 1007  Ambulation/Gait   Ambulation/Gait  Yes    Ambulation/Gait Assistance  5: Supervision;6: Modified independent (Device/Increase time)    Ambulation/Gait Assistance Details  Patient ambulated with SPC with better carryover of gait mechanics today with holding SPC in LUE. Patient has tendency to lose the pattern when dual tasking while walking. Patient had a couple episodes of scissoring when increasing her cadence, cues to slow down, esp when performing turns.     Ambulation Distance (Feet)  230 Feet    Assistive device  Straight cane    Gait Pattern  Step-through pattern;Narrow base of support;Lateral hip instability;Decreased dorsiflexion - right    Ambulation Surface  Level;Indoor      Neuro Re-ed    Neuro Re-ed Details   Balance exercises in corner with pillow: FT EC x 30 seconds, modified tandem stance x3 reps B. In // bars: 1 x 10 reps B SLS with LE placed on 6"  step with no hand hold assist to improve SLS during stairs and hip stability, 2 x 10 reps B step ups onto 6" steps while standing on foam progressing from finger tip to no hand hold assist. 2 x 10 reps B step ups onto 8" step to mimic height of steps at home; using purple disc as target for R LE to decrease narrow BOS, focus on increased foot clearance with RLE, w/ support of 1 UE on bars       Added tandem stance to HEP at countertop. Discussion of increased safety while performing HEP countertop exercises by placing SPC on counter close by vs. Leaving it in adjacent room.   Access Code: [email or web address]  URL: [email or web address]  Date: 12/01/2018  Prepared by: Janann August   Exercises Sit to Stand - 10 reps - 1 sets - 2x daily - 7x weekly Clamshell - 10 reps - 3 sets - 2x daily - 7x weekly Side Stepping with Counter Support - 10 reps - 2 sets - 2x daily - 7x weekly Heel rises with counter support - 10 reps - 3 sets - 2x daily - 7x weekly Standing Tandem Balance with Counter Support - 5 reps - 2 sets - 1x daily - 7x weekly    PT Education - 12/01/18 1015    Education Details  Keeping cane with patient on countertop vs. adjacent room in house while doing HEP in order to increase safety    Person(s) Educated  Patient    Methods  Explanation    Comprehension  Verbalized understanding;Returned demonstration       PT Short Term Goals - 11/28/18 8502      PT SHORT TERM GOAL #1   Title  Patient will be independent with initial HEP to address balance and LE strength to address functional impairments.    Time  4    Period  Weeks    Status  Achieved    Target Date  11/29/18      PT SHORT TERM GOAL #2   Title  Patient will decrease 5 times sit <> stand score to 17 seconds from a low surface in order to increase functional LE strength for transfers.    Baseline  20.53 seconds > 21.69 seconds from chair without use of UE and without therapist assistance    Time  4    Period   Weeks    Status  Partially Met    Target Date  11/29/18        PT Long Term Goals - 11/28/18 0940  PT LONG TERM GOAL #1   Title  Patient will demonstrate proper and safe gait mechanics using SPC with no verbal cues from therapist in order to increase safety in the community.  (LTG due by 12/29/18)    Time  8    Period  Weeks    Target Date  12/29/18      PT LONG TERM GOAL #2   Title  Patient will increase BERG score to at least 52/56 in order to decrease risk of falls.    Baseline  39/56 > 47/56    Time  8    Period  Weeks    Status  Revised      PT LONG TERM GOAL #3   Title  Patient will ascend/descend 4 steps with LRAD using a step over step pattern and mod I in order for patient to safely enter and exit her home.    Baseline  4 with 2 rails with alternating sequence supervision    Time  8    Period  Weeks    Status  Revised      PT LONG TERM GOAL #4   Title  Patient will increase gait speed to at least 2.8 ft/sec with cane in order to help decrease her risk of falls and improve community mobility.    Baseline  1.08 ft/sec > 2.4 ft/sec with cane on L side    Time  8    Period  Weeks    Status  Revised      PT LONG TERM GOAL #5   Title  Patient will decrease 5 times sit <> stand time to at least 17 seconds from chair without UE support in order to improve functional LE strength for transfers.    Baseline  20.53 seconds    Time  8    Period  Weeks    Status  Revised            Plan - 12/01/18 1017    Clinical Impression Statement  Focus of today's session was balance on compliant surfaces and SLS to improve hip stability during gait and stairs. Patient has increased difficulty with SLS on the RLE, especially on compliant surfaces. Patient able to demonstrate increased foot clearance on 8" step with cueing. Will continue to progress towards LTGs.    Personal Factors and Comorbidities  Comorbidity 1;Profession;Past/Current Experience;Behavior Pattern;Comorbidity  2;Comorbidity 3+    Comorbidities  Hypertension, diabetes, obesity, asthma, hx of CVA, carotid artery disease    Examination-Activity Limitations  Locomotion Level;Squat;Stand    Examination-Participation Restrictions  Community Activity   Working as a Pharmacist, hospital, walking at the [employer]  Evolving/Moderate complexity    Rehab Potential  Good    PT Frequency  2x / week    PT Duration  8 weeks    PT Treatment/Interventions  Gait training;Stair training;Functional mobility training;Therapeutic activities;Therapeutic exercise;Balance training;Patient/family education;Neuromuscular re-education    PT Next Visit Plan  Give information about resources for installing rails for home entry. Add balance exercises to HEP. Gait with dual tasking. Balance with more narrow BOS/compliant surfaces/eyes closed.  Half turns/quarter turns with SPC. Progress hip ABD strengthening. Gait on uneven surfaces, curbs.    PT Home Exercise Plan  [email or web address]    Consulted and Agree with Plan of Care  Patient       Patient will benefit from skilled therapeutic intervention in order to improve the following deficits and impairments:  Abnormal gait, Decreased balance, Decreased mobility, Decreased  coordination, Decreased safety awareness, Decreased strength, Difficulty walking, Decreased activity tolerance  Visit Diagnosis: 1. Hemiplegia and hemiparesis following cerebral infarction affecting right dominant side ([place])   2. Unsteadiness on feet   3. Other abnormalities of gait and mobility   4. Muscle weakness (generalized)   5. Other lack of coordination        Problem List Patient Active Problem List   Diagnosis Date Noted  . Dermatitis of eyelid, contact or allergic 07/31/2018  . Right sided weakness 07/14/2018  . Left thalamic infarction ([place]) 07/12/2018  . Carotid artery disease ([place]) 07/09/2018  . Numbness and tingling 12/17/2017  . Right foot drop 12/17/2017  . Atherosclerosis of  aorta ([place]) 10/09/2017  . Bariatric surgery status, 07/2017 08/19/2017  . Constipation 08/19/2017  . Hypertensive urgency 05/23/2017  . Greater trochanteric bursitis of both hips 12/19/2016  . OSA (obstructive sleep apnea) 07/26/2016  . Hypersomnia 06/11/2016  . Diabetes ([place]) 07/19/2015  . History of colonic polyps 12/01/2014  . Morbidly obese ([place]) 10/05/2013  . Partial nontraumatic tear of right rotator cuff 09/21/2013  . Goiter 12/10/2011  . Asthma 12/10/2011  . 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 History of TIA (transient ischemic attack) 02/13/2011  . PROTEINURIA, MILD 09/15/2009  . Hyperlipidemia 12/01/2006  . Essential hypertension 07/07/2006    Arliss Journey, PT, DPT 12/01/2018, 10:23 AM  [facility] [address] [place], [place], [address] Phone: [phone]   Fax:  [phone]  Name: VELECIA OVITT MRN: [id number] Date of Birth: 1955-08-02

## 2018-12-05 ENCOUNTER — Telehealth: Payer: Self-pay | Admitting: Internal Medicine

## 2018-12-05 ENCOUNTER — Other Ambulatory Visit: Payer: Self-pay | Admitting: Endocrinology

## 2018-12-05 ENCOUNTER — Ambulatory Visit: Payer: BC Managed Care – PPO | Admitting: Physical Therapy

## 2018-12-05 MED ORDER — SITAGLIPTIN PHOSPHATE 100 MG PO TABS: 100.0000 mg | ORAL_TABLET | Freq: Every day | ORAL | 0 refills | Status: DC

## 2018-12-05 NOTE — Telephone Encounter (Signed)
erx sent

## 2018-12-05 NOTE — Telephone Encounter (Signed)
Please refill x 1 F/u is due  

## 2018-12-05 NOTE — Telephone Encounter (Signed)
Copied from [facility] [id number]. Topic: Quick Communication - Rx Refill/Question >> Dec 05, 2018 10:44 AM Yvette Rack wrote: Medication: JANUVIA 100 MG tablet  Has the patient contacted their pharmacy? yes   Preferred Pharmacy (with phone number or street name): [facility], [place] [address] [phone] (Phone)  [phone] (Fax)  Agent: Please be advised that RX refills may take up to 3 business days. We ask that you follow-up with your pharmacy.

## 2018-12-05 NOTE — Telephone Encounter (Signed)
LOV 11/15/17. Per your note, pt was advised to schedule f/u in 3 months. No future appt found. Please advise how you wish to proceed.

## 2018-12-08 ENCOUNTER — Other Ambulatory Visit: Payer: Self-pay

## 2018-12-08 ENCOUNTER — Ambulatory Visit: Payer: BC Managed Care – PPO | Admitting: Occupational Therapy

## 2018-12-08 ENCOUNTER — Encounter: Payer: Self-pay | Admitting: Occupational Therapy

## 2018-12-08 DIAGNOSIS — R2681 Unsteadiness on feet: Secondary | ICD-10-CM

## 2018-12-08 DIAGNOSIS — R293 Abnormal posture: Secondary | ICD-10-CM

## 2018-12-08 DIAGNOSIS — R278 Other lack of coordination: Secondary | ICD-10-CM

## 2018-12-08 DIAGNOSIS — I69351 Hemiplegia and hemiparesis following cerebral infarction affecting right dominant side: Secondary | ICD-10-CM

## 2018-12-08 NOTE — Telephone Encounter (Signed)
LOV 11/15/17. Per your note, follow-up appointment in 3 months. No future follow up appt noted. Please advise how you would like to proceed.

## 2018-12-08 NOTE — Telephone Encounter (Signed)
please contact patient: Please schedule f/u, to avoid d/c from practice.

## 2018-12-08 NOTE — Therapy (Signed)
[facility] [address] [place], [place], [address] Phone: [phone]   Fax:  [phone]  Occupational Therapy Treatment  Patient Details  Name: Rebekah Kennedy MRN: [id number] Date of Birth: 1955/08/02 Referring Provider (OT): Dr. Billey Gosling   Encounter Date: 12/08/2018  OT End of Session - 12/08/18 1739    Visit Number  3    Number of Visits  16    Date for OT Re-Evaluation  01/21/19    Authorization Type  BCBS [place]    OT Start Time  1702    OT Stop Time  1743    OT Time Calculation (min)  41 min    Activity Tolerance  Patient tolerated treatment well       Past Medical History:  Diagnosis Date  . ASTHMA NOS W/ACUTE EXACERBATION 07/10/2010  . Cervical dysplasia   . DEPRESSION 03/16/2007  . DIABETES MELLITUS, TYPE II 12/01/2006  . Dysmenorrhea   . Fibroid   . HYPERLIPIDEMIA 12/01/2006  . HYPERTENSION 07/07/2006  . Neuropathy   . Obesity   . Osteopenia 05/2017   T score -1.5 FRAX 2.6% / 0.1%  . Retinal edema    gets steriod inj in the eyes    . Sleep apnea    HAS CPAP BUT ADMITS DOES NOT USE JUDICIALLY   . Splenomegaly    in college  . Stroke [facility]) 2012   R pontine, mild residual left hemiparesis  . TIA (transient ischemic attack) 2012   2 TIAs 1 week apart of each other     Past Surgical History:  Procedure Laterality Date  . 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 APPENDECTOMY    . BIOPSY THYROID  05/02/11   Nonneoplastic goiter  . BREAST BIOPSY    . BREAST LUMPECTOMY WITH RADIOACTIVE SEED LOCALIZATION Left 07/25/2017   Procedure: LEFT BREAST LUMPECTOMY WITH RADIOACTIVE SEED LOCALIZATION;  Surgeon: Coralie Keens, MD;  Location: [facility];  Service: General;  Laterality: Left;  . BREAST SURGERY     Reduction  . COLPOSCOPY    . DILATION AND CURETTAGE OF UTERUS  1975   DUB  . EYE SURGERY     Laser  . GASTRIC ROUX-EN-Y N/A 08/12/2017   Procedure: LAPAROSCOPIC ROUX-EN-Y GASTRIC BYPASS WITH HIATAL  HERNIA REPAIR AND UPPER ENDOSCOPY;  Surgeon: [name], [name], MD;  Location: [facility];  Service: General;  Laterality: N/A;  . GYNECOLOGIC CRYOSURGERY    . MYOMECTOMY    . OVARIAN CYST REMOVAL    . PELVIC LAPAROSCOPY  75,88   DL lysis of adhesions    There were no vitals filed for this visit.  Subjective Assessment - 12/08/18 1704    Pertinent History  Pt with L thalamic stroke in Feb 2020 as well as R pontine stroke in 2012.  HTN, Diabetes, HLD, depression.    Patient Stated Goals  My R hand sometimes does its own thing and I want to be able to write better.    Currently in Pain?  No/denies                   OT Treatments/Exercises (OP) - 12/08/18 0001      ADLs   ADL Comments  Discussed inclusion of aquatic therapy and pt is in agreement. Reviewed information with pt and provided written information to pt. Pt verbalized agreement and will start aquatic therapy on August 17th.        Neurological Re-education Exercises   Other  Exercises 1  Neuro re ed to address in hand manipulation with R hand using grooved peg board activity. Pt with min difficulty. Focused on speed when removing pegs. Also addressed manipulation of very small items using [employer] Dexterity (without using tweezers).  Pt with minimal difficulty.  Utilized pegs in putty to also address pinch and manipulation skills.                 OT Short Term Goals - 12/08/18 1738      OT SHORT TERM GOAL #1   Title  Pt will be mod I for RUE coordination HEP - 12/24/2018    Status  On-going      OT SHORT TERM GOAL #2   Title  Pt will demonstrate improved coordination as evidenced by decreasing time on 9 hole peg by at least 4 seconds to assist with fine motor tasks    Baseline  31.93    Status  On-going      OT SHORT TERM GOAL #3   Title  Pt will report greater ease in using RUE to brush teeth    Status  On-going        OT Long Term Goals - 12/08/18 1738      OT LONG TERM GOAL #1    Title  Pt will be mod I with upgraded HEP for functional use of RUE/balance/activity tolerance - 01/21/2019    Status  On-going      OT LONG TERM GOAL #2   Title  Pt will demonstrate improved RUE coordination as evidenced by decreasing time on 9 hole peg test by at least 6 seconds to assist with fine motor tasks    Baseline  31.93    Status  On-going      OT LONG TERM GOAL #3   Title  Pt will demonstrate ability to use RUE to pick up, drink and set cup down without spilling using RUE.    Status  On-going      OT LONG TERM GOAL #4   Title  Pt will be able to write 3 sentence paragraph legibly, AE prn    Status  On-going      OT LONG TERM GOAL #5   Title  Pt will demonstrate ability to walk outside on uneven ground mod I for return to leisure and work related activities    Status  On-going      OT [place] #6   Title  Pt will demonstrate ability to complete simple shopping task mod I    Status  On-going            Plan - 12/08/18 1738    Clinical Impression Statement  Pt progressing toward goals. Pt is interested in starting aquatic therapy in August.    OT Occupational Profile and History  Detailed Assessment- Review of Records and additional review of physical, cognitive, psychosocial history related to current functional performance    Occupational performance deficits (Please refer to evaluation for details):  ADL's;IADL's;Work;Leisure;Social Participation    Body Structure / Function / Physical Skills  ADL;Balance;Coordination;FMC;GMC;IADL;Muscle spasms;Tone;UE functional use    Clinical Decision Making  Several treatment options, min-mod task modification necessary    Comorbidities Affecting Occupational Performance:  May have comorbidities impacting occupational performance    Modification or Assistance to Complete Evaluation   Min-Moderate modification of tasks or assist with assess necessary to complete eval    OT Frequency  2x / week    OT Duration  8 weeks    OT  Treatment/Interventions  Self-care/ADL training;Aquatic Therapy;DME and/or AE instruction;Neuromuscular education;Therapeutic exercise;Functional Mobility Training;Manual Therapy;Therapeutic activities;Patient/family education;Balance training    Plan  fine motor activities, bilateral functional hand activities, NMR for balance (incorporate activity tolerance) and educate pt on weight bearing techniques to assist with control of RUE, practice wrting for single letters and numbers,    Consulted and Agree with Plan of Care  Patient       Patient will benefit from skilled therapeutic intervention in order to improve the following deficits and impairments:   Body Structure / Function / Physical Skills: ADL, Balance, Coordination, FMC, GMC, IADL, Muscle spasms, Tone, UE functional use       Visit Diagnosis: 1. Hemiplegia and hemiparesis following cerebral infarction affecting right dominant side ([place])   2. Unsteadiness on feet   3. Other lack of coordination   4. Abnormal posture       Problem List Patient Active Problem List   Diagnosis Date Noted  . Dermatitis of eyelid, contact or allergic 07/31/2018  . Right sided weakness 07/14/2018  . Left thalamic infarction ([place]) 07/12/2018  . Carotid artery disease ([place]) 07/09/2018  . Numbness and tingling 12/17/2017  . Right foot drop 12/17/2017  . Atherosclerosis of aorta ([place]) 10/09/2017  . Bariatric surgery status, 07/2017 08/19/2017  . Constipation 08/19/2017  . Hypertensive urgency 05/23/2017  . Greater trochanteric bursitis of both hips 12/19/2016  . OSA (obstructive sleep apnea) 07/26/2016  . Hypersomnia 06/11/2016  . Diabetes ([place]) 07/19/2015  . History of colonic polyps 12/01/2014  . Morbidly obese ([place]) 10/05/2013  . Partial nontraumatic tear of right rotator cuff 09/21/2013  . Goiter 12/10/2011  . Asthma 12/10/2011  . 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 History of TIA (transient ischemic attack) 02/13/2011  .  PROTEINURIA, MILD 09/15/2009  . Hyperlipidemia 12/01/2006  . Essential hypertension 07/07/2006    Quay Burow, OTR/L 12/08/2018, 5:41 PM  [facility] [address] [place], [place], [address] Phone: [phone]   Fax:  [phone]  Name: Rebekah Kennedy MRN: [id number] Date of Birth: [birth year]

## 2018-12-11 ENCOUNTER — Other Ambulatory Visit: Payer: Self-pay

## 2018-12-11 ENCOUNTER — Telehealth: Payer: Self-pay | Admitting: Internal Medicine

## 2018-12-11 ENCOUNTER — Encounter: Payer: Self-pay | Admitting: Rehabilitation

## 2018-12-11 ENCOUNTER — Ambulatory Visit: Payer: BC Managed Care – PPO | Admitting: Rehabilitation

## 2018-12-11 ENCOUNTER — Ambulatory Visit: Payer: BC Managed Care – PPO | Admitting: Rehabilitative and Restorative Service Providers"

## 2018-12-11 DIAGNOSIS — R278 Other lack of coordination: Secondary | ICD-10-CM

## 2018-12-11 DIAGNOSIS — R2681 Unsteadiness on feet: Secondary | ICD-10-CM

## 2018-12-11 DIAGNOSIS — I69351 Hemiplegia and hemiparesis following cerebral infarction affecting right dominant side: Secondary | ICD-10-CM

## 2018-12-11 DIAGNOSIS — R293 Abnormal posture: Secondary | ICD-10-CM

## 2018-12-11 NOTE — Telephone Encounter (Signed)
Form has been completed & placed in providers box to review and sign.  °

## 2018-12-11 NOTE — Telephone Encounter (Signed)
Patient has dropped off [employer] critical claim form to be completed.  Have given to [place] for tracking.  Please call pt once ready for pickup.

## 2018-12-11 NOTE — Therapy (Signed)
[facility] [address] [place], [place], [address] Phone: [phone]   Fax:  [phone]  Physical Therapy Treatment  Patient Details  Name: Rebekah Kennedy MRN: [id number] Date of Birth: 20-Jun-1955 Referring Provider (PT): Billey Gosling, MD   Encounter Date: 12/11/2018  PT End of Session - 12/11/18 1547    Visit Number  6    Number of Visits  17    Date for PT Re-Evaluation  12/29/18    Authorization Type  BCBS    PT Start Time  3710    PT Stop Time  1446    PT Time Calculation (min)  44 min    Activity Tolerance  Patient tolerated treatment well    Behavior During Therapy  [facility] for tasks assessed/performed       Past Medical History:  Diagnosis Date  . ASTHMA NOS W/ACUTE EXACERBATION 07/10/2010  . Cervical dysplasia   . DEPRESSION 03/16/2007  . DIABETES MELLITUS, TYPE II 12/01/2006  . Dysmenorrhea   . Fibroid   . HYPERLIPIDEMIA 12/01/2006  . HYPERTENSION 07/07/2006  . Neuropathy   . Obesity   . Osteopenia 05/2017   T score -1.5 FRAX 2.6% / 0.1%  . Retinal edema    gets steriod inj in the eyes    . Sleep apnea    HAS CPAP BUT ADMITS DOES NOT USE JUDICIALLY   . Splenomegaly    in college  . Stroke [facility]) 2012   R pontine, mild residual left hemiparesis  . TIA (transient ischemic attack) 2012   2 TIAs 1 week apart of each other     Past Surgical History:  Procedure Laterality Date  . 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 APPENDECTOMY    . BIOPSY THYROID  05/02/11   Nonneoplastic goiter  . BREAST BIOPSY    . BREAST LUMPECTOMY WITH RADIOACTIVE SEED LOCALIZATION Left 07/25/2017   Procedure: LEFT BREAST LUMPECTOMY WITH RADIOACTIVE SEED LOCALIZATION;  Surgeon: Coralie Keens, MD;  Location: [facility];  Service: General;  Laterality: Left;  . BREAST SURGERY     Reduction  . COLPOSCOPY    . DILATION AND CURETTAGE OF UTERUS  1975   DUB  . EYE SURGERY     Laser  . GASTRIC ROUX-EN-Y N/A 08/12/2017   Procedure:  LAPAROSCOPIC ROUX-EN-Y GASTRIC BYPASS WITH HIATAL HERNIA REPAIR AND UPPER ENDOSCOPY;  Surgeon: [name], [name], MD;  [facility];  Service: General;  Laterality: N/A;  . GYNECOLOGIC CRYOSURGERY    . MYOMECTOMY    . OVARIAN CYST REMOVAL    . PELVIC LAPAROSCOPY  75,88   DL lysis of adhesions    There were no vitals filed for this visit.  Subjective Assessment - 12/11/18 1404    Subjective  Pt reports doing well today.  Reports numbness in feet that happens intermittently.    Pertinent History  L thalamic stroke (06/2018), R pontine stroke (2012), HTN, Uncontrolled diabetes    Limitations  Walking;Writing    Patient Stated Goals  Wants to improve her balance and get back to walking at the [facility] (when safely able to)    Currently in Pain?  No/denies                       [facility] Adult PT Treatment/Exercise - 12/11/18 1417      Ambulation/Gait   Ambulation/Gait  Yes    Ambulation/Gait Assistance  5: Supervision;4: Min guard    Ambulation/Gait Assistance Details  Note that even when ambulating back to treatment area from lobby, pt has difficulty with sequencing cane and even lost balance in a SLS, but was able to self recover.  While in treatment area, provided cues for increased stride length, improved heel to toe strike, improved R arm swing and sequencing with cane.  She still tends to pick up cane at times.  While ambulating worked on making quarter and half turns. Pt initially having difficulty crossing LEs over with LOB, however with increased practice and exercises, note improvement throughout session.  Also simulated more high level gait with stepping over obstacle, ambulating over compliant surface and stepping up/down 4" step with use of cane.  Pt able to do at min/guard to close S level.  min cues for safe RLE placement.     Ambulation Distance (Feet)  500 Feet   total, 200 then 300   Assistive device  Straight cane    Gait Pattern  Step-through pattern;Narrow  base of support;Lateral hip instability;Decreased dorsiflexion - right    Ambulation Surface  Level;Indoor    Pre-Gait Activities  Standing at counter top with chair behind her, had pt make quarter turns and half turns, working on leading with outer leg to prevent scissoring.  Pt did very well with increased practice, did each direction x 15 reps.        Neuro Re-ed    Neuro Re-ed Details   High level balance exercises to challenge ankle and hip strategy; rocker board biased forwards with feet apart EO maintaining balance x 2 sets of 20 secs, then alternating UE flexion x 10 reps progressed to board biased horizontally maintaining balance x 2 sets of 20 secs, alternating UE flex x 10 reps, and shifting weight side to side with little UE support x 10 reps (this was moderately difficult).  Progressed to standing on foam balance beam, again working on hip strategy; foam closer to wall, maintaining balance with feet apart EO x 20 secs progressing to wall bumps x 10 reps.  Then moved beam further from wall x 10 reps reps with cues for 5 sec hold as she tends to have posterior LOB at times.               PT Education - 12/11/18 1547    Education Details  hip strategy    Person(s) Educated  Patient    Methods  Explanation    Comprehension  Verbalized understanding       PT Short Term Goals - 11/28/18 0938      PT SHORT TERM GOAL #1   Title  Patient will be independent with initial HEP to address balance and LE strength to address functional impairments.    Time  4    Period  Weeks    Status  Achieved    Target Date  11/29/18      PT SHORT TERM GOAL #2   Title  Patient will decrease 5 times sit <> stand score to 17 seconds from a low surface in order to increase functional LE strength for transfers.    Baseline  20.53 seconds > 21.69 seconds from chair without use of UE and without therapist assistance    Time  4    Period  Weeks    Status  Partially Met    Target Date  11/29/18         PT Long Term Goals - 11/28/18 0940      PT LONG TERM GOAL #1  Title  Patient will demonstrate proper and safe gait mechanics using SPC with no verbal cues from therapist in order to increase safety in the community.  (LTG due by 12/29/18)    Time  8    Period  Weeks    Target Date  12/29/18      PT LONG TERM GOAL #2   Title  Patient will increase BERG score to at least 52/56 in order to decrease risk of falls.    Baseline  39/56 > 47/56    Time  8    Period  Weeks    Status  Revised      PT LONG TERM GOAL #3   Title  Patient will ascend/descend 4 steps with LRAD using a step over step pattern and mod I in order for patient to safely enter and exit her home.    Baseline  4 with 2 rails with alternating sequence supervision    Time  8    Period  Weeks    Status  Revised      PT LONG TERM GOAL #4   Title  Patient will increase gait speed to at least 2.8 ft/sec with cane in order to help decrease her risk of falls and improve community mobility.    Baseline  1.08 ft/sec > 2.4 ft/sec with cane on L side    Time  8    Period  Weeks    Status  Revised      PT LONG TERM GOAL #5   Title  Patient will decrease 5 times sit <> stand time to at least 17 seconds from chair without UE support in order to improve functional LE strength for transfers.    Baseline  20.53 seconds    Time  8    Period  Weeks    Status  Revised            Plan - 12/11/18 1547    Clinical Impression Statement  Skilled session continues to focus on high level balance and gait with ankle and hip strategy, compliant surfaces and high level gait/balance.  Pt continues to have mild unsteadiness with scissoring duirng session, but no overt LOB.    Personal Factors and Comorbidities  Comorbidity 1;Profession;Past/Current Experience;Behavior Pattern;Comorbidity 2;Comorbidity 3+    Comorbidities  Hypertension, diabetes, obesity, asthma, hx of CVA, carotid artery disease    Examination-Activity Limitations   Locomotion Level;Squat;Stand    Examination-Participation Restrictions  Community Activity   Working as a Pharmacist, hospital, walking at the [employer]  Evolving/Moderate complexity    Rehab Potential  Good    PT Frequency  2x / week    PT Duration  8 weeks    PT Treatment/Interventions  Gait training;Stair training;Functional mobility training;Therapeutic activities;Therapeutic exercise;Balance training;Patient/family education;Neuromuscular re-education    PT Next Visit Plan  Give information about resources for installing rails for home entry. Add balance exercises to HEP. Gait with dual tasking. Balance with more narrow BOS/compliant surfaces/eyes closed.  Half turns/quarter turns with SPC. Progress hip ABD strengthening. Gait on uneven surfaces, curbs.    PT Home Exercise Plan  [id number]    Consulted and Agree with Plan of Care  Patient       Patient will benefit from skilled therapeutic intervention in order to improve the following deficits and impairments:  Abnormal gait, Decreased balance, Decreased mobility, Decreased coordination, Decreased safety awareness, Decreased strength, Difficulty walking, Decreased activity tolerance  Visit Diagnosis: 1. Hemiplegia and hemiparesis following cerebral  infarction affecting right dominant side ([place])   2. Unsteadiness on feet   3. Abnormal posture   4. Other lack of coordination        Problem List Patient Active Problem List   Diagnosis Date Noted  . Dermatitis of eyelid, contact or allergic 07/31/2018  . Right sided weakness 07/14/2018  . Left thalamic infarction ([place]) 07/12/2018  . Carotid artery disease ([place]) 07/09/2018  . Numbness and tingling 12/17/2017  . Right foot drop 12/17/2017  . Atherosclerosis of aorta ([place]) 10/09/2017  . Bariatric surgery status, 07/2017 08/19/2017  . Constipation 08/19/2017  . Hypertensive urgency 05/23/2017  . Greater trochanteric bursitis of both hips 12/19/2016  . OSA  (obstructive sleep apnea) 07/26/2016  . Hypersomnia 06/11/2016  . Diabetes ([place]) 07/19/2015  . History of colonic polyps 12/01/2014  . Morbidly obese ([place]) 10/05/2013  . Partial nontraumatic tear of right rotator cuff 09/21/2013  . Goiter 12/10/2011  . Asthma 12/10/2011  . 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 History of TIA (transient ischemic attack) 02/13/2011  . PROTEINURIA, MILD 09/15/2009  . Hyperlipidemia 12/01/2006  . Essential hypertension 07/07/2006    Cameron Sprang, PT, MPT [facility] [address] [place], [place], [address] Phone: [phone]   Fax:  [phone] 12/11/18, 3:50 PM  Name: Rebekah Kennedy MRN: [id number] Date of Birth: March 09, 1956

## 2018-12-12 NOTE — Telephone Encounter (Signed)
Pt is set up for 7/28

## 2018-12-12 NOTE — Telephone Encounter (Signed)
Form has been signed, Faxed to [employer], Copy sent to scan.   Patient informed and original mailed to her.

## 2018-12-15 ENCOUNTER — Ambulatory Visit: Payer: BC Managed Care – PPO | Admitting: Occupational Therapy

## 2018-12-15 ENCOUNTER — Encounter: Payer: Self-pay | Admitting: Occupational Therapy

## 2018-12-15 ENCOUNTER — Other Ambulatory Visit: Payer: Self-pay

## 2018-12-15 ENCOUNTER — Encounter: Payer: Self-pay | Admitting: Internal Medicine

## 2018-12-15 DIAGNOSIS — R2681 Unsteadiness on feet: Secondary | ICD-10-CM | POA: Diagnosis not present

## 2018-12-15 DIAGNOSIS — R293 Abnormal posture: Secondary | ICD-10-CM

## 2018-12-15 DIAGNOSIS — I69351 Hemiplegia and hemiparesis following cerebral infarction affecting right dominant side: Secondary | ICD-10-CM

## 2018-12-15 DIAGNOSIS — R278 Other lack of coordination: Secondary | ICD-10-CM

## 2018-12-15 NOTE — Therapy (Signed)
[facility] [address] [place], [place], [address] Phone: [phone]   Fax:  [phone]  Occupational Therapy Treatment  Patient Details  Name: Rebekah Kennedy MRN: [id number] Date of Birth: Oct 09, 1955 Referring Provider (OT): Dr. Billey Gosling   Encounter Date: 12/15/2018  OT End of Session - 12/15/18 [place]    Visit Number  4    Number of Visits  16    Date for OT Re-Evaluation  02/04/19   date adjusted as pt will be on hold first two weeks in August   Authorization Type  [employer] [place]    OT Start Time  1802    OT Stop Time  1843    OT Time Calculation (min)  41 min    Activity Tolerance  Patient tolerated treatment well       Past Medical History:  Diagnosis Date  . ASTHMA NOS W/ACUTE EXACERBATION 07/10/2010  . Cervical dysplasia   . DEPRESSION 03/16/2007  . DIABETES MELLITUS, TYPE II 12/01/2006  . Dysmenorrhea   . Fibroid   . HYPERLIPIDEMIA 12/01/2006  . HYPERTENSION 07/07/2006  . Neuropathy   . Obesity   . Osteopenia 05/2017   T score -1.5 FRAX 2.6% / 0.1%  . Retinal edema    gets steriod inj in the eyes    . Sleep apnea    HAS CPAP BUT ADMITS DOES NOT USE JUDICIALLY   . Splenomegaly    in college  . Stroke [facility]) 2012   R pontine, mild residual left hemiparesis  . TIA (transient ischemic attack) 2012   2 TIAs 1 week apart of each other     Past Surgical History:  Procedure Laterality Date  . 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 APPENDECTOMY    . BIOPSY THYROID  05/02/11   Nonneoplastic goiter  . BREAST BIOPSY    . BREAST LUMPECTOMY WITH RADIOACTIVE SEED LOCALIZATION Left 07/25/2017   Procedure: LEFT BREAST LUMPECTOMY WITH RADIOACTIVE SEED LOCALIZATION;  Surgeon: Coralie Keens, MD;  Location: [facility];  Service: General;  Laterality: Left;  . BREAST SURGERY     Reduction  . COLPOSCOPY    . DILATION AND CURETTAGE OF UTERUS  1975   DUB  . EYE SURGERY     Laser  . GASTRIC ROUX-EN-Y N/A 08/12/2017    Procedure: LAPAROSCOPIC ROUX-EN-Y GASTRIC BYPASS WITH HIATAL HERNIA REPAIR AND UPPER ENDOSCOPY;  Surgeon: [name], [name], MD;  Location: [facility];  Service: General;  Laterality: N/A;  . GYNECOLOGIC CRYOSURGERY    . MYOMECTOMY    . OVARIAN CYST REMOVAL    . PELVIC LAPAROSCOPY  75,88   DL lysis of adhesions    There were no vitals filed for this visit.  Subjective Assessment - 12/15/18 1806    Subjective   I had an injection in my eye so it looks bad but it doesn't hurt.    Pertinent History  Pt with L thalamic stroke in Feb 2020 as well as R pontine stroke in 2012.  HTN, Diabetes, HLD, depression.    Patient Stated Goals  My R hand sometimes does its own thing and I want to be able to write better.    Currently in Pain?  No/denies                   OT Treatments/Exercises (OP) - 12/15/18 0001      ADLs   Writing  Addressed writing using coban wrap on pen to improve sensory  input into R hand and assist with grip control.  Focused on legibility and sustained activity ( 2 sentence length with rest breaks in between).  Pt with minimal difficulty however states " I have to really concentrate"   Pt rated her hand fatigue 4/10 after each 2 sentence trial. Hand writing is 100% legible however pt does at times display some mild discontrol and requires increased time for the activity.       Neurological Re-education Exercises   Other Exercises 1  Neuro re ed to address fine motor and in hand manipulation skills handling two small items - pt with moderate difficulty however performance improved with repetition.  Pt requried increasd time and enocuragement.  Pt needs vc's to avoid compensatory arm movements and instead focus on in hand manipulation skills.                OT Short Term Goals - 12/15/18 1838      OT SHORT TERM GOAL #1   Title  Pt will be mod I for RUE coordination HEP ( 01/07/19 goal date adjusted as pt will be on hold for first two weeks of August due to  scheduling)    Status  On-going      OT SHORT TERM GOAL #2   Title  Pt will demonstrate improved coordination as evidenced by decreasing time on 9 hole peg by at least 4 seconds to assist with fine motor tasks    Baseline  31.93    Status  On-going      OT SHORT TERM GOAL #3   Title  Pt will report greater ease in using RUE to brush teeth    Status  On-going        OT Long Term Goals - 12/15/18 1839      OT LONG TERM GOAL #1   Title  Pt will be mod I with upgraded HEP for functional use of RUE/balance/activity tolerance - 916/20 ( date adjusted as pt will be on hold first two weeks of August due to scheduling)    Status  On-going      OT LONG TERM GOAL #2   Title  Pt will demonstrate improved RUE coordination as evidenced by decreasing time on 9 hole peg test by at least 6 seconds to assist with fine motor tasks    Baseline  31.93    Status  On-going      OT LONG TERM GOAL #3   Title  Pt will demonstrate ability to use RUE to pick up, drink and set cup down without spilling using RUE.    Status  On-going      OT LONG TERM GOAL #4   Title  Pt will be able to write 3 sentence paragraph legibly, AE prn    Status  On-going      OT LONG TERM GOAL #5   Title  Pt will demonstrate ability to walk outside on uneven ground mod I for return to leisure and work related activities    Status  On-going      OT [place] #6   Title  Pt will demonstrate ability to complete simple shopping task mod I    Status  On-going            Plan - 12/15/18 1839    Clinical Impression Statement  Pt continues to progress toward goals. Pt with improvement in legibility in hand writing.    OT Occupational Profile and History  Detailed Assessment-  Review of Records and additional review of physical, cognitive, psychosocial history related to current functional performance    Occupational performance deficits (Please refer to evaluation for details):  ADL's;IADL's;Work;Leisure;Social  Participation    Body Structure / Function / Physical Skills  ADL;Balance;Coordination;FMC;GMC;IADL;Muscle spasms;Tone;UE functional use    Rehab Potential  Good    Clinical Decision Making  Several treatment options, min-mod task modification necessary    Comorbidities Affecting Occupational Performance:  May have comorbidities impacting occupational performance    Modification or Assistance to Complete Evaluation   Min-Moderate modification of tasks or assist with assess necessary to complete eval    OT Frequency  2x / week    OT Duration  8 weeks    OT Treatment/Interventions  Self-care/ADL training;Aquatic Therapy;DME and/or AE instruction;Neuromuscular education;Therapeutic exercise;Functional Mobility Training;Manual Therapy;Therapeutic activities;Patient/family education;Balance training    Plan  on hold first two weeks of August then resume fine motor activities, bilateral functional hand activities, NMR for balance (incorporate activity tolerance) and educate pt on weight bearing techniques to assist with control of RUE, practice wrting for single letters and numbers,    Consulted and Agree with Plan of Care  Patient       Patient will benefit from skilled therapeutic intervention in order to improve the following deficits and impairments:   Body Structure / Function / Physical Skills: ADL, Balance, Coordination, FMC, GMC, IADL, Muscle spasms, Tone, UE functional use       Visit Diagnosis: 1. Hemiplegia and hemiparesis following cerebral infarction affecting right dominant side ([place])   2. Unsteadiness on feet   3. Abnormal posture   4. Other lack of coordination       Problem List Patient Active Problem List   Diagnosis Date Noted  . Dermatitis of eyelid, contact or allergic 07/31/2018  . Right sided weakness 07/14/2018  . Left thalamic infarction ([place]) 07/12/2018  . Carotid artery disease ([place]) 07/09/2018  . Numbness and tingling 12/17/2017  . Right foot drop 12/17/2017   . Atherosclerosis of aorta ([place]) 10/09/2017  . Bariatric surgery status, 07/2017 08/19/2017  . Constipation 08/19/2017  . Hypertensive urgency 05/23/2017  . Greater trochanteric bursitis of both hips 12/19/2016  . OSA (obstructive sleep apnea) 07/26/2016  . Hypersomnia 06/11/2016  . Diabetes ([place]) 07/19/2015  . History of colonic polyps 12/01/2014  . Morbidly obese ([place]) 10/05/2013  . Partial nontraumatic tear of right rotator cuff 09/21/2013  . Goiter 12/10/2011  . Asthma 12/10/2011  . 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 History of TIA (transient ischemic attack) 02/13/2011  . PROTEINURIA, MILD 09/15/2009  . Hyperlipidemia 12/01/2006  . Essential hypertension 07/07/2006    Quay Burow, OTR/L 12/15/2018, 6:52 PM  [facility] [address] [place], [place], [address] Phone: [phone]   Fax:  [phone]  Name: Rebekah Kennedy MRN: [id number] Date of Birth: Feb 14, 1956

## 2018-12-16 ENCOUNTER — Encounter: Payer: Self-pay | Admitting: Endocrinology

## 2018-12-16 ENCOUNTER — Ambulatory Visit: Payer: BC Managed Care – PPO | Admitting: Endocrinology

## 2018-12-16 VITALS — BP 134/82 | HR 67 | Ht 66.0 in | Wt 170.0 lb

## 2018-12-16 DIAGNOSIS — Z9884 Bariatric surgery status: Secondary | ICD-10-CM

## 2018-12-16 DIAGNOSIS — E1151 Type 2 diabetes mellitus with diabetic peripheral angiopathy without gangrene: Secondary | ICD-10-CM

## 2018-12-16 DIAGNOSIS — Z794 Long term (current) use of insulin: Secondary | ICD-10-CM

## 2018-12-16 LAB — VITAMIN D 25 HYDROXY (VIT D DEFICIENCY, FRACTURES): VITD: 24.1 ng/mL — ABNORMAL LOW (ref 30.00–100.00)

## 2018-12-16 LAB — IBC PANEL
Iron: 74 ug/dL (ref 42–145)
Saturation Ratios: 23.6 % (ref 20.0–50.0)
Transferrin: 224 mg/dL (ref 212.0–360.0)

## 2018-12-16 LAB — VITAMIN B12: Vitamin B-12: 588 pg/mL (ref 211–911)

## 2018-12-16 NOTE — Patient Instructions (Addendum)
Please continue the same medications.  check your blood sugar twice a day.  vary the time of day when you check, between before the 3 meals, and at bedtime.  also check if you have symptoms of your blood sugar being too high or too low.  please keep a record of the readings and bring it to your next appointment here (or you can bring the meter itself).  You can write it on any piece of paper.  please call us sooner if your blood sugar goes below 70, or if you have a lot of readings over 200. blood tests are requested for you today.  We'll let you know about the results. I would be happy to see you back here as needed.

## 2018-12-16 NOTE — Progress Notes (Signed)
Subjective:    Patient ID: Rebekah Kennedy, female    DOB: 02/25/1956, 62 y.o.   MRN: [id number]  HPI Pt returns for f/u of diabetes mellitus:  DM type: Insulin-requiring type 2.  Dx'ed: 2004.  Complications: CVA, nephropathy, polyneuropathy, and PDR.  Therapy: 2 oral meds  GDM: never.  DKA: never Severe hypoglycemia: once, in 2018.  Pancreatitis: never.  Other: she took insulin 2013-2019, when she had gastric bypass surgery; she did not tolerate metformin (diarrhea).   Interval history:  She has lost 80 lbs so far.  pt states she feels well in general.  She takes meds as rx'ed.   She takes only bariatric multivitamin.   Past Medical History:  Diagnosis Date  . ASTHMA NOS W/ACUTE EXACERBATION 07/10/2010  . Cervical dysplasia   . DEPRESSION 03/16/2007  . DIABETES MELLITUS, TYPE II 12/01/2006  . Dysmenorrhea   . Fibroid   . HYPERLIPIDEMIA 12/01/2006  . HYPERTENSION 07/07/2006  . Neuropathy   . Obesity   . Osteopenia 05/2017   T score -1.5 FRAX 2.6% / 0.1%  . Retinal edema    gets steriod inj in the eyes    . Sleep apnea    HAS CPAP BUT ADMITS DOES NOT USE JUDICIALLY   . Splenomegaly    in college  . Stroke [facility]) 2012   R pontine, mild residual left hemiparesis  . TIA (transient ischemic attack) 2012   2 TIAs 1 week apart of each other     Past Surgical History:  Procedure Laterality Date  . 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 APPENDECTOMY    . BIOPSY THYROID  05/02/11   Nonneoplastic goiter  . BREAST BIOPSY    . BREAST LUMPECTOMY WITH RADIOACTIVE SEED LOCALIZATION Left 07/25/2017   Procedure: LEFT BREAST LUMPECTOMY WITH RADIOACTIVE SEED LOCALIZATION;  Surgeon: Coralie Keens, MD;  Location: [facility];  Service: General;  Laterality: Left;  . BREAST SURGERY     Reduction  . COLPOSCOPY    . DILATION AND CURETTAGE OF UTERUS  1975   DUB  . EYE SURGERY     Laser  . GASTRIC ROUX-EN-Y N/A 08/12/2017   Procedure: LAPAROSCOPIC ROUX-EN-Y GASTRIC BYPASS WITH HIATAL HERNIA  REPAIR AND UPPER ENDOSCOPY;  Surgeon: [name], [name], MD;  Location[facility];  Service: General;  Laterality: N/A;  . GYNECOLOGIC CRYOSURGERY    . MYOMECTOMY    . OVARIAN CYST REMOVAL    . PELVIC LAPAROSCOPY  75,88   DL lysis of adhesions    Social History   Socioeconomic History  . Marital status: Single    Spouse name: Not on file  . Number of children: 2  . Years of education: 16  . Highest education level: Bachelor's degree (e.g., BA, AB, BS)  Occupational History  . Occupation: Product manager: [employer]  . Financial resource strain: Not on file  . Food insecurity    Worry: Not on file    Inability: Not on file  . Transportation needs    Medical: Not on file    Non-medical: Not on file  Tobacco Use  . Smoking status: Never Smoker  . Smokeless tobacco: Never Used  Substance and Sexual Activity  . Alcohol use: Yes    Alcohol/week: 0.0 standard drinks    Comment: very rare  . Drug use: No  . Sexual activity: Not Currently    Birth control/protection: Post-menopausal    Comment: 1st intercourse 63 yo-Fewer  than 5 partners  Lifestyle  . Physical activity    Days per week: Not on file    Minutes per session: Not on file  . Stress: Not on file  Relationships  . Social Herbalist on phone: Not on file    Gets together: Not on file    Attends religious service: Not on file    Active member of club or organization: Not on file    Attends meetings of clubs or organizations: Not on file    Relationship status: Not on file  . Intimate partner violence    Fear of current or ex partner: Not on file    Emotionally abused: Not on file    Physically abused: Not on file    Forced sexual activity: Not on file  Other Topics Concern  . 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      Living with daughter      Right handed      4 steps inside of the  home       Current Outpatient Medications on File Prior to Visit  Medication Sig Dispense Refill  . albuterol (PROVENTIL HFA;VENTOLIN HFA) 108 (90 Base) MCG/ACT inhaler Inhale 2 puffs into the lungs every 4 (four) hours as needed for wheezing or shortness of breath. 18 g 2  . amLODipine (NORVASC) 2.5 MG tablet Take 1 tablet (2.5 mg total) by mouth daily. 30 tablet 5  . aspirin EC 81 MG EC tablet Take 1 tablet (81 mg total) by mouth daily. 30 tablet 0  . Biotin 5000 MCG TABS Take 5,000 mcg by mouth daily.     . clopidogrel (PLAVIX) 75 MG tablet TAKE 1 TABLET BY MOUTH EVERY DAY 90 tablet 1  . dapagliflozin propanediol (FARXIGA) 5 MG TABS tablet Take 5 mg by mouth daily. 30 tablet 5  . losartan (COZAAR) 100 MG tablet TAKE 1 TAB BY MOUTH ONCE DAILY. 90 tablet 1  . metoprolol succinate (TOPROL-XL) 100 MG 24 hr tablet TAKE 1 TABLET BY MOUTH DAILY WITH OR IMMEDIATELY FOLLOWING A MEAL 90 tablet 1  . montelukast (SINGULAIR) 10 MG tablet Take 1 tablet (10 mg total) by mouth daily. 90 tablet 0  . OVER THE COUNTER MEDICATION Take 1 tablet by mouth daily. BariMelt    . rosuvastatin (CRESTOR) 10 MG tablet TAKE 1 TABLET BY MOUTH EVERY DAY 90 tablet 3  . sitaGLIPtin (JANUVIA) 100 MG tablet Take 1 tablet (100 mg total) by mouth daily. 30 tablet 0  . tacrolimus (PROTOPIC) 0.1 % ointment APPLY TO AFFECTED AREA TWICE A DAY (Patient taking differently: daily as needed. ) 30 g 0   No current facility-administered medications on file prior to visit.     Allergies  Allergen Reactions  . Quinapril Cough    Family History  Problem Relation Age of Onset  . Cancer Maternal Grandmother        Colon Cancer  . Asthma Maternal Grandmother   . Diabetes Father   . Heart disease Father   . Hypertension Father   . Hyperlipidemia Father   . Hypertension Mother   . Heart disease Mother   . Ovarian cancer Mother   . Cancer Mother        Lung cancer  . Asthma Mother   . COPD Mother   . Hyperlipidemia Mother   .  Diabetes Brother   .  Hypertension Brother   . Kidney disease Brother   . Asthma Brother   . Heart disease Brother   . Hyperlipidemia Brother   . Cancer Brother        Prostate  . Graves' disease Sister   . Diabetes Sister   . Breast cancer Sister 68  . Graves' disease Paternal Grandmother   . Hypertension Paternal Grandmother   . Heart disease Paternal Grandmother   . Alzheimer's disease Paternal Grandmother   . Cancer Maternal Grandfather   . Heart failure Brother   . Heart failure Brother     BP 134/82 (BP Location: Left Arm, Patient Position: Sitting, Cuff Size: Large)   Pulse 67   Ht 5\' 6"  (1.676 m)   Wt 170 lb (77.1 kg)   SpO2 93%   BMI 27.44 kg/m   Review of Systems Denies recent n/v    Objective:   Physical Exam VITAL SIGNS:  See vs page GENERAL: no distress Pulses: dorsalis pedis intact bilat.   MSK: no deformity of the feet CV: no leg edema Skin:  no ulcer on the feet.  normal color and temp on the feet. Neuro: sensation is intact to touch on the feet  Lab Results  Component Value Date   CREATININE 0.72 10/23/2018   BUN 14 10/23/2018   NA 142 10/23/2018   K 3.9 10/23/2018   CL 105 10/23/2018   CO2 27 10/23/2018     Lab Results  Component Value Date   HGBA1C 6.7 (H) 10/23/2018   25-OH vit-D: 24    Assessment & Plan:  Type 2 DM, with PDR: well-controlled Bariatric surgery status: good progress.  I encouraged pt to lose more.  Vit-D def: she needs rx.  take non-prescription, 2000 units per day.   Patient Instructions  Please continue the same medications.  check your blood sugar twice a day.  vary the time of day when you check, between before the 3 meals, and at bedtime.  also check if you have symptoms of your blood sugar being too high or too low.  please keep a record of the readings and bring it to your next appointment here (or you can bring the meter itself).  You can write it on any piece of paper.  please call us sooner if your blood  sugar goes below 70, or if you have a lot of readings over 200. blood tests are requested for you today.  We'll let you know about the results. I would be happy to see you back here as needed.

## 2018-12-17 ENCOUNTER — Other Ambulatory Visit: Payer: Self-pay

## 2018-12-17 ENCOUNTER — Ambulatory Visit: Payer: BC Managed Care – PPO | Admitting: Physical Therapy

## 2018-12-17 ENCOUNTER — Encounter: Payer: Self-pay | Admitting: Physical Therapy

## 2018-12-17 DIAGNOSIS — R2689 Other abnormalities of gait and mobility: Secondary | ICD-10-CM

## 2018-12-17 DIAGNOSIS — R2681 Unsteadiness on feet: Secondary | ICD-10-CM | POA: Diagnosis not present

## 2018-12-17 DIAGNOSIS — I69351 Hemiplegia and hemiparesis following cerebral infarction affecting right dominant side: Secondary | ICD-10-CM

## 2018-12-17 DIAGNOSIS — M6281 Muscle weakness (generalized): Secondary | ICD-10-CM

## 2018-12-17 DIAGNOSIS — R293 Abnormal posture: Secondary | ICD-10-CM

## 2018-12-17 DIAGNOSIS — R278 Other lack of coordination: Secondary | ICD-10-CM

## 2018-12-17 NOTE — Therapy (Signed)
[facility] [address] [place], [place], [address] Phone: [phone]   Fax:  [phone]  Physical Therapy Treatment  Patient Details  Name: Rebekah Kennedy MRN: [id number] Date of Birth: 1955-07-30 Referring Provider (PT): Billey Gosling, MD  CLINIC OPERATION CHANGES: Outpatient Neuro Rehab is open at lower capacity following universal masking, social distancing, and patient screening.  The patient's COVID risk of complications score is 5.   Encounter Date: 12/17/2018  PT End of Session - 12/17/18 0958    Visit Number  7    Number of Visits  17    Date for PT Re-Evaluation  12/29/18    Authorization Type  BCBS    PT Start Time  0909    PT Stop Time  0950    PT Time Calculation (min)  41 min    Equipment Utilized During Treatment  Gait belt    Activity Tolerance  Patient tolerated treatment well    Behavior During Therapy  WFL for tasks assessed/performed       Past Medical History:  Diagnosis Date  . ASTHMA NOS W/ACUTE EXACERBATION 07/10/2010  . Cervical dysplasia   . DEPRESSION 03/16/2007  . DIABETES MELLITUS, TYPE II 12/01/2006  . Dysmenorrhea   . Fibroid   . HYPERLIPIDEMIA 12/01/2006  . HYPERTENSION 07/07/2006  . Neuropathy   . Obesity   . Osteopenia 05/2017   T score -1.5 FRAX 2.6% / 0.1%  . Retinal edema    gets steriod inj in the eyes    . Sleep apnea    HAS CPAP BUT ADMITS DOES NOT USE JUDICIALLY   . Splenomegaly    in college  . Stroke [facility]) 2012   R pontine, mild residual left hemiparesis  . TIA (transient ischemic attack) 2012   2 TIAs 1 week apart of each other     Past Surgical History:  Procedure Laterality Date  . 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 APPENDECTOMY    . BIOPSY THYROID  05/02/11   Nonneoplastic goiter  . BREAST BIOPSY    . BREAST LUMPECTOMY WITH RADIOACTIVE SEED LOCALIZATION Left 07/25/2017   Procedure: LEFT BREAST LUMPECTOMY WITH RADIOACTIVE SEED LOCALIZATION;  Surgeon:  Coralie Keens, MD;  Location: [facility];  Service: General;  Laterality: Left;  . BREAST SURGERY     Reduction  . COLPOSCOPY    . DILATION AND CURETTAGE OF UTERUS  1975   DUB  . EYE SURGERY     Laser  . GASTRIC ROUX-EN-Y N/A 08/12/2017   Procedure: LAPAROSCOPIC ROUX-EN-Y GASTRIC BYPASS WITH HIATAL HERNIA REPAIR AND UPPER ENDOSCOPY;  Surgeon: [name], [name], MD;  Location[facility];  Service: General;  Laterality: N/A;  . GYNECOLOGIC CRYOSURGERY    . MYOMECTOMY    . OVARIAN CYST REMOVAL    . PELVIC LAPAROSCOPY  75,88   DL lysis of adhesions    There were no vitals filed for this visit.  Subjective Assessment - 12/17/18 0918    Subjective  No falls. Has noticed some days her walking is better than normal.    Pertinent History  L thalamic stroke (06/2018), R pontine stroke (2012), HTN, Uncontrolled diabetes    Limitations  Walking;Writing    Patient Stated Goals  Wants to improve her balance and get back to walking at the [facility] (when safely able to)                       [facility] Adult PT  Treatment/Exercise - 12/17/18 1228      Ambulation/Gait   Ambulation/Gait  Yes    Ambulation/Gait Assistance  5: Supervision;4: Min guard    Ambulation/Gait Assistance Details  Initial cueing for step through pattern and reciprocal arm swing. Pt able to swing RUE with gait, however loses pattern when dual tasking with gait. Pt with reports of her R knee feels like it is going to buckle, but no overt LOB. While practicing turns with [facility], cues for pt to take steps with outside LE in order to prevent scissoring.    Ambulation Distance (Feet)  230 Feet    Assistive device  Straight cane    Gait Pattern  Step-through pattern;Narrow base of support;Lateral hip instability;Decreased dorsiflexion - right    Ambulation Surface  Level;Indoor      High Level Balance   High Level Balance Comments  In // bars with blue foam: 4 x 30 seconds EC mild LOB w/ pt able to catch herself on bars, 3 x 30  seconds FT EO, 1 x 10 reps feet apart EO with vertical head nods/ horizontal head turns; 1 x 10 reps FT EO w/ vertical head nods/ horizontal head turns (incr postural sway with horizontal head turns). 2 x 10 reps B side stepping off then back onto blue foam; cueing for big steps, progressing to one hand fingertip assist.       Self-Care   Self-Care  Other Self-Care Comments    Other Self-Care Comments   Provided patient with number and contact information from [place] to have handrails installed outside her house      Therapeutic Activites    Therapeutic Activities  --      Neuro Re-ed    Neuro Re-ed Details   Mini squats with 4 second hold, focus on eccentric lowering at mat table, 2 x 10 reps, w/ chair in front of pt with blue tape in center of top of chair for visual cueing for anterior weight shift (nose forward over toes) during sit to stand.              PT Education - 12/17/18 0958    Education Details  gait training with reciprocal arm swing, turns    Person(s) Educated  Patient    Methods  Explanation;Demonstration    Comprehension  Verbalized understanding;Returned demonstration;Need further instruction       PT Short Term Goals - 11/28/18 7793      PT SHORT TERM GOAL #1   Title  Patient will be independent with initial HEP to address balance and LE strength to address functional impairments.    Time  4    Period  Weeks    Status  Achieved    Target Date  11/29/18      PT SHORT TERM GOAL #2   Title  Patient will decrease 5 times sit <> stand score to 17 seconds from a low surface in order to increase functional LE strength for transfers.    Baseline  20.53 seconds > 21.69 seconds from chair without use of UE and without therapist assistance    Time  4    Period  Weeks    Status  Partially Met    Target Date  11/29/18        PT Long Term Goals - 11/28/18 0940      PT LONG TERM GOAL #1   Title  Patient will demonstrate proper and safe gait  mechanics using SPC with no verbal  cues from therapist in order to increase safety in the community.  (LTG due by 12/29/18)    Time  8    Period  Weeks    Target Date  12/29/18      PT LONG TERM GOAL #2   Title  Patient will increase BERG score to at least 52/56 in order to decrease risk of falls.    Baseline  39/56 > 47/56    Time  8    Period  Weeks    Status  Revised      PT LONG TERM GOAL #3   Title  Patient will ascend/descend 4 steps with LRAD using a step over step pattern and mod I in order for patient to safely enter and exit her home.    Baseline  4 with 2 rails with alternating sequence supervision    Time  8    Period  Weeks    Status  Revised      PT LONG TERM GOAL #4   Title  Patient will increase gait speed to at least 2.8 ft/sec with cane in order to help decrease her risk of falls and improve community mobility.    Baseline  1.08 ft/sec > 2.4 ft/sec with cane on L side    Time  8    Period  Weeks    Status  Revised      PT LONG TERM GOAL #5   Title  Patient will decrease 5 times sit <> stand time to at least 17 seconds from chair without UE support in order to improve functional LE strength for transfers.    Baseline  20.53 seconds    Time  8    Period  Weeks    Status  Revised            Plan - 12/17/18 1221    Clinical Impression Statement  Skilled session focused on ankle strategy on compliant surfaces, gait training with turns, and LE strengthening. While talking while ambulating, pt has tendency to lift cane off of ground and not follow with step through gait pattern with SPC. Pt had a couple brief episodes today where she felt like her R knee was going to buckle while she was ambulating but no LOB. Will continue to progress towards LTGs.    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 Gait with dual tasking/on uneven surfaces. Balance with more narrow BOS/compliant surfaces/eyes closed.  Half turns/quarter turns with SPC.    PT Home Exercise Plan  [email or web address]    Consulted and Agree with Plan of Care  Patient       Patient will benefit from skilled therapeutic intervention in order to improve the following deficits and impairments:  Abnormal gait, Decreased balance, Decreased mobility, Decreased coordination, Decreased safety awareness, Decreased strength, Difficulty walking, Decreased activity tolerance  Visit Diagnosis: 1. Hemiplegia and hemiparesis following  cerebral infarction affecting right dominant side ([place])   2. Unsteadiness on feet   3. Abnormal posture   4. Other lack of coordination   5. Other abnormalities of gait and mobility   6. Muscle weakness (generalized)        Problem List Patient Active Problem List   Diagnosis Date Noted  . Dermatitis of eyelid, contact or allergic 07/31/2018  . Right sided weakness 07/14/2018  . Left thalamic infarction ([place]) 07/12/2018  . Carotid artery disease ([place]) 07/09/2018  . Numbness and tingling 12/17/2017  . Right foot drop 12/17/2017  . Atherosclerosis of aorta ([place]) 10/09/2017  . Bariatric surgery status, 07/2017 08/19/2017  . Constipation 08/19/2017  . Hypertensive urgency 05/23/2017  . Greater trochanteric bursitis of both hips  12/19/2016  . OSA (obstructive sleep apnea) 07/26/2016  . Hypersomnia 06/11/2016  . Diabetes ([place]) 07/19/2015  . History of colonic polyps 12/01/2014  . Morbidly obese ([place]) 10/05/2013  . Partial nontraumatic tear of right rotator cuff 09/21/2013  . Goiter 12/10/2011  . Asthma 12/10/2011  . 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 History of TIA (transient ischemic attack) 02/13/2011  . PROTEINURIA, MILD 09/15/2009  . Hyperlipidemia 12/01/2006  . Essential hypertension 07/07/2006    Arliss Journey, PT, DPT 12/17/2018, 12:32 PM  [facility] [address] [place], [place], [address] Phone: [phone]   Fax:  [phone]  Name: Rebekah Kennedy MRN: [id number] Date of Birth: [birth year]

## 2018-12-19 ENCOUNTER — Ambulatory Visit: Payer: BC Managed Care – PPO | Admitting: Physical Therapy

## 2018-12-22 ENCOUNTER — Ambulatory Visit: Payer: BC Managed Care – PPO | Admitting: Occupational Therapy

## 2018-12-22 ENCOUNTER — Ambulatory Visit: Payer: BC Managed Care – PPO | Attending: Internal Medicine | Admitting: Physical Therapy

## 2018-12-22 ENCOUNTER — Ambulatory Visit: Payer: BC Managed Care – PPO | Admitting: Physical Therapy

## 2018-12-22 ENCOUNTER — Other Ambulatory Visit: Payer: Self-pay

## 2018-12-22 DIAGNOSIS — R2681 Unsteadiness on feet: Secondary | ICD-10-CM | POA: Diagnosis present

## 2018-12-22 DIAGNOSIS — I69351 Hemiplegia and hemiparesis following cerebral infarction affecting right dominant side: Secondary | ICD-10-CM | POA: Diagnosis not present

## 2018-12-22 DIAGNOSIS — R278 Other lack of coordination: Secondary | ICD-10-CM | POA: Diagnosis present

## 2018-12-22 DIAGNOSIS — M6281 Muscle weakness (generalized): Secondary | ICD-10-CM

## 2018-12-22 DIAGNOSIS — R2689 Other abnormalities of gait and mobility: Secondary | ICD-10-CM

## 2018-12-22 DIAGNOSIS — R293 Abnormal posture: Secondary | ICD-10-CM

## 2018-12-22 NOTE — Patient Instructions (Signed)
Access Code: [email or web address]  URL: [email or web address]  Date: 12/22/2018  Prepared by: Janann August   Exercises Sit to Stand - 10 reps - 1 sets - 2x daily - 7x weekly Clamshell - 10 reps - 3 sets - 2x daily - 7x weekly Side Stepping with Counter Support - 10 reps - 2 sets - 2x daily - 7x weekly Heel rises with counter support - 10 reps - 3 sets - 2x daily - 7x weekly Standing Tandem Balance with Counter Support - 5 reps - 2 sets - 1x daily - 7x weekly Bent Knee Fallouts - 10 reps - 2 sets - 1x daily - 7x weekly Supine Bridge with Resistance Band - 10 reps - 2 sets - 1x daily - 7x weekly

## 2018-12-22 NOTE — Therapy (Signed)
[facility] [address] [place], [place], [address] Phone: [phone]   Fax:  [phone]  Physical Therapy Treatment  Patient Details  Name: Rebekah Kennedy MRN: [id number] Date of Birth: [birth year] Referring Provider (PT): Billey Gosling, MD  CLINIC OPERATION CHANGES: Outpatient Neuro Rehab is open at lower capacity following universal masking, social distancing, and patient screening.  The patient's COVID risk of complications score is 5.    Encounter Date: 12/22/2018  PT End of Session - 12/22/18 1146    Visit Number  8    Number of Visits  17    Date for PT Re-Evaluation  12/29/18    Authorization Type  BCBS    PT Start Time  0933    PT Stop Time  1014    PT Time Calculation (min)  41 min    Equipment Utilized During Treatment  Gait belt    Activity Tolerance  Patient tolerated treatment well    Behavior During Therapy  WFL for tasks assessed/performed       Past Medical History:  Diagnosis Date  . ASTHMA NOS W/ACUTE EXACERBATION 07/10/2010  . Cervical dysplasia   . DEPRESSION 03/16/2007  . DIABETES MELLITUS, TYPE II 12/01/2006  . Dysmenorrhea   . Fibroid   . HYPERLIPIDEMIA 12/01/2006  . HYPERTENSION 07/07/2006  . Neuropathy   . Obesity   . Osteopenia 05/2017   T score -1.5 FRAX 2.6% / 0.1%  . Retinal edema    gets steriod inj in the eyes    . Sleep apnea    HAS CPAP BUT ADMITS DOES NOT USE JUDICIALLY   . Splenomegaly    in college  . Stroke [facility]) 2012   R pontine, mild residual left hemiparesis  . TIA (transient ischemic attack) 2012   2 TIAs 1 week apart of each other     Past Surgical History:  Procedure Laterality Date  . 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 APPENDECTOMY    . BIOPSY THYROID  05/02/11   Nonneoplastic goiter  . BREAST BIOPSY    . BREAST LUMPECTOMY WITH RADIOACTIVE SEED LOCALIZATION Left 07/25/2017   Procedure: LEFT BREAST LUMPECTOMY WITH RADIOACTIVE SEED LOCALIZATION;  Surgeon:  Coralie Keens, MD;  Location: [facility];  Service: General;  Laterality: Left;  . BREAST SURGERY     Reduction  . COLPOSCOPY    . DILATION AND CURETTAGE OF UTERUS  1975   DUB  . EYE SURGERY     Laser  . GASTRIC ROUX-EN-Y N/A 08/12/2017   Procedure: LAPAROSCOPIC ROUX-EN-Y GASTRIC BYPASS WITH HIATAL HERNIA REPAIR AND UPPER ENDOSCOPY;  Surgeon: [name], [name], MD;  [facility];  Service: General;  Laterality: N/A;  . GYNECOLOGIC CRYOSURGERY    . MYOMECTOMY    . OVARIAN CYST REMOVAL    . PELVIC LAPAROSCOPY  75,88   DL lysis of adhesions    There were no vitals filed for this visit.  Subjective Assessment - 12/22/18 0935    Subjective  Nothing new. Notices walking is still better some days than others.    Pertinent History  L thalamic stroke (06/2018), R pontine stroke (2012), HTN, Uncontrolled diabetes    Limitations  Walking;Writing    Patient Stated Goals  Wants to improve her balance and get back to walking at the [facility] (when safely able to)          Access Code: [email or web address]  URL: [email or web address]  Date: 12/22/2018  Prepared by: Janann August  Exercises Sit to Stand - 10 reps - 1 sets - 2x daily - 7x weekly Clamshell - 10 reps - 3 sets - 2x daily - 7x weekly Side Stepping with Counter Support - 10 reps - 2 sets - 2x daily - 7x weekly Heel rises with counter support - 10 reps - 3 sets - 2x daily - 7x weekly Standing Tandem Balance with Counter Support - 5 reps - 2 sets - 1x daily - 7x weekly  New additions/progressions to HEP with focus on increasing BLE (hip ABD) strength for improved gait mechanics.  Bent Knee Fallouts - 10 reps - 2 sets - 1x daily - 7x weekly  -using red theraband, keeping one leg still, push out into band with contralateral leg (while keeping feet planted on mat table), hold for 5 second, cues for eccentric control --> increased difficulty performing on R Supine Bridge with Resistance Band - 10 reps - 2 sets - 1x daily - 7x  weekly  -using red theraband, gently push into band with RLE (for hip ABD) before lifting into bridge, hold at top for 3-5 seconds, focus on slow eccentric lowering             [facility] Adult PT Treatment/Exercise - 12/22/18 1140      Ambulation/Gait   Ambulation/Gait  Yes    Ambulation/Gait Assistance  5: Supervision    Ambulation/Gait Assistance Details  Patient ambulated into clinic with large cross body bag over her shoulder. Discussed with pt how to use it across body (with bag towards R side away from cane) in order to increase safety while ambulating, and discussed using a smaller bag. While walking outside, cues for increased step length for ambulating up inclines and while performing turns by stepping with outside leg in order to decrease scissoring. Pt was able to ambulate 115' around therapy gym while holding a conversation with therapist and not lift SPC off the ground.    Ambulation Distance (Feet)  230 Feet   indoors, 200 ft outdoors   Assistive device  Straight cane    Gait Pattern  Step-through pattern;Narrow base of support;Lateral hip instability;Decreased dorsiflexion - right    Ambulation Surface  Level;Unlevel;Indoor;Outdoor    Curb  5: Supervision    Curb Details (indicate cue type and reason)  x5 reps, with verbal cueing on correct technique. Pt able to perform last 2 reps without any verbal cueing. Pt able to repeat correct technique using teachback method at the end of therapy session             PT Education - 12/22/18 1145    Education Details  new additions to HEP to focus on strengthening hip ABD    Person(s) Educated  Patient    Methods  Explanation;Handout    Comprehension  Verbalized understanding;Returned demonstration       PT Short Term Goals - 11/28/18 0938      PT SHORT TERM GOAL #1   Title  Patient will be independent with initial HEP to address balance and LE strength to address functional impairments.    Time  4    Period  Weeks     Status  Achieved    Target Date  11/29/18      PT SHORT TERM GOAL #2   Title  Patient will decrease 5 times sit <> stand score to 17 seconds from a low surface in order to increase functional LE strength for transfers.    Baseline  20.53 seconds > 21.69  seconds from chair without use of UE and without therapist assistance    Time  4    Period  Weeks    Status  Partially Met    Target Date  11/29/18        PT Long Term Goals - 11/28/18 0940      PT LONG TERM GOAL #1   Title  Patient will demonstrate proper and safe gait mechanics using SPC with no verbal cues from therapist in order to increase safety in the community.  (LTG due by 12/29/18)    Time  8    Period  Weeks    Target Date  12/29/18      PT LONG TERM GOAL #2   Title  Patient will increase BERG score to at least 52/56 in order to decrease risk of falls.    Baseline  39/56 > 47/56    Time  8    Period  Weeks    Status  Revised      PT LONG TERM GOAL #3   Title  Patient will ascend/descend 4 steps with LRAD using a step over step pattern and mod I in order for patient to safely enter and exit her home.    Baseline  4 with 2 rails with alternating sequence supervision    Time  8    Period  Weeks    Status  Revised      PT LONG TERM GOAL #4   Title  Patient will increase gait speed to at least 2.8 ft/sec with cane in order to help decrease her risk of falls and improve community mobility.    Baseline  1.08 ft/sec > 2.4 ft/sec with cane on L side    Time  8    Period  Weeks    Status  Revised      PT LONG TERM GOAL #5   Title  Patient will decrease 5 times sit <> stand time to at least 17 seconds from chair without UE support in order to improve functional LE strength for transfers.    Baseline  20.53 seconds    Time  8    Period  Weeks    Status  Revised            Plan - 12/22/18 1147    Clinical Impression Statement  Skilled session focused on upgrading pt's HEP to focus on BLE strengthening for gait  mechanics (notably hip ABD). Pt with increased difficulty with eccentric control of R hip ABD against gravity. Pt demonstrated improved gait mechanics today with SPC while dual tasking (communicating with therapist) over level surfaces indoors. Will continue to progress towards LTGs.    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 Gait with dual tasking/on uneven surfaces. Balance with more narrow BOS/compliant surfaces/eyes closed. Hip and ankle strategy.  Half turns/quarter turns with SPC.    PT Home Exercise  Plan  [id number]    Consulted and Agree with Plan of Care  Patient       Patient will benefit from skilled therapeutic intervention in order to improve the following deficits and impairments:  Abnormal gait, Decreased balance, Decreased mobility, Decreased coordination, Decreased safety awareness, Decreased strength, Difficulty walking, Decreased activity tolerance  Visit Diagnosis: 1. Hemiplegia and hemiparesis following cerebral infarction affecting right dominant side ([place])   2. Unsteadiness on feet   3. Abnormal posture   4. Other lack of coordination   5. Other abnormalities of gait and mobility   6. Muscle weakness (generalized)        Problem List Patient  Active Problem List   Diagnosis Date Noted  . Dermatitis of eyelid, contact or allergic 07/31/2018  . Right sided weakness 07/14/2018  . Left thalamic infarction ([place]) 07/12/2018  . Carotid artery disease ([place]) 07/09/2018  . Numbness and tingling 12/17/2017  . Right foot drop 12/17/2017  . Atherosclerosis of aorta ([place]) 10/09/2017  . Bariatric surgery status, 07/2017 08/19/2017  . Constipation 08/19/2017  . Hypertensive urgency 05/23/2017  . Greater trochanteric bursitis of both hips 12/19/2016  . OSA (obstructive sleep apnea) 07/26/2016  . Hypersomnia 06/11/2016  . Diabetes ([place]) 07/19/2015  . History of colonic polyps 12/01/2014  . Morbidly obese ([place]) 10/05/2013  . Partial nontraumatic tear of right rotator cuff 09/21/2013  . Goiter 12/10/2011  . Asthma 12/10/2011  . 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 History of TIA (transient ischemic attack) 02/13/2011  . PROTEINURIA, MILD 09/15/2009  . Hyperlipidemia 12/01/2006  . Essential hypertension 07/07/2006    Arliss Journey, PT, DPT 12/22/2018, 11:51 AM  [facility] [address] [place], [place], [address] Phone: [phone]   Fax:  [phone]  Name: Rebekah Kennedy MRN: [id number] Date of Birth: [birth year]

## 2018-12-23 ENCOUNTER — Other Ambulatory Visit: Payer: Self-pay | Admitting: Gastroenterology

## 2018-12-23 ENCOUNTER — Telehealth: Payer: Self-pay

## 2018-12-23 ENCOUNTER — Ambulatory Visit (INDEPENDENT_AMBULATORY_CARE_PROVIDER_SITE_OTHER): Payer: BC Managed Care – PPO | Admitting: Gastroenterology

## 2018-12-23 ENCOUNTER — Encounter: Payer: Self-pay | Admitting: Gastroenterology

## 2018-12-23 VITALS — Ht 66.0 in | Wt 166.2 lb

## 2018-12-23 DIAGNOSIS — Z1211 Encounter for screening for malignant neoplasm of colon: Secondary | ICD-10-CM | POA: Diagnosis not present

## 2018-12-23 LAB — PTH, INTACT AND CALCIUM
Calcium: 9.7 mg/dL (ref 8.6–10.4)
PTH: 19 pg/mL (ref 14–64)

## 2018-12-23 LAB — VITAMIN A: Vitamin A (Retinoic Acid): 56 ug/dL (ref 38–98)

## 2018-12-23 MED ORDER — PEG 3350-KCL-NA BICARB-NACL 420 G PO SOLR: 4000.0000 mL | ORAL | 0 refills | Status: DC

## 2018-12-23 NOTE — Telephone Encounter (Signed)
Spoke to pharmacy they will run a generic version

## 2018-12-23 NOTE — Patient Instructions (Addendum)
We will arrange for a colonoscopy for routine risk colon cancer screening in about 4 or 5 weeks from now.  That will give [place] time to reach out to her neurologist to make sure that she thinks it is safe for the patient to hold Plavix for 5 days prior to the colonoscopy.  It would be completely safe for her to stay on aspirin for the colonoscopy.  Thank you for entrusting me with your care and choosing [facility].  Dr Ardis Hughs

## 2018-12-23 NOTE — Telephone Encounter (Signed)
[facility] Pre-operative Risk Assessment     Request for surgical clearance:     Endoscopy Procedure  What type of surgery is being performed?     Colonoscopy  When is this surgery scheduled?     01/23/19  What type of clearance is required ?   Pharmacy  Are there any medications that need to be held prior to surgery and how long? Plavix 5 days  Practice name and name of physician performing surgery?      [facility]  What is your office phone and fax number?      Phone- [phone]  [phone]  Anesthesia type (None, local, MAC, general) ?       MAC

## 2018-12-23 NOTE — Telephone Encounter (Signed)
I can not sign off

## 2018-12-23 NOTE — Progress Notes (Signed)
Review of pertinent gastrointestinal problems: 1.  Routine risk for colon cancer.  Colonoscopy September 2009 I found multiple small hyperplastic rectosigmoid polyps and recommended repeat colonoscopy at 10-year interval for routine risk colon cancer screening.     This service was provided via virtual visit.  We tried audio and visual connection however she had difficulty loading the app for A/V and so we went with audio only the patient was located at home.  I was located in my office.  The patient did consent to this virtual visit and is aware of possible charges through their insurance for this visit.  I last saw her 11 years ago and so she is considered a new patient.  My certified medical assistant, Grace Bushy, contributed to this visit by contacting the patient by phone 1 or 2 business days prior to the appointment and also followed up on the recommendations I made after the visit.  Time spent on virtual visit: 21 minutes   HPI: This is a very pleasant 63 year old woman whom I last saw about 11 years ago.  That was at the time of a colon cancer screening colonoscopy.  See those results above.  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 She has lost 81 pounds.  Feels so much.  No longer requires insulin.  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  Because of the thalamic infarct which was while she was on Plavix, aspirin was added to her regimen.    She really has no GI symptoms.  Specifically no constipation, no diarrhea, no bleeding, no severe abdominal pains.   Chief complaint is routine risk for colon cancer  ROS: complete GI ROS as described in HPI, all other review negative.  Constitutional:  No unintentional weight loss   Past Medical History:  Diagnosis Date  . ASTHMA NOS W/ACUTE EXACERBATION 07/10/2010  . Cervical  dysplasia   . DEPRESSION 03/16/2007  . DIABETES MELLITUS, TYPE II 12/01/2006  . Dysmenorrhea   . Fibroid   . HYPERLIPIDEMIA 12/01/2006  . HYPERTENSION 07/07/2006  . Neuropathy   . Obesity   . Osteopenia 05/2017   T score -1.5 FRAX 2.6% / 0.1%  . Retinal edema    gets steriod inj in the eyes    . Sleep apnea    HAS CPAP BUT ADMITS DOES NOT USE JUDICIALLY   . Splenomegaly    in college  . Stroke [facility]) 2012   R pontine, mild residual left hemiparesis  . TIA (transient ischemic attack) 2012   2 TIAs 1 week apart of each other     Past Surgical History:  Procedure Laterality Date  . 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 APPENDECTOMY    . BIOPSY THYROID  05/02/11   Nonneoplastic goiter  . BREAST BIOPSY    . BREAST LUMPECTOMY WITH RADIOACTIVE SEED LOCALIZATION Left 07/25/2017   Procedure: LEFT BREAST LUMPECTOMY WITH RADIOACTIVE SEED LOCALIZATION;  Surgeon: Coralie Keens, MD;  Location: [facility];  Service: General;  Laterality: Left;  . BREAST SURGERY     Reduction  . COLPOSCOPY    . DILATION AND CURETTAGE OF UTERUS  1975   DUB  . EYE SURGERY     Laser  . GASTRIC ROUX-EN-Y N/A 08/12/2017   Procedure: LAPAROSCOPIC ROUX-EN-Y GASTRIC BYPASS WITH HIATAL HERNIA REPAIR AND UPPER ENDOSCOPY;  Surgeon: [name], [name], MD;  Location: [facility];  Service: General;  Laterality: N/A;  . GYNECOLOGIC CRYOSURGERY    . MYOMECTOMY    . OVARIAN CYST REMOVAL    . PELVIC LAPAROSCOPY  75,88   DL lysis of adhesions    Current Outpatient Medications  Medication Sig Dispense Refill  . albuterol (PROVENTIL HFA;VENTOLIN HFA) 108 (90 Base) MCG/ACT inhaler Inhale 2 puffs into the lungs every 4 (four) hours as needed for wheezing or shortness of breath. 18 g 2  . amLODipine (NORVASC) 2.5 MG tablet Take 1 tablet (2.5 mg total) by mouth daily. 30 tablet 5  . aspirin EC 81 MG EC tablet Take 1 tablet (81 mg total) by mouth daily. 30 tablet 0  . Biotin 5000 MCG TABS Take 5,000 mcg by mouth daily.     .  clopidogrel (PLAVIX) 75 MG tablet TAKE 1 TABLET BY MOUTH EVERY DAY 90 tablet 1  . dapagliflozin propanediol (FARXIGA) 5 MG TABS tablet Take 5 mg by mouth daily. 30 tablet 5  . losartan (COZAAR) 100 MG tablet TAKE 1 TAB BY MOUTH ONCE DAILY. 90 tablet 1  . metoprolol succinate (TOPROL-XL) 100 MG 24 hr tablet TAKE 1 TABLET BY MOUTH DAILY WITH OR IMMEDIATELY FOLLOWING A MEAL 90 tablet 1  . montelukast (SINGULAIR) 10 MG tablet Take 1 tablet (10 mg total) by mouth daily. 90 tablet 0  . OVER THE COUNTER MEDICATION Take 1 tablet by mouth daily. BariMelt    . rosuvastatin (CRESTOR) 10 MG tablet TAKE 1 TABLET BY MOUTH EVERY DAY 90 tablet 3  . sitaGLIPtin (JANUVIA) 100 MG tablet Take 1 tablet (100 mg total) by mouth daily. 30 tablet 0  . tacrolimus (PROTOPIC) 0.1 % ointment APPLY TO AFFECTED AREA TWICE A DAY (Patient taking differently: daily as needed. ) 30 g 0   No current facility-administered medications for this visit.     Allergies as of 12/23/2018 - Review Complete 12/23/2018  Allergen Reaction Noted  . Quinapril Cough 12/10/2011    Family History  Problem Relation Age of Onset  . Cancer Maternal Grandmother        Colon Cancer  . Asthma Maternal Grandmother   . Diabetes Father   . Heart disease Father   . Hypertension Father   . Hyperlipidemia Father   . Hypertension Mother   . Heart disease Mother   . Ovarian cancer Mother   . Cancer Mother        Lung cancer  . Asthma Mother   . COPD Mother   . Hyperlipidemia Mother   . Diabetes Brother   . Hypertension Brother   . Kidney disease Brother   . Asthma Brother   . Heart disease Brother   . Hyperlipidemia Brother   . Cancer Brother        Prostate  . Graves' disease Sister   . Diabetes Sister   . Breast cancer Sister 34  . Graves' disease Paternal Grandmother   . Hypertension Paternal Grandmother   . Heart disease Paternal Grandmother   . Alzheimer's disease Paternal Grandmother   . Cancer Maternal Grandfather   . Heart  failure Brother   . Heart failure Brother     Social History   Socioeconomic History  . Marital status: Single    Spouse name: Not on file  . Number of children: 2  . Years of education: 16  . Highest education level: Bachelor's degree (e.g., BA, AB, BS)  Occupational History  . Occupation: Product manager: [employer]  Social Needs  . Financial resource strain: Not on file  . Food insecurity    Worry: Not on file    Inability: Not on file  . Transportation needs    Medical: Not on file    Non-medical: Not on file  Tobacco Use  . Smoking status: Never Smoker  . Smokeless tobacco: Never Used  Substance and Sexual Activity  . Alcohol use: Yes    Alcohol/week: 0.0 standard drinks    Comment: very rare  . Drug use: No  . Sexual activity: Not Currently    Birth control/protection: Post-menopausal    Comment: 1st intercourse 63 yo-Fewer than 5 partners  Lifestyle  . Physical activity    Days per week: Not on file    Minutes per session: Not on file  . Stress: Not on file  Relationships  . Social Herbalist on phone: Not on file    Gets together: Not on file    Attends religious service: Not on file    Active member of club or organization: Not on file    Attends meetings of clubs or organizations: Not on file    Relationship status: Not on file  . Intimate partner violence    Fear of current or ex partner: Not on file    Emotionally abused: Not on file    Physically abused: Not on file    Forced sexual activity: Not on file  Other Topics Concern  . 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      Living with daughter      Right handed      4 steps inside of the home        Physical Exam: Unable to perform because this was a "telemed visit" due to current Covid-19 pandemic  Assessment and plan: 63 y.o. female with routine risk for colon cancer, increased  risk for procedure related complication given ongoing chronic blood thinner use  I recommended we proceed with colonoscopy at her soonest convenience for routine risk colon cancer screening.  She understands that Plavix puts her at increased risk for bleeding complications and so we asked her to stop the Plavix for 5 days prior to the colonoscopy.  We will reach out to her neurologist to make sure that she feels that recommendation is safe.  I explained to the patient very clearly that she should not stop her aspirin regardless of the decision about her Plavix.  I see no reason for any further blood tests or imaging studies.  Please see the "Patient Instructions" section for addition details about the plan.  Owens Loffler, MD [facility] 12/23/2018, 3:04 PM

## 2018-12-25 NOTE — Telephone Encounter (Signed)
OK to hold plavix for procedure.  

## 2018-12-25 NOTE — Telephone Encounter (Signed)
Patient notified she can hold Plavix 5 days prior to her Colonoscopy. Patient verbalized understanding.

## 2018-12-26 ENCOUNTER — Ambulatory Visit: Payer: BC Managed Care – PPO | Admitting: Physical Therapy

## 2018-12-26 ENCOUNTER — Encounter: Payer: BC Managed Care – PPO | Admitting: Occupational Therapy

## 2018-12-30 ENCOUNTER — Ambulatory Visit: Payer: BC Managed Care – PPO | Admitting: Physical Therapy

## 2018-12-30 ENCOUNTER — Encounter: Payer: BC Managed Care – PPO | Admitting: Occupational Therapy

## 2018-12-30 ENCOUNTER — Encounter: Payer: Self-pay | Admitting: Physical Therapy

## 2018-12-30 ENCOUNTER — Other Ambulatory Visit: Payer: Self-pay

## 2018-12-30 DIAGNOSIS — M6281 Muscle weakness (generalized): Secondary | ICD-10-CM

## 2018-12-30 DIAGNOSIS — I69351 Hemiplegia and hemiparesis following cerebral infarction affecting right dominant side: Secondary | ICD-10-CM | POA: Diagnosis not present

## 2018-12-30 DIAGNOSIS — R2681 Unsteadiness on feet: Secondary | ICD-10-CM

## 2018-12-30 DIAGNOSIS — R293 Abnormal posture: Secondary | ICD-10-CM

## 2018-12-30 DIAGNOSIS — R278 Other lack of coordination: Secondary | ICD-10-CM

## 2018-12-30 DIAGNOSIS — R2689 Other abnormalities of gait and mobility: Secondary | ICD-10-CM

## 2018-12-30 NOTE — Therapy (Signed)
[facility] [address] [place], [place], [address] Phone: [phone]   Fax:  [phone]  Physical Therapy Treatment / Re-Cert  Patient Details  Name: Rebekah Kennedy MRN: [id number] Date of Birth: [birth year] Referring Provider (PT): Billey Gosling, MD   Encounter Date: 12/30/2018  PT End of Session - 12/30/18 1811    Visit Number  9    Number of Visits  17    Date for PT Re-Evaluation  01/30/19    Authorization Type  BCBS    PT Start Time  1620    PT Stop Time  1700    PT Time Calculation (min)  40 min    Equipment Utilized During Treatment  Gait belt    Activity Tolerance  Patient tolerated treatment well    Behavior During Therapy  [facility] for tasks assessed/performed       Past Medical History:  Diagnosis Date  . ASTHMA NOS W/ACUTE EXACERBATION 07/10/2010  . Cervical dysplasia   . DEPRESSION 03/16/2007  . DIABETES MELLITUS, TYPE II 12/01/2006  . Dysmenorrhea   . Fibroid   . HYPERLIPIDEMIA 12/01/2006  . HYPERTENSION 07/07/2006  . Neuropathy   . Obesity   . Osteopenia 05/2017   T score -1.5 FRAX 2.6% / 0.1%  . Retinal edema    gets steriod inj in the eyes    . Sleep apnea    HAS CPAP BUT ADMITS DOES NOT USE JUDICIALLY   . Splenomegaly    in college  . Stroke [facility]) 2012   R pontine, mild residual left hemiparesis  . TIA (transient ischemic attack) 2012   2 TIAs 1 week apart of each other     Past Surgical History:  Procedure Laterality Date  . 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 APPENDECTOMY    . BIOPSY THYROID  05/02/11   Nonneoplastic goiter  . BREAST BIOPSY    . BREAST LUMPECTOMY WITH RADIOACTIVE SEED LOCALIZATION Left 07/25/2017   Procedure: LEFT BREAST LUMPECTOMY WITH RADIOACTIVE SEED LOCALIZATION;  Surgeon: Coralie Keens, MD;  Location: [facility];  Service: General;  Laterality: Left;  . BREAST SURGERY     Reduction  . COLPOSCOPY    . DILATION AND CURETTAGE OF UTERUS  1975   DUB  . EYE SURGERY      Laser  . GASTRIC ROUX-EN-Y N/A 08/12/2017   Procedure: LAPAROSCOPIC ROUX-EN-Y GASTRIC BYPASS WITH HIATAL HERNIA REPAIR AND UPPER ENDOSCOPY;  Surgeon: [name], [name], MD;  [facility];  Service: General;  Laterality: N/A;  . GYNECOLOGIC CRYOSURGERY    . MYOMECTOMY    . OVARIAN CYST REMOVAL    . PELVIC LAPAROSCOPY  75,88   DL lysis of adhesions    There were no vitals filed for this visit.  Subjective Assessment - 12/30/18 1624    Subjective  No falls. No new complaints.    Pertinent History  L thalamic stroke (06/2018), R pontine stroke (2012), HTN, Uncontrolled diabetes    Limitations  Walking;Writing    Patient Stated Goals  Wants to improve her balance and get back to walking at the [facility] (when safely able to)         [facility] PT Assessment - 12/30/18 1638      Ambulation/Gait   Ambulation/Gait  Yes    Ambulation/Gait Assistance  5: Supervision    Ambulation/Gait Assistance Details  Patient needing initial cueing for correct gait mechanics with SPC. Pt consistently able to keep SPC on ground during gait  around therapy gym. Cueing for R arm swing.     Ambulation Distance (Feet)  300 Feet    Assistive device  Straight cane    Gait Pattern  Step-through pattern;Narrow base of support;Lateral hip instability;Decreased dorsiflexion - right    Ambulation Surface  Level;Indoor    Pre-Gait Activities  Standing at counter top with cane practiced making quarter turns with cane to R and L, cues for technique and to decrease scissoring by leading with either outer leg on R or cane on L. Initial frequent verbal cueing, however with massed practice pt did well and required supervision with only minimal intermittent cueing       Standardized Balance Assessment   Five times sit to stand comments   25.78 seconds from chair without UE support and supervision from therapist       [place] to Stand  Able to stand without using hands and stabilize independently     Standing Unsupported  Able to stand safely 2 minutes    Sitting with Back Unsupported but Feet Supported on Floor or Stool  Able to sit safely and securely 2 minutes    Stand to Sit  Sits safely with minimal use of hands    Transfers  Able to transfer safely, minor use of hands    Standing Unsupported with Eyes Closed  Able to stand 10 seconds safely    Standing Unsupported with Feet Together  Able to place feet together independently and stand 1 minute safely    From Standing, Reach Forward with Outstretched Arm  Can reach confidently >25 cm (10")    From Standing Position, Pick up Object from Floor  Able to pick up shoe, needs supervision    From Standing Position, Turn to Look Behind Over each Shoulder  Looks behind from both sides and weight shifts well    Turn 360 Degrees  Able to turn 360 degrees safely in 4 seconds or less    Standing Unsupported, Alternately Place Feet on Step/Stool  Able to complete >2 steps/needs minimal assist    Standing Unsupported, One Foot in Front  Able to take small step independently and hold 30 seconds    Standing on One Leg  Able to lift leg independently and hold equal to or more than 3 seconds    Total Score  48    Berg comment:  48/56                   [facility] Adult PT Treatment/Exercise - 12/30/18 1638      Ambulation/Gait   Stairs  Yes    Stairs Assistance  5: Supervision;4: Min guard    Stairs Assistance Details (indicate cue type and reason)  Pt able to perform alternating pattern with one rail today, takes increased time while descending stairs    Stair Management Technique  Alternating pattern;One rail Right    Number of Stairs  4    Height of Stairs  6             PT Education - 12/30/18 1811    Education Details  progress towards LTGs    Person(s) Educated  Patient    Methods  Explanation    Comprehension  Verbalized understanding       PT Short Term Goals - 12/31/18 1048      PT SHORT TERM GOAL #1   Title  =LTGs         PT Long Term Goals -  12/30/18 1627      PT LONG TERM GOAL #1   Title  Patient will demonstrate proper and safe gait mechanics using SPC with no verbal cues from therapist in order to increase safety in the community.  (LTG due by 01/30/19)    Baseline  pt continues to initially require minimal verbal cues from therapist for sequencing - with good carryover throughout session    Time  4    Period  Weeks    Status  Partially Met      PT LONG TERM GOAL #2   Title  Patient will increase BERG score to at least 52/56 in order to decrease risk of falls.    Baseline  48/56 on 12/30/18    Time  4    Period  Weeks    Status  On-going      PT LONG TERM GOAL #3   Title  Patient will ascend/descend 4 steps with LRAD using a step over step pattern and mod I with one railing in order for patient to safely enter and exit her home.    Baseline  4 with 1 rail with alternating sequence and supervision/min guard    Time  4    Period  Weeks    Status  On-going      PT LONG TERM GOAL #4   Title  Patient will increase gait speed to at least 2.8 ft/sec with cane in order to help decrease her risk of falls and improve community mobility.    Baseline  2.44 ft/sec on 12/30/18, with correct gait sequencing    Time  4    Period  Weeks    Status  On-going      PT LONG TERM GOAL #5   Title  Patient will decrease 5 times sit <> stand time to at least 17 seconds from chair without UE support in order to improve functional LE strength for transfers.    Baseline  25.78 seconds on 12/30/18 with no UE support from chair    Time  4    Period  Weeks    Status  On-going            Plan - 12/30/18 1819    Clinical Impression Statement  Focus of today's session was assessing pt's LTGs. Pt has not yet met any LTGs, but is progressing towards goals. Pt's gait speed with SPC has improved since evaluation, but has remained stable since it was last assessed (2.4 ft/sec). However, pt demonstrates a safer gait  sequence with correct mechanics with SPC. Pt had minimal improvement in her BERG score and is continuing to have difficulty with SLS and tandem stance balance; this score continues to put her at a moderate risk for falls. Pt demonstrates an increased time today to perform 5x sit to stand from lower chair with no UE support. Pt will continue to benefit from PT in order to reach LTGs to address balance, gait mechanics, and LE strength.    Personal Factors and Comorbidities  Comorbidity 1;Profession;Past/Current Experience;Behavior Pattern;Comorbidity 2;Comorbidity 3+    Comorbidities  Hypertension, diabetes, obesity, asthma, hx of CVA, carotid artery disease    Examination-Activity Limitations  Locomotion Level;Squat;Stand    Examination-Participation Restrictions  Community Activity   Working as a Pharmacist, hospital, walking at the [employer]   Stability/Clinical Decision Making  Evolving/Moderate complexity    Rehab Potential  Good    PT Frequency  2x / week    PT Duration  4 weeks  PT Treatment/Interventions  Gait training;Stair training;Functional mobility training;Therapeutic activities;Therapeutic exercise;Balance training;Patient/family education;Neuromuscular re-education    PT Next Visit Plan  SLS. Gait with dual tasking/on uneven surfaces. Balance with more narrow BOS/compliant surfaces/eyes closed. Hip and ankle strategy.  Half turns/quarter turns with SPC.    PT Home Exercise Plan  [id number]    Consulted and Agree with Plan of Care  Patient       Patient will benefit from skilled therapeutic intervention in order to improve the following deficits and impairments:  Abnormal gait, Decreased balance, Decreased mobility, Decreased coordination, Decreased safety awareness, Decreased strength, Difficulty walking, Decreased activity tolerance  Visit Diagnosis: 1. Hemiplegia and hemiparesis following cerebral infarction affecting right dominant side ([place])   2. Unsteadiness on feet   3. Abnormal posture   4.  Other lack of coordination   5. Other abnormalities of gait and mobility   6. Muscle weakness (generalized)        Problem List Patient Active Problem List   Diagnosis Date Noted  . Dermatitis of eyelid, contact or allergic 07/31/2018  . Right sided weakness 07/14/2018  . Left thalamic infarction ([place]) 07/12/2018  . Carotid artery disease ([place]) 07/09/2018  . Numbness and tingling 12/17/2017  . Right foot drop 12/17/2017  . Atherosclerosis of aorta ([place]) 10/09/2017  . Bariatric surgery status, 07/2017 08/19/2017  . Constipation 08/19/2017  . Hypertensive urgency 05/23/2017  . Greater trochanteric bursitis of both hips 12/19/2016  . OSA (obstructive sleep apnea) 07/26/2016  . Hypersomnia 06/11/2016  . Diabetes ([place]) 07/19/2015  . History of colonic polyps 12/01/2014  . Morbidly obese ([place]) 10/05/2013  . Partial nontraumatic tear of right rotator cuff 09/21/2013  . Goiter 12/10/2011  . Asthma 12/10/2011  . 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 History of TIA (transient ischemic attack) 02/13/2011  . PROTEINURIA, MILD 09/15/2009  . Hyperlipidemia 12/01/2006  . Essential hypertension 07/07/2006    Arliss Journey, PT, DPT 12/30/2018, 6:19 PM  [facility] [address] [place], [place], [address] Phone: [phone]   Fax:  [phone]  Name: Rebekah Kennedy MRN: [id number] Date of Birth: [birth year]

## 2018-12-31 ENCOUNTER — Ambulatory Visit: Payer: BC Managed Care – PPO | Admitting: Physical Therapy

## 2019-01-01 ENCOUNTER — Ambulatory Visit: Payer: Self-pay | Admitting: Physical Therapy

## 2019-01-01 ENCOUNTER — Ambulatory Visit: Payer: BC Managed Care – PPO | Admitting: Physical Therapy

## 2019-01-04 ENCOUNTER — Other Ambulatory Visit: Payer: Self-pay | Admitting: Internal Medicine

## 2019-01-05 ENCOUNTER — Ambulatory Visit: Payer: BC Managed Care – PPO | Admitting: Physical Therapy

## 2019-01-05 ENCOUNTER — Encounter: Payer: BC Managed Care – PPO | Admitting: Occupational Therapy

## 2019-01-05 ENCOUNTER — Encounter: Payer: Self-pay | Admitting: Occupational Therapy

## 2019-01-05 ENCOUNTER — Other Ambulatory Visit: Payer: Self-pay

## 2019-01-05 ENCOUNTER — Encounter: Payer: Self-pay | Admitting: Physical Therapy

## 2019-01-05 ENCOUNTER — Ambulatory Visit: Payer: BC Managed Care – PPO | Admitting: Occupational Therapy

## 2019-01-05 DIAGNOSIS — R293 Abnormal posture: Secondary | ICD-10-CM

## 2019-01-05 DIAGNOSIS — M6281 Muscle weakness (generalized): Secondary | ICD-10-CM

## 2019-01-05 DIAGNOSIS — R2681 Unsteadiness on feet: Secondary | ICD-10-CM

## 2019-01-05 DIAGNOSIS — I69351 Hemiplegia and hemiparesis following cerebral infarction affecting right dominant side: Secondary | ICD-10-CM | POA: Diagnosis not present

## 2019-01-05 DIAGNOSIS — R278 Other lack of coordination: Secondary | ICD-10-CM

## 2019-01-05 DIAGNOSIS — R2689 Other abnormalities of gait and mobility: Secondary | ICD-10-CM

## 2019-01-05 NOTE — Therapy (Signed)
[facility] [address] [place], [place], [address] Phone: [phone]   Fax:  [phone]  Physical Therapy Treatment  Patient Details  Name: Rebekah Kennedy MRN: [id number] Date of Birth: [birth year] Referring Provider (PT): Billey Gosling, MD   Encounter Date: 01/05/2019  PT End of Session - 01/05/19 1144    Visit Number  10    Number of Visits  17    Date for PT Re-Evaluation  01/30/19    Authorization Type  BCBS    PT Start Time  1020    PT Stop Time  1100    PT Time Calculation (min)  40 min    Equipment Utilized During Treatment  Gait belt    Activity Tolerance  Patient tolerated treatment well    Behavior During Therapy  [facility] for tasks assessed/performed       Past Medical History:  Diagnosis Date  . ASTHMA NOS W/ACUTE EXACERBATION 07/10/2010  . Cervical dysplasia   . DEPRESSION 03/16/2007  . DIABETES MELLITUS, TYPE II 12/01/2006  . Dysmenorrhea   . Fibroid   . HYPERLIPIDEMIA 12/01/2006  . HYPERTENSION 07/07/2006  . Neuropathy   . Obesity   . Osteopenia 05/2017   T score -1.5 FRAX 2.6% / 0.1%  . Retinal edema    gets steriod inj in the eyes    . Sleep apnea    HAS CPAP BUT ADMITS DOES NOT USE JUDICIALLY   . Splenomegaly    in college  . Stroke [facility]) 2012   R pontine, mild residual left hemiparesis  . TIA (transient ischemic attack) 2012   2 TIAs 1 week apart of each other     Past Surgical History:  Procedure Laterality Date  . 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 APPENDECTOMY    . BIOPSY THYROID  05/02/11   Nonneoplastic goiter  . BREAST BIOPSY    . BREAST LUMPECTOMY WITH RADIOACTIVE SEED LOCALIZATION Left 07/25/2017   Procedure: LEFT BREAST LUMPECTOMY WITH RADIOACTIVE SEED LOCALIZATION;  Surgeon: Coralie Keens, MD;  Location: [place];  Service: General;  Laterality: Left;  . BREAST SURGERY     Reduction  . COLPOSCOPY    . DILATION AND CURETTAGE OF UTERUS  1975   DUB  . EYE SURGERY      Laser  . GASTRIC ROUX-EN-Y N/A 08/12/2017   Procedure: LAPAROSCOPIC ROUX-EN-Y GASTRIC BYPASS WITH HIATAL HERNIA REPAIR AND UPPER ENDOSCOPY;  Surgeon: [name], [name], MD;  Location: [facility];  Service: General;  Laterality: N/A;  . GYNECOLOGIC CRYOSURGERY    . MYOMECTOMY    . OVARIAN CYST REMOVAL    . PELVIC LAPAROSCOPY  75,88   DL lysis of adhesions    There were no vitals filed for this visit.  Subjective Assessment - 01/05/19 1024    Subjective  Had a pretty calm weekend. Has her first aquatic therapy appointment this weekend.    Pertinent History  L thalamic stroke (06/2018), R pontine stroke (2012), HTN, Uncontrolled diabetes    Limitations  Walking;Writing    Patient Stated Goals  Wants to improve her balance and get back to walking at the [facility] (when safely able to)                       [facility] Adult PT Treatment/Exercise - 01/05/19 1142      High Level Balance   High Level Balance Comments  In // bars with min guard provided for all balance:  On foam balance beam anterior stepping 2 x 10 reps B, with one hand UE support with occasional no UE support. On solid, level surface 2 x 10 reps B lateral stepping strategy with cueing for increased foot clearance, no UE support with intermittent need for fingertips on bars. R side stepping x6 reps over hurdle, L side stepping x6 reps over hurdle - pt with 2 episodes of difficulty clearing RLE over hurdle. On 8" step 1 x 10 reps B toe taps to focus on SLS - pt has increased difficulty with SLS on L, Progressing to SLS holds B; 3 x 15 seconds, pt with 1 episode of R knee buckling during SLS and req min A from therapist. Brief standing rest breaks in between.               PT Education - 01/05/19 1142    Education Details  stepping strategy for balance    Person(s) Educated  Patient    Methods  Explanation    Comprehension  Verbalized understanding;Returned demonstration       PT Short Term Goals - 12/31/18 1048       PT SHORT TERM GOAL #1   Title  =LTGs        PT Long Term Goals - 12/30/18 1627      PT LONG TERM GOAL #1   Title  Patient will demonstrate proper and safe gait mechanics using SPC with no verbal cues from therapist in order to increase safety in the community.  (LTG due by 01/30/19)    Baseline  pt continues to initially require minimal verbal cues from therapist for sequencing - with good carryover throughout session    Time  4    Period  Weeks    Status  Partially Met      PT LONG TERM GOAL #2   Title  Patient will increase BERG score to at least 52/56 in order to decrease risk of falls.    Baseline  48/56 on 12/30/18    Time  4    Period  Weeks    Status  On-going      PT LONG TERM GOAL #3   Title  Patient will ascend/descend 4 steps with LRAD using a step over step pattern and mod I with one railing in order for patient to safely enter and exit her home.    Baseline  4 with 1 rail with alternating sequence and supervision/min guard    Time  4    Period  Weeks    Status  On-going      PT LONG TERM GOAL #4   Title  Patient will increase gait speed to at least 2.8 ft/sec with cane in order to help decrease her risk of falls and improve community mobility.    Baseline  2.44 ft/sec on 12/30/18, with correct gait sequencing    Time  4    Period  Weeks    Status  On-going      PT LONG TERM GOAL #5   Title  Patient will decrease 5 times sit <> stand time to at least 17 seconds from chair without UE support in order to improve functional LE strength for transfers.    Baseline  25.78 seconds on 12/30/18 with no UE support from chair    Time  4    Period  Weeks    Status  On-going            Plan - 01/05/19 1145  Clinical Impression Statement  Focus of today's session was dynamic balance on compliant surfaces and SLS. Pt required min guard for all balance activties today and one episode on min A when pt had an episode of knee buckling on R when performing SLS activities.  Pt reports increased difficulty with SLS on L when her RLE either has to step on a step or over a hurdle. Will continue to progress towards LTGs.    Personal Factors and Comorbidities  Comorbidity 1;Profession;Past/Current Experience;Behavior Pattern;Comorbidity 2;Comorbidity 3+    Comorbidities  Hypertension, diabetes, obesity, asthma, hx of CVA, carotid artery disease    Examination-Activity Limitations  Locomotion Level;Squat;Stand    Examination-Participation Restrictions  Community Activity   Working as a Pharmacist, hospital, walking at the [employer]  Evolving/Moderate complexity    Rehab Potential  Good    PT Frequency  2x / week    PT Duration  4 weeks    PT Treatment/Interventions  Gait training;Stair training;Functional mobility training;Therapeutic activities;Therapeutic exercise;Balance training;Patient/family education;Neuromuscular re-education    PT Next Visit Plan  SLS bilaterally. Balance on compliant surfaces, EC, narrow BOS. Gait with dual tasking/on uneven surfaces/scanning environment.  Hip ABD strengthening.    PT Home Exercise Plan  [id number]    Consulted and Agree with Plan of Care  Patient       Patient will benefit from skilled therapeutic intervention in order to improve the following deficits and impairments:  Abnormal gait, Decreased balance, Decreased mobility, Decreased coordination, Decreased safety awareness, Decreased strength, Difficulty walking, Decreased activity tolerance  Visit Diagnosis: 1. Hemiplegia and hemiparesis following cerebral infarction affecting right dominant side ([place])   2. Unsteadiness on feet   3. Abnormal posture   4. Other lack of coordination   5. Other abnormalities of gait and mobility   6. Muscle weakness (generalized)        Problem List Patient Active Problem List   Diagnosis Date Noted  . Dermatitis of eyelid, contact or allergic 07/31/2018  . Right sided weakness 07/14/2018  . Left thalamic  infarction ([place]) 07/12/2018  . Carotid artery disease ([place]) 07/09/2018  . Numbness and tingling 12/17/2017  . Right foot drop 12/17/2017  . Atherosclerosis of aorta ([place]) 10/09/2017  . Bariatric surgery status, 07/2017 08/19/2017  . Constipation 08/19/2017  . Hypertensive urgency 05/23/2017  . Greater trochanteric bursitis of both hips 12/19/2016  . OSA (obstructive sleep apnea) 07/26/2016  . Hypersomnia 06/11/2016  . Diabetes ([place]) 07/19/2015  . History of colonic polyps 12/01/2014  . Morbidly obese ([place]) 10/05/2013  . Partial nontraumatic tear of right rotator cuff 09/21/2013  . Goiter 12/10/2011  . Asthma 12/10/2011  . 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 History of TIA (transient ischemic attack) 02/13/2011  . PROTEINURIA, MILD 09/15/2009  . Hyperlipidemia 12/01/2006  . Essential hypertension 07/07/2006    Arliss Journey, PT, DPT 01/05/2019, 11:52 AM  [facility] [address] [place], [place], [address] Phone: [phone]   Fax:  [phone]  Name: Rebekah Kennedy MRN: [id number] Date of Birth: June 27, 1955

## 2019-01-05 NOTE — Therapy (Signed)
[facility] [address] [place], [place], [address] Phone: [phone]   Fax:  [phone]  Occupational Therapy Treatment  Patient Details  Name: Rebekah Kennedy MRN: [id number] Date of Birth: [birth year] Referring Provider (OT): Dr. Billey Gosling   Encounter Date: 01/05/2019  OT End of Session - 01/05/19 1749    Visit Number  5    Number of Visits  16    Date for OT Re-Evaluation  02/04/19    Authorization Type  BCBS [place]    OT Start Time  1417    OT Stop Time  1501    OT Time Calculation (min)  44 min    Activity Tolerance  Patient tolerated treatment well       Past Medical History:  Diagnosis Date  . ASTHMA NOS W/ACUTE EXACERBATION 07/10/2010  . Cervical dysplasia   . DEPRESSION 03/16/2007  . DIABETES MELLITUS, TYPE II 12/01/2006  . Dysmenorrhea   . Fibroid   . HYPERLIPIDEMIA 12/01/2006  . HYPERTENSION 07/07/2006  . Neuropathy   . Obesity   . Osteopenia 05/2017   T score -1.5 FRAX 2.6% / 0.1%  . Retinal edema    gets steriod inj in the eyes    . Sleep apnea    HAS CPAP BUT ADMITS DOES NOT USE JUDICIALLY   . Splenomegaly    in college  . Stroke [facility]) 2012   R pontine, mild residual left hemiparesis  . TIA (transient ischemic attack) 2012   2 TIAs 1 week apart of each other     Past Surgical History:  Procedure Laterality Date  . 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 APPENDECTOMY    . BIOPSY THYROID  05/02/11   Nonneoplastic goiter  . BREAST BIOPSY    . BREAST LUMPECTOMY WITH RADIOACTIVE SEED LOCALIZATION Left 07/25/2017   Procedure: LEFT BREAST LUMPECTOMY WITH RADIOACTIVE SEED LOCALIZATION;  Surgeon: Coralie Keens, MD;  Location: [facility];  Service: General;  Laterality: Left;  . BREAST SURGERY     Reduction  . COLPOSCOPY    . DILATION AND CURETTAGE OF UTERUS  1975   DUB  . EYE SURGERY     Laser  . GASTRIC ROUX-EN-Y N/A 08/12/2017   Procedure: LAPAROSCOPIC ROUX-EN-Y GASTRIC BYPASS WITH HIATAL  HERNIA REPAIR AND UPPER ENDOSCOPY;  Surgeon: [name], [name], MD;  [facility];  Service: General;  Laterality: N/A;  . GYNECOLOGIC CRYOSURGERY    . MYOMECTOMY    . OVARIAN CYST REMOVAL    . PELVIC LAPAROSCOPY  75,88   DL lysis of adhesions    There were no vitals filed for this visit.  Subjective Assessment - 01/05/19 1746    Subjective   I am a little nervous about this pool stuff    Pertinent History  Pt with L thalamic stroke in Feb 2020 as well as R pontine stroke in 2012.  HTN, Diabetes, HLD, depression.    Patient Stated Goals  My R hand sometimes does its own thing and I want to be able to write better.    Currently in Pain?  No/denies         Patient seen for aquatic therapy today.  Treatment took place in water 2.5-4 feet deep depending upon activity.  Pt entered the pool via stairs using 2 hand railings and min guard assist.  This was pt's first session for aquatic therapy - initially focused on basic pt education related to principles of water in order  to prepare pt for experience and to allow pt to use this information later on to adjust focus with HEP.  Pt verbalized basic understanding however will need reinforcement.  Treatment initially focused on allowing pt to begin to experience functional ambulation in the water - water provides immediate feedback related to control of COG over BOS when pt attempting to weight shift onto RLE (pt with posterior bias and attempts lateral weight shift vs anterior lateral weight shift).  Also focused on pt's awareness of lack of activity in RLE as evidenced by increased buoyancy of RLE due to less muscle activity.  Initially pt required mod assist using dumb bell bar for BUE support.  Cues for pt to over R foot over pool floor to increase activity and decrease buoyancy of RLE - with practice, cueing and min- mod facilitation pt progressed to requiring only intermittent min assist.  Pt also requires assist due to unpredictable ataxic  movements in RLE and RUE.  Focused on RLE strengthening for hip ab/adduction as well as hip extension with knee extension and ankle dorsiflexion using slow small movements - pt needed mod facilitation for trunk alignment and control with this activity as well as BUE support on pool wall (10 reps x2 of each).  Using small noodle focused on RLE strengthening and control for single leg marching with cues to pt to control noodle in both directions. Pt needed mod cues and min facilitation for postural control and alignment.  After increased activity of RLE and trunk, pt then able to focus on functional ambulation the length of the pool x4 with close supervision and only min guard on 4 occassions (pt required rest breaks).  Also focused on UE strengthening as well as core strengthening in sitting using dumb bell bar for chest presses with bar partially submerged with cues, assist for LE's and trunk control 10 reps x2.  Pt exited the pool via steps and min guard assist. Once on land, pt demonstrated improved control with dynamic standing balance/functional ambulation and decreased ataxic movement.                       OT Short Term Goals - 01/05/19 1746      OT SHORT TERM GOAL #1   Title  Pt will be mod I for RUE coordination HEP ( 01/07/19 goal date adjusted as pt will be on hold for first two weeks of August due to scheduling)    Status  On-going      OT SHORT TERM GOAL #2   Title  Pt will demonstrate improved coordination as evidenced by decreasing time on 9 hole peg by at least 4 seconds to assist with fine motor tasks    Baseline  31.93    Status  On-going      OT SHORT TERM GOAL #3   Title  Pt will report greater ease in using RUE to brush teeth    Status  On-going        OT Long Term Goals - 01/05/19 1747      OT LONG TERM GOAL #1   Title  Pt will be mod I with upgraded HEP for functional use of RUE/balance/activity tolerance - 916/20 ( date adjusted as pt will be on hold  first two weeks of August due to scheduling)    Status  On-going      OT LONG TERM GOAL #2   Title  Pt will demonstrate improved RUE coordination as evidenced by  decreasing time on 9 hole peg test by at least 6 seconds to assist with fine motor tasks    Baseline  31.93    Status  On-going      OT LONG TERM GOAL #3   Title  Pt will demonstrate ability to use RUE to pick up, drink and set cup down without spilling using RUE.    Status  On-going      OT LONG TERM GOAL #4   Title  Pt will be able to write 3 sentence paragraph legibly, AE prn    Status  On-going      OT LONG TERM GOAL #5   Title  Pt will demonstrate ability to walk outside on uneven ground mod I for return to leisure and work related activities    Status  On-going      OT [place] #6   Title  Pt will demonstrate ability to complete simple shopping task mod I    Status  On-going            Plan - 01/05/19 1747    Clinical Impression Statement  Pt demonstrating improved dynamic standing balance and postural control on land following first aquatic therapy session    OT Occupational Profile and History  Detailed Assessment- Review of Records and additional review of physical, cognitive, psychosocial history related to current functional performance    Occupational performance deficits (Please refer to evaluation for details):  ADL's;IADL's;Work;Leisure;Social Participation    Body Structure / Function / Physical Skills  ADL;Balance;Coordination;FMC;GMC;IADL;Muscle spasms;Tone;UE functional use    Rehab Potential  Good    Clinical Decision Making  Several treatment options, min-mod task modification necessary    Comorbidities Affecting Occupational Performance:  May have comorbidities impacting occupational performance    Modification or Assistance to Complete Evaluation   Min-Moderate modification of tasks or assist with assess necessary to complete eval    OT Frequency  2x / week    OT Duration  8 weeks    OT  Treatment/Interventions  Self-care/ADL training;Aquatic Therapy;DME and/or AE instruction;Neuromuscular education;Therapeutic exercise;Functional Mobility Training;Manual Therapy;Therapeutic activities;Patient/family education;Balance training    Plan  check STG's, fine motor activities, bilateral functional hand activities, NMR for balance (incorporate activity tolerance) and educate pt on weight bearing techniques to assist with control of RUE, practice wrting for single letters and numbers,    Consulted and Agree with Plan of Care  Patient       Patient will benefit from skilled therapeutic intervention in order to improve the following deficits and impairments:   Body Structure / Function / Physical Skills: ADL, Balance, Coordination, FMC, GMC, IADL, Muscle spasms, Tone, UE functional use       Visit Diagnosis: 1. Hemiplegia and hemiparesis following cerebral infarction affecting right dominant side ([place])   2. Unsteadiness on feet   3. Abnormal posture   4. Other lack of coordination       Problem List Patient Active Problem List   Diagnosis Date Noted  . Dermatitis of eyelid, contact or allergic 07/31/2018  . Right sided weakness 07/14/2018  . Left thalamic infarction ([place]) 07/12/2018  . Carotid artery disease ([place]) 07/09/2018  . Numbness and tingling 12/17/2017  . Right foot drop 12/17/2017  . Atherosclerosis of aorta ([place]) 10/09/2017  . Bariatric surgery status, 07/2017 08/19/2017  . Constipation 08/19/2017  . Hypertensive urgency 05/23/2017  . Greater trochanteric bursitis of both hips 12/19/2016  . OSA (obstructive sleep apnea) 07/26/2016  . Hypersomnia 06/11/2016  . Diabetes (  [place]) 07/19/2015  . History of colonic polyps 12/01/2014  . Morbidly obese ([place]) 10/05/2013  . Partial nontraumatic tear of right rotator cuff 09/21/2013  . Goiter 12/10/2011  . Asthma 12/10/2011  . 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 History of TIA (transient ischemic attack)  02/13/2011  . PROTEINURIA, MILD 09/15/2009  . Hyperlipidemia 12/01/2006  . Essential hypertension 07/07/2006    Quay Burow, OTR/L 01/05/2019, 5:54 PM  [facility] [address] [place], [place], [address] Phone: [phone]   Fax:  [phone]  Name: Rebekah Kennedy MRN: [id number] Date of Birth: Sep 28, 1955

## 2019-01-07 ENCOUNTER — Encounter: Payer: Self-pay | Admitting: Rehabilitative and Restorative Service Providers"

## 2019-01-07 ENCOUNTER — Ambulatory Visit: Payer: BC Managed Care – PPO | Admitting: Occupational Therapy

## 2019-01-07 ENCOUNTER — Ambulatory Visit: Payer: BC Managed Care – PPO | Admitting: Rehabilitative and Restorative Service Providers"

## 2019-01-07 ENCOUNTER — Other Ambulatory Visit: Payer: Self-pay

## 2019-01-07 ENCOUNTER — Encounter: Payer: Self-pay | Admitting: Occupational Therapy

## 2019-01-07 DIAGNOSIS — R2689 Other abnormalities of gait and mobility: Secondary | ICD-10-CM

## 2019-01-07 DIAGNOSIS — R2681 Unsteadiness on feet: Secondary | ICD-10-CM

## 2019-01-07 DIAGNOSIS — I69351 Hemiplegia and hemiparesis following cerebral infarction affecting right dominant side: Secondary | ICD-10-CM

## 2019-01-07 DIAGNOSIS — M6281 Muscle weakness (generalized): Secondary | ICD-10-CM

## 2019-01-07 DIAGNOSIS — R278 Other lack of coordination: Secondary | ICD-10-CM

## 2019-01-07 DIAGNOSIS — R293 Abnormal posture: Secondary | ICD-10-CM

## 2019-01-07 NOTE — Therapy (Signed)
[facility] [address] [place], [place], [address] Phone: [phone]   Fax:  [phone]  Occupational Therapy Treatment  Patient Details  Name: Rebekah Kennedy MRN: [id number] Date of Birth: 07-10-1955 Referring Provider (OT): Dr. Billey Gosling   Encounter Date: 01/07/2019  OT End of Session - 01/07/19 1136    Visit Number  6    Number of Visits  16    Date for OT Re-Evaluation  02/04/19    Authorization Type  BCBS [place]    OT Start Time  1102    OT Stop Time  1144    OT Time Calculation (min)  42 min    Activity Tolerance  Patient tolerated treatment well       Past Medical History:  Diagnosis Date  . ASTHMA NOS W/ACUTE EXACERBATION 07/10/2010  . Cervical dysplasia   . DEPRESSION 03/16/2007  . DIABETES MELLITUS, TYPE II 12/01/2006  . Dysmenorrhea   . Fibroid   . HYPERLIPIDEMIA 12/01/2006  . HYPERTENSION 07/07/2006  . Neuropathy   . Obesity   . Osteopenia 05/2017   T score -1.5 FRAX 2.6% / 0.1%  . Retinal edema    gets steriod inj in the eyes    . Sleep apnea    HAS CPAP BUT ADMITS DOES NOT USE JUDICIALLY   . Splenomegaly    in college  . Stroke [facility]) 2012   R pontine, mild residual left hemiparesis  . TIA (transient ischemic attack) 2012   2 TIAs 1 week apart of each other     Past Surgical History:  Procedure Laterality Date  . 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 APPENDECTOMY    . BIOPSY THYROID  05/02/11   Nonneoplastic goiter  . BREAST BIOPSY    . BREAST LUMPECTOMY WITH RADIOACTIVE SEED LOCALIZATION Left 07/25/2017   Procedure: LEFT BREAST LUMPECTOMY WITH RADIOACTIVE SEED LOCALIZATION;  Surgeon: Coralie Keens, MD;  Location: [facility];  Service: General;  Laterality: Left;  . BREAST SURGERY     Reduction  . COLPOSCOPY    . DILATION AND CURETTAGE OF UTERUS  1975   DUB  . EYE SURGERY     Laser  . GASTRIC ROUX-EN-Y N/A 08/12/2017   Procedure: LAPAROSCOPIC ROUX-EN-Y GASTRIC BYPASS WITH HIATAL  HERNIA REPAIR AND UPPER ENDOSCOPY;  Surgeon: [name], [name], MD;  Location: [facility];  Service: General;  Laterality: N/A;  . GYNECOLOGIC CRYOSURGERY    . MYOMECTOMY    . OVARIAN CYST REMOVAL    . PELVIC LAPAROSCOPY  75,88   DL lysis of adhesions    There were no vitals filed for this visit.  Subjective Assessment - 01/07/19 1108    Subjective   I really liked working in the pool - I slept great that night.    Pertinent History  Pt with L thalamic stroke in Feb 2020 as well as R pontine stroke in 2012.  HTN, Diabetes, HLD, depression.    Patient Stated Goals  My R hand sometimes does its own thing and I want to be able to write better.    Currently in Pain?  No/denies                   OT Treatments/Exercises (OP) - 01/07/19 0001      ADLs   Eating  Addressed using RUE to pick up and drink from cup.  Recommended that pt first use cup with lid in order to allow for safe practice  usinng RUE.  When pt focuses her attention on her RUE and slows movement down pt demonstrates improved functional use of RUE.  Recommended that pt use covered cup initially then progres to uncovered with glass half full then progress as able to cup with full liquid. Pt's RUE control does decrease with repetition however with BRIEF rest and interruption pt immediately demonstrates improved control.     Writing  Addressed hand writing with focus on increasing length of what pt is writing (goal of at least 3 longer sentences). Pt is using coban wrap to assist with control of pen when writing. Legibility is 100% - pt rates fatigue at   /10 with 3 shorter sentences     ADL Comments  Checked all STG"s - pt has met all STG's and 1 LTG.  Pt has made excellent progress.               OT Education - 01/07/19 1134    Education Details  stratgies for use of RUE with drinking from cup    Person(s) Educated  Patient    Methods  Explanation;Demonstration    Comprehension  Verbalized  understanding;Returned demonstration       OT Short Term Goals - 01/07/19 1112      OT SHORT TERM GOAL #1   Title  Pt will be mod I for RUE coordination HEP ( 01/07/19 goal date adjusted as pt will be on hold for first two weeks of August due to scheduling)    Status  Achieved      OT SHORT TERM GOAL #2   Title  Pt will demonstrate improved coordination as evidenced by decreasing time on 9 hole peg by at least 4 seconds to assist with fine motor tasks    Baseline  31.93    Status  Achieved   01/07/2019  23.99     OT SHORT TERM GOAL #3   Title  Pt will report greater ease in using RUE to brush teeth    Status  Achieved   Pt reports much greater control when brushing her teeth       OT Long Term Goals - 01/07/19 1119      OT LONG TERM GOAL #1   Title  Pt will be mod I with upgraded HEP for functional use of RUE/balance/activity tolerance - 916/20 ( date adjusted as pt will be on hold first two weeks of August due to scheduling)    Status  On-going      OT LONG TERM GOAL #2   Title  Pt will demonstrate improved RUE coordination as evidenced by decreasing time on 9 hole peg test by at least 6 seconds to assist with fine motor tasks    Baseline  31.93    Status  Achieved   01/07/2019 23.99     OT LONG TERM GOAL #3   Title  Pt will demonstrate ability to use RUE to pick up, drink and set cup down without spilling using RUE.    Status  On-going      OT LONG TERM GOAL #4   Title  Pt will be able to write 3 sentence paragraph legibly, AE prn    Status  On-going      OT LONG TERM GOAL #5   Title  Pt will demonstrate ability to walk outside on uneven ground mod I for return to leisure and work related activities    Status  On-going      [place]  GOAL #6   Title  Pt will demonstrate ability to complete simple shopping task mod I    Status  On-going            Plan - 01/07/19 1135    Clinical Impression Statement  Pt is making excellent gains toward goals and has met  all STG's and 1 LTG at this time.    OT Occupational Profile and History  Detailed Assessment- Review of Records and additional review of physical, cognitive, psychosocial history related to current functional performance    Occupational performance deficits (Please refer to evaluation for details):  ADL's;IADL's;Work;Leisure;Social Participation    Body Structure / Function / Physical Skills  ADL;Balance;Coordination;FMC;GMC;IADL;Muscle spasms;Tone;UE functional use    Rehab Potential  Good    Clinical Decision Making  Several treatment options, min-mod task modification necessary    Comorbidities Affecting Occupational Performance:  May have comorbidities impacting occupational performance    Modification or Assistance to Complete Evaluation   Min-Moderate modification of tasks or assist with assess necessary to complete eval    OT Frequency  2x / week    OT Duration  8 weeks    OT Treatment/Interventions  Self-care/ADL training;Aquatic Therapy;DME and/or AE instruction;Neuromuscular education;Therapeutic exercise;Functional Mobility Training;Manual Therapy;Therapeutic activities;Patient/family education;Balance training    Plan  fine motor activities, bilateral functional hand activities, NMR for balance (incorporate activity tolerance), practice wrting for single letters and numbers,aquatic therapy    Consulted and Agree with Plan of Care  Patient       Patient will benefit from skilled therapeutic intervention in order to improve the following deficits and impairments:   Body Structure / Function / Physical Skills: ADL, Balance, Coordination, FMC, GMC, IADL, Muscle spasms, Tone, UE functional use       Visit Diagnosis: 1. Hemiplegia and hemiparesis following cerebral infarction affecting right dominant side ([place])   2. Unsteadiness on feet   3. Abnormal posture   4. Other lack of coordination       Problem List Patient Active Problem List   Diagnosis Date Noted  . Dermatitis of  eyelid, contact or allergic 07/31/2018  . Right sided weakness 07/14/2018  . Left thalamic infarction ([place]) 07/12/2018  . Carotid artery disease ([place]) 07/09/2018  . Numbness and tingling 12/17/2017  . Right foot drop 12/17/2017  . Atherosclerosis of aorta ([place]) 10/09/2017  . Bariatric surgery status, 07/2017 08/19/2017  . Constipation 08/19/2017  . Hypertensive urgency 05/23/2017  . Greater trochanteric bursitis of both hips 12/19/2016  . OSA (obstructive sleep apnea) 07/26/2016  . Hypersomnia 06/11/2016  . Diabetes ([place]) 07/19/2015  . History of colonic polyps 12/01/2014  . Morbidly obese ([place]) 10/05/2013  . Partial nontraumatic tear of right rotator cuff 09/21/2013  . Goiter 12/10/2011  . Asthma 12/10/2011  . 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 History of TIA (transient ischemic attack) 02/13/2011  . PROTEINURIA, MILD 09/15/2009  . Hyperlipidemia 12/01/2006  . Essential hypertension 07/07/2006    Rebekah Kennedy, Rebekah Kennedy 01/07/2019, 6:02 PM  [facility] [address] [place], [place], [address] Phone: [phone]   Fax:  [phone]  Name: Rebekah Kennedy MRN: [id number] Date of Birth: 06/27/1955

## 2019-01-07 NOTE — Therapy (Signed)
[facility] [address] [place], [place], [address] Phone: [phone]   Fax:  [phone]  Physical Therapy Treatment  Patient Details  Name: Rebekah Kennedy MRN: [id number] Date of Birth: 22-Apr-1956 Referring Provider (PT): Billey Gosling, MD  CLINIC OPERATION CHANGES: [facility] is open at lower capacity following universal masking, social distancing, and patient screening.  The patient's COVID risk of complications score is 5.  Encounter Date: 01/07/2019  PT End of Session - 01/07/19 1114    Visit Number  11    Number of Visits  17    Date for PT Re-Evaluation  01/30/19    Authorization Type  BCBS    PT Start Time  1023    PT Stop Time  1103    PT Time Calculation (min)  40 min    Equipment Utilized During Treatment  Gait belt    Activity Tolerance  Patient tolerated treatment well    Behavior During Therapy  WFL for tasks assessed/performed       Past Medical History:  Diagnosis Date  . ASTHMA NOS W/ACUTE EXACERBATION 07/10/2010  . Cervical dysplasia   . DEPRESSION 03/16/2007  . DIABETES MELLITUS, TYPE II 12/01/2006  . Dysmenorrhea   . Fibroid   . HYPERLIPIDEMIA 12/01/2006  . HYPERTENSION 07/07/2006  . Neuropathy   . Obesity   . Osteopenia 05/2017   T score -1.5 FRAX 2.6% / 0.1%  . Retinal edema    gets steriod inj in the eyes    . Sleep apnea    HAS CPAP BUT ADMITS DOES NOT USE JUDICIALLY   . Splenomegaly    in college  . Stroke [facility]) 2012   R pontine, mild residual left hemiparesis  . TIA (transient ischemic attack) 2012   2 TIAs 1 week apart of each other     Past Surgical History:  Procedure Laterality Date  . 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 APPENDECTOMY    . BIOPSY THYROID  05/02/11   Nonneoplastic goiter  . BREAST BIOPSY    . BREAST LUMPECTOMY WITH RADIOACTIVE SEED LOCALIZATION Left 07/25/2017   Procedure: LEFT BREAST LUMPECTOMY WITH RADIOACTIVE SEED LOCALIZATION;  Surgeon:  Coralie Keens, MD;  Location: [facility];  Service: General;  Laterality: Left;  . BREAST SURGERY     Reduction  . COLPOSCOPY    . DILATION AND CURETTAGE OF UTERUS  1975   DUB  . EYE SURGERY     Laser  . GASTRIC ROUX-EN-Y N/A 08/12/2017   Procedure: LAPAROSCOPIC ROUX-EN-Y GASTRIC BYPASS WITH HIATAL HERNIA REPAIR AND UPPER ENDOSCOPY;  Surgeon: [name], [name], MD;  Location[facility];  Service: General;  Laterality: N/A;  . GYNECOLOGIC CRYOSURGERY    . MYOMECTOMY    . OVARIAN CYST REMOVAL    . PELVIC LAPAROSCOPY  75,88   DL lysis of adhesions    There were no vitals filed for this visit.  Subjective Assessment - 01/07/19 1027    Subjective  The patient reports she went to the pool for her first water appointment.  She reports the hand function varies from situation to situation    Pertinent History  L thalamic stroke (06/2018), R pontine stroke (2012), HTN, Uncontrolled diabetes    Patient Stated Goals  Wants to improve her balance and get back to walking [facility] the [facility] (when safely able to)    Currently in Pain?  No/denies         [facility] PT Assessment -  01/07/19 1042      Transfers   Five time sit to stand comments   17.63 seconds on 01/07/19      Ambulation/Gait   Ambulation/Gait Assistance Details  Patient notes that she ambulates at home without device.  She performed gait with PT providing verbal cues for wider base of support, emphasizing heel strike.    Ambulation Distance (Feet)  250 Feet   115 x 3   Gait Pattern  Step-through pattern;Narrow base of support;Lateral hip instability;Decreased dorsiflexion - right    Ambulation Surface  Level;Indoor                   [facility] Adult PT Treatment/Exercise - 01/07/19 1042      Ambulation/Gait   Ambulation/Gait  Yes    Ambulation/Gait Assistance  5: Supervision;4: Min guard    Assistive device  Straight cane;None      High Level Balance   High Level Balance Comments  Standing single leg stance activities rolling  a ball front/back with min A for safety.        Neuro Re-ed    Neuro Re-ed Details   Step ups with turns to 4" step, alternating foot taps to 6" step without UE support.      Exercises   Exercises  Knee/Hip;Other Exercises    Other Exercises   --      Knee/Hip Exercises: Stretches   Active Hamstring Stretch  Right;Left;1 rep;30 seconds    Hip Flexor Stretch  Right;Left;3 reps    Hip Flexor Stretch Limitations  prone with passive overpressure    Other Knee/Hip Stretches  hip adductor stretch PROM      Knee/Hip Exercises: Standing   Heel Raises  20 reps    Lateral Step Up  5 reps;Hand Hold: 1;Step Height: 6";Right;Left    Lateral Step Up Limitations  lateral step up + hip abduction    Step Down  10 reps    Step Down Limitations  alternating with UE support    Other Standing Knee Exercises  deep squat x 10 reps with lateral hip abduction;    Other Standing Knee Exercises  Wide to narrow stepping on 4" steps      Knee/Hip Exercises: Seated   Sit to Sand  10 reps;without UE support   hovering over mat table for quad engagement     Knee/Hip Exercises: Prone   Hamstring Curl  10 reps               PT Short Term Goals - 12/31/18 1048      PT SHORT TERM GOAL #1   Title  =LTGs        PT Long Term Goals - 01/07/19 1101      PT LONG TERM GOAL #1   Title  Patient will demonstrate proper and safe gait mechanics using SPC with no verbal cues from therapist in order to increase safety in the community.  (LTG due by 01/30/19)    Baseline  pt continues to initially require minimal verbal cues from therapist for sequencing - with good carryover throughout session    Time  4    Period  Weeks    Status  Partially Met      PT LONG TERM GOAL #2   Title  Patient will increase BERG score to at least 52/56 in order to decrease risk of falls.    Baseline  48/56 on 12/30/18    Time  4    Period  Weeks    Status  On-going      PT LONG TERM GOAL #3   Title  Patient will  ascend/descend 4 steps with LRAD using a step over step pattern and mod I with one railing in order for patient to safely enter and exit her home.    Baseline  4 with 1 rail with alternating sequence and supervision/min guard    Time  4    Period  Weeks    Status  On-going      PT LONG TERM GOAL #4   Title  Patient will increase gait speed to at least 2.8 ft/sec with cane in order to help decrease her risk of falls and improve community mobility.    Baseline  2.44 ft/sec on 12/30/18, with correct gait sequencing    Time  4    Period  Weeks    Status  On-going      PT LONG TERM GOAL #5   Title  Patient will decrease 5 times sit <> stand time to at least 17 seconds from chair without UE support in order to improve functional LE strength for transfers.    Baseline  25.78 seconds on 12/30/18 with no UE support from chair/  17.63 seconds on 01/07/19.    Time  4    Period  Weeks    Status  On-going            Plan - 01/07/19 1132    Clinical Impression Statement  The patient continues with a narrow base of support with gait and R foot slap.  PT continues to emphasize hip stabilization, LE strengthening for more power during walking, balance, and flexibility.  Continue working to [employer].  She reduced 5 time sit to stand time from approx 25 down to 17 seconds since last week.    PT Treatment/Interventions  Gait training;Stair training;Functional mobility training;Therapeutic activities;Therapeutic exercise;Balance training;Patient/family education;Neuromuscular re-education    PT Next Visit Plan  Continue SLS training, balance on compliant surfaces, narrow BOS balance training, working on hip abduction and LE strengthening, dual tasks gait with environmental scanning.    PT Home Exercise Plan  [id number]    Consulted and Agree with Plan of Care  Patient       Patient will benefit from skilled therapeutic intervention in order to improve the following deficits and impairments:  Abnormal gait,  Decreased balance, Decreased mobility, Decreased coordination, Decreased safety awareness, Decreased strength, Difficulty walking, Decreased activity tolerance  Visit Diagnosis: 1. Unsteadiness on feet   2. Other abnormalities of gait and mobility   3. Muscle weakness (generalized)        Problem List Patient Active Problem List   Diagnosis Date Noted  . Dermatitis of eyelid, contact or allergic 07/31/2018  . Right sided weakness 07/14/2018  . Left thalamic infarction ([place]) 07/12/2018  . Carotid artery disease ([place]) 07/09/2018  . Numbness and tingling 12/17/2017  . Right foot drop 12/17/2017  . Atherosclerosis of aorta ([place]) 10/09/2017  . Bariatric surgery status, 07/2017 08/19/2017  . Constipation 08/19/2017  . Hypertensive urgency 05/23/2017  . Greater trochanteric bursitis of both hips 12/19/2016  . OSA (obstructive sleep apnea) 07/26/2016  . Hypersomnia 06/11/2016  . Diabetes ([place]) 07/19/2015  . History of colonic polyps 12/01/2014  . Morbidly obese ([place]) 10/05/2013  . Partial nontraumatic tear of right rotator cuff 09/21/2013  . Goiter 12/10/2011  . Asthma 12/10/2011  . 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 History of TIA (transient ischemic attack)  02/13/2011  . PROTEINURIA, MILD 09/15/2009  . Hyperlipidemia 12/01/2006  . Essential hypertension 07/07/2006    Tarika Mckethan, PT 01/07/2019, 11:36 AM  [facility] [address] [place], [place], [address] Phone: [phone]   Fax:  [phone]  Name: Rebekah Kennedy MRN: [id number] Date of Birth: November 23, 1955

## 2019-01-12 ENCOUNTER — Encounter: Payer: Self-pay | Admitting: Occupational Therapy

## 2019-01-12 ENCOUNTER — Encounter: Payer: BC Managed Care – PPO | Admitting: Occupational Therapy

## 2019-01-12 ENCOUNTER — Other Ambulatory Visit: Payer: Self-pay

## 2019-01-12 ENCOUNTER — Ambulatory Visit: Payer: BC Managed Care – PPO | Admitting: Physical Therapy

## 2019-01-12 ENCOUNTER — Ambulatory Visit: Payer: BC Managed Care – PPO | Admitting: Occupational Therapy

## 2019-01-12 DIAGNOSIS — R278 Other lack of coordination: Secondary | ICD-10-CM

## 2019-01-12 DIAGNOSIS — M6281 Muscle weakness (generalized): Secondary | ICD-10-CM

## 2019-01-12 DIAGNOSIS — R2681 Unsteadiness on feet: Secondary | ICD-10-CM

## 2019-01-12 DIAGNOSIS — I69351 Hemiplegia and hemiparesis following cerebral infarction affecting right dominant side: Secondary | ICD-10-CM

## 2019-01-12 DIAGNOSIS — R293 Abnormal posture: Secondary | ICD-10-CM

## 2019-01-12 NOTE — Therapy (Signed)
[facility] [address] [place], [place], [address] Phone: [phone]   Fax:  [phone]  Occupational Therapy Treatment  Patient Details  Name: Rebekah Kennedy MRN: [id number] Date of Birth: [birth year] Referring Provider (OT): Dr. Billey Gosling   Encounter Date: 01/12/2019  OT End of Session - 01/12/19 1820    Visit Number  7    Number of Visits  16    Date for OT Re-Evaluation  02/04/19    Authorization Type  BCBS [place]    OT Start Time  1420   pt arrived late   OT Stop Time  1500    OT Time Calculation (min)  40 min    Activity Tolerance  Patient tolerated treatment well       Past Medical History:  Diagnosis Date  . ASTHMA NOS W/ACUTE EXACERBATION 07/10/2010  . Cervical dysplasia   . DEPRESSION 03/16/2007  . DIABETES MELLITUS, TYPE II 12/01/2006  . Dysmenorrhea   . Fibroid   . HYPERLIPIDEMIA 12/01/2006  . HYPERTENSION 07/07/2006  . Neuropathy   . Obesity   . Osteopenia 05/2017   T score -1.5 FRAX 2.6% / 0.1%  . Retinal edema    gets steriod inj in the eyes    . Sleep apnea    HAS CPAP BUT ADMITS DOES NOT USE JUDICIALLY   . Splenomegaly    in college  . Stroke [facility]) 2012   R pontine, mild residual left hemiparesis  . TIA (transient ischemic attack) 2012   2 TIAs 1 week apart of each other     Past Surgical History:  Procedure Laterality Date  . 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 APPENDECTOMY    . BIOPSY THYROID  05/02/11   Nonneoplastic goiter  . BREAST BIOPSY    . BREAST LUMPECTOMY WITH RADIOACTIVE SEED LOCALIZATION Left 07/25/2017   Procedure: LEFT BREAST LUMPECTOMY WITH RADIOACTIVE SEED LOCALIZATION;  Surgeon: Coralie Keens, MD;  Location: [facility];  Service: General;  Laterality: Left;  . BREAST SURGERY     Reduction  . COLPOSCOPY    . DILATION AND CURETTAGE OF UTERUS  1975   DUB  . EYE SURGERY     Laser  . GASTRIC ROUX-EN-Y N/A 08/12/2017   Procedure: LAPAROSCOPIC ROUX-EN-Y GASTRIC  BYPASS WITH HIATAL HERNIA REPAIR AND UPPER ENDOSCOPY;  Surgeon: [name], [name], MD;  Location: [facility];  Service: General;  Laterality: N/A;  . GYNECOLOGIC CRYOSURGERY    . MYOMECTOMY    . OVARIAN CYST REMOVAL    . PELVIC LAPAROSCOPY  75,88   DL lysis of adhesions    There were no vitals filed for this visit.  Subjective Assessment - 01/12/19 1818    Subjective   I know I need to work on my balance    Pertinent History  Pt with L thalamic stroke in Feb 2020 as well as R pontine stroke in 2012.  HTN, Diabetes, HLD, depression.    Patient Stated Goals  My R hand sometimes does its own thing and I want to be able to write better.    Currently in Pain?  No/denies          Patient seen for aquatic therapy today.  Treatment took place in water 2.5-4 feet deep depending upon activity.  Pt entered the pool via steps using 2 hand rails and close supervision. Pt also needs cues for postural alignment when descending stairs.  Treatment initially focused on dynamic standing balance and  functional mobility with light UE support on small dumb bells length of pool x2. Pt with occasional LOB however able to recover with just cues and close supervision. Pt needs cues to increase step length and to focus on more anterior weight shift (vs lateral) with functional ambulation.  Also addressed dynamic standing balance using walking backwards with light UE support - pt's performance improves with use of gaze fixation and cues for increased step length as well as facilitation of faster gait speed.  Addressed LE strengthening using light resistance band for hip extension as well as hip abduction (15 reps x2 each).  Utilized small noodle for resistance with marching single leg and bilateral UE support on pool wall. Pt needed cues to control both descending noodle as well as noodle ascending.  Alternated LE to challenge RLE with sustained single leg stance with UE support.  Also addressed hip ER/IR with hip and knee  flexion using small noodle for resistance. Addressed core and UE strength using modified push up on pool stairs with moderate assistance to stabilize LE"s and trunk.  Pt exited the pool via steps using 2 hand rails and distant supervision. Pt demonstrated improved stability with functional ambulation on land at end of session.                      OT Short Term Goals - 01/12/19 1819      OT SHORT TERM GOAL #1   Title  Pt will be mod I for RUE coordination HEP ( 01/07/19 goal date adjusted as pt will be on hold for first two weeks of August due to scheduling)    Status  Achieved      OT SHORT TERM GOAL #2   Title  Pt will demonstrate improved coordination as evidenced by decreasing time on 9 hole peg by at least 4 seconds to assist with fine motor tasks    Baseline  31.93    Status  Achieved   01/07/2019  23.99     OT SHORT TERM GOAL #3   Title  Pt will report greater ease in using RUE to brush teeth    Status  Achieved   Pt reports much greater control when brushing her teeth       OT Long Term Goals - 01/12/19 1819      OT LONG TERM GOAL #1   Title  Pt will be mod I with upgraded HEP for functional use of RUE/balance/activity tolerance - 916/20 ( date adjusted as pt will be on hold first two weeks of August due to scheduling)    Status  On-going      OT LONG TERM GOAL #2   Title  Pt will demonstrate improved RUE coordination as evidenced by decreasing time on 9 hole peg test by at least 6 seconds to assist with fine motor tasks    Baseline  31.93    Status  Achieved   01/07/2019 23.99     OT LONG TERM GOAL #3   Title  Pt will demonstrate ability to use RUE to pick up, drink and set cup down without spilling using RUE.    Status  On-going      OT LONG TERM GOAL #4   Title  Pt will be able to write 3 sentence paragraph legibly, AE prn    Status  On-going      OT LONG TERM GOAL #5   Title  Pt will demonstrate ability to walk outside on  uneven ground mod I for  return to leisure and work related activities    Status  On-going      OT [place] #6   Title  Pt will demonstrate ability to complete simple shopping task mod I    Status  On-going            Plan - 01/12/19 1819    Clinical Impression Statement  Pt continues to make gains toward goals.  Pt demonstrating improved dynamic standing balance during aquatic therapy sessions.    OT Occupational Profile and History  Detailed Assessment- Review of Records and additional review of physical, cognitive, psychosocial history related to current functional performance    Occupational performance deficits (Please refer to evaluation for details):  ADL's;IADL's;Work;Leisure;Social Participation    Body Structure / Function / Physical Skills  ADL;Balance;Coordination;FMC;GMC;IADL;Muscle spasms;Tone;UE functional use    Rehab Potential  Good    Clinical Decision Making  Several treatment options, min-mod task modification necessary    Comorbidities Affecting Occupational Performance:  May have comorbidities impacting occupational performance    Modification or Assistance to Complete Evaluation   Min-Moderate modification of tasks or assist with assess necessary to complete eval    OT Frequency  2x / week    OT Duration  8 weeks    OT Treatment/Interventions  Self-care/ADL training;Aquatic Therapy;DME and/or AE instruction;Neuromuscular education;Therapeutic exercise;Functional Mobility Training;Manual Therapy;Therapeutic activities;Patient/family education;Balance training    Plan  fine motor activities, bilateral functional hand activities, NMR for balance (incorporate activity tolerance), practice wrting for single letters and numbers,aquatic therapy    Consulted and Agree with Plan of Care  Patient       Patient will benefit from skilled therapeutic intervention in order to improve the following deficits and impairments:   Body Structure / Function / Physical Skills: ADL, Balance,  Coordination, FMC, GMC, IADL, Muscle spasms, Tone, UE functional use       Visit Diagnosis: Unsteadiness on feet  Muscle weakness (generalized)  Hemiplegia and hemiparesis following cerebral infarction affecting right dominant side (HCC)  Abnormal posture  Other lack of coordination    Problem List Patient Active Problem List   Diagnosis Date Noted  . Dermatitis of eyelid, contact or allergic 07/31/2018  . Right sided weakness 07/14/2018  . Left thalamic infarction ([place]) 07/12/2018  . Carotid artery disease ([facility]) 07/09/2018  . Numbness and tingling 12/17/2017  . Right foot drop 12/17/2017  . Atherosclerosis of aorta ([facility]) 10/09/2017  . Bariatric surgery status, 07/2017 08/19/2017  . Constipation 08/19/2017  . Hypertensive urgency 05/23/2017  . Greater trochanteric bursitis of both hips 12/19/2016  . OSA (obstructive sleep apnea) 07/26/2016  . Hypersomnia 06/11/2016  . Diabetes ([facility]) 07/19/2015  . History of colonic polyps 12/01/2014  . Morbidly obese ([facility]) 10/05/2013  . Partial nontraumatic tear of right rotator cuff 09/21/2013  . Goiter 12/10/2011  . Asthma 12/10/2011  . 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 History of TIA (transient ischemic attack) 02/13/2011  . PROTEINURIA, MILD 09/15/2009  . Hyperlipidemia 12/01/2006  . Essential hypertension 07/07/2006    Quay Burow, OTR/L 01/12/2019, 6:22 PM  [facility] [address] [place], [place], [address] Phone: [phone]   Fax:  [phone]  Name: WINCIE FIEGL MRN: [id number] Date of Birth: 1956-03-31

## 2019-01-14 ENCOUNTER — Ambulatory Visit: Payer: BC Managed Care – PPO | Admitting: Physical Therapy

## 2019-01-14 ENCOUNTER — Ambulatory Visit: Payer: BC Managed Care – PPO | Admitting: Occupational Therapy

## 2019-01-15 ENCOUNTER — Other Ambulatory Visit: Payer: Self-pay | Admitting: Internal Medicine

## 2019-01-19 ENCOUNTER — Encounter: Payer: Self-pay | Admitting: Physical Therapy

## 2019-01-19 ENCOUNTER — Ambulatory Visit: Payer: BC Managed Care – PPO | Admitting: Occupational Therapy

## 2019-01-19 ENCOUNTER — Encounter: Payer: Self-pay | Admitting: Occupational Therapy

## 2019-01-19 ENCOUNTER — Other Ambulatory Visit: Payer: Self-pay

## 2019-01-19 ENCOUNTER — Encounter: Payer: Self-pay | Admitting: Gastroenterology

## 2019-01-19 ENCOUNTER — Ambulatory Visit: Payer: BC Managed Care – PPO | Admitting: Physical Therapy

## 2019-01-19 ENCOUNTER — Other Ambulatory Visit: Payer: Self-pay | Admitting: Internal Medicine

## 2019-01-19 ENCOUNTER — Encounter: Payer: BC Managed Care – PPO | Admitting: Occupational Therapy

## 2019-01-19 DIAGNOSIS — R000 Tachycardia, unspecified: Secondary | ICD-10-CM

## 2019-01-19 DIAGNOSIS — I69351 Hemiplegia and hemiparesis following cerebral infarction affecting right dominant side: Secondary | ICD-10-CM

## 2019-01-19 DIAGNOSIS — M6281 Muscle weakness (generalized): Secondary | ICD-10-CM

## 2019-01-19 DIAGNOSIS — R278 Other lack of coordination: Secondary | ICD-10-CM

## 2019-01-19 DIAGNOSIS — R2689 Other abnormalities of gait and mobility: Secondary | ICD-10-CM

## 2019-01-19 DIAGNOSIS — R2681 Unsteadiness on feet: Secondary | ICD-10-CM

## 2019-01-19 DIAGNOSIS — I10 Essential (primary) hypertension: Secondary | ICD-10-CM

## 2019-01-19 DIAGNOSIS — R293 Abnormal posture: Secondary | ICD-10-CM

## 2019-01-19 NOTE — Therapy (Signed)
[facility] [address] [place], [place], [address] Phone: [phone]   Fax:  [phone]  Physical Therapy Treatment  Patient Details  Name: Rebekah Kennedy MRN: [id number] Date of Birth: 08/05/1955 Referring Provider (PT): Stacy Burns, MD   Encounter Date: 01/19/2019  PT End of Session - 01/19/19 1100    Visit Number  12    Number of Visits  17    Date for PT Re-Evaluation  01/30/19    Authorization Type  BCBS    PT Start Time  1018    PT Stop Time  1058    PT Time Calculation (min)  40 min    Equipment Utilized During Treatment  Gait belt    Activity Tolerance  Patient tolerated treatment well    Behavior During Therapy  WFL for tasks assessed/performed       Past Medical History:  Diagnosis Date  . ASTHMA NOS W/ACUTE EXACERBATION 07/10/2010  . Cervical dysplasia   . DEPRESSION 03/16/2007  . DIABETES MELLITUS, TYPE II 12/01/2006  . Dysmenorrhea   . Fibroid   . HYPERLIPIDEMIA 12/01/2006  . HYPERTENSION 07/07/2006  . Neuropathy   . Obesity   . Osteopenia 05/2017   T score -1.5 FRAX 2.6% / 0.1%  . Retinal edema    gets steriod inj in the eyes    . Sleep apnea    HAS CPAP BUT ADMITS DOES NOT USE JUDICIALLY   . Splenomegaly    in college  . Stroke (HCC) 2012   R pontine, mild residual left hemiparesis  . TIA (transient ischemic attack) 2012   2 TIAs 1 week apart of each other     Past Surgical History:  Procedure Laterality Date  . 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 APPENDECTOMY    . BIOPSY THYROID  05/02/11   Nonneoplastic goiter  . BREAST BIOPSY    . BREAST LUMPECTOMY WITH RADIOACTIVE SEED LOCALIZATION Left 07/25/2017   Procedure: LEFT BREAST LUMPECTOMY WITH RADIOACTIVE SEED LOCALIZATION;  Surgeon: Blackman, Douglas, MD;  Location: [facility];  Service: General;  Laterality: Left;  . BREAST SURGERY     Reduction  . COLPOSCOPY    . DILATION AND CURETTAGE OF UTERUS  1975   DUB  . EYE SURGERY     Laser  . GASTRIC ROUX-EN-Y N/A 08/12/2017   Procedure: LAPAROSCOPIC ROUX-EN-Y GASTRIC BYPASS WITH HIATAL HERNIA REPAIR AND UPPER ENDOSCOPY;  Surgeon: [name], Luke Aaron, MD;  Location[facility];  Service: General;  Laterality: N/A;  . GYNECOLOGIC CRYOSURGERY    . MYOMECTOMY    . OVARIAN CYST REMOVAL    . PELVIC LAPAROSCOPY  75,88   DL lysis of adhesions    There were no vitals filed for this visit.  Subjective Assessment - 01/19/19 1024    Subjective  Nothing new to report.    Pertinent History  L thalamic stroke (06/2018), R pontine stroke (2012), HTN, Uncontrolled diabetes    Limitations  Walking;Writing    Patient Stated Goals  Wants to improve her balance and get back to walking at the [employer] (when safely able to)    Currently in Pain?  No/denies                       [facility] Adult PT Treatment/Exercise - 01/19/19 0001      Ambulation/Gait   Ambulation/Gait  Yes    Ambulation/Gait Assistance  4: Min guard    Ambulation/Gait Assistance Details    dynamic gait without cane with changes in speed, direction, head turns                                              Ambulation Distance (Feet)  400 Feet    Assistive device  None    Gait Pattern  Step-through pattern;Narrow base of support;Lateral hip instability;Decreased dorsiflexion - right    Ambulation Surface  Level;Indoor    Ramp  Other (comment)   min guard for balance   Curb  Other (comment)   min guard for balance     Knee/Hip Exercises: Aerobic   Nustep  Level 5, 8 min, all extremities          Balance Exercises - 01/19/19 1215      Balance Exercises: Standing   Stepping Strategy  Anterior;Posterior;Other reps (comment)   one foot on balance beam, working on steping over and backwards with cues for initial heel strike and weight shifting.   Gait with Head Turns  Forward    Retro Gait  4 reps    Sidestepping  4 reps    Step Over Hurdles / Cones  alt tapping cones working on SLS and then walking around  cones wiht no AD, min guard.    Other Standing Exercises  picking up objects from floor with multilevel reaching without AD, close supervision.        PT Education - 01/19/19 1223    Education Details  Importance of cardiovascular exercise.    Person(s) Educated  Patient    Methods  Explanation    Comprehension  Verbalized understanding       PT Short Term Goals - 12/31/18 1048      PT SHORT TERM GOAL #1   Title  =LTGs        PT Long Term Goals - 01/07/19 1101      PT LONG TERM GOAL #1   Title  Patient will demonstrate proper and safe gait mechanics using SPC with no verbal cues from therapist in order to increase safety in the community.  (LTG due by 01/30/19)    Baseline  pt continues to initially require minimal verbal cues from therapist for sequencing - with good carryover throughout session    Time  4    Period  Weeks    Status  Partially Met      PT LONG TERM GOAL #2   Title  Patient will increase BERG score to at least 52/56 in order to decrease risk of falls.    Baseline  48/56 on 12/30/18    Time  4    Period  Weeks    Status  On-going      PT LONG TERM GOAL #3   Title  Patient will ascend/descend 4 steps with LRAD using a step over step pattern and mod I with one railing in order for patient to safely enter and exit her home.    Baseline  4 with 1 rail with alternating sequence and supervision/min guard    Time  4    Period  Weeks    Status  On-going      PT LONG TERM GOAL #4   Title  Patient will increase gait speed to at least 2.8 ft/sec with cane in order to help decrease her risk of falls and improve community mobility.    Baseline  2.44 ft/sec on 12/30/18, with correct gait sequencing    Time  4    Period  Weeks    Status  On-going      PT LONG TERM GOAL #5   Title  Patient will decrease 5 times sit <> stand time to at least 17 seconds from chair without UE support in order to improve functional LE strength for transfers.    Baseline  25.78 seconds  on 12/30/18 with no UE support from chair/  17.63 seconds on 01/07/19.    Time  4    Period  Weeks    Status  On-going            Plan - 01/19/19 1034    Clinical Impression Statement  Skilled session focused on warmup/strengthening on Nustep, dynamic gait, and dynamic standing balance.  Pt requires min guard for dynamic walking and balance activities without AD, at times coming close to scissoring gait and decreased balance reactions.    Personal Factors and Comorbidities  Comorbidity 1;Profession;Past/Current Experience;Behavior Pattern;Comorbidity 2;Comorbidity 3+    Comorbidities  Hypertension, diabetes, obesity, asthma, hx of CVA, carotid artery disease    Examination-Participation Restrictions  Community Activity    PT Treatment/Interventions  Gait training;Stair training;Functional mobility training;Therapeutic activities;Therapeutic exercise;Balance training;Patient/family education;Neuromuscular re-education    PT Next Visit Plan  Continue SLS training, balance on compliant surfaces, narrow BOS balance training, working on hip abduction and LE strengthening, dual tasks gait with environmental scanning.    PT Home Exercise Plan  [id number]    Consulted and Agree with Plan of Care  Patient       Patient will benefit from skilled therapeutic intervention in order to improve the following deficits and impairments:  Abnormal gait, Decreased balance, Decreased mobility, Decreased coordination, Decreased safety awareness, Decreased strength, Difficulty walking, Decreased activity tolerance  Visit Diagnosis: Muscle weakness (generalized)  Unsteadiness on feet  Other abnormalities of gait and mobility     Problem List Patient Active Problem List   Diagnosis Date Noted  . Dermatitis of eyelid, contact or allergic 07/31/2018  . Right sided weakness 07/14/2018  . Left thalamic infarction (HCC) 07/12/2018  . Carotid artery disease (HCC) 07/09/2018  . Numbness and tingling  12/17/2017  . Right foot drop 12/17/2017  . Atherosclerosis of aorta (HCC) 10/09/2017  . Bariatric surgery status, 07/2017 08/19/2017  . Constipation 08/19/2017  . Hypertensive urgency 05/23/2017  . Greater trochanteric bursitis of both hips 12/19/2016  . OSA (obstructive sleep apnea) 07/26/2016  . Hypersomnia 06/11/2016  . Diabetes (HCC) 07/19/2015  . History of colonic polyps 12/01/2014  . Morbidly obese (HCC) 10/05/2013  . Partial nontraumatic tear of right rotator cuff 09/21/2013  . Goiter 12/10/2011  . Asthma 12/10/2011  . 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 History of TIA (transient ischemic attack) 02/13/2011  . PROTEINURIA, MILD 09/15/2009  . Hyperlipidemia 12/01/2006  . Essential hypertension 07/07/2006   Karissa Hicks, PTA  01/19/19, 12:25 PM [facility] [address] [place], [place], [address] Phone: [phone]   Fax:  [phone]  Name: Rebekah Kennedy MRN: [id number] Date of Birth: 08/11/1955   

## 2019-01-19 NOTE — Therapy (Signed)
[facility] [address] [place], [place], [address] Phone: [phone]   Fax:  [phone]  Occupational Therapy Treatment  Patient Details  Name: Rebekah Kennedy MRN: [id number] Date of Birth: 12-06-1955 Referring Provider (OT): Dr. Billey Gosling   Encounter Date: 01/19/2019  OT End of Session - 01/19/19 1814    Visit Number  8    Number of Visits  16    Date for OT Re-Evaluation  02/04/19    Authorization Type  [employer] [place]    OT Start Time  1425   pt arrived late   OT Stop Time  1500    OT Time Calculation (min)  35 min    Activity Tolerance  Patient tolerated treatment well       Past Medical History:  Diagnosis Date  . ASTHMA NOS W/ACUTE EXACERBATION 07/10/2010  . Cervical dysplasia   . DEPRESSION 03/16/2007  . DIABETES MELLITUS, TYPE II 12/01/2006  . Dysmenorrhea   . Fibroid   . HYPERLIPIDEMIA 12/01/2006  . HYPERTENSION 07/07/2006  . Neuropathy   . Obesity   . Osteopenia 05/2017   T score -1.5 FRAX 2.6% / 0.1%  . Retinal edema    gets steriod inj in the eyes    . Sleep apnea    HAS CPAP BUT ADMITS DOES NOT USE JUDICIALLY   . Splenomegaly    in college  . Stroke [facility]) 2012   R pontine, mild residual left hemiparesis  . TIA (transient ischemic attack) 2012   2 TIAs 1 week apart of each other     Past Surgical History:  Procedure Laterality Date  . 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 APPENDECTOMY    . BIOPSY THYROID  05/02/11   Nonneoplastic goiter  . BREAST BIOPSY    . BREAST LUMPECTOMY WITH RADIOACTIVE SEED LOCALIZATION Left 07/25/2017   Procedure: LEFT BREAST LUMPECTOMY WITH RADIOACTIVE SEED LOCALIZATION;  Surgeon: Coralie Keens, MD;  Location: [facility] [facility];  Service: General;  Laterality: Left;  . BREAST SURGERY     Reduction  . COLPOSCOPY    . DILATION AND CURETTAGE OF UTERUS  1975   DUB  . EYE SURGERY     Laser  . GASTRIC ROUX-EN-Y N/A 08/12/2017   Procedure: LAPAROSCOPIC ROUX-EN-Y GASTRIC  BYPASS WITH HIATAL HERNIA REPAIR AND UPPER ENDOSCOPY;  Surgeon: [name], [name], MD;  Location: [facility];  Service: General;  Laterality: N/A;  . GYNECOLOGIC CRYOSURGERY    . MYOMECTOMY    . OVARIAN CYST REMOVAL    . PELVIC LAPAROSCOPY  75,88   DL lysis of adhesions    There were no vitals filed for this visit.  Subjective Assessment - 01/19/19 1810    Subjective   I feel safer working on my balance in the water    Pertinent History  Pt with L thalamic stroke in Feb 2020 as well as R pontine stroke in 2012.  HTN, Diabetes, HLD, depression.    Patient Stated Goals  My R hand sometimes does its own thing and I want to be able to write better.    Currently in Pain?  No/denies          Patient seen for aquatic therapy today.  Treatment took place in water 2.5-4 feet deep depending upon activity.  Pt entered the pool via stairs using 2 hand rails and distant supervision.  Treatment focused on functional ambulation using light dumb bells for UE support and emphasis on gait speed  and increasing step length - pt needs max cues to incorporate.  Also addressed RLE strengthening for hip ab/adduction as well as hip extension using flotation device on R ankle - activities took place in deeper water with increased speed to increase resistance.  15 reps x2 for all exercises.  Addressed dynamic standing balance by having pt stand on big noodle first with bilateral UE support, then single UE support and then no UE support. Pt only able to maintain balance for 2-3 seconds without UE support. Also addressed dynamic standing balance and LE strengthening into extension with backward walking while wearing flotation device on RLE (ankle) for resistance. Pt needed min a and cues to use gaze fixation to assist with balance. Pt exited the pool via steps using 2 hand rails and distant supervision.                      OT Short Term Goals - 01/19/19 1811      OT SHORT TERM GOAL #1   Title  Pt  will be mod I for RUE coordination HEP ( 01/07/19 goal date adjusted as pt will be on hold for first two weeks of August due to scheduling)    Status  Achieved      OT SHORT TERM GOAL #2   Title  Pt will demonstrate improved coordination as evidenced by decreasing time on 9 hole peg by at least 4 seconds to assist with fine motor tasks    Baseline  31.93    Status  Achieved   01/07/2019  23.99     OT SHORT TERM GOAL #3   Title  Pt will report greater ease in using RUE to brush teeth    Status  Achieved   Pt reports much greater control when brushing her teeth       OT Long Term Goals - 01/19/19 1811      OT LONG TERM GOAL #1   Title  Pt will be mod I with upgraded HEP for functional use of RUE/balance/activity tolerance - 02/04/19 ( date adjusted as pt will be on hold first two weeks of August due to scheduling)    Status  On-going      OT LONG TERM GOAL #2   Title  Pt will demonstrate improved RUE coordination as evidenced by decreasing time on 9 hole peg test by at least 6 seconds to assist with fine motor tasks    Baseline  31.93    Status  Achieved   01/07/2019 23.99     OT LONG TERM GOAL #3   Title  Pt will demonstrate ability to use RUE to pick up, drink and set cup down without spilling using RUE.    Status  On-going      OT LONG TERM GOAL #4   Title  Pt will be able to write 3 sentence paragraph legibly, AE prn    Status  On-going      OT LONG TERM GOAL #5   Title  Pt will demonstrate ability to walk outside on uneven ground mod I for return to leisure and work related activities    Status  On-going      OT [place] #6   Title  Pt will demonstrate ability to complete simple shopping task mod I    Status  On-going            Plan - 01/19/19 1813    Clinical Impression Statement  Pt continues  to progress toward goals. Pt is able to tolerate more challenge to dynamic standing balance during aquatic therapy.    OT Occupational Profile and History  Detailed  Assessment- Review of Records and additional review of physical, cognitive, psychosocial history related to current functional performance    Occupational performance deficits (Please refer to evaluation for details):  ADL's;IADL's;Work;Leisure;Social Participation    Body Structure / Function / Physical Skills  ADL;Balance;Coordination;FMC;GMC;IADL;Muscle spasms;Tone;UE functional use    Rehab Potential  Good    Clinical Decision Making  Several treatment options, min-mod task modification necessary    Comorbidities Affecting Occupational Performance:  May have comorbidities impacting occupational performance    OT Frequency  2x / week    OT Duration  8 weeks    OT Treatment/Interventions  Self-care/ADL training;Aquatic Therapy;DME and/or AE instruction;Neuromuscular education;Therapeutic exercise;Functional Mobility Training;Manual Therapy;Therapeutic activities;Patient/family education;Balance training    Plan  fine motor activities, bilateral functional hand activities, NMR for balance (incorporate activity tolerance), practice wrting for single letters and numbers,aquatic therapy       Patient will benefit from skilled therapeutic intervention in order to improve the following deficits and impairments:   Body Structure / Function / Physical Skills: ADL, Balance, Coordination, FMC, GMC, IADL, Muscle spasms, Tone, UE functional use       Visit Diagnosis: Muscle weakness (generalized)  Unsteadiness on feet  Hemiplegia and hemiparesis following cerebral infarction affecting right dominant side (HCC)  Abnormal posture  Other lack of coordination    Problem List Patient Active Problem List   Diagnosis Date Noted  . Dermatitis of eyelid, contact or allergic 07/31/2018  . Right sided weakness 07/14/2018  . Left thalamic infarction ([place]) 07/12/2018  . Carotid artery disease ([place]) 07/09/2018  . Numbness and tingling 12/17/2017  . Right foot drop 12/17/2017  . Atherosclerosis of  aorta ([place]) 10/09/2017  . Bariatric surgery status, 07/2017 08/19/2017  . Constipation 08/19/2017  . Hypertensive urgency 05/23/2017  . Greater trochanteric bursitis of both hips 12/19/2016  . OSA (obstructive sleep apnea) 07/26/2016  . Hypersomnia 06/11/2016  . Diabetes ([place]) 07/19/2015  . History of colonic polyps 12/01/2014  . Morbidly obese ([place]) 10/05/2013  . Partial nontraumatic tear of right rotator cuff 09/21/2013  . Goiter 12/10/2011  . Asthma 12/10/2011  . 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 History of TIA (transient ischemic attack) 02/13/2011  . PROTEINURIA, MILD 09/15/2009  . Hyperlipidemia 12/01/2006  . Essential hypertension 07/07/2006    Quay Burow, OTR/L 01/19/2019, 6:16 PM  [facility] [address] [place], [place], [address] Phone: [phone]   Fax:  [phone]  Name: Rebekah Kennedy MRN: [id number] Date of Birth: 30-Nov-1955

## 2019-01-21 ENCOUNTER — Other Ambulatory Visit: Payer: Self-pay

## 2019-01-21 ENCOUNTER — Encounter: Payer: Self-pay | Admitting: Occupational Therapy

## 2019-01-21 ENCOUNTER — Ambulatory Visit: Payer: BC Managed Care – PPO | Admitting: Rehabilitative and Restorative Service Providers"

## 2019-01-21 ENCOUNTER — Ambulatory Visit: Payer: BC Managed Care – PPO | Attending: Internal Medicine | Admitting: Occupational Therapy

## 2019-01-21 ENCOUNTER — Encounter: Payer: Self-pay | Admitting: Rehabilitative and Restorative Service Providers"

## 2019-01-21 DIAGNOSIS — I69351 Hemiplegia and hemiparesis following cerebral infarction affecting right dominant side: Secondary | ICD-10-CM

## 2019-01-21 DIAGNOSIS — R278 Other lack of coordination: Secondary | ICD-10-CM | POA: Diagnosis present

## 2019-01-21 DIAGNOSIS — R293 Abnormal posture: Secondary | ICD-10-CM | POA: Diagnosis present

## 2019-01-21 DIAGNOSIS — M6281 Muscle weakness (generalized): Secondary | ICD-10-CM | POA: Insufficient documentation

## 2019-01-21 DIAGNOSIS — R2681 Unsteadiness on feet: Secondary | ICD-10-CM

## 2019-01-21 DIAGNOSIS — R2689 Other abnormalities of gait and mobility: Secondary | ICD-10-CM | POA: Diagnosis present

## 2019-01-21 NOTE — Therapy (Signed)
[facility] [address] [place], [place], [address] Phone: [phone]   Fax:  [phone]  Occupational Therapy Treatment  Patient Details  Name: Rebekah Kennedy MRN: [id number] Date of Birth: Jul 10, 1955 Referring Provider (OT): Dr. Billey Gosling   Encounter Date: 01/21/2019  OT End of Session - 01/21/19 1156    Visit Number  9    Number of Visits  16    Date for OT Re-Evaluation  02/04/19    Authorization Type  [employer] [place]    OT Start Time  1101    OT Stop Time  1145    OT Time Calculation (min)  44 min    Activity Tolerance  Patient tolerated treatment well       Past Medical History:  Diagnosis Date  . ASTHMA NOS W/ACUTE EXACERBATION 07/10/2010  . Cervical dysplasia   . DEPRESSION 03/16/2007  . DIABETES MELLITUS, TYPE II 12/01/2006  . Dysmenorrhea   . Fibroid   . HYPERLIPIDEMIA 12/01/2006  . HYPERTENSION 07/07/2006  . Neuropathy   . Obesity   . Osteopenia 05/2017   T score -1.5 FRAX 2.6% / 0.1%  . Retinal edema    gets steriod inj in the eyes    . Sleep apnea    HAS CPAP BUT ADMITS DOES NOT USE JUDICIALLY   . Splenomegaly    in college  . Stroke [facility]) 2012   R pontine, mild residual left hemiparesis  . TIA (transient ischemic attack) 2012   2 TIAs 1 week apart of each other     Past Surgical History:  Procedure Laterality Date  . 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 APPENDECTOMY    . BIOPSY THYROID  05/02/11   Nonneoplastic goiter  . BREAST BIOPSY    . BREAST LUMPECTOMY WITH RADIOACTIVE SEED LOCALIZATION Left 07/25/2017   Procedure: LEFT BREAST LUMPECTOMY WITH RADIOACTIVE SEED LOCALIZATION;  Surgeon: Coralie Keens, MD;  Location: [facility];  Service: General;  Laterality: Left;  . BREAST SURGERY     Reduction  . COLPOSCOPY    . DILATION AND CURETTAGE OF UTERUS  1975   DUB  . EYE SURGERY     Laser  . GASTRIC ROUX-EN-Y N/A 08/12/2017   Procedure: LAPAROSCOPIC ROUX-EN-Y GASTRIC BYPASS WITH HIATAL  HERNIA REPAIR AND UPPER ENDOSCOPY;  Surgeon: [name], [name], MD;  [facility];  Service: General;  Laterality: N/A;  . GYNECOLOGIC CRYOSURGERY    . MYOMECTOMY    . OVARIAN CYST REMOVAL    . PELVIC LAPAROSCOPY  75,88   DL lysis of adhesions    There were no vitals filed for this visit.  Subjective Assessment - 01/21/19 1107    Pertinent History  Pt with L thalamic stroke in Feb 2020 as well as R pontine stroke in 2012.  HTN, Diabetes, HLD, depression.    Patient Stated Goals  My R hand sometimes does its own thing and I want to be able to write better.    Currently in Pain?  No/denies                   OT Treatments/Exercises (OP) - 01/21/19 0001      ADLs   Writing  Assessed handwriting goal with pt - pt today able to write full paragraph ( 5 sentences) with 100% legiblity using coban wrap on pen.  Pt reported that she did "a fair amount of paperwork at home last night and I could acutally read it  all!"        Neurological Re-education Exercises   Other Exercises 1  Neuro re ed to address dynamic standing balance, automatic postural reactions and functional ambulation on outdoor uneven surfaces.  Pt with occassional LOB especially with transition from grass to sidewalk, sidewalk to grass and curbs however able to maintain balnace with close supervision.  Also addressed increasing trunk control, alignment and strength using therapy ball as well as activities in supine with mild resistance.                 OT Short Term Goals - 01/21/19 1107      OT SHORT TERM GOAL #1   Title  Pt will be mod I for RUE coordination HEP ( 01/07/19 goal date adjusted as pt will be on hold for first two weeks of August due to scheduling)    Status  Achieved      OT SHORT TERM GOAL #2   Title  Pt will demonstrate improved coordination as evidenced by decreasing time on 9 hole peg by at least 4 seconds to assist with fine motor tasks    Baseline  31.93    Status  Achieved    01/07/2019  23.99     OT SHORT TERM GOAL #3   Title  Pt will report greater ease in using RUE to brush teeth    Status  Achieved   Pt reports much greater control when brushing her teeth       OT Long Term Goals - 01/21/19 1107      OT LONG TERM GOAL #1   Title  Pt will be mod I with upgraded HEP for functional use of RUE/balance/activity tolerance - 02/04/19 ( date adjusted as pt will be on hold first two weeks of August due to scheduling)    Status  On-going      OT LONG TERM GOAL #2   Title  Pt will demonstrate improved RUE coordination as evidenced by decreasing time on 9 hole peg test by at least 6 seconds to assist with fine motor tasks    Baseline  31.93    Status  Achieved   01/07/2019 23.99     OT LONG TERM GOAL #3   Title  Pt will demonstrate ability to use RUE to pick up, drink and set cup down without spilling using RUE.    Status  On-going      OT LONG TERM GOAL #4   Title  Pt will be able to write 3 sentence paragraph legibly, AE prn    Status  Achieved      OT LONG TERM GOAL #5   Title  Pt will demonstrate ability to walk outside on uneven ground mod I for return to leisure and work related activities    Status  On-going      OT LONG TERM GOAL #6   Title  Pt will demonstrate ability to complete simple shopping task mod I    Status  On-going            Plan - 01/21/19 1155    Clinical Impression Statement  Pt with slowly improving dynamic standing balance with land based activities.    OT Occupational Profile and History  Detailed Assessment- Review of Records and additional review of physical, cognitive, psychosocial history related to current functional performance    Occupational performance deficits (Please refer to evaluation for details):  ADL's;IADL's;Work;Leisure;Social Participation    Body Structure / Function / Physical Skills  ADL;Balance;Coordination;FMC;GMC;IADL;Muscle spasms;Tone;UE functional use    Rehab Potential  Good    Clinical  Decision Making  Several treatment options, min-mod task modification necessary    Comorbidities Affecting Occupational Performance:  May have comorbidities impacting occupational performance    Modification or Assistance to Complete Evaluation   Min-Moderate modification of tasks or assist with assess necessary to complete eval    OT Frequency  2x / week    OT Duration  8 weeks    OT Treatment/Interventions  Self-care/ADL training;Aquatic Therapy;DME and/or AE instruction;Neuromuscular education;Therapeutic exercise;Functional Mobility Training;Manual Therapy;Therapeutic activities;Patient/family education;Balance training    Plan  aquatic therapy 1x/wk to address dynamic standing balance, postural control, trunk control, LE strengthening and functional ambulation.    Consulted and Agree with Plan of Care  Patient       Patient will benefit from skilled therapeutic intervention in order to improve the following deficits and impairments:   Body Structure / Function / Physical Skills: ADL, Balance, Coordination, FMC, GMC, IADL, Muscle spasms, Tone, UE functional use       Visit Diagnosis: Muscle weakness (generalized)  Unsteadiness on feet  Hemiplegia and hemiparesis following cerebral infarction affecting right dominant side (HCC)  Abnormal posture  Other lack of coordination    Problem List Patient Active Problem List   Diagnosis Date Noted  . Dermatitis of eyelid, contact or allergic 07/31/2018  . Right sided weakness 07/14/2018  . Left thalamic infarction ([place]) 07/12/2018  . Carotid artery disease ([place]) 07/09/2018  . Numbness and tingling 12/17/2017  . Right foot drop 12/17/2017  . Atherosclerosis of aorta ([place]) 10/09/2017  . Bariatric surgery status, 07/2017 08/19/2017  . Constipation 08/19/2017  . Hypertensive urgency 05/23/2017  . Greater trochanteric bursitis of both hips 12/19/2016  . OSA (obstructive sleep apnea) 07/26/2016  . Hypersomnia 06/11/2016  . Diabetes  ([place]) 07/19/2015  . History of colonic polyps 12/01/2014  . Morbidly obese ([place]) 10/05/2013  . Partial nontraumatic tear of right rotator cuff 09/21/2013  . Goiter 12/10/2011  . Asthma 12/10/2011  . 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 History of TIA (transient ischemic attack) 02/13/2011  . PROTEINURIA, MILD 09/15/2009  . Hyperlipidemia 12/01/2006  . Essential hypertension 07/07/2006    Quay Burow, OTR/L 01/21/2019, 11:58 AM  [facility] [address] [place], [place], [address] Phone: [phone]   Fax:  [phone]  Name: Rebekah Kennedy MRN: [id number] Date of Birth: 1955/11/27

## 2019-01-21 NOTE — Therapy (Signed)
[facility] [address] [place], [place], [address] Phone: [phone]   Fax:  [phone]  Physical Therapy Treatment  Patient Details  Name: Rebekah Kennedy MRN: [id number] Date of Birth: 18-Jan-1956 Referring Provider (PT): Billey Gosling, MD   Encounter Date: 01/21/2019  PT End of Session - 01/21/19 1059    Visit Number  13    Number of Visits  17    Date for PT Re-Evaluation  01/30/19    Authorization Type  BCBS    PT Start Time  1020    PT Stop Time  1102    PT Time Calculation (min)  42 min    Equipment Utilized During Treatment  Gait belt    Activity Tolerance  Patient tolerated treatment well    Behavior During Therapy  WFL for tasks assessed/performed       Past Medical History:  Diagnosis Date  . ASTHMA NOS W/ACUTE EXACERBATION 07/10/2010  . Cervical dysplasia   . DEPRESSION 03/16/2007  . DIABETES MELLITUS, TYPE II 12/01/2006  . Dysmenorrhea   . Fibroid   . HYPERLIPIDEMIA 12/01/2006  . HYPERTENSION 07/07/2006  . Neuropathy   . Obesity   . Osteopenia 05/2017   T score -1.5 FRAX 2.6% / 0.1%  . Retinal edema    gets steriod inj in the eyes    . Sleep apnea    HAS CPAP BUT ADMITS DOES NOT USE JUDICIALLY   . Splenomegaly    in college  . Stroke [facility]) 2012   R pontine, mild residual left hemiparesis  . TIA (transient ischemic attack) 2012   2 TIAs 1 week apart of each other     Past Surgical History:  Procedure Laterality Date  . 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 APPENDECTOMY    . BIOPSY THYROID  05/02/11   Nonneoplastic goiter  . BREAST BIOPSY    . BREAST LUMPECTOMY WITH RADIOACTIVE SEED LOCALIZATION Left 07/25/2017   Procedure: LEFT BREAST LUMPECTOMY WITH RADIOACTIVE SEED LOCALIZATION;  Surgeon: Coralie Keens, MD;  Location: [facility] [facility];  Service: General;  Laterality: Left;  . BREAST SURGERY     Reduction  . COLPOSCOPY    . DILATION AND CURETTAGE OF UTERUS  1975   DUB  . EYE SURGERY     Laser  . GASTRIC ROUX-EN-Y N/A 08/12/2017   Procedure: LAPAROSCOPIC ROUX-EN-Y GASTRIC BYPASS WITH HIATAL HERNIA REPAIR AND UPPER ENDOSCOPY;  Surgeon: [name], [name], MD;  Location: [facility];  Service: General;  Laterality: N/A;  . GYNECOLOGIC CRYOSURGERY    . MYOMECTOMY    . OVARIAN CYST REMOVAL    . PELVIC LAPAROSCOPY  75,88   DL lysis of adhesions    There were no vitals filed for this visit.  Subjective Assessment - 01/21/19 1024    Subjective  The patient is retiring 02/19/19-- she was waiting until her son finished college to retire.    Pertinent History  L thalamic stroke (06/2018), R pontine stroke (2012), HTN, Uncontrolled diabetes    Patient Stated Goals  Wants to improve her balance and get back to walking at the [facility] (when safely able to)    Currently in Pain?  No/denies                       [facility] Adult PT Treatment/Exercise - 01/21/19 1037      Ambulation/Gait   Ambulation/Gait  Yes    Ambulation/Gait Assistance  4: Min guard;5: Supervision  Ambulation/Gait Assistance Details  Without a device, PT is providing close supervision during level surface gait, with SPC, she can perform mod indep.  With dynamic tasks, CGA provided.    Ambulation Distance (Feet)  500 Feet    Assistive device  None;Straight cane    Gait Pattern  Step-through pattern;Narrow base of support;Lateral hip instability;Decreased dorsiflexion - right    Ambulation Surface  Level;Indoor    Gait Comments  Patient is able to perform walking backwards CGA, wide leg marching, direction changes to sidestepping with gait.      Neuro Re-ed    Neuro Re-ed Details   The patient performed single leg stance activities incorporating strengthening hip stability.  Large weight shift laterally and return to midline, Sidestepping and braiding with CGA.      Exercises   Exercises  Other Exercises;Knee/Hip;Ankle    Other Exercises   Quadriped hip extension x 5 reps alternating LEs with cues on arm  position.      Knee/Hip Exercises: Stretches   Other Knee/Hip Stretches  Standing wide leg hip adductor stretch.      Knee/Hip Exercises: Machines for Strengthening   Other Machine  Nu-step legs only level 5 x 5 minutes for LE strengthening.      Knee/Hip Exercises: Standing   Lateral Step Up  10 reps;Hand Hold: 1    Lateral Step Up Limitations  lateral step up + hip abduction    Forward Step Up  10 reps    Forward Step Up Limitations  dec'ing UE support    Other Standing Knee Exercises  deep squat 10 reps with wider base of support.      Ankle Exercises: Seated   Other Seated Ankle Exercises  resisted green t-band ankle DF x 12 reps and then ankle eversion x 12 reps               PT Short Term Goals - 12/31/18 1048      PT SHORT TERM GOAL #1   Title  =LTGs        PT Long Term Goals - 01/07/19 1101      PT LONG TERM GOAL #1   Title  Patient will demonstrate proper and safe gait mechanics using SPC with no verbal cues from therapist in order to increase safety in the community.  (LTG due by 01/30/19)    Baseline  pt continues to initially require minimal verbal cues from therapist for sequencing - with good carryover throughout session    Time  4    Period  Weeks    Status  Partially Met      PT LONG TERM GOAL #2   Title  Patient will increase BERG score to at least 52/56 in order to decrease risk of falls.    Baseline  48/56 on 12/30/18    Time  4    Period  Weeks    Status  On-going      PT LONG TERM GOAL #3   Title  Patient will ascend/descend 4 steps with LRAD using a step over step pattern and mod I with one railing in order for patient to safely enter and exit her home.    Baseline  4 with 1 rail with alternating sequence and supervision/min guard    Time  4    Period  Weeks    Status  On-going      PT LONG TERM GOAL #4   Title  Patient will increase gait speed to at least  2.8 ft/sec with cane in order to help decrease her risk of falls and improve  community mobility.    Baseline  2.44 ft/sec on 12/30/18, with correct gait sequencing    Time  4    Period  Weeks    Status  On-going      PT LONG TERM GOAL #5   Title  Patient will decrease 5 times sit <> stand time to at least 17 seconds from chair without UE support in order to improve functional LE strength for transfers.    Baseline  25.78 seconds on 12/30/18 with no UE support from chair/  17.63 seconds on 01/07/19.    Time  4    Period  Weeks    Status  On-going            Plan - 01/21/19 1110    Clinical Impression Statement  The patient continues with occasional instability when ambulating with and without device.  She has a narrow BOS.  With wide leg marching, the patient noted pulling in hip adductors.  PT performed stretch today.  Plan to continue working to [place].    PT Treatment/Interventions  Gait training;Stair training;Functional mobility training;Therapeutic activities;Therapeutic exercise;Balance training;Patient/family education;Neuromuscular re-education    PT Next Visit Plan  Work to LTGs beginning to check and assess for d/c.  Continue balance training, work on hip strengthening and hip adductor stretching, dual tasks wiht gait.    Consulted and Agree with Plan of Care  Patient       Patient will benefit from skilled therapeutic intervention in order to improve the following deficits and impairments:  Abnormal gait, Decreased balance, Decreased mobility, Decreased coordination, Decreased safety awareness, Decreased strength, Difficulty walking, Decreased activity tolerance  Visit Diagnosis: Muscle weakness (generalized)  Unsteadiness on feet  Hemiplegia and hemiparesis following cerebral infarction affecting right dominant side [facility])     Problem List Patient Active Problem List   Diagnosis Date Noted  . Dermatitis of eyelid, contact or allergic 07/31/2018  . Right sided weakness 07/14/2018  . Left thalamic infarction ([place]) 07/12/2018  . Carotid artery  disease ([place]) 07/09/2018  . Numbness and tingling 12/17/2017  . Right foot drop 12/17/2017  . Atherosclerosis of aorta ([place]) 10/09/2017  . Bariatric surgery status, 07/2017 08/19/2017  . Constipation 08/19/2017  . Hypertensive urgency 05/23/2017  . Greater trochanteric bursitis of both hips 12/19/2016  . OSA (obstructive sleep apnea) 07/26/2016  . Hypersomnia 06/11/2016  . Diabetes ([place]) 07/19/2015  . History of colonic polyps 12/01/2014  . Morbidly obese ([place]) 10/05/2013  . Partial nontraumatic tear of right rotator cuff 09/21/2013  . Goiter 12/10/2011  . Asthma 12/10/2011  . 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 History of TIA (transient ischemic attack) 02/13/2011  . PROTEINURIA, MILD 09/15/2009  . Hyperlipidemia 12/01/2006  . Essential hypertension 07/07/2006    Yusra Ravert, PT 01/21/2019, 11:13 AM  [facility] [address] [place], [place], [address] Phone: [phone]   Fax:  [phone]  Name: LORIANN BOSSERMAN MRN: [id number] Date of Birth: 21-Nov-1955

## 2019-01-22 ENCOUNTER — Telehealth: Payer: Self-pay

## 2019-01-22 NOTE — Telephone Encounter (Signed)
Covid-19 screening questions   Do you now or have you had a fever in the last 14 days?  Do you have any respiratory symptoms of shortness of breath or cough now or in the last 14 days?  Do you have any family members or close contacts with diagnosed or suspected Covid-19 in the past 14 days?  Have you been tested for Covid-19 and found to be positive?       

## 2019-01-23 ENCOUNTER — Ambulatory Visit (AMBULATORY_SURGERY_CENTER): Payer: BC Managed Care – PPO | Admitting: Gastroenterology

## 2019-01-23 ENCOUNTER — Encounter: Payer: Self-pay | Admitting: Gastroenterology

## 2019-01-23 ENCOUNTER — Other Ambulatory Visit: Payer: Self-pay

## 2019-01-23 VITALS — BP 158/72 | HR 52 | Temp 96.3°F | Resp 11 | Ht 66.0 in | Wt 166.0 lb

## 2019-01-23 DIAGNOSIS — D122 Benign neoplasm of ascending colon: Secondary | ICD-10-CM

## 2019-01-23 DIAGNOSIS — Z1211 Encounter for screening for malignant neoplasm of colon: Secondary | ICD-10-CM

## 2019-01-23 MED ORDER — SODIUM CHLORIDE 0.9 % IV SOLN: 500.0000 mL | Freq: Once | INTRAVENOUS | Status: DC

## 2019-01-23 NOTE — Op Note (Signed)
[place] Patient Name: Rebekah Kennedy Procedure Date: 01/23/2019 9:04 AM MRN: [id number] Endoscopist: Milus Banister , MD Age: 63 Referring MD:  Date of Birth: 30-Jan-1956 Gender: Female Account #: [id number] Procedure:                Colonoscopy Indications:              Screening for colorectal malignant neoplasm Medicines:                Monitored Anesthesia Care Procedure:                Pre-Anesthesia Assessment:                           - Prior to the procedure, a History and Physical                            was performed, and patient medications and                            allergies were reviewed. The patient's tolerance of                            previous anesthesia was also reviewed. The risks                            and benefits of the procedure and the sedation                            options and risks were discussed with the patient.                            All questions were answered, and informed consent                            was obtained. Prior Anticoagulants: The patient has                            taken Plavix (clopidogrel), last dose was 5 days                            prior to procedure. ASA Grade Assessment: III - A                            patient with severe systemic disease. After                            reviewing the risks and benefits, the patient was                            deemed in satisfactory condition to undergo the                            procedure.  After obtaining informed consent, the colonoscope                            was passed under direct vision. Throughout the                            procedure, the patient's blood pressure, pulse, and                            oxygen saturations were monitored continuously. The                            Colonoscope was introduced through the anus and                            advanced to the the cecum, identified by                   appendiceal orifice and ileocecal valve. The                            colonoscopy was performed without difficulty. The                            patient tolerated the procedure well. The quality                            of the bowel preparation was good. The ileocecal                            valve, appendiceal orifice, and rectum were                            photographed. Scope In: 9:08:30 AM Scope Out: 9:22:35 AM Scope Withdrawal Time: 0 hours 9 minutes 54 seconds  Total Procedure Duration: 0 hours 14 minutes 5 seconds  Findings:                 A 9 mm polyp was found in the ascending colon. The                            polyp was pedunculated. The polyp was removed with                            a cold snare. Resection and retrieval were complete.                           The exam was otherwise without abnormality on                            direct and retroflexion views. Complications:            No immediate complications. Estimated blood loss:                            None. Estimated Blood  Loss:     Estimated blood loss: none. Impression:               - One 9 mm polyp in the ascending colon, removed                            with a cold snare. Resected and retrieved.                           - The examination was otherwise normal on direct                            and retroflexion views. Recommendation:           - Patient has a contact number available for                            emergencies. The signs and symptoms of potential                            delayed complications were discussed with the                            patient. Return to normal activities tomorrow.                            Written discharge instructions were provided to the                            patient.                           - Resume previous diet.                           - Continue present medications. OK to resume your                             plavix tomorrow.                           - Repeat colonoscopy is recommended. The                            colonoscopy date will be determined after pathology                            results from today's exam become available for                            review. Likely 7 years. Milus Banister, MD 01/23/2019 9:24:51 AM This report has been signed electronically.

## 2019-01-23 NOTE — Progress Notes (Signed)
Report to PACU, RN, vss, BBS= Clear.  

## 2019-01-23 NOTE — Progress Notes (Signed)
Called to room to assist during endoscopic procedure.  Patient ID and intended procedure confirmed with present staff. Received instructions for my participation in the procedure from the performing physician.  

## 2019-01-23 NOTE — Progress Notes (Signed)
Temp June Vitals courtney

## 2019-01-23 NOTE — Patient Instructions (Signed)
Thank you for allowing [place] to care for you today!  Await pathology results by mail, approximately 2 weeks.  Recommendation for next colonosocpy will be made at that time  Resume previous diet today. Resume Plavix tomorrow.  All other medications may be resumed today.  Return to normal activities tomorrow.    YOU HAD AN ENDOSCOPIC PROCEDURE TODAY AT [facility]:   Refer to the procedure report that was given to you for any specific questions about what was found during the examination.  If the procedure report does not answer your questions, please call your gastroenterologist to clarify.  If you requested that your care partner not be given the details of your procedure findings, then the procedure report has been included in a sealed envelope for you to review at your convenience later.  YOU SHOULD EXPECT: Some feelings of bloating in the abdomen. Passage of more gas than usual.  Walking can help get rid of the air that was put into your GI tract during the procedure and reduce the bloating. If you had a lower endoscopy (such as a colonoscopy or flexible sigmoidoscopy) you may notice spotting of blood in your stool or on the toilet paper. If you underwent a bowel prep for your procedure, you may not have a normal bowel movement for a few days.  Please Note:  You might notice some irritation and congestion in your nose or some drainage.  This is from the oxygen used during your procedure.  There is no need for concern and it should clear up in a day or so.  SYMPTOMS TO REPORT IMMEDIATELY:   Following lower endoscopy (colonoscopy or flexible sigmoidoscopy):  Excessive amounts of blood in the stool  Significant tenderness or worsening of abdominal pains  Swelling of the abdomen that is new, acute  Fever of 100F or higher    For urgent or emergent issues, a gastroenterologist can be reached at any hour by calling [phone].   DIET:  We do recommend a small meal at  first, but then you may proceed to your regular diet.  Drink plenty of fluids but you should avoid alcoholic beverages for 24 hours.  ACTIVITY:  You should plan to take it easy for the rest of today and you should NOT DRIVE or use heavy machinery until tomorrow (because of the sedation medicines used during the test).    FOLLOW UP: Our staff will call the number listed on your records 48-72 hours following your procedure to check on you and address any questions or concerns that you may have regarding the information given to you following your procedure. If we do not reach you, we will leave a message.  We will attempt to reach you two times.  During this call, we will ask if you have developed any symptoms of COVID 19. If you develop any symptoms (ie: fever, flu-like symptoms, shortness of breath, cough etc.) before then, please call [phone].  If you test positive for Covid 19 in the 2 weeks post procedure, please call and report this information to [place].    If any biopsies were taken you will be contacted by phone or by letter within the next 1-3 weeks.  Please call us at [phone] if you have not heard about the biopsies in 3 weeks.    SIGNATURES/CONFIDENTIALITY: You and/or your care partner have signed paperwork which will be entered into your electronic medical record.  These signatures attest to the fact that that the  information above on your After Visit Summary has been reviewed and is understood.  Full responsibility of the confidentiality of this discharge information lies with you and/or your care-partner.

## 2019-01-27 ENCOUNTER — Ambulatory Visit: Payer: BC Managed Care – PPO | Admitting: Physical Therapy

## 2019-01-28 ENCOUNTER — Telehealth: Payer: Self-pay | Admitting: *Deleted

## 2019-01-28 ENCOUNTER — Other Ambulatory Visit: Payer: Self-pay

## 2019-01-28 ENCOUNTER — Ambulatory Visit: Payer: BC Managed Care – PPO | Admitting: Physical Therapy

## 2019-01-28 ENCOUNTER — Encounter: Payer: Self-pay | Admitting: Physical Therapy

## 2019-01-28 DIAGNOSIS — I69351 Hemiplegia and hemiparesis following cerebral infarction affecting right dominant side: Secondary | ICD-10-CM

## 2019-01-28 DIAGNOSIS — M6281 Muscle weakness (generalized): Secondary | ICD-10-CM

## 2019-01-28 DIAGNOSIS — R278 Other lack of coordination: Secondary | ICD-10-CM

## 2019-01-28 DIAGNOSIS — R293 Abnormal posture: Secondary | ICD-10-CM

## 2019-01-28 DIAGNOSIS — R2681 Unsteadiness on feet: Secondary | ICD-10-CM

## 2019-01-28 DIAGNOSIS — R2689 Other abnormalities of gait and mobility: Secondary | ICD-10-CM

## 2019-01-28 NOTE — Telephone Encounter (Signed)
  Follow up Call-  Call back number 01/23/2019  Post procedure Call Back phone  # [phone]  Permission to leave phone message Yes  Some recent data might be hidden     Patient questions:  Do you have a fever, pain , or abdominal swelling? No. Pain Score  0 *  Have you tolerated food without any problems? Yes.    Have you been able to return to your normal activities? Yes.    Do you have any questions about your discharge instructions: Diet   No. Medications  No. Follow up visit  No.  Do you have questions or concerns about your Care? No.  Actions: * If pain score is 4 or above: No action needed, pain <4.  1. Have you developed a fever since your procedure? no  2.   Have you had an respiratory symptoms (SOB or cough) since your procedure? no  3.   Have you tested positive for COVID 19 since your procedure no  4.   Have you had any family members/close contacts diagnosed with the COVID 19 since your procedure?  no   If yes to any of these questions please route to Joylene John, RN and Alphonsa Gin, Therapist, sports.

## 2019-01-29 NOTE — Therapy (Signed)
[facility] [address] [place], [place], [address] Phone: [phone]   Fax:  [phone]  Physical Therapy Treatment  Patient Details  Name: Rebekah Kennedy MRN: [id number] Date of Birth: 1955/08/11 Referring Provider (PT): Billey Gosling, MD   Encounter Date: 01/28/2019  PT End of Session - 01/29/19 1258    Visit Number  14    Number of Visits  17    Date for PT Re-Evaluation  02/28/19    Authorization Type  BCBS    PT Start Time  3009    PT Stop Time  1658    PT Time Calculation (min)  41 min    Equipment Utilized During Treatment  Gait belt    Activity Tolerance  Patient tolerated treatment well    Behavior During Therapy  [facility] for tasks assessed/performed       Past Medical History:  Diagnosis Date  . ASTHMA NOS W/ACUTE EXACERBATION 07/10/2010  . Cataract 2010   surgery  . Cervical dysplasia   . DEPRESSION 03/16/2007  . DIABETES MELLITUS, TYPE II 12/01/2006   dx 2004  . Dysmenorrhea   . Fibroid   . HYPERLIPIDEMIA 12/01/2006  . HYPERTENSION 07/07/2006  . Neuropathy   . Obesity   . Osteopenia 05/2017   T score -1.5 FRAX 2.6% / 0.1%  . Retinal edema    gets steriod inj in the eyes    . Sleep apnea    HAS CPAP BUT ADMITS DOES NOT USE JUDICIALLY   . Splenomegaly    in college  . Stroke (cerebrum) ([place]) 2020   left side pt states  . Stroke [facility]) 2012   R pontine, mild residual left hemiparesis  . TIA (transient ischemic attack) 2012   2 TIAs 1 week apart of each other     Past Surgical History:  Procedure Laterality Date  . 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 APPENDECTOMY    . BIOPSY THYROID  05/02/11   Nonneoplastic goiter  . BREAST BIOPSY    . BREAST LUMPECTOMY WITH RADIOACTIVE SEED LOCALIZATION Left 07/25/2017   Procedure: LEFT BREAST LUMPECTOMY WITH RADIOACTIVE SEED LOCALIZATION;  Surgeon: Coralie Keens, MD;  Location: [facility];  Service: General;  Laterality: Left;  . BREAST SURGERY     Reduction  . COLONOSCOPY    . COLPOSCOPY    . DILATION AND CURETTAGE OF UTERUS  1975   DUB  . EYE SURGERY     Laser  . GASTRIC ROUX-EN-Y N/A 08/12/2017   Procedure: LAPAROSCOPIC ROUX-EN-Y GASTRIC BYPASS WITH HIATAL HERNIA REPAIR AND UPPER ENDOSCOPY;  Surgeon: [name], [name], MD;  Location: [facility];  Service: General;  Laterality: N/A;  . GYNECOLOGIC CRYOSURGERY    . MYOMECTOMY    . OVARIAN CYST REMOVAL    . PELVIC LAPAROSCOPY  75,88   DL lysis of adhesions  . STOMACH SURGERY      There were no vitals filed for this visit.  Subjective Assessment - 01/28/19 1619    Subjective  No falls. Nothing new to report. Feels like overall her balance has been getting better.    Pertinent History  L thalamic stroke (06/2018), R pontine stroke (2012), HTN, Uncontrolled diabetes    Patient Stated Goals  Wants to improve her balance and get back to walking at the [facility] (when safely able to)         [facility] PT Assessment - 01/29/19 1256      Berg Balance Test   Sit  to Stand  Able to stand without using hands and stabilize independently    Standing Unsupported  Able to stand safely 2 minutes    Sitting with Back Unsupported but Feet Supported on Floor or Stool  Able to sit safely and securely 2 minutes    Stand to Sit  Sits safely with minimal use of hands    Transfers  Able to transfer safely, minor use of hands    Standing Unsupported with Eyes Closed  Able to stand 10 seconds safely    Standing Unsupported with Feet Together  Able to place feet together independently and stand 1 minute safely    From Standing, Reach Forward with Outstretched Arm  Can reach confidently >25 cm (10")    From Standing Position, Pick up Object from Floor  Able to pick up shoe safely and easily    From Standing Position, Turn to Look Behind Over each Shoulder  Looks behind from both sides and weight shifts well    Turn 360 Degrees  Able to turn 360 degrees safely in 4 seconds or less    Standing Unsupported,  Alternately Place Feet on Step/Stool  Able to complete 4 steps without aid or supervision    Standing Unsupported, One Foot in Front  Able to plae foot ahead of the other independently and hold 30 seconds    Standing on One Leg  Tries to lift leg/unable to hold 3 seconds but remains standing independently    Total Score  50    Berg comment:  50/56            01/28/19 0001  Transfers  Sit to Stand 5: Supervision  Five time sit to stand comments  16.13 seconds with no UE support from low blue mat table   Comments Discussed frequency of sit <> stand exercises for home and performing 10 reps vs. pt states that she is currently practicing it only a couple of times throughout the day.   Ambulation/Gait  Ambulation/Gait Yes  Ambulation/Gait Assistance 5: Supervision;4: Min guard  Ambulation/Gait Assistance Details Supervision with [facility] - pt demonstrating correct mechanics with cane, however has intermittent episodes of scissoring. Min guard without device (pt does not use device in her home and only uses SPC outside/in the community) and pt demonstrated no episodes of scissoring. Pt's gait speed is also faster without device - per pt this is because she is having to think more of the correct sequencing when using her cane.  Ambulation Distance (Feet) 500 Feet  Assistive device None;Straight cane  Gait Pattern Step-through pattern;Narrow base of support;Lateral hip instability;Decreased dorsiflexion - right;Scissoring  Ambulation Surface Level;Indoor  Gait velocity 14.82 seconds = 2.23 ft/sec w/ SPC (13.62 seconds = 2.41 ft/sec with no AD)  Stairs Yes  Stairs Assistance 5: Supervision  Stairs Assistance Details (indicate cue type and reason) Alternating pattern for ascending/descending with using single railing - descends more slowly.   Number of Stairs 4  Height of Stairs 6  Knee/Hip Exercises: Stretches  Other Knee/Hip Stretches Hip ADD butterfly stretch supine on mat table - 3 x 30 seconds  - verbally added to HEP  Knee/Hip Exercises: Sidelying  Clams Reviewed clam shell technique for HEP with green theraband - upgraded for home.                    PT Education - 01/29/19 1258    Education Details  progress towards LTGs, hip ADD butterfly stretch for HEP    Person(s)  Educated  Patient    Methods  Explanation;Demonstration    Comprehension  Verbalized understanding;Returned demonstration       PT Short Term Goals - 12/31/18 1048      PT SHORT TERM GOAL #1   Title  =LTGs        PT Long Term Goals - 01/29/19 1259      PT LONG TERM GOAL #1   Title  Patient will demonstrate proper and safe gait mechanics using SPC with no verbal cues from therapist in order to increase safety in the community.  (LTG due by 02/18/19)    Baseline  pt needing no cueing from therapist for proper gait mechanics with SPC for 115'    Time  4    Period  Weeks    Status  Achieved      PT LONG TERM GOAL #2   Title  Patient will increase BERG score to at least 52/56 in order to decrease risk of falls.    Baseline  50/56 on 01/28/19    Time  4    Period  Weeks    Status  On-going      PT LONG TERM GOAL #3   Title  Patient will ascend/descend 4 steps with LRAD using a step over step pattern and mod I with one railing in order for patient to safely enter and exit her home.    Baseline  4 with 1 rail with alternating sequence and supervision    Time  4    Period  Weeks    Status  On-going      PT LONG TERM GOAL #4   Title  Patient will increase gait speed to at least 2.6 ft/sec with cane vs. no AD in order to help decrease her risk of falls and improve community mobility.    Baseline  2.23 ft/sec with SPC with correct gait mechanics, 13.62 seconds = 2.41 ft/sec with no AD    Time  4    Period  Weeks    Status  Revised      PT LONG TERM GOAL #5   Title  Patient will decrease 5 times sit <> stand time to at least 17 seconds from chair without UE support in order to improve  functional LE strength for transfers.    Baseline  17.63 seconds on 01/07/19 - 16.13 seconds on 01/28/19    Time  4    Period  Weeks    Status  Achieved      Additional Long Term Goals   Additional Long Term Goals  Yes      PT LONG TERM GOAL #6   Title  Patient will be independent with final HEP for LE strengthening, stretching, and balance in order to build upon gains made in therapy.    Status  New         Added additional goal for independence with HEP - revised/downgraded gait speed goal as appropriate.   PT Long Term Goals - 01/29/19 1259      PT LONG TERM GOAL #1   Title  Patient will demonstrate proper and safe gait mechanics using SPC with no verbal cues from therapist in order to increase safety in the community.  (LTG due by 02/18/19)    Baseline  pt needing no cueing from therapist for proper gait mechanics with [facility] for 115'    Time  4    Period  Weeks    Status  Achieved  PT LONG TERM GOAL #2   Title  Patient will increase BERG score to at least 52/56 in order to decrease risk of falls.    Baseline  50/56 on 01/28/19    Time  4    Period  Weeks    Status  On-going      PT LONG TERM GOAL #3   Title  Patient will ascend/descend 4 steps with LRAD using a step over step pattern and mod I with one railing in order for patient to safely enter and exit her home.    Baseline  4 with 1 rail with alternating sequence and supervision    Time  4    Period  Weeks    Status  On-going      PT LONG TERM GOAL #4   Title  Patient will increase gait speed to at least 2.6 ft/sec with cane vs. no AD in order to help decrease her risk of falls and improve community mobility.    Baseline  2.23 ft/sec with SPC with correct gait mechanics, 13.62 seconds = 2.41 ft/sec with no AD    Time  4    Period  Weeks    Status  Revised      PT LONG TERM GOAL #5   Title  Patient will decrease 5 times sit <> stand time to at least 17 seconds from chair without UE support in order to improve  functional LE strength for transfers.    Baseline  17.63 seconds on 01/07/19 - 16.13 seconds on 01/28/19    Time  4    Period  Weeks    Status  Achieved      Additional Long Term Goals   Additional Long Term Goals  Yes      PT LONG TERM GOAL #6   Title  Patient will be independent with final HEP for LE strengthening, stretching, and balance in order to build upon gains made in therapy.    Status  New              Plan - 01/29/19 1305    Clinical Impression Statement  Focus of today's session was assessing LTGs. Pt has achieved goal for 5x sit <> stand time without UE support and correct sequencing with SPC. Pt has not met her goals and are on-going in relation to BERG score, stairs for safe home entry, and gait speed. Downgraded gait speed goal as appropriate and added new LTG for independence with HEP. Will see pt for 2 more therapy visits in order to finalize HEP for LE strengthening, balance, and ROM for 1x week for 2 weeks. Throughout therapy duration, pt has had to cancel a couple of her appointments and was not seen every week for the originally planned 2x week for 8 weeks. Will continue to progress towards LTGs.    PT Frequency  1x / week    PT Duration  2 weeks    PT Treatment/Interventions  Gait training;Stair training;Functional mobility training;Therapeutic activities;Therapeutic exercise;Balance training;Patient/family education;Neuromuscular re-education    PT Next Visit Plan  Finalize HEP for LE strengthening, balance, and stretching.    Consulted and Agree with Plan of Care  Patient       Patient will benefit from skilled therapeutic intervention in order to improve the following deficits and impairments:  Abnormal gait, Decreased balance, Decreased mobility, Decreased coordination, Decreased safety awareness, Decreased strength, Difficulty walking, Decreased activity tolerance  Visit Diagnosis: Muscle weakness (generalized)  Unsteadiness on feet  Hemiplegia  and  hemiparesis following cerebral infarction affecting right dominant side (HCC)  Abnormal posture  Other lack of coordination  Other abnormalities of gait and mobility     Problem List Patient Active Problem List   Diagnosis Date Noted  . Dermatitis of eyelid, contact or allergic 07/31/2018  . Right sided weakness 07/14/2018  . Left thalamic infarction ([place]) 07/12/2018  . Carotid artery disease ([place]) 07/09/2018  . Numbness and tingling 12/17/2017  . Right foot drop 12/17/2017  . Atherosclerosis of aorta ([place]) 10/09/2017  . Bariatric surgery status, 07/2017 08/19/2017  . Constipation 08/19/2017  . Hypertensive urgency 05/23/2017  . Greater trochanteric bursitis of both hips 12/19/2016  . OSA (obstructive sleep apnea) 07/26/2016  . Hypersomnia 06/11/2016  . Diabetes ([place]) 07/19/2015  . History of colonic polyps 12/01/2014  . Morbidly obese ([place]) 10/05/2013  . Partial nontraumatic tear of right rotator cuff 09/21/2013  . Goiter 12/10/2011  . Asthma 12/10/2011  . 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 History of TIA (transient ischemic attack) 02/13/2011  . PROTEINURIA, MILD 09/15/2009  . Hyperlipidemia 12/01/2006  . Essential hypertension 07/07/2006    Arliss Journey, PT, DPT 01/29/2019, 1:08 PM  [facility] [address] [place], [place], [address] Phone: [phone]   Fax:  [phone]  Name: MARQUITE ATTWOOD MRN: [id number] Date of Birth: 1955/12/25

## 2019-01-30 ENCOUNTER — Encounter: Payer: Self-pay | Admitting: Gastroenterology

## 2019-01-30 ENCOUNTER — Ambulatory Visit: Payer: BC Managed Care – PPO | Admitting: Physical Therapy

## 2019-02-02 ENCOUNTER — Ambulatory Visit: Payer: BC Managed Care – PPO | Admitting: Occupational Therapy

## 2019-02-02 ENCOUNTER — Other Ambulatory Visit: Payer: Self-pay | Admitting: Internal Medicine

## 2019-02-02 ENCOUNTER — Encounter: Payer: Self-pay | Admitting: Occupational Therapy

## 2019-02-02 ENCOUNTER — Other Ambulatory Visit: Payer: Self-pay

## 2019-02-02 DIAGNOSIS — M6281 Muscle weakness (generalized): Secondary | ICD-10-CM

## 2019-02-02 DIAGNOSIS — R293 Abnormal posture: Secondary | ICD-10-CM

## 2019-02-02 DIAGNOSIS — R2681 Unsteadiness on feet: Secondary | ICD-10-CM

## 2019-02-02 DIAGNOSIS — I69351 Hemiplegia and hemiparesis following cerebral infarction affecting right dominant side: Secondary | ICD-10-CM

## 2019-02-02 DIAGNOSIS — R278 Other lack of coordination: Secondary | ICD-10-CM

## 2019-02-02 NOTE — Therapy (Signed)
[facility] [address] [place], [place], [address] Phone: [phone]   Fax:  [phone]  Occupational Therapy Treatment  Patient Details  Name: Rebekah Kennedy MRN: [id number] Date of Birth: [birth year] Referring Provider (OT): Dr. Billey Gosling   Encounter Date: 02/02/2019  OT End of Session - 02/02/19 1945    Visit Number  10    Number of Visits  16    Date for OT Re-Evaluation  03/16/19   renewed on 02/02/2019 as pt had only had 10 visits in 8 weeks of therapy   Authorization Type  BCBS [place]    OT Start Time  1417    OT Stop Time  1504    OT Time Calculation (min)  47 min    Activity Tolerance  Patient tolerated treatment well       Past Medical History:  Diagnosis Date  . ASTHMA NOS W/ACUTE EXACERBATION 07/10/2010  . Cataract 2010   surgery  . Cervical dysplasia   . DEPRESSION 03/16/2007  . DIABETES MELLITUS, TYPE II 12/01/2006   dx 2004  . Dysmenorrhea   . Fibroid   . HYPERLIPIDEMIA 12/01/2006  . HYPERTENSION 07/07/2006  . Neuropathy   . Obesity   . Osteopenia 05/2017   T score -1.5 FRAX 2.6% / 0.1%  . Retinal edema    gets steriod inj in the eyes    . Sleep apnea    HAS CPAP BUT ADMITS DOES NOT USE JUDICIALLY   . Splenomegaly    in college  . Stroke (cerebrum) ([place]) 2020   left side pt states  . Stroke [facility]) 2012   R pontine, mild residual left hemiparesis  . TIA (transient ischemic attack) 2012   2 TIAs 1 week apart of each other     Past Surgical History:  Procedure Laterality Date  . 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 APPENDECTOMY    . BIOPSY THYROID  05/02/11   Nonneoplastic goiter  . BREAST BIOPSY    . BREAST LUMPECTOMY WITH RADIOACTIVE SEED LOCALIZATION Left 07/25/2017   Procedure: LEFT BREAST LUMPECTOMY WITH RADIOACTIVE SEED LOCALIZATION;  Surgeon: Coralie Keens, MD;  Location: [facility];  Service: General;  Laterality: Left;  . BREAST SURGERY     Reduction  . COLONOSCOPY    .  COLPOSCOPY    . DILATION AND CURETTAGE OF UTERUS  1975   DUB  . EYE SURGERY     Laser  . GASTRIC ROUX-EN-Y N/A 08/12/2017   Procedure: LAPAROSCOPIC ROUX-EN-Y GASTRIC BYPASS WITH HIATAL HERNIA REPAIR AND UPPER ENDOSCOPY;  Surgeon: [name], [name], MD;  Location[facility];  Service: General;  Laterality: N/A;  . GYNECOLOGIC CRYOSURGERY    . MYOMECTOMY    . OVARIAN CYST REMOVAL    . PELVIC LAPAROSCOPY  75,88   DL lysis of adhesions  . STOMACH SURGERY      There were no vitals filed for this visit.  Subjective Assessment - 02/02/19 1942    Subjective   I dont' think I did so well on those tests in PT but overall I am definitely more steady on my feet.    Pertinent History  Pt with L thalamic stroke in Feb 2020 as well as R pontine stroke in 2012.  HTN, Diabetes, HLD, depression.    Patient Stated Goals  My R hand sometimes does its own thing and I want to be able to write better.    Currently in Pain?  No/denies  Patient seen for aquatic therapy today.  Treatment took place in water 2.5-4 feet deep depending upon activity.  Pt entered the pool via steps using 2 hand rails and distant supervision.  Treatment focused on functional ambulation in open water using light dumb bells for UE support to address mobility and dynamic standing balance.  Also addressed LE strengthening for hip extension, abduction using light resistance band ( 15 reps x2 each for each LE). Pt needs min- mod cues and tactile cues for postural alignment during activities.  Addressed dynamic standing balance LE strengthening and control using large noodle for single leg marching with UE support on pool wall - pt initially required min a and mod cues for controlled movement of noodle however improved with repetition and practice. Addressed side stepping using ankle flotation devices for increased resistance with UE support - pt required steadying assist to complete as well as rest breaks.  Addressed dynamic standing  balance, postural alignment and control and LE strengthening using backward walking with light dumb bells for UE support. Pt much improved in this activity requiring steadying assist only 1 time for 2 lengths of the pool.  Pt exited the pool via steps using 2 hand rails and supervision.                       OT Short Term Goals - 02/02/19 1942      OT SHORT TERM GOAL #1   Title  Pt will be mod I for RUE coordination HEP ( 01/07/19 goal date adjusted as pt will be on hold for first two weeks of August due to scheduling)    Status  Achieved      OT SHORT TERM GOAL #2   Title  Pt will demonstrate improved coordination as evidenced by decreasing time on 9 hole peg by at least 4 seconds to assist with fine motor tasks    Baseline  31.93    Status  Achieved   01/07/2019  23.99     OT SHORT TERM GOAL #3   Title  Pt will report greater ease in using RUE to brush teeth    Status  Achieved   Pt reports much greater control when brushing her teeth       OT Long Term Goals - 02/02/19 1942      OT LONG TERM GOAL #1   Title  Pt will be mod I with upgraded HEP for functional use of RUE/balance/activity tolerance - 03/16/2019 (renewed on 02/02/2019 as pt had only had 10 visits in 8 weeks of therapy)    Status  On-going      OT LONG TERM GOAL #2   Title  Pt will demonstrate improved RUE coordination as evidenced by decreasing time on 9 hole peg test by at least 6 seconds to assist with fine motor tasks    Baseline  31.93    Status  Achieved   01/07/2019 23.99     OT LONG TERM GOAL #3   Title  Pt will demonstrate ability to use RUE to pick up, drink and set cup down without spilling using RUE.    Status  Partially Met      OT LONG TERM GOAL #4   Title  Pt will be able to write 3 sentence paragraph legibly, AE prn    Status  Achieved      OT LONG TERM GOAL #5   Title  Pt will demonstrate ability to walk outside on uneven  ground mod I for return to leisure and work related  activities    Status  On-going      OT [place] #6   Title  Pt will demonstrate ability to complete simple shopping task mod I    Status  On-going            Plan - 02/02/19 1944    Clinical Impression Statement  Pt continues to make overall progress - pt reports that at times "I just have a bad day here and there but overall that is happening less and less." Pt more steady when ambulating from parking lot into and out of pool area.    OT Occupational Profile and History  Detailed Assessment- Review of Records and additional review of physical, cognitive, psychosocial history related to current functional performance    Occupational performance deficits (Please refer to evaluation for details):  ADL's;IADL's;Work;Leisure;Social Participation    Body Structure / Function / Physical Skills  ADL;Balance;Coordination;FMC;GMC;IADL;Muscle spasms;Tone;UE functional use    Rehab Potential  Good    Clinical Decision Making  Several treatment options, min-mod task modification necessary    Comorbidities Affecting Occupational Performance:  May have comorbidities impacting occupational performance    Modification or Assistance to Complete Evaluation   Min-Moderate modification of tasks or assist with assess necessary to complete eval    OT Frequency  1x / week    OT Duration  6 weeks    OT Treatment/Interventions  Self-care/ADL training;Aquatic Therapy;DME and/or AE instruction;Neuromuscular education;Therapeutic exercise;Functional Mobility Training;Manual Therapy;Therapeutic activities;Patient/family education;Balance training    Plan  aquatic therapy 1x/wk to address dynamic standing balance, postural control, trunk control, LE strengthening and functional ambulation.    Consulted and Agree with Plan of Care  Patient       Patient will benefit from skilled therapeutic intervention in order to improve the following deficits and impairments:   Body Structure / Function / Physical Skills:  ADL, Balance, Coordination, FMC, GMC, IADL, Muscle spasms, Tone, UE functional use       Visit Diagnosis: Muscle weakness (generalized)  Unsteadiness on feet  Hemiplegia and hemiparesis following cerebral infarction affecting right dominant side (HCC)  Abnormal posture  Other lack of coordination    Problem List Patient Active Problem List   Diagnosis Date Noted  . Dermatitis of eyelid, contact or allergic 07/31/2018  . Right sided weakness 07/14/2018  . Left thalamic infarction ([place]) 07/12/2018  . Carotid artery disease ([place]) 07/09/2018  . Numbness and tingling 12/17/2017  . Right foot drop 12/17/2017  . Atherosclerosis of aorta ([place]) 10/09/2017  . Bariatric surgery status, 07/2017 08/19/2017  . Constipation 08/19/2017  . Hypertensive urgency 05/23/2017  . Greater trochanteric bursitis of both hips 12/19/2016  . OSA (obstructive sleep apnea) 07/26/2016  . Hypersomnia 06/11/2016  . Diabetes ([place]) 07/19/2015  . History of colonic polyps 12/01/2014  . Morbidly obese ([place]) 10/05/2013  . Partial nontraumatic tear of right rotator cuff 09/21/2013  . Goiter 12/10/2011  . Asthma 12/10/2011  . 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 History of TIA (transient ischemic attack) 02/13/2011  . PROTEINURIA, MILD 09/15/2009  . Hyperlipidemia 12/01/2006  . Essential hypertension 07/07/2006    Quay Burow, OTR/L 02/02/2019, 7:53 PM  [facility] [address] [place], [place], [address] Phone: [phone]   Fax:  [phone]  Name: Rebekah Kennedy MRN: [id number] Date of Birth: 1956/01/28

## 2019-02-04 ENCOUNTER — Encounter: Payer: Self-pay | Admitting: Neurology

## 2019-02-06 ENCOUNTER — Other Ambulatory Visit: Payer: Self-pay

## 2019-02-06 ENCOUNTER — Ambulatory Visit: Payer: BC Managed Care – PPO | Admitting: Physical Therapy

## 2019-02-06 DIAGNOSIS — R2689 Other abnormalities of gait and mobility: Secondary | ICD-10-CM

## 2019-02-06 DIAGNOSIS — M6281 Muscle weakness (generalized): Secondary | ICD-10-CM | POA: Diagnosis not present

## 2019-02-06 DIAGNOSIS — R278 Other lack of coordination: Secondary | ICD-10-CM

## 2019-02-06 DIAGNOSIS — R293 Abnormal posture: Secondary | ICD-10-CM

## 2019-02-06 DIAGNOSIS — I69351 Hemiplegia and hemiparesis following cerebral infarction affecting right dominant side: Secondary | ICD-10-CM

## 2019-02-06 DIAGNOSIS — R2681 Unsteadiness on feet: Secondary | ICD-10-CM

## 2019-02-06 NOTE — Therapy (Signed)
[facility] [address] [place], [place], [address] Phone: [phone]   Fax:  [phone]  Physical Therapy Treatment  Patient Details  Name: Rebekah Kennedy MRN: [id number] Date of Birth: [birth year] Referring Provider (PT): Billey Gosling, MD   Encounter Date: 02/06/2019  PT End of Session - 02/06/19 1154    Visit Number  15    Number of Visits  17    Date for PT Re-Evaluation  02/28/19    Authorization Type  BCBS    PT Start Time  0851    PT Stop Time  0931    PT Time Calculation (min)  40 min    Equipment Utilized During Treatment  Gait belt    Activity Tolerance  Patient tolerated treatment well    Behavior During Therapy  [facility] for tasks assessed/performed       Past Medical History:  Diagnosis Date  . ASTHMA NOS W/ACUTE EXACERBATION 07/10/2010  . Cataract 2010   surgery  . Cervical dysplasia   . DEPRESSION 03/16/2007  . DIABETES MELLITUS, TYPE II 12/01/2006   dx 2004  . Dysmenorrhea   . Fibroid   . HYPERLIPIDEMIA 12/01/2006  . HYPERTENSION 07/07/2006  . Neuropathy   . Obesity   . Osteopenia 05/2017   T score -1.5 FRAX 2.6% / 0.1%  . Retinal edema    gets steriod inj in the eyes    . Sleep apnea    HAS CPAP BUT ADMITS DOES NOT USE JUDICIALLY   . Splenomegaly    in college  . Stroke (cerebrum) ([place]) 2020   left side pt states  . Stroke [facility]) 2012   R pontine, mild residual left hemiparesis  . TIA (transient ischemic attack) 2012   2 TIAs 1 week apart of each other     Past Surgical History:  Procedure Laterality Date  . 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 APPENDECTOMY    . BIOPSY THYROID  05/02/11   Nonneoplastic goiter  . BREAST BIOPSY    . BREAST LUMPECTOMY WITH RADIOACTIVE SEED LOCALIZATION Left 07/25/2017   Procedure: LEFT BREAST LUMPECTOMY WITH RADIOACTIVE SEED LOCALIZATION;  Surgeon: Coralie Keens, MD;  Location: [facility];  Service: General;  Laterality: Left;  . BREAST SURGERY     Reduction  . COLONOSCOPY    . COLPOSCOPY    . DILATION AND CURETTAGE OF UTERUS  1975   DUB  . EYE SURGERY     Laser  . GASTRIC ROUX-EN-Y N/A 08/12/2017   Procedure: LAPAROSCOPIC ROUX-EN-Y GASTRIC BYPASS WITH HIATAL HERNIA REPAIR AND UPPER ENDOSCOPY;  Surgeon: [name], [name], MD;  Location: [facility];  Service: General;  Laterality: N/A;  . GYNECOLOGIC CRYOSURGERY    . MYOMECTOMY    . OVARIAN CYST REMOVAL    . PELVIC LAPAROSCOPY  75,88   DL lysis of adhesions  . STOMACH SURGERY      There were no vitals filed for this visit.  Subjective Assessment - 02/06/19 0853    Subjective  Almost tripped the other day. Thinks it was the dog toy on the floor - wasn't using her cane.    Pertinent History  L thalamic stroke (06/2018), R pontine stroke (2012), HTN, Uncontrolled diabetes    Patient Stated Goals  Wants to improve her balance and get back to walking at the [facility] (when safely able to)  [facility] Adult PT Treatment/Exercise - 02/06/19 1156      Transfers   Sit to Stand  5: Supervision    Sit to Stand Details  Verbal cues for sequencing;Verbal cues for technique    Sit to Stand Details (indicate cue type and reason)  2 x 10 reps with wide BOS to target hip ABD - cues for technique and eccentric control. Pt with tendency to brace BLEs posteriorly on mat. Pt has not been performing 10 reps at home, instructed pt on reps to perform at home for BLE strengthening.      Ambulation/Gait   Ambulation/Gait  Yes    Ambulation/Gait Assistance  4: Min guard    Ambulation/Gait Assistance Details  Minimal clinc distances without SPC    Ambulation Distance (Feet)  50 Feet    Assistive device  None    Gait Pattern  Step-through pattern;Narrow base of support;Lateral hip instability;Decreased dorsiflexion - right;Scissoring    Ambulation Surface  Level;Indoor      Knee/Hip Exercises: Stretches   Other Knee/Hip Stretches  Hip ADD butterfly stretch supine on mat  table, intstructed to hold for 20-30 seconds for stretch, 3 x 20 second reps      Knee/Hip Exercises: Supine   Bridges  2 sets;10 reps    Bridges Limitations  with green theraband, cues for hip ABD before lifting, staggering RLE slightly behind LLE - pt reporting she has not been using green theraband for hip ABD with this exercise for home      Knee/Hip Exercises: Sidelying   Clams  Reviewed clamshell technique for HEP, pt with tendency to rotate trunk posteriorly when performing. 2 x 10 reps B - pt able to demonstrate correct technique during 2nd set without cueing from therapist         Access Code: [email or web address]  URL: [email or web address]  Date: 02/06/2019  Prepared by: Janann August   Exercises Sit to Stand - 10 reps - 1 sets - 2x daily - 7x weekly Clamshell - 10 reps - 3 sets - 2x daily - 7x weekly -->reviewed technique Side Stepping with Counter Support - 10 reps - 2 sets - 2x daily - 7x weekly Heel rises with counter support - 10 reps - 3 sets - 2x daily - 7x weekly Standing Tandem Balance with Counter Support - 5 reps - 2 sets - 1x daily - 7x weekly Bent Knee Fallouts - 10 reps - 2 sets - 1x daily - 7x weekly Supine Bridge with Resistance Band - 10 reps - 2 sets - 1x daily - 7x weekly --> green band  Supine Butterfly Groin Stretch - 3 sets - 20 hold - 2x daily - 7x weekly Side Lunge Adductor Stretch - 10 reps - 3 sets - 1x daily - 7x weekly --> new addition to HEP  Tandem Walking with Counter Support - 3 sets - 2x daily - 7x weekly --> new addition to HEP      PT Education - 02/06/19 1148    Education Details  reviewed HEP - as well as new additions for hip ADD stretching and balance    Person(s) Educated  Patient    Methods  Explanation;Demonstration    Comprehension  Verbalized understanding;Returned demonstration       PT Short Term Goals - 12/31/18 1048      PT SHORT TERM GOAL #1   Title  =LTGs        PT Long Term Goals - 01/29/19 1259  PT LONG TERM GOAL #1   Title  Patient will demonstrate proper and safe gait mechanics using SPC with no verbal cues from therapist in order to increase safety in the community.  (LTG due by 02/18/19)    Baseline  pt needing no cueing from therapist for proper gait mechanics with SPC for 115'    Time  4    Period  Weeks    Status  Achieved      PT LONG TERM GOAL #2   Title  Patient will increase BERG score to at least 52/56 in order to decrease risk of falls.    Baseline  50/56 on 01/28/19    Time  4    Period  Weeks    Status  On-going      PT LONG TERM GOAL #3   Title  Patient will ascend/descend 4 steps with LRAD using a step over step pattern and mod I with one railing in order for patient to safely enter and exit her home.    Baseline  4 with 1 rail with alternating sequence and supervision    Time  4    Period  Weeks    Status  On-going      PT LONG TERM GOAL #4   Title  Patient will increase gait speed to at least 2.6 ft/sec with cane vs. no AD in order to help decrease her risk of falls and improve community mobility.    Baseline  2.23 ft/sec with SPC with correct gait mechanics, 13.62 seconds = 2.41 ft/sec with no AD    Time  4    Period  Weeks    Status  Revised      PT LONG TERM GOAL #5   Title  Patient will decrease 5 times sit <> stand time to at least 17 seconds from chair without UE support in order to improve functional LE strength for transfers.    Baseline  17.63 seconds on 01/07/19 - 16.13 seconds on 01/28/19    Time  4    Period  Weeks    Status  Achieved      Additional Long Term Goals   Additional Long Term Goals  Yes      PT LONG TERM GOAL #6   Title  Patient will be independent with final HEP for LE strengthening, stretching, and balance in order to build upon gains made in therapy.    Status  New            Plan - 02/06/19 1206    Clinical Impression Statement  Focus of today's session was reviewing/adding exercises to HEP. Pt required multi-modal  cueing for most exercises at home in regards to technique and number of repetitions pt should be performing at home. Reviewed technique for bridging and clamshells - pt able to demonstrate technique during 2nd set without cueing from therapist. Added additional hip ADD stretch for home. Will continue for 2 more visits to finalize HEP for continued gains for LE strength, ROM and balance.    PT Frequency  1x / week    PT Duration  2 weeks    PT Treatment/Interventions  Gait training;Stair training;Functional mobility training;Therapeutic activities;Therapeutic exercise;Balance training;Patient/family education;Neuromuscular re-education    PT Next Visit Plan  Finalize HEP for LE strengthening, balance, and stretching. Gait without AD.    PT Home Exercise Plan  [email or web address]    Consulted and Agree with Plan of Care  Patient       Patient  will benefit from skilled therapeutic intervention in order to improve the following deficits and impairments:  Abnormal gait, Decreased balance, Decreased mobility, Decreased coordination, Decreased safety awareness, Decreased strength, Difficulty walking, Decreased activity tolerance  Visit Diagnosis: Muscle weakness (generalized)  Unsteadiness on feet  Abnormal posture  Hemiplegia and hemiparesis following cerebral infarction affecting right dominant side (HCC)  Other abnormalities of gait and mobility  Other lack of coordination     Problem List Patient Active Problem List   Diagnosis Date Noted  . Dermatitis of eyelid, contact or allergic 07/31/2018  . Right sided weakness 07/14/2018  . Left thalamic infarction ([place]) 07/12/2018  . Carotid artery disease ([place]) 07/09/2018  . Numbness and tingling 12/17/2017  . Right foot drop 12/17/2017  . Atherosclerosis of aorta ([place]) 10/09/2017  . Bariatric surgery status, 07/2017 08/19/2017  . Constipation 08/19/2017  . Hypertensive urgency 05/23/2017  . Greater trochanteric bursitis of both hips 12/19/2016  .  OSA (obstructive sleep apnea) 07/26/2016  . Hypersomnia 06/11/2016  . Diabetes ([place]) 07/19/2015  . History of colonic polyps 12/01/2014  . Morbidly obese ([place]) 10/05/2013  . Partial nontraumatic tear of right rotator cuff 09/21/2013  . Goiter 12/10/2011  . Asthma 12/10/2011  . 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 History of TIA (transient ischemic attack) 02/13/2011  . PROTEINURIA, MILD 09/15/2009  . Hyperlipidemia 12/01/2006  . Essential hypertension 07/07/2006    Arliss Journey, PT, DPT 02/06/2019, 12:09 PM  [facility] [address] [place], [place], [address] Phone: [phone]   Fax:  [phone]  Name: Rebekah Kennedy MRN: [id number] Date of Birth: May 22, 1955

## 2019-02-06 NOTE — Patient Instructions (Signed)
Access Code: [email or web address]  URL: [email or web address]  Date: 02/06/2019  Prepared by: Janann August   Exercises Sit to Stand - 10 reps - 1 sets - 2x daily - 7x weekly Clamshell - 10 reps - 3 sets - 2x daily - 7x weekly Side Stepping with Counter Support - 10 reps - 2 sets - 2x daily - 7x weekly Heel rises with counter support - 10 reps - 3 sets - 2x daily - 7x weekly Standing Tandem Balance with Counter Support - 5 reps - 2 sets - 1x daily - 7x weekly Bent Knee Fallouts - 10 reps - 2 sets - 1x daily - 7x weekly Supine Bridge with Resistance Band - 10 reps - 2 sets - 1x daily - 7x weekly Supine Butterfly Groin Stretch - 3 sets - 20 hold - 2x daily - 7x weekly Side Lunge Adductor Stretch - 10 reps - 3 sets - 1x daily - 7x weekly Tandem Walking with Counter Support - 3 sets - 2x daily - 7x weekly

## 2019-02-09 ENCOUNTER — Other Ambulatory Visit: Payer: Self-pay

## 2019-02-09 ENCOUNTER — Ambulatory Visit: Payer: BC Managed Care – PPO | Admitting: Neurology

## 2019-02-09 ENCOUNTER — Ambulatory Visit: Payer: BC Managed Care – PPO | Admitting: Occupational Therapy

## 2019-02-09 ENCOUNTER — Encounter: Payer: Self-pay | Admitting: Neurology

## 2019-02-09 ENCOUNTER — Encounter: Payer: Self-pay | Admitting: Occupational Therapy

## 2019-02-09 VITALS — BP 112/62 | HR 70 | Ht 66.0 in | Wt 170.0 lb

## 2019-02-09 DIAGNOSIS — R278 Other lack of coordination: Secondary | ICD-10-CM

## 2019-02-09 DIAGNOSIS — I69351 Hemiplegia and hemiparesis following cerebral infarction affecting right dominant side: Secondary | ICD-10-CM

## 2019-02-09 DIAGNOSIS — I639 Cerebral infarction, unspecified: Secondary | ICD-10-CM | POA: Diagnosis not present

## 2019-02-09 DIAGNOSIS — I6381 Other cerebral infarction due to occlusion or stenosis of small artery: Secondary | ICD-10-CM

## 2019-02-09 DIAGNOSIS — R2681 Unsteadiness on feet: Secondary | ICD-10-CM

## 2019-02-09 DIAGNOSIS — M6281 Muscle weakness (generalized): Secondary | ICD-10-CM

## 2019-02-09 DIAGNOSIS — R293 Abnormal posture: Secondary | ICD-10-CM

## 2019-02-09 DIAGNOSIS — E1142 Type 2 diabetes mellitus with diabetic polyneuropathy: Secondary | ICD-10-CM

## 2019-02-09 DIAGNOSIS — G5731 Lesion of lateral popliteal nerve, right lower limb: Secondary | ICD-10-CM

## 2019-02-09 NOTE — Patient Instructions (Signed)
Recommend using rollator for long distances.  You may want to check it out first at a medical supply store and then verify that it will be covered with your FSA account  Talk to occupational therapy about hand device to help you drive  Return to clinic in 6 months

## 2019-02-09 NOTE — Therapy (Signed)
[facility] [address] [place], [place], [address] Phone: [phone]   Fax:  [phone]  Occupational Therapy Treatment  Patient Details  Name: Rebekah Kennedy MRN: [id number] Date of Birth: [birth year] Referring Provider (OT): Dr. Billey Gosling   Encounter Date: 02/09/2019  OT End of Session - 02/09/19 1903    Visit Number  11    Number of Visits  16    Date for OT Re-Evaluation  03/16/19    Authorization Type  BCBS [place]    OT Start Time  1417    OT Stop Time  1500    OT Time Calculation (min)  43 min    Activity Tolerance  Patient tolerated treatment well       Past Medical History:  Diagnosis Date  . ASTHMA NOS W/ACUTE EXACERBATION 07/10/2010  . Cataract 2010   surgery  . Cervical dysplasia   . DEPRESSION 03/16/2007  . DIABETES MELLITUS, TYPE II 12/01/2006   dx 2004  . Dysmenorrhea   . Fibroid   . HYPERLIPIDEMIA 12/01/2006  . HYPERTENSION 07/07/2006  . Neuropathy   . Obesity   . Osteopenia 05/2017   T score -1.5 FRAX 2.6% / 0.1%  . Retinal edema    gets steriod inj in the eyes    . Sleep apnea    HAS CPAP BUT ADMITS DOES NOT USE JUDICIALLY   . Splenomegaly    in college  . Stroke (cerebrum) ([place]) 2020   left side pt states  . Stroke [facility]) 2012   R pontine, mild residual left hemiparesis  . TIA (transient ischemic attack) 2012   2 TIAs 1 week apart of each other     Past Surgical History:  Procedure Laterality Date  . 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 APPENDECTOMY    . BIOPSY THYROID  05/02/11   Nonneoplastic goiter  . BREAST BIOPSY    . BREAST LUMPECTOMY WITH RADIOACTIVE SEED LOCALIZATION Left 07/25/2017   Procedure: LEFT BREAST LUMPECTOMY WITH RADIOACTIVE SEED LOCALIZATION;  Surgeon: Coralie Keens, MD;  Location: [facility];  Service: General;  Laterality: Left;  . BREAST SURGERY     Reduction  . COLONOSCOPY    . COLPOSCOPY    . DILATION AND CURETTAGE OF UTERUS  1975   DUB  . EYE  SURGERY     Laser  . GASTRIC ROUX-EN-Y N/A 08/12/2017   Procedure: LAPAROSCOPIC ROUX-EN-Y GASTRIC BYPASS WITH HIATAL HERNIA REPAIR AND UPPER ENDOSCOPY;  Surgeon: [name], [name], MD;  Location: [facility];  Service: General;  Laterality: N/A;  . GYNECOLOGIC CRYOSURGERY    . MYOMECTOMY    . OVARIAN CYST REMOVAL    . PELVIC LAPAROSCOPY  75,88   DL lysis of adhesions  . STOMACH SURGERY      There were no vitals filed for this visit.  Subjective Assessment - 02/09/19 1901    Subjective   I didn't think I was going to like this water therapy but I do    Pertinent History  Pt with L thalamic stroke in Feb 2020 as well as R pontine stroke in 2012.  HTN, Diabetes, HLD, depression.    Patient Stated Goals  My R hand sometimes does its own thing and I want to be able to write better.    Currently in Pain?  No/denies         Patient seen for aquatic therapy today.  Treatment took place in water 2.5-4 feet deep depending upon  activity.  Pt entered the pool via steps and using 2 hand railings with supervision.  Treatment focused initially on functional ambulation and dynamic standing balance in open water using light dumb bells for UE support.  Pt with 2 episodes of LOB but able to recover without physical assistance.  Addressed LE strengthening with single leg marching using light noodle ( 15 reps x2) - instructed pt on how to place noodle for herself to complete activity - at this time pt needs min a however will continue to address in anticipation of establishment of HEP. Addressed dynamic standing balance using activity with standing on noodle with single UE support.  Addressed dynamic standing balance with backward walking with only light UE support and supervision - pt able to maintain balance for 2 pool lengths using visual fixation to assist.  Addressed LE strengthening for ab/adduction as well as postural alignment and dynamic standing balance with side stepping using small dumb bells for UE  support - pt with more difficulty going to the right. Pt required light steadying assist through dumb bells for this activity.  Addressed UE strengthening using light dumb bells for shoulder flexion/extension, ab/adduction and shoulder extension ( 10 reps x2 each) in open water in order to also challenge dynamic standing balance and core control.  Throughout the session instructed pt on activity modification to allow pt to complete activities safely without physical support in preparation for HEP.  Pt verbalized understanding however will need reinforcement.  Pt states her daughter will be willing to get in the water with her for supervision but she would prefer to be able to do the actual activities without physical assistance.  Assured pt that HEP can be designed that way.  Pt exited the pool via steps using 2 hand railings and distant supervision.                       OT Short Term Goals - 02/09/19 1901      OT SHORT TERM GOAL #1   Title  Pt will be mod I for RUE coordination HEP ( 01/07/19 goal date adjusted as pt will be on hold for first two weeks of August due to scheduling)    Status  Achieved      OT SHORT TERM GOAL #2   Title  Pt will demonstrate improved coordination as evidenced by decreasing time on 9 hole peg by at least 4 seconds to assist with fine motor tasks    Baseline  31.93    Status  Achieved   01/07/2019  23.99     OT SHORT TERM GOAL #3   Title  Pt will report greater ease in using RUE to brush teeth    Status  Achieved   Pt reports much greater control when brushing her teeth       OT Long Term Goals - 02/09/19 1902      OT LONG TERM GOAL #1   Title  Pt will be mod I with upgraded HEP for functional use of RUE/balance/activity tolerance - 03/16/2019 (renewed on 02/02/2019 as pt had only had 10 visits in 8 weeks of therapy)    Status  On-going      OT LONG TERM GOAL #2   Title  Pt will demonstrate improved RUE coordination as evidenced by  decreasing time on 9 hole peg test by at least 6 seconds to assist with fine motor tasks    Baseline  31.93    Status  Achieved   01/07/2019 23.99     OT LONG TERM GOAL #3   Title  Pt will demonstrate ability to use RUE to pick up, drink and set cup down without spilling using RUE.    Status  Partially Met      OT LONG TERM GOAL #4   Title  Pt will be able to write 3 sentence paragraph legibly, AE prn    Status  Achieved      OT LONG TERM GOAL #5   Title  Pt will demonstrate ability to walk outside on uneven ground mod I for return to leisure and work related activities    Status  On-going      OT LONG TERM GOAL #6   Title  Pt will demonstrate ability to complete simple shopping task mod I    Status  On-going            Plan - 02/09/19 1902    Clinical Impression Statement  Pt continues to progress toward goals. Pt reported that she saw her neurologist today and that she was very pleased with pt's progress.    OT Occupational Profile and History  Detailed Assessment- Review of Records and additional review of physical, cognitive, psychosocial history related to current functional performance    Occupational performance deficits (Please refer to evaluation for details):  ADL's;IADL's;Work;Leisure;Social Participation    Body Structure / Function / Physical Skills  ADL;Balance;Coordination;FMC;GMC;IADL;Muscle spasms;Tone;UE functional use    Rehab Potential  Good    Clinical Decision Making  Several treatment options, min-mod task modification necessary    Comorbidities Affecting Occupational Performance:  May have comorbidities impacting occupational performance    Modification or Assistance to Complete Evaluation   Min-Moderate modification of tasks or assist with assess necessary to complete eval    OT Frequency  1x / week    OT Duration  6 weeks    OT Treatment/Interventions  Self-care/ADL training;Aquatic Therapy;DME and/or AE instruction;Neuromuscular education;Therapeutic  exercise;Functional Mobility Training;Manual Therapy;Therapeutic activities;Patient/family education;Balance training    Plan  aquatic therapy 1x/wk to address dynamic standing balance, postural control, trunk control, LE strengthening and functional ambulation.    Consulted and Agree with Plan of Care  Patient       Patient will benefit from skilled therapeutic intervention in order to improve the following deficits and impairments:   Body Structure / Function / Physical Skills: ADL, Balance, Coordination, FMC, GMC, IADL, Muscle spasms, Tone, UE functional use       Visit Diagnosis: Muscle weakness (generalized)  Unsteadiness on feet  Abnormal posture  Hemiplegia and hemiparesis following cerebral infarction affecting right dominant side (HCC)  Other lack of coordination    Problem List Patient Active Problem List   Diagnosis Date Noted  . Dermatitis of eyelid, contact or allergic 07/31/2018  . Right sided weakness 07/14/2018  . Left thalamic infarction ([place]) 07/12/2018  . Carotid artery disease ([place]) 07/09/2018  . Numbness and tingling 12/17/2017  . Right foot drop 12/17/2017  . Atherosclerosis of aorta ([place]) 10/09/2017  . Bariatric surgery status, 07/2017 08/19/2017  . Constipation 08/19/2017  . Hypertensive urgency 05/23/2017  . Greater trochanteric bursitis of both hips 12/19/2016  . OSA (obstructive sleep apnea) 07/26/2016  . Hypersomnia 06/11/2016  . Diabetes ([place]) 07/19/2015  . History of colonic polyps 12/01/2014  . Morbidly obese ([place]) 10/05/2013  . Partial nontraumatic tear of right rotator cuff 09/21/2013  . Goiter 12/10/2011  . Asthma 12/10/2011  . 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  History of TIA (transient ischemic attack) 02/13/2011  . PROTEINURIA, MILD 09/15/2009  . Hyperlipidemia 12/01/2006  . Essential hypertension 07/07/2006    Quay Burow, OTR/L 02/09/2019, 7:04 PM  [facility] [address] [place], [place], [address] Phone: [phone]   Fax:  [phone]  Name: Rebekah Kennedy MRN: [id number] Date of Birth: 18-Jul-1955

## 2019-02-09 NOTE — Progress Notes (Signed)
Follow-up Visit   Date: 02/09/19    Rebekah Kennedy MRN: [id number] DOB: Jan 11, 1956   Interim History: Rebekah Kennedy is a 63 y.o. right-handed African American female with history of uncontrolled diabetes mellitus (dx 2008, HbA1c 8.2), hyperlipidemia, hypertension, depression, and right pontine stroke (2012, mild left residual weakness) returning to the clinic with recent left thalamic stroke (06/2018) and right foot drop.  The patient was accompanied to the clinic by self.  History of present illness: UPDATE 12/27/2017:  She is here with new complaints of right >> left foot numbness over the top of the foot and toes, which started in June.  Symptoms are constant without identifiable triggers.  She has also noticed weakness with extending her toe on the right and slight dragging of the foot.  She does not have weakness of the left foot, but has noticed intermittent numbness.  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  Imaging did not show impingement of the L5 nerve, which would explain her foot drop.  She continues to have foot weakness and frequently trips.  She has started to use a cane for balance, which helps.  UPDATE 07/21/2018:  She suffered a left thalamic stroke on 2/17 manifesting with right numbness/tingling, dizziness, imbalance and fall.  She was started on aspirin 81 mg and Plavix 75 mg daily.  Over the past 2 weeks, she has noticed improved balance, but continues to have lack of coordination of the right hand.  She has been using a walker and is working with physical therapy.  Her right foot weakness has improved and she has been able to walk without using her AFO.  She continues to have some numbness and tingling of the right lower extremity.  UPDATE 02/09/2019:  She is here for follow-up.  She is doing well and feels that her gait and  coordination has improved.  She is walking without AFO and uses a cane for stability.  Fortunately, she has not suffered any falls. Her blood pressure and diabetes is under much better control.  She will be retiring October 1st and has been on disability since her stroke.  Numbness and tingling remains stable in the feet and fingertips.   Medications:  Current Outpatient Medications on File Prior to Visit  Medication Sig Dispense Refill  . albuterol (PROVENTIL HFA;VENTOLIN HFA) 108 (90 Base) MCG/ACT inhaler Inhale 2 puffs into the lungs every 4 (four) hours as needed for wheezing or shortness of breath. 18 g 2  . amLODipine (NORVASC) 2.5 MG tablet Take 1 tablet (2.5 mg total) by mouth daily. 30 tablet 5  . aspirin EC 81 MG EC tablet Take 1 tablet (81 mg total) by mouth daily. 30 tablet 0  . Biotin 5000 MCG TABS Take 5,000 mcg by mouth daily.     . calcium-vitamin D (OSCAL WITH D) 500-200 MG-UNIT tablet Take 1 tablet by mouth.    . clopidogrel (PLAVIX) 75 MG tablet TAKE 1 TABLET BY MOUTH EVERY DAY 90 tablet 1  . FARXIGA 5 MG TABS tablet TAKE 1 TABLET BY MOUTH EVERY DAY 90 tablet 0  . JANUVIA 100 MG tablet TAKE 1 TABLET BY MOUTH EVERY DAY 90 tablet 0  . losartan (COZAAR) 100 MG tablet TAKE 1 TAB BY MOUTH ONCE DAILY. 90 tablet 1  . metoprolol succinate (TOPROL-XL) 100 MG 24 hr tablet TAKE 1  TABLET BY MOUTH DAILY WITH OR IMMEDIATELY FOLLOWING A MEAL 90 tablet 1  . montelukast (SINGULAIR) 10 MG tablet TAKE 1 TABLET BY MOUTH EVERY DAY 90 tablet 1  . OVER THE COUNTER MEDICATION Take 1 tablet by mouth daily. BariMelt    . rosuvastatin (CRESTOR) 10 MG tablet TAKE 1 TABLET BY MOUTH EVERY DAY 90 tablet 3  . tacrolimus (PROTOPIC) 0.1 % ointment APPLY TO AFFECTED AREA TWICE A DAY (Patient taking differently: daily as needed. ) 30 g 0   No current facility-administered medications on file prior to visit.     Allergies:  Allergies  Allergen Reactions  . Quinapril Cough    Review of Systems:   CONSTITUTIONAL: No fevers, chills, night sweats, or weight loss.  EYES: No visual changes or eye pain ENT: No hearing changes.  No history of nose bleeds.   RESPIRATORY: No cough, wheezing and shortness of breath.   CARDIOVASCULAR: Negative for chest pain, and palpitations.   GI: Negative for abdominal discomfort, blood in stools or black stools.  No recent change in bowel habits.   GU:  No history of incontinence.   MUSCLOSKELETAL: No history of joint pain or swelling.  No myalgias.   SKIN: Negative for lesions, rash, and itching.   ENDOCRINE: Negative for cold or heat intolerance, polydipsia or goiter.   PSYCH:  No depression or anxiety symptoms.   NEURO: As Above.   Vital Signs:  BP 112/62   Pulse 70   Ht 5\' 6"  (1.676 m)   Wt 170 lb (77.1 kg)   SpO2 95%   BMI 27.44 kg/m   General Medical Exam:   General:  Well appearing, comfortable  Eyes/ENT: see cranial nerve examination.   Neck:   No carotid bruits. Respiratory:  Clear to auscultation, good air entry bilaterally.   Cardiac:  Regular rate and rhythm, no murmur.   Ext:  No edema  Neurological Exam: MENTAL STATUS including orientation to time, place, person, recent and remote memory, attention span and concentration, language, and fund of knowledge is normal.  Speech is not dysarthric.  CRANIAL NERVES:  Visual fields intact.  Extraocular muscles intact. No ptosis.  Face is symmetric.    MOTOR:  Mild bilateral R leg atrophy, no fasciculations or abnormal movements.  No pronator drift.  Tone is normal.   Right Upper Extremity:    Left Upper Extremity:    Deltoid  5/5   Deltoid  5/5   Biceps  5/5   Biceps  5/5   Triceps  5/5   Triceps  5/5   Wrist extensors  5/5   Wrist extensors  5/5   Wrist flexors  5/5   Wrist flexors  5/5   Finger extensors  5/5   Finger extensors  5/5   Finger flexors  5/5   Finger flexors  5/5   Dorsal interossei  5/5   Dorsal interossei  5/5   Abductor pollicis  5/5   Abductor pollicis  5/5    Tone (Ashworth scale)  0  Tone (Ashworth scale)  0   Right Lower Extremity:    Left Lower Extremity:    Hip flexors  5/5   Hip flexors  5/5   Hip extensors  5/5   Hip extensors  5/5   Knee flexors  5/5   Knee flexors  5/5   Knee extensors  5/5   Knee extensors  5/5   Dorsiflexors  5-/5   Dorsiflexors  5/5   Plantarflexors  5/5  Plantarflexors  5/5   Eversion 5-/5  Eversion 5/5  Inversion 5/5  Inversion 5/5  Toe extensors  5-/5   Toe extensors  5/5   Toe flexors  5/5   Toe flexors  5/5   Tone (Ashworth scale)  0  Tone (Ashworth scale)  0   SENSATION:  Intact to pin prick and vibration at the ankles, reduced in the feet  COORDINATION/GAIT:  Trace dysmetria with finger to nose testing on the right (improved).  Intact rapid alternating movements bilaterally.  She is able to walk unassisted, but appears unsteady, slight RLE spasticity.   Data: Lab Results  Component Value Date   CHOL 156 10/23/2018   HDL 64.90 10/23/2018   LDLCALC 68 10/23/2018   LDLDIRECT 126.7 03/24/2013   TRIG 117.0 10/23/2018   CHOLHDL 2 10/23/2018   Lab Results  Component Value Date   HGBA1C 6.7 (H) 10/23/2018   MRI brain 04/2011: 1. Acute lacunar type infarcts in the right mid brain and pons. No mass effect or hemorrhage.  2. Superimposed chronic lacunar infarct in the right paracentral pons. These findings indicate acute on chronic small vessel ischemia.  3. Otherwise mild for age nonspecific cerebral white matter signal changes.  4. Intracranial MRA findings are below.  MRA brain 04/2011: 1. 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 No major branch occlusion. There is irregularity suggesting atherosclerosis in the right proximal PCA.  2. Mild anterior circulation atherosclerosis. No stenosis or major branch occlusion.  MRI lumbar spine 02/28/2018: 1. Mild disc bulging and facet hypertrophy at L3-4, L4-5, and L5-S1 contributes to mild foraminal narrowing. 2. No other  significant stenosis is evident to explain foot drop.  MRI/A brain wo contrast 07/07/2018:  Small area of acute infarction left anterior thalamus.  Mild chronic microvascular ischemic changes in the white matter.  Intracranial atherosclerotic disease as above. Negative for emergent large vessel occlusion.  TTE 07/08/2018:  EF 60-65%, no PFO Lab Results  Component Value Date   CHOL 156 10/23/2018   HDL 64.90 10/23/2018   LDLCALC 68 10/23/2018   LDLDIRECT 126.7 03/24/2013   TRIG 117.0 10/23/2018   CHOLHDL 2 10/23/2018     IMPRESSION/PLAN: 1.  Left thalamic stroke due to small vessel disease (06/2018, right side ataxia) on plavix, therefore started on aspirin + plavix.  She has prior R pontine stroke in 2012 with residual mild left hemiparesis. Gait appears mildly spastic, recommend using a rollator for long distances. Continue PT and OT.  Recommend talking to OT about hand assist devices  Cholesterol is well-controlled on Crestor 10mg .  Last LDL in June 2020 was 68 Blood pressure is better controlled after adding amlodipine  2.  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  She is no longer using AFO.  3.  Diabetic polyneuropathy.  Diabetes is doing much better, HbA1c 6.7.  Return to clinic in 6 months   Thank you for allowing me to participate in patient's care.  If I can answer any additional questions, I would be pleased to do so.    Sincerely,    Donika K. Posey Pronto, DO

## 2019-02-10 ENCOUNTER — Ambulatory Visit: Payer: BC Managed Care – PPO | Admitting: Physical Therapy

## 2019-02-10 DIAGNOSIS — R2689 Other abnormalities of gait and mobility: Secondary | ICD-10-CM

## 2019-02-10 DIAGNOSIS — M6281 Muscle weakness (generalized): Secondary | ICD-10-CM

## 2019-02-10 DIAGNOSIS — R2681 Unsteadiness on feet: Secondary | ICD-10-CM

## 2019-02-11 NOTE — Therapy (Signed)
[facility] [address] [place], [place], [address] Phone: [phone]   Fax:  [phone]  Physical Therapy Treatment  Patient Details  Name: Rebekah Kennedy MRN: [id number] Date of Birth: [birth year] Referring Provider (PT): Billey Gosling, MD   Encounter Date: 02/10/2019  PT End of Session - 02/11/19 1022    Visit Number  16    Number of Visits  17    Date for PT Re-Evaluation  02/28/19    Authorization Type  [employer]    PT Start Time  1700    PT Stop Time  1747    PT Time Calculation (min)  47 min    Equipment Utilized During Treatment  Gait belt    Activity Tolerance  Patient tolerated treatment well    Behavior During Therapy  [facility] for tasks assessed/performed       Past Medical History:  Diagnosis Date  . ASTHMA NOS W/ACUTE EXACERBATION 07/10/2010  . Cataract 2010   surgery  . Cervical dysplasia   . DEPRESSION 03/16/2007  . DIABETES MELLITUS, TYPE II 12/01/2006   dx 2004  . Dysmenorrhea   . Fibroid   . HYPERLIPIDEMIA 12/01/2006  . HYPERTENSION 07/07/2006  . Neuropathy   . Obesity   . Osteopenia 05/2017   T score -1.5 FRAX 2.6% / 0.1%  . Retinal edema    gets steriod inj in the eyes    . Sleep apnea    HAS CPAP BUT ADMITS DOES NOT USE JUDICIALLY   . Splenomegaly    in college  . Stroke (cerebrum) ([place]) 2020   left side pt states  . Stroke [facility]) 2012   R pontine, mild residual left hemiparesis  . TIA (transient ischemic attack) 2012   2 TIAs 1 week apart of each other     Past Surgical History:  Procedure Laterality Date  . 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 APPENDECTOMY    . BIOPSY THYROID  05/02/11   Nonneoplastic goiter  . BREAST BIOPSY    . BREAST LUMPECTOMY WITH RADIOACTIVE SEED LOCALIZATION Left 07/25/2017   Procedure: LEFT BREAST LUMPECTOMY WITH RADIOACTIVE SEED LOCALIZATION;  Surgeon: Coralie Keens, MD;  Location: [facility];  Service: General;  Laterality: Left;  . BREAST SURGERY     Reduction  . COLONOSCOPY    . COLPOSCOPY    . DILATION AND CURETTAGE OF UTERUS  1975   DUB  . EYE SURGERY     Laser  . GASTRIC ROUX-EN-Y N/A 08/12/2017   Procedure: LAPAROSCOPIC ROUX-EN-Y GASTRIC BYPASS WITH HIATAL HERNIA REPAIR AND UPPER ENDOSCOPY;  Surgeon: [name], [name], MD;  Location: [facility];  Service: General;  Laterality: N/A;  . GYNECOLOGIC CRYOSURGERY    . MYOMECTOMY    . OVARIAN CYST REMOVAL    . PELVIC LAPAROSCOPY  75,88   DL lysis of adhesions  . STOMACH SURGERY      There were no vitals filed for this visit.  Subjective Assessment - 02/10/19 2156    Subjective  Pt reports Dr. Posey Pronto told her to get a rollator to have to use when walking long distances; pt states someone gave her one but she has not used it yet - states she really does not want to use it but got it because MD told her to do so    Pertinent History  L thalamic stroke (06/2018), R pontine stroke (2012), HTN, Uncontrolled diabetes    Patient Stated Goals  Wants to improve her balance and  get back to walking at the [facility] (when safely able to)    Currently in Pain?  No/denies                       [facility] Adult PT Treatment/Exercise - 02/11/19 0001      Transfers   Transfers  Sit to Stand;Stand to Sit    Sit to Stand  4: Min guard    Sit to Stand Details (indicate cue type and reason)  10 reps without UE support from mat & with feet on blue airex     Stand to Sit  6: Modified independent (Device/Increase time)      Ambulation/Gait   Ambulation/Gait  Yes    Ambulation/Gait Assistance  5: Supervision    Ambulation/Gait Assistance Details  verbal cues for correct hand placement with sit to/from transfers and for locking brakes prior to sitting     Ambulation Distance (Feet)  115 Feet    Assistive device  4-wheeled walker   used due to MD having pt get one for long distance amb.   Gait Pattern  Step-through pattern    Ambulation Surface  Level;Indoor    Stairs  Yes    Stairs Assistance   5: Supervision    Stair Management Technique  Alternating pattern;One rail Left;Forwards    Number of Stairs  4    Height of Stairs  6    Gait Comments  Pt also gait trained 230' without device with cues for increased step length and initial heel contact at stance with SBA       High Level Balance   High Level Balance Comments  Pt performed kicking bean bags approx. 40' alternating feet for improved coordination & SLS - with CGA           Balance Exercises - 02/11/19 1018      Balance Exercises: Standing   Rockerboard  Anterior/posterior;10 reps;Intermittent UE support   standing on board w/ horizontal head turns 10 reps w/UE supp   Gait with Head Turns  Forward;2 reps   30' x 2    Sidestepping  2 reps;Other (comment)   inside // bars w/squats without UE support   Other Standing Exercises  pt stood on inverted Bosu inside // bars with UE support prn - weight shifts anterior/posterior 10 reps, then 10 reps laterally with CGA:  performed SLS activity with each leg in middle of Bosu - moved other leg up/back and laterally with minimal UE support for improved SLS and isometric strengthening of stance leg       Pt performed amb. Forwards 115' tossing/catching ball for improved balance with multi-tasking with gait with CGA Backwards amb. Tossing ball approx. 90' with CGA - no major LOB with this activity  Pt performed tap ups to 1st step alternating feet 5 reps each; then tap ups to 2nd step without UE support 10 reps each foot with CGA    PT Short Term Goals - 12/31/18 1048      PT SHORT TERM GOAL #1   Title  =LTGs        PT Long Term Goals - 02/11/19 1028      PT LONG TERM GOAL #1   Title  Patient will demonstrate proper and safe gait mechanics using SPC with no verbal cues from therapist in order to increase safety in the community.  (LTG due by 02/18/19)    Baseline  pt needing no cueing from therapist for proper gait  mechanics with SPC for 115'    Time  4    Period  Weeks     Status  Achieved      PT LONG TERM GOAL #2   Title  Patient will increase BERG score to at least 52/56 in order to decrease risk of falls.    Baseline  50/56 on 01/28/19    Time  4    Period  Weeks    Status  On-going      PT LONG TERM GOAL #3   Title  Patient will ascend/descend 4 steps with LRAD using a step over step pattern and mod I with one railing in order for patient to safely enter and exit her home.    Baseline  4 with 1 rail with alternating sequence and supervision    Time  4    Period  Weeks    Status  On-going      PT LONG TERM GOAL #4   Title  Patient will increase gait speed to at least 2.6 ft/sec with cane vs. no AD in order to help decrease her risk of falls and improve community mobility.    Baseline  2.23 ft/sec with SPC with correct gait mechanics, 13.62 seconds = 2.41 ft/sec with no AD    Time  4    Period  Weeks    Status  Revised      PT LONG TERM GOAL #5   Title  Patient will decrease 5 times sit <> stand time to at least 17 seconds from chair without UE support in order to improve functional LE strength for transfers.    Baseline  17.63 seconds on 01/07/19 - 16.13 seconds on 01/28/19    Time  4    Period  Weeks    Status  Achieved      PT LONG TERM GOAL #6   Title  Patient will be independent with final HEP for LE strengthening, stretching, and balance in order to build upon gains made in therapy.    Status  New            Plan - 02/11/19 1023    Clinical Impression Statement  Pt able to perform mult-tasking with gait (amb. forwards/backwards tossing ball) without LOB; pt did very well with standing balance activities on compliant surfaces without LOB.  Session also focused on correct use of rollator due to Dr. Posey Pronto ordering patient to obtain this device for use with long distance ambulation to have assistance with gait when she becomes fatigued.  Pt had no LOB with gait training without device in today's session.  Plan D/C next visit.    PT  Frequency  1x / week    PT Duration  2 weeks    PT Treatment/Interventions  Gait training;Stair training;Functional mobility training;Therapeutic activities;Therapeutic exercise;Balance training;Patient/family education;Neuromuscular re-education    PT Next Visit Plan  Finalize HEP for LE strengthening - D/C next session    PT Home Exercise Plan  [id number]    Consulted and Agree with Plan of Care  Patient       Patient will benefit from skilled therapeutic intervention in order to improve the following deficits and impairments:  Abnormal gait, Decreased balance, Decreased mobility, Decreased coordination, Decreased safety awareness, Decreased strength, Difficulty walking, Decreased activity tolerance  Visit Diagnosis: Other abnormalities of gait and mobility  Unsteadiness on feet  Muscle weakness (generalized)     Problem List Patient Active Problem List   Diagnosis Date Noted  .  Dermatitis of eyelid, contact or allergic 07/31/2018  . Right sided weakness 07/14/2018  . Left thalamic infarction ([place]) 07/12/2018  . Carotid artery disease ([place]) 07/09/2018  . Numbness and tingling 12/17/2017  . Right foot drop 12/17/2017  . Atherosclerosis of aorta ([place]) 10/09/2017  . Bariatric surgery status, 07/2017 08/19/2017  . Constipation 08/19/2017  . Hypertensive urgency 05/23/2017  . Greater trochanteric bursitis of both hips 12/19/2016  . OSA (obstructive sleep apnea) 07/26/2016  . Hypersomnia 06/11/2016  . Diabetes ([place]) 07/19/2015  . History of colonic polyps 12/01/2014  . Morbidly obese ([place]) 10/05/2013  . Partial nontraumatic tear of right rotator cuff 09/21/2013  . Goiter 12/10/2011  . Asthma 12/10/2011  . 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 History of TIA (transient ischemic attack) 02/13/2011  . PROTEINURIA, MILD 09/15/2009  . Hyperlipidemia 12/01/2006  . Essential hypertension 07/07/2006    Alda Lea, PT 02/11/2019, 10:32 AM  [facility] [address] [place], [place], [address] Phone: [phone]   Fax:  [phone]  Name: Rebekah Kennedy MRN: [id number] Date of Birth: [birth year]

## 2019-02-12 ENCOUNTER — Ambulatory Visit: Payer: BC Managed Care – PPO | Admitting: Physical Therapy

## 2019-02-16 ENCOUNTER — Ambulatory Visit: Payer: BC Managed Care – PPO | Admitting: Occupational Therapy

## 2019-02-16 ENCOUNTER — Encounter: Payer: Self-pay | Admitting: Occupational Therapy

## 2019-02-16 ENCOUNTER — Other Ambulatory Visit: Payer: Self-pay

## 2019-02-16 DIAGNOSIS — M6281 Muscle weakness (generalized): Secondary | ICD-10-CM

## 2019-02-16 DIAGNOSIS — R293 Abnormal posture: Secondary | ICD-10-CM

## 2019-02-16 DIAGNOSIS — I69351 Hemiplegia and hemiparesis following cerebral infarction affecting right dominant side: Secondary | ICD-10-CM

## 2019-02-16 DIAGNOSIS — R278 Other lack of coordination: Secondary | ICD-10-CM

## 2019-02-16 DIAGNOSIS — R2681 Unsteadiness on feet: Secondary | ICD-10-CM

## 2019-02-16 NOTE — Therapy (Signed)
[facility] [address] [place], [place], [address] Phone: [phone]   Fax:  [phone]  Occupational Therapy Treatment  Patient Details  Name: Rebekah Kennedy MRN: [id number] Date of Birth: 04-21-1956 Referring Provider (OT): Dr. Billey Gosling   Encounter Date: 02/16/2019  OT End of Session - 02/16/19 2035    Visit Number  12    Number of Visits  16    Date for OT Re-Evaluation  03/16/19    Authorization Type  [employer] [place]    OT Start Time  6861   pt arrived late   OT Stop Time  1505    OT Time Calculation (min)  40 min    Activity Tolerance  Patient tolerated treatment well       Past Medical History:  Diagnosis Date  . ASTHMA NOS W/ACUTE EXACERBATION 07/10/2010  . Cataract 2010   surgery  . Cervical dysplasia   . DEPRESSION 03/16/2007  . DIABETES MELLITUS, TYPE II 12/01/2006   dx 2004  . Dysmenorrhea   . Fibroid   . HYPERLIPIDEMIA 12/01/2006  . HYPERTENSION 07/07/2006  . Neuropathy   . Obesity   . Osteopenia 05/2017   T score -1.5 FRAX 2.6% / 0.1%  . Retinal edema    gets steriod inj in the eyes    . Sleep apnea    HAS CPAP BUT ADMITS DOES NOT USE JUDICIALLY   . Splenomegaly    in college  . Stroke (cerebrum) ([place]) 2020   left side pt states  . Stroke [facility]) 2012   R pontine, mild residual left hemiparesis  . TIA (transient ischemic attack) 2012   2 TIAs 1 week apart of each other     Past Surgical History:  Procedure Laterality Date  . 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 APPENDECTOMY    . BIOPSY THYROID  05/02/11   Nonneoplastic goiter  . BREAST BIOPSY    . BREAST LUMPECTOMY WITH RADIOACTIVE SEED LOCALIZATION Left 07/25/2017   Procedure: LEFT BREAST LUMPECTOMY WITH RADIOACTIVE SEED LOCALIZATION;  Surgeon: Coralie Keens, MD;  Location: [place];  Service: General;  Laterality: Left;  . BREAST SURGERY     Reduction  . COLONOSCOPY    . COLPOSCOPY    . DILATION AND CURETTAGE OF UTERUS  1975    DUB  . EYE SURGERY     Laser  . GASTRIC ROUX-EN-Y N/A 08/12/2017   Procedure: LAPAROSCOPIC ROUX-EN-Y GASTRIC BYPASS WITH HIATAL HERNIA REPAIR AND UPPER ENDOSCOPY;  Surgeon: [name], [name], MD;  Location[facility];  Service: General;  Laterality: N/A;  . GYNECOLOGIC CRYOSURGERY    . MYOMECTOMY    . OVARIAN CYST REMOVAL    . PELVIC LAPAROSCOPY  75,88   DL lysis of adhesions  . STOMACH SURGERY      There were no vitals filed for this visit.  Subjective Assessment - 02/16/19 2032    Subjective   I am runnng late because I couldn't find my water shoes    Pertinent History  Pt with L thalamic stroke in Feb 2020 as well as R pontine stroke in 2012.  HTN, Diabetes, HLD, depression.    Patient Stated Goals  My R hand sometimes does its own thing and I want to be able to write better.    Currently in Pain?  No/denies           Patient seen for aquatic therapy today.  Treatment took place in water 2.5-4 feet  deep depending upon activity.  Pt entered the pool via steps using 2 hand railings and supervision.  Addressed functional ambulation and dynamic standing balance in open water with light dumb bells for UE support 2 lengths of pool.  Also addressed dynamic standing balance with backward walking using gaze fixation to assist. Pt able to complete with supervision only.  Further addressed dynamic standing balance and functional ambulation using Unpredictable Command Technique for stops, starts, head turns, head nods 3 lengths of pool. Initially pt needed intermittent steadying assist however with repetition and practice able to complete with supervision.  Addressed LE strengthening using resistance band for hip extension and hip ab/adduction (15 reps x2 for each) as well as addressing balance and LE strengthening using side stepping with flotation devices on ankles to add more resistance 2 lengths of pool (right then left).  Today for first time assisted pt in transitioning from standing to supine  using flotation devices at hips and head - initially pt very nervous however with experience in this position pt grew more comfortable. Once comfort level improved addressed core control with supine activities as well as supported reciprocal kicking to address LE strengthening and coordination.  Transitioned back into standing with moderate assistance and pt stated "I was afraid but then it was kind of fun - I would work in that position again."  Pt exited the pool via steps using 2 hand railings with supervision.                     OT Short Term Goals - 02/16/19 2033      OT SHORT TERM GOAL #1   Title  Pt will be mod I for RUE coordination HEP ( 01/07/19 goal date adjusted as pt will be on hold for first two weeks of August due to scheduling)    Status  Achieved      OT SHORT TERM GOAL #2   Title  Pt will demonstrate improved coordination as evidenced by decreasing time on 9 hole peg by at least 4 seconds to assist with fine motor tasks    Baseline  31.93    Status  Achieved   01/07/2019  23.99     OT SHORT TERM GOAL #3   Title  Pt will report greater ease in using RUE to brush teeth    Status  Achieved   Pt reports much greater control when brushing her teeth       OT Long Term Goals - 02/16/19 2033      OT LONG TERM GOAL #1   Title  Pt will be mod I with upgraded HEP for functional use of RUE/balance/activity tolerance - 03/16/2019 (renewed on 02/02/2019 as pt had only had 10 visits in 8 weeks of therapy)    Status  On-going      OT LONG TERM GOAL #2   Title  Pt will demonstrate improved RUE coordination as evidenced by decreasing time on 9 hole peg test by at least 6 seconds to assist with fine motor tasks    Baseline  31.93    Status  Achieved   01/07/2019 23.99     OT LONG TERM GOAL #3   Title  Pt will demonstrate ability to use RUE to pick up, drink and set cup down without spilling using RUE.    Status  Partially Met      OT LONG TERM GOAL #4   Title  Pt  will be able to write 3  sentence paragraph legibly, AE prn    Status  Achieved      OT LONG TERM GOAL #5   Title  Pt will demonstrate ability to walk outside on uneven ground mod I for return to leisure and work related activities    Status  On-going      OT LONG TERM GOAL #6   Title  Pt will demonstrate ability to complete simple shopping task mod I    Status  On-going            Plan - 02/16/19 2034    Clinical Impression Statement  Pt continues to progress toward goals. Pt with improving dynamic standing balance and postural control    OT Occupational Profile and History  Detailed Assessment- Review of Records and additional review of physical, cognitive, psychosocial history related to current functional performance    Occupational performance deficits (Please refer to evaluation for details):  ADL's;IADL's;Work;Leisure;Social Participation    Body Structure / Function / Physical Skills  ADL;Balance;Coordination;FMC;GMC;IADL;Muscle spasms;Tone;UE functional use    Rehab Potential  Good    Clinical Decision Making  Several treatment options, min-mod task modification necessary    Comorbidities Affecting Occupational Performance:  May have comorbidities impacting occupational performance    Modification or Assistance to Complete Evaluation   Min-Moderate modification of tasks or assist with assess necessary to complete eval    OT Frequency  1x / week    OT Duration  6 weeks    OT Treatment/Interventions  Self-care/ADL training;Aquatic Therapy;DME and/or AE instruction;Neuromuscular education;Therapeutic exercise;Functional Mobility Training;Manual Therapy;Therapeutic activities;Patient/family education;Balance training    Plan  aquatic therapy 1x/wk to address dynamic standing balance, postural control, trunk control, LE strengthening and functional ambulation.    Consulted and Agree with Plan of Care  Patient       Patient will benefit from skilled therapeutic intervention in  order to improve the following deficits and impairments:   Body Structure / Function / Physical Skills: ADL, Balance, Coordination, FMC, GMC, IADL, Muscle spasms, Tone, UE functional use       Visit Diagnosis: Unsteadiness on feet  Muscle weakness (generalized)  Abnormal posture  Hemiplegia and hemiparesis following cerebral infarction affecting right dominant side (HCC)  Other lack of coordination    Problem List Patient Active Problem List   Diagnosis Date Noted  . Dermatitis of eyelid, contact or allergic 07/31/2018  . Right sided weakness 07/14/2018  . Left thalamic infarction ([place]) 07/12/2018  . Carotid artery disease ([place]) 07/09/2018  . Numbness and tingling 12/17/2017  . Right foot drop 12/17/2017  . Atherosclerosis of aorta ([place]) 10/09/2017  . Bariatric surgery status, 07/2017 08/19/2017  . Constipation 08/19/2017  . Hypertensive urgency 05/23/2017  . Greater trochanteric bursitis of both hips 12/19/2016  . OSA (obstructive sleep apnea) 07/26/2016  . Hypersomnia 06/11/2016  . Diabetes ([place]) 07/19/2015  . History of colonic polyps 12/01/2014  . Morbidly obese ([place]) 10/05/2013  . Partial nontraumatic tear of right rotator cuff 09/21/2013  . Goiter 12/10/2011  . Asthma 12/10/2011  . 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 History of TIA (transient ischemic attack) 02/13/2011  . PROTEINURIA, MILD 09/15/2009  . Hyperlipidemia 12/01/2006  . Essential hypertension 07/07/2006    Quay Burow, OTR/L 02/16/2019, 8:36 PM  [facility] [address] [place], [place], [address] Phone: [phone]   Fax:  [phone]  Name: GWYNNETH FABIO MRN: [id number] Date of Birth: 11/28/1955

## 2019-02-17 ENCOUNTER — Encounter: Payer: Self-pay | Admitting: Gynecology

## 2019-02-18 IMAGING — MR MR HEAD W/O CM
9 of 10 series · 35 of 48 positions shown · non-contrast
Comparison: Previous MRI from 02/15/2017.

CLINICAL DATA: Initial evaluation for acute sudden onset dizziness,
ataxia.

EXAM:
MRI HEAD WITHOUT CONTRAST
TECHNIQUE: Multiplanar, multiecho pulse sequences of the brain and surrounding
structures were obtained without intravenous contrast.

[Series 3: DWI · axial · 3.0mm · 0.94mm/px · z∈[-71,+60]mm · 8 of 100 slices shown (1 of 2)]
[im 1/100]
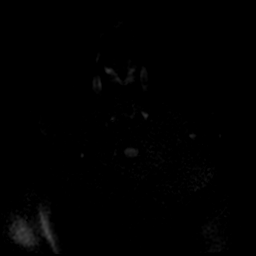
[im 12/100]
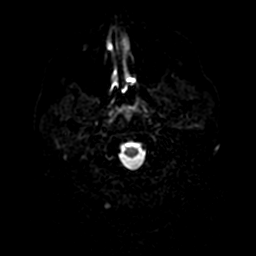
[im 34/100]
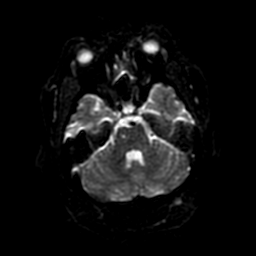
[im 45/100]
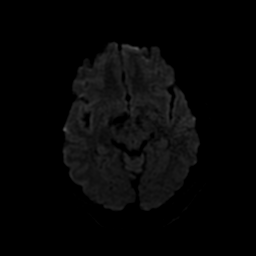
[im 56/100]
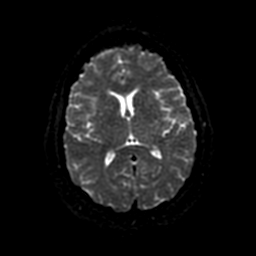
[im 67/100]
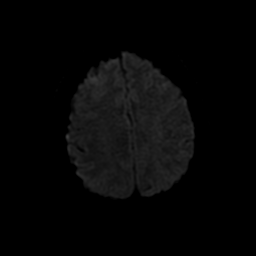
[im 89/100]
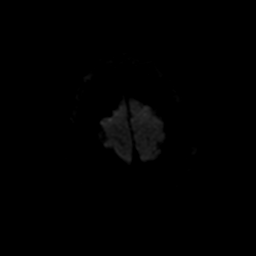
[im 100/100]
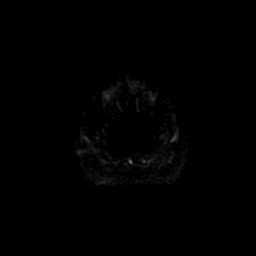

[Series 4: FLAIR · axial · 3.0mm · 0.94mm/px · z∈[-70,+58]mm · 2 of 25 slices shown (1 of 2)]
[im 1/25]
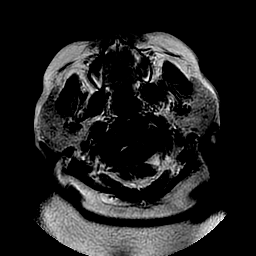
[im 25/25]
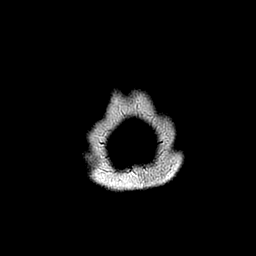

[Series 5: DWI · coronal · 4.0mm · 0.94mm/px · 6 of 64 slices shown (2 of 2)]
[im 1/64]
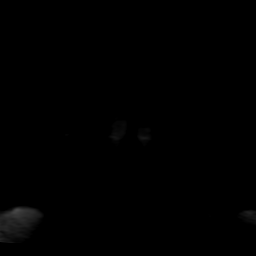
[im 13/64]
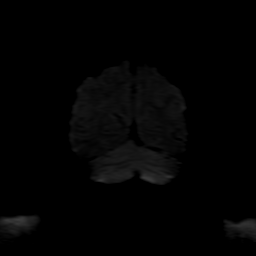
[im 26/64]
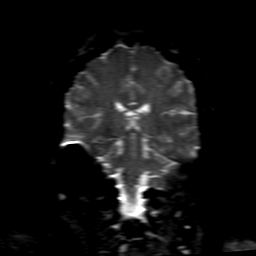
[im 38/64]
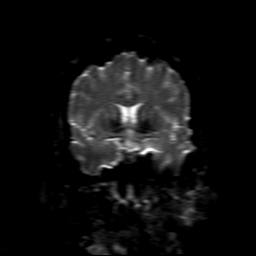
[im 51/64]
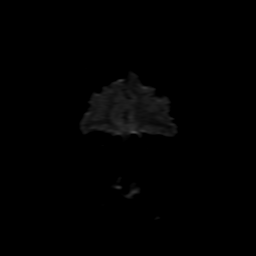
[im 64/64]
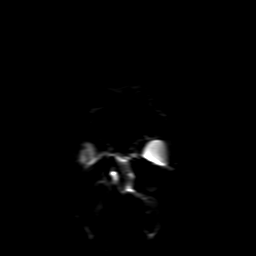

[Series 6: (person_name) · axial · 3.0mm · 0.47mm/px · z∈[-71,-13]mm · 4 of 100 slices shown]
[im 1/100]
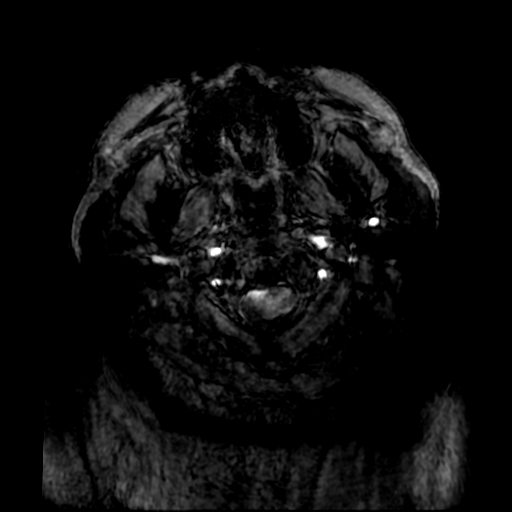
[im 12/100]
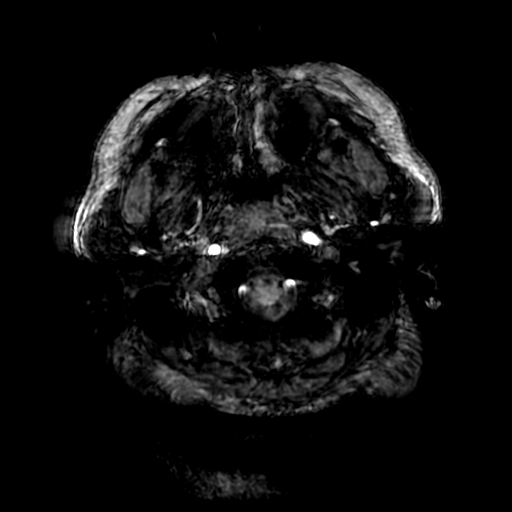
[im 34/100]
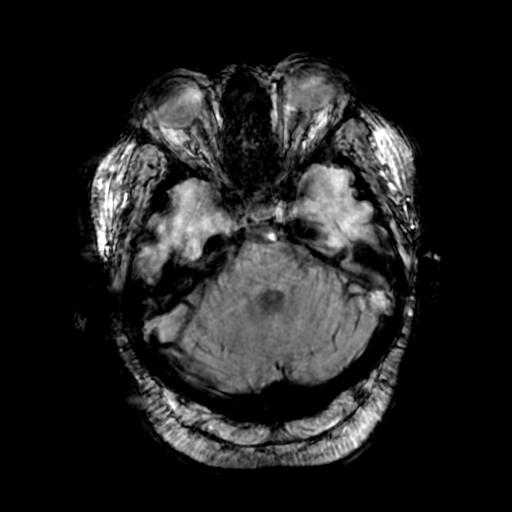
[im 45/100]
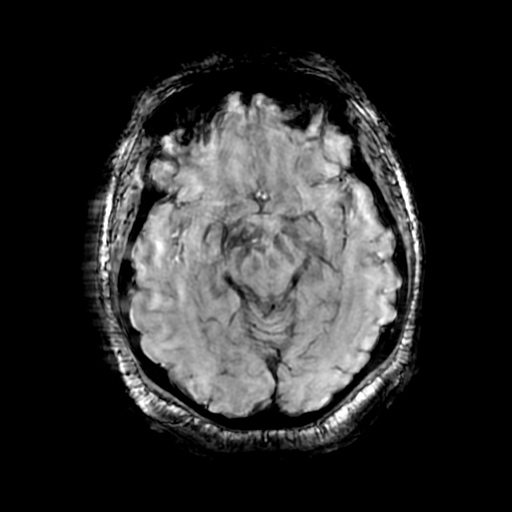

[Series 8: T2 · axial · 5.0mm · 0.47mm/px · z∈[-70,+58]mm · 2 of 25 slices shown (1 of 2)]
[im 1/25]
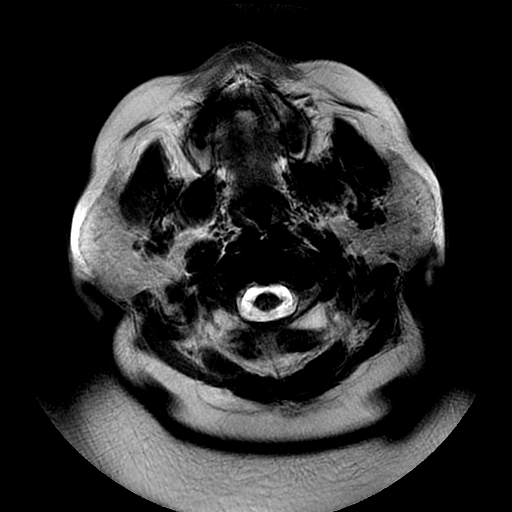
[im 25/25]
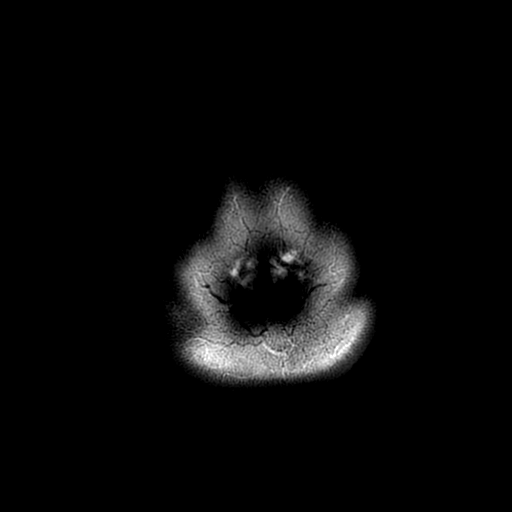

[Series 9: FLAIR · sagittal · 5.0mm · 0.47mm/px · 2 of 21 slices shown (2 of 2)]
[im 1/21]
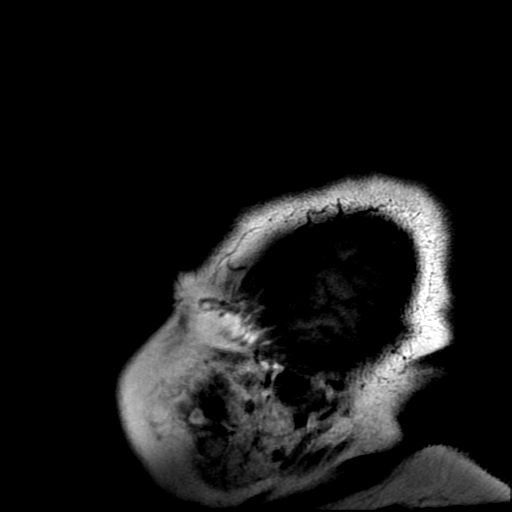
[im 21/21]
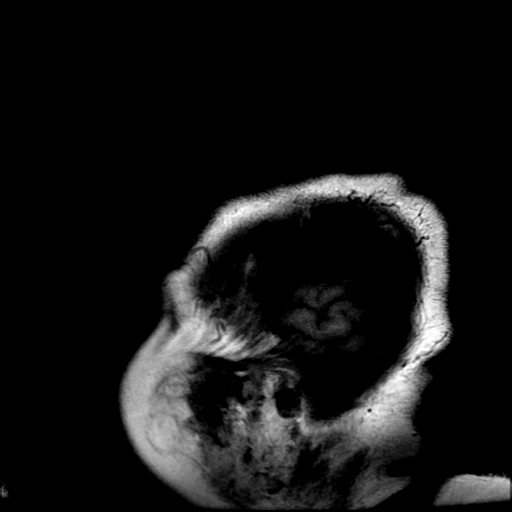

[Series 11: T2 · coronal · 5.0mm · 0.43mm/px · 3 of 27 slices shown (2 of 2)]
[im 1/27]
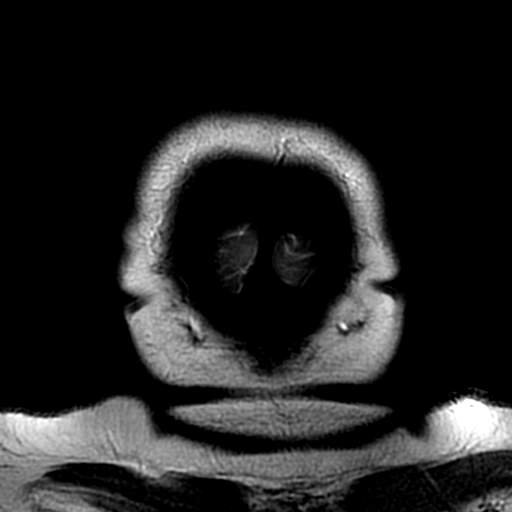
[im 14/27]
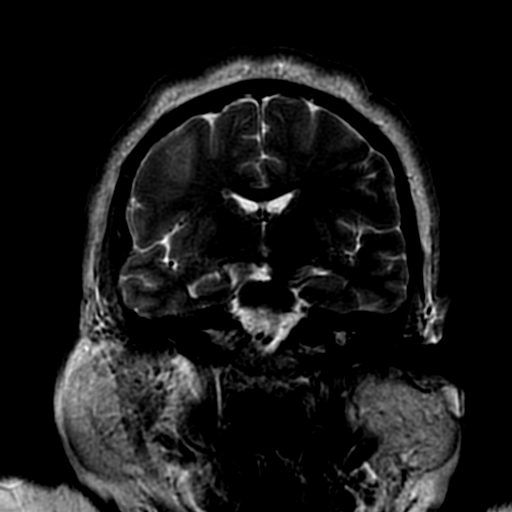
[im 27/27]
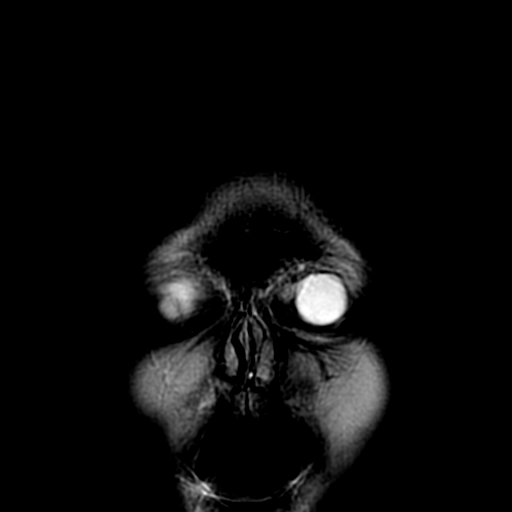

[Series 350: ADC · axial · 3.0mm · 0.94mm/px · z∈[-71,+60]mm · 5 of 50 slices shown (1 of 2)]
[im 1/50]
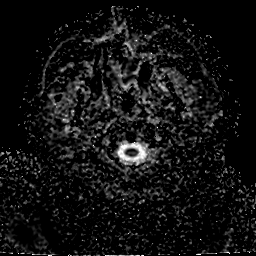
[im 13/50]
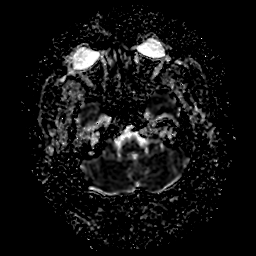
[im 25/50]
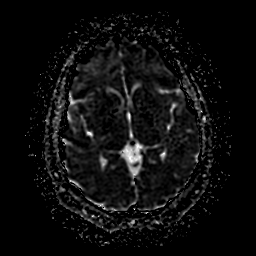
[im 37/50]
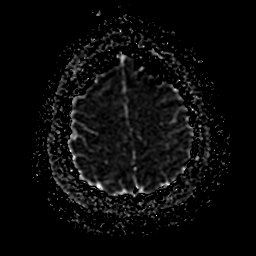
[im 50/50]
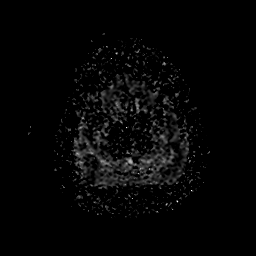

[Series 550: ADC · coronal · 4.0mm · 0.94mm/px · 3 of 32 slices shown (2 of 2)]
[im 1/32]
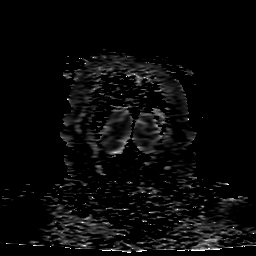
[im 16/32]
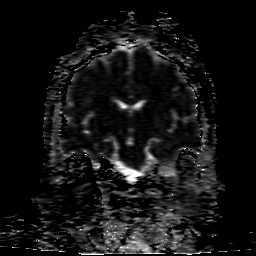
[im 32/32]
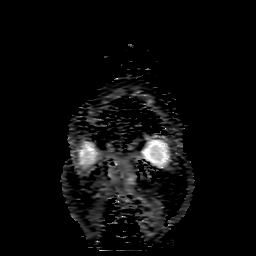

[35 of 48 positions shown; findings below may reference images not displayed]

FINDINGS: Brain: Generalized age related cerebral atrophy. Mild patchy
T2/FLAIR hyperintensity within the periventricular and deep white
matter both cerebral hemispheres, nonspecific, but most like related
chronic small vessel ischemic changes, mild for age. Few small
remote lacunar infarcts present within the right paramedian pons.

No abnormal foci of restricted diffusion to suggest acute or
subacute ischemia. Gray-white matter differentiation maintained. No
other areas of remote cortical infarction. No evidence for acute or
chronic intracranial hemorrhage.

No mass lesion, midline shift or mass effect. No hydrocephalus. No
extra-axial fluid collection.

Pituitary gland suprasellar region normal. Midline structures intact
and normal.

Vascular: Major intracranial vascular flow voids are maintained at
the skullbase. Left vertebral artery tortuous and invaginate TSEGA
upon the medulla, stable from previous.

Skull and upper cervical spine: Craniocervical junction within
normal limits. Upper cervical spine normal. Bone marrow signal
intensity within normal limits. No scalp soft tissue abnormality.

Sinuses/Orbits: Globes and orbital soft tissues within normal
limits. Patient status post cataract extraction bilaterally.
Paranasal sinuses are clear. No significant mastoid effusion. Inner
ear structures normal.

Other: None.
IMPRESSION: 1. No acute intracranial abnormality.
2. Few small remote right pontine lacunar infarcts.
3. Minimal chronic small vessel ischemic changes for age.

## 2019-02-20 ENCOUNTER — Ambulatory Visit: Payer: BC Managed Care – PPO | Admitting: Physical Therapy

## 2019-02-23 ENCOUNTER — Encounter: Payer: Self-pay | Admitting: Occupational Therapy

## 2019-02-23 ENCOUNTER — Other Ambulatory Visit: Payer: Self-pay

## 2019-02-23 ENCOUNTER — Ambulatory Visit: Payer: BC Managed Care – PPO | Attending: Internal Medicine | Admitting: Occupational Therapy

## 2019-02-23 DIAGNOSIS — M6281 Muscle weakness (generalized): Secondary | ICD-10-CM | POA: Insufficient documentation

## 2019-02-23 DIAGNOSIS — R2681 Unsteadiness on feet: Secondary | ICD-10-CM | POA: Diagnosis not present

## 2019-02-23 DIAGNOSIS — R278 Other lack of coordination: Secondary | ICD-10-CM | POA: Diagnosis present

## 2019-02-23 DIAGNOSIS — I69351 Hemiplegia and hemiparesis following cerebral infarction affecting right dominant side: Secondary | ICD-10-CM | POA: Diagnosis present

## 2019-02-23 DIAGNOSIS — R293 Abnormal posture: Secondary | ICD-10-CM | POA: Diagnosis present

## 2019-02-23 NOTE — Therapy (Signed)
[facility] [address] [place], [place], [address] Phone: [phone]   Fax:  [phone]  Occupational Therapy Treatment  Patient Details  Name: Rebekah Kennedy MRN: [id number] Date of Birth: [birth year] Referring Provider (OT): Dr. Billey Gosling   Encounter Date: 02/23/2019  OT End of Session - 02/23/19 1853    Visit Number  13    Number of Visits  16    Date for OT Re-Evaluation  03/16/19    Authorization Type  [employer] [place]    OT Start Time  6712   pt arrived late   OT Stop Time  1503    OT Time Calculation (min)  43 min    Activity Tolerance  Patient tolerated treatment well       Past Medical History:  Diagnosis Date  . ASTHMA NOS W/ACUTE EXACERBATION 07/10/2010  . Cataract 2010   surgery  . Cervical dysplasia   . DEPRESSION 03/16/2007  . DIABETES MELLITUS, TYPE II 12/01/2006   dx 2004  . Dysmenorrhea   . Fibroid   . HYPERLIPIDEMIA 12/01/2006  . HYPERTENSION 07/07/2006  . Neuropathy   . Obesity   . Osteopenia 05/2017   T score -1.5 FRAX 2.6% / 0.1%  . Retinal edema    gets steriod inj in the eyes    . Sleep apnea    HAS CPAP BUT ADMITS DOES NOT USE JUDICIALLY   . Splenomegaly    in college  . Stroke (cerebrum) ([place]) 2020   left side pt states  . Stroke [facility]) 2012   R pontine, mild residual left hemiparesis  . TIA (transient ischemic attack) 2012   2 TIAs 1 week apart of each other     Past Surgical History:  Procedure Laterality Date  . 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 APPENDECTOMY    . BIOPSY THYROID  05/02/11   Nonneoplastic goiter  . BREAST BIOPSY    . BREAST LUMPECTOMY WITH RADIOACTIVE SEED LOCALIZATION Left 07/25/2017   Procedure: LEFT BREAST LUMPECTOMY WITH RADIOACTIVE SEED LOCALIZATION;  Surgeon: Coralie Keens, MD;  Location: [facility];  Service: General;  Laterality: Left;  . BREAST SURGERY     Reduction  . COLONOSCOPY    . COLPOSCOPY    . DILATION AND CURETTAGE OF UTERUS  1975    DUB  . EYE SURGERY     Laser  . GASTRIC ROUX-EN-Y N/A 08/12/2017   Procedure: LAPAROSCOPIC ROUX-EN-Y GASTRIC BYPASS WITH HIATAL HERNIA REPAIR AND UPPER ENDOSCOPY;  Surgeon: [name], [name], MD;  [facility];  Service: General;  Laterality: N/A;  . GYNECOLOGIC CRYOSURGERY    . MYOMECTOMY    . OVARIAN CYST REMOVAL    . PELVIC LAPAROSCOPY  75,88   DL lysis of adhesions  . STOMACH SURGERY      There were no vitals filed for this visit.  Subjective Assessment - 02/23/19 1844    Subjective   I have been spending a few hours alone at my own house - I want to be able to live alone again.    Pertinent History  Pt with L thalamic stroke in Feb 2020 as well as R pontine stroke in 2012.  HTN, Diabetes, HLD, depression.    Patient Stated Goals  My R hand sometimes does its own thing and I want to be able to write better.          Patient seen for aquatic therapy today.  Treatment took place in water  2.5-4 feet deep depending upon activity.  Pt entered the pool via steps using 2 hand railings and distant supervision.  Addressed functional ambulation in open water with no UE support for the first time today - pt initially fearful but with practice and repetition became more confident.  Addressed dynamic standing balance using Unpredictable Command Technique for stops, starts, head turns and head nods. Pt with increased difficulty with looking up and sudden stops.  Also utilized this approach for forward and sudden backward steps to simulate opening a door, opening the refrigerator door and opening car door. IInitially pt required steadying assist but with practice and repetition able to eventually complete in open water with no UE support with only supervision.  Also addressed dynamic standing balance using backward walking entire pool length without UE support -pt required supervision. Addressed UE strengthening and core strengthening using light dumb bells in sitting for chest presses,  horizontal ab/adduction 10 reps x2. Pt required mod assist and max cues to avoid shoulder hiking with all activities however improved to needing only min a and intermittent cueing with repetition.  Also addressed sit to stand and stand to sit without UE's with focus on full weight shift forward and slowed controlled movements - pt initially lost balance posteriorly however with practice could complete with close supervision and min cues.  Pt's activity tolerance is improving and pt requires fewer rest breaks during the session. Pt exited the pool via steps using 2 hand railings and distant supervision.                      OT Short Term Goals - 02/23/19 1851      OT SHORT TERM GOAL #1   Title  Pt will be mod I for RUE coordination HEP ( 01/07/19 goal date adjusted as pt will be on hold for first two weeks of August due to scheduling)    Status  Achieved      OT SHORT TERM GOAL #2   Title  Pt will demonstrate improved coordination as evidenced by decreasing time on 9 hole peg by at least 4 seconds to assist with fine motor tasks    Baseline  31.93    Status  Achieved   01/07/2019  23.99     OT SHORT TERM GOAL #3   Title  Pt will report greater ease in using RUE to brush teeth    Status  Achieved   Pt reports much greater control when brushing her teeth       OT Long Term Goals - 02/23/19 1851      OT LONG TERM GOAL #1   Title  Pt will be mod I with upgraded HEP for functional use of RUE/balance/activity tolerance - 03/16/2019 (renewed on 02/02/2019 as pt had only had 10 visits in 8 weeks of therapy)    Status  On-going      OT LONG TERM GOAL #2   Title  Pt will demonstrate improved RUE coordination as evidenced by decreasing time on 9 hole peg test by at least 6 seconds to assist with fine motor tasks    Baseline  31.93    Status  Achieved   01/07/2019 23.99     OT LONG TERM GOAL #3   Title  Pt will demonstrate ability to use RUE to pick up, drink and set cup down  without spilling using RUE.    Status  Partially Met      OT LONG TERM GOAL #  4   Title  Pt will be able to write 3 sentence paragraph legibly, AE prn    Status  Achieved      OT LONG TERM GOAL #5   Title  Pt will demonstrate ability to walk outside on uneven ground mod I for return to leisure and work related activities    Status  On-going      OT LONG TERM GOAL #6   Title  Pt will demonstrate ability to complete simple shopping task mod I    Status  On-going            Plan - 02/23/19 1852    Clinical Impression Statement  Pt continues to progress toward goals. Pt states her confidence is growing related to her functional mobility and that overall she feels her RUE is much more functional.    OT Occupational Profile and History  Detailed Assessment- Review of Records and additional review of physical, cognitive, psychosocial history related to current functional performance    Occupational performance deficits (Please refer to evaluation for details):  ADL's;IADL's;Work;Leisure;Social Participation    Body Structure / Function / Physical Skills  ADL;Balance;Coordination;FMC;GMC;IADL;Muscle spasms;Tone;UE functional use    Rehab Potential  Good    Clinical Decision Making  Several treatment options, min-mod task modification necessary    Comorbidities Affecting Occupational Performance:  May have comorbidities impacting occupational performance    Modification or Assistance to Complete Evaluation   Min-Moderate modification of tasks or assist with assess necessary to complete eval    OT Frequency  1x / week    OT Duration  6 weeks    OT Treatment/Interventions  Self-care/ADL training;Aquatic Therapy;DME and/or AE instruction;Neuromuscular education;Therapeutic exercise;Functional Mobility Training;Manual Therapy;Therapeutic activities;Patient/family education;Balance training    Plan  aquatic therapy 1x/wk to address dynamic standing balance, postural control, trunk control, LE  strengthening and functional ambulation.    Consulted and Agree with Plan of Care  Patient       Patient will benefit from skilled therapeutic intervention in order to improve the following deficits and impairments:   Body Structure / Function / Physical Skills: ADL, Balance, Coordination, FMC, GMC, IADL, Muscle spasms, Tone, UE functional use       Visit Diagnosis: Unsteadiness on feet  Muscle weakness (generalized)  Abnormal posture  Hemiplegia and hemiparesis following cerebral infarction affecting right dominant side (HCC)  Other lack of coordination    Problem List Patient Active Problem List   Diagnosis Date Noted  . Dermatitis of eyelid, contact or allergic 07/31/2018  . Right sided weakness 07/14/2018  . Left thalamic infarction ([place]) 07/12/2018  . Carotid artery disease ([place]) 07/09/2018  . Numbness and tingling 12/17/2017  . Right foot drop 12/17/2017  . Atherosclerosis of aorta ([place]) 10/09/2017  . Bariatric surgery status, 07/2017 08/19/2017  . Constipation 08/19/2017  . Hypertensive urgency 05/23/2017  . Greater trochanteric bursitis of both hips 12/19/2016  . OSA (obstructive sleep apnea) 07/26/2016  . Hypersomnia 06/11/2016  . Diabetes ([place]) 07/19/2015  . History of colonic polyps 12/01/2014  . Morbidly obese ([place]) 10/05/2013  . Partial nontraumatic tear of right rotator cuff 09/21/2013  . Goiter 12/10/2011  . Asthma 12/10/2011  . 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 History of TIA (transient ischemic attack) 02/13/2011  . PROTEINURIA, MILD 09/15/2009  . Hyperlipidemia 12/01/2006  . Essential hypertension 07/07/2006    Quay Burow, OTR/L 02/23/2019, 6:57 PM  [facility] [address] [place], [place], [address] Phone:  [phone]   Fax:  [phone]  Name: Rebekah Kennedy MRN: [id number] Date of Birth: 03/21/1956

## 2019-03-02 ENCOUNTER — Encounter: Payer: Self-pay | Admitting: Occupational Therapy

## 2019-03-02 ENCOUNTER — Ambulatory Visit: Payer: BC Managed Care – PPO | Admitting: Occupational Therapy

## 2019-03-02 ENCOUNTER — Other Ambulatory Visit: Payer: Self-pay

## 2019-03-02 DIAGNOSIS — M6281 Muscle weakness (generalized): Secondary | ICD-10-CM

## 2019-03-02 DIAGNOSIS — R278 Other lack of coordination: Secondary | ICD-10-CM

## 2019-03-02 DIAGNOSIS — R2681 Unsteadiness on feet: Secondary | ICD-10-CM | POA: Diagnosis not present

## 2019-03-02 DIAGNOSIS — I69351 Hemiplegia and hemiparesis following cerebral infarction affecting right dominant side: Secondary | ICD-10-CM

## 2019-03-02 DIAGNOSIS — R293 Abnormal posture: Secondary | ICD-10-CM

## 2019-03-02 NOTE — Therapy (Signed)
[facility] [address] [place], [place], [address] Phone: [phone]   Fax:  [phone]  Occupational Therapy Treatment  Patient Details  Name: Rebekah Kennedy MRN: [id number] Date of Birth: July 29, 1955 Referring Provider (OT): Dr. Billey Gosling   Encounter Date: 03/02/2019  OT End of Session - 03/02/19 1833    Visit Number  14    Number of Visits  16    Date for OT Re-Evaluation  03/16/19    Authorization Type  [employer] [place]    OT Start Time  1416    OT Stop Time  1459    OT Time Calculation (min)  43 min    Activity Tolerance  Patient tolerated treatment well       Past Medical History:  Diagnosis Date  . ASTHMA NOS W/ACUTE EXACERBATION 07/10/2010  . Cataract 2010   surgery  . Cervical dysplasia   . DEPRESSION 03/16/2007  . DIABETES MELLITUS, TYPE II 12/01/2006   dx 2004  . Dysmenorrhea   . Fibroid   . HYPERLIPIDEMIA 12/01/2006  . HYPERTENSION 07/07/2006  . Neuropathy   . Obesity   . Osteopenia 05/2017   T score -1.5 FRAX 2.6% / 0.1%  . Retinal edema    gets steriod inj in the eyes    . Sleep apnea    HAS CPAP BUT ADMITS DOES NOT USE JUDICIALLY   . Splenomegaly    in college  . Stroke (cerebrum) ([place]) 2020   left side pt states  . Stroke [facility]) 2012   R pontine, mild residual left hemiparesis  . TIA (transient ischemic attack) 2012   2 TIAs 1 week apart of each other     Past Surgical History:  Procedure Laterality Date  . 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 APPENDECTOMY    . BIOPSY THYROID  05/02/11   Nonneoplastic goiter  . BREAST BIOPSY    . BREAST LUMPECTOMY WITH RADIOACTIVE SEED LOCALIZATION Left 07/25/2017   Procedure: LEFT BREAST LUMPECTOMY WITH RADIOACTIVE SEED LOCALIZATION;  Surgeon: Coralie Keens, MD;  Location: [facility];  Service: General;  Laterality: Left;  . BREAST SURGERY     Reduction  . COLONOSCOPY    . COLPOSCOPY    . DILATION AND CURETTAGE OF UTERUS  1975   DUB  . EYE  SURGERY     Laser  . GASTRIC ROUX-EN-Y N/A 08/12/2017   Procedure: LAPAROSCOPIC ROUX-EN-Y GASTRIC BYPASS WITH HIATAL HERNIA REPAIR AND UPPER ENDOSCOPY;  Surgeon: [name], [name], MD;  Location: [facility];  Service: General;  Laterality: N/A;  . GYNECOLOGIC CRYOSURGERY    . MYOMECTOMY    . OVARIAN CYST REMOVAL    . PELVIC LAPAROSCOPY  75,88   DL lysis of adhesions  . STOMACH SURGERY      There were no vitals filed for this visit.  Subjective Assessment - 03/02/19 1831    Subjective   I am spending a few hours almost every day at my house by myself.  I told my daughter at some point I want to move back to my own house.    Pertinent History  Pt with L thalamic stroke in Feb 2020 as well as R pontine stroke in 2012.  HTN, Diabetes, HLD, depression.    Patient Stated Goals  My R hand sometimes does its own thing and I want to be able to write better.    Currently in Pain?  No/denies       Patient seen  for aquatic therapy today.  Treatment took place in water 2.5-4 feet deep depending upon activity.  Pt entered the pool via steps using 2 hand railings and distant supervision.  Utilized functional ambulation in open water without UE support entire length of pool x4 as warm up and to address dynamic standing balance, LE strengthening and activity tolerance.  Pt reports that she has difficulty maintaining her balance when she parks her car at home as she has to park on the street and the street is a hill. After discussion, pt reports she has difficulty stepping backward when on a hill to clear the car door. First addressed backward walking in open water without UE support on level surfaces. Then progressed to walking first forward and then backward on ramp without using railings. Initially pt needed intermittent contact guard for forward walking on ramp and min a for backward walking. With practice and repetition, pt able to complete forward walking on ramp with close supervision and backward walking  with intermittent steadying assist. Will continue to address this in future sessions.  Addressed normalizing gait speed in order to decrease fall risk with hand held assist to set pace for pt - pt's ability to maintain COG over BOS improves with practice and repetition.  Also addressed LE strengthening using flotation devices on ankles for hip ab/adduction and hip extension 15 reps x2.  Addressed UE strengthening using light dumb bells for chest presses and horizontal ab/adduction with dumb bells completely submerged and pt standing with back supported on pool wall 10 reps x2 of each with cues to avoid shoulder hiking.  Addressed side stepping in open water using floatation devices to address LE strengthening as well as dynamic standing balance and normalization of speed - pt needed steadying assist.  Pt exited the pool via steps using 2 hand railings and distant supervision.                        OT Short Term Goals - 03/02/19 1832      OT SHORT TERM GOAL #1   Title  Pt will be mod I for RUE coordination HEP ( 01/07/19 goal date adjusted as pt will be on hold for first two weeks of August due to scheduling)    Status  Achieved      OT SHORT TERM GOAL #2   Title  Pt will demonstrate improved coordination as evidenced by decreasing time on 9 hole peg by at least 4 seconds to assist with fine motor tasks    Baseline  31.93    Status  Achieved   01/07/2019  23.99     OT SHORT TERM GOAL #3   Title  Pt will report greater ease in using RUE to brush teeth    Status  Achieved   Pt reports much greater control when brushing her teeth       OT Long Term Goals - 03/02/19 1832      OT LONG TERM GOAL #1   Title  Pt will be mod I with upgraded HEP for functional use of RUE/balance/activity tolerance - 03/16/2019 (renewed on 02/02/2019 as pt had only had 10 visits in 8 weeks of therapy)    Status  On-going      OT LONG TERM GOAL #2   Title  Pt will demonstrate improved RUE  coordination as evidenced by decreasing time on 9 hole peg test by at least 6 seconds to assist with fine motor tasks  Baseline  31.93    Status  Achieved   01/07/2019 23.99     OT LONG TERM GOAL #3   Title  Pt will demonstrate ability to use RUE to pick up, drink and set cup down without spilling using RUE.    Status  Partially Met      OT LONG TERM GOAL #4   Title  Pt will be able to write 3 sentence paragraph legibly, AE prn    Status  Achieved      OT LONG TERM GOAL #5   Title  Pt will demonstrate ability to walk outside on uneven ground mod I for return to leisure and work related activities    Status  On-going      OT LONG TERM GOAL #6   Title  Pt will demonstrate ability to complete simple shopping task mod I    Status  On-going            Plan - 03/02/19 1832    Clinical Impression Statement  Pt progressing toward goals. Pt spending time by herself a few hours in the day in her own home (pt currently living with dtr).    OT Occupational Profile and History  Detailed Assessment- Review of Records and additional review of physical, cognitive, psychosocial history related to current functional performance    Occupational performance deficits (Please refer to evaluation for details):  ADL's;IADL's;Work;Leisure;Social Participation    Body Structure / Function / Physical Skills  ADL;Balance;Coordination;FMC;GMC;IADL;Muscle spasms;Tone;UE functional use    Rehab Potential  Good    Clinical Decision Making  Several treatment options, min-mod task modification necessary    Comorbidities Affecting Occupational Performance:  May have comorbidities impacting occupational performance    Modification or Assistance to Complete Evaluation   Min-Moderate modification of tasks or assist with assess necessary to complete eval    OT Frequency  1x / week    OT Duration  6 weeks    OT Treatment/Interventions  Self-care/ADL training;Aquatic Therapy;DME and/or AE instruction;Neuromuscular  education;Therapeutic exercise;Functional Mobility Training;Manual Therapy;Therapeutic activities;Patient/family education;Balance training    Plan  aquatic therapy 1x/wk to address dynamic standing balance, postural control, trunk control, LE strengthening and functional ambulation.    Consulted and Agree with Plan of Care  Patient       Patient will benefit from skilled therapeutic intervention in order to improve the following deficits and impairments:   Body Structure / Function / Physical Skills: ADL, Balance, Coordination, FMC, GMC, IADL, Muscle spasms, Tone, UE functional use       Visit Diagnosis: Unsteadiness on feet  Muscle weakness (generalized)  Abnormal posture  Hemiplegia and hemiparesis following cerebral infarction affecting right dominant side (HCC)  Other lack of coordination    Problem List Patient Active Problem List   Diagnosis Date Noted  . Dermatitis of eyelid, contact or allergic 07/31/2018  . Right sided weakness 07/14/2018  . Left thalamic infarction ([place]) 07/12/2018  . Carotid artery disease ([place]) 07/09/2018  . Numbness and tingling 12/17/2017  . Right foot drop 12/17/2017  . Atherosclerosis of aorta ([place]) 10/09/2017  . Bariatric surgery status, 07/2017 08/19/2017  . Constipation 08/19/2017  . Hypertensive urgency 05/23/2017  . Greater trochanteric bursitis of both hips 12/19/2016  . OSA (obstructive sleep apnea) 07/26/2016  . Hypersomnia 06/11/2016  . Diabetes ([place]) 07/19/2015  . History of colonic polyps 12/01/2014  . Morbidly obese ([place]) 10/05/2013  . Partial nontraumatic tear of right rotator cuff 09/21/2013  . Goiter 12/10/2011  . Asthma 12/10/2011  .  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 History of TIA (transient ischemic attack) 02/13/2011  . PROTEINURIA, MILD 09/15/2009  . Hyperlipidemia 12/01/2006  . Essential hypertension 07/07/2006    Quay Burow, OTR/L 03/02/2019, 6:34 PM  [facility] [address] [place], [place], [address] Phone: [phone]   Fax:  [phone]  Name: KAMISHA ELL MRN: [id number] Date of Birth: [birth year]

## 2019-03-04 ENCOUNTER — Encounter (HOSPITAL_COMMUNITY): Payer: Self-pay

## 2019-03-09 ENCOUNTER — Encounter: Payer: BC Managed Care – PPO | Admitting: Occupational Therapy

## 2019-03-16 ENCOUNTER — Ambulatory Visit: Payer: BC Managed Care – PPO | Admitting: Occupational Therapy

## 2019-03-23 ENCOUNTER — Ambulatory Visit: Payer: BC Managed Care – PPO | Attending: Internal Medicine | Admitting: Occupational Therapy

## 2019-03-23 ENCOUNTER — Other Ambulatory Visit: Payer: Self-pay

## 2019-03-23 ENCOUNTER — Encounter: Payer: Self-pay | Admitting: Occupational Therapy

## 2019-03-23 DIAGNOSIS — R278 Other lack of coordination: Secondary | ICD-10-CM | POA: Insufficient documentation

## 2019-03-23 DIAGNOSIS — M6281 Muscle weakness (generalized): Secondary | ICD-10-CM | POA: Diagnosis present

## 2019-03-23 DIAGNOSIS — R2681 Unsteadiness on feet: Secondary | ICD-10-CM | POA: Insufficient documentation

## 2019-03-23 DIAGNOSIS — I69351 Hemiplegia and hemiparesis following cerebral infarction affecting right dominant side: Secondary | ICD-10-CM | POA: Diagnosis present

## 2019-03-23 DIAGNOSIS — R293 Abnormal posture: Secondary | ICD-10-CM | POA: Insufficient documentation

## 2019-03-23 NOTE — Therapy (Signed)
[facility] [address] [place], [place], [address] Phone: [phone]   Fax:  [phone]  Occupational Therapy Treatment  Patient Details  Name: Rebekah Kennedy MRN: [id number] Date of Birth: [birth year] Referring Provider (OT): Dr. Billey Gosling   Encounter Date: 03/23/2019  OT End of Session - 03/23/19 1744    Visit Number  15    Number of Visits  18    Date for OT Re-Evaluation  04/20/19    Authorization Type  [employer] [place]    OT Start Time  1418    OT Stop Time  1502    OT Time Calculation (min)  44 min    Activity Tolerance  Patient tolerated treatment well       Past Medical History:  Diagnosis Date  . ASTHMA NOS W/ACUTE EXACERBATION 07/10/2010  . Cataract 2010   surgery  . Cervical dysplasia   . DEPRESSION 03/16/2007  . DIABETES MELLITUS, TYPE II 12/01/2006   dx 2004  . Dysmenorrhea   . Fibroid   . HYPERLIPIDEMIA 12/01/2006  . HYPERTENSION 07/07/2006  . Neuropathy   . Obesity   . Osteopenia 05/2017   T score -1.5 FRAX 2.6% / 0.1%  . Retinal edema    gets steriod inj in the eyes    . Sleep apnea    HAS CPAP BUT ADMITS DOES NOT USE JUDICIALLY   . Splenomegaly    in college  . Stroke (cerebrum) ([place]) 2020   left side pt states  . Stroke [facility]) 2012   R pontine, mild residual left hemiparesis  . TIA (transient ischemic attack) 2012   2 TIAs 1 week apart of each other     Past Surgical History:  Procedure Laterality Date  . 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 APPENDECTOMY    . BIOPSY THYROID  05/02/11   Nonneoplastic goiter  . BREAST BIOPSY    . BREAST LUMPECTOMY WITH RADIOACTIVE SEED LOCALIZATION Left 07/25/2017   Procedure: LEFT BREAST LUMPECTOMY WITH RADIOACTIVE SEED LOCALIZATION;  Surgeon: Coralie Keens, MD;  Location: [facility];  Service: General;  Laterality: Left;  . BREAST SURGERY     Reduction  . COLONOSCOPY    . COLPOSCOPY    . DILATION AND CURETTAGE OF UTERUS  1975   DUB  . EYE  SURGERY     Laser  . GASTRIC ROUX-EN-Y N/A 08/12/2017   Procedure: LAPAROSCOPIC ROUX-EN-Y GASTRIC BYPASS WITH HIATAL HERNIA REPAIR AND UPPER ENDOSCOPY;  Surgeon: [name], [name], MD;  Location: [facility];  Service: General;  Laterality: N/A;  . GYNECOLOGIC CRYOSURGERY    . MYOMECTOMY    . OVARIAN CYST REMOVAL    . PELVIC LAPAROSCOPY  75,88   DL lysis of adhesions  . STOMACH SURGERY      There were no vitals filed for this visit.  Subjective Assessment - 03/23/19 1740    Subjective   So my daughter can come next time to learn what I am doing?    Pertinent History  Pt with L thalamic stroke in Feb 2020 as well as R pontine stroke in 2012.  HTN, Diabetes, HLD, depression.    Patient Stated Goals  My R hand sometimes does its own thing and I want to be able to write better.    Currently in Pain?  No/denies       Patient seen for aquatic therapy today.  Treatment took place in water 2.5-4 feet deep depending upon activity.  Pt  entered the pool via steps using 2 hand railings and mod I. Initiated education for aquatic HEP - Addressed functional ambulation in open water without UE support with emphasis on increasing speed to decrease risk of falls as well as to increase activity demand and for LE resistance for strengthening. Pt able to complete with distant supervision and therapist providing pacing (4 lengths of pool).  Also addressed LE strengthening using tan band for hip abduction, hip extension (15 reps x2 each ), Utilized large noodle today to address single leg marching (R then L) with focus on coordinated smooth movement, LE strengthening and postural alignment and control. Also addressed hip IR/ER using noodle with hip and knee flexion at 90*. For all LE strengthening pt with BUE support on pool wall for stability - pt needs moderate cues for postural alignment with activities to isolate muscle groups and to focus on core stability and control. Also addressed UE strengthening using light  dumb bells for chest presses as well as horizontal ab/adduction ( 15 reps x2 each). Pt positioned with back supported by pool wall to allow for increased resistance to UE's.  Pt needs cues for correct technique. Will review with dtr and pt next session and will also incorporate balance activities into next session for HEP. Pt reports dtr is willing to get into the water with pt to assist with HEP.                       OT Education - 03/23/19 1741    Education Details  initiated aquatic HEP education    Person(s) Educated  Patient    Methods  Explanation;Demonstration;Verbal cues    Comprehension  Verbalized understanding;Need further instruction;Returned demonstration       OT Short Term Goals - 03/23/19 1741      OT SHORT TERM GOAL #1   Title  Pt will be mod I for RUE coordination HEP ( 01/07/19 goal date adjusted as pt will be on hold for first two weeks of August due to scheduling)    Status  Achieved      OT SHORT TERM GOAL #2   Title  Pt will demonstrate improved coordination as evidenced by decreasing time on 9 hole peg by at least 4 seconds to assist with fine motor tasks    Baseline  31.93    Status  Achieved   01/07/2019  23.99     OT SHORT TERM GOAL #3   Title  Pt will report greater ease in using RUE to brush teeth    Status  Achieved   Pt reports much greater control when brushing her teeth       OT Long Term Goals - 03/23/19 1741      OT LONG TERM GOAL #1   Title  Pt will be mod I with upgraded HEP for functional use of RUE/balance/activity tolerance - 04/20/2019 (renewed on 03/23/2019 as pt missed appt and will need further sessions for HEP education)    Status  On-going      OT LONG TERM GOAL #2   Title  Pt will demonstrate improved RUE coordination as evidenced by decreasing time on 9 hole peg test by at least 6 seconds to assist with fine motor tasks    Baseline  31.93    Status  Achieved   01/07/2019 23.99     OT LONG TERM GOAL #3   Title   Pt will demonstrate ability to use RUE to pick up,  drink and set cup down without spilling using RUE.    Status  Partially Met      OT LONG TERM GOAL #4   Title  Pt will be able to write 3 sentence paragraph legibly, AE prn    Status  Achieved      OT LONG TERM GOAL #5   Title  Pt will demonstrate ability to walk outside on uneven ground mod I for return to leisure and work related activities    Status  Achieved      OT LONG TERM GOAL #6   Title  Pt will demonstrate ability to complete simple shopping task mod I    Status  Achieved            Plan - 03/23/19 1743    Clinical Impression Statement  Pt missed last session due to not feeling well. Initiated education for aquatic HEP and will continue next session with pt and daughter.    OT Occupational Profile and History  Detailed Assessment- Review of Records and additional review of physical, cognitive, psychosocial history related to current functional performance    Occupational performance deficits (Please refer to evaluation for details):  ADL's;IADL's;Work;Leisure;Social Participation    Body Structure / Function / Physical Skills  ADL;Balance;Coordination;FMC;GMC;IADL;Muscle spasms;Tone;UE functional use    Rehab Potential  Good    Clinical Decision Making  Several treatment options, min-mod task modification necessary    Comorbidities Affecting Occupational Performance:  May have comorbidities impacting occupational performance    Modification or Assistance to Complete Evaluation   Min-Moderate modification of tasks or assist with assess necessary to complete eval    OT Frequency  1x / week    OT Duration  4 weeks    OT Treatment/Interventions  Self-care/ADL training;Aquatic Therapy;DME and/or AE instruction;Neuromuscular education;Therapeutic exercise;Functional Mobility Training;Manual Therapy;Therapeutic activities;Patient/family education;Balance training    Plan  aquatic therapy 1x/wk to address education with pt and  daughter for aquatic HEP    Consulted and Agree with Plan of Care  Patient       Patient will benefit from skilled therapeutic intervention in order to improve the following deficits and impairments:   Body Structure / Function / Physical Skills: ADL, Balance, Coordination, FMC, GMC, IADL, Muscle spasms, Tone, UE functional use       Visit Diagnosis: Unsteadiness on feet  Muscle weakness (generalized)  Abnormal posture  Hemiplegia and hemiparesis following cerebral infarction affecting right dominant side (HCC)  Other lack of coordination    Problem List Patient Active Problem List   Diagnosis Date Noted  . Dermatitis of eyelid, contact or allergic 07/31/2018  . Right sided weakness 07/14/2018  . Left thalamic infarction ([place]) 07/12/2018  . Carotid artery disease ([place]) 07/09/2018  . Numbness and tingling 12/17/2017  . Right foot drop 12/17/2017  . Atherosclerosis of aorta ([place]) 10/09/2017  . Bariatric surgery status, 07/2017 08/19/2017  . Constipation 08/19/2017  . Hypertensive urgency 05/23/2017  . Greater trochanteric bursitis of both hips 12/19/2016  . OSA (obstructive sleep apnea) 07/26/2016  . Hypersomnia 06/11/2016  . Diabetes ([place]) 07/19/2015  . History of colonic polyps 12/01/2014  . Morbidly obese ([place]) 10/05/2013  . Partial nontraumatic tear of right rotator cuff 09/21/2013  . Goiter 12/10/2011  . Asthma 12/10/2011  . 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 History of TIA (transient ischemic attack) 02/13/2011  . PROTEINURIA, MILD 09/15/2009  . Hyperlipidemia 12/01/2006  . Essential hypertension 07/07/2006    Quay Burow, OTR/L 03/23/2019, 5:46 PM  [facility] [address] [place], [place], [address] Phone: [phone]   Fax:  [phone]  Name: Rebekah Kennedy MRN: [id number] Date of Birth: 1955/11/24

## 2019-03-30 ENCOUNTER — Other Ambulatory Visit: Payer: Self-pay | Admitting: Internal Medicine

## 2019-04-06 ENCOUNTER — Ambulatory Visit: Payer: BC Managed Care – PPO | Admitting: Occupational Therapy

## 2019-04-10 ENCOUNTER — Other Ambulatory Visit: Payer: Self-pay

## 2019-04-10 ENCOUNTER — Ambulatory Visit: Payer: Self-pay

## 2019-04-10 ENCOUNTER — Encounter: Payer: Self-pay | Admitting: Internal Medicine

## 2019-04-10 ENCOUNTER — Ambulatory Visit (INDEPENDENT_AMBULATORY_CARE_PROVIDER_SITE_OTHER): Payer: BC Managed Care – PPO | Admitting: Internal Medicine

## 2019-04-10 VITALS — BP 180/100 | HR 70 | Temp 97.6°F | Ht 66.0 in | Wt 167.0 lb

## 2019-04-10 DIAGNOSIS — I10 Essential (primary) hypertension: Secondary | ICD-10-CM | POA: Diagnosis not present

## 2019-04-10 MED ORDER — AMLODIPINE BESYLATE 5 MG PO TABS: 5.0000 mg | ORAL_TABLET | Freq: Every day | ORAL | 1 refills | Status: DC

## 2019-04-10 MED ORDER — HYDRALAZINE HCL 10 MG PO TABS: 10.0000 mg | ORAL_TABLET | Freq: Three times a day (TID) | ORAL | 5 refills | Status: DC

## 2019-04-10 NOTE — Patient Instructions (Signed)
  Medications reviewed and updated.  Changes include :   Increase amlodipine to 5 mg daily.  Start hydralazine 10 mg three times a day.   Your prescription(s) have been submitted to your pharmacy. Please take as directed and contact our office if you believe you are having problem(s) with the medication(s).   Please followup on 12/4 as scheduled.

## 2019-04-10 NOTE — Assessment & Plan Note (Signed)
Blood pressure uncontrolled Stressed low-sodium diet Continue losartan 100 mg daily, metoprolol succinate 100 mg daily Increase amlodipine to 5 mg daily Start hydralazine 10 mg 3 times daily Advised her to monitor her blood pressure consistently and call with any concerns We have an appointment on 12/4 and we will keep that to recheck her blood pressure and do blood work at that time

## 2019-04-10 NOTE — Progress Notes (Signed)
Subjective:    Patient ID: Rebekah Kennedy, female    DOB: 1955/07/18, 63 y.o.   MRN: [id number]  HPI The patient is here for follow up.  On Monday she started a program through bariatric surgery center and has had her BP checked daily.  Her BP has been elevated - SBP 201 on Monday, 196/94 this morning.     Hypertension: She is taking her medication daily. She is not always compliant with a low sodium diet.  She denies chest pain, palpitations, edema, shortness of breath and regular headaches.  She feels completely fine and was surprised that her blood pressure was elevated.  She does not monitor her blood pressure at home regularly.     Medications and allergies reviewed with patient and updated if appropriate.  Patient Active Problem List   Diagnosis Date Noted  . Dermatitis of eyelid, contact or allergic 07/31/2018  . Right sided weakness 07/14/2018  . Left thalamic infarction ([place]) 07/12/2018  . Carotid artery disease ([place]) 07/09/2018  . Numbness and tingling 12/17/2017  . Right foot drop 12/17/2017  . Atherosclerosis of aorta ([place]) 10/09/2017  . Bariatric surgery status, 07/2017 08/19/2017  . Constipation 08/19/2017  . Hypertensive urgency 05/23/2017  . Greater trochanteric bursitis of both hips 12/19/2016  . OSA (obstructive sleep apnea) 07/26/2016  . Hypersomnia 06/11/2016  . Diabetes ([place]) 07/19/2015  . History of colonic polyps 12/01/2014  . Morbidly obese ([place]) 10/05/2013  . Partial nontraumatic tear of right rotator cuff 09/21/2013  . Goiter 12/10/2011  . Asthma 12/10/2011  . 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 History of TIA (transient ischemic attack) 02/13/2011  . PROTEINURIA, MILD 09/15/2009  . Hyperlipidemia 12/01/2006  . Essential hypertension 07/07/2006    Current Outpatient Medications on File Prior to Visit  Medication Sig Dispense Refill  . albuterol (PROVENTIL HFA;VENTOLIN HFA) 108 (90 Base) MCG/ACT inhaler Inhale 2 puffs into the lungs  every 4 (four) hours as needed for wheezing or shortness of breath. 18 g 2  . amLODipine (NORVASC) 2.5 MG tablet Take 1 tablet (2.5 mg total) by mouth daily. 30 tablet 5  . aspirin EC 81 MG EC tablet Take 1 tablet (81 mg total) by mouth daily. 30 tablet 0  . Biotin 5000 MCG TABS Take 5,000 mcg by mouth daily.     . calcium-vitamin D (OSCAL WITH D) 500-200 MG-UNIT tablet Take 1 tablet by mouth.    . clopidogrel (PLAVIX) 75 MG tablet TAKE 1 TABLET BY MOUTH EVERY DAY 90 tablet 1  . FARXIGA 5 MG TABS tablet TAKE 1 TABLET BY MOUTH EVERY DAY 90 tablet 0  . JANUVIA 100 MG tablet TAKE 1 TABLET BY MOUTH EVERY DAY 90 tablet 0  . losartan (COZAAR) 100 MG tablet TAKE 1 TAB BY MOUTH ONCE DAILY. 90 tablet 1  . metoprolol succinate (TOPROL-XL) 100 MG 24 hr tablet TAKE 1 TABLET BY MOUTH DAILY WITH OR IMMEDIATELY FOLLOWING A MEAL 90 tablet 1  . montelukast (SINGULAIR) 10 MG tablet TAKE 1 TABLET BY MOUTH EVERY DAY 90 tablet 1  . OVER THE COUNTER MEDICATION Take 1 tablet by mouth daily. BariMelt    . rosuvastatin (CRESTOR) 10 MG tablet TAKE 1 TABLET BY MOUTH EVERY DAY 90 tablet 3  . tacrolimus (PROTOPIC) 0.1 % ointment APPLY TO AFFECTED AREA TWICE A DAY (Patient taking differently: daily as needed. ) 30 g 0   No current facility-administered medications on file prior to visit.     Past Medical  History:  Diagnosis Date  . ASTHMA NOS W/ACUTE EXACERBATION 07/10/2010  . Cataract 2010   surgery  . Cervical dysplasia   . DEPRESSION 03/16/2007  . DIABETES MELLITUS, TYPE II 12/01/2006   dx 2004  . Dysmenorrhea   . Fibroid   . HYPERLIPIDEMIA 12/01/2006  . HYPERTENSION 07/07/2006  . Neuropathy   . Obesity   . Osteopenia 05/2017   T score -1.5 FRAX 2.6% / 0.1%  . Retinal edema    gets steriod inj in the eyes    . Sleep apnea    HAS CPAP BUT ADMITS DOES NOT USE JUDICIALLY   . Splenomegaly    in college  . Stroke (cerebrum) ([place]) 2020   left side pt states  . Stroke [facility]) 2012   R pontine, mild residual left  hemiparesis  . TIA (transient ischemic attack) 2012   2 TIAs 1 week apart of each other     Past Surgical History:  Procedure Laterality Date  . 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 APPENDECTOMY    . BIOPSY THYROID  05/02/11   Nonneoplastic goiter  . BREAST BIOPSY    . BREAST LUMPECTOMY WITH RADIOACTIVE SEED LOCALIZATION Left 07/25/2017   Procedure: LEFT BREAST LUMPECTOMY WITH RADIOACTIVE SEED LOCALIZATION;  Surgeon: Coralie Keens, MD;  Location: [place];  Service: General;  Laterality: Left;  . BREAST SURGERY     Reduction  . COLONOSCOPY    . COLPOSCOPY    . DILATION AND CURETTAGE OF UTERUS  1975   DUB  . EYE SURGERY     Laser  . GASTRIC ROUX-EN-Y N/A 08/12/2017   Procedure: LAPAROSCOPIC ROUX-EN-Y GASTRIC BYPASS WITH HIATAL HERNIA REPAIR AND UPPER ENDOSCOPY;  Surgeon: [name], [name], MD;  Location: [facility];  Service: General;  Laterality: N/A;  . GYNECOLOGIC CRYOSURGERY    . MYOMECTOMY    . OVARIAN CYST REMOVAL    . PELVIC LAPAROSCOPY  75,88   DL lysis of adhesions  . STOMACH SURGERY      Social History   Socioeconomic History  . Marital status: Single    Spouse name: Not on file  . Number of children: 2  . Years of education: 16  . Highest education level: Bachelor's degree (e.g., BA, AB, BS)  Occupational History  . Occupation: Product manager: [employer]  . Financial resource strain: Not on file  . Food insecurity    Worry: Not on file    Inability: Not on file  . Transportation needs    Medical: Not on file    Non-medical: Not on file  Tobacco Use  . Smoking status: Never Smoker  . Smokeless tobacco: Never Used  Substance and Sexual Activity  . Alcohol use: Yes    Alcohol/week: 0.0 standard drinks    Comment: very rare  . Drug use: No  . Sexual activity: Not Currently    Birth control/protection: Post-menopausal    Comment: 1st intercourse 63 yo-Fewer than 5 partners  Lifestyle  . Physical activity    Days per week:  Not on file    Minutes per session: Not on file  . Stress: Not on file  Relationships  . Social Herbalist on phone: Not on file    Gets together: Not on file    Attends religious service: Not on file    Active member of club or organization: Not on file    Attends meetings of clubs or organizations:  Not on file    Relationship status: Not on file  Other Topics Concern  . 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 Cancer Maternal Grandmother        Colon Cancer  . Asthma Maternal Grandmother   . Colon cancer Maternal Grandmother   . Diabetes Father   . Heart disease Father   . Hypertension Father   . Hyperlipidemia Father   . Hypertension Mother   . Heart disease Mother   . Ovarian cancer Mother   . Cancer Mother        Lung cancer  . Asthma Mother   . COPD Mother   . Hyperlipidemia Mother   . Diabetes Brother   . Hypertension Brother   . Kidney disease Brother   . Asthma Brother   . Heart disease Brother   . Hyperlipidemia Brother   . Cancer Brother        Prostate  . Colon polyps Brother   . Graves' disease Sister   . Diabetes Sister   . Breast cancer Sister 30  . Graves' disease Paternal Grandmother   . Hypertension Paternal Grandmother   . Heart disease Paternal Grandmother   . Alzheimer's disease Paternal Grandmother   . Cancer Maternal Grandfather   . Heart failure Brother   . Heart failure Brother   . Esophageal cancer Neg Hx   . Rectal cancer Neg Hx   . Stomach cancer Neg Hx     Review of Systems  Constitutional: Negative for fever.  Respiratory: Negative for shortness of breath.   Cardiovascular: Negative for chest pain, palpitations and leg swelling.  Neurological: Negative for dizziness, light-headedness and  headaches.       Objective:   Vitals:   04/10/19 1035  BP: (!) 180/100  Pulse: 70  Temp: 97.6 F (36.4 C)  SpO2: 98%   BP Readings from Last 3 Encounters:  04/10/19 (!) 180/100  02/09/19 112/62  01/23/19 (!) 158/72   Wt Readings from Last 3 Encounters:  04/10/19 167 lb (75.8 kg)  02/09/19 170 lb (77.1 kg)  01/23/19 166 lb (75.3 kg)   Body mass index is 26.95 kg/m.   Physical Exam    Constitutional: Appears well-developed and well-nourished. No distress.  HENT:  Head: Normocephalic and atraumatic.  Neck: Neck supple. No tracheal deviation present. No thyromegaly present.  No cervical lymphadenopathy Cardiovascular: Normal rate, regular rhythm and normal heart sounds.  2/6 systolic murmur heard. No carotid bruit .  No edema Pulmonary/Chest: Effort normal and breath sounds normal. No respiratory distress. No has no wheezes. No rales.  Skin: Skin is warm and dry. Not diaphoretic.  Psychiatric: Normal mood and affect. Behavior is normal.      Assessment & Plan:    See Problem List for Assessment and Plan of chronic medical problems.

## 2019-04-10 NOTE — Telephone Encounter (Signed)
Pt. reports she is in a bariatric program at [facility] and they have checked her BP x  2 mornings. Yesterday it was 201/94. Today 196/94. She has no symptoms. Requests a visit. Warm transfer to [facility] in the practice.  Reason for Disposition . Systolic BP  >= 99991111 OR Diastolic >= A999333  Answer Assessment - Initial Assessment Questions 1. BLOOD PRESSURE: "What is the blood pressure?" "Did you take at least two measurements 5 minutes apart?"     196/94 2. ONSET: "When did you take your blood pressure?"     0645 3. HOW: "How did you obtain the blood pressure?" (e.g., visiting nurse, automatic home BP monitor)     Manual cuff 4. HISTORY: "Do you have a history of high blood pressure?"     Yes 5. MEDICATIONS: "Are you taking any medications for blood pressure?" "Have you missed any doses recently?"     No missed doses 6. OTHER SYMPTOMS: "Do you have any symptoms?" (e.g., headache, chest pain, blurred vision, difficulty breathing, weakness)     No 7. PREGNANCY: "Is there any chance you are pregnant?" "When was your last menstrual period?"     No  Protocols used: HIGH BLOOD PRESSURE-A-AH

## 2019-04-10 NOTE — Telephone Encounter (Signed)
OV is scheduled today w/PCP @ 10:30.

## 2019-04-13 ENCOUNTER — Other Ambulatory Visit: Payer: Self-pay

## 2019-04-13 ENCOUNTER — Encounter: Payer: Self-pay | Admitting: Gynecology

## 2019-04-13 ENCOUNTER — Ambulatory Visit (INDEPENDENT_AMBULATORY_CARE_PROVIDER_SITE_OTHER): Payer: Self-pay | Admitting: Gynecology

## 2019-04-13 VITALS — BP 126/82 | Ht 65.0 in | Wt 165.0 lb

## 2019-04-13 DIAGNOSIS — Z01419 Encounter for gynecological examination (general) (routine) without abnormal findings: Secondary | ICD-10-CM

## 2019-04-13 DIAGNOSIS — M8580 Other specified disorders of bone density and structure, unspecified site: Secondary | ICD-10-CM

## 2019-04-13 DIAGNOSIS — N952 Postmenopausal atrophic vaginitis: Secondary | ICD-10-CM

## 2019-04-13 NOTE — Patient Instructions (Signed)
Follow-up in 1 year for annual exam, sooner if any issues. 

## 2019-04-13 NOTE — Progress Notes (Signed)
    Rebekah Kennedy 1956-03-23 [id number]        63 y.o.  G0P0 for annual gynecologic exam.  Without gynecologic complaints  Past medical history,surgical history, problem list, medications, allergies, family history and social history were all reviewed and documented as reviewed in the EPIC chart.  ROS:  Performed with pertinent positives and negatives included in the history, assessment and plan.   Additional significant findings : None   Exam: [name] assistant Vitals:   04/13/19 1523  BP: 126/82  Weight: 165 lb (74.8 kg)  Height: 5\' 5"  (1.651 m)   Body mass index is 27.46 kg/m.  General appearance:  Normal affect, orientation and appearance. Skin: Grossly normal HEENT: Without gross lesions.  No cervical or supraclavicular adenopathy. Thyroid normal.  Lungs:  Clear without wheezing, rales or rhonchi Cardiac: RR, without RMG Abdominal:  Soft, nontender, without masses, guarding, rebound, organomegaly or hernia Breasts:  Examined lying and sitting without masses, retractions, discharge or axillary adenopathy. Pelvic:  Ext, BUS, Vagina: With atrophic changes  Cervix: With atrophic changes  Uterus: Anteverted, normal size, shape and contour, midline and mobile nontender   Adnexa: Without masses or tenderness    Anus and perineum: Normal   Rectovaginal: Normal sphincter tone without palpated masses or tenderness.    Assessment/Plan:  63 y.o. G0P0 female for annual gynecologic exam.   1. Postmenopausal.  No significant menopausal symptoms or any vaginal bleeding. 2. Pap smear/HPV 2019.  No Pap smear done today.  History of cervical dysplasia with cryosurgery 1990 with normal Pap smears since.  Plan repeat Pap smear/HPV at 5-year interval per current screening guidelines. 3. Mammography 07/2018.  Continue with annual mammography when due.  Breast exam normal today. 4. Osteopenia.  DEXA 2019 T score -1.5 FRAX 2.6% / 0.1%.  Plan repeat DEXA in another year or 2. 5.  Colonoscopy 2020.  Repeat at their recommended interval. 6. Health maintenance.  No routine lab work done as patient reports this done elsewhere.  Follow-up 1 year, sooner as needed.   Anastasio Auerbach MD, 3:45 PM 04/13/2019

## 2019-04-23 NOTE — Progress Notes (Signed)
Subjective:    Patient ID: Rebekah Kennedy, female    DOB: [birth year], 63 y.o.   MRN: [id number]  HPI The patient is here for follow up.  She is exercising regularly-she is going to an exercise program at [facility].    Hypertension: She is taking her medication daily.  We just recently started hydralazine 10 mg 3 times a day for elevated blood pressure at home.  She is compliant with a low sodium diet.  She denies chest pain, palpitations, edema, shortness of breath and regular headaches. She does monitor her blood pressure at home - 143/79, 161/84, many 150-180's/90-100's.    Hyperlipidemia: She is taking her medication daily. She is compliant with a low fat/cholesterol diet. She denies myalgias.   Diabetes: She is taking her medication daily as prescribed. She is compliant with a diabetic diet.  She is up-to-date with an ophthalmology examination.   H/o CVA:  She is taking plavix and ASA daily.  She is also taking her statin daily.  She denies any new numbness tingling or weakness.  She has been experiencing increased urination.  She denies any dysuria, hematuria or other urine symptoms.  She does not think she has had polydipsia, but she does tend to drink a lot of fluids.   Medications and allergies reviewed with patient and updated if appropriate.  Patient Active Problem List   Diagnosis Date Noted  . Dermatitis of eyelid, contact or allergic 07/31/2018  . Right sided weakness 07/14/2018  . Left thalamic infarction ([place]) 07/12/2018  . Carotid artery disease ([place]) 07/09/2018  . Numbness and tingling 12/17/2017  . Right foot drop 12/17/2017  . Atherosclerosis of aorta ([place]) 10/09/2017  . Bariatric surgery status, 07/2017 08/19/2017  . Constipation 08/19/2017  . Hypertensive urgency 05/23/2017  . Greater trochanteric bursitis of both hips 12/19/2016  . OSA (obstructive sleep apnea) 07/26/2016  . Hypersomnia 06/11/2016  . Diabetes ([place]) 07/19/2015  . History of colonic polyps  12/01/2014  . Morbidly obese ([place]) 10/05/2013  . Partial nontraumatic tear of right rotator cuff 09/21/2013  . Goiter 12/10/2011  . Asthma 12/10/2011  . 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 History of TIA (transient ischemic attack) 02/13/2011  . PROTEINURIA, MILD 09/15/2009  . Hyperlipidemia 12/01/2006  . Uncontrolled hypertension 07/07/2006    Current Outpatient Medications on File Prior to Visit  Medication Sig Dispense Refill  . albuterol (PROVENTIL HFA;VENTOLIN HFA) 108 (90 Base) MCG/ACT inhaler Inhale 2 puffs into the lungs every 4 (four) hours as needed for wheezing or shortness of breath. 18 g 2  . amLODipine (NORVASC) 5 MG tablet Take 1 tablet (5 mg total) by mouth daily. 90 tablet 1  . aspirin EC 81 MG EC tablet Take 1 tablet (81 mg total) by mouth daily. 30 tablet 0  . Biotin 5000 MCG TABS Take 5,000 mcg by mouth daily.     . calcium-vitamin D (OSCAL WITH D) 500-200 MG-UNIT tablet Take 1 tablet by mouth.    . clopidogrel (PLAVIX) 75 MG tablet TAKE 1 TABLET BY MOUTH EVERY DAY 90 tablet 1  . FARXIGA 5 MG TABS tablet TAKE 1 TABLET BY MOUTH EVERY DAY 90 tablet 0  . hydrALAZINE (APRESOLINE) 10 MG tablet Take 1 tablet (10 mg total) by mouth 3 (three) times daily. 90 tablet 5  . JANUVIA 100 MG tablet TAKE 1 TABLET BY MOUTH EVERY DAY 90 tablet 0  . losartan (COZAAR) 100 MG tablet TAKE 1 TAB BY MOUTH ONCE DAILY. [place]  tablet 1  . metoprolol succinate (TOPROL-XL) 100 MG 24 hr tablet TAKE 1 TABLET BY MOUTH DAILY WITH OR IMMEDIATELY FOLLOWING A MEAL 90 tablet 1  . montelukast (SINGULAIR) 10 MG tablet TAKE 1 TABLET BY MOUTH EVERY DAY 90 tablet 1  . OVER THE COUNTER MEDICATION Take 1 tablet by mouth daily. BariMelt    . rosuvastatin (CRESTOR) 10 MG tablet TAKE 1 TABLET BY MOUTH EVERY DAY 90 tablet 3  . tacrolimus (PROTOPIC) 0.1 % ointment APPLY TO AFFECTED AREA TWICE A DAY (Patient taking differently: daily as needed. ) 30 g 0   No current facility-administered medications on file prior  to visit.     Past Medical History:  Diagnosis Date  . ASTHMA NOS W/ACUTE EXACERBATION 07/10/2010  . Cataract 2010   surgery  . Cervical dysplasia   . DEPRESSION 03/16/2007  . DIABETES MELLITUS, TYPE II 12/01/2006   dx 2004  . Dysmenorrhea   . Fibroid   . HYPERLIPIDEMIA 12/01/2006  . HYPERTENSION 07/07/2006  . Neuropathy   . Obesity   . Osteopenia 05/2017   T score -1.5 FRAX 2.6% / 0.1%  . Retinal edema    gets steriod inj in the eyes    . Sleep apnea    HAS CPAP BUT ADMITS DOES NOT USE JUDICIALLY   . Splenomegaly    in college  . Stroke (cerebrum) ([place]) 2020   left side pt states  . Stroke [facility]) 2012   R pontine, mild residual left hemiparesis  . TIA (transient ischemic attack) 2012   2 TIAs 1 week apart of each other     Past Surgical History:  Procedure Laterality Date  . 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 APPENDECTOMY    . BIOPSY THYROID  05/02/11   Nonneoplastic goiter  . BREAST BIOPSY    . BREAST LUMPECTOMY WITH RADIOACTIVE SEED LOCALIZATION Left 07/25/2017   Procedure: LEFT BREAST LUMPECTOMY WITH RADIOACTIVE SEED LOCALIZATION;  Surgeon: Coralie Keens, MD;  Location: [place];  Service: General;  Laterality: Left;  . BREAST SURGERY     Reduction  . COLONOSCOPY    . COLPOSCOPY    . DILATION AND CURETTAGE OF UTERUS  1975   DUB  . EYE SURGERY     Laser  . GASTRIC ROUX-EN-Y N/A 08/12/2017   Procedure: LAPAROSCOPIC ROUX-EN-Y GASTRIC BYPASS WITH HIATAL HERNIA REPAIR AND UPPER ENDOSCOPY;  Surgeon: [name], [name], MD;  Location[facility];  Service: General;  Laterality: N/A;  . GYNECOLOGIC CRYOSURGERY    . MYOMECTOMY    . OVARIAN CYST REMOVAL    . PELVIC LAPAROSCOPY  75,88   DL lysis of adhesions  . STOMACH SURGERY      Social History   Socioeconomic History  . Marital status: Single    Spouse name: Not on file  . Number of children: 2  . Years of education: 16  . Highest education level: Bachelor's degree (e.g., BA, AB, BS)  Occupational History  .  Occupation: Product manager: [employer]  . Financial resource strain: Not on file  . Food insecurity    Worry: Not on file    Inability: Not on file  . Transportation needs    Medical: Not on file    Non-medical: Not on file  Tobacco Use  . Smoking status: Never Smoker  . Smokeless tobacco: Never Used  Substance and Sexual Activity  . Alcohol use: Yes    Alcohol/week: 0.0 standard drinks  Comment: very rare  . Drug use: No  . Sexual activity: Not Currently    Birth control/protection: Post-menopausal    Comment: 1st intercourse 63 yo-Fewer than 5 partners  Lifestyle  . Physical activity    Days per week: Not on file    Minutes per session: Not on file  . Stress: Not on file  Relationships  . Social Herbalist on phone: Not on file    Gets together: Not on file    Attends religious service: Not on file    Active member of club or organization: Not on file    Attends meetings of clubs or organizations: Not on file    Relationship status: Not on file  Other Topics Concern  . 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 Cancer Maternal Grandmother        Colon Cancer  . Asthma Maternal Grandmother   . Colon cancer Maternal Grandmother   . Diabetes Father   . Heart disease Father   . Hypertension Father   . Hyperlipidemia Father   . Stroke Father   . Hypertension Mother   . Heart disease Mother   . Ovarian cancer Mother   . Cancer Mother        Lung cancer  . Asthma Mother   . COPD Mother   . Hyperlipidemia Mother   . Diabetes Brother   . Hypertension Brother   . Kidney disease Brother   . Asthma Brother   . Heart disease Brother   . Hyperlipidemia Brother   . Cancer Brother        Prostate  .  Colon polyps Brother   . Graves' disease Sister   . Diabetes Sister   . Breast cancer Sister 64  . Graves' disease Paternal Grandmother   . Hypertension Paternal Grandmother   . Heart disease Paternal Grandmother   . Alzheimer's disease Paternal Grandmother   . Cancer Maternal Grandfather   . Heart failure Brother   . Heart failure Brother   . Esophageal cancer Neg Hx   . Rectal cancer Neg Hx   . Stomach cancer Neg Hx     Review of Systems  Constitutional: Negative for chills and fever.  Eyes: Negative for visual disturbance.  Respiratory: Negative for cough, shortness of breath and wheezing.   Cardiovascular: Negative for chest pain, palpitations and leg swelling.  Endocrine: Positive for polyuria.  Genitourinary: Positive for frequency. Negative for dysuria and hematuria.  Neurological: Negative for light-headedness and headaches.       Objective:   Vitals:   04/24/19 1118  BP: (!) 172/82  Pulse: 60  Resp: 16  Temp: 98.3 F (36.8 C)  SpO2: 99%   BP Readings from Last 3 Encounters:  04/24/19 (!) 172/82  04/13/19 126/82  04/10/19 (!) 180/100   Wt Readings from Last 3 Encounters:  04/24/19 167 lb (75.8 kg)  04/13/19 165 lb (74.8 kg)  04/10/19 167 lb (75.8 kg)   Body mass index is 27.79 kg/m.   Physical Exam    Constitutional: Appears well-developed and well-nourished. No distress.  HENT:  Head: Normocephalic and atraumatic.  Neck: Neck supple.  No tracheal deviation present. No thyromegaly present.  No cervical lymphadenopathy Cardiovascular: Normal rate, regular rhythm and normal heart sounds.   No murmur heard. No carotid bruit .  No edema Pulmonary/Chest: Effort normal and breath sounds normal. No respiratory distress. No has no wheezes. No rales.  Skin: Skin is warm and dry. Not diaphoretic.  Psychiatric: Normal mood and affect. Behavior is normal.      Assessment & Plan:    See Problem List for Assessment and Plan of chronic medical problems.    This visit occurred during the SARS-CoV-2 public health emergency.  Safety protocols were in place, including screening questions prior to the visit, additional usage of staff PPE, and extensive cleaning of exam room while observing appropriate contact time as indicated for disinfecting solutions.

## 2019-04-23 NOTE — Patient Instructions (Addendum)
  Tests ordered today. Your results will be released to [place] (or called to you) after review.  If any changes need to be made, you will be notified at that same time.   Medications reviewed and updated.  Changes include :   Increase hydralazine to 20 mg three times a day.  Log your BP and let me know the results.  We will increase the dose if needed.    Your prescription(s) have been submitted to your pharmacy. Please take as directed and contact our office if you believe you are having problem(s) with the medication(s).   Please followup in 3 months

## 2019-04-24 ENCOUNTER — Encounter: Payer: Self-pay | Admitting: Internal Medicine

## 2019-04-24 ENCOUNTER — Other Ambulatory Visit (INDEPENDENT_AMBULATORY_CARE_PROVIDER_SITE_OTHER): Payer: BC Managed Care – PPO

## 2019-04-24 ENCOUNTER — Other Ambulatory Visit: Payer: Self-pay

## 2019-04-24 ENCOUNTER — Ambulatory Visit (INDEPENDENT_AMBULATORY_CARE_PROVIDER_SITE_OTHER): Payer: BC Managed Care – PPO | Admitting: Internal Medicine

## 2019-04-24 VITALS — BP 172/82 | HR 60 | Temp 98.3°F | Resp 16 | Ht 65.0 in | Wt 167.0 lb

## 2019-04-24 DIAGNOSIS — I10 Essential (primary) hypertension: Secondary | ICD-10-CM

## 2019-04-24 DIAGNOSIS — E7849 Other hyperlipidemia: Secondary | ICD-10-CM

## 2019-04-24 DIAGNOSIS — Z794 Long term (current) use of insulin: Secondary | ICD-10-CM

## 2019-04-24 DIAGNOSIS — E1151 Type 2 diabetes mellitus with diabetic peripheral angiopathy without gangrene: Secondary | ICD-10-CM

## 2019-04-24 DIAGNOSIS — R350 Frequency of micturition: Secondary | ICD-10-CM | POA: Insufficient documentation

## 2019-04-24 LAB — URINALYSIS, ROUTINE W REFLEX MICROSCOPIC
Bilirubin Urine: NEGATIVE
Hgb urine dipstick: NEGATIVE
Ketones, ur: NEGATIVE
Leukocytes,Ua: NEGATIVE
Nitrite: NEGATIVE
RBC / HPF: NONE SEEN (ref 0–?)
Specific Gravity, Urine: 1.015 (ref 1.000–1.030)
Total Protein, Urine: 30 — AB
Urine Glucose: >=1000 — AB
Urobilinogen, UA: 1 (ref 0.0–1.0)
pH: 6 (ref 5.0–8.0)

## 2019-04-24 LAB — COMPREHENSIVE METABOLIC PANEL
ALT: 37 U/L — ABNORMAL HIGH (ref 0–35)
AST: 20 U/L (ref 0–37)
Albumin: 4.1 g/dL (ref 3.5–5.2)
Alkaline Phosphatase: 86 U/L (ref 39–117)
BUN: 11 mg/dL (ref 6–23)
CO2: 30 mEq/L (ref 19–32)
Calcium: 9.4 mg/dL (ref 8.4–10.5)
Chloride: 105 mEq/L (ref 96–112)
Creatinine, Ser: 0.68 mg/dL (ref 0.40–1.20)
GFR: 105.85 mL/min (ref >60.00)
Glucose, Bld: 147 mg/dL — ABNORMAL HIGH (ref 70–99)
Potassium: 3.8 mEq/L (ref 3.5–5.1)
Sodium: 142 mEq/L (ref 135–145)
Total Bilirubin: 0.7 mg/dL (ref 0.2–1.2)
Total Protein: 6.9 g/dL (ref 6.0–8.3)

## 2019-04-24 LAB — CBC WITH DIFFERENTIAL/PLATELET
Basophils Absolute: 0.1 10*3/uL (ref 0.0–0.1)
Basophils Relative: 0.8 % (ref 0.0–3.0)
Eosinophils Absolute: 0.3 10*3/uL (ref 0.0–0.7)
Eosinophils Relative: 2.9 % (ref 0.0–5.0)
HCT: 41.1 % (ref 36.0–46.0)
Hemoglobin: 13.6 g/dL (ref 12.0–15.0)
Lymphocytes Relative: 32.7 % (ref 12.0–46.0)
Lymphs Abs: 2.9 10*3/uL (ref 0.7–4.0)
MCHC: 33 g/dL (ref 30.0–36.0)
MCV: 84.3 fl (ref 78.0–100.0)
Monocytes Absolute: 0.6 10*3/uL (ref 0.1–1.0)
Monocytes Relative: 6.5 % (ref 3.0–12.0)
Neutro Abs: 5 10*3/uL (ref 1.4–7.7)
Neutrophils Relative %: 57.1 % (ref 43.0–77.0)
Platelets: 275 10*3/uL (ref 150.0–400.0)
RBC: 4.87 Mil/uL (ref 3.87–5.11)
RDW: 13.9 % (ref 11.5–15.5)
WBC: 8.8 10*3/uL (ref 4.0–10.5)

## 2019-04-24 LAB — LIPID PANEL
Cholesterol: 149 mg/dL (ref 0–200)
HDL: 62.2 mg/dL (ref >39.00)
LDL Cholesterol: 63 mg/dL (ref 0–99)
NonHDL: 86.31
Total CHOL/HDL Ratio: 2
Triglycerides: 116 mg/dL (ref 0.0–149.0)
VLDL: 23.2 mg/dL (ref 0.0–40.0)

## 2019-04-24 LAB — HEMOGLOBIN A1C: Hgb A1c MFr Bld: 7.1 % — ABNORMAL HIGH (ref 4.6–6.5)

## 2019-04-24 LAB — TSH: TSH: 1.19 u[IU]/mL (ref 0.35–4.50)

## 2019-04-24 MED ORDER — HYDRALAZINE HCL 25 MG PO TABS: 25.0000 mg | ORAL_TABLET | Freq: Three times a day (TID) | ORAL | 1 refills | Status: DC

## 2019-04-24 NOTE — Assessment & Plan Note (Signed)
Blood pressure still high Increase hydralazine to 20 mg 3 times a day-she will finish up what she has at home.  Prescription sent for 25 mg tablets, which she will transition to when she finishes up which she has at home She will log her blood pressure for the next week and let me know the results.  If it is still elevated we will then increase to 50 mg 3 times a day Continue all of her other medications CMP

## 2019-04-24 NOTE — Assessment & Plan Note (Signed)
Following with Dr. Loanne Drilling We will check A1c She is exercising regularly and feels she is compliant with a low sugar diet

## 2019-04-24 NOTE — Assessment & Plan Note (Signed)
Check lipid panel, TSH, CMP Continue daily statin Regular exercise and healthy diet encouraged  

## 2019-04-24 NOTE — Assessment & Plan Note (Signed)
She is experiencing urinary frequency without any other concerning symptoms ?  Related to possible UTI versus uncontrolled sugars Having blood work done including A1c Check urinalysis, urine culture

## 2019-04-25 ENCOUNTER — Encounter: Payer: Self-pay | Admitting: Internal Medicine

## 2019-04-25 LAB — URINE CULTURE
MICRO NUMBER:: 1164848
SPECIMEN QUALITY:: ADEQUATE

## 2019-04-26 ENCOUNTER — Encounter: Payer: Self-pay | Admitting: Internal Medicine

## 2019-05-06 ENCOUNTER — Other Ambulatory Visit: Payer: Self-pay | Admitting: Internal Medicine

## 2019-05-10 ENCOUNTER — Other Ambulatory Visit: Payer: Self-pay | Admitting: Internal Medicine

## 2019-05-11 ENCOUNTER — Encounter: Payer: Self-pay | Admitting: Internal Medicine

## 2019-05-22 HISTORY — PX: COLONOSCOPY: SHX174

## 2019-05-23 ENCOUNTER — Other Ambulatory Visit: Payer: Self-pay | Admitting: Internal Medicine

## 2019-05-28 ENCOUNTER — Other Ambulatory Visit: Payer: Self-pay | Admitting: Internal Medicine

## 2019-06-04 ENCOUNTER — Encounter: Payer: Self-pay | Admitting: Neurology

## 2019-06-05 ENCOUNTER — Telehealth (INDEPENDENT_AMBULATORY_CARE_PROVIDER_SITE_OTHER): Payer: BC Managed Care – PPO | Admitting: Neurology

## 2019-06-05 ENCOUNTER — Other Ambulatory Visit: Payer: Self-pay

## 2019-06-05 DIAGNOSIS — H8110 Benign paroxysmal vertigo, unspecified ear: Secondary | ICD-10-CM | POA: Diagnosis not present

## 2019-06-05 DIAGNOSIS — G5731 Lesion of lateral popliteal nerve, right lower limb: Secondary | ICD-10-CM

## 2019-06-05 DIAGNOSIS — E1142 Type 2 diabetes mellitus with diabetic polyneuropathy: Secondary | ICD-10-CM | POA: Diagnosis not present

## 2019-06-05 DIAGNOSIS — I6381 Other cerebral infarction due to occlusion or stenosis of small artery: Secondary | ICD-10-CM

## 2019-06-05 DIAGNOSIS — I639 Cerebral infarction, unspecified: Secondary | ICD-10-CM | POA: Diagnosis not present

## 2019-06-05 NOTE — Progress Notes (Signed)
   Virtual Visit via Video Note The purpose of this virtual visit is to provide medical care while limiting exposure to the novel coronavirus.    Consent was obtained for video visit:  Yes.   Answered questions that patient had about telehealth interaction:  Yes.   I discussed the limitations, risks, security and privacy concerns of performing an evaluation and management service by telemedicine. I also discussed with the patient that there may be a patient responsible charge related to this service. The patient expressed understanding and agreed to proceed.  Pt location: Home Physician Location: office Name of referring provider:  Binnie Rail, MD I connected with Re[facility]ah [facility] at patients initiation/request on 06/05/2019 at  8:30 AM EST by video enabled telemedicine application and verified that I am speaking with the correct person using two identifiers. Pt MRN:  [id number] Pt DOB:  [birth year] Video Participants:  [facility]   History of Present Illness: This is a 64 y.o. female with diabetes mellitus, hyperlipidemia, hypertension, and depression returning for follow-up of stroke (right pontine stroke (2012, mild left residual weakness + left thalamic stroke 06/2018) and right peroneal neuropathy. Today, she has new complaints of vertigo.  Patient was doing well until three weeks ago, when she developed spells of vertigo, described as spinning sensation.  Spells were more frequent occurring several times per day, but have reduced to daily now.  When she has dizzy spell, the sensation can last throughout the day.  She has nausea with one spell. She has become less active because movement tends to trigger the dizziness.  No new vision complaints, weakness, or numbness/tingling.  Her right foot strength remains stable, she is walking without AFO.  Numbness in the feet is mild and unchanged.   Observations/Objective:   Patient is awake, alert, and appears comfortable.  Oriented x 4.     Extraocular muscles are intact. No ptosis. I cannot clearly see any evidence of nystagmus.   Face is symmetric.  Speech is not dysarthric.  Antigravity in all extremities.  No pronator drift. Gait appears stable, unassisted, no foot drop is observed.  She is able to stand on heels and toes.   DATA: Lab Results  Component Value Date   CHOL 149 04/24/2019   HDL 62.20 04/24/2019   LDLCALC 63 04/24/2019   LDLDIRECT 126.7 03/24/2013   TRIG 116.0 04/24/2019   CHOLHDL 2 04/24/2019    Lab Results  Component Value Date   HGBA1C 7.1 (H) 04/24/2019     Assessment and Plan:  1.  BPPV - new  - Start OTC meclizine 25mg  daily as needed   - Refer for vestibular therapy  2.  Cerebrovascular disease with two prior stroke (R pontine 2012 with mild LHP + L thalamic stroke with R side ataxia).   - Continue ASA 81mg  + plavix 75mg  daily  - Continue Crestor 10mg  daily  - Continue BP and diabetes management, both which are well controlled  3.  Diabetic polyneuropathy, stable and mild.   Follow Up Instructions:   I discussed the assessment and treatment plan with the patient. The patient was provided an opportunity to ask questions and all were answered. The patient agreed with the plan and demonstrated an understanding of the instructions.   The patient was advised to call back or seek an in-person evaluation if the symptoms worsen or if the condition fails to improve as anticipated.  Follow-up in 9 months   [place], DO

## 2019-07-02 ENCOUNTER — Other Ambulatory Visit: Payer: Self-pay

## 2019-07-02 MED ORDER — HYDRALAZINE HCL 25 MG PO TABS: 25.0000 mg | ORAL_TABLET | Freq: Three times a day (TID) | ORAL | 1 refills | Status: DC

## 2019-07-22 ENCOUNTER — Encounter: Payer: Self-pay | Admitting: Occupational Therapy

## 2019-07-22 DIAGNOSIS — R2681 Unsteadiness on feet: Secondary | ICD-10-CM

## 2019-07-22 NOTE — Therapy (Signed)
[facility] [address] [place], [place], [address] Phone: [phone]   Fax:  [phone]  Occupational Therapy Treatment  Patient Details  Name: Rebekah [facility] MRN: [id number] Date of Birth: March 06, 1956 Referring Provider (OT): Dr. Billey Gosling   Encounter Date: 07/22/2019    Past Medical History:  Diagnosis Date  . ASTHMA NOS W/ACUTE EXACERBATION 07/10/2010  . Cataract 2010   surgery  . Cervical dysplasia   . DEPRESSION 03/16/2007  . DIABETES MELLITUS, TYPE II 12/01/2006   dx 2004  . Dysmenorrhea   . Fibroid   . HYPERLIPIDEMIA 12/01/2006  . HYPERTENSION 07/07/2006  . Neuropathy   . Obesity   . Osteopenia 05/2017   T score -1.5 FRAX 2.6% / 0.1%  . Retinal edema    gets steriod inj in the eyes    . Sleep apnea    HAS CPAP BUT ADMITS DOES NOT USE JUDICIALLY   . Splenomegaly    in college  . Stroke (cerebrum) ([place]) 2020   left side pt states  . Stroke [facility]) 2012   R pontine, mild residual left hemiparesis  . TIA (transient ischemic attack) 2012   2 TIAs 1 week apart of each other     Past Surgical History:  Procedure Laterality Date  . 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 APPENDECTOMY    . BIOPSY THYROID  05/02/11   Nonneoplastic goiter  . BREAST BIOPSY    . BREAST LUMPECTOMY WITH RADIOACTIVE SEED LOCALIZATION Left 07/25/2017   Procedure: LEFT BREAST LUMPECTOMY WITH RADIOACTIVE SEED LOCALIZATION;  Surgeon: Coralie Keens, MD;  Location: [facility];  Service: General;  Laterality: Left;  . BREAST SURGERY     Reduction  . COLONOSCOPY    . COLPOSCOPY    . DILATION AND CURETTAGE OF UTERUS  1975   DUB  . EYE SURGERY     Laser  . GASTRIC ROUX-EN-Y N/A 08/12/2017   Procedure: LAPAROSCOPIC ROUX-EN-Y GASTRIC BYPASS WITH HIATAL HERNIA REPAIR AND UPPER ENDOSCOPY;  Surgeon: [name], [name], MD;  Location: [facility];  Service: General;  Laterality: N/A;  . GYNECOLOGIC CRYOSURGERY    . MYOMECTOMY    . OVARIAN  CYST REMOVAL    . PELVIC LAPAROSCOPY  75,88   DL lysis of adhesions  . STOMACH SURGERY      There were no vitals filed for this visit.                          OT Short Term Goals - 03/23/19 1741      OT SHORT TERM GOAL #1   Title  Pt will be mod I for RUE coordination HEP ( 01/07/19 goal date adjusted as pt will be on hold for first two weeks of August due to scheduling)    Status  Achieved      OT SHORT TERM GOAL #2   Title  Pt will demonstrate improved coordination as evidenced by decreasing time on 9 hole peg by at least 4 seconds to assist with fine motor tasks    Baseline  31.93    Status  Achieved   01/07/2019  23.99     OT SHORT TERM GOAL #3   Title  Pt will report greater ease in using RUE to brush teeth    Status  Achieved   Pt reports much greater control when brushing her teeth       OT Long Term Goals - 07/22/19  [place] #1   Title  Pt will be mod I with upgraded HEP for functional use of RUE/balance/activity tolerance - 04/20/2019 (renewed on 03/23/2019 as pt missed appt and will need further sessions for HEP education)    Status  Achieved   mailed aquatic HEP and reviewed verbally with pt     OT LONG TERM GOAL #2   Title  Pt will demonstrate improved RUE coordination as evidenced by decreasing time on 9 hole peg test by at least 6 seconds to assist with fine motor tasks    Baseline  31.93    Status  Achieved   01/07/2019 23.99     OT LONG TERM GOAL #3   Title  Pt will demonstrate ability to use RUE to pick up, drink and set cup down without spilling using RUE.    Status  Partially Met      OT LONG TERM GOAL #4   Title  Pt will be able to write 3 sentence paragraph legibly, AE prn    Status  Achieved      OT LONG TERM GOAL #5   Title  Pt will demonstrate ability to walk outside on uneven ground mod I for return to leisure and work related activities    Status  Achieved      OT LONG TERM GOAL #6   Title  Pt will  demonstrate ability to complete simple shopping task mod I    Status  Achieved            Plan - 07/22/19 1054    Clinical Impression Statement  Pt did not return for last visit - mailed aquatic HEP and reviewed via telephone. Wll d/c from OT       Patient will benefit from skilled therapeutic intervention in order to improve the following deficits and impairments:           Visit Diagnosis: Unsteadiness on feet    Problem List Patient Active Problem List   Diagnosis Date Noted  . Urine frequency 04/24/2019  . Dermatitis of eyelid, contact or allergic 07/31/2018  . Right sided weakness 07/14/2018  . Left thalamic infarction ([place]) 07/12/2018  . Carotid artery disease ([place]) 07/09/2018  . Numbness and tingling 12/17/2017  . Right foot drop 12/17/2017  . Atherosclerosis of aorta ([place]) 10/09/2017  . Bariatric surgery status, 07/2017 08/19/2017  . Constipation 08/19/2017  . Hypertensive urgency 05/23/2017  . Greater trochanteric bursitis of both hips 12/19/2016  . OSA (obstructive sleep apnea) 07/26/2016  . Hypersomnia 06/11/2016  . Diabetes ([place]) 07/19/2015  . History of colonic polyps 12/01/2014  . Morbidly obese ([place]) 10/05/2013  . Partial nontraumatic tear of right rotator cuff 09/21/2013  . Goiter 12/10/2011  . Asthma 12/10/2011  . 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 History of TIA (transient ischemic attack) 02/13/2011  . PROTEINURIA, MILD 09/15/2009  . Hyperlipidemia 12/01/2006  . Uncontrolled hypertension 07/07/2006   OCCUPATIONAL THERAPY DISCHARGE SUMMARY  Visits from Start of Care: 15  Current functional level related to goals / functional outcomes: See above   Remaining deficits: See above   Education / Equipment: Land and water based HEP Plan: Patient agrees to discharge.  Patient goals were met. Patient is being discharged due to not returning since the last visit.  ?????      Quay Burow, OTR/L 07/22/2019, 10:56  AM  [facility] [address] [place],  [place], [address] Phone: [phone]   Fax:  [phone]  Name: Rebekah [facility] MRN: [id number] Date of Birth: [birth year]

## 2019-07-23 NOTE — Progress Notes (Signed)
Subjective:    Patient ID: Rebekah [facility], female    DOB: Jul 28, 1955, 64 y.o.   MRN: [id number]  HPI The patient is here for follow up of their chronic medical problems, including hypertension, hyperlipidemia, diabetes, h/o CVA  She is taking all of her medications as prescribed.    She is exercising irregularly.   She stopped when she had the vertigo and is trying to get back into it.   She is having urine frequency.  She denies dysuria, hematuria or odor.    She has been compliant with a low sugar diet.  She did state that today on her way out she did not have time to eat breakfast and ate a candy bar.   Medications and allergies reviewed with patient and updated if appropriate.  Patient Active Problem List   Diagnosis Date Noted  . Urine frequency 04/24/2019  . Dermatitis of eyelid, contact or allergic 07/31/2018  . Right sided weakness 07/14/2018  . Left thalamic infarction ([place]) 07/12/2018  . Carotid artery disease ([place]) 07/09/2018  . Numbness and tingling 12/17/2017  . Right foot drop 12/17/2017  . Atherosclerosis of aorta ([place]) 10/09/2017  . Bariatric surgery status, 07/2017 08/19/2017  . Constipation 08/19/2017  . Hypertensive urgency 05/23/2017  . Greater trochanteric bursitis of both hips 12/19/2016  . OSA (obstructive sleep apnea) 07/26/2016  . Hypersomnia 06/11/2016  . Diabetes ([place]) 07/19/2015  . History of colonic polyps 12/01/2014  . Morbidly obese ([place]) 10/05/2013  . Partial nontraumatic tear of right rotator cuff 09/21/2013  . Goiter 12/10/2011  . Asthma 12/10/2011  . 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 History of TIA (transient ischemic attack) 02/13/2011  . PROTEINURIA, MILD 09/15/2009  . Hyperlipidemia 12/01/2006  . Uncontrolled hypertension 07/07/2006    Current Outpatient Medications on File Prior to Visit  Medication Sig Dispense Refill  . albuterol (PROVENTIL HFA;VENTOLIN HFA) 108 (90 Base) MCG/ACT inhaler Inhale 2 puffs into the  lungs every 4 (four) hours as needed for wheezing or shortness of breath. 18 g 2  . amLODipine (NORVASC) 5 MG tablet Take 1 tablet (5 mg total) by mouth daily. 90 tablet 1  . aspirin EC 81 MG EC tablet Take 1 tablet (81 mg total) by mouth daily. 30 tablet 0  . Biotin 5000 MCG TABS Take 10,000 mcg by mouth daily.     . calcium-vitamin D (OSCAL WITH D) 500-200 MG-UNIT tablet Take 1 tablet by mouth.    . Cholecalciferol (VITAMIN D3 PO) Take by mouth.    . clopidogrel (PLAVIX) 75 MG tablet TAKE 1 TABLET BY MOUTH EVERY DAY 90 tablet 1  . FARXIGA 5 MG TABS tablet TAKE 1 TABLET BY MOUTH EVERY DAY 90 tablet 0  . hydrALAZINE (APRESOLINE) 25 MG tablet Take 1 tablet (25 mg total) by mouth 3 (three) times daily. 270 tablet 1  . JANUVIA 100 MG tablet TAKE 1 TABLET BY MOUTH EVERY DAY 90 tablet 0  . losartan (COZAAR) 100 MG tablet TAKE 1 TAB BY MOUTH ONCE DAILY. 90 tablet 0  . metoprolol succinate (TOPROL-XL) 100 MG 24 hr tablet TAKE 1 TABLET BY MOUTH DAILY WITH OR IMMEDIATELY FOLLOWING A MEAL 90 tablet 1  . montelukast (SINGULAIR) 10 MG tablet TAKE 1 TABLET BY MOUTH EVERY DAY 90 tablet 1  . OVER THE COUNTER MEDICATION Take 1 tablet by mouth daily. BariMelt    . rosuvastatin (CRESTOR) 10 MG tablet TAKE 1 TABLET BY MOUTH EVERY DAY 90 tablet 3  .  tacrolimus (PROTOPIC) 0.1 % ointment APPLY TO AFFECTED AREA TWICE A DAY (Patient taking differently: daily as needed. ) 30 g 0   No current facility-administered medications on file prior to visit.    Past Medical History:  Diagnosis Date  . ASTHMA NOS W/ACUTE EXACERBATION 07/10/2010  . Cataract 2010   surgery  . Cervical dysplasia   . DEPRESSION 03/16/2007  . DIABETES MELLITUS, TYPE II 12/01/2006   dx 2004  . Dysmenorrhea   . Fibroid   . HYPERLIPIDEMIA 12/01/2006  . HYPERTENSION 07/07/2006  . Neuropathy   . Obesity   . Osteopenia 05/2017   T score -1.5 FRAX 2.6% / 0.1%  . Retinal edema    gets steriod inj in the eyes    . Sleep apnea    HAS CPAP BUT  ADMITS DOES NOT USE JUDICIALLY   . Splenomegaly    in college  . Stroke (cerebrum) ([place]) 2020   left side pt states  . Stroke [facility]) 2012   R pontine, mild residual left hemiparesis  . TIA (transient ischemic attack) 2012   2 TIAs 1 week apart of each other     Past Surgical History:  Procedure Laterality Date  . 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 APPENDECTOMY    . BIOPSY THYROID  05/02/11   Nonneoplastic goiter  . BREAST BIOPSY    . BREAST LUMPECTOMY WITH RADIOACTIVE SEED LOCALIZATION Left 07/25/2017   Procedure: LEFT BREAST LUMPECTOMY WITH RADIOACTIVE SEED LOCALIZATION;  Surgeon: Coralie Keens, MD;  Location: [facility];  Service: General;  Laterality: Left;  . BREAST SURGERY     Reduction  . COLONOSCOPY    . COLPOSCOPY    . DILATION AND CURETTAGE OF UTERUS  1975   DUB  . EYE SURGERY     Laser  . GASTRIC ROUX-EN-Y N/A 08/12/2017   Procedure: LAPAROSCOPIC ROUX-EN-Y GASTRIC BYPASS WITH HIATAL HERNIA REPAIR AND UPPER ENDOSCOPY;  Surgeon: [name], [name], MD;  Location[facility];  Service: General;  Laterality: N/A;  . GYNECOLOGIC CRYOSURGERY    . MYOMECTOMY    . OVARIAN CYST REMOVAL    . PELVIC LAPAROSCOPY  75,88   DL lysis of adhesions  . STOMACH SURGERY      Social History   Socioeconomic History  . Marital status: Single    Spouse name: Not on file  . Number of children: 2  . Years of education: 16  . Highest education level: Bachelor's degree (e.g., BA, AB, BS)  Occupational History  . Occupation: Product manager: [employer]  Tobacco Use  . Smoking status: Never Smoker  . Smokeless tobacco: Never Used  Substance and Sexual Activity  . Alcohol use: Yes    Alcohol/week: 0.0 standard drinks    Comment: very rare  . Drug use: No  . Sexual activity: Not Currently    Birth control/protection: Post-menopausal    Comment: 1st intercourse 64 yo-Fewer than 5 partners  Other Topics Concern  . 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 Difficulty of Paying Living Expenses: Not on file  Food Insecurity:   . Worried About [employer] of  Food in the Last Year: Not on file  . Ran Out of Food in the Last Year: Not on file  Transportation Needs:   . Lack of Transportation (Medical): Not on file  . Lack of Transportation (Non-Medical): Not on file  Physical Activity:   . Days of Exercise per Week: Not on file  . Minutes of Exercise per Session: Not on file  Stress:   . Feeling of Stress : Not on file  Social Connections:   . Frequency of Communication with Friends and Family: Not on file  . Frequency of Social Gatherings with Friends and Family: Not on file  . Attends Religious Services: Not on file  . Active Member of Clubs or Organizations: Not on file  . Attends Archivist Meetings: Not on file  . Marital Status: Not on file    Family History  Problem Relation Age of Onset  . Cancer Maternal Grandmother        Colon Cancer  . Asthma Maternal Grandmother   . Colon cancer Maternal Grandmother   . Diabetes Father   . Heart disease Father   . Hypertension Father   . Hyperlipidemia Father   . Stroke Father   . Hypertension Mother   . Heart disease Mother   . Ovarian cancer Mother   . Cancer Mother        Lung cancer  . Asthma Mother   . COPD Mother   . Hyperlipidemia Mother   . Diabetes Brother   . Hypertension Brother   . Kidney disease Brother   . Asthma Brother   . Heart disease Brother   . Hyperlipidemia Brother   . Cancer Brother        Prostate  . Colon polyps Brother   . Graves' disease Sister   . Diabetes Sister   . Breast cancer Sister 22  . Graves' disease Paternal Grandmother   . Hypertension Paternal Grandmother   . Heart disease Paternal Grandmother    . Alzheimer's disease Paternal Grandmother   . Cancer Maternal Grandfather   . Heart failure Brother   . Heart failure Brother   . Esophageal cancer Neg Hx   . Rectal cancer Neg Hx   . Stomach cancer Neg Hx     Review of Systems  Constitutional: Negative for chills and fever.  Respiratory: Positive for wheezing (occ with asthma). Negative for cough and shortness of breath.   Cardiovascular: Negative for chest pain and palpitations.  Genitourinary: Positive for frequency. Negative for dysuria and hematuria.  Neurological: Positive for dizziness (intermittent - vertigo). Negative for light-headedness and headaches.       Objective:   Vitals:   07/24/19 1118  BP: (!) 148/80  Pulse: (!) 59  Resp: 16  Temp: 98.2 F (36.8 C)  SpO2: 97%   BP Readings from Last 3 Encounters:  07/24/19 (!) 148/80  04/24/19 (!) 172/82  04/13/19 126/82   Wt Readings from Last 3 Encounters:  07/24/19 165 lb 12.8 oz (75.2 kg)  04/24/19 167 lb (75.8 kg)  04/13/19 165 lb (74.8 kg)   Body mass index is 27.59 kg/m.   Physical Exam    Constitutional: Appears well-developed and well-nourished. No distress.  HENT:  Head: Normocephalic and atraumatic.  Neck: Neck supple. No tracheal deviation present. No thyromegaly present.  No cervical lymphadenopathy Cardiovascular: Normal rate, regular rhythm and normal heart sounds.  No murmur heard. No carotid bruit .  No edema Pulmonary/Chest: Effort normal and breath sounds normal. No  respiratory distress. No has no wheezes. No rales.  Skin: Skin is warm and dry. Not diaphoretic.  Psychiatric: Normal mood and affect. Behavior is normal.      Assessment & Plan:    See Problem List for Assessment and Plan of chronic medical problems.    This visit occurred during the SARS-CoV-2 public health emergency.  Safety protocols were in place, including screening questions prior to the visit, additional usage of staff PPE, and extensive cleaning of exam room  while observing appropriate contact time as indicated for disinfecting solutions.

## 2019-07-23 NOTE — Patient Instructions (Addendum)
  Blood work was ordered.     Medications reviewed and updated.  Changes include :   none  Your prescription(s) have been submitted to your pharmacy. Please take as directed and contact our office if you believe you are having problem(s) with the medication(s).   Please followup in 3 months

## 2019-07-24 ENCOUNTER — Other Ambulatory Visit: Payer: Self-pay

## 2019-07-24 ENCOUNTER — Ambulatory Visit: Payer: BC Managed Care – PPO | Admitting: Internal Medicine

## 2019-07-24 ENCOUNTER — Encounter: Payer: Self-pay | Admitting: Internal Medicine

## 2019-07-24 VITALS — BP 148/80 | HR 59 | Temp 98.2°F | Resp 16 | Ht 65.0 in | Wt 165.8 lb

## 2019-07-24 DIAGNOSIS — Z8673 Personal history of transient ischemic attack (TIA), and cerebral infarction without residual deficits: Secondary | ICD-10-CM

## 2019-07-24 DIAGNOSIS — E1151 Type 2 diabetes mellitus with diabetic peripheral angiopathy without gangrene: Secondary | ICD-10-CM

## 2019-07-24 DIAGNOSIS — I10 Essential (primary) hypertension: Secondary | ICD-10-CM

## 2019-07-24 DIAGNOSIS — R350 Frequency of micturition: Secondary | ICD-10-CM | POA: Diagnosis not present

## 2019-07-24 DIAGNOSIS — E7849 Other hyperlipidemia: Secondary | ICD-10-CM

## 2019-07-24 DIAGNOSIS — Z794 Long term (current) use of insulin: Secondary | ICD-10-CM

## 2019-07-24 LAB — COMPREHENSIVE METABOLIC PANEL
ALT: 22 U/L (ref 0–35)
AST: 15 U/L (ref 0–37)
Albumin: 3.8 g/dL (ref 3.5–5.2)
Alkaline Phosphatase: 81 U/L (ref 39–117)
BUN: 13 mg/dL (ref 6–23)
CO2: 31 mEq/L (ref 19–32)
Calcium: 9.6 mg/dL (ref 8.4–10.5)
Chloride: 107 mEq/L (ref 96–112)
Creatinine, Ser: 0.69 mg/dL (ref 0.40–1.20)
GFR: 103.99 mL/min (ref >60.00)
Glucose, Bld: 148 mg/dL — ABNORMAL HIGH (ref 70–99)
Potassium: 3.7 mEq/L (ref 3.5–5.1)
Sodium: 143 mEq/L (ref 135–145)
Total Bilirubin: 0.7 mg/dL (ref 0.2–1.2)
Total Protein: 6.9 g/dL (ref 6.0–8.3)

## 2019-07-24 LAB — POCT URINALYSIS DIPSTICK
Bilirubin, UA: NEGATIVE
Blood, UA: NEGATIVE
Glucose, UA: POSITIVE — AB
Ketones, UA: NEGATIVE
Leukocytes, UA: NEGATIVE
Nitrite, UA: NEGATIVE
Protein, UA: POSITIVE — AB
Spec Grav, UA: >=1.03 — AB (ref 1.010–1.025)
Urobilinogen, UA: NEGATIVE E.U./dL — AB
pH, UA: 5.5 (ref 5.0–8.0)

## 2019-07-24 LAB — HEMOGLOBIN A1C: Hgb A1c MFr Bld: 6.8 % — ABNORMAL HIGH (ref 4.6–6.5)

## 2019-07-24 MED ORDER — AMLODIPINE BESYLATE 5 MG PO TABS: 5.0000 mg | ORAL_TABLET | Freq: Every day | ORAL | 1 refills | Status: DC

## 2019-07-24 NOTE — Assessment & Plan Note (Signed)
Chronic Urine dip here shows glucose Check A1c Stressed regular exercise and the need for her to get back to regular exercise Stressed low sugar diet Continue current medication-we will increase [place] if needed Follow-up in 3 months

## 2019-07-24 NOTE — Assessment & Plan Note (Signed)
Chronic Blood pressure slightly elevated here today, but seems to be better controlled at home-she states it is rarely over 140 Stressed the importance of getting back into regular exercise Monitor BP at home-discussed goal of less than 130 on top Continue current medication for now.  Will consider increasing hydralazine at her next visit CMP

## 2019-07-24 NOTE — Assessment & Plan Note (Addendum)
Chronic Lipids have been well controlled and at goal Continue daily statin Regular exercise and healthy diet encouraged

## 2019-07-24 NOTE — Assessment & Plan Note (Signed)
With residual mild left hemiparesis Continue aspirin 81 mg daily, Plavix 75 mg daily Continue Crestor Discussed the importance of regular exercise, low sugar diet, healthy diet Discussed needing to get her blood pressure slightly better controlled

## 2019-07-24 NOTE — Assessment & Plan Note (Signed)
Stating urinary frequency, no other concerning urine symptoms We will rule out UTI, possibly related to elevated sugars-there is evidence of glucose in her urine, may also be related to the [place] Urine dip here not convincing for an infection-we will send for culture Also check an A1c

## 2019-07-25 LAB — URINE CULTURE

## 2019-07-26 ENCOUNTER — Encounter: Payer: Self-pay | Admitting: Internal Medicine

## 2019-07-28 ENCOUNTER — Other Ambulatory Visit: Payer: Self-pay

## 2019-07-28 MED ORDER — SITAGLIPTIN PHOSPHATE 100 MG PO TABS: 100.0000 mg | ORAL_TABLET | Freq: Every day | ORAL | 1 refills | Status: DC

## 2019-07-30 ENCOUNTER — Ambulatory Visit: Payer: BC Managed Care – PPO | Attending: Internal Medicine

## 2019-07-30 DIAGNOSIS — Z23 Encounter for immunization: Secondary | ICD-10-CM

## 2019-07-30 NOTE — Progress Notes (Signed)
2  Covid-19 Vaccination Clinic  Name:  Rebekah [facility]    MRN: [id number] DOB: [birth year]  07/30/2019  Rebekah [facility] was observed post Covid-19 immunization for 15 minutes without incident. She was provided with Vaccine Information Sheet and instruction to access the V-Safe system.   Rebekah [facility] was instructed to call 911 with any severe reactions post vaccine: Marland Kitchen Difficulty breathing  . Swelling of face and throat  . A fast heartbeat  . A bad rash all over body  . Dizziness and weakness   Immunizations Administered    Name Date Dose VIS Date Route   Pfizer COVID-19 Vaccine 07/30/2019  8:51 AM 0.3 mL 05/01/2019 Intramuscular   Manufacturer: [place]   Lot: [id number]   [place]: [id number]

## 2019-08-10 ENCOUNTER — Ambulatory Visit: Payer: BC Managed Care – PPO | Admitting: Neurology

## 2019-08-24 ENCOUNTER — Ambulatory Visit: Payer: BC Managed Care – PPO | Attending: Internal Medicine

## 2019-08-24 DIAGNOSIS — Z23 Encounter for immunization: Secondary | ICD-10-CM

## 2019-08-24 NOTE — Progress Notes (Signed)
   Covid-19 Vaccination Clinic  Name:  Rebekah [facility]    MRN: [id number] DOB: 1956-03-22  08/24/2019  Rebekah [facility] was observed post Covid-19 immunization for 15 minutes without incident. She was provided with Vaccine Information Sheet and instruction to access the V-Safe system.   Rebekah [facility] was instructed to call 911 with any severe reactions post vaccine: Marland Kitchen Difficulty breathing  . Swelling of face and throat  . A fast heartbeat  . A bad rash all over body  . Dizziness and weakness   Immunizations Administered    Name Date Dose VIS Date Route   Pfizer COVID-19 Vaccine 08/24/2019  8:54 AM 0.3 mL 05/01/2019 Intramuscular   Manufacturer: [place]   Lot: [id number]   [place]: [id number]

## 2019-08-27 ENCOUNTER — Other Ambulatory Visit: Payer: Self-pay | Admitting: Internal Medicine

## 2019-08-31 ENCOUNTER — Other Ambulatory Visit: Payer: Self-pay | Admitting: Internal Medicine

## 2019-08-31 DIAGNOSIS — R000 Tachycardia, unspecified: Secondary | ICD-10-CM

## 2019-08-31 DIAGNOSIS — I10 Essential (primary) hypertension: Secondary | ICD-10-CM

## 2019-09-14 ENCOUNTER — Other Ambulatory Visit: Payer: Self-pay | Admitting: Internal Medicine

## 2019-09-21 ENCOUNTER — Ambulatory Visit: Payer: BC Managed Care – PPO | Admitting: Internal Medicine

## 2019-09-21 ENCOUNTER — Encounter: Payer: Self-pay | Admitting: Internal Medicine

## 2019-09-21 ENCOUNTER — Other Ambulatory Visit: Payer: Self-pay

## 2019-09-21 VITALS — BP 176/90 | HR 73 | Temp 98.7°F | Resp 16 | Ht 65.0 in | Wt 167.8 lb

## 2019-09-21 DIAGNOSIS — W57XXXA Bitten or stung by nonvenomous insect and other nonvenomous arthropods, initial encounter: Secondary | ICD-10-CM | POA: Diagnosis not present

## 2019-09-21 DIAGNOSIS — S40862A Insect bite (nonvenomous) of left upper arm, initial encounter: Secondary | ICD-10-CM | POA: Diagnosis not present

## 2019-09-21 DIAGNOSIS — S40869A Insect bite (nonvenomous) of unspecified upper arm, initial encounter: Secondary | ICD-10-CM | POA: Insufficient documentation

## 2019-09-21 DIAGNOSIS — S80869A Insect bite (nonvenomous), unspecified lower leg, initial encounter: Secondary | ICD-10-CM | POA: Insufficient documentation

## 2019-09-21 DIAGNOSIS — I10 Essential (primary) hypertension: Secondary | ICD-10-CM | POA: Diagnosis not present

## 2019-09-21 DIAGNOSIS — S80861A Insect bite (nonvenomous), right lower leg, initial encounter: Secondary | ICD-10-CM

## 2019-09-21 DIAGNOSIS — S0096XA Insect bite (nonvenomous) of unspecified part of head, initial encounter: Secondary | ICD-10-CM | POA: Insufficient documentation

## 2019-09-21 NOTE — Assessment & Plan Note (Signed)
Acute Tick bite of right posterior leg No evidence of infection Tick was not on > 24 hrs so low risk of lyme No treatment needed Reviewed symptoms of lyme Discussed tick prevention

## 2019-09-21 NOTE — Progress Notes (Signed)
Subjective:    Patient ID: Rebekah Kennedy, female    DOB: [birth year], 64 y.o.   MRN: [id number]  HPI The patient is here for an acute visit.   Tick bite:  She two tick bites last Thursday.  One is on her left upper arm and one on the posterior right thigh.  She is unsure how long they were on her, but knows it was less than 24 hrs.  They were not engorged.  She did remove them with the tweezers.  She has 4 dogs.  The bites itch now.      She denies fever/chills, headaches, rashes and changes in her chronic pain.     Medications and allergies reviewed with patient and updated if appropriate.  Patient Active Problem List   Diagnosis Date Noted  . Urinary frequency 04/24/2019  . Dermatitis of eyelid, contact or allergic 07/31/2018  . Right sided weakness 07/14/2018  . Left thalamic infarction ([place]) 07/12/2018  . Carotid artery disease ([place]) 07/09/2018  . Numbness and tingling 12/17/2017  . Right foot drop 12/17/2017  . Atherosclerosis of aorta ([place]) 10/09/2017  . Bariatric surgery status, 07/2017 08/19/2017  . Constipation 08/19/2017  . Hypertensive urgency 05/23/2017  . Greater trochanteric bursitis of both hips 12/19/2016  . OSA (obstructive sleep apnea) 07/26/2016  . Hypersomnia 06/11/2016  . Diabetes ([place]) 07/19/2015  . History of colonic polyps 12/01/2014  . Morbidly obese ([place]) 10/05/2013  . Partial nontraumatic tear of right rotator cuff 09/21/2013  . Goiter 12/10/2011  . Asthma 12/10/2011  . 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 History of TIA (transient ischemic attack) 02/13/2011  . PROTEINURIA, MILD 09/15/2009  . Hyperlipidemia 12/01/2006  . Uncontrolled hypertension 07/07/2006    Current Outpatient Medications on File Prior to Visit  Medication Sig Dispense Refill  . albuterol (PROVENTIL HFA;VENTOLIN HFA) 108 (90 Base) MCG/ACT inhaler Inhale 2 puffs into the lungs every 4 (four) hours as needed for wheezing or shortness of breath. 18 g 2  .  amLODipine (NORVASC) 5 MG tablet Take 1 tablet (5 mg total) by mouth daily. 90 tablet 1  . aspirin EC 81 MG EC tablet Take 1 tablet (81 mg total) by mouth daily. 30 tablet 0  . Biotin 5000 MCG TABS Take 10,000 mcg by mouth daily.     . calcium-vitamin D (OSCAL WITH D) 500-200 MG-UNIT tablet Take 1 tablet by mouth.    . Cholecalciferol (VITAMIN D3 PO) Take 2,000 Units by mouth daily.    . clopidogrel (PLAVIX) 75 MG tablet TAKE 1 TABLET BY MOUTH EVERY DAY 90 tablet 1  . FARXIGA 5 MG TABS tablet TAKE 1 TABLET BY MOUTH EVERY DAY 90 tablet 0  . hydrALAZINE (APRESOLINE) 25 MG tablet Take 1 tablet (25 mg total) by mouth 3 (three) times daily. 270 tablet 1  . losartan (COZAAR) 100 MG tablet TAKE 1 TAB BY MOUTH ONCE DAILY. 90 tablet 0  . metoprolol succinate (TOPROL-XL) 100 MG 24 hr tablet TAKE 1 TABLET BY MOUTH DAILY WITH OR IMMEDIATELY FOLLOWING A MEAL 90 tablet 1  . montelukast (SINGULAIR) 10 MG tablet TAKE 1 TABLET BY MOUTH EVERY DAY 90 tablet 1  . OVER THE COUNTER MEDICATION Take 1 tablet by mouth daily. BariMelt    . rosuvastatin (CRESTOR) 10 MG tablet TAKE 1 TABLET BY MOUTH EVERY DAY 90 tablet 3  . sitaGLIPtin (JANUVIA) 100 MG tablet Take 1 tablet (100 mg total) by mouth daily. 90 tablet 1  . tacrolimus (PROTOPIC)  0.1 % ointment APPLY TO AFFECTED AREA TWICE A DAY (Patient taking differently: daily as needed. ) 30 g 0   No current facility-administered medications on file prior to visit.    Past Medical History:  Diagnosis Date  . ASTHMA NOS W/ACUTE EXACERBATION 07/10/2010  . Cataract 2010   surgery  . Cervical dysplasia   . DEPRESSION 03/16/2007  . DIABETES MELLITUS, TYPE II 12/01/2006   dx 2004  . Dysmenorrhea   . Fibroid   . HYPERLIPIDEMIA 12/01/2006  . HYPERTENSION 07/07/2006  . Neuropathy   . Obesity   . Osteopenia 05/2017   T score -1.5 FRAX 2.6% / 0.1%  . Retinal edema    gets steriod inj in the eyes    . Sleep apnea    HAS CPAP BUT ADMITS DOES NOT USE JUDICIALLY   .  Splenomegaly    in college  . Stroke (cerebrum) ([place]) 2020   left side pt states  . Stroke [facility]) 2012   R pontine, mild residual left hemiparesis  . TIA (transient ischemic attack) 2012   2 TIAs 1 week apart of each other     Past Surgical History:  Procedure Laterality Date  . 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 APPENDECTOMY    . BIOPSY THYROID  05/02/11   Nonneoplastic goiter  . BREAST BIOPSY    . BREAST LUMPECTOMY WITH RADIOACTIVE SEED LOCALIZATION Left 07/25/2017   Procedure: LEFT BREAST LUMPECTOMY WITH RADIOACTIVE SEED LOCALIZATION;  Surgeon: Coralie Keens, MD;  Location: [place];  Service: General;  Laterality: Left;  . BREAST SURGERY     Reduction  . COLONOSCOPY    . COLPOSCOPY    . DILATION AND CURETTAGE OF UTERUS  1975   DUB  . EYE SURGERY     Laser  . GASTRIC ROUX-EN-Y N/A 08/12/2017   Procedure: LAPAROSCOPIC ROUX-EN-Y GASTRIC BYPASS WITH HIATAL HERNIA REPAIR AND UPPER ENDOSCOPY;  Surgeon: [name], [name], MD;  Location: [facility];  Service: General;  Laterality: N/A;  . GYNECOLOGIC CRYOSURGERY    . MYOMECTOMY    . OVARIAN CYST REMOVAL    . PELVIC LAPAROSCOPY  75,88   DL lysis of adhesions  . STOMACH SURGERY      Social History   Socioeconomic History  . Marital status: Single    Spouse name: Not on file  . Number of children: 2  . Years of education: 16  . Highest education level: Bachelor's degree (e.g., BA, AB, BS)  Occupational History  . Occupation: Product manager: [employer]  Tobacco Use  . Smoking status: Never Smoker  . Smokeless tobacco: Never Used  Substance and Sexual Activity  . Alcohol use: Yes    Alcohol/week: 0.0 standard drinks    Comment: very rare  . Drug use: No  . Sexual activity: Not Currently    Birth control/protection: Post-menopausal    Comment: 1st intercourse 64 yo-Fewer than 5 partners  Other Topics Concern  . 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 Difficulty of Paying Living Expenses:   Food Insecurity:   . Worried About Charity fundraiser in the  Last Year:   . [place] in the Last Year:   Transportation Needs:   . Film/video editor (Medical):   Marland Kitchen Lack of Transportation (Non-Medical):   Physical Activity:   . Days of Exercise per Week:   . Minutes of Exercise per Session:   Stress:   . Feeling of Stress :   Social Connections:   . Frequency of Communication with Friends and Family:   . Frequency of Social Gatherings with Friends and Family:   . Attends Religious Services:   . Active Member of Clubs or Organizations:   . Attends Archivist Meetings:   Marland Kitchen Marital Status:     Family History  Problem Relation Age of Onset  . Cancer Maternal Grandmother        Colon Cancer  . Asthma Maternal Grandmother   . Colon cancer Maternal Grandmother   . Diabetes Father   . Heart disease Father   . Hypertension Father   . Hyperlipidemia Father   . Stroke Father   . Hypertension Mother   . Heart disease Mother   . Ovarian cancer Mother   . Cancer Mother        Lung cancer  . Asthma Mother   . COPD Mother   . Hyperlipidemia Mother   . Diabetes Brother   . Hypertension Brother   . Kidney disease Brother   . Asthma Brother   . Heart disease Brother   . Hyperlipidemia Brother   . Cancer Brother        Prostate  . Colon polyps Brother   . Graves' disease Sister   . Diabetes Sister   . Breast cancer Sister 55  . Graves' disease Paternal Grandmother   . Hypertension Paternal Grandmother   . Heart disease Paternal Grandmother   . Alzheimer's disease Paternal Grandmother   . Cancer Maternal Grandfather   . Heart failure Brother   . Heart failure Brother   . Esophageal cancer Neg Hx   . Rectal cancer Neg Hx   .  Stomach cancer Neg Hx     Review of Systems  Constitutional: Negative for chills and fever.  Musculoskeletal:       No change in pain  Skin: Positive for wound. Negative for rash.  Neurological: Negative for dizziness, light-headedness and headaches.       Objective:   Vitals:   09/21/19 1557  BP: (!) 176/90  Pulse: 73  Resp: 16  Temp: 98.7 F (37.1 C)  SpO2: 99%   BP Readings from Last 3 Encounters:  09/21/19 (!) 176/90  07/24/19 (!) 148/80  04/24/19 (!) 172/82   Wt Readings from Last 3 Encounters:  09/21/19 167 lb 12.8 oz (76.1 kg)  07/24/19 165 lb 12.8 oz (75.2 kg)  04/24/19 167 lb (75.8 kg)   Body mass index is 27.92 kg/m.   Physical Exam Constitutional:      General: She is not in acute distress.    Appearance: Normal appearance. She is not ill-appearing.  HENT:     Head: Normocephalic and atraumatic.  Skin:    General: Skin is warm and dry.     Findings: Lesion (insect bite left upper medial arm and right posterior upper leg - neither with bleeding or discharge, no surrounding erythema, no swelling) present.  Neurological:     Mental Status: She is alert.            Assessment & Plan:    See Problem List for Assessment  and Plan of chronic medical problems.    This visit occurred during the SARS-CoV-2 public health emergency.  Safety protocols were in place, including screening questions prior to the visit, additional usage of staff PPE, and extensive cleaning of exam room while observing appropriate contact time as indicated for disinfecting solutions.

## 2019-09-21 NOTE — Assessment & Plan Note (Signed)
Chronic BP elevated here today, but had increased stress just before coming back to the exam room - her son was in a car accident She has not been checking it at home She is taking her medication Advised her to start checking her BP regularly and keep a log - if not controlled she needs to follow up

## 2019-09-21 NOTE — Patient Instructions (Addendum)
Monitor your tick bites. No treatment is needed.   If you see a target lesion or if you develop any symptoms of lyme like we discussed please contact us    Monitor your BP at home  - keep a log and return if it is frequently more than 140 on top.

## 2019-09-21 NOTE — Assessment & Plan Note (Signed)
Acute Tick bite of left upper arm No evidence of infection Tick was not on > 24 hrs so low risk of lyme No treatment needed Reviewed symptoms of lyme Discussed tick prevention

## 2019-09-21 NOTE — Assessment & Plan Note (Signed)
Acute Two tick bites - one in arm and one on leg Ticks were not on > 24 hrs so low risk of lyme No treatment needed Reviewed symptoms of lyme Discussed tick prevention

## 2019-10-05 ENCOUNTER — Encounter: Payer: Self-pay | Admitting: Obstetrics and Gynecology

## 2019-10-24 DIAGNOSIS — G8194 Hemiplegia, unspecified affecting left nondominant side: Secondary | ICD-10-CM | POA: Insufficient documentation

## 2019-10-24 NOTE — Patient Instructions (Signed)
  Blood work was ordered.     Medications reviewed and updated.  Changes include :     Your prescription(s) have been submitted to your pharmacy. Please take as directed and contact our office if you believe you are having problem(s) with the medication(s).  A referral was ordered for        Someone will call you to schedule this.    Please followup in 6 months   

## 2019-10-24 NOTE — Progress Notes (Signed)
Subjective:    Patient ID: Rebekah Kennedy, female    DOB: 1955-06-30, 64 y.o.   MRN: [id number]  HPI The patient is here for follow up of their chronic medical problems, including htn, hyperlipidemia,DM, h/o CVA w/ L hemiparesis      Medications and allergies reviewed with patient and updated if appropriate.  Patient Active Problem List   Diagnosis Date Noted  . Insect bite of arm 09/21/2019  . Insect bite of leg 09/21/2019  . Tick bite 09/21/2019  . Urinary frequency 04/24/2019  . Dermatitis of eyelid, contact or allergic 07/31/2018  . Right sided weakness 07/14/2018  . Left thalamic infarction ([place]) 07/12/2018  . Carotid artery disease ([place]) 07/09/2018  . Numbness and tingling 12/17/2017  . Right foot drop 12/17/2017  . Atherosclerosis of aorta ([place]) 10/09/2017  . Bariatric surgery status, 07/2017 08/19/2017  . Constipation 08/19/2017  . Hypertensive urgency 05/23/2017  . Greater trochanteric bursitis of both hips 12/19/2016  . OSA (obstructive sleep apnea) 07/26/2016  . Hypersomnia 06/11/2016  . Diabetes ([place]) 07/19/2015  . History of colonic polyps 12/01/2014  . Morbidly obese ([place]) 10/05/2013  . Partial nontraumatic tear of right rotator cuff 09/21/2013  . Goiter 12/10/2011  . Asthma 12/10/2011  . 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 History of TIA (transient ischemic attack) 02/13/2011  . PROTEINURIA, MILD 09/15/2009  . Hyperlipidemia 12/01/2006  . Uncontrolled hypertension 07/07/2006    Current Outpatient Medications on File Prior to Visit  Medication Sig Dispense Refill  . albuterol (PROVENTIL HFA;VENTOLIN HFA) 108 (90 Base) MCG/ACT inhaler Inhale 2 puffs into the lungs every 4 (four) hours as needed for wheezing or shortness of breath. 18 g 2  . amLODipine (NORVASC) 5 MG tablet Take 1 tablet (5 mg total) by mouth daily. 90 tablet 1  . aspirin EC 81 MG EC tablet Take 1 tablet (81 mg total) by mouth daily. 30 tablet 0  . Biotin 5000 MCG TABS Take  10,000 mcg by mouth daily.     . calcium-vitamin D (OSCAL WITH D) 500-200 MG-UNIT tablet Take 1 tablet by mouth.    . Cholecalciferol (VITAMIN D3 PO) Take 2,000 Units by mouth daily.    . clopidogrel (PLAVIX) 75 MG tablet TAKE 1 TABLET BY MOUTH EVERY DAY 90 tablet 1  . FARXIGA 5 MG TABS tablet TAKE 1 TABLET BY MOUTH EVERY DAY 90 tablet 0  . hydrALAZINE (APRESOLINE) 25 MG tablet Take 1 tablet (25 mg total) by mouth 3 (three) times daily. 270 tablet 1  . losartan (COZAAR) 100 MG tablet TAKE 1 TAB BY MOUTH ONCE DAILY. 90 tablet 0  . metoprolol succinate (TOPROL-XL) 100 MG 24 hr tablet TAKE 1 TABLET BY MOUTH DAILY WITH OR IMMEDIATELY FOLLOWING A MEAL 90 tablet 1  . montelukast (SINGULAIR) 10 MG tablet TAKE 1 TABLET BY MOUTH EVERY DAY 90 tablet 1  . OVER THE COUNTER MEDICATION Take 1 tablet by mouth daily. BariMelt    . rosuvastatin (CRESTOR) 10 MG tablet TAKE 1 TABLET BY MOUTH EVERY DAY 90 tablet 3  . sitaGLIPtin (JANUVIA) 100 MG tablet Take 1 tablet (100 mg total) by mouth daily. 90 tablet 1  . tacrolimus (PROTOPIC) 0.1 % ointment APPLY TO AFFECTED AREA TWICE A DAY (Patient taking differently: daily as needed. ) 30 g 0   No current facility-administered medications on file prior to visit.    Past Medical History:  Diagnosis Date  . ASTHMA NOS W/ACUTE EXACERBATION 07/10/2010  . Cataract 2010  surgery  . Cervical dysplasia   . DEPRESSION 03/16/2007  . DIABETES MELLITUS, TYPE II 12/01/2006   dx 2004  . Dysmenorrhea   . Fibroid   . HYPERLIPIDEMIA 12/01/2006  . HYPERTENSION 07/07/2006  . Neuropathy   . Obesity   . Osteopenia 05/2017   T score -1.5 FRAX 2.6% / 0.1%  . Retinal edema    gets steriod inj in the eyes    . Sleep apnea    HAS CPAP BUT ADMITS DOES NOT USE JUDICIALLY   . Splenomegaly    in college  . Stroke (cerebrum) ([place]) 2020   left side pt states  . Stroke [facility]) 2012   R pontine, mild residual left hemiparesis  . TIA (transient ischemic attack) 2012   2 TIAs 1 week apart  of each other     Past Surgical History:  Procedure Laterality Date  . 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 APPENDECTOMY    . BIOPSY THYROID  05/02/11   Nonneoplastic goiter  . BREAST BIOPSY    . BREAST LUMPECTOMY WITH RADIOACTIVE SEED LOCALIZATION Left 07/25/2017   Procedure: LEFT BREAST LUMPECTOMY WITH RADIOACTIVE SEED LOCALIZATION;  Surgeon: Coralie Keens, MD;  Location: [place];  Service: General;  Laterality: Left;  . BREAST SURGERY     Reduction  . COLONOSCOPY    . COLPOSCOPY    . DILATION AND CURETTAGE OF UTERUS  1975   DUB  . EYE SURGERY     Laser  . GASTRIC ROUX-EN-Y N/A 08/12/2017   Procedure: LAPAROSCOPIC ROUX-EN-Y GASTRIC BYPASS WITH HIATAL HERNIA REPAIR AND UPPER ENDOSCOPY;  Surgeon: [name], [name], MD;  [facility];  Service: General;  Laterality: N/A;  . GYNECOLOGIC CRYOSURGERY    . MYOMECTOMY    . OVARIAN CYST REMOVAL    . PELVIC LAPAROSCOPY  75,88   DL lysis of adhesions  . STOMACH SURGERY      Social History   Socioeconomic History  . Marital status: Single    Spouse name: Not on file  . Number of children: 2  . Years of education: 16  . Highest education level: Bachelor's degree (e.g., BA, AB, BS)  Occupational History  . Occupation: Product manager: [employer]  Tobacco Use  . Smoking status: Never Smoker  . Smokeless tobacco: Never Used  Substance and Sexual Activity  . Alcohol use: Yes    Alcohol/week: 0.0 standard drinks    Comment: very rare  . Drug use: No  . Sexual activity: Not Currently    Birth control/protection: Post-menopausal    Comment: 1st intercourse 64 yo-Fewer than 5 partners  Other Topics Concern  . 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 Difficulty of Paying  Living Expenses:   Food Insecurity:   . Worried About Charity fundraiser in the Last Year:   . Arboriculturist in the Last Year:   Transportation Needs:   . Film/video editor (Medical):   Marland Kitchen Lack of Transportation (Non-Medical):   Physical Activity:   . Days of Exercise per Week:   . Minutes of Exercise per Session:   Stress:   .  Feeling of Stress :   Social Connections:   . Frequency of Communication with Friends and Family:   . Frequency of Social Gatherings with Friends and Family:   . Attends Religious Services:   . Active Member of Clubs or Organizations:   . Attends Archivist Meetings:   Marland Kitchen Marital Status:     Family History  Problem Relation Age of Onset  . Cancer Maternal Grandmother        Colon Cancer  . Asthma Maternal Grandmother   . Colon cancer Maternal Grandmother   . Diabetes Father   . Heart disease Father   . Hypertension Father   . Hyperlipidemia Father   . Stroke Father   . Hypertension Mother   . Heart disease Mother   . Ovarian cancer Mother   . Cancer Mother        Lung cancer  . Asthma Mother   . COPD Mother   . Hyperlipidemia Mother   . Diabetes Brother   . Hypertension Brother   . Kidney disease Brother   . Asthma Brother   . Heart disease Brother   . Hyperlipidemia Brother   . Cancer Brother        Prostate  . Colon polyps Brother   . Graves' disease Sister   . Diabetes Sister   . Breast cancer Sister 60  . Graves' disease Paternal Grandmother   . Hypertension Paternal Grandmother   . Heart disease Paternal Grandmother   . Alzheimer's disease Paternal Grandmother   . Cancer Maternal Grandfather   . Heart failure Brother   . Heart failure Brother   . Esophageal cancer Neg Hx   . Rectal cancer Neg Hx   . Stomach cancer Neg Hx     Review of Systems     Objective:  There were no vitals filed for this visit. BP Readings from Last 3 Encounters:  09/21/19 (!) 176/90  07/24/19 (!) 148/80  04/24/19 (!) 172/82    Wt Readings from Last 3 Encounters:  09/21/19 167 lb 12.8 oz (76.1 kg)  07/24/19 165 lb 12.8 oz (75.2 kg)  04/24/19 167 lb (75.8 kg)   There is no height or weight on file to calculate BMI.   Physical Exam    Constitutional: Appears well-developed and well-nourished. No distress.  HENT:  Head: Normocephalic and atraumatic.  Neck: Neck supple. No tracheal deviation present. No thyromegaly present.  No cervical lymphadenopathy Cardiovascular: Normal rate, regular rhythm and normal heart sounds.   No murmur heard. No carotid bruit .  No edema Pulmonary/Chest: Effort normal and breath sounds normal. No respiratory distress. No has no wheezes. No rales.  Skin: Skin is warm and dry. Not diaphoretic.  Psychiatric: Normal mood and affect. Behavior is normal.      Assessment & Plan:    See Problem List for Assessment and Plan of chronic medical problems.    This visit occurred during the SARS-CoV-2 public health emergency.  Safety protocols were in place, including screening questions prior to the visit, additional usage of staff PPE, and extensive cleaning of exam room while observing appropriate contact time as indicated for disinfecting solutions.    This encounter was created in error - please disregard.

## 2019-10-26 ENCOUNTER — Encounter: Payer: BC Managed Care – PPO | Admitting: Internal Medicine

## 2019-11-24 ENCOUNTER — Other Ambulatory Visit: Payer: Self-pay | Admitting: Internal Medicine

## 2020-01-01 ENCOUNTER — Other Ambulatory Visit: Payer: Self-pay | Admitting: Internal Medicine

## 2020-01-22 ENCOUNTER — Encounter: Payer: Self-pay | Admitting: Internal Medicine

## 2020-01-23 ENCOUNTER — Encounter: Payer: Self-pay | Admitting: Family Medicine

## 2020-01-23 ENCOUNTER — Telehealth (INDEPENDENT_AMBULATORY_CARE_PROVIDER_SITE_OTHER): Payer: BC Managed Care – PPO | Admitting: Family Medicine

## 2020-01-23 DIAGNOSIS — N390 Urinary tract infection, site not specified: Secondary | ICD-10-CM | POA: Diagnosis not present

## 2020-01-23 MED ORDER — CIPROFLOXACIN HCL 500 MG PO TABS: 500.0000 mg | ORAL_TABLET | Freq: Two times a day (BID) | ORAL | 0 refills | Status: DC

## 2020-01-23 NOTE — Progress Notes (Signed)
Subjective:    Patient ID: Rebekah Kennedy, female    DOB: 15-Oct-1955, 64 y.o.   MRN: [id number]  HPI Virtual Visit via Video Note  I connected with the patient on 01/23/20 at 12:40 PM EDT by a video enabled telemedicine application and verified that I am speaking with the correct person using two identifiers.  Location patient: home Location provider:work or home office Persons participating in the virtual visit: patient, provider  I discussed the limitations of evaluation and management by telemedicine and the availability of in person appointments. The patient expressed understanding and agreed to proceed.   HPI: Here for 5 days of urinary frequency and burning. No back pain or nausea. No fever or vaginal DC. She has been using Monistat with no relief. She is drinking lots of water.    ROS: See pertinent positives and negatives per HPI.  Past Medical History:  Diagnosis Date  . ASTHMA NOS W/ACUTE EXACERBATION 07/10/2010  . Cataract 2010   surgery  . Cervical dysplasia   . DEPRESSION 03/16/2007  . DIABETES MELLITUS, TYPE II 12/01/2006   dx 2004  . Dysmenorrhea   . Fibroid   . HYPERLIPIDEMIA 12/01/2006  . HYPERTENSION 07/07/2006  . Neuropathy   . Obesity   . Osteopenia 05/2017   T score -1.5 FRAX 2.6% / 0.1%  . Retinal edema    gets steriod inj in the eyes    . Sleep apnea    HAS CPAP BUT ADMITS DOES NOT USE JUDICIALLY   . Splenomegaly    in college  . Stroke (cerebrum) ([place]) 2020   left side pt states  . Stroke [facility]) 2012   R pontine, mild residual left hemiparesis  . TIA (transient ischemic attack) 2012   2 TIAs 1 week apart of each other     Past Surgical History:  Procedure Laterality Date  . 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 APPENDECTOMY    . BIOPSY THYROID  05/02/11   Nonneoplastic goiter  . BREAST BIOPSY    . BREAST LUMPECTOMY WITH RADIOACTIVE SEED LOCALIZATION Left 07/25/2017   Procedure: LEFT BREAST LUMPECTOMY WITH RADIOACTIVE SEED LOCALIZATION;   Surgeon: Coralie Keens, MD;  Location: [place];  Service: General;  Laterality: Left;  . BREAST SURGERY     Reduction  . COLONOSCOPY    . COLPOSCOPY    . DILATION AND CURETTAGE OF UTERUS  1975   DUB  . EYE SURGERY     Laser  . GASTRIC ROUX-EN-Y N/A 08/12/2017   Procedure: LAPAROSCOPIC ROUX-EN-Y GASTRIC BYPASS WITH HIATAL HERNIA REPAIR AND UPPER ENDOSCOPY;  Surgeon: [name], [name], MD;  Location: [facility];  Service: General;  Laterality: N/A;  . GYNECOLOGIC CRYOSURGERY    . MYOMECTOMY    . OVARIAN CYST REMOVAL    . PELVIC LAPAROSCOPY  75,88   DL lysis of adhesions  . STOMACH SURGERY      Family History  Problem Relation Age of Onset  . Cancer Maternal Grandmother        Colon Cancer  . Asthma Maternal Grandmother   . Colon cancer Maternal Grandmother   . Diabetes Father   . Heart disease Father   . Hypertension Father   . Hyperlipidemia Father   . Stroke Father   . Hypertension Mother   . Heart disease Mother   . Ovarian cancer Mother   . Cancer Mother        Lung cancer  . Asthma Mother   . COPD Mother   .  Hyperlipidemia Mother   . Diabetes Brother   . Hypertension Brother   . Kidney disease Brother   . Asthma Brother   . Heart disease Brother   . Hyperlipidemia Brother   . Cancer Brother        Prostate  . Colon polyps Brother   . Graves' disease Sister   . Diabetes Sister   . Breast cancer Sister 11  . Graves' disease Paternal Grandmother   . Hypertension Paternal Grandmother   . Heart disease Paternal Grandmother   . Alzheimer's disease Paternal Grandmother   . Cancer Maternal Grandfather   . Heart failure Brother   . Heart failure Brother   . Esophageal cancer Neg Hx   . Rectal cancer Neg Hx   . Stomach cancer Neg Hx      Current Outpatient Medications:  .  albuterol (PROVENTIL HFA;VENTOLIN HFA) 108 (90 Base) MCG/ACT inhaler, Inhale 2 puffs into the lungs every 4 (four) hours as needed for wheezing or shortness of breath., Disp: 18 g, Rfl:  2 .  amLODipine (NORVASC) 5 MG tablet, Take 1 tablet (5 mg total) by mouth daily., Disp: 90 tablet, Rfl: 1 .  aspirin EC 81 MG EC tablet, Take 1 tablet (81 mg total) by mouth daily., Disp: 30 tablet, Rfl: 0 .  Biotin 5000 MCG TABS, Take 10,000 mcg by mouth daily. , Disp: , Rfl:  .  calcium-vitamin D (OSCAL WITH D) 500-200 MG-UNIT tablet, Take 1 tablet by mouth., Disp: , Rfl:  .  Cholecalciferol (VITAMIN D3 PO), Take 2,000 Units by mouth daily., Disp: , Rfl:  .  clopidogrel (PLAVIX) 75 MG tablet, TAKE 1 TABLET BY MOUTH EVERY DAY, Disp: 90 tablet, Rfl: 1 .  FARXIGA 5 MG TABS tablet, TAKE 1 TABLET BY MOUTH EVERY DAY, Disp: 90 tablet, Rfl: 0 .  hydrALAZINE (APRESOLINE) 25 MG tablet, Take 1 tablet (25 mg total) by mouth 3 (three) times daily., Disp: 270 tablet, Rfl: 1 .  losartan (COZAAR) 100 MG tablet, TAKE 1 TAB BY MOUTH ONCE DAILY., Disp: 90 tablet, Rfl: 0 .  metoprolol succinate (TOPROL-XL) 100 MG 24 hr tablet, TAKE 1 TABLET BY MOUTH DAILY WITH OR IMMEDIATELY FOLLOWING A MEAL, Disp: 90 tablet, Rfl: 1 .  montelukast (SINGULAIR) 10 MG tablet, TAKE 1 TABLET BY MOUTH EVERY DAY, Disp: 90 tablet, Rfl: 1 .  OVER THE COUNTER MEDICATION, Take 1 tablet by mouth daily. BariMelt, Disp: , Rfl:  .  rosuvastatin (CRESTOR) 10 MG tablet, TAKE 1 TABLET BY MOUTH EVERY DAY, Disp: 90 tablet, Rfl: 3 .  sitaGLIPtin (JANUVIA) 100 MG tablet, Take 1 tablet (100 mg total) by mouth daily., Disp: 90 tablet, Rfl: 1 .  tacrolimus (PROTOPIC) 0.1 % ointment, APPLY TO AFFECTED AREA TWICE A DAY (Patient taking differently: daily as needed. ), Disp: 30 g, Rfl: 0 .  ciprofloxacin (CIPRO) 500 MG tablet, Take 1 tablet (500 mg total) by mouth 2 (two) times daily., Disp: 14 tablet, Rfl: 0  EXAM:  VITALS per patient if applicable:  GENERAL: alert, oriented, appears well and in no acute distress  HEENT: atraumatic, conjunttiva clear, no obvious abnormalities on inspection of external nose and ears  NECK: normal movements of the head  and neck  LUNGS: on inspection no signs of respiratory distress, breathing rate appears normal, no obvious gross SOB, gasping or wheezing  CV: no obvious cyanosis  MS: moves all visible extremities without noticeable abnormality  PSYCH/NEURO: pleasant and cooperative, no obvious depression or anxiety, speech and thought processing  grossly intact  ASSESSMENT AND PLAN: UTI, treat with Cipro for 7 days. Recheck as needed.  Alysia Penna, MD  Discussed the following assessment and plan:  No diagnosis found.     I discussed the assessment and treatment plan with the patient. The patient was provided an opportunity to ask questions and all were answered. The patient agreed with the plan and demonstrated an understanding of the instructions.   The patient was advised to call back or seek an in-person evaluation if the symptoms worsen or if the condition fails to improve as anticipated.     Review of Systems     Objective:   Physical Exam        Assessment & Plan:

## 2020-01-27 ENCOUNTER — Other Ambulatory Visit: Payer: Self-pay | Admitting: Internal Medicine

## 2020-02-08 NOTE — Progress Notes (Signed)
Subjective:    Patient ID: Rebekah Kennedy, female    DOB: January 08, 1956, 64 y.o.   MRN: [id number]  HPI The patient is here for follow up on UTI.   Had video visit 9/4 for UTI.  Empirically treated with cipro x 7 days.    Her symptoms did not go away.  She tried otc monistat x 2.  It gets better, then worse.  She is unsure if she has an infection or possibly a yeast infection.  She has had a UTI in the past and did not have pain with urinating.  She ended up in the hospital.  Her primary symptom is intermittent urinary frequency and vulvar itch.  She does have mild urinary incontinence at times and does wear a pad.   Medications and allergies reviewed with patient and updated if appropriate.  Patient Active Problem List   Diagnosis Date Noted  . Left hemiparesis ([place]) 10/24/2019  . Insect bite of arm 09/21/2019  . Insect bite of leg 09/21/2019  . Tick bite 09/21/2019  . Urinary frequency 04/24/2019  . Dermatitis of eyelid, contact or allergic 07/31/2018  . Right sided weakness 07/14/2018  . Left thalamic infarction ([place]) 07/12/2018  . Carotid artery disease ([place]) 07/09/2018  . Numbness and tingling 12/17/2017  . Right foot drop 12/17/2017  . Atherosclerosis of aorta ([place]) 10/09/2017  . Bariatric surgery status, 07/2017 08/19/2017  . Constipation 08/19/2017  . Hypertensive urgency 05/23/2017  . Greater trochanteric bursitis of both hips 12/19/2016  . OSA (obstructive sleep apnea) 07/26/2016  . Hypersomnia 06/11/2016  . Diabetes ([place]) 07/19/2015  . History of colonic polyps 12/01/2014  . Morbidly obese ([place]) 10/05/2013  . Partial nontraumatic tear of right rotator cuff 09/21/2013  . Goiter 12/10/2011  . Asthma 12/10/2011  . 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 History of TIA (transient ischemic attack) 02/13/2011  . PROTEINURIA, MILD 09/15/2009  . Hyperlipidemia 12/01/2006  . Uncontrolled hypertension 07/07/2006    Current Outpatient Medications on File Prior to  Visit  Medication Sig Dispense Refill  . albuterol (PROVENTIL HFA;VENTOLIN HFA) 108 (90 Base) MCG/ACT inhaler Inhale 2 puffs into the lungs every 4 (four) hours as needed for wheezing or shortness of breath. 18 g 2  . amLODipine (NORVASC) 5 MG tablet Take 1 tablet (5 mg total) by mouth daily. 90 tablet 1  . aspirin EC 81 MG EC tablet Take 1 tablet (81 mg total) by mouth daily. 30 tablet 0  . Biotin 5000 MCG TABS Take 10,000 mcg by mouth daily.     . calcium-vitamin D (OSCAL WITH D) 500-200 MG-UNIT tablet Take 1 tablet by mouth.    . Cholecalciferol (VITAMIN D3 PO) Take 2,000 Units by mouth daily.    . clopidogrel (PLAVIX) 75 MG tablet TAKE 1 TABLET BY MOUTH EVERY DAY 90 tablet 1  . FARXIGA 5 MG TABS tablet TAKE 1 TABLET BY MOUTH EVERY DAY 90 tablet 0  . hydrALAZINE (APRESOLINE) 25 MG tablet Take 1 tablet (25 mg total) by mouth 3 (three) times daily. 270 tablet 1  . losartan (COZAAR) 100 MG tablet TAKE 1 TAB BY MOUTH ONCE DAILY. 90 tablet 1  . metoprolol succinate (TOPROL-XL) 100 MG 24 hr tablet TAKE 1 TABLET BY MOUTH DAILY WITH OR IMMEDIATELY FOLLOWING A MEAL 90 tablet 1  . montelukast (SINGULAIR) 10 MG tablet TAKE 1 TABLET BY MOUTH EVERY DAY 90 tablet 1  . OVER THE COUNTER MEDICATION Take 1 tablet by mouth daily. BariMelt    .  rosuvastatin (CRESTOR) 10 MG tablet TAKE 1 TABLET BY MOUTH EVERY DAY 90 tablet 3  . sitaGLIPtin (JANUVIA) 100 MG tablet Take 1 tablet (100 mg total) by mouth daily. 90 tablet 1  . tacrolimus (PROTOPIC) 0.1 % ointment APPLY TO AFFECTED AREA TWICE A DAY (Patient taking differently: daily as needed. ) 30 g 0   No current facility-administered medications on file prior to visit.    Past Medical History:  Diagnosis Date  . ASTHMA NOS W/ACUTE EXACERBATION 07/10/2010  . Cataract 2010   surgery  . Cervical dysplasia   . DEPRESSION 03/16/2007  . DIABETES MELLITUS, TYPE II 12/01/2006   dx 2004  . Dysmenorrhea   . Fibroid   . HYPERLIPIDEMIA 12/01/2006  . HYPERTENSION  07/07/2006  . Neuropathy   . Obesity   . Osteopenia 05/2017   T score -1.5 FRAX 2.6% / 0.1%  . Retinal edema    gets steriod inj in the eyes    . Sleep apnea    HAS CPAP BUT ADMITS DOES NOT USE JUDICIALLY   . Splenomegaly    in college  . Stroke (cerebrum) ([place]) 2020   left side pt states  . Stroke [facility]) 2012   R pontine, mild residual left hemiparesis  . TIA (transient ischemic attack) 2012   2 TIAs 1 week apart of each other     Past Surgical History:  Procedure Laterality Date  . 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 APPENDECTOMY    . BIOPSY THYROID  05/02/11   Nonneoplastic goiter  . BREAST BIOPSY    . BREAST LUMPECTOMY WITH RADIOACTIVE SEED LOCALIZATION Left 07/25/2017   Procedure: LEFT BREAST LUMPECTOMY WITH RADIOACTIVE SEED LOCALIZATION;  Surgeon: Coralie Keens, MD;  Location: [place];  Service: General;  Laterality: Left;  . BREAST SURGERY     Reduction  . COLONOSCOPY    . COLPOSCOPY    . DILATION AND CURETTAGE OF UTERUS  1975   DUB  . EYE SURGERY     Laser  . GASTRIC ROUX-EN-Y N/A 08/12/2017   Procedure: LAPAROSCOPIC ROUX-EN-Y GASTRIC BYPASS WITH HIATAL HERNIA REPAIR AND UPPER ENDOSCOPY;  Surgeon: [name], [name], MD;  Location[facility];  Service: General;  Laterality: N/A;  . GYNECOLOGIC CRYOSURGERY    . MYOMECTOMY    . OVARIAN CYST REMOVAL    . PELVIC LAPAROSCOPY  75,88   DL lysis of adhesions  . STOMACH SURGERY      Social History   Socioeconomic History  . Marital status: Single    Spouse name: Not on file  . Number of children: 2  . Years of education: 16  . Highest education level: Bachelor's degree (e.g., BA, AB, BS)  Occupational History  . Occupation: Product manager: [employer]  Tobacco Use  . Smoking status: Never Smoker  . Smokeless tobacco: Never Used  Vaping Use  . Vaping Use: Never used  Substance and Sexual Activity  . Alcohol use: Yes    Alcohol/week: 0.0 standard drinks    Comment: very rare  . Drug use: No   . Sexual activity: Not Currently    Birth control/protection: Post-menopausal    Comment: 1st intercourse 64 yo-Fewer than 5 partners  Other Topics Concern  . 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 Difficulty of Paying Living Expenses: Not on file  Food Insecurity:   . Worried About Charity fundraiser in the Last Year: Not on file  . Ran Out of Food in the Last Year: Not on file  Transportation Needs:   . Lack of Transportation (Medical): Not on file  . Lack of Transportation (Non-Medical): Not on file  Physical Activity:   . Days of Exercise per Week: Not on file  . Minutes of Exercise per Session: Not on file  Stress:   . Feeling of Stress : Not on file  Social Connections:   . Frequency of Communication with Friends and Family: Not on file  . Frequency of Social Gatherings with Friends and Family: Not on file  . Attends Religious Services: Not on file  . Active Member of Clubs or Organizations: Not on file  . Attends Archivist Meetings: Not on file  . Marital Status: Not on file    Family History  Problem Relation Age of Onset  . Cancer Maternal Grandmother        Colon Cancer  . Asthma Maternal Grandmother   . Colon cancer Maternal Grandmother   . Diabetes Father   . Heart disease Father   . Hypertension Father   . Hyperlipidemia Father   . Stroke Father   . Hypertension Mother   . Heart disease Mother   . Ovarian cancer Mother   . Cancer Mother        Lung cancer  . Asthma Mother   . COPD Mother   . Hyperlipidemia Mother   . Diabetes Brother   . Hypertension Brother   . Kidney disease Brother   . Asthma Brother   . Heart disease Brother   . Hyperlipidemia Brother   . Cancer Brother        Prostate  .  Colon polyps Brother   . Graves' disease Sister   . Diabetes Sister   . Breast cancer Sister 62  . Graves' disease Paternal Grandmother   . Hypertension Paternal Grandmother   . Heart disease Paternal Grandmother   . Alzheimer's disease Paternal Grandmother   . Cancer Maternal Grandfather   . Heart failure Brother   . Heart failure Brother   . Esophageal cancer Neg Hx   . Rectal cancer Neg Hx   . Stomach cancer Neg Hx     Review of Systems  Constitutional: Negative for chills and fever.  Gastrointestinal: Negative for abdominal pain and nausea.  Genitourinary: Positive for frequency (varies - some day more than other). Negative for difficulty urinating, dysuria, flank pain, hematuria, pelvic pain, vaginal bleeding and vaginal discharge.       Vulva itch, mild incontinence at times       Objective:   Vitals:   02/09/20 1521  BP: 130/70  Pulse: 65  Temp: 98.2 F (36.8 C)  SpO2: 97%   BP Readings from Last 3 Encounters:  02/09/20 130/70  09/21/19 (!) 176/90  07/24/19 (!) 148/80   Wt Readings from Last 3 Encounters:  02/09/20 170 lb (77.1 kg)  09/21/19 167 lb 12.8 oz (76.1 kg)  07/24/19 165 lb 12.8 oz (75.2 kg)   Body mass index is 28.29 kg/m.   Physical Exam Constitutional:      General: She is not in acute distress.    Appearance: Normal appearance. She is  not ill-appearing.  HENT:     Head: Normocephalic and atraumatic.  Abdominal:     General: There is no distension.     Palpations: Abdomen is soft.     Tenderness: There is no abdominal tenderness. There is no right CVA tenderness or left CVA tenderness.     Comments: Obese  Skin:    General: Skin is warm and dry.  Neurological:     Mental Status: She is alert.            Assessment & Plan:    See Problem List for Assessment and Plan of chronic medical problems.    This visit occurred during the SARS-CoV-2 public health emergency.  Safety protocols were in place, including screening  questions prior to the visit, additional usage of staff PPE, and extensive cleaning of exam room while observing appropriate contact time as indicated for disinfecting solutions.

## 2020-02-09 ENCOUNTER — Telehealth: Payer: Self-pay | Admitting: Internal Medicine

## 2020-02-09 ENCOUNTER — Other Ambulatory Visit: Payer: Self-pay

## 2020-02-09 ENCOUNTER — Ambulatory Visit (INDEPENDENT_AMBULATORY_CARE_PROVIDER_SITE_OTHER): Payer: BC Managed Care – PPO | Admitting: Internal Medicine

## 2020-02-09 ENCOUNTER — Encounter: Payer: Self-pay | Admitting: Internal Medicine

## 2020-02-09 VITALS — BP 130/70 | HR 65 | Temp 98.2°F | Ht 65.0 in | Wt 170.0 lb

## 2020-02-09 DIAGNOSIS — R000 Tachycardia, unspecified: Secondary | ICD-10-CM | POA: Diagnosis not present

## 2020-02-09 DIAGNOSIS — Z23 Encounter for immunization: Secondary | ICD-10-CM | POA: Diagnosis not present

## 2020-02-09 DIAGNOSIS — L292 Pruritus vulvae: Secondary | ICD-10-CM

## 2020-02-09 DIAGNOSIS — R350 Frequency of micturition: Secondary | ICD-10-CM | POA: Diagnosis not present

## 2020-02-09 DIAGNOSIS — I10 Essential (primary) hypertension: Secondary | ICD-10-CM | POA: Diagnosis not present

## 2020-02-09 MED ORDER — FLUCONAZOLE 150 MG PO TABS
150.0000 mg | ORAL_TABLET | Freq: Once | ORAL | 0 refills | Status: AC
Start: 2020-02-09 — End: 2020-02-09

## 2020-02-09 MED ORDER — METOPROLOL SUCCINATE ER 100 MG PO TB24: ORAL_TABLET | ORAL | 1 refills | Status: DC

## 2020-02-09 NOTE — Assessment & Plan Note (Signed)
Acute Mild and intermittent Her urinary symptoms do not really sound like UTI, but will check UA and urine culture

## 2020-02-09 NOTE — Telephone Encounter (Signed)
Patient dropped of disability forms. Placed in Brittany's box.

## 2020-02-09 NOTE — Patient Instructions (Addendum)
  Give a urine sample downstairs.       Medications reviewed and updated.  Changes include :   Diflucan x 1 then repeat after 72 hrs.   Your prescription(s) have been submitted to your pharmacy. Please take as directed and contact our office if you believe you are having problem(s) with the medication(s).    Flu immunization administered today.    Follow up in 2 month

## 2020-02-09 NOTE — Assessment & Plan Note (Signed)
Acute Complaining of vulvar itch.  No vaginal discharge Has done Monistat twice over-the-counter and it helped temporarily, but still has symptoms ?  Yeast infection versus lichen sclerosis We will rule out UTI for urinary frequency Diflucan once today and repeat after 72 hours Depending on urine results and response to Diflucan may consider clobetasol cream Has GYN appointment in 2 months

## 2020-02-10 LAB — URINALYSIS, ROUTINE W REFLEX MICROSCOPIC
Bacteria, UA: NONE SEEN /HPF
Bilirubin Urine: NEGATIVE
Hgb urine dipstick: NEGATIVE
Hyaline Cast: NONE SEEN /LPF
Ketones, ur: NEGATIVE
Leukocytes,Ua: NEGATIVE
Nitrite: NEGATIVE
Specific Gravity, Urine: 1.03 (ref 1.001–1.03)
pH: 6 (ref 5.0–8.0)

## 2020-02-10 LAB — URINE CULTURE: Result:: NO GROWTH

## 2020-02-11 NOTE — Telephone Encounter (Signed)
Forms have been completed & Placed in providers box to review and sign.  

## 2020-02-12 DIAGNOSIS — Z0279 Encounter for issue of other medical certificate: Secondary | ICD-10-CM

## 2020-02-12 NOTE — Telephone Encounter (Signed)
Renewal forms have been signed, Faxed to [phone], Copy sent to scan &Charged for.   LVM to inform patient original is ready to be picked up.

## 2020-02-24 ENCOUNTER — Other Ambulatory Visit: Payer: Self-pay | Admitting: Internal Medicine

## 2020-03-07 ENCOUNTER — Ambulatory Visit: Payer: BC Managed Care – PPO | Admitting: Neurology

## 2020-03-13 ENCOUNTER — Other Ambulatory Visit: Payer: Self-pay | Admitting: Internal Medicine

## 2020-03-24 ENCOUNTER — Other Ambulatory Visit: Payer: Self-pay

## 2020-03-24 ENCOUNTER — Telehealth (INDEPENDENT_AMBULATORY_CARE_PROVIDER_SITE_OTHER): Payer: BC Managed Care – PPO | Admitting: Neurology

## 2020-03-24 ENCOUNTER — Encounter: Payer: Self-pay | Admitting: Neurology

## 2020-03-24 VITALS — Ht 66.0 in | Wt 167.0 lb

## 2020-03-24 DIAGNOSIS — E1142 Type 2 diabetes mellitus with diabetic polyneuropathy: Secondary | ICD-10-CM | POA: Diagnosis not present

## 2020-03-24 DIAGNOSIS — R2689 Other abnormalities of gait and mobility: Secondary | ICD-10-CM

## 2020-03-24 DIAGNOSIS — I69398 Other sequelae of cerebral infarction: Secondary | ICD-10-CM | POA: Diagnosis not present

## 2020-03-24 NOTE — Progress Notes (Signed)
   Virtual Visit via Video Note The purpose of this virtual visit is to provide medical care while limiting exposure to the novel coronavirus.    Consent was obtained for video visit:  Yes.   Answered questions that patient had about telehealth interaction:  Yes.   I discussed the limitations, risks, security and privacy concerns of performing an evaluation and management service by telemedicine. I also discussed with the patient that there may be a patient responsible charge related to this service. The patient expressed understanding and agreed to proceed.  Pt location: Home Physician Location: office Name of referring provider:  Binnie Rail, MD I connected with Rebekah Kennedy at patients initiation/request on 03/24/2020 at  7:50 AM EDT by video enabled telemedicine application and verified that I am speaking with the correct person using two identifiers. Pt MRN:  [id number] Pt DOB:  01-Jun-1955 Video Participants:  Rebekah Kennedy   History of Present Illness: This is a 64 y.o. female with diabetes mellitus, hyperlipidemia, hypertension, depression, stroke (2012, 2020), and right peroneal neuropathy returning for follow-up of neuropathy, cerebrovascular disease, and new complaints of imbalance.  She has not had any interval TIAs, illnesses, or hospitalizations.  She has fallen a few times, and tends to stumble frequently. She has not been using AFO for right peroneal neuropathy for over a year.   She uses a cane when walking outside her home. She has not done physical therapy.    She continues to have spells of dizziness, which can occur several times per week.    Observations/Objective:   Patient is awake, alert, and appears comfortable.  Oriented x 4.   Extraocular muscles are intact. No ptosis.  Face is symmetric.  Speech is not dysarthric.  Antigravity in all extremities.  No pronator drift. Gait appears stable, unassisted, no foot drop is observed.  She is able to stand on heels  and toes.   DATA: Lab Results  Component Value Date   CHOL 149 04/24/2019   HDL 62.20 04/24/2019   LDLCALC 63 04/24/2019   LDLDIRECT 126.7 03/24/2013   TRIG 116.0 04/24/2019   CHOLHDL 2 04/24/2019    Lab Results  Component Value Date   HGBA1C 6.8 (H) 07/24/2019     Assessment and Plan:  1.  Gait instability, possibly related to ataxia and known history of right peroneal neuropathy  - Start PT for gait training  - Always use a cane  - Fall precautions discussed  2.  Cerebrovascular disease with R pontine stroke in 2012 with mild LHP + left thalamic stroke with R side ataxia  - Continue ASA 81mg  + plavix 75mg  daily  - Continue Crestor 10mg  daily  - Continue diabetes and hypertensive management as per primary  3.  Diabetic polyneuropathy, R peroneal mononeuropathy (stable)  - Praised her for excellent diabetes management, HbA1c 6.8    Follow Up Instructions:   I discussed the assessment and treatment plan with the patient. The patient was provided an opportunity to ask questions and all were answered. The patient agreed with the plan and demonstrated an understanding of the instructions.   The patient was advised to call back or seek an in-person evaluation if the symptoms worsen or if the condition fails to improve as anticipated.  Follow-up in 9 months   [place], DO

## 2020-04-18 ENCOUNTER — Ambulatory Visit: Payer: BC Managed Care – PPO | Admitting: Internal Medicine

## 2020-04-21 ENCOUNTER — Encounter: Payer: Self-pay | Admitting: Obstetrics and Gynecology

## 2020-04-26 NOTE — Assessment & Plan Note (Addendum)
Chronic With residual R sided weakness-this did seem to get worse and she is currently doing physical therapy again Using a cane to ambulate Stressed good blood pressure control and good sugar control Continue Plavix 75 mg daily, aspirin 81 mg daily Crestor 10 mg daily

## 2020-04-26 NOTE — Progress Notes (Signed)
Subjective:    Patient ID: Rebekah Kennedy, female    DOB: 1955-12-19, 64 y.o.   MRN: [id number]  HPI The patient is here for follow up of their chronic medical problems, including htn, DM, hyperlipidemia, h/o CVA, asthma, obesity  She was stumbling more and neurology referred her back to PT, which she is currently doing.  She has seen some improvement.  She uses a cane when she walks.  She has notice increased R sided weakness.  BP at home 130-142/80's.  She admits she is not always taking the hydralazine 3 times a day-only takes it twice a day most days.  She is exercising regularly.     Medications and allergies reviewed with patient and updated if appropriate.  Patient Active Problem List   Diagnosis Date Noted  . Vulvar itching 02/09/2020  . Dermatitis of eyelid, contact or allergic 07/31/2018  . History of CVA with residual deficit, R sided weakness 07/14/2018  . Left thalamic infarction ([place]) 07/12/2018  . Carotid artery disease ([place]) 07/09/2018  . Numbness and tingling 12/17/2017  . Right foot drop 12/17/2017  . Atherosclerosis of aorta ([place]) 10/09/2017  . Bariatric surgery status, 07/2017 08/19/2017  . Constipation 08/19/2017  . Hypertensive urgency 05/23/2017  . Greater trochanteric bursitis of both hips 12/19/2016  . OSA (obstructive sleep apnea) 07/26/2016  . Hypersomnia 06/11/2016  . Diabetes ([place]) 07/19/2015  . History of colonic polyps 12/01/2014  . Partial nontraumatic tear of right rotator cuff 09/21/2013  . Goiter 12/10/2011  . Asthma 12/10/2011  . 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 History of TIA (transient ischemic attack) 02/13/2011  . PROTEINURIA, MILD 09/15/2009  . Hyperlipidemia 12/01/2006  . Uncontrolled hypertension 07/07/2006    Current Outpatient Medications on File Prior to Visit  Medication Sig Dispense Refill  . albuterol (PROVENTIL HFA;VENTOLIN HFA) 108 (90 Base) MCG/ACT inhaler Inhale 2 puffs into the lungs every 4 (four) hours as  needed for wheezing or shortness of breath. 18 g 2  . amLODipine (NORVASC) 5 MG tablet Take 1 tablet (5 mg total) by mouth daily. 90 tablet 1  . aspirin EC 81 MG EC tablet Take 1 tablet (81 mg total) by mouth daily. 30 tablet 0  . Biotin 5000 MCG TABS Take 10,000 mcg by mouth daily.     . calcium-vitamin D (OSCAL WITH D) 500-200 MG-UNIT tablet Take 1 tablet by mouth.    . Cholecalciferol (VITAMIN D3 PO) Take 2,000 Units by mouth daily.    . clopidogrel (PLAVIX) 75 MG tablet TAKE 1 TABLET BY MOUTH EVERY DAY 90 tablet 1  . FARXIGA 5 MG TABS tablet TAKE 1 TABLET BY MOUTH EVERY DAY 90 tablet 0  . hydrALAZINE (APRESOLINE) 25 MG tablet Take 1 tablet (25 mg total) by mouth 3 (three) times daily. 270 tablet 1  . JANUVIA 100 MG tablet TAKE 1 TABLET BY MOUTH EVERY DAY 90 tablet 1  . losartan (COZAAR) 100 MG tablet TAKE 1 TAB BY MOUTH ONCE DAILY. 90 tablet 1  . metoprolol succinate (TOPROL-XL) 100 MG 24 hr tablet TAKE 1 TABLET BY MOUTH DAILY WITH OR IMMEDIATELY FOLLOWING A MEAL 90 tablet 1  . montelukast (SINGULAIR) 10 MG tablet TAKE 1 TABLET BY MOUTH EVERY DAY 90 tablet 1  . OVER THE COUNTER MEDICATION Take 1 tablet by mouth daily. BariMelt    . rosuvastatin (CRESTOR) 10 MG tablet TAKE 1 TABLET BY MOUTH EVERY DAY 90 tablet 3  . tacrolimus (PROTOPIC) 0.1 % ointment APPLY  TO AFFECTED AREA TWICE A DAY (Patient taking differently: daily as needed. ) 30 g 0   No current facility-administered medications on file prior to visit.    Past Medical History:  Diagnosis Date  . ASTHMA NOS W/ACUTE EXACERBATION 07/10/2010  . Cataract 2010   surgery  . Cervical dysplasia   . DEPRESSION 03/16/2007  . DIABETES MELLITUS, TYPE II 12/01/2006   dx 2004  . Dysmenorrhea   . Fibroid   . HYPERLIPIDEMIA 12/01/2006  . HYPERTENSION 07/07/2006  . Neuropathy   . Obesity   . Osteopenia 05/2017   T score -1.5 FRAX 2.6% / 0.1%  . Retinal edema    gets steriod inj in the eyes    . Sleep apnea    HAS CPAP BUT ADMITS DOES NOT  USE JUDICIALLY   . Splenomegaly    in college  . Stroke (cerebrum) ([place]) 2020   left side pt states  . Stroke [facility]) 2012   R pontine, mild residual left hemiparesis  . TIA (transient ischemic attack) 2012   2 TIAs 1 week apart of each other     Past Surgical History:  Procedure Laterality Date  . 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 APPENDECTOMY    . BIOPSY THYROID  05/02/11   Nonneoplastic goiter  . BREAST BIOPSY    . BREAST LUMPECTOMY WITH RADIOACTIVE SEED LOCALIZATION Left 07/25/2017   Procedure: LEFT BREAST LUMPECTOMY WITH RADIOACTIVE SEED LOCALIZATION;  Surgeon: Coralie Keens, MD;  Location: [place];  Service: General;  Laterality: Left;  . BREAST SURGERY     Reduction  . COLONOSCOPY    . COLPOSCOPY    . DILATION AND CURETTAGE OF UTERUS  1975   DUB  . EYE SURGERY     Laser  . GASTRIC ROUX-EN-Y N/A 08/12/2017   Procedure: LAPAROSCOPIC ROUX-EN-Y GASTRIC BYPASS WITH HIATAL HERNIA REPAIR AND UPPER ENDOSCOPY;  Surgeon: [name], [name], MD;  Location[facility];  Service: General;  Laterality: N/A;  . GYNECOLOGIC CRYOSURGERY    . MYOMECTOMY    . OVARIAN CYST REMOVAL    . PELVIC LAPAROSCOPY  75,88   DL lysis of adhesions  . STOMACH SURGERY      Social History   Socioeconomic History  . Marital status: Single    Spouse name: Not on file  . Number of children: 2  . Years of education: 16  . Highest education level: Bachelor's degree (e.g., BA, AB, BS)  Occupational History  . Occupation: Product manager: [employer]  Tobacco Use  . Smoking status: Never Smoker  . Smokeless tobacco: Never Used  Vaping Use  . Vaping Use: Never used  Substance and Sexual Activity  . Alcohol use: Yes    Alcohol/week: 0.0 standard drinks    Comment: very rare  . Drug use: No  . Sexual activity: Not Currently    Birth control/protection: Post-menopausal    Comment: 1st intercourse 64 yo-Fewer than 5 partners  Other Topics Concern  . 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 Difficulty of Paying Living Expenses: Not on file  Food Insecurity:   . Worried About  Running Out of Food in the Last Year: Not on file  . Ran Out of Food in the Last Year: Not on file  Transportation Needs:   . Lack of Transportation (Medical): Not on file  . Lack of Transportation (Non-Medical): Not on file  Physical Activity:   . Days of Exercise per Week: Not on file  . Minutes of Exercise per Session: Not on file  Stress:   . Feeling of Stress : Not on file  Social Connections:   . Frequency of Communication with Friends and Family: Not on file  . Frequency of Social Gatherings with Friends and Family: Not on file  . Attends Religious Services: Not on file  . Active Member of Clubs or Organizations: Not on file  . Attends Archivist Meetings: Not on file  . Marital Status: Not on file    Family History  Problem Relation Age of Onset  . Cancer Maternal Grandmother        Colon Cancer  . Asthma Maternal Grandmother   . Colon cancer Maternal Grandmother   . Diabetes Father   . Heart disease Father   . Hypertension Father   . Hyperlipidemia Father   . Stroke Father   . Hypertension Mother   . Heart disease Mother   . Ovarian cancer Mother   . Cancer Mother        Lung cancer  . Asthma Mother   . COPD Mother   . Hyperlipidemia Mother   . Diabetes Brother   . Hypertension Brother   . Kidney disease Brother   . Asthma Brother   . Heart disease Brother   . Hyperlipidemia Brother   . Cancer Brother        Prostate  . Colon polyps Brother   . Graves' disease Sister   . Diabetes Sister   . Breast cancer Sister 53  . Graves' disease Paternal Grandmother   . Hypertension Paternal Grandmother   . Heart disease  Paternal Grandmother   . Alzheimer's disease Paternal Grandmother   . Cancer Maternal Grandfather   . Heart failure Brother   . Heart failure Brother   . Esophageal cancer Neg Hx   . Rectal cancer Neg Hx   . Stomach cancer Neg Hx     Review of Systems  Constitutional: Negative for chills and fever.  Respiratory: Positive for chest tightness (related to ashtma - occ, 1-2 / month) and wheezing (related to ashtma - occ, 1-2 / month). Negative for cough and shortness of breath.   Cardiovascular: Negative for chest pain, palpitations and leg swelling.  Genitourinary: Positive for frequency.  Neurological: Positive for dizziness (occ) and weakness. Negative for light-headedness and headaches.       Objective:   Vitals:   04/27/20 0901 04/27/20 0929  BP: 140/82 130/78  Pulse: 73   Temp: 98 F (36.7 C)   SpO2: 98%    BP Readings from Last 3 Encounters:  04/27/20 130/78  02/09/20 130/70  09/21/19 (!) 176/90   Wt Readings from Last 3 Encounters:  04/27/20 171 lb (77.6 kg)  03/24/20 167 lb (75.8 kg)  02/09/20 170 lb (77.1 kg)   Body mass index is 27.6 kg/m.   Physical Exam    Constitutional: Appears well-developed and well-nourished. No distress.  HENT:  Head: Normocephalic and atraumatic.  Neck: Neck supple. No tracheal deviation present. No thyromegaly present.  No cervical lymphadenopathy Cardiovascular: Normal rate, regular rhythm and normal heart sounds.  No murmur heard. No  carotid bruit .  No edema Pulmonary/Chest: Effort normal and breath sounds normal. No respiratory distress. No has no wheezes. No rales.  Skin: Skin is warm and dry. Not diaphoretic.  Psychiatric: Normal mood and affect. Behavior is normal.      Assessment & Plan:    See Problem List for Assessment and Plan of chronic medical problems.    This visit occurred during the SARS-CoV-2 public health emergency.  Safety protocols were in place, including screening questions prior to the visit,  additional usage of staff PPE, and extensive cleaning of exam room while observing appropriate contact time as indicated for disinfecting solutions.

## 2020-04-26 NOTE — Patient Instructions (Addendum)
°  Blood work was ordered.       Medications changes include :   None     Please followup in 6 months   

## 2020-04-27 ENCOUNTER — Encounter: Payer: Self-pay | Admitting: Internal Medicine

## 2020-04-27 ENCOUNTER — Ambulatory Visit (INDEPENDENT_AMBULATORY_CARE_PROVIDER_SITE_OTHER): Payer: BC Managed Care – PPO | Admitting: Internal Medicine

## 2020-04-27 ENCOUNTER — Other Ambulatory Visit: Payer: Self-pay

## 2020-04-27 VITALS — BP 130/78 | HR 73 | Temp 98.0°F | Ht 66.0 in | Wt 171.0 lb

## 2020-04-27 DIAGNOSIS — Z794 Long term (current) use of insulin: Secondary | ICD-10-CM

## 2020-04-27 DIAGNOSIS — I10 Essential (primary) hypertension: Secondary | ICD-10-CM | POA: Diagnosis not present

## 2020-04-27 DIAGNOSIS — I6930 Unspecified sequelae of cerebral infarction: Secondary | ICD-10-CM | POA: Diagnosis not present

## 2020-04-27 DIAGNOSIS — E1151 Type 2 diabetes mellitus with diabetic peripheral angiopathy without gangrene: Secondary | ICD-10-CM

## 2020-04-27 DIAGNOSIS — J4520 Mild intermittent asthma, uncomplicated: Secondary | ICD-10-CM

## 2020-04-27 DIAGNOSIS — R350 Frequency of micturition: Secondary | ICD-10-CM | POA: Diagnosis not present

## 2020-04-27 DIAGNOSIS — I700 Atherosclerosis of aorta: Secondary | ICD-10-CM | POA: Diagnosis not present

## 2020-04-27 LAB — URINALYSIS, ROUTINE W REFLEX MICROSCOPIC
Bilirubin Urine: NEGATIVE
Ketones, ur: NEGATIVE
Leukocytes,Ua: NEGATIVE
Nitrite: NEGATIVE
Specific Gravity, Urine: 1.02 (ref 1.000–1.030)
Urine Glucose: >=1000 — AB
Urobilinogen, UA: 0.2 (ref 0.0–1.0)
pH: 5.5 (ref 5.0–8.0)

## 2020-04-27 LAB — LIPID PANEL
Cholesterol: 166 mg/dL (ref 0–200)
HDL: 70.1 mg/dL (ref >39.00)
LDL Cholesterol: 59 mg/dL (ref 0–99)
NonHDL: 95.46
Total CHOL/HDL Ratio: 2
Triglycerides: 184 mg/dL — ABNORMAL HIGH (ref 0.0–149.0)
VLDL: 36.8 mg/dL (ref 0.0–40.0)

## 2020-04-27 LAB — COMPREHENSIVE METABOLIC PANEL
ALT: 22 U/L (ref 0–35)
AST: 15 U/L (ref 0–37)
Albumin: 4 g/dL (ref 3.5–5.2)
Alkaline Phosphatase: 83 U/L (ref 39–117)
BUN: 13 mg/dL (ref 6–23)
CO2: 31 mEq/L (ref 19–32)
Calcium: 9.6 mg/dL (ref 8.4–10.5)
Chloride: 104 mEq/L (ref 96–112)
Creatinine, Ser: 0.81 mg/dL (ref 0.40–1.20)
GFR: 77.1 mL/min (ref >60.00)
Glucose, Bld: 272 mg/dL — ABNORMAL HIGH (ref 70–99)
Potassium: 4 mEq/L (ref 3.5–5.1)
Sodium: 140 mEq/L (ref 135–145)
Total Bilirubin: 0.5 mg/dL (ref 0.2–1.2)
Total Protein: 6.9 g/dL (ref 6.0–8.3)

## 2020-04-27 LAB — HEMOGLOBIN A1C: Hgb A1c MFr Bld: 8 % — ABNORMAL HIGH (ref 4.6–6.5)

## 2020-04-27 MED ORDER — BLOOD GLUCOSE MONITOR KIT: PACK | 0 refills | Status: DC

## 2020-04-27 NOTE — Assessment & Plan Note (Signed)
Chronic Blood pressure fairly controlled, but ideally it should be tighter controlled given her history of stroke Advised that she really needs to take the hydralazine 3 times a day consistently along with her other medications She will continue to monitor her blood pressure-no changes today Continue amlodipine 5 mg daily, hydralazine 25 mg 3 times daily, losartan 100 mg daily and metoprolol XL 100 mg daily

## 2020-04-27 NOTE — Assessment & Plan Note (Signed)
Chronic She needs a new glucometer so she can continue to monitor at home-prescription given Sugars controlled last we will recheck them Continue Farxiga 5 mg daily, Januvia 100 mg daily She is having some mild UTI-like symptoms we may need to consider discontinuing the [place] since she has had an infection not long ago Continue regular exercise Eye exams up-to-date

## 2020-04-27 NOTE — Assessment & Plan Note (Signed)
Chronic Check lipid panel-LDL has been at goal of less than 70 Continue Crestor 10 mg daily Continue regular exercise and heart healthy diet

## 2020-04-27 NOTE — Assessment & Plan Note (Signed)
Chronic Controlled Occasional wheeze or chest tightness-maybe 1-2 times a month Uses albuterol as needed-continue

## 2020-04-28 LAB — URINE CULTURE

## 2020-04-29 MED ORDER — RYBELSUS 3 MG PO TABS: 3.0000 mg | ORAL_TABLET | Freq: Every day | ORAL | 0 refills | Status: DC

## 2020-04-29 NOTE — Addendum Note (Signed)
Addended by: Binnie Rail on: 04/29/2020 07:32 AM   Modules accepted: Orders

## 2020-05-02 ENCOUNTER — Other Ambulatory Visit: Payer: Self-pay

## 2020-05-02 MED ORDER — RYBELSUS 3 MG PO TABS: 3.0000 mg | ORAL_TABLET | Freq: Every day | ORAL | 0 refills | Status: DC

## 2020-05-06 ENCOUNTER — Other Ambulatory Visit: Payer: Self-pay

## 2020-05-06 ENCOUNTER — Telehealth: Payer: Self-pay | Admitting: Internal Medicine

## 2020-05-06 DIAGNOSIS — R000 Tachycardia, unspecified: Secondary | ICD-10-CM

## 2020-05-06 DIAGNOSIS — I10 Essential (primary) hypertension: Secondary | ICD-10-CM

## 2020-05-06 MED ORDER — METOPROLOL SUCCINATE ER 100 MG PO TB24: ORAL_TABLET | ORAL | 1 refills | Status: DC

## 2020-05-06 NOTE — Telephone Encounter (Signed)
Sent in today for patient. 

## 2020-05-06 NOTE — Telephone Encounter (Signed)
   Pharmacy states they need order for metoprolol succinate (TOPROL-XL) 100 MG 24 hr tablet resent, they accidentally deleted request

## 2020-05-23 ENCOUNTER — Other Ambulatory Visit: Payer: Self-pay | Admitting: Internal Medicine

## 2020-05-24 ENCOUNTER — Other Ambulatory Visit: Payer: Self-pay | Admitting: Internal Medicine

## 2020-05-27 ENCOUNTER — Other Ambulatory Visit: Payer: BC Managed Care – PPO

## 2020-05-27 ENCOUNTER — Other Ambulatory Visit: Payer: Self-pay

## 2020-05-27 DIAGNOSIS — Z20822 Contact with and (suspected) exposure to covid-19: Secondary | ICD-10-CM

## 2020-05-30 ENCOUNTER — Encounter: Payer: Self-pay | Admitting: Obstetrics and Gynecology

## 2020-05-31 ENCOUNTER — Ambulatory Visit: Payer: Self-pay | Admitting: Obstetrics and Gynecology

## 2020-05-31 LAB — NOVEL CORONAVIRUS, NAA: SARS-CoV-2, NAA: NOT DETECTED

## 2020-06-08 ENCOUNTER — Ambulatory Visit: Payer: Self-pay | Admitting: Obstetrics and Gynecology

## 2020-06-14 ENCOUNTER — Ambulatory Visit: Payer: Self-pay | Admitting: Obstetrics and Gynecology

## 2020-06-30 ENCOUNTER — Telehealth: Payer: Self-pay | Admitting: Internal Medicine

## 2020-06-30 ENCOUNTER — Other Ambulatory Visit: Payer: Self-pay

## 2020-06-30 DIAGNOSIS — E1151 Type 2 diabetes mellitus with diabetic peripheral angiopathy without gangrene: Secondary | ICD-10-CM

## 2020-06-30 MED ORDER — FREESTYLE LIBRE READER DEVI: 0 refills | Status: DC

## 2020-06-30 MED ORDER — FREESTYLE LIBRE SENSOR SYSTEM MISC: 11 refills | Status: DC

## 2020-06-30 NOTE — Telephone Encounter (Signed)
Freestyle sent to [facility]

## 2020-06-30 NOTE — Telephone Encounter (Signed)
Patient called and said that both of the devices are covered under her insurance. She said that the insurance company gave her information for a vendor  for the [place]. She said that the Freestyle is covered at her pharmacy.   Vendor for Dexcom: [employer] [id number].

## 2020-06-30 NOTE — Telephone Encounter (Signed)
Patient interested in Dexcom or Freestyle CGM. Advised patient to check with insurance to see what device they cover and let office know.

## 2020-07-01 ENCOUNTER — Telehealth: Payer: Self-pay | Admitting: Internal Medicine

## 2020-07-02 ENCOUNTER — Other Ambulatory Visit: Payer: Self-pay | Admitting: Internal Medicine

## 2020-07-04 NOTE — Telephone Encounter (Signed)
Continuous Blood Gluc Sensor (FREESTYLE LIBRE 14 DAY SENSOR) MISC        Changed from: Continuous Blood Gluc Sensor ([place]) MISC   Sig: USE AS DIRECTED TO MONITOR SUGARS.   Disp:  Not specified (Pharmacy requested: 28 kit)  Refills:  11   Start: 07/01/2020   Class: Normal   Non-formulary   Last ordered: 4 days ago by Binnie Rail, MD Last refill: 07/01/2020   Rx #: [id number]   Pharmacy comment: Script Clarification:IS PT SUPPOSED TO CHANGE EVERY 2 WEEKS?      Continuous Blood Gluc Receiver (FREESTYLE LIBRE 51 DAY READER) DEVI       Changed from: Continuous Blood Gluc Receiver (FREESTYLE LIBRE READER) DEVI   Sig: USE AS DIRECTED TO MONITOR SUGARS. DX E11.51   Disp:  1 each  Refills:  0   Start: 07/01/2020   Class: Normal   Non-formulary   Last ordered: 4 days ago by Binnie Rail, MD Last refill: 07/01/2020   Rx #: [id number]   Pharmacy comment: Script Clarification:14 DAY READER?     [facility] be [facility] at[facility] [place], [place] [address]

## 2020-07-06 ENCOUNTER — Other Ambulatory Visit: Payer: Self-pay

## 2020-07-06 NOTE — Telephone Encounter (Signed)
   Patient calling to request Freestyle Libre 2 The style that was sent is not part of the free trial

## 2020-07-07 NOTE — Telephone Encounter (Signed)
Patient called and said that it needed to be the FreeStyle [place] 2. Please advise

## 2020-07-08 ENCOUNTER — Other Ambulatory Visit: Payer: Self-pay | Admitting: Family

## 2020-07-08 MED ORDER — FREESTYLE LIBRE 2 READER DEVI: 1 refills | Status: DC

## 2020-07-08 NOTE — Telephone Encounter (Signed)
Calling again to check on the status of the FreeStyle [place] 2

## 2020-07-08 NOTE — Telephone Encounter (Signed)
[facility] and said that [facility] have not [facility] the prescription for Continuous Blood Gluc Sensor (FREESTYLE LIBRE 2 Sensor ) MISC. They are requesting it be sent [facility] to [facility] [place], [place]

## 2020-07-11 ENCOUNTER — Other Ambulatory Visit: Payer: Self-pay

## 2020-07-11 MED ORDER — FREESTYLE LIBRE 14 DAY SENSOR MISC: 3 refills | Status: DC

## 2020-07-11 MED ORDER — FREESTYLE LIBRE 14 DAY READER DEVI: 0 refills | Status: DC

## 2020-07-11 MED ORDER — FREESTYLE LIBRE 2 READER DEVI: 1 refills | Status: DC

## 2020-07-11 NOTE — Telephone Encounter (Signed)
Mickel Baas previously sent in script last week so not sure why pharmacy did not receive.  I re-faxed scripts again today.

## 2020-07-18 ENCOUNTER — Other Ambulatory Visit: Payer: Self-pay | Admitting: Internal Medicine

## 2020-07-31 NOTE — Patient Instructions (Signed)
  Blood work was ordered.     Medications changes include :     Your prescription(s) have been submitted to your pharmacy. Please take as directed and contact our office if you believe you are having problem(s) with the medication(s).   A referral was ordered for        Someone from their office will call you to schedule an appointment.    Please followup in 6 months  

## 2020-07-31 NOTE — Progress Notes (Signed)
Subjective:    Patient ID: Rebekah Kennedy, female    DOB: 1955/11/29, 65 y.o.   MRN: [id number]  HPI The patient is here for follow up of their chronic medical problems, including htn, DM, hyperlipidemia, h/o CVA, asthma, obesity       Medications and allergies reviewed with patient and updated if appropriate.  Patient Active Problem List   Diagnosis Date Noted  . Vulvar itching 02/09/2020  . Dermatitis of eyelid, contact or allergic 07/31/2018  . History of CVA with residual deficit, R sided weakness 07/14/2018  . Left thalamic infarction ([place]) 07/12/2018  . Carotid artery disease ([place]) 07/09/2018  . Numbness and tingling 12/17/2017  . Right foot drop 12/17/2017  . Atherosclerosis of aorta ([place]) 10/09/2017  . Bariatric surgery status, 07/2017 08/19/2017  . Constipation 08/19/2017  . Greater trochanteric bursitis of both hips 12/19/2016  . OSA (obstructive sleep apnea) 07/26/2016  . Diabetes ([place]) 07/19/2015  . History of colonic polyps 12/01/2014  . Partial nontraumatic tear of right rotator cuff 09/21/2013  . Goiter 12/10/2011  . Asthma 12/10/2011  . 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 History of TIA (transient ischemic attack) 02/13/2011  . PROTEINURIA, MILD 09/15/2009  . Hyperlipidemia 12/01/2006  . Uncontrolled hypertension 07/07/2006    Current Outpatient Medications on File Prior to Visit  Medication Sig Dispense Refill  . albuterol (PROVENTIL HFA;VENTOLIN HFA) 108 (90 Base) MCG/ACT inhaler Inhale 2 puffs into the lungs every 4 (four) hours as needed for wheezing or shortness of breath. 18 g 2  . amLODipine (NORVASC) 5 MG tablet Take 1 tablet (5 mg total) by mouth daily. 90 tablet 1  . aspirin EC 81 MG EC tablet Take 1 tablet (81 mg total) by mouth daily. 30 tablet 0  . Biotin 5000 MCG TABS Take 10,000 mcg by mouth daily.     . blood glucose meter kit and supplies KIT Dispense based on patient and insurance preference. Use up to four times daily as  directed. (FOR E11.9). 1 each 0  . calcium-vitamin D (OSCAL WITH D) 500-200 MG-UNIT tablet Take 1 tablet by mouth.    . Cholecalciferol (VITAMIN D3 PO) Take 2,000 Units by mouth daily.    . clopidogrel (PLAVIX) 75 MG tablet TAKE 1 TABLET BY MOUTH EVERY DAY 90 tablet 1  . Continuous Blood Gluc Receiver (FREESTYLE LIBRE 14 DAY READER) DEVI USE AS DIRECTED TO MONITOR SUGARS. DX E11.51 1 each 0  . Continuous Blood Gluc Receiver (FREESTYLE LIBRE 2 READER) DEVI Use as directed to check blood sugar 1 each 1  . Continuous Blood Gluc Sensor (FREESTYLE LIBRE 14 DAY SENSOR) MISC Use as directed every 2 weeks to monitor blood sugars as instructed 28 each 3  . FARXIGA 5 MG TABS tablet TAKE 1 TABLET BY MOUTH EVERY DAY 90 tablet 0  . hydrALAZINE (APRESOLINE) 25 MG tablet Take 1 tablet (25 mg total) by mouth 3 (three) times daily. 270 tablet 1  . losartan (COZAAR) 100 MG tablet TAKE 1 TAB BY MOUTH ONCE DAILY. 90 tablet 1  . metoprolol succinate (TOPROL-XL) 100 MG 24 hr tablet TAKE 1 TABLET BY MOUTH DAILY WITH OR IMMEDIATELY FOLLOWING A MEAL 90 tablet 1  . montelukast (SINGULAIR) 10 MG tablet TAKE 1 TABLET BY MOUTH EVERY DAY 90 tablet 1  . ONETOUCH VERIO test strip TEST FOUR TIMES A DAY AS DIRECTED 100 strip 1  . OVER THE COUNTER MEDICATION Take 1 tablet by mouth daily. BariMelt    .  rosuvastatin (CRESTOR) 10 MG tablet TAKE 1 TABLET BY MOUTH EVERY DAY 90 tablet 3  . RYBELSUS 3 MG TABS TAKE 1 TABLET BY MOUTH DAILY BEFORE BREAKFAST. 30 tablet 0  . tacrolimus (PROTOPIC) 0.1 % ointment APPLY TO AFFECTED AREA TWICE A DAY (Patient taking differently: daily as needed. ) 30 g 0   No current facility-administered medications on file prior to visit.    Past Medical History:  Diagnosis Date  . ASTHMA NOS W/ACUTE EXACERBATION 07/10/2010  . Cataract 2010   surgery  . Cervical dysplasia   . DEPRESSION 03/16/2007  . DIABETES MELLITUS, TYPE II 12/01/2006   dx 2004  . Dysmenorrhea   . Fibroid   . HYPERLIPIDEMIA  12/01/2006  . HYPERTENSION 07/07/2006  . Neuropathy   . Obesity   . Osteopenia 05/2017   T score -1.5 FRAX 2.6% / 0.1%  . Retinal edema    gets steriod inj in the eyes    . Sleep apnea    HAS CPAP BUT ADMITS DOES NOT USE JUDICIALLY   . Splenomegaly    in college  . Stroke (cerebrum) ([place]) 2020   left side pt states  . Stroke [facility]) 2012   R pontine, mild residual left hemiparesis  . TIA (transient ischemic attack) 2012   2 TIAs 1 week apart of each other     Past Surgical History:  Procedure Laterality Date  . 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 APPENDECTOMY    . BIOPSY THYROID  05/02/11   Nonneoplastic goiter  . BREAST BIOPSY    . BREAST LUMPECTOMY WITH RADIOACTIVE SEED LOCALIZATION Left 07/25/2017   Procedure: LEFT BREAST LUMPECTOMY WITH RADIOACTIVE SEED LOCALIZATION;  Surgeon: Coralie Keens, MD;  Location: [place];  Service: General;  Laterality: Left;  . BREAST SURGERY     Reduction  . COLONOSCOPY    . COLPOSCOPY    . DILATION AND CURETTAGE OF UTERUS  1975   DUB  . EYE SURGERY     Laser  . GASTRIC ROUX-EN-Y N/A 08/12/2017   Procedure: LAPAROSCOPIC ROUX-EN-Y GASTRIC BYPASS WITH HIATAL HERNIA REPAIR AND UPPER ENDOSCOPY;  Surgeon: [name], [name], MD;  [facility];  Service: General;  Laterality: N/A;  . GYNECOLOGIC CRYOSURGERY    . MYOMECTOMY    . OVARIAN CYST REMOVAL    . PELVIC LAPAROSCOPY  75,88   DL lysis of adhesions  . STOMACH SURGERY      Social History   Socioeconomic History  . Marital status: Single    Spouse name: Not on file  . Number of children: 2  . Years of education: 16  . Highest education level: Bachelor's degree (e.g., BA, AB, BS)  Occupational History  . Occupation: Product manager: [employer]  Tobacco Use  . Smoking status: Never Smoker  . Smokeless tobacco: Never Used  Vaping Use  . Vaping Use: Never used  Substance and Sexual Activity  . Alcohol use: Yes    Alcohol/week: 0.0 standard drinks    Comment:  very rare  . Drug use: No  . Sexual activity: Not Currently    Birth control/protection: Post-menopausal    Comment: 1st intercourse 65 yo-Fewer than 5 partners  Other Topics Concern  . 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 Cancer Maternal Grandmother        Colon Cancer  . Asthma Maternal Grandmother   . Colon cancer Maternal Grandmother   . Diabetes Father   . Heart disease Father   . Hypertension Father   . Hyperlipidemia Father   . Stroke Father   . Hypertension Mother   . Heart disease Mother   . Ovarian cancer Mother   . Cancer Mother        Lung cancer  . Asthma Mother   . COPD Mother   . Hyperlipidemia Mother   . Diabetes Brother   . Hypertension Brother   . Kidney disease Brother   . Asthma Brother   . Heart disease Brother   . Hyperlipidemia Brother   . Cancer Brother        Prostate  . Colon polyps Brother   . Graves' disease Sister   . Diabetes Sister   . Breast cancer Sister 55  . Graves' disease Paternal Grandmother   . Hypertension Paternal Grandmother   . Heart disease Paternal Grandmother   . Alzheimer's disease Paternal Grandmother   . Cancer Maternal Grandfather   . Heart failure Brother   . Heart failure Brother   . Esophageal cancer Neg Hx   . Rectal cancer Neg Hx   . Stomach cancer Neg Hx     Review of Systems     Objective:  There were no vitals filed for this visit. BP Readings from Last 3 Encounters:  04/27/20 130/78  02/09/20 130/70  09/21/19 (!) 176/90   Wt Readings from  Last 3 Encounters:  04/27/20 171 lb (77.6 kg)  03/24/20 167 lb (75.8 kg)  02/09/20 170 lb (77.1 kg)   There is no height or weight on file to calculate BMI.   Physical Exam    Constitutional: Appears well-developed and well-nourished. No distress.  HENT:  Head: Normocephalic and atraumatic.  Neck: Neck supple. No tracheal deviation present. No thyromegaly present.  No cervical lymphadenopathy Cardiovascular: Normal rate, regular rhythm and normal heart sounds.   No murmur heard. No carotid bruit .  No edema Pulmonary/Chest: Effort normal and breath sounds normal. No respiratory distress. No has no wheezes. No rales.  Skin: Skin is warm and dry. Not diaphoretic.  Psychiatric: Normal mood and affect. Behavior is normal.      Assessment & Plan:    See Problem List for Assessment and Plan of chronic medical problems.    This visit occurred during the SARS-CoV-2 public health emergency.  Safety protocols were in place, including screening questions prior to the visit, additional usage of staff PPE, and extensive cleaning of exam room while observing appropriate contact time as indicated for disinfecting solutions.    This encounter was created in error - please disregard.

## 2020-08-02 ENCOUNTER — Encounter: Payer: Self-pay | Admitting: Internal Medicine

## 2020-08-02 ENCOUNTER — Telehealth: Payer: Self-pay | Admitting: Internal Medicine

## 2020-08-02 DIAGNOSIS — E1151 Type 2 diabetes mellitus with diabetic peripheral angiopathy without gangrene: Secondary | ICD-10-CM

## 2020-08-02 DIAGNOSIS — I10 Essential (primary) hypertension: Secondary | ICD-10-CM

## 2020-08-02 DIAGNOSIS — E7849 Other hyperlipidemia: Secondary | ICD-10-CM

## 2020-08-02 DIAGNOSIS — I6930 Unspecified sequelae of cerebral infarction: Secondary | ICD-10-CM

## 2020-08-02 DIAGNOSIS — J4520 Mild intermittent asthma, uncomplicated: Secondary | ICD-10-CM

## 2020-08-02 NOTE — Telephone Encounter (Signed)
Patient dropped off Disability Claim form.  NOV: 08/05/20.

## 2020-08-04 NOTE — Progress Notes (Signed)
Subjective:    Patient ID: Rebekah Kennedy, female    DOB: 03-12-1956, 65 y.o.   MRN: [id number]  HPI The patient is here for follow up of their chronic medical problems, including htn, DM, hyperlipidemia, h/o CVA, asthma, obesity   She has been as compliant with a diabetic diet at times due to traveling to family funerals.  She has seen as high as 400.    She is having more difficulty with her right hand which is weak from the stroke.   She does PT exercises from her stroke.  She has been more active.  [facility]     Medications and allergies reviewed with patient and updated if appropriate.  Patient Active Problem List   Diagnosis Date Noted  . Dermatitis of eyelid, contact or allergic 07/31/2018  . History of CVA with residual deficit, R sided weakness 07/14/2018  . Left thalamic infarction ([place]) 07/12/2018  . Carotid artery disease ([place]) 07/09/2018  . Numbness and tingling 12/17/2017  . Right foot drop 12/17/2017  . Atherosclerosis of aorta ([place]) 10/09/2017  . Bariatric surgery status, 07/2017 08/19/2017  . Constipation 08/19/2017  . Greater trochanteric bursitis of both hips 12/19/2016  . OSA (obstructive sleep apnea) 07/26/2016  . Diabetes ([place]) 07/19/2015  . History of colonic polyps 12/01/2014  . Partial nontraumatic tear of right rotator cuff 09/21/2013  . Goiter 12/10/2011  . Asthma 12/10/2011  . 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 History of TIA (transient ischemic attack) 02/13/2011  . PROTEINURIA, MILD 09/15/2009  . Hyperlipidemia 12/01/2006  . Uncontrolled hypertension 07/07/2006    Current Outpatient Medications on File Prior to Visit  Medication Sig Dispense Refill  . albuterol (PROVENTIL HFA;VENTOLIN HFA) 108 (90 Base) MCG/ACT inhaler Inhale 2 puffs into the lungs every 4 (four) hours as needed for wheezing or shortness of breath. 18 g 2  . amLODipine (NORVASC) 5 MG tablet Take 1 tablet (5 mg total) by mouth  daily. 90 tablet 1  . aspirin EC 81 MG EC tablet Take 1 tablet (81 mg total) by mouth daily. 30 tablet 0  . Biotin 5000 MCG TABS Take 10,000 mcg by mouth daily.     . blood glucose meter kit and supplies KIT Dispense based on patient and insurance preference. Use up to four times daily as directed. (FOR E11.9). 1 each 0  . calcium-vitamin D (OSCAL WITH D) 500-200 MG-UNIT tablet Take 1 tablet by mouth.    . Cholecalciferol (VITAMIN D3 PO) Take 2,000 Units by mouth daily.    . clopidogrel (PLAVIX) 75 MG tablet TAKE 1 TABLET BY MOUTH EVERY DAY 90 tablet 1  . Continuous Blood Gluc Receiver (FREESTYLE LIBRE 14 DAY READER) DEVI USE AS DIRECTED TO MONITOR SUGARS. DX E11.51 1 each 0  . Continuous Blood Gluc Receiver (FREESTYLE LIBRE 2 READER) DEVI Use as directed to check blood sugar 1 each 1  . Continuous Blood Gluc Sensor (FREESTYLE LIBRE 14 DAY SENSOR) MISC Use as directed every 2 weeks to monitor blood sugars as instructed 28 each 3  . FARXIGA 5 MG TABS tablet TAKE 1 TABLET BY MOUTH EVERY DAY 90 tablet 0  . hydrALAZINE (APRESOLINE) 25 MG tablet Take 1 tablet (25 mg total) by mouth 3 (three) times daily. 270 tablet 1  . losartan (COZAAR) 100 MG tablet TAKE 1 TAB BY MOUTH ONCE DAILY. 90 tablet 1  . metoprolol succinate (TOPROL-XL) 100 MG 24 hr tablet TAKE  1 TABLET BY MOUTH DAILY WITH OR IMMEDIATELY FOLLOWING A MEAL 90 tablet 1  . montelukast (SINGULAIR) 10 MG tablet TAKE 1 TABLET BY MOUTH EVERY DAY 90 tablet 1  . ONETOUCH VERIO test strip TEST FOUR TIMES A DAY AS DIRECTED 100 strip 1  . OVER THE COUNTER MEDICATION Take 1 tablet by mouth daily. BariMelt    . rosuvastatin (CRESTOR) 10 MG tablet TAKE 1 TABLET BY MOUTH EVERY DAY 90 tablet 3  . RYBELSUS 3 MG TABS TAKE 1 TABLET BY MOUTH DAILY BEFORE BREAKFAST. 30 tablet 0  . tacrolimus (PROTOPIC) 0.1 % ointment APPLY TO AFFECTED AREA TWICE A DAY (Patient taking differently: daily as needed.) 30 g 0  . OneTouch Delica Lancets 33G MISC SMARTSIG:1 Each Topical  4 Times Daily     No current facility-administered medications on file prior to visit.    Past Medical History:  Diagnosis Date  . ASTHMA NOS W/ACUTE EXACERBATION 07/10/2010  . Cataract 2010   surgery  . Cervical dysplasia   . DEPRESSION 03/16/2007  . DIABETES MELLITUS, TYPE II 12/01/2006   dx 2004  . Dysmenorrhea   . Fibroid   . HYPERLIPIDEMIA 12/01/2006  . HYPERTENSION 07/07/2006  . Neuropathy   . Obesity   . Osteopenia 05/2017   T score -1.5 FRAX 2.6% / 0.1%  . Retinal edema    gets steriod inj in the eyes    . Sleep apnea    HAS CPAP BUT ADMITS DOES NOT USE JUDICIALLY   . Splenomegaly    in college  . Stroke (cerebrum) ([facility]) 2020   left side pt states  . Stroke [facility]) 2012   R pontine, mild residual left hemiparesis  . TIA (transient ischemic attack) 2012   2 TIAs 1 week apart of each other     Past Surgical History:  Procedure Laterality Date  . 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 APPENDECTOMY    . BIOPSY THYROID  05/02/11   Nonneoplastic goiter  . BREAST BIOPSY    . BREAST LUMPECTOMY WITH RADIOACTIVE SEED LOCALIZATION Left 07/25/2017   Procedure: LEFT BREAST LUMPECTOMY WITH RADIOACTIVE SEED LOCALIZATION;  Surgeon: Abigail Miyamoto, MD;  Location: [facility];  Service: General;  Laterality: Left;  . BREAST SURGERY     Reduction  . COLONOSCOPY    . COLPOSCOPY    . DILATION AND CURETTAGE OF UTERUS  1975   DUB  . EYE SURGERY     Laser  . GASTRIC ROUX-EN-Y N/A 08/12/2017   Procedure: LAPAROSCOPIC ROUX-EN-Y GASTRIC BYPASS WITH HIATAL HERNIA REPAIR AND UPPER ENDOSCOPY;  Surgeon: [name], De Blanch, MD;  Location: [facility];  Service: General;  Laterality: N/A;  . GYNECOLOGIC CRYOSURGERY    . MYOMECTOMY    . OVARIAN CYST REMOVAL    . PELVIC LAPAROSCOPY  75,88   DL lysis of adhesions  . STOMACH SURGERY      Social History   Socioeconomic History  . Marital status: Single    Spouse name: Not on file  . Number of children: 2  . Years of education: 16  . Highest  education level: Bachelor's degree (e.g., BA, AB, BS)  Occupational History  . Occupation: Magazine features editor: [employer]	[place]  Tobacco Use  . Smoking status: Never Smoker  . Smokeless tobacco: Never Used  Vaping Use  . Vaping Use: Never used  Substance and Sexual Activity  . Alcohol use: Yes    Alcohol/week: 0.0 standard drinks    Comment: very  rare  . Drug use: No  . Sexual activity: Not Currently    Birth control/protection: Post-menopausal    Comment: 1st intercourse 65 yo-Fewer than 5 partners  Other Topics Concern  . 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 Cancer Maternal Grandmother        Colon Cancer  . Asthma Maternal Grandmother   . Colon cancer Maternal Grandmother   . Diabetes Father   . Heart disease Father   . Hypertension Father   . Hyperlipidemia Father   . Stroke Father   . Hypertension Mother   . Heart disease Mother   . Ovarian cancer Mother   . Cancer Mother        Lung cancer  . Asthma Mother   . COPD Mother   . Hyperlipidemia Mother   . Diabetes Brother   . Hypertension Brother   . Kidney disease Brother   . Asthma Brother   . Heart disease Brother   . Hyperlipidemia Brother   . Cancer Brother        Prostate  . Colon polyps Brother   . Graves' disease Sister   . Diabetes Sister   . Breast cancer Sister 16  . Graves' disease Paternal Grandmother   . Hypertension Paternal Grandmother   . Heart disease Paternal Grandmother   . Alzheimer's disease Paternal Grandmother   . Cancer  Maternal Grandfather   . Heart failure Brother   . Heart failure Brother   . Esophageal cancer Neg Hx   . Rectal cancer Neg Hx   . Stomach cancer Neg Hx     Review of Systems  Constitutional: Negative for chills and fever.  Respiratory: Negative for cough, shortness of breath and wheezing.   Cardiovascular: Negative for chest pain, palpitations and leg swelling.  Gastrointestinal: Negative for abdominal pain, constipation, diarrhea and nausea.  Neurological: Positive for dizziness (occasional) and weakness (right side from previous stroke). Negative for light-headedness and headaches.       Objective:   Vitals:   08/05/20 0830  BP: 140/74  Pulse: 88  Temp: 98.2 F (36.8 C)  SpO2: 95%   BP Readings from Last 3 Encounters:  08/05/20 140/74  04/27/20 130/78  02/09/20 130/70   Wt Readings from Last 3 Encounters:  08/05/20 168 lb (76.2 kg)  04/27/20 171 lb (77.6 kg)  03/24/20 167 lb (75.8 kg)   Body mass index is 27.12 kg/m.   Physical Exam    Constitutional: Appears well-developed and well-nourished. No distress.  HENT:  Head: Normocephalic and atraumatic.  Neck: Neck supple. No tracheal deviation present. No thyromegaly present.  No cervical lymphadenopathy Cardiovascular: Normal rate, regular rhythm and normal heart sounds.   No murmur heard. No carotid bruit .  No edema Pulmonary/Chest: Effort normal and breath sounds normal. No respiratory distress. No has no wheezes. No rales.  Skin: Skin is warm and dry. Not diaphoretic.  Psychiatric:  Normal mood and affect. Behavior is normal.      Assessment & Plan:    See Problem List for Assessment and Plan of chronic medical problems.    This visit occurred during the SARS-CoV-2 public health emergency.  Safety protocols were in place, including screening questions prior to the visit, additional usage of staff PPE, and extensive cleaning of exam room while observing appropriate contact time as indicated for  disinfecting solutions.

## 2020-08-04 NOTE — Patient Instructions (Addendum)
  Blood work was ordered.     Medications changes include :   Increase rybelsus to 7 mg daily  Your prescription(s) have been submitted to your pharmacy. Please take as directed and contact our office if you believe you are having problem(s) with the medication(s).      Please followup in 3 months

## 2020-08-05 ENCOUNTER — Other Ambulatory Visit: Payer: Self-pay

## 2020-08-05 ENCOUNTER — Ambulatory Visit: Payer: BC Managed Care – PPO | Admitting: Internal Medicine

## 2020-08-05 ENCOUNTER — Encounter: Payer: Self-pay | Admitting: Internal Medicine

## 2020-08-05 VITALS — BP 140/74 | HR 88 | Temp 98.2°F | Ht 66.0 in | Wt 168.0 lb

## 2020-08-05 DIAGNOSIS — I700 Atherosclerosis of aorta: Secondary | ICD-10-CM

## 2020-08-05 DIAGNOSIS — J4520 Mild intermittent asthma, uncomplicated: Secondary | ICD-10-CM

## 2020-08-05 DIAGNOSIS — E7849 Other hyperlipidemia: Secondary | ICD-10-CM

## 2020-08-05 DIAGNOSIS — I10 Essential (primary) hypertension: Secondary | ICD-10-CM | POA: Diagnosis not present

## 2020-08-05 DIAGNOSIS — E1151 Type 2 diabetes mellitus with diabetic peripheral angiopathy without gangrene: Secondary | ICD-10-CM

## 2020-08-05 DIAGNOSIS — I6930 Unspecified sequelae of cerebral infarction: Secondary | ICD-10-CM

## 2020-08-05 LAB — COMPREHENSIVE METABOLIC PANEL
ALT: 27 U/L (ref 0–35)
AST: 16 U/L (ref 0–37)
Albumin: 3.9 g/dL (ref 3.5–5.2)
Alkaline Phosphatase: 82 U/L (ref 39–117)
BUN: 16 mg/dL (ref 6–23)
CO2: 30 mEq/L (ref 19–32)
Calcium: 9.6 mg/dL (ref 8.4–10.5)
Chloride: 105 mEq/L (ref 96–112)
Creatinine, Ser: 0.72 mg/dL (ref 0.40–1.20)
GFR: 88.63 mL/min (ref >60.00)
Glucose, Bld: 282 mg/dL — ABNORMAL HIGH (ref 70–99)
Potassium: 4.2 mEq/L (ref 3.5–5.1)
Sodium: 143 mEq/L (ref 135–145)
Total Bilirubin: 0.6 mg/dL (ref 0.2–1.2)
Total Protein: 6.9 g/dL (ref 6.0–8.3)

## 2020-08-05 LAB — LIPID PANEL
Cholesterol: 161 mg/dL (ref 0–200)
HDL: 72.4 mg/dL (ref >39.00)
LDL Cholesterol: 64 mg/dL (ref 0–99)
NonHDL: 88.7
Total CHOL/HDL Ratio: 2
Triglycerides: 124 mg/dL (ref 0.0–149.0)
VLDL: 24.8 mg/dL (ref 0.0–40.0)

## 2020-08-05 LAB — CBC WITH DIFFERENTIAL/PLATELET
Basophils Absolute: 0.1 10*3/uL (ref 0.0–0.1)
Basophils Relative: 0.9 % (ref 0.0–3.0)
Eosinophils Absolute: 0.3 10*3/uL (ref 0.0–0.7)
Eosinophils Relative: 3.8 % (ref 0.0–5.0)
HCT: 41.4 % (ref 36.0–46.0)
Hemoglobin: 13.5 g/dL (ref 12.0–15.0)
Lymphocytes Relative: 27.6 % (ref 12.0–46.0)
Lymphs Abs: 2 10*3/uL (ref 0.7–4.0)
MCHC: 32.5 g/dL (ref 30.0–36.0)
MCV: 84.7 fl (ref 78.0–100.0)
Monocytes Absolute: 0.5 10*3/uL (ref 0.1–1.0)
Monocytes Relative: 6.2 % (ref 3.0–12.0)
Neutro Abs: 4.5 10*3/uL (ref 1.4–7.7)
Neutrophils Relative %: 61.5 % (ref 43.0–77.0)
Platelets: 266 10*3/uL (ref 150.0–400.0)
RBC: 4.89 Mil/uL (ref 3.87–5.11)
RDW: 13.9 % (ref 11.5–15.5)
WBC: 7.4 10*3/uL (ref 4.0–10.5)

## 2020-08-05 LAB — HEMOGLOBIN A1C: Hgb A1c MFr Bld: 8.6 % — ABNORMAL HIGH (ref 4.6–6.5)

## 2020-08-05 MED ORDER — RYBELSUS 7 MG PO TABS: 7.0000 mg | ORAL_TABLET | Freq: Every day | ORAL | 2 refills | Status: DC

## 2020-08-05 NOTE — Assessment & Plan Note (Signed)
Chronic Check lipid panel  Continue Crestor 10 mg daily Regular exercise and healthy diet encouraged

## 2020-08-05 NOTE — Telephone Encounter (Signed)
Forms have been completed and placed in providers box to review and sign.  °

## 2020-08-05 NOTE — Assessment & Plan Note (Addendum)
Chronic Mild,intermittent Controlled Continue albuterol prn Continue singulair nightly

## 2020-08-05 NOTE — Assessment & Plan Note (Addendum)
Chronic Not ideally controlled  Lab Results  Component Value Date   HGBA1C 8.0 (H) 04/27/2020   Check a1c today  Continue farxiga 5 mg daily, rybelsus increased to 7 mg Encouraged diabetic diet, regular exercise

## 2020-08-05 NOTE — Assessment & Plan Note (Addendum)
Chronic BP okay, but ideally should be lower Stressed the importance of maintaining a healthy diet We will continue current medications-advised to monitor BP at home Continue amlodipine 5 mg qd, hydralazine 25 mg TID, losartan 100 mg qd, metoprolol xl 100 mg qd cmp

## 2020-08-05 NOTE — Assessment & Plan Note (Signed)
Chronic w residual right sided weakness Uses cane to ambulate Discussed importance of good BP and sugar control, healthy diet and regular exercise Continue plavix 75 mg qd, ASA 81 mg qd, crestor 10 mg qd, farxiga 5 mg qd

## 2020-08-08 ENCOUNTER — Other Ambulatory Visit: Payer: Self-pay | Admitting: Internal Medicine

## 2020-08-08 DIAGNOSIS — Z0279 Encounter for issue of other medical certificate: Secondary | ICD-10-CM

## 2020-08-09 ENCOUNTER — Encounter: Payer: Self-pay | Admitting: Internal Medicine

## 2020-08-12 NOTE — Telephone Encounter (Signed)
Forms have been signed, Faxed, Copy sent to scan &Charged for.  Patient informed and original mailed to patient for her records.

## 2020-08-15 NOTE — Telephone Encounter (Signed)
  Per [facility] Please send Freestyle Precision Neo test strips to pharmacy

## 2020-08-17 MED ORDER — FREESTYLE PRECISION NEO TEST VI STRP: ORAL_STRIP | 12 refills | Status: DC

## 2020-08-23 ENCOUNTER — Other Ambulatory Visit: Payer: Self-pay | Admitting: Internal Medicine

## 2020-09-07 ENCOUNTER — Encounter: Payer: Self-pay | Admitting: Internal Medicine

## 2020-09-08 MED ORDER — RYBELSUS 14 MG PO TABS: 14.0000 mg | ORAL_TABLET | Freq: Every day | ORAL | 1 refills | Status: DC

## 2020-09-16 NOTE — Progress Notes (Signed)
65 y.o. G0P0 Single Black or [place] American female here for annual exam.      No LMP recorded. Patient is postmenopausal.     ~approx 10 years ago   Mother had history of Ovarian Cancer, age 38 (caught early but died of lung cancer age 59) also has a cousin that just died of ovarian cancer in her 43's. Her sister was diagnosed with breast cancer.  Reports vulvar irritation/itching x several months. Sometimes uses hydrocortisone.  Dx diabetes 2002. Using insulin since 2005. Hx 2 TIAs in 2012, and left brain stem stoke in 2020. Bariatric surgery 07/2017 lost 90lbs.   Retired [employer], Pharmacist, hospital  Sexually active: No.  The current method of family planning is post menopausal status.    Exercising: Yes.    walking Smoker:  no  Health Maintenance: Pap:  04-11-18 neg HPV HR neg History of abnormal Pap:  Cryo in 1990's MMG:  10-05-2019 category b density birads 2:neg, already scheduled Colonoscopy:  2020 BMD:   2019 f/u 70yr Gardasil:   n/a Covid-19: pfizer Hep C testing: not done Screening Labs: pcp   reports that she has never smoked. She has never used smokeless tobacco. She reports previous alcohol use. She reports that she does not use drugs.  Past Medical History:  Diagnosis Date  . ASTHMA NOS W/ACUTE EXACERBATION 07/10/2010  . Cataract 2010   surgery  . Cervical dysplasia   . DEPRESSION 03/16/2007  . DIABETES MELLITUS, TYPE II 12/01/2006   dx 2004  . Dysmenorrhea   . Fibroid   . HYPERLIPIDEMIA 12/01/2006  . HYPERTENSION 07/07/2006  . Neuropathy   . Obesity   . Osteopenia 05/2017   T score -1.5 FRAX 2.6% / 0.1%  . Retinal edema    gets steriod inj in the eyes    . Sleep apnea    HAS CPAP BUT ADMITS DOES NOT USE JUDICIALLY   . Splenomegaly    in college  . Stroke (cerebrum) ([place] 2020   left side pt states  . Stroke ([facility] 2012   R pontine, mild residual left hemiparesis, had mini 2 strokes 2012 & 1 in 2020  . TIA (transient ischemic attack) 2012   2 TIAs 1 week apart of  each other     Past Surgical History:  Procedure Laterality Date  . 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 APPENDECTOMY    . BIOPSY THYROID  05/02/11   Nonneoplastic goiter  . BREAST BIOPSY    . BREAST LUMPECTOMY WITH RADIOACTIVE SEED LOCALIZATION Left 07/25/2017   Procedure: LEFT BREAST LUMPECTOMY WITH RADIOACTIVE SEED LOCALIZATION;  Surgeon: BCoralie Keens MD;  [facility]  Service: General;  Laterality: Left;  . BREAST SURGERY     Reduction  . COLONOSCOPY    . COLPOSCOPY    . DILATION AND CURETTAGE OF UTERUS  1975   DUB  . EYE SURGERY     Laser  . GASTRIC ROUX-EN-Y N/A 08/12/2017   Procedure: LAPAROSCOPIC ROUX-EN-Y GASTRIC BYPASS WITH HIATAL HERNIA REPAIR AND UPPER ENDOSCOPY;  Surgeon: [name], LArta Bruce MD;  Location[facility];  Service: General;  Laterality: N/A;  . GYNECOLOGIC CRYOSURGERY    . MYOMECTOMY    . OVARIAN CYST REMOVAL    . PELVIC LAPAROSCOPY  75,88   DL lysis of adhesions  . STOMACH SURGERY      Current Outpatient Medications  Medication Sig Dispense Refill  . albuterol (PROVENTIL HFA;VENTOLIN HFA) 108 (90 Base) MCG/ACT inhaler Inhale 2 puffs into the lungs  every 4 (four) hours as needed for wheezing or shortness of breath. 18 g 2  . amLODipine (NORVASC) 5 MG tablet Take 1 tablet (5 mg total) by mouth daily. 90 tablet 1  . aspirin EC 81 MG EC tablet Take 1 tablet (81 mg total) by mouth daily. 30 tablet 0  . Biotin 5000 MCG TABS Take 10,000 mcg by mouth daily.     . blood glucose meter kit and supplies KIT Dispense based on patient and insurance preference. Use up to four times daily as directed. (FOR E11.9). 1 each 0  . calcium-vitamin D (OSCAL WITH D) 500-200 MG-UNIT tablet Take 1 tablet by mouth.    . Cholecalciferol (VITAMIN D3 PO) Take 2,000 Units by mouth daily.    . clopidogrel (PLAVIX) 75 MG tablet TAKE 1 TABLET BY MOUTH EVERY DAY 90 tablet 1  . Continuous Blood Gluc Receiver (FREESTYLE LIBRE 14 DAY READER) DEVI USE AS DIRECTED TO MONITOR SUGARS.  DX E11.51 1 each 0  . Continuous Blood Gluc Receiver (FREESTYLE LIBRE 2 READER) DEVI Use as directed to check blood sugar 1 each 1  . Continuous Blood Gluc Sensor (FREESTYLE LIBRE 14 DAY SENSOR) MISC Use as directed every 2 weeks to monitor blood sugars as instructed 28 each 3  . FARXIGA 5 MG TABS tablet TAKE 1 TABLET BY MOUTH EVERY DAY 90 tablet 0  . glucose blood (FREESTYLE PRECISION NEO TEST) test strip Use as instructed 100 each 12  . hydrALAZINE (APRESOLINE) 25 MG tablet Take 1 tablet (25 mg total) by mouth 3 (three) times daily. 270 tablet 1  . losartan (COZAAR) 100 MG tablet TAKE 1 TAB BY MOUTH ONCE DAILY. 90 tablet 1  . metoprolol succinate (TOPROL-XL) 100 MG 24 hr tablet TAKE 1 TABLET BY MOUTH DAILY WITH OR IMMEDIATELY FOLLOWING A MEAL 90 tablet 1  . montelukast (SINGULAIR) 10 MG tablet TAKE 1 TABLET BY MOUTH EVERY DAY 90 tablet 1  . OneTouch Delica Lancets 45W MISC SMARTSIG:1 Each Topical 4 Times Daily    . OVER THE COUNTER MEDICATION Take 1 tablet by mouth daily. BariMelt    . rosuvastatin (CRESTOR) 10 MG tablet TAKE 1 TABLET BY MOUTH EVERY DAY 90 tablet 3  . Semaglutide (RYBELSUS) 14 MG TABS Take 14 mg by mouth daily before breakfast. 90 tablet 1  . tacrolimus (PROTOPIC) 0.1 % ointment APPLY TO AFFECTED AREA TWICE A DAY (Patient taking differently: daily as needed.) 30 g 0   No current facility-administered medications for this visit.    Family History  Problem Relation Age of Onset  . Cancer Maternal Grandmother        Colon Cancer  . Asthma Maternal Grandmother   . Colon cancer Maternal Grandmother   . Diabetes Father   . Heart disease Father   . Hypertension Father   . Hyperlipidemia Father   . Stroke Father        passed at age 22 from stroke  . Hypertension Mother   . Heart disease Mother   . Ovarian cancer Mother   . Cancer Mother        Lung cancer  . Asthma Mother   . COPD Mother   . Hyperlipidemia Mother   . Diabetes Brother   . Kidney disease Brother   .  Hypertension Brother   . Graves' disease Sister   . Diabetes Sister   . Breast cancer Sister 48  . Graves' disease Paternal Grandmother   . Hypertension Paternal Grandmother   . Heart  disease Paternal Grandmother   . Alzheimer's disease Paternal Grandmother   . Cancer Maternal Grandfather   . Heart failure Brother   . Prostate cancer Brother   . Diabetes Brother   . Asthma Brother   . Hypertension Brother   . Heart failure Brother   . Prostate cancer Brother   . Stroke Brother   . Hypertension Brother     Review of Systems  Constitutional: Negative.   HENT: Negative.   Eyes: Negative.   Respiratory: Negative.   Cardiovascular: Negative.   Gastrointestinal: Negative.   Endocrine: Negative.   Genitourinary:       External vaginal itching  Musculoskeletal: Negative.   Skin: Negative.   Allergic/Immunologic: Negative.   Neurological: Negative.   Hematological: Negative.   Psychiatric/Behavioral: Negative.     Exam:   Ht 5' 4.75" (1.645 m)   Wt 172 lb (78 kg)   BMI 28.84 kg/m   Height: 5' 4.75" (164.5 cm)  General appearance: alert, cooperative and appears stated age, no acute distress Head: Normocephalic, without obvious abnormality Neck: no adenopathy, thyroid normal to inspection and palpation Lungs: clear to auscultation bilaterally Breasts: No axillary or supraclavicular adenopathy, Normal to palpation without dominant masses, hx breast reduction surgery, scars consistent Heart: regular rate and rhythm Abdomen: soft, non-tender; no masses,  no organomegaly. Extensive scaring lower abdomen Extremities: extremities normal, no edema Skin: No rashes or lesions Lymph nodes: Cervical, supraclavicular, and axillary nodes normal. No abnormal inguinal nodes palpated Neurologic: Grossly normal   Pelvic: External genitalia: edema, redness              Urethra:  normal appearing urethra with no masses, tenderness or lesions              Bartholins and Skenes: normal                  Vagina: normal appearing vagina, appropriate for age, small amount watery appearing discharge, no lesions              Cervix: difficult to visualize r/t tolerance of exam, able to palpate (left of mid-line, palpates normal             Bimanual Exam:   Uterus:  normal size, contour, position, consistency, mobility, non-tender, position is slightly left of midline              Adnexa: difficult to assess r/t scar tissue   Rectal: no palpable mass,  hard stool noted  Wet mount: neg yeast (although clinically suggestive), neg trich, neg clue                Joy, CMA Chaperone was present for exam.  A/P:  Well woman exam  Osteopenia, unspecified location - Plan: DG Bone Density  Vulvar irritation - Plan: WET PREP FOR TRICH, YEAST, CLUE, fluconazole (DIFLUCAN) 150 MG tablet, nystatin-triamcinolone ointment (MYCOLOG)  Family history of ovarian cancer - Plan: US PELVIS TRANSVAGINAL NON-OB (TV ONLY) Difficult to assess adnexa r/t scar tissue, uterus palpates left of midline  Pap : due 2024 Mammogram: due this month, scheduled Labs:with PCP Medications: Diflucan, Mycolog

## 2020-09-19 ENCOUNTER — Other Ambulatory Visit: Payer: Self-pay

## 2020-09-19 ENCOUNTER — Ambulatory Visit (INDEPENDENT_AMBULATORY_CARE_PROVIDER_SITE_OTHER): Payer: BC Managed Care – PPO | Admitting: Nurse Practitioner

## 2020-09-19 ENCOUNTER — Encounter: Payer: Self-pay | Admitting: Nurse Practitioner

## 2020-09-19 VITALS — BP 118/80 | HR 82 | Resp 16 | Ht 64.75 in | Wt 172.0 lb

## 2020-09-19 DIAGNOSIS — N9089 Other specified noninflammatory disorders of vulva and perineum: Secondary | ICD-10-CM

## 2020-09-19 DIAGNOSIS — Z01419 Encounter for gynecological examination (general) (routine) without abnormal findings: Secondary | ICD-10-CM | POA: Diagnosis not present

## 2020-09-19 DIAGNOSIS — Z8041 Family history of malignant neoplasm of ovary: Secondary | ICD-10-CM

## 2020-09-19 DIAGNOSIS — M8580 Other specified disorders of bone density and structure, unspecified site: Secondary | ICD-10-CM | POA: Diagnosis not present

## 2020-09-19 LAB — WET PREP FOR TRICH, YEAST, CLUE

## 2020-09-19 MED ORDER — NYSTATIN-TRIAMCINOLONE 100000-0.1 UNIT/GM-% EX OINT: 1.0000 "application " | TOPICAL_OINTMENT | Freq: Two times a day (BID) | CUTANEOUS | 0 refills | Status: DC

## 2020-09-19 MED ORDER — FLUCONAZOLE 150 MG PO TABS: 150.0000 mg | ORAL_TABLET | Freq: Once | ORAL | 1 refills | Status: AC

## 2020-09-19 NOTE — Patient Instructions (Addendum)
Genitourinary Syndrome of Menopause (GSM)  Genitourinary Syndrome of menopause is a term that describes the spectrum of changes caused by the lack of estrogen in menopause. Common Signs and Symptoms Include: Vaginal dryness, irritation/burning/itching. Abnormal discharge, vaginal pressure, pain with sex, decreased sexual arousal/desire, difficulty achieving orgasm, pain with urination, urgency, incontinence of urine, recurrent bladder infections, urethral prolapse and others. Diagnosis can usually be made with history and pelvic exam. Other testing may be considered to rule out other potential abnormalities. Symptoms can be progressive and chronic. The goal of treatment is symptom relief.  There are several different approaches to improving symptoms:  Vaginal Moisturizers and lubricants: Glycerin Moisturizer (Replens), Silicone based products (Uberlube), water-based products (Restore Moisturizing Gel by [employer]), Hyaluronic Acid vaginal products (such as HYALO GYN), and natural oils such as Olive Oil, Almond Oil and Coconut Oil (Since coconut oil comes in solid preparations, you can form them into suppositories and insert into the vagina as needed). These products are Over-the-Counter (OTC) at [employer] and retail stores and DO NOT contain hormones.  How to use vaginal and vulvar moisturizers . Many vaginal moisturizers come with an applicator. You will need fill the applicator with the moisturizer and then insert it carefully into your vagina. You can put lubricant on the tip of the applicator to make it easier to insert into your vagina. . You can also use vaginal moisturizers on your vulvar tissues, including your inner and outer labia (the folds of skin around your vagina). To put these moisturizers on your vulva, put a small amount (pea or grape size) of moisturizer on your finger. Then, massage the moisturizer into your vaginal opening and onto your labia. . If you recently finished  cancer treatment, or are going through sudden menopause, you may need to use the moisturizers 3 to 5 times a week to relieve your symptoms. . Vaginal and vulvar moisturizers should be used before you go to bed, so the product can be fully absorbed.  Lifestyle modifications: smoking cessation, pelvic floor physical therapy, Kegel's exercises, vaginal dilators  Non-hormonal therapies: Lidocaine (topical anesthetic) to decrease sensitivity, Oral Ospemifeme (requires prescription), Laser therapy (Pros and Cons should be discussed with provider)  Hormones Therapies: For moderate to severe GSM, vaginal estrogen is considered the most effective treatment. It can be used in combination with vaginal moisturizers and lubricants. Estrogen is delivered in small doses in the vagina with various preparations available including creams, rings, and tablets. Estrogen therapy may be contraindicated with certain hormone sensitive cancers. Vaginal DHEA and Testosterone have shown effectiveness in some cases to help with relieving vaginal atrophy and have shown mixed results with libido. Risks and benefits should be discussed prior to starting therapy.  Health Maintenance for Postmenopausal Women Menopause is a normal process in which your ability to get pregnant comes to an end. This process happens slowly over many months or years, usually between the ages of 18 and 55. Menopause is complete when you have missed your menstrual periods for 12 months. It is important to talk with your health care provider about some of the most common conditions that affect women after menopause (postmenopausal women). These include heart disease, cancer, and bone loss (osteoporosis). Adopting a healthy lifestyle and getting preventive care can help to promote your health and wellness. The actions you take can also lower your chances of developing some of these common conditions. What should I know about menopause? During menopause, you may  get a number of symptoms, such as:  Hot flashes. These can be moderate or severe.  Night sweats.  Decrease in sex drive.  Mood swings.  Headaches.  Tiredness.  Irritability.  Memory problems.  Insomnia. Choosing to treat or not to treat these symptoms is a decision that you make with your health care provider. Do I need hormone replacement therapy?  Hormone replacement therapy is effective in treating symptoms that are caused by menopause, such as hot flashes and night sweats.  Hormone replacement carries certain risks, especially as you become older. If you are thinking about using estrogen or estrogen with progestin, discuss the benefits and risks with your health care provider. What is my risk for heart disease and stroke? The risk of heart disease, heart attack, and stroke increases as you age. One of the causes may be a change in the body's hormones during menopause. This can affect how your body uses dietary fats, triglycerides, and cholesterol. Heart attack and stroke are medical emergencies. There are many things that you can do to help prevent heart disease and stroke. Watch your blood pressure  High blood pressure causes heart disease and increases the risk of stroke. This is more likely to develop in people who have high blood pressure readings, are of African descent, or are overweight.  Have your blood pressure checked: ? Every 3-5 years if you are 58-47 years of age. ? Every year if you are 93 years old or older. Eat a healthy diet  Eat a diet that includes plenty of vegetables, fruits, low-fat dairy products, and lean protein.  Do not eat a lot of foods that are high in solid fats, added sugars, or sodium.   Get regular exercise Get regular exercise. This is one of the most important things you can do for your health. Most adults should:  Try to exercise for at least 150 minutes each week. The exercise should increase your heart rate and make you sweat  (moderate-intensity exercise).  Try to do strengthening exercises at least twice each week. Do these in addition to the moderate-intensity exercise.  Spend less time sitting. Even light physical activity can be beneficial. Other tips  Work with your health care provider to achieve or maintain a healthy weight.  Do not use any products that contain nicotine or tobacco, such as cigarettes, e-cigarettes, and chewing tobacco. If you need help quitting, ask your health care provider.  Know your numbers. Ask your health care provider to check your cholesterol and your blood sugar (glucose). Continue to have your blood tested as directed by your health care provider. Do I need screening for cancer? Depending on your health history and family history, you may need to have cancer screening at different stages of your life. This may include screening for:  Breast cancer.  Cervical cancer.  Lung cancer.  Colorectal cancer. What is my risk for osteoporosis? After menopause, you may be at increased risk for osteoporosis. Osteoporosis is a condition in which bone destruction happens more quickly than new bone creation. To help prevent osteoporosis or the bone fractures that can happen because of osteoporosis, you may take the following actions:  If you are 32-22 years old, get at least 1,000 mg of calcium and at least 600 mg of vitamin D per day.  If you are older than age 67 but younger than age 33, get at least 1,200 mg of calcium and at least 600 mg of vitamin D per day.  If you are older than age 80, get at least 68,200  mg of calcium and at least 800 mg of vitamin D per day. Smoking and drinking excessive alcohol increase the risk of osteoporosis. Eat foods that are rich in calcium and vitamin D, and do weight-bearing exercises several times each week as directed by your health care provider. How does menopause affect my mental health? Depression may occur at any age, but it is more common as  you become older. Common symptoms of depression include:  Low or sad mood.  Changes in sleep patterns.  Changes in appetite or eating patterns.  Feeling an overall lack of motivation or enjoyment of activities that you previously enjoyed.  Frequent crying spells. Talk with your health care provider if you think that you are experiencing depression. General instructions See your health care provider for regular wellness exams and vaccines. This may include:  Scheduling regular health, dental, and eye exams.  Getting and maintaining your vaccines. These include: ? Influenza vaccine. Get this vaccine each year before the flu season begins. ? Pneumonia vaccine. ? Shingles vaccine. ? Tetanus, diphtheria, and pertussis (Tdap) booster vaccine. Your health care provider may also recommend other immunizations. Tell your health care provider if you have ever been abused or do not feel safe at home. Summary  Menopause is a normal process in which your ability to get pregnant comes to an end.  This condition causes hot flashes, night sweats, decreased interest in sex, mood swings, headaches, or lack of sleep.  Treatment for this condition may include hormone replacement therapy.  Take actions to keep yourself healthy, including exercising regularly, eating a healthy diet, watching your weight, and checking your blood pressure and blood sugar levels.  Get screened for cancer and depression. Make sure that you are up to date with all your vaccines. This information is not intended to replace advice given to you by your health care provider. Make sure you discuss any questions you have with your health care provider. Document Revised: 04/30/2018 Document Reviewed: 04/30/2018 Elsevier Patient Education  2021 [employer].

## 2020-10-04 LAB — HM MAMMOGRAPHY

## 2020-10-06 ENCOUNTER — Encounter: Payer: Self-pay | Admitting: Internal Medicine

## 2020-10-13 ENCOUNTER — Ambulatory Visit: Payer: BC Managed Care – PPO | Admitting: Obstetrics & Gynecology

## 2020-10-13 ENCOUNTER — Other Ambulatory Visit: Payer: Self-pay

## 2020-10-13 ENCOUNTER — Ambulatory Visit (INDEPENDENT_AMBULATORY_CARE_PROVIDER_SITE_OTHER): Payer: BC Managed Care – PPO

## 2020-10-13 ENCOUNTER — Encounter: Payer: Self-pay | Admitting: Obstetrics & Gynecology

## 2020-10-13 DIAGNOSIS — N840 Polyp of corpus uteri: Secondary | ICD-10-CM

## 2020-10-13 DIAGNOSIS — Z8041 Family history of malignant neoplasm of ovary: Secondary | ICD-10-CM

## 2020-10-13 DIAGNOSIS — R9389 Abnormal findings on diagnostic imaging of other specified body structures: Secondary | ICD-10-CM | POA: Diagnosis not present

## 2020-10-13 NOTE — Progress Notes (Signed)
    Rebekah Kennedy [birth year] [id number]        65 y.o.  G0P0   RP: Fam H/O Ovarian Ca for Pelvic US  HPI:  Mother with Ovarian Cancer Dxed at age 64.  Sister with Breast Ca.  Patient has no pelvic pain.  Mentions very light PMB recently.  Screening mammo 09/2020 Neg.  H/O TIA x 2 and a Stroke.  DM type 2 on Insulin.   OB History  Gravida Para Term Preterm AB Living  0            SAB IAB Ectopic Multiple Live Births               Past medical history,surgical history, problem list, medications, allergies, family history and social history were all reviewed and documented in the EPIC chart.   Directed ROS with pertinent positives and negatives documented in the history of present illness/assessment and plan.  Exam:  There were no vitals filed for this visit. General appearance:  Normal  Pelvic US today: T/V images.  Retroverted uterus normal in size and shape with multiple small calcified fibroids the largest of which is measured at 1.7 x 1.4 cm.  The uterus is measured at 6.13 x 4.59 x 3.97 cm.  The endometrial lining is thickened with small cystic spaces compatible with an endometrial polyp measuring 1 x 0.5 cm.  No feeder vessel is identified.  The endometrial thickness is measured at 4.67 mm.  Both ovaries are normal.  No adnexal mass seen.  No free fluid in the posterior cul-de-sac.   Assessment/Plan:  65 y.o. G0  1. Family history of ovarian cancer Pelvic US findings reviewed thoroughly with patient.  Bilateral ovaries normal.  No FF.  Patient reassured.  2. Endometrial polyp Endometrial Polyp 1.0 x 0.5 cm on Pelvic US.  Patient informed, counseling done.  Will schedule a HSC/Myosure Excision/D+C.  F/U Preop.  [place] pamphlet given.  H/O TIAs and Stroke.  Medical clearance to obtain.   [name] MD, 4:35 PM 10/13/2020

## 2020-10-18 ENCOUNTER — Ambulatory Visit (INDEPENDENT_AMBULATORY_CARE_PROVIDER_SITE_OTHER): Payer: BC Managed Care – PPO

## 2020-10-18 ENCOUNTER — Other Ambulatory Visit: Payer: Self-pay | Admitting: Nurse Practitioner

## 2020-10-18 ENCOUNTER — Other Ambulatory Visit: Payer: Self-pay

## 2020-10-18 DIAGNOSIS — M8589 Other specified disorders of bone density and structure, multiple sites: Secondary | ICD-10-CM | POA: Diagnosis not present

## 2020-10-18 DIAGNOSIS — Z780 Asymptomatic menopausal state: Secondary | ICD-10-CM | POA: Diagnosis not present

## 2020-10-18 DIAGNOSIS — Z13820 Encounter for screening for osteoporosis: Secondary | ICD-10-CM | POA: Diagnosis not present

## 2020-10-18 DIAGNOSIS — M8580 Other specified disorders of bone density and structure, unspecified site: Secondary | ICD-10-CM

## 2020-10-18 NOTE — Progress Notes (Signed)
Subjective:    Patient ID: Rebekah Kennedy, female    DOB: 1955-05-24, 65 y.o.   MRN: [id number]  HPI The patient is here for an acute visit.   Right Top of foot injury-   It occurred a few days ago.  She dropped a can onto her right foot.  It hurt a lot initially.  It has improved.  It is a little swollen and red.  The pain is gone now, unless she touches the area.  Walking does not cause pain.  The skin is not broken.  No fever.  The foot is somewhat numb and tingles - but this is chronic and unchanged.   Sugars not less than 160 fasting.  She is taking her medication as prescribed.  She will be changing insurance in the middle of the summer and switching to Medicare and her current medications may not be covered.  Medications and allergies reviewed with patient and updated if appropriate.  Patient Active Problem List   Diagnosis Date Noted  . Dermatitis of eyelid, contact or allergic 07/31/2018  . History of CVA with residual deficit, R sided weakness 07/14/2018  . Left thalamic infarction ([place]) 07/12/2018  . Carotid artery disease ([place]) 07/09/2018  . Numbness and tingling 12/17/2017  . Right foot drop 12/17/2017  . Atherosclerosis of aorta ([place]) 10/09/2017  . Bariatric surgery status, 07/2017 08/19/2017  . Constipation 08/19/2017  . Greater trochanteric bursitis of both hips 12/19/2016  . OSA (obstructive sleep apnea) 07/26/2016  . Diabetes ([place]) 07/19/2015  . History of colonic polyps 12/01/2014  . Partial nontraumatic tear of right rotator cuff 09/21/2013  . Goiter 12/10/2011  . Asthma 12/10/2011  . 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 History of TIA (transient ischemic attack) 02/13/2011  . PROTEINURIA, MILD 09/15/2009  . Hyperlipidemia 12/01/2006  . Uncontrolled hypertension 07/07/2006    Current Outpatient Medications on File Prior to Visit  Medication Sig Dispense Refill  . albuterol (PROVENTIL HFA;VENTOLIN HFA) 108 (90 Base) MCG/ACT inhaler Inhale 2 puffs  into the lungs every 4 (four) hours as needed for wheezing or shortness of breath. 18 g 2  . amLODipine (NORVASC) 5 MG tablet Take 1 tablet (5 mg total) by mouth daily. 90 tablet 1  . aspirin EC 81 MG EC tablet Take 1 tablet (81 mg total) by mouth daily. 30 tablet 0  . Biotin 5000 MCG TABS Take 10,000 mcg by mouth daily.     . blood glucose meter kit and supplies KIT Dispense based on patient and insurance preference. Use up to four times daily as directed. (FOR E11.9). 1 each 0  . calcium-vitamin D (OSCAL WITH D) 500-200 MG-UNIT tablet Take 1 tablet by mouth.    . Cholecalciferol (VITAMIN D3 PO) Take 2,000 Units by mouth daily.    . clopidogrel (PLAVIX) 75 MG tablet TAKE 1 TABLET BY MOUTH EVERY DAY 90 tablet 1  . Continuous Blood Gluc Receiver (FREESTYLE LIBRE 14 DAY READER) DEVI USE AS DIRECTED TO MONITOR SUGARS. DX E11.51 1 each 0  . Continuous Blood Gluc Receiver (FREESTYLE LIBRE 2 READER) DEVI Use as directed to check blood sugar 1 each 1  . Continuous Blood Gluc Sensor (FREESTYLE LIBRE 14 DAY SENSOR) MISC Use as directed every 2 weeks to monitor blood sugars as instructed 28 each 3  . glucose blood (FREESTYLE PRECISION NEO TEST) test strip Use as instructed 100 each 12  . hydrALAZINE (APRESOLINE) 25 MG tablet Take 1 tablet (25 mg total) by  mouth 3 (three) times daily. 270 tablet 1  . losartan (COZAAR) 100 MG tablet TAKE 1 TAB BY MOUTH ONCE DAILY. 90 tablet 1  . metoprolol succinate (TOPROL-XL) 100 MG 24 hr tablet TAKE 1 TABLET BY MOUTH DAILY WITH OR IMMEDIATELY FOLLOWING A MEAL 90 tablet 1  . montelukast (SINGULAIR) 10 MG tablet TAKE 1 TABLET BY MOUTH EVERY DAY 90 tablet 1  . nystatin-triamcinolone ointment (MYCOLOG) Apply 1 application topically 2 (two) times daily. 30 g 0  . OneTouch Delica Lancets 26R MISC SMARTSIG:1 Each Topical 4 Times Daily    . OVER THE COUNTER MEDICATION Take 1 tablet by mouth daily. BariMelt    . rosuvastatin (CRESTOR) 10 MG tablet TAKE 1 TABLET BY MOUTH EVERY DAY  90 tablet 3  . Semaglutide (RYBELSUS) 14 MG TABS Take 14 mg by mouth daily before breakfast. 90 tablet 1  . tacrolimus (PROTOPIC) 0.1 % ointment APPLY TO AFFECTED AREA TWICE A DAY (Patient taking differently: daily as needed.) 30 g 0   No current facility-administered medications on file prior to visit.    Past Medical History:  Diagnosis Date  . ASTHMA NOS W/ACUTE EXACERBATION 07/10/2010  . Cataract 2010   surgery  . Cervical dysplasia   . DEPRESSION 03/16/2007  . DIABETES MELLITUS, TYPE II 12/01/2006   dx 2004  . Dysmenorrhea   . Fibroid   . HYPERLIPIDEMIA 12/01/2006  . HYPERTENSION 07/07/2006  . Neuropathy   . Obesity   . Osteopenia 05/2017   T score -1.5 FRAX 2.6% / 0.1%  . Retinal edema    gets steriod inj in the eyes    . Sleep apnea    HAS CPAP BUT ADMITS DOES NOT USE JUDICIALLY   . Splenomegaly    in college  . Stroke (cerebrum) ([place]) 2020   left side pt states  . Stroke [facility]) 2012   R pontine, mild residual left hemiparesis, had mini 2 strokes 2012 & 1 in 2020  . TIA (transient ischemic attack) 2012   2 TIAs 1 week apart of each other     Past Surgical History:  Procedure Laterality Date  . 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 APPENDECTOMY    . BIOPSY THYROID  05/02/11   Nonneoplastic goiter  . BREAST BIOPSY    . BREAST LUMPECTOMY WITH RADIOACTIVE SEED LOCALIZATION Left 07/25/2017   Procedure: LEFT BREAST LUMPECTOMY WITH RADIOACTIVE SEED LOCALIZATION;  Surgeon: Coralie Keens, MD;  Location: [facility];  Service: General;  Laterality: Left;  . BREAST SURGERY     Reduction  . COLONOSCOPY    . COLPOSCOPY    . DILATION AND CURETTAGE OF UTERUS  1975   DUB  . EYE SURGERY     Laser  . GASTRIC ROUX-EN-Y N/A 08/12/2017   Procedure: LAPAROSCOPIC ROUX-EN-Y GASTRIC BYPASS WITH HIATAL HERNIA REPAIR AND UPPER ENDOSCOPY;  Surgeon: [name], [name], MD;  Location: [facility];  Service: General;  Laterality: N/A;  . GYNECOLOGIC CRYOSURGERY    . MYOMECTOMY    . OVARIAN CYST  REMOVAL    . PELVIC LAPAROSCOPY  75,88   DL lysis of adhesions  . STOMACH SURGERY      Social History   Socioeconomic History  . Marital status: Single    Spouse name: Not on file  . Number of children: 2  . Years of education: 16  . Highest education level: Bachelor's degree (e.g., BA, AB, BS)  Occupational History  . Occupation: Product manager: [employer]  Tobacco Use  . Smoking status: Never Smoker  . Smokeless tobacco: Never Used  Vaping Use  . Vaping Use: Never used  Substance and Sexual Activity  . Alcohol use: Not Currently    Alcohol/week: 0.0 standard drinks  . Drug use: Never  . Sexual activity: Not Currently    Birth control/protection: Post-menopausal  Other Topics Concern  . 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 Cancer Maternal Grandmother        Colon Cancer  . Asthma Maternal Grandmother   . Colon cancer Maternal Grandmother   . Diabetes Father   . Heart disease Father   . Hypertension Father   . Hyperlipidemia Father   . Stroke Father        passed at age 66 from stroke  . Hypertension Mother   . Heart disease Mother   . Ovarian cancer Mother   . Cancer Mother        Lung cancer  . Asthma Mother   . COPD Mother   . Hyperlipidemia Mother   . Diabetes Brother   . Kidney disease Brother   . Hypertension Brother   . Graves' disease Sister   . Diabetes Sister   . Breast cancer Sister 16  . Graves' disease Paternal Grandmother   . Hypertension Paternal Grandmother   . Heart  disease Paternal Grandmother   . Alzheimer's disease Paternal Grandmother   . Cancer Maternal Grandfather   . Heart failure Brother   . Prostate cancer Brother   . Diabetes Brother   . Asthma Brother   . Hypertension Brother   . Heart failure Brother   . Prostate cancer Brother   . Stroke Brother   . Hypertension Brother     Review of Systems Per HPI    Objective:   Vitals:   10/19/20 1011  BP: (!) 142/80  Pulse: 87  Temp: 98.2 F (36.8 C)  SpO2: 95%   BP Readings from Last 3 Encounters:  10/19/20 (!) 142/80  09/19/20 118/80  08/05/20 140/74   Wt Readings from Last 3 Encounters:  10/19/20 167 lb (75.8 kg)  09/19/20 172 lb (78 kg)  08/05/20 168 lb (76.2 kg)   Body mass index is 28.01 kg/m.   Physical Exam Constitutional:      General: She is not in acute distress.    Appearance: Normal appearance. She is not ill-appearing.  HENT:     Head: Normocephalic and atraumatic.  Musculoskeletal:     Comments: Mild swelling dorsal aspect of right foot that is mild, mild erythema overlying first metatarsal near MTP joint.  Area across swelling is tender to palpation.  No toe pain or swelling, no plantar surface pain, no MTP joint pain, no ankle pain.  No break in skin  Skin:    General: Skin is warm and dry.  Neurological:     Mental Status: She is alert.  Sensory: Sensory deficit (Decreased sensation right foot-chronic) present.            Assessment & Plan:    See Problem List for Assessment and Plan of chronic medical problems.    This visit occurred during the SARS-CoV-2 public health emergency.  Safety protocols were in place, including screening questions prior to the visit, additional usage of staff PPE, and extensive cleaning of exam room while observing appropriate contact time as indicated for disinfecting solutions.

## 2020-10-19 ENCOUNTER — Ambulatory Visit: Payer: BC Managed Care – PPO | Admitting: Internal Medicine

## 2020-10-19 ENCOUNTER — Ambulatory Visit (INDEPENDENT_AMBULATORY_CARE_PROVIDER_SITE_OTHER): Payer: BC Managed Care – PPO

## 2020-10-19 ENCOUNTER — Encounter: Payer: Self-pay | Admitting: Internal Medicine

## 2020-10-19 VITALS — BP 142/80 | HR 87 | Temp 98.2°F | Ht 64.75 in | Wt 167.0 lb

## 2020-10-19 DIAGNOSIS — E1151 Type 2 diabetes mellitus with diabetic peripheral angiopathy without gangrene: Secondary | ICD-10-CM

## 2020-10-19 DIAGNOSIS — M79671 Pain in right foot: Secondary | ICD-10-CM | POA: Diagnosis not present

## 2020-10-19 MED ORDER — DAPAGLIFLOZIN PROPANEDIOL 10 MG PO TABS: 10.0000 mg | ORAL_TABLET | Freq: Every day | ORAL | 1 refills | Status: DC

## 2020-10-19 NOTE — Patient Instructions (Addendum)
  An xray was ordered - have that done downstairs.    Medications changes include :   Increase farxiga to 10 mg daily    A referral was ordered for podiatry       Someone from their office will call you to schedule an appointment.

## 2020-10-19 NOTE — Assessment & Plan Note (Signed)
Chronic Sugars not ideally controlled at home-more than 160 fasting Will increase Farxiga to 10 mg daily Continue Rybelsus 14 mg daily Unfortunately she will be changing insurance in July and will be going onto Medicare and these medications may not be covered.  We will need to look into patient assistance and other options She will follow-up later this month

## 2020-10-19 NOTE — Assessment & Plan Note (Signed)
Acute She dropped a can on her right foot a few days ago-has some swelling and erythema that has improved, pain has improved Unlikely fracture, but will get an x-ray to rule this out No infection-she did not break the skin so low risk for infection She will monitor closely

## 2020-10-20 ENCOUNTER — Telehealth: Payer: Self-pay | Admitting: *Deleted

## 2020-10-20 NOTE — Telephone Encounter (Signed)
-----   Message from [name], MD sent at 10/13/2020  4:51 PM EDT ----- Regarding: Schedule surgery Obtain medical clearance re h/o TIA x 2 and Stroke.  Surgery: Hysteroscopy, myosure excision of Polyp, D+C  Diagnosis: Endometrial Polyp  Location: [facility]  Status: Outpatient  Time: 30 Minutes  Assistant: N/A  Urgency: First Available  Pre-Op Appointment: To Be Scheduled  Post-Op Appointment(s): 2 Weeks  Time Out Of Work: Day Of Surgery, 1 Day Post Op

## 2020-10-20 NOTE — Telephone Encounter (Signed)
Left message to call Florina Glas, RN at [facility], [phone].  

## 2020-10-25 ENCOUNTER — Telehealth: Payer: Self-pay | Admitting: Internal Medicine

## 2020-10-25 NOTE — Telephone Encounter (Signed)
Spoke with patient. Reviewed surgery dates. Patient aware medical clearance needed. RN will contact PCP, confirmed Dr. Quay Burow. Patient aware she may be contacted by PCP for additional f/u. Advised patient I will f/u once surgery date/time confirmed.    Call placed to Dr. Quay Burow office, [phone].  Spoke with [place], will submit request for medical clearance. Will be notified by fax.    Routing to [employer]

## 2020-10-25 NOTE — Telephone Encounter (Signed)
Rebekah Kennedy from [facility] called   Type of form requesting: Medical Clearance  Surgery: Hysteroscopy, myosure excision of Polyp, D+C  Sent by: [place]  Form/Letter should be Faxed to: [phone] ATTN: Rebekah Kennedy   Form should be mailed to:  N/A  Is patient requesting call for pickup: N  Scheduled for: 12/02/2020

## 2020-10-26 ENCOUNTER — Ambulatory Visit (INDEPENDENT_AMBULATORY_CARE_PROVIDER_SITE_OTHER): Payer: BC Managed Care – PPO | Admitting: Podiatry

## 2020-10-26 ENCOUNTER — Encounter: Payer: Self-pay | Admitting: Podiatry

## 2020-10-26 ENCOUNTER — Other Ambulatory Visit: Payer: Self-pay

## 2020-10-26 DIAGNOSIS — B351 Tinea unguium: Secondary | ICD-10-CM

## 2020-10-26 DIAGNOSIS — L600 Ingrowing nail: Secondary | ICD-10-CM

## 2020-10-26 NOTE — Telephone Encounter (Signed)
Call to patient. Per DPR, OK to leave message on voicemail.   Left voicemail requesting a return call to review benefits for Hysteroscopy, myosure excision of Polyp, D+C

## 2020-10-26 NOTE — Telephone Encounter (Signed)
Call received from [facility] at Dr. Quay Burow office. Was advised message received, Dr. Quay Burow will review and fax letter with clearance/recommendations once reviewed.

## 2020-10-26 NOTE — Progress Notes (Signed)
Subjective:   Patient ID: Rebekah Kennedy, female   DOB: 65 y.o.   MRN: [id number]   HPI Patient presents concerned about thickness of her nailbeds and whether or not she may have fungus and whether anything needs to be done.  States that it does not hurt currently but her family physician was concerned also about this and patient does not smoke and is active   Review of Systems  All other systems reviewed and are negative.       Objective:  Physical Exam Vitals and nursing note reviewed.  Constitutional:      Appearance: She is well-developed.  Pulmonary:     Effort: Pulmonary effort is normal.  Musculoskeletal:        General: Normal range of motion.  Skin:    General: Skin is warm.  Neurological:     Mental Status: She is alert.   Neurovascular status found to be intact muscle strength was found to be adequate with patient found to have moderate thickness and discoloration of the hallux nailbeds bilateral with slight incurvation of the nailbeds with pressure.  Patient is found to have good digital perfusion well oriented x3     Assessment:  Localized trauma to the hallux nail bed with mild mycotic infection which also may be present H&P     Plan:  The education rendered discussed reasons we would want to treat this either from a medical laser or removal basis and at this time just continue conservative treatment

## 2020-10-28 NOTE — Telephone Encounter (Signed)
Letter written

## 2020-10-31 NOTE — Telephone Encounter (Signed)
Faxed today and fax conformation received.

## 2020-10-31 NOTE — Telephone Encounter (Signed)
Dr. Dellis Filbert -please review medical clearance letter from Dr. Quay Burow in [place]. Please advise on Plavix.

## 2020-10-31 NOTE — Telephone Encounter (Signed)
Spoke with patient regarding benefits for scheduled  [place]. Patient acknowledges understanding of information presented. Sent to [employer] RN

## 2020-11-02 NOTE — Telephone Encounter (Signed)
Left message to call Sonya Pucci, RN at [facility], [phone].  

## 2020-11-02 NOTE — Telephone Encounter (Signed)
Spoke with patient. Surgery date request confirmed.  Advised surgery is scheduled for 12/02/20 [facility] 0845. Surgery instruction sheet and hospital brochure reviewed, printed copy will be mailed.  Patient advised if Covid requirements and agreeable.  Advised per Dr. Dellis Filbert regarding Plavix.  Pre-op scheduled for 11/16/20.  Routing to provider. Encounter closed.   Cc: KimAlexis

## 2020-11-07 ENCOUNTER — Ambulatory Visit: Payer: BC Managed Care – PPO | Admitting: Internal Medicine

## 2020-11-08 ENCOUNTER — Encounter: Payer: Self-pay | Admitting: Internal Medicine

## 2020-11-08 NOTE — Progress Notes (Signed)
Subjective:    Patient ID: Rebekah Kennedy, female    DOB: 07/13/1955, 65 y.o.   MRN: [id number]  HPI The patient is here for follow up of their chronic medical problems, including htn, DM, hld, h/o CVA, asthma, obesity  She is taking all of her medications as prescribed.   She is exercising a little.      Fasting sugar 146, 152  Medications and allergies reviewed with patient and updated if appropriate.  Patient Active Problem List   Diagnosis Date Noted   Right foot pain 10/19/2020   Dermatitis of eyelid, contact or allergic 07/31/2018   History of CVA with residual deficit, R sided weakness 07/14/2018   Left thalamic infarction ([place]) 07/12/2018   Carotid artery disease ([place]) 07/09/2018   Numbness and tingling 12/17/2017   Right foot drop 12/17/2017   Atherosclerosis of aorta ([place]) 10/09/2017   Bariatric surgery status, 07/2017 08/19/2017   Constipation 08/19/2017   Greater trochanteric bursitis of both hips 12/19/2016   OSA (obstructive sleep apnea) 07/26/2016   Diabetes ([place]) 07/19/2015   History of colonic polyps 12/01/2014   Partial nontraumatic tear of right rotator cuff 09/21/2013   Goiter 12/10/2011   Asthma 12/10/2011   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   History of TIA (transient ischemic attack) 02/13/2011   PROTEINURIA, MILD 09/15/2009   Hyperlipidemia 12/01/2006   Uncontrolled hypertension 07/07/2006    Current Outpatient Medications on File Prior to Visit  Medication Sig Dispense Refill   albuterol (PROVENTIL HFA;VENTOLIN HFA) 108 (90 Base) MCG/ACT inhaler Inhale 2 puffs into the lungs every 4 (four) hours as needed for wheezing or shortness of breath. 18 g 2   amLODipine (NORVASC) 5 MG tablet Take 1 tablet (5 mg total) by mouth daily. 90 tablet 1   aspirin EC 81 MG EC tablet Take 1 tablet (81 mg total) by mouth daily. 30 tablet 0   Biotin 5000 MCG TABS Take 10,000 mcg by mouth daily.      blood glucose meter kit and supplies KIT Dispense based  on patient and insurance preference. Use up to four times daily as directed. (FOR E11.9). 1 each 0   calcium-vitamin D (OSCAL WITH D) 500-200 MG-UNIT tablet Take 1 tablet by mouth.     Cholecalciferol (VITAMIN D3 PO) Take 2,000 Units by mouth daily.     clopidogrel (PLAVIX) 75 MG tablet TAKE 1 TABLET BY MOUTH EVERY DAY 90 tablet 1   Continuous Blood Gluc Receiver (FREESTYLE LIBRE 14 DAY READER) DEVI USE AS DIRECTED TO MONITOR SUGARS. DX E11.51 1 each 0   Continuous Blood Gluc Receiver (FREESTYLE LIBRE 2 READER) DEVI Use as directed to check blood sugar 1 each 1   Continuous Blood Gluc Sensor (FREESTYLE LIBRE 14 DAY SENSOR) MISC Use as directed every 2 weeks to monitor blood sugars as instructed 28 each 3   dapagliflozin propanediol (FARXIGA) 10 MG TABS tablet Take 1 tablet (10 mg total) by mouth daily before breakfast. 90 tablet 1   glucose blood (FREESTYLE PRECISION NEO TEST) test strip Use as instructed 100 each 12   hydrALAZINE (APRESOLINE) 25 MG tablet Take 1 tablet (25 mg total) by mouth 3 (three) times daily. 270 tablet 1   losartan (COZAAR) 100 MG tablet TAKE 1 TAB BY MOUTH ONCE DAILY. 90 tablet 1   metoprolol succinate (TOPROL-XL) 100 MG 24 hr tablet TAKE 1 TABLET BY MOUTH DAILY WITH OR IMMEDIATELY FOLLOWING A MEAL 90 tablet 1   montelukast (SINGULAIR) 10 MG  tablet TAKE 1 TABLET BY MOUTH EVERY DAY 90 tablet 1   nystatin-triamcinolone ointment (MYCOLOG) Apply 1 application topically 2 (two) times daily. 30 g 0   OneTouch Delica Lancets 60A MISC SMARTSIG:1 Each Topical 4 Times Daily     OVER THE COUNTER MEDICATION Take 1 tablet by mouth daily. BariMelt     rosuvastatin (CRESTOR) 10 MG tablet TAKE 1 TABLET BY MOUTH EVERY DAY 90 tablet 3   Semaglutide (RYBELSUS) 14 MG TABS Take 14 mg by mouth daily before breakfast. 90 tablet 1   No current facility-administered medications on file prior to visit.    Past Medical History:  Diagnosis Date   ASTHMA NOS W/ACUTE EXACERBATION 07/10/2010    Cataract 2010   surgery   Cervical dysplasia    DEPRESSION 03/16/2007   DIABETES MELLITUS, TYPE II 12/01/2006   dx 2004   Dysmenorrhea    Fibroid    HYPERLIPIDEMIA 12/01/2006   HYPERTENSION 07/07/2006   Neuropathy    Obesity    Osteopenia 05/2017   T score -1.5 FRAX 2.6% / 0.1%   Retinal edema    gets steriod inj in the eyes     Sleep apnea    HAS CPAP BUT ADMITS DOES NOT USE JUDICIALLY    Splenomegaly    in college   Stroke (cerebrum) ([place]) 2020   left side pt states   Stroke ([place]) 2012   R pontine, mild residual left hemiparesis, had mini 2 strokes 2012 & 1 in 2020   TIA (transient ischemic attack) 2012   2 TIAs 1 week apart of each other     Past Surgical History:  Procedure Laterality Date   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   APPENDECTOMY     BIOPSY THYROID  05/02/11   Nonneoplastic goiter   BREAST BIOPSY     BREAST LUMPECTOMY WITH RADIOACTIVE SEED LOCALIZATION Left 07/25/2017   Procedure: LEFT BREAST LUMPECTOMY WITH RADIOACTIVE SEED LOCALIZATION;  Surgeon: Coralie Keens, MD;  Location: [facility];  Service: General;  Laterality: Left;   BREAST SURGERY     Reduction   COLONOSCOPY     COLPOSCOPY     DILATION AND CURETTAGE OF UTERUS  1975   DUB   EYE SURGERY     Laser   GASTRIC ROUX-EN-Y N/A 08/12/2017   Procedure: LAPAROSCOPIC ROUX-EN-Y GASTRIC BYPASS WITH HIATAL HERNIA REPAIR AND UPPER ENDOSCOPY;  Surgeon: Kieth Brightly, [name], MD;  Location: [facility];  Service: General;  Laterality: N/A;   GYNECOLOGIC CRYOSURGERY     MYOMECTOMY     OVARIAN CYST REMOVAL     PELVIC LAPAROSCOPY  75,88   DL lysis of adhesions   STOMACH SURGERY      Social History   Socioeconomic History   Marital status: Single    Spouse name: Not on file   Number of children: 2   Years of education: 16   Highest education level: Bachelor's degree (e.g., BA, AB, BS)  Occupational History   Occupation: TEACHER    Employer: [employer]  Tobacco Use   Smoking status: Never    Smokeless tobacco: Never  Vaping Use   Vaping Use: Never used  Substance and Sexual Activity   Alcohol use: Not Currently    Alcohol/week: 0.0 standard drinks   Drug use: Never   Sexual activity: Not Currently    Birth control/protection: Post-menopausal  Other Topics Concern   Not on file  Social History Narrative   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   Living with daughter      Right handed      4 steps inside of the home      Social Determinants of Health   Financial Resource Strain: Not on file  Food Insecurity: Not on file  Transportation Needs: Not on file  Physical Activity: Not on file  Stress: Not on file  Social Connections: Not on file    Family History  Problem Relation Age of Onset   Cancer Maternal Grandmother        Colon Cancer   Asthma Maternal Grandmother    Colon cancer Maternal Grandmother    Diabetes Father    Heart disease Father    Hypertension Father    Hyperlipidemia Father    Stroke Father        passed at age 108 from stroke   Hypertension Mother    Heart disease Mother    Ovarian cancer Mother    Cancer Mother        Lung cancer   Asthma Mother    COPD Mother    Hyperlipidemia Mother    Diabetes Brother    Kidney disease Brother    Hypertension Brother    Berenice Primas' disease Sister    Diabetes Sister    Breast cancer Sister 67   Graves' disease Paternal Grandmother    Hypertension Paternal Grandmother    Heart disease Paternal Grandmother    Alzheimer's disease Paternal Grandmother    Cancer Maternal Grandfather    Heart failure Brother    Prostate cancer Brother    Diabetes Brother    Asthma Brother    Hypertension Brother    Heart failure Brother    Prostate cancer Brother    Stroke Brother    Hypertension Brother     Review of Systems  Constitutional:  Negative for fever.  Respiratory:  Negative for cough, shortness of breath and wheezing.   Cardiovascular:   Negative for chest pain, palpitations and leg swelling.  Neurological:  Negative for light-headedness and headaches.      Objective:   Vitals:   11/09/20 0906  BP: 140/78  Pulse: 68  Temp: 98.2 F (36.8 C)  SpO2: 98%   BP Readings from Last 3 Encounters:  11/09/20 140/78  10/19/20 (!) 142/80  09/19/20 118/80   Wt Readings from Last 3 Encounters:  11/09/20 168 lb (76.2 kg)  10/19/20 167 lb (75.8 kg)  09/19/20 172 lb (78 kg)   Body mass index is 28.84 kg/m.   Physical Exam    Constitutional: Appears well-developed and well-nourished. No distress.  HENT:  Head: Normocephalic and atraumatic.  Neck: Neck supple. No tracheal deviation present. No thyromegaly present.  No cervical lymphadenopathy Cardiovascular: Normal rate, regular rhythm and normal heart sounds.   No murmur heard. No carotid bruit .  No edema Pulmonary/Chest: Effort normal and breath sounds normal. No respiratory distress. No has no wheezes. No rales.  Skin: Skin is warm and dry. Not diaphoretic.  Psychiatric: Normal mood and affect. Behavior is normal.      Assessment & Plan:    See Problem List for Assessment and Plan of chronic medical problems.    This visit occurred during the SARS-CoV-2 public health emergency.  Safety protocols were in place, including screening questions prior to the visit, additional usage of staff PPE, and extensive cleaning of exam room while observing appropriate contact time as indicated for disinfecting solutions.

## 2020-11-08 NOTE — Patient Instructions (Addendum)
  Blood work was ordered.     Medications changes include :  none   Your prescription(s) have been submitted to your pharmacy. Please take as directed and contact our office if you believe you are having problem(s) with the medication(s).   A referral was ordered for        Someone from their office will call you to schedule an appointment.    Please followup in 6 months

## 2020-11-09 ENCOUNTER — Other Ambulatory Visit: Payer: Self-pay

## 2020-11-09 ENCOUNTER — Ambulatory Visit: Payer: BC Managed Care – PPO | Admitting: Internal Medicine

## 2020-11-09 VITALS — BP 140/78 | HR 68 | Temp 98.2°F | Ht 64.0 in | Wt 168.0 lb

## 2020-11-09 DIAGNOSIS — I10 Essential (primary) hypertension: Secondary | ICD-10-CM

## 2020-11-09 DIAGNOSIS — E1151 Type 2 diabetes mellitus with diabetic peripheral angiopathy without gangrene: Secondary | ICD-10-CM

## 2020-11-09 LAB — BASIC METABOLIC PANEL
BUN: 15 mg/dL (ref 6–23)
CO2: 29 mEq/L (ref 19–32)
Calcium: 9.6 mg/dL (ref 8.4–10.5)
Chloride: 105 mEq/L (ref 96–112)
Creatinine, Ser: 0.71 mg/dL (ref 0.40–1.20)
GFR: 89.97 mL/min (ref >60.00)
Glucose, Bld: 151 mg/dL — ABNORMAL HIGH (ref 70–99)
Potassium: 3.8 mEq/L (ref 3.5–5.1)
Sodium: 141 mEq/L (ref 135–145)

## 2020-11-09 LAB — HEMOGLOBIN A1C: Hgb A1c MFr Bld: 8.2 % — ABNORMAL HIGH (ref 4.6–6.5)

## 2020-11-09 MED ORDER — TACROLIMUS 0.1 % EX OINT: TOPICAL_OINTMENT | Freq: Two times a day (BID) | CUTANEOUS | 2 refills | Status: AC

## 2020-11-09 MED ORDER — AMMONIUM LACTATE 12 % EX LOTN: 1.0000 "application " | TOPICAL_LOTION | CUTANEOUS | 5 refills | Status: DC | PRN

## 2020-11-09 NOTE — Assessment & Plan Note (Signed)
Chronic Continue Rybelsus 14 mg daily and Farxiga 10 mg daily Sugars seem reasonably controlled at home Check A1c Will adjust medication if necessary Stressed regular exercise, diabetic diet

## 2020-11-09 NOTE — Assessment & Plan Note (Signed)
Chronic Blood pressure adequately controlled Continue amlodipine 5 mg daily, hydralazine 25 mg 3 times daily, losartan 100 mg daily, metoprolol XL 100 mg daily BMP

## 2020-11-11 ENCOUNTER — Ambulatory Visit: Payer: Self-pay | Admitting: Obstetrics & Gynecology

## 2020-11-12 MED ORDER — GLYBURIDE 5 MG PO TABS: 5.0000 mg | ORAL_TABLET | Freq: Every day | ORAL | 5 refills | Status: DC

## 2020-11-12 NOTE — Addendum Note (Signed)
Addended by: Binnie Rail on: 11/12/2020 05:05 PM   Modules accepted: Orders

## 2020-11-13 ENCOUNTER — Encounter: Payer: Self-pay | Admitting: Internal Medicine

## 2020-11-14 ENCOUNTER — Other Ambulatory Visit: Payer: Self-pay | Admitting: Internal Medicine

## 2020-11-14 ENCOUNTER — Other Ambulatory Visit: Payer: Self-pay | Admitting: Nurse Practitioner

## 2020-11-14 DIAGNOSIS — N9089 Other specified noninflammatory disorders of vulva and perineum: Secondary | ICD-10-CM

## 2020-11-15 ENCOUNTER — Other Ambulatory Visit: Payer: Self-pay | Admitting: Internal Medicine

## 2020-11-16 ENCOUNTER — Encounter: Payer: Self-pay | Admitting: Obstetrics & Gynecology

## 2020-11-16 ENCOUNTER — Other Ambulatory Visit: Payer: Self-pay

## 2020-11-16 ENCOUNTER — Ambulatory Visit: Payer: BC Managed Care – PPO | Admitting: Obstetrics & Gynecology

## 2020-11-16 VITALS — BP 126/80

## 2020-11-16 DIAGNOSIS — N840 Polyp of corpus uteri: Secondary | ICD-10-CM

## 2020-11-16 NOTE — Progress Notes (Signed)
    Rebekah Kennedy Aug 04, 1955 [id number]        65 y.o.  G0  RP: Preop HSC Myosure excision/D+C on 12/02/2020  HPI: No change x last visit 10/13/2020.  Had very light PMB, no current bleeding.  No pelvic pain.  Pelvic US showed a 1 cm endometrial lesion c/w a Polyp.  Medical clearance obtained.     OB History  Gravida Para Term Preterm AB Living  0            SAB IAB Ectopic Multiple Live Births               Past medical history,surgical history, problem list, medications, allergies, family history and social history were all reviewed and documented in the EPIC chart.   Directed ROS with pertinent positives and negatives documented in the history of present illness/assessment and plan.  Exam:  Vitals:   11/16/20 1024  BP: 126/80   General appearance:  Normal  Pelvic US 10/13/2020:   T/V images.  Retroverted uterus normal in size and shape with multiple small calcified fibroids the largest of which is measured at 1.7 x 1.4 cm.  The uterus is measured at 6.13 x 4.59 x 3.97 cm.  The endometrial lining is thickened with small cystic spaces compatible with an endometrial polyp measuring 1 x 0.5 cm.  No feeder vessel is identified.  The endometrial thickness is measured at 4.67 mm.  Both ovaries are normal.  No adnexal mass seen.  No free fluid in the posterior cul-de-sac.   Assessment/Plan:  65 y.o. G0P0   1. Endometrial polyp  Light postmenopausal bleeding with pelvic ultrasound revealing a probable endometrial polyp.  Family history of ovarian cancer with bilateral ovaries, no adnexal mass and no free fluid in the pelvis.  Decision to proceed with hysteroscopy, MyoSure excision of endometrial lesion and D&C.  Preop preparation, surgery with risks and benefits as well as postop management and precautions thoroughly reviewed with patient.  Medical clearance obtained.  Recommend for patient to take her heart and blood pressure medicine on the morning of surgery with a very small sip of  water.  Recommend patient to continue on Plavix as the risks would be higher than the benefits to stop it for this kind of low hemorrhage risk procedure.  Patient voiced understanding and agreement with plan.                        Patient was counseled as to the risk of surgery to include the following:  1. Infection (prohylactic antibiotics will be administered)  2. DVT/Pulmonary Embolism (prophylactic pneumo compression stockings will be used)  3.Trauma to internal organs requiring additional surgical procedure to repair any injury to internal organs requiring perhaps additional hospitalization days.  4.Hemmorhage requiring transfusion and blood products which carry risks such as anaphylactic reaction, hepatitis and AIDS  Patient had received literature information on the procedure scheduled and all her questions were answered and fully accepts all risk.    [name] MD, 11:03 AM 11/16/2020

## 2020-11-17 MED ORDER — DAPAGLIFLOZIN PROPANEDIOL 5 MG PO TABS
5.0000 mg | ORAL_TABLET | Freq: Every day | ORAL | 1 refills | Status: DC
Start: 2020-11-17 — End: 2021-08-10

## 2020-11-17 NOTE — Addendum Note (Signed)
Addended by: Binnie Rail on: 11/17/2020 11:57 AM   Modules accepted: Orders

## 2020-11-25 ENCOUNTER — Other Ambulatory Visit: Payer: Self-pay | Admitting: Internal Medicine

## 2020-11-26 ENCOUNTER — Other Ambulatory Visit: Payer: Self-pay | Admitting: Internal Medicine

## 2020-11-29 ENCOUNTER — Encounter (HOSPITAL_BASED_OUTPATIENT_CLINIC_OR_DEPARTMENT_OTHER): Payer: Self-pay | Admitting: Obstetrics & Gynecology

## 2020-11-29 ENCOUNTER — Other Ambulatory Visit: Payer: Self-pay

## 2020-11-29 DIAGNOSIS — Z9989 Dependence on other enabling machines and devices: Secondary | ICD-10-CM

## 2020-11-29 HISTORY — DX: Dependence on other enabling machines and devices: Z99.89

## 2020-11-29 NOTE — Progress Notes (Addendum)
Spoke w/ via phone for pre-op interview---pt Lab needs dos----    ekg per anesthesia guidelines , cbc (dr Dellis Filbert)           Lab results------bmet 11-09-2020 epic COVID test -----patient states asymptomatic no test needed Arrive at -------340 am 12-02-2020 NPO after MN NO Solid Food.  Clear liquids from MN until---545 am then npo Med rec completed Medications to take morning of surgery -----plavix per dr Dellis Filbert instructions ( see 11-16-2020 epic note dr Dellis Filbert), albuterol inhaler prn and bring inhaler, hydrazaline, metoprolol tartrate, singulair Diabetic medication -----none day of surgery Patient instructed no nail polish to be worn day of surgery Patient instructed to bring photo id and insurance card day of surgery Patient aware to have Driver (ride ) / caregiver    daughter Enis Gash will stay for 24 hours after surgery  Patient Special Instructions -----none Pre-Op special Istructions -----none Patient verbalized understanding of instructions that were given at this phone interview. Patient denies shortness of breath, chest pain, fever, cough at this phone interview. Pt uses cane for ambulation pt denies cardiac S & s at pre op call.  Medical clearance note dr Marzetta Board burns 10-28-2020 chart/epic Echo 07-08-2018 epic Vas US duplex bilateral 07-08-2018 epic Lov dr patel neurology 03-24-2020 epic Hemaglobin a1c 11-09-2020 epic  Pt has free style libre left upper arm Pt to take last dose 81 mg aspirin on 12-01-2020

## 2020-12-01 NOTE — Anesthesia Preprocedure Evaluation (Addendum)
Anesthesia Evaluation  Patient identified by MRN, date of birth, ID band Patient awake    Reviewed: Allergy & Precautions, NPO status , Patient's Chart, lab work & pertinent test results, reviewed documented beta blocker date and time   History of Anesthesia Complications Negative for: history of anesthetic complications  Airway Mallampati: II  TM Distance: >3 FB Neck ROM: Full    Dental  (+) Missing,    Pulmonary asthma , sleep apnea ,    Pulmonary exam normal        Cardiovascular hypertension, Pt. on medications and Pt. on home beta blockers Normal cardiovascular exam  TTE 2020: EF 60-65%, mild LVH, valves normal   Neuro/Psych CVA (2012, 2020, left hemiparesis) negative psych ROS   GI/Hepatic negative GI ROS, Neg liver ROS,   Endo/Other  diabetes, Type 2, Oral Hypoglycemic Agents  Renal/GU negative Renal ROS  negative genitourinary   Musculoskeletal negative musculoskeletal ROS (+)   Abdominal   Peds  Hematology negative hematology ROS (+)   Anesthesia Other Findings Day of surgery medications reviewed with patient.  Reproductive/Obstetrics Endometrial polyp                            Anesthesia Physical Anesthesia Plan  ASA: 3  Anesthesia Plan: General   Post-op Pain Management:    Induction: Intravenous  PONV Risk Score and Plan: 3 and Treatment may vary due to age or medical condition, Ondansetron, Dexamethasone and Midazolam  Airway Management Planned: LMA  Additional Equipment: None  Intra-op Plan:   Post-operative Plan: Extubation in OR  Informed Consent: I have reviewed the patients History and Physical, chart, labs and discussed the procedure including the risks, benefits and alternatives for the proposed anesthesia with the patient or authorized representative who has indicated his/her understanding and acceptance.     Dental advisory given  Plan Discussed  with: CRNA  Anesthesia Plan Comments:        Anesthesia Quick Evaluation

## 2020-12-02 ENCOUNTER — Encounter (HOSPITAL_BASED_OUTPATIENT_CLINIC_OR_DEPARTMENT_OTHER): Payer: Self-pay | Admitting: Obstetrics & Gynecology

## 2020-12-02 ENCOUNTER — Ambulatory Visit (HOSPITAL_BASED_OUTPATIENT_CLINIC_OR_DEPARTMENT_OTHER): Payer: BC Managed Care – PPO | Admitting: Anesthesiology

## 2020-12-02 ENCOUNTER — Ambulatory Visit (HOSPITAL_BASED_OUTPATIENT_CLINIC_OR_DEPARTMENT_OTHER)
Admission: RE | Admit: 2020-12-02 | Discharge: 2020-12-02 | Disposition: A | Discharge status: Home / Self Care | Payer: BC Managed Care – PPO | Attending: Obstetrics & Gynecology | Admitting: Obstetrics & Gynecology

## 2020-12-02 ENCOUNTER — Encounter (HOSPITAL_BASED_OUTPATIENT_CLINIC_OR_DEPARTMENT_OTHER)
Admission: RE | Disposition: A | Discharge status: Home / Self Care | Payer: Self-pay | Source: Home / Self Care | Attending: Obstetrics & Gynecology

## 2020-12-02 DIAGNOSIS — Z7902 Long term (current) use of antithrombotics/antiplatelets: Secondary | ICD-10-CM | POA: Diagnosis not present

## 2020-12-02 DIAGNOSIS — Z79899 Other long term (current) drug therapy: Secondary | ICD-10-CM | POA: Insufficient documentation

## 2020-12-02 DIAGNOSIS — Z7982 Long term (current) use of aspirin: Secondary | ICD-10-CM | POA: Diagnosis not present

## 2020-12-02 DIAGNOSIS — Z7984 Long term (current) use of oral hypoglycemic drugs: Secondary | ICD-10-CM | POA: Insufficient documentation

## 2020-12-02 DIAGNOSIS — N840 Polyp of corpus uteri: Secondary | ICD-10-CM | POA: Insufficient documentation

## 2020-12-02 DIAGNOSIS — Z888 Allergy status to other drugs, medicaments and biological substances status: Secondary | ICD-10-CM | POA: Diagnosis not present

## 2020-12-02 DIAGNOSIS — Z8673 Personal history of transient ischemic attack (TIA), and cerebral infarction without residual deficits: Secondary | ICD-10-CM | POA: Insufficient documentation

## 2020-12-02 HISTORY — DX: Nonspecific reaction to tuberculin skin test without active tuberculosis: R76.11

## 2020-12-02 HISTORY — PX: DILATATION & CURETTAGE/HYSTEROSCOPY WITH MYOSURE: SHX6511

## 2020-12-02 HISTORY — DX: Personal history of other medical treatment: Z92.89

## 2020-12-02 HISTORY — DX: Pneumonia, unspecified organism: J18.9

## 2020-12-02 LAB — CBC
HCT: 43.9 % (ref 36.0–46.0)
Hemoglobin: 14 g/dL (ref 12.0–15.0)
MCH: 28 pg (ref 26.0–34.0)
MCHC: 31.9 g/dL (ref 30.0–36.0)
MCV: 87.8 fL (ref 80.0–100.0)
Platelets: 292 10*3/uL (ref 150–400)
RBC: 5 MIL/uL (ref 3.87–5.11)
RDW: 13.6 % (ref 11.5–15.5)
WBC: 9 10*3/uL (ref 4.0–10.5)
nRBC: 0 % (ref 0.0–0.2)

## 2020-12-02 LAB — GLUCOSE, CAPILLARY
Glucose-Capillary: 120 mg/dL — ABNORMAL HIGH (ref 70–99)
Glucose-Capillary: 102 mg/dL — ABNORMAL HIGH (ref 70–99)

## 2020-12-02 SURGERY — DILATATION & CURETTAGE/HYSTEROSCOPY WITH MYOSURE
Anesthesia: General | Site: Vagina

## 2020-12-02 MED ORDER — PROPOFOL 10 MG/ML IV BOLUS
INTRAVENOUS | Status: DC | PRN
  Administered 2020-12-02: 150 mg via INTRAVENOUS

## 2020-12-02 MED ORDER — ONDANSETRON HCL 4 MG/2ML IJ SOLN
INTRAMUSCULAR | Status: DC | PRN
  Administered 2020-12-02: 4 mg via INTRAVENOUS

## 2020-12-02 MED ORDER — SODIUM CHLORIDE 0.9 % IR SOLN
Status: DC | PRN
  Administered 2020-12-02: 3000 mL

## 2020-12-02 MED ORDER — MIDAZOLAM HCL 2 MG/2ML IJ SOLN
INTRAMUSCULAR | Status: DC | PRN
  Administered 2020-12-02: 1 mg via INTRAVENOUS

## 2020-12-02 MED ORDER — SILVER NITRATE-POT NITRATE 75-25 % EX MISC
CUTANEOUS | Status: DC | PRN
  Administered 2020-12-02: 2

## 2020-12-02 MED ORDER — FENTANYL CITRATE (PF) 100 MCG/2ML IJ SOLN: 25.0000 ug | INTRAMUSCULAR | Status: DC | PRN

## 2020-12-02 MED ORDER — FENTANYL CITRATE (PF) 100 MCG/2ML IJ SOLN
INTRAMUSCULAR | Status: AC
  Filled 2020-12-02: qty 2

## 2020-12-02 MED ORDER — PROMETHAZINE HCL 25 MG/ML IJ SOLN
6.2500 mg | INTRAMUSCULAR | Status: DC | PRN
Start: 2020-12-02 — End: 2020-12-02

## 2020-12-02 MED ORDER — ACETAMINOPHEN 500 MG PO TABS
1000.0000 mg | ORAL_TABLET | Freq: Once | ORAL | Status: AC
  Administered 2020-12-02: 1000 mg via ORAL

## 2020-12-02 MED ORDER — PHENYLEPHRINE 40 MCG/ML (10ML) SYRINGE FOR IV PUSH (FOR BLOOD PRESSURE SUPPORT)
PREFILLED_SYRINGE | INTRAVENOUS | Status: DC | PRN
  Administered 2020-12-02: 80 ug via INTRAVENOUS

## 2020-12-02 MED ORDER — LIDOCAINE 2% (20 MG/ML) 5 ML SYRINGE
INTRAMUSCULAR | Status: DC | PRN
  Administered 2020-12-02: 100 mg via INTRAVENOUS

## 2020-12-02 MED ORDER — LACTATED RINGERS IV SOLN: INTRAVENOUS | Status: DC

## 2020-12-02 MED ORDER — OXYCODONE HCL 5 MG/5ML PO SOLN: 5.0000 mg | Freq: Once | ORAL | Status: DC | PRN

## 2020-12-02 MED ORDER — SODIUM CHLORIDE 0.9 % IV SOLN
INTRAVENOUS | Status: AC
  Filled 2020-12-02: qty 2

## 2020-12-02 MED ORDER — FENTANYL CITRATE (PF) 100 MCG/2ML IJ SOLN
INTRAMUSCULAR | Status: DC | PRN
  Administered 2020-12-02: 50 ug via INTRAVENOUS

## 2020-12-02 MED ORDER — EPHEDRINE SULFATE-NACL 50-0.9 MG/10ML-% IV SOSY
PREFILLED_SYRINGE | INTRAVENOUS | Status: DC | PRN
  Administered 2020-12-02: 10 mg via INTRAVENOUS

## 2020-12-02 MED ORDER — EPHEDRINE 5 MG/ML INJ
INTRAVENOUS | Status: AC
  Filled 2020-12-02: qty 10

## 2020-12-02 MED ORDER — PROPOFOL 10 MG/ML IV BOLUS
INTRAVENOUS | Status: AC
  Filled 2020-12-02: qty 20

## 2020-12-02 MED ORDER — LIDOCAINE HCL (PF) 2 % IJ SOLN
INTRAMUSCULAR | Status: AC
  Filled 2020-12-02: qty 5

## 2020-12-02 MED ORDER — DEXAMETHASONE SODIUM PHOSPHATE 10 MG/ML IJ SOLN
INTRAMUSCULAR | Status: DC | PRN
  Administered 2020-12-02: 4 mg via INTRAVENOUS

## 2020-12-02 MED ORDER — ONDANSETRON HCL 4 MG/2ML IJ SOLN
INTRAMUSCULAR | Status: AC
  Filled 2020-12-02: qty 2

## 2020-12-02 MED ORDER — DEXAMETHASONE SODIUM PHOSPHATE 10 MG/ML IJ SOLN
INTRAMUSCULAR | Status: AC
  Filled 2020-12-02: qty 1

## 2020-12-02 MED ORDER — MIDAZOLAM HCL 2 MG/2ML IJ SOLN
INTRAMUSCULAR | Status: AC
  Filled 2020-12-02: qty 2

## 2020-12-02 MED ORDER — OXYCODONE HCL 5 MG PO TABS
5.0000 mg | ORAL_TABLET | Freq: Once | ORAL | Status: DC | PRN
Start: 2020-12-02 — End: 2020-12-02

## 2020-12-02 MED ORDER — SODIUM CHLORIDE 0.9 % IV SOLN
2.0000 g | INTRAVENOUS | Status: AC
  Administered 2020-12-02: 2 g via INTRAVENOUS

## 2020-12-02 MED ORDER — PHENYLEPHRINE 40 MCG/ML (10ML) SYRINGE FOR IV PUSH (FOR BLOOD PRESSURE SUPPORT)
PREFILLED_SYRINGE | INTRAVENOUS | Status: AC
  Filled 2020-12-02: qty 10

## 2020-12-02 MED ORDER — ACETAMINOPHEN 500 MG PO TABS
ORAL_TABLET | ORAL | Status: AC
  Filled 2020-12-02: qty 2

## 2020-12-02 MED ORDER — POVIDONE-IODINE 10 % EX SWAB: 2.0000 "application " | Freq: Once | CUTANEOUS | Status: DC

## 2020-12-02 SURGICAL SUPPLY — 20 items
CATH ROBINSON RED A/P 16FR (CATHETERS) ×2 IMPLANT
DEVICE MYOSURE LITE (MISCELLANEOUS) IMPLANT
DEVICE MYOSURE REACH (MISCELLANEOUS) ×1 IMPLANT
DILATOR CANAL MILEX (MISCELLANEOUS) ×1 IMPLANT
ELECT REM PT RETURN 9FT ADLT (ELECTROSURGICAL)
ELECTRODE REM PT RTRN 9FT ADLT (ELECTROSURGICAL) IMPLANT
GAUZE 4X4 16PLY ~~LOC~~+RFID DBL (SPONGE) ×4 IMPLANT
GLOVE SURG ENC MOIS LTX SZ6.5 (GLOVE) ×2 IMPLANT
GLOVE SURG UNDER POLY LF SZ7 (GLOVE) ×4 IMPLANT
GOWN STRL REUS W/TWL LRG LVL3 (GOWN DISPOSABLE) ×4 IMPLANT
IV NS IRRIG 3000ML ARTHROMATIC (IV SOLUTION) ×2 IMPLANT
KIT PROCEDURE FLUENT (KITS) ×2 IMPLANT
KIT TURNOVER CYSTO (KITS) ×2 IMPLANT
MYOSURE XL FIBROID (MISCELLANEOUS)
PACK VAGINAL MINOR WOMEN LF (CUSTOM PROCEDURE TRAY) ×2 IMPLANT
PAD OB MATERNITY 4.3X12.25 (PERSONAL CARE ITEMS) ×2 IMPLANT
PAD PREP 24X48 CUFFED NSTRL (MISCELLANEOUS) ×2 IMPLANT
SEAL CERVICAL OMNI LOK (ABLATOR) IMPLANT
SEAL ROD LENS SCOPE MYOSURE (ABLATOR) ×2 IMPLANT
SYSTEM TISS REMOVAL MYOSURE XL (MISCELLANEOUS) IMPLANT

## 2020-12-02 NOTE — Transfer of Care (Signed)
Immediate Anesthesia Transfer of Care Note  Patient: Rebekah Kennedy  Procedure(s) Performed: Procedure(s) (LRB): DILATATION & CURETTAGE/HYSTEROSCOPY WITH MYOSURE (N/A)  Patient Location: PACU  Anesthesia Type: General  Level of Consciousness: awake, oriented, sedated and patient cooperative  Airway & Oxygen Therapy: Patient Spontanous Breathing and Patient connected to face mask oxygen  Post-op Assessment: Report given to PACU RN and Post -op Vital signs reviewed and stable  Post vital signs: Reviewed and stable  Complications: No apparent anesthesia complications Last Vitals:  Vitals Value Taken Time  BP 151/79 12/02/20 0946  Temp    Pulse 63 12/02/20 0948  Resp 9 12/02/20 0948  SpO2 100 % 12/02/20 0948  Vitals shown include unvalidated device data.  Last Pain:  Vitals:   12/02/20 0727  TempSrc: Oral  PainSc: 0-No pain      Patients Stated Pain Goal: 4 (05/30/01 4961)  Complications: No notable events documented.

## 2020-12-02 NOTE — Discharge Instructions (Signed)
DISCHARGE INSTRUCTIONS: HYSTEROSCOPY  The following instructions have been prepared to help you care for yourself upon your return home.   May take tylenol after 1:45pm today.    Personal hygiene:  Use sanitary pads for vaginal drainage, not tampons.  Shower the day after your procedure.  NO tub baths, pools or Jacuzzis for 2-3 weeks.  Wipe front to back after using the bathroom.  Activity and limitations:  Do NOT drive or operate any equipment for 24 hours. The effects of anesthesia are still present and drowsiness may result.  Do NOT rest in bed all day.  Walking is encouraged.  Walk up and down stairs slowly.  You may resume your normal activity in one to two days or as indicated by your physician. Sexual activity: NO intercourse for at least 2 weeks after the procedure, or as indicated by your Doctor.  Diet: Eat a light meal as desired this evening. You may resume your usual diet tomorrow.  Return to Work: You may resume your work activities in one to two days or as indicated by Marine scientist.  What to expect after your surgery: Expect to have vaginal bleeding/discharge for 2-3 days and spotting for up to 10 days. It is not unusual to have soreness for up to 1-2 weeks. You may have a slight burning sensation when you urinate for the first day. Mild cramps may continue for a couple of days. You may have a regular period in 2-6 weeks.  Call your doctor for any of the following:  Excessive vaginal bleeding or clotting, saturating and changing one pad every hour.  Inability to urinate 6 hours after discharge from hospital.  Pain not relieved by pain medication.  Fever of 100.4 F or greater.  Unusual vaginal discharge or odor.  Return to office _________________Call for an appointment ___________________ Patient's signature: ______________________ Nurse's signature ________________________   Post Anesthesia Home Care Instructions  Activity: Get plenty of rest for the  remainder of the day. A responsible individual must stay with you for 24 hours following the procedure.  For the next 24 hours, DO NOT: -Drive a car -Paediatric nurse -Drink alcoholic beverages -Take any medication unless instructed by your physician -Make any legal decisions or sign important papers.  Meals: Start with liquid foods such as gelatin or soup. Progress to regular foods as tolerated. Avoid greasy, spicy, heavy foods. If nausea and/or vomiting occur, drink only clear liquids until the nausea and/or vomiting subsides. Call your physician if vomiting continues.  Special Instructions/Symptoms: Your throat may feel dry or sore from the anesthesia or the breathing tube placed in your throat during surgery. If this causes discomfort, gargle with warm salt water. The discomfort should disappear within 24 hours.  If you had a scopolamine patch placed behind your ear for the management of post- operative nausea and/or vomiting:  1. The medication in the patch is effective for 72 hours, after which it should be removed.  Wrap patch in a tissue and discard in the trash. Wash hands thoroughly with soap and water. 2. You may remove the patch earlier than 72 hours if you experience unpleasant side effects which may include dry mouth, dizziness or visual disturbances. 3. Avoid touching the patch. Wash your hands with soap and water after contact with the patch.

## 2020-12-02 NOTE — Anesthesia Procedure Notes (Signed)
Procedure Name: LMA Insertion Date/Time: 12/02/2020 8:58 AM Performed by: Mechele Claude, CRNA Pre-anesthesia Checklist: Patient identified, Emergency Drugs available, Suction available and Patient being monitored Patient Re-evaluated:Patient Re-evaluated prior to induction Oxygen Delivery Method: Circle system utilized Preoxygenation: Pre-oxygenation with 100% oxygen Induction Type: IV induction Ventilation: Mask ventilation without difficulty LMA: LMA inserted LMA Size: 4.0 Number of attempts: 1 Airway Equipment and Method: Bite block Placement Confirmation: positive ETCO2 Tube secured with: Tape Dental Injury: Teeth and Oropharynx as per pre-operative assessment

## 2020-12-02 NOTE — H&P (Signed)
Rebekah Kennedy is an 65 y.o. female.  RP: [place] Myosure excision/D+C   HPI: No change x last visit 10/13/2020.  Had very light PMB, no current bleeding.  No pelvic pain.  Pelvic US showed a 1 cm endometrial lesion c/w a Polyp.  Medical clearance obtained.     Pertinent Gynecological History: Menses: post-menopausal Contraception: post menopausal status Blood transfusions:  Yes Sexually transmitted diseases: HR HPV Previous GYN Procedures:  LPS Lysis of Adhesions   Last mammogram: normal  Last pap: normal  OB History: G0  Menstrual History:  No LMP recorded. Patient is postmenopausal.    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   Retinal edema    gets steriod inj in the eyes     Sleep apnea    not needed since 100 pound weight loss 2019-2020   Splenomegaly    in college   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   left side pt states, left wide weak per pt   Stroke [facility]) 2012   right pontine cva with left hemiparesis    Past Surgical History:  Procedure Laterality Date   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   APPENDECTOMY     yrs ago per pt 0n 11-29-2020   BIOPSY THYROID  05/02/2011   Nonneoplastic goiter   BREAST BIOPSY     BREAST LUMPECTOMY WITH RADIOACTIVE SEED LOCALIZATION Left 07/25/2017   Procedure: LEFT BREAST LUMPECTOMY WITH RADIOACTIVE SEED  LOCALIZATION;  Surgeon: Coralie Keens, MD;  Location: [place];  Service: General;  Laterality: Left;   BREAST SURGERY     Reduction   cataracts Bilateral 2012   COLONOSCOPY  2021   COLPOSCOPY     DILATION AND CURETTAGE OF UTERUS  1975   DUB   EYE SURGERY     Laser for vision at times both eyes done   GASTRIC ROUX-EN-Y N/A 08/12/2017   Procedure: LAPAROSCOPIC ROUX-EN-Y GASTRIC BYPASS WITH HIATAL HERNIA REPAIR AND UPPER ENDOSCOPY;  Surgeon: Kieth Brightly, [name], MD;  Location: [facility];  Service: General;  Laterality: N/A;   GYNECOLOGIC CRYOSURGERY     MYOMECTOMY     OVARIAN CYST REMOVAL     PELVIC LAPAROSCOPY     DL lysis of adhesions 1975 and 1988    Family History  Problem Relation Age of Onset   Cancer Maternal Grandmother        Colon Cancer   Asthma Maternal Grandmother    Colon cancer Maternal Grandmother    Diabetes Father    Heart disease Father    Hypertension Father    Hyperlipidemia Father    Stroke Father        passed at age 71 from stroke   Hypertension Mother  Heart disease Mother    Ovarian cancer Mother    Cancer Mother        Lung cancer   Asthma Mother    COPD Mother    Hyperlipidemia Mother    Diabetes Brother    Kidney disease Brother    Hypertension Brother    Berenice Primas' disease Sister    Diabetes Sister    Breast cancer Sister 21   Graves' disease Paternal Grandmother    Hypertension Paternal Grandmother    Heart disease Paternal Grandmother    Alzheimer's disease Paternal Grandmother    Cancer Maternal Grandfather    Heart failure Brother    Prostate cancer Brother    Diabetes Brother    Asthma Brother    Hypertension Brother    Heart failure Brother    Prostate cancer Brother    Stroke Brother    Hypertension Brother     Social History:  reports that she has never smoked. She has never used smokeless tobacco. She reports previous alcohol use. She reports that she does not use drugs.  Allergies:  Allergies  Allergen Reactions    Quinapril Cough   Metformin And Related     Loose stool    Medications Prior to Admission  Medication Sig Dispense Refill Last Dose   albuterol (PROVENTIL HFA;VENTOLIN HFA) 108 (90 Base) MCG/ACT inhaler Inhale 2 puffs into the lungs every 4 (four) hours as needed for wheezing or shortness of breath. 18 g 2    amLODipine (NORVASC) 5 MG tablet TAKE 1 TABLET BY MOUTH EVERY DAY 90 tablet 1    ammonium lactate (AMLACTIN) 12 % lotion Apply 1 application topically as needed for dry skin. 400 g 5    aspirin EC 81 MG EC tablet Take 1 tablet (81 mg total) by mouth daily. 30 tablet 0    Biotin 5000 MCG TABS Take 10,000 mcg by mouth daily.       calcium-vitamin D (OSCAL WITH D) 500-200 MG-UNIT tablet Take 1 tablet by mouth. 1200 pm daily      Cholecalciferol (VITAMIN D3 PO) Take 2,000 Units by mouth daily.      clopidogrel (PLAVIX) 75 MG tablet TAKE 1 TABLET BY MOUTH EVERY DAY (Patient taking differently: 75 mg.) 90 tablet 1    Continuous Blood Gluc Receiver (FREESTYLE LIBRE 14 DAY READER) DEVI USE AS DIRECTED TO MONITOR SUGARS. DX E11.51 (Patient taking differently: USE AS DIRECTED TO MONITOR SUGARS. DX E11.51 Left upper arm) 1 each 0    Continuous Blood Gluc Receiver (FREESTYLE LIBRE 2 READER) DEVI Use as directed to check blood sugar 1 each 1    Continuous Blood Gluc Sensor (FREESTYLE LIBRE 2 SENSOR) MISC USE AS DIRECTED EVERY 2 WEEKS TO MONITOR BLOOD SUGARS AS INSTRUCTED 2 each 3    dapagliflozin propanediol (FARXIGA) 5 MG TABS tablet Take 1 tablet (5 mg total) by mouth daily before breakfast. 90 tablet 1    glucose blood (FREESTYLE PRECISION NEO TEST) test strip Use as instructed 100 each 12    glyBURIDE (DIABETA) 5 MG tablet Take 1 tablet (5 mg total) by mouth daily with breakfast. 30 tablet 5    hydrALAZINE (APRESOLINE) 25 MG tablet Take 1 tablet (25 mg total) by mouth 3 (three) times daily. 270 tablet 1    losartan (COZAAR) 100 MG tablet TAKE 1 TAB BY MOUTH ONCE DAILY. 90 tablet 1    metoprolol  succinate (TOPROL-XL) 100 MG 24 hr tablet TAKE 1 TABLET BY MOUTH DAILY WITH OR IMMEDIATELY  FOLLOWING A MEAL 90 tablet 1    montelukast (SINGULAIR) 10 MG tablet TAKE 1 TABLET BY MOUTH EVERY DAY 90 tablet 1    nystatin-triamcinolone ointment (MYCOLOG) APPLY TO AFFECTED AREA TWICE A DAY (Patient taking differently: as needed.) 30 g 4    OVER THE COUNTER MEDICATION Take 1 tablet by mouth daily. Multivitamin centrum 1 daily      rosuvastatin (CRESTOR) 10 MG tablet TAKE 1 TABLET BY MOUTH EVERY DAY 90 tablet 3    Semaglutide (RYBELSUS) 14 MG TABS Take 14 mg by mouth daily before breakfast. 90 tablet 1    tacrolimus (PROTOPIC) 0.1 % ointment Apply topically 2 (two) times daily. (Patient taking differently: Apply topically as needed.) 30 g 2     REVIEW OF SYSTEMS: A ROS was performed and pertinent positives and negatives are included in the history.  GENERAL: No fevers or chills. HEENT: No change in vision, no earache, sore throat or sinus congestion. NECK: No pain or stiffness. CARDIOVASCULAR: No chest pain or pressure. No palpitations. PULMONARY: No shortness of breath, cough or wheeze. GASTROINTESTINAL: No abdominal pain, nausea, vomiting or diarrhea, melena or bright red blood per rectum. GENITOURINARY: No urinary frequency, urgency, hesitancy or dysuria. MUSCULOSKELETAL: No joint or muscle pain, no back pain, no recent trauma. DERMATOLOGIC: No rash, no itching, no lesions. ENDOCRINE: No polyuria, polydipsia, no heat or cold intolerance. No recent change in weight. HEMATOLOGICAL: No anemia or easy bruising or bleeding. NEUROLOGIC: No headache, seizures, numbness, tingling or weakness. PSYCHIATRIC: No depression, no loss of interest in normal activity or change in sleep pattern.     Height 5\' 4"  (1.626 m), weight 76.2 kg.  Physical Exam:  Pelvic US 10/13/2020:   T/V images.  Retroverted uterus normal in size and shape with multiple small calcified fibroids the largest of which is measured at 1.7 x 1.4 cm.   The uterus is measured at 6.13 x 4.59 x 3.97 cm.  The endometrial lining is thickened with small cystic spaces compatible with an endometrial polyp measuring 1 x 0.5 cm.  No feeder vessel is identified.  The endometrial thickness is measured at 4.67 mm.  Both ovaries are normal.  No adnexal mass seen.  No free fluid in the posterior cul-de-sac.     Assessment/Plan:  66 y.o. G0P0    1. Endometrial polyp  Light postmenopausal bleeding with pelvic ultrasound revealing a probable endometrial polyp.  Family history of ovarian cancer with bilateral ovaries, no adnexal mass and no free fluid in the pelvis.  Decision to proceed with hysteroscopy, MyoSure excision of endometrial lesion and D&C.  Preop preparation, surgery with risks and benefits as well as postop management and precautions thoroughly reviewed with patient.  Medical clearance obtained.  Recommend for patient to take her heart and blood pressure medicine on the morning of surgery with a very small sip of water.  Recommend patient to continue on Plavix as the risks would be higher than the benefits to stop it for this kind of low hemorrhage risk procedure.  Patient voiced understanding and agreement with plan.                         Patient was counseled as to the risk of surgery to include the following:   1. Infection (prohylactic antibiotics will be administered)   2. DVT/Pulmonary Embolism (prophylactic pneumo compression stockings will be used)   3.Trauma to internal organs requiring additional surgical procedure to repair any injury to internal  organs requiring perhaps additional hospitalization days.   4.Hemmorhage requiring transfusion and blood products which carry risks such as anaphylactic reaction, hepatitis and AIDS   Patient had received literature information on the procedure scheduled and all her questions were answered and fully accepts all risk.       Marie-Lyne Hollynn Garno 12/02/2020, 7:16 AM

## 2020-12-02 NOTE — Anesthesia Postprocedure Evaluation (Signed)
Anesthesia Post Note  Patient: Rebekah Kennedy  Procedure(s) Performed: DILATATION & CURETTAGE/HYSTEROSCOPY WITH MYOSURE (Vagina )     Patient location during evaluation: PACU Anesthesia Type: General Level of consciousness: awake and alert and oriented Pain management: pain level controlled Vital Signs Assessment: post-procedure vital signs reviewed and stable Respiratory status: spontaneous breathing, nonlabored ventilation and respiratory function stable Cardiovascular status: blood pressure returned to baseline Postop Assessment: no apparent nausea or vomiting Anesthetic complications: no   No notable events documented.  Last Vitals:  Vitals:   12/02/20 1030 12/02/20 1100  BP: (!) 147/78 129/88  Pulse: 64 66  Resp: 20 16  Temp: (!) 36.4 C (!) 36.3 C  SpO2: 100% 100%    Last Pain:  Vitals:   12/02/20 1100  TempSrc:   PainSc: [place]

## 2020-12-02 NOTE — Op Note (Addendum)
Operative Note  12/02/2020  9:41 AM  PATIENT:  Rebekah Kennedy  65 y.o. female  PRE-OPERATIVE DIAGNOSIS:  Endometrial polyp  POST-OPERATIVE DIAGNOSIS:  Endometrial lesions with thickened vascularized endometrium  PROCEDURE:  Procedure(s): DILATATION & CURETTAGE/HYSTEROSCOPY WITH MYOSURE EXCISIONS  SURGEON:  Surgeon(s): [name], MD  ANESTHESIA:   general  FINDINGS: Endometrial vascularized thickenings  DESCRIPTION OF OPERATION: Under general anesthesia with laryngeal mask, the patient is in lithotomy position.  She is prepped with Betadine on the suprapubic, vulvar and vaginal areas.  The bladder is catheterized.  The patient is draped as usual.  The vaginal exam reveals an anteverted uterus, normal volume and mobile.  No adnexal mass.  The speculum is inserted into the vagina and the anterior lip of the cervix is grasped with a tenaculum.  We decided not to do a paracervical block given that patient is on Plavix.  We used the os finder to start dilation as the cervix is very narrow.  We then use Pratt dilators up to #19.  We start with a diagnostic hysteroscopy which reveals a thickened vascularized endometrium at many locations of the intra uterine cavity.  Both ostia are visualized.  Pictures are taken.  The diagnostic hysteroscope is then removed and the operative hysteroscope is inserted successfully.  We used MyoSure reach to excise every area of thickened vascularized endometrium.  Pictures are taken after.  The hysteroscope with MyoSure are removed from the intra uterine cavity.  No curettage is done given that the excision material was taken from all areas of the intra uterine cavity and we do not want to add to the risks of bleeding that a sharp curettage would possibly bring.  The tenaculum is removed.  We used silver nitrate on the cervix to complete hemostasis.  The speculum is removed.  The patient is brought to recovery room in good and stable status.  ESTIMATED BLOOD  LOSS: 10 mL FLUID DEFICIT: 470 mL  Intake/Output Summary (Last 24 hours) at 12/02/2020 0941 Last data filed at 12/02/2020 2956 Gross per 24 hour  Intake 800 ml  Output 10 ml  Net 790 ml     BLOOD ADMINISTERED:none   LOCAL MEDICATIONS USED:  NONE  SPECIMEN:  Source of Specimen:  Endometrial excisions material  DISPOSITION OF SPECIMEN:  PATHOLOGY  COUNTS:  YES  PLAN OF CARE: Transfer to PACU  Marie-Lyne LavoieMD9:41 AM

## 2020-12-05 ENCOUNTER — Encounter (HOSPITAL_BASED_OUTPATIENT_CLINIC_OR_DEPARTMENT_OTHER): Payer: Self-pay | Admitting: Obstetrics & Gynecology

## 2020-12-05 LAB — SURGICAL PATHOLOGY

## 2020-12-07 ENCOUNTER — Encounter: Payer: Self-pay | Admitting: Obstetrics & Gynecology

## 2020-12-07 ENCOUNTER — Telehealth: Payer: Self-pay | Admitting: *Deleted

## 2020-12-07 NOTE — Telephone Encounter (Signed)
Patient called post D&C on 12/02/20 reports she is doing well, no pain feels good. Patient said she did noticed small amount of blood post surgery. However on Monday she noticed more bleeding than normal, not heavy, wearing a pad and changing every 2 hours (her preference to keep clean) she is still not having any pain. She asked if this to be expected, I explain not abnormal to have post surgery bleeding and if bleeding should get heavier changing pad less than 30 minutes to let us know.  She then asked how long is bleeding typically after D&C? Please advise

## 2020-12-12 NOTE — Telephone Encounter (Signed)
Left detailed message on cell per DPR access. 

## 2020-12-16 ENCOUNTER — Ambulatory Visit (INDEPENDENT_AMBULATORY_CARE_PROVIDER_SITE_OTHER): Payer: BC Managed Care – PPO | Admitting: Obstetrics & Gynecology

## 2020-12-16 ENCOUNTER — Encounter: Payer: Self-pay | Admitting: Obstetrics & Gynecology

## 2020-12-16 ENCOUNTER — Other Ambulatory Visit: Payer: Self-pay

## 2020-12-16 VITALS — BP 106/68 | HR 73 | Resp 16

## 2020-12-16 DIAGNOSIS — Z09 Encounter for follow-up examination after completed treatment for conditions other than malignant neoplasm: Secondary | ICD-10-CM

## 2020-12-16 NOTE — Progress Notes (Signed)
    Rebekah Kennedy 17-Mar-1956 [id number]        65 y.o.  G0P0   RP: Postop HSC/Myosure excision of Polyp/D+C on 12/02/2020  HPI: Very good postop, no pelvic pain, minimal secretions tinted with blood.  No fever.   OB History  Gravida Para Term Preterm AB Living  0            SAB IAB Ectopic Multiple Live Births               Past medical history,surgical history, problem list, medications, allergies, family history and social history were all reviewed and documented in the EPIC chart.   Directed ROS with pertinent positives and negatives documented in the history of present illness/assessment and plan.  Exam:  Vitals:   12/16/20 0832  BP: 106/68  Pulse: 73  Resp: 16   General appearance:  Normal  Abdomen: Normal  Gynecologic exam: Vulva normal.  Bimanual:  Cervix NT, uterus normal, mobile.  No adnexal mass.  Vaginal secretions wnl.  No bleeding.  Patho 12/02/2020: FINAL MICROSCOPIC DIAGNOSIS:   A. ENDOMETRIUM, CURETTAGE:  -  Benign endometrial polyp  -  No hyperplasia or malignancy identified    Assessment/Plan:  65 y.o. G0P0   1. Status post gynecological surgery, follow-up exam  Good postop evolution without complication.  Pathology benign.  Patient reassured.  We will follow-up May 2023 for annual gynecologic exam.   [name] MD, 8:45 AM 12/16/2020

## 2020-12-26 ENCOUNTER — Other Ambulatory Visit: Payer: Self-pay

## 2020-12-26 ENCOUNTER — Ambulatory Visit: Payer: Medicare Other | Admitting: Neurology

## 2020-12-26 ENCOUNTER — Encounter: Payer: Self-pay | Admitting: Neurology

## 2020-12-26 VITALS — BP 149/75 | HR 80 | Ht 64.0 in | Wt 174.2 lb

## 2020-12-26 DIAGNOSIS — R2689 Other abnormalities of gait and mobility: Secondary | ICD-10-CM | POA: Diagnosis not present

## 2020-12-26 DIAGNOSIS — I69398 Other sequelae of cerebral infarction: Secondary | ICD-10-CM

## 2020-12-26 DIAGNOSIS — I6381 Other cerebral infarction due to occlusion or stenosis of small artery: Secondary | ICD-10-CM

## 2020-12-26 DIAGNOSIS — E1142 Type 2 diabetes mellitus with diabetic polyneuropathy: Secondary | ICD-10-CM | POA: Diagnosis not present

## 2020-12-26 DIAGNOSIS — I639 Cerebral infarction, unspecified: Secondary | ICD-10-CM

## 2020-12-26 NOTE — Progress Notes (Signed)
Follow-up Visit   Date: 12/26/20    Rebekah Kennedy MRN: [id number] DOB: 08-02-1955   Interim History: Rebekah Kennedy is a 65 y.o. right-handed African American female with history of uncontrolled diabetes mellitus (dx 2008, HbA1c 8.2), hyperlipidemia, hypertension, depression, and right pontine stroke (2012, mild left residual weakness) returning to the clinic with recent left thalamic stroke (06/2018) and right foot drop.  The patient was accompanied to the clinic by self.  History of present illness: UPDATE 12/27/2017:  She is here with new complaints of right >> left foot numbness over the top of the foot and toes, which started in June.  Symptoms are constant without identifiable triggers.  She has also noticed weakness with extending her toe on the right and slight dragging of the foot.  She does not have weakness of the left foot, but has noticed intermittent numbness.  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  Imaging did not show impingement of the L5 nerve, which would explain her foot drop.  She continues to have foot weakness and frequently trips.  She has started to use a cane for balance, which helps.  UPDATE 07/21/2018:  She suffered a left thalamic stroke on 2/17 manifesting with right numbness/tingling, dizziness, imbalance and fall.  She was started on aspirin 81 mg and Plavix 75 mg daily.  Over the past 2 weeks, she has noticed improved balance, but continues to have lack of coordination of the right hand.  She has been using a walker and is working with physical therapy.  Her right foot weakness has improved and she has been able to walk without using her AFO.  She continues to have some numbness and tingling of the right lower extremity.  UPDATE 02/09/2019:  She is here for follow-up.  She is doing well and feels that her gait and  coordination has improved.  She is walking without AFO and uses a cane for stability.  Fortunately, she has not suffered any falls. Her blood pressure and diabetes is under much better control.  She will be retiring October 1st and has been on disability since her stroke.  Numbness and tingling remains stable in the feet and fingertips.   Medications:  Current Outpatient Medications on File Prior to Visit  Medication Sig Dispense Refill   albuterol (PROVENTIL HFA;VENTOLIN HFA) 108 (90 Base) MCG/ACT inhaler Inhale 2 puffs into the lungs every 4 (four) hours as needed for wheezing or shortness of breath. 18 g 2   amLODipine (NORVASC) 5 MG tablet TAKE 1 TABLET BY MOUTH EVERY DAY 90 tablet 1   ammonium lactate (AMLACTIN) 12 % lotion Apply 1 application topically as needed for dry skin. 400 g 5   aspirin EC 81 MG EC tablet Take 1 tablet (81 mg total) by mouth daily. 30 tablet 0   Biotin 5000 MCG TABS Take 10,000 mcg by mouth daily.      calcium-vitamin D (OSCAL WITH D) 500-200 MG-UNIT tablet Take 1 tablet by mouth. 1200 pm daily     Cholecalciferol (VITAMIN D3 PO) Take 2,000 Units by mouth daily.     clopidogrel (PLAVIX) 75 MG tablet TAKE 1 TABLET BY MOUTH EVERY DAY (Patient taking differently: 75 mg.) 90 tablet 1   Continuous Blood Gluc Receiver (FREESTYLE LIBRE 14 DAY READER) DEVI USE AS DIRECTED TO MONITOR SUGARS. DX E11.51 (Patient taking  differently: USE AS DIRECTED TO MONITOR SUGARS. DX E11.51 Left upper arm) 1 each 0   Continuous Blood Gluc Receiver (FREESTYLE LIBRE 2 READER) DEVI Use as directed to check blood sugar 1 each 1   Continuous Blood Gluc Sensor (FREESTYLE LIBRE 2 SENSOR) MISC USE AS DIRECTED EVERY 2 WEEKS TO MONITOR BLOOD SUGARS AS INSTRUCTED 2 each 3   dapagliflozin propanediol (FARXIGA) 5 MG TABS tablet Take 1 tablet (5 mg total) by mouth daily before breakfast. 90 tablet 1   glucose blood (FREESTYLE PRECISION NEO TEST) test strip Use as instructed 100 each 12   glyBURIDE  (DIABETA) 5 MG tablet Take 1 tablet (5 mg total) by mouth daily with breakfast. 30 tablet 5   hydrALAZINE (APRESOLINE) 25 MG tablet Take 1 tablet (25 mg total) by mouth 3 (three) times daily. 270 tablet 1   losartan (COZAAR) 100 MG tablet TAKE 1 TAB BY MOUTH ONCE DAILY. 90 tablet 1   metoprolol succinate (TOPROL-XL) 100 MG 24 hr tablet TAKE 1 TABLET BY MOUTH DAILY WITH OR IMMEDIATELY FOLLOWING A MEAL 90 tablet 1   montelukast (SINGULAIR) 10 MG tablet TAKE 1 TABLET BY MOUTH EVERY DAY 90 tablet 1   nystatin-triamcinolone ointment (MYCOLOG) APPLY TO AFFECTED AREA TWICE A DAY (Patient taking differently: as needed.) 30 g 4   OVER THE COUNTER MEDICATION Take 1 tablet by mouth daily. Multivitamin centrum 1 daily     rosuvastatin (CRESTOR) 10 MG tablet TAKE 1 TABLET BY MOUTH EVERY DAY 90 tablet 3   Semaglutide (RYBELSUS) 14 MG TABS Take 14 mg by mouth daily before breakfast. 90 tablet 1   tacrolimus (PROTOPIC) 0.1 % ointment Apply topically 2 (two) times daily. (Patient taking differently: Apply topically as needed.) 30 g 2   No current facility-administered medications on file prior to visit.    Allergies:  Allergies  Allergen Reactions   Quinapril Cough   Metformin And Related     Loose stool     Vital Signs:  BP (!) 149/75   Pulse 80   Ht '5\' 4"'$  (1.626 m)   Wt 174 lb 3.2 oz (79 kg)   SpO2 97%   BMI 29.90 kg/m   Neurological Exam: MENTAL STATUS including orientation to time, place, person, recent and remote memory, attention span and concentration, language, and fund of knowledge is normal.  Speech is not dysarthric.  CRANIAL NERVES:  Visual fields intact.  Extraocular muscles intact. No ptosis.  Face is symmetric.    MOTOR:  No atrophy, no fasciculations or abnormal movements.  No pronator drift.  Tone is normal.   Right Upper Extremity:    Left Upper Extremity:    Deltoid  5/5   Deltoid  5/5   Biceps  5/5   Biceps  5/5   Triceps  5/5   Triceps  5/5   Wrist extensors  5/5    Wrist extensors  5/5   Wrist flexors  5/5   Wrist flexors  5/5   Finger extensors  5/5   Finger extensors  5/5   Finger flexors  5/5   Finger flexors  5/5   Dorsal interossei  5/5   Dorsal interossei  5/5   Abductor pollicis  5/5   Abductor pollicis  5/5   Tone (Ashworth scale)  0  Tone (Ashworth scale)  0   Right Lower Extremity:    Left Lower Extremity:    Hip flexors  5/5   Hip flexors  5/5   Hip extensors  5/5   Hip extensors  5/5   Knee flexors  5/5   Knee flexors  5/5   Knee extensors  5/5   Knee extensors  5/5   Dorsiflexors  5/5   Dorsiflexors  5/5   Plantarflexors  5/5   Plantarflexors  5/5   Eversion 5/5  Eversion 5/5  Inversion 5/5  Inversion 5/5  Toe extensors  5/5   Toe extensors  5/5   Toe flexors  5/5   Toe flexors  5/5   Tone (Ashworth scale)  0  Tone (Ashworth scale)  0   SENSATION:  Intact to pin prick and vibration at the ankles, reduced in the feet  COORDINATION/GAIT:  Trace dysmetria with finger to nose testing on the right.  Intact rapid alternating movements bilaterally.  She walks with a cane, mild unsteadiness.   Data: Lab Results  Component Value Date   CHOL 161 08/05/2020   HDL 72.40 08/05/2020   LDLCALC 64 08/05/2020   LDLDIRECT 126.7 03/24/2013   TRIG 124.0 08/05/2020   CHOLHDL 2 08/05/2020   Lab Results  Component Value Date   HGBA1C 8.2 (H) 11/09/2020   MRI brain 04/2011: 1. Acute lacunar type infarcts in the right mid brain and pons. No mass effect or hemorrhage.   2. Superimposed chronic lacunar infarct in the right paracentral pons. These findings indicate acute on chronic small vessel ischemia.   3. Otherwise mild for age nonspecific cerebral white matter signal changes.   4. Intracranial MRA findings are below.  MRA brain 04/2011: 1. 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 No major branch occlusion. There is irregularity suggesting atherosclerosis in the right proximal PCA.   2. Mild anterior  circulation atherosclerosis. No stenosis or major branch occlusion.  MRI lumbar spine 02/28/2018: 1. Mild disc bulging and facet hypertrophy at L3-4, L4-5, and L5-S1 contributes to mild foraminal narrowing. 2. No other significant stenosis is evident to explain foot drop.  MRI/A brain wo contrast 07/07/2018:  Small area of acute infarction left anterior thalamus.  Mild chronic microvascular ischemic changes in the white matter.  Intracranial atherosclerotic disease as above. Negative for emergent large vessel occlusion.  TTE 07/08/2018:  EF 60-65%, no PFO Lab Results  Component Value Date   CHOL 161 08/05/2020   HDL 72.40 08/05/2020   LDLCALC 64 08/05/2020   LDLDIRECT 126.7 03/24/2013   TRIG 124.0 08/05/2020   CHOLHDL 2 08/05/2020   Lab Results  Component Value Date   HGBA1C 8.2 (H) 11/09/2020     IMPRESSION/PLAN:  Multifactorial gait instability - Continue PT for balance training - Always use a walker - Counseling on appropriate shoes to wear to minimize risk of falls  2.  Cerebrovascular disease with R pontine stroke in 2012 with mild LHP + left thalamic stroke with R side ataxia  - Continue ASA '81mg'$  + plavix '75mg'$  daily  - Continue Crestor '10mg'$  (LDL 64)  - Diabetes and hypertension management per primary, BP higher today because she did not take AM medications   3.  Diabetic polyneuropathy (stable), R peroneal mononeuropathy (improved)  - Diabetes is higher today, HbA1c 8.2, up from 6.7  - She has a continuous glucose monitor and working with PCP to control this better  Return to clinic in 1 year   Thank you for allowing me to participate in patient's care.  If I can answer any additional questions, I would be pleased to do so.    Sincerely,    Jayme Cham K. Posey Pronto,  DO

## 2020-12-26 NOTE — Patient Instructions (Signed)
Return to clinic in 1 year.

## 2021-01-30 DIAGNOSIS — E113411 Type 2 diabetes mellitus with severe nonproliferative diabetic retinopathy with macular edema, right eye: Secondary | ICD-10-CM | POA: Diagnosis not present

## 2021-01-30 DIAGNOSIS — H35033 Hypertensive retinopathy, bilateral: Secondary | ICD-10-CM | POA: Diagnosis not present

## 2021-01-30 DIAGNOSIS — E113592 Type 2 diabetes mellitus with proliferative diabetic retinopathy without macular edema, left eye: Secondary | ICD-10-CM | POA: Diagnosis not present

## 2021-01-30 DIAGNOSIS — H43813 Vitreous degeneration, bilateral: Secondary | ICD-10-CM | POA: Diagnosis not present

## 2021-02-09 NOTE — Patient Instructions (Addendum)
  Blood work was ordered.     Flu immunization administered today.     Medications changes include :   none    Please followup in 6 months

## 2021-02-09 NOTE — Progress Notes (Signed)
Subjective:    Patient ID: Rebekah Kennedy, female    DOB: 06-12-1955, 65 y.o.   MRN: [id number]  This visit occurred during the SARS-CoV-2 public health emergency.  Safety protocols were in place, including screening questions prior to the visit, additional usage of staff PPE, and extensive cleaning of exam room while observing appropriate contact time as indicated for disinfecting solutions.     HPI The patient is here for follow up of their chronic medical problems, including htn, hld, h/o cva, DM, asthma, obesity  She is doing some exercise.  She just got on Medicare and will start Silver sneakers.  Medications and allergies reviewed with patient and updated if appropriate.  Patient Active Problem List   Diagnosis Date Noted   Right foot pain 10/19/2020   Dermatitis of eyelid, contact or allergic 07/31/2018   History of CVA with residual deficit, R sided weakness 07/14/2018   Left thalamic infarction ([place]) 07/12/2018   Carotid artery disease ([place]) 07/09/2018   Numbness and tingling 12/17/2017   Right foot drop 12/17/2017   Atherosclerosis of aorta ([place]) 10/09/2017   Bariatric surgery status, 07/2017 08/19/2017   Constipation 08/19/2017   Greater trochanteric bursitis of both hips 12/19/2016   OSA (obstructive sleep apnea) 07/26/2016   Diabetes ([place]) 07/19/2015   History of colonic polyps 12/01/2014   Partial nontraumatic tear of right rotator cuff 09/21/2013   Goiter 12/10/2011   Asthma 12/10/2011   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   History of TIA (transient ischemic attack) 02/13/2011   PROTEINURIA, MILD 09/15/2009   Hyperlipidemia 12/01/2006   Uncontrolled hypertension 07/07/2006    Current Outpatient Medications on File Prior to Visit  Medication Sig Dispense Refill   albuterol (PROVENTIL HFA;VENTOLIN HFA) 108 (90 Base) MCG/ACT inhaler Inhale 2 puffs into the lungs every 4 (four) hours as needed for wheezing or shortness of breath. 18 g 2   amLODipine  (NORVASC) 5 MG tablet TAKE 1 TABLET BY MOUTH EVERY DAY 90 tablet 1   ammonium lactate (AMLACTIN) 12 % lotion Apply 1 application topically as needed for dry skin. 400 g 5   aspirin EC 81 MG EC tablet Take 1 tablet (81 mg total) by mouth daily. 30 tablet 0   Biotin 5000 MCG TABS Take 10,000 mcg by mouth daily.      calcium-vitamin D (OSCAL WITH D) 500-200 MG-UNIT tablet Take 1 tablet by mouth. 1200 pm daily     Cholecalciferol (VITAMIN D3 PO) Take 2,000 Units by mouth daily.     clopidogrel (PLAVIX) 75 MG tablet TAKE 1 TABLET BY MOUTH EVERY DAY (Patient taking differently: 75 mg.) 90 tablet 1   Continuous Blood Gluc Receiver (FREESTYLE LIBRE 14 DAY READER) DEVI USE AS DIRECTED TO MONITOR SUGARS. DX E11.51 (Patient taking differently: USE AS DIRECTED TO MONITOR SUGARS. DX E11.51 Left upper arm) 1 each 0   Continuous Blood Gluc Receiver (FREESTYLE LIBRE 2 READER) DEVI Use as directed to check blood sugar 1 each 1   Continuous Blood Gluc Sensor (FREESTYLE LIBRE 2 SENSOR) MISC USE AS DIRECTED EVERY 2 WEEKS TO MONITOR BLOOD SUGARS AS INSTRUCTED 2 each 3   dapagliflozin propanediol (FARXIGA) 5 MG TABS tablet Take 1 tablet (5 mg total) by mouth daily before breakfast. 90 tablet 1   glucose blood (FREESTYLE PRECISION NEO TEST) test strip Use as instructed 100 each 12   glyBURIDE (DIABETA) 5 MG tablet Take 1 tablet (5 mg total) by mouth daily with breakfast. 30 tablet  5   hydrALAZINE (APRESOLINE) 25 MG tablet Take 1 tablet (25 mg total) by mouth 3 (three) times daily. 270 tablet 1   losartan (COZAAR) 100 MG tablet TAKE 1 TAB BY MOUTH ONCE DAILY. 90 tablet 1   metoprolol succinate (TOPROL-XL) 100 MG 24 hr tablet TAKE 1 TABLET BY MOUTH DAILY WITH OR IMMEDIATELY FOLLOWING A MEAL 90 tablet 1   montelukast (SINGULAIR) 10 MG tablet TAKE 1 TABLET BY MOUTH EVERY DAY 90 tablet 1   nystatin-triamcinolone ointment (MYCOLOG) APPLY TO AFFECTED AREA TWICE A DAY (Patient taking differently: as needed.) 30 g 4   OVER THE  COUNTER MEDICATION Take 1 tablet by mouth daily. Multivitamin centrum 1 daily     rosuvastatin (CRESTOR) 10 MG tablet TAKE 1 TABLET BY MOUTH EVERY DAY 90 tablet 3   Semaglutide (RYBELSUS) 14 MG TABS Take 14 mg by mouth daily before breakfast. 90 tablet 1   tacrolimus (PROTOPIC) 0.1 % ointment Apply topically 2 (two) times daily. (Patient taking differently: Apply topically as needed.) 30 g 2   No current facility-administered medications on file prior to visit.    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   Retinal edema    gets steriod inj in the eyes     Sleep apnea    not needed since 100 pound weight loss 2019-2020   Splenomegaly    in college   Stroke (cerebrum) ([place]) 06/2018   left side pt states, left wide weak per pt   Stroke [facility] 2012   right pontine cva with left hemiparesis    Past Surgical History:  Procedure Laterality Date   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   APPENDECTOMY     yrs ago per pt 0n 11-29-2020   BIOPSY THYROID  05/02/2011   Nonneoplastic goiter   BREAST BIOPSY     BREAST LUMPECTOMY WITH RADIOACTIVE SEED LOCALIZATION Left 07/25/2017   Procedure: LEFT BREAST LUMPECTOMY WITH RADIOACTIVE SEED LOCALIZATION;  Surgeon: Coralie Keens, MD;  Location: [place];  Service: General;  Laterality: Left;   BREAST SURGERY     Reduction    cataracts Bilateral 2012   COLONOSCOPY  2021   COLPOSCOPY     DILATATION & CURETTAGE/HYSTEROSCOPY WITH MYOSURE N/A 12/02/2020   Procedure: DILATATION & CURETTAGE/HYSTEROSCOPY WITH MYOSURE;  Surgeon: [name], MD;  Location: [place];  Service: Gynecology;  Laterality: N/A;   DILATION AND CURETTAGE OF UTERUS  1975   DUB   EYE SURGERY     Laser for vision at times both eyes done   GASTRIC ROUX-EN-Y N/A 08/12/2017   Procedure: LAPAROSCOPIC ROUX-EN-Y GASTRIC BYPASS WITH HIATAL HERNIA REPAIR AND UPPER ENDOSCOPY;  Surgeon: [name], [name], MD;  Location:  [facility];  Service: General;  Laterality: N/A;   GYNECOLOGIC CRYOSURGERY     MYOMECTOMY     OVARIAN CYST REMOVAL     PELVIC LAPAROSCOPY     DL lysis of adhesions 1975 and 1988    Social History   Socioeconomic History   Marital status: Single    Spouse name: Not on file   Number of children: 2   Years of education: 16   Highest education level: Bachelor's degree (e.g., BA, AB, BS)  Occupational History   Occupation: TEACHER    Employer: [employer]  Tobacco Use   Smoking status: Never   Smokeless tobacco: Never  Vaping Use   Vaping Use: Never used  Substance and Sexual Activity   Alcohol use: Not Currently    Alcohol/week: 0.0 standard drinks   Drug use: Never   Sexual activity: Not Currently    Birth control/protection: Post-menopausal  Other Topics Concern   Not on file  Social History Narrative   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   Living with daughter      Right handed      4 steps inside of the home      Social Determinants of Health   Financial Resource Strain: Not on file  Food Insecurity: Not on file  Transportation Needs: Not on file  Physical Activity: Not on file  Stress: Not on file  Social Connections: Not on file    Family History  Problem Relation Age of Onset   Cancer Maternal Grandmother         Colon Cancer   Asthma Maternal Grandmother    Colon cancer Maternal Grandmother    Diabetes Father    Heart disease Father    Hypertension Father    Hyperlipidemia Father    Stroke Father        passed at age 45 from stroke   Hypertension Mother    Heart disease Mother    Ovarian cancer Mother    Cancer Mother        Lung cancer   Asthma Mother    COPD Mother    Hyperlipidemia Mother    Diabetes Brother    Kidney disease Brother    Hypertension Brother    Graves' disease Sister    Diabetes Sister    Breast cancer Sister 5   Graves' disease Paternal Grandmother    Hypertension Paternal Grandmother    Heart disease Paternal Grandmother    Alzheimer's disease Paternal Grandmother    Cancer Maternal Grandfather    Heart failure Brother    Prostate cancer Brother    Diabetes Brother    Asthma Brother    Hypertension Brother    Heart failure Brother    Prostate cancer Brother    Stroke Brother    Hypertension Brother     Review of Systems  Constitutional:  Negative for chills and fever.  Respiratory:  Negative for cough, shortness of breath and wheezing.   Cardiovascular:  Negative for chest pain, palpitations and leg swelling.  Neurological:  Negative for dizziness, light-headedness, numbness and headaches.      Objective:   Vitals:   02/10/21 0843  BP: 130/74  Pulse: 85  Temp: 98.1 F (36.7 C)  SpO2: 99%   BP Readings from Last 3 Encounters:  02/10/21 130/74  12/26/20 (!) 149/75  12/16/20 106/68   Wt Readings from Last 3 Encounters:  02/10/21 174  lb (78.9 kg)  12/26/20 174 lb 3.2 oz (79 kg)  12/02/20 173 lb 6.4 oz (78.7 kg)   Body mass index is 29.87 kg/m.   Physical Exam    Constitutional: Appears well-developed and well-nourished. No distress.  HENT:  Head: Normocephalic and atraumatic.  Neck: Neck supple. No tracheal deviation present. No thyromegaly present.  No cervical lymphadenopathy Cardiovascular: Normal rate, regular rhythm and normal  heart sounds.   No murmur heard. No carotid bruit .  No edema Pulmonary/Chest: Effort normal and breath sounds normal. No respiratory distress. No has no wheezes. No rales.  Skin: Skin is warm and dry. Not diaphoretic.  Psychiatric: Normal mood and affect. Behavior is normal.      Assessment & Plan:    See Problem List for Assessment and Plan of chronic medical problems.

## 2021-02-10 ENCOUNTER — Other Ambulatory Visit: Payer: Self-pay

## 2021-02-10 ENCOUNTER — Encounter: Payer: Self-pay | Admitting: Internal Medicine

## 2021-02-10 ENCOUNTER — Other Ambulatory Visit: Payer: Self-pay | Admitting: Internal Medicine

## 2021-02-10 ENCOUNTER — Ambulatory Visit (INDEPENDENT_AMBULATORY_CARE_PROVIDER_SITE_OTHER): Payer: Medicare Other | Admitting: Internal Medicine

## 2021-02-10 VITALS — BP 130/74 | HR 85 | Temp 98.1°F | Ht 64.0 in | Wt 174.0 lb

## 2021-02-10 DIAGNOSIS — Z23 Encounter for immunization: Secondary | ICD-10-CM | POA: Diagnosis not present

## 2021-02-10 DIAGNOSIS — E7849 Other hyperlipidemia: Secondary | ICD-10-CM

## 2021-02-10 DIAGNOSIS — J4520 Mild intermittent asthma, uncomplicated: Secondary | ICD-10-CM

## 2021-02-10 DIAGNOSIS — I700 Atherosclerosis of aorta: Secondary | ICD-10-CM | POA: Diagnosis not present

## 2021-02-10 DIAGNOSIS — I10 Essential (primary) hypertension: Secondary | ICD-10-CM | POA: Diagnosis not present

## 2021-02-10 DIAGNOSIS — E1151 Type 2 diabetes mellitus with diabetic peripheral angiopathy without gangrene: Secondary | ICD-10-CM

## 2021-02-10 DIAGNOSIS — I6930 Unspecified sequelae of cerebral infarction: Secondary | ICD-10-CM

## 2021-02-10 LAB — LIPID PANEL
Cholesterol: 171 mg/dL (ref 0–200)
HDL: 72.3 mg/dL (ref >39.00)
LDL Cholesterol: 75 mg/dL (ref 0–99)
NonHDL: 98.66
Total CHOL/HDL Ratio: 2
Triglycerides: 118 mg/dL (ref 0.0–149.0)
VLDL: 23.6 mg/dL (ref 0.0–40.0)

## 2021-02-10 LAB — COMPREHENSIVE METABOLIC PANEL
ALT: 23 U/L (ref 0–35)
AST: 18 U/L (ref 0–37)
Albumin: 4.1 g/dL (ref 3.5–5.2)
Alkaline Phosphatase: 90 U/L (ref 39–117)
BUN: 17 mg/dL (ref 6–23)
CO2: 31 mEq/L (ref 19–32)
Calcium: 9.5 mg/dL (ref 8.4–10.5)
Chloride: 104 mEq/L (ref 96–112)
Creatinine, Ser: 0.71 mg/dL (ref 0.40–1.20)
GFR: 89.81 mL/min (ref >60.00)
Glucose, Bld: 134 mg/dL — ABNORMAL HIGH (ref 70–99)
Potassium: 4.7 mEq/L (ref 3.5–5.1)
Sodium: 141 mEq/L (ref 135–145)
Total Bilirubin: 0.6 mg/dL (ref 0.2–1.2)
Total Protein: 7.2 g/dL (ref 6.0–8.3)

## 2021-02-10 LAB — HEMOGLOBIN A1C: Hgb A1c MFr Bld: 7.4 % — ABNORMAL HIGH (ref 4.6–6.5)

## 2021-02-10 MED ORDER — GLYBURIDE 5 MG PO TABS: 5.0000 mg | ORAL_TABLET | Freq: Two times a day (BID) | ORAL | 1 refills | Status: DC

## 2021-02-10 MED ORDER — RYBELSUS 14 MG PO TABS: 14.0000 mg | ORAL_TABLET | Freq: Every day | ORAL | 1 refills | Status: DC

## 2021-02-10 NOTE — Assessment & Plan Note (Signed)
Chronic Mild, intermittent Asthma has been controlled Continue Singulair nightly and albuterol as needed

## 2021-02-10 NOTE — Assessment & Plan Note (Signed)
Chronic Continue plavix 75 mg daily, asa 81 mg daily, crestor 10 mg daily BP well controlled

## 2021-02-10 NOTE — Assessment & Plan Note (Deleted)
Chronic Continue plavix 75 mg daily, asa 81 mg daily, crestor 10 mg daily BP well lcontrolled

## 2021-02-10 NOTE — Assessment & Plan Note (Signed)
Chronic Lab Results  Component Value Date   HGBA1C 8.2 (H) 11/09/2020   Not ideally controlled Check A1c today Continue Farxiga 5 mg daily (did not tolerate 10 mg), glyburide 5 mg daily with breakfast and Rybelsus 14 mg daily Stressed diabetic diet, exercise and weight loss

## 2021-02-10 NOTE — Assessment & Plan Note (Signed)
Chronic Lab Results  Component Value Date   LDLCALC 64 08/05/2020   LDL at goal Continue rosuvastatin 10 mg daily Healthy diet and regular exercise stressed

## 2021-02-10 NOTE — Assessment & Plan Note (Signed)
Chronic Blood pressure well controlled Continue amlodipine 5 mg daily, hydralazine 25 mg 3 times daily, losartan 100 mg daily, metoprolol XL 100 mg daily CMP

## 2021-02-10 NOTE — Assessment & Plan Note (Signed)
Chronic Check lipid panel  Continue Crestor 10 mg daily Regular exercise and healthy diet encouraged

## 2021-02-16 ENCOUNTER — Encounter: Payer: Self-pay | Admitting: Internal Medicine

## 2021-02-28 ENCOUNTER — Other Ambulatory Visit: Payer: Self-pay | Admitting: Internal Medicine

## 2021-03-16 ENCOUNTER — Encounter (HOSPITAL_COMMUNITY): Payer: Self-pay | Admitting: *Deleted

## 2021-03-24 ENCOUNTER — Telehealth (INDEPENDENT_AMBULATORY_CARE_PROVIDER_SITE_OTHER): Payer: Medicare Other | Admitting: Nurse Practitioner

## 2021-03-24 ENCOUNTER — Other Ambulatory Visit: Payer: Self-pay

## 2021-03-24 VITALS — Temp 98.4°F

## 2021-03-24 DIAGNOSIS — J029 Acute pharyngitis, unspecified: Secondary | ICD-10-CM | POA: Insufficient documentation

## 2021-03-24 LAB — POCT RAPID STREP A (OFFICE): Rapid Strep A Screen: NEGATIVE

## 2021-03-24 NOTE — Progress Notes (Signed)
Patient ID: Rebekah Kennedy, female    DOB: March 14, 1956, 65 y.o.   MRN: [id number]  Virtual visit completed through [employer], a video enabled telemedicine application. Due to national recommendations of social distancing due to COVID-19, a virtual visit is felt to be most appropriate for this patient at this time. Reviewed limitations, risks, security and privacy concerns of performing a virtual visit and the availability of in person appointments. I also reviewed that there may be a patient responsible charge related to this service. The patient agreed to proceed.   Patient location: home Provider location: [place] at [facility], office Persons participating in this virtual visit: patient, provider   If any vitals were documented, they were collected by patient at home unless specified below.    Temp 98.4 F (36.9 C)    CC: Sore Throat  Subjective:   HPI: Rebekah Kennedy is a 65 y.o. female presenting on 03/24/2021 for Sore Throat (Started about 03/22/21-Lymph nodes feel swollen. Keeps getting worse. Low grade fever 100 or 101 on 03/23/21)   Symptoms started last thursday Symptoms  worsened 03/22/2021 At home covid test Pfizer x2 and one booster Been doing gargles, Chlorseptic spray, tylenol, [place] throat syrup. Minimal relief.    Relevant past medical, surgical, family and social history reviewed and updated as indicated. Interim medical history since our last visit reviewed. Allergies and medications reviewed and updated. Outpatient Medications Prior to Visit  Medication Sig Dispense Refill   albuterol (PROVENTIL HFA;VENTOLIN HFA) 108 (90 Base) MCG/ACT inhaler Inhale 2 puffs into the lungs every 4 (four) hours as needed for wheezing or shortness of breath. 18 g 2   amLODipine (NORVASC) 5 MG tablet TAKE 1 TABLET BY MOUTH EVERY DAY 90 tablet 1   ammonium lactate (AMLACTIN) 12 % lotion Apply 1 application topically as needed for dry skin. 400 g 5   aspirin EC 81 MG EC  tablet Take 1 tablet (81 mg total) by mouth daily. 30 tablet 0   Biotin 5000 MCG TABS Take 10,000 mcg by mouth daily.      calcium-vitamin D (OSCAL WITH D) 500-200 MG-UNIT tablet Take 1 tablet by mouth. 1200 pm daily     Cholecalciferol (VITAMIN D3 PO) Take 2,000 Units by mouth daily.     clopidogrel (PLAVIX) 75 MG tablet TAKE 1 TABLET BY MOUTH EVERY DAY 90 tablet 1   Continuous Blood Gluc Receiver (FREESTYLE LIBRE 14 DAY READER) DEVI USE AS DIRECTED TO MONITOR SUGARS. DX E11.51 (Patient taking differently: USE AS DIRECTED TO MONITOR SUGARS. DX E11.51 Left upper arm) 1 each 0   Continuous Blood Gluc Receiver (FREESTYLE LIBRE 2 READER) DEVI Use as directed to check blood sugar 1 each 1   Continuous Blood Gluc Sensor (FREESTYLE LIBRE 2 SENSOR) MISC USE AS DIRECTED EVERY 2 WEEKS TO MONITOR BLOOD SUGARS AS INSTRUCTED 2 each 3   dapagliflozin propanediol (FARXIGA) 5 MG TABS tablet Take 1 tablet (5 mg total) by mouth daily before breakfast. 90 tablet 1   glucose blood (FREESTYLE PRECISION NEO TEST) test strip Use as instructed 100 each 12   glyBURIDE (DIABETA) 5 MG tablet Take 1 tablet (5 mg total) by mouth 2 (two) times daily with a meal. 180 tablet 1   hydrALAZINE (APRESOLINE) 25 MG tablet Take 1 tablet (25 mg total) by mouth 3 (three) times daily. 270 tablet 1   losartan (COZAAR) 100 MG tablet TAKE 1 TABLET BY MOUTH EVERY DAY 90 tablet 1   metoprolol succinate (TOPROL-XL)  100 MG 24 hr tablet TAKE 1 TABLET BY MOUTH DAILY WITH OR IMMEDIATELY FOLLOWING A MEAL 90 tablet 1   montelukast (SINGULAIR) 10 MG tablet TAKE 1 TABLET BY MOUTH EVERY DAY 90 tablet 1   nystatin-triamcinolone ointment (MYCOLOG) APPLY TO AFFECTED AREA TWICE A DAY (Patient taking differently: as needed.) 30 g 4   OVER THE COUNTER MEDICATION Take 1 tablet by mouth daily. Multivitamin centrum 1 daily     rosuvastatin (CRESTOR) 10 MG tablet TAKE 1 TABLET BY MOUTH EVERY DAY 90 tablet 3   Semaglutide (RYBELSUS) 14 MG TABS Take 14 mg by mouth  daily before breakfast. 90 tablet 1   tacrolimus (PROTOPIC) 0.1 % ointment Apply topically 2 (two) times daily. (Patient taking differently: Apply topically as needed.) 30 g 2   No facility-administered medications prior to visit.     Per HPI unless specifically indicated in ROS section below Review of Systems  Constitutional:  Positive for chills and fatigue. Negative for fever.  HENT:  Positive for ear pain (drainage from ears into throat) and sore throat. Negative for congestion, postnasal drip, rhinorrhea and sinus pain.   Respiratory:  Positive for cough and shortness of breath (esp with coughing).   Cardiovascular:  Negative for chest pain.  Gastrointestinal:  Negative for abdominal pain, diarrhea, nausea and vomiting.  Musculoskeletal:  Negative for arthralgias and myalgias.  Neurological:  Negative for headaches.  Objective:  Temp 98.4 F (36.9 C)   Wt Readings from Last 3 Encounters:  02/10/21 174 lb (78.9 kg)  12/26/20 174 lb 3.2 oz (79 kg)  12/02/20 173 lb 6.4 oz (78.7 kg)       Physical exam: Gen: alert, NAD, not ill appearing Pulm: speaks in complete sentences without increased work of breathing Psych: normal mood, normal thought content      Results for orders placed or performed in visit on 02/10/21  Lipid panel  Result Value Ref Range   Cholesterol 171 0 - 200 mg/dL   Triglycerides 118.0 0.0 - 149.0 mg/dL   HDL 72.30 >39.00 mg/dL   VLDL 23.6 0.0 - 40.0 mg/dL   LDL Cholesterol 75 0 - 99 mg/dL   Total CHOL/HDL Ratio 2    NonHDL 98.66   Hemoglobin A1c  Result Value Ref Range   Hgb A1c MFr Bld 7.4 (H) 4.6 - 6.5 %  Comprehensive metabolic panel  Result Value Ref Range   Sodium 141 135 - 145 mEq/L   Potassium 4.7 3.5 - 5.1 mEq/L   Chloride 104 96 - 112 mEq/L   CO2 31 19 - 32 mEq/L   Glucose, Bld 134 (H) 70 - 99 mg/dL   BUN 17 6 - 23 mg/dL   Creatinine, Ser 0.71 0.40 - 1.20 mg/dL   Total Bilirubin 0.6 0.2 - 1.2 mg/dL   Alkaline Phosphatase 90 39 - 117  U/L   AST 18 0 - 37 U/L   ALT 23 0 - 35 U/L   Total Protein 7.2 6.0 - 8.3 g/dL   Albumin 4.1 3.5 - 5.2 g/dL   GFR 89.81 >60.00 mL/min   Calcium 9.5 8.4 - 10.5 mg/dL   Assessment & Plan:   Problem List Items Addressed This Visit       Respiratory   Viral pharyngitis - Primary    Patient strep test was negative did review and recommend over-the-counter treatment for sore throat signs and symptoms reviewed when to seek urgent or emergent health care.  Patient knowledge understand she can touch base with  me on Monday via MyChart if not improving.  States all symptoms are better but just the sore throat we will continue to monitor.  Patient can continue taking Tylenol would avoid ibuprofen given history.        No orders of the defined types were placed in this encounter.  No orders of the defined types were placed in this encounter.   I discussed the assessment and treatment plan with the patient. The patient was provided an opportunity to ask questions and all were answered. The patient agreed with the plan and demonstrated an understanding of the instructions. The patient was advised to call back or seek an in-person evaluation if the symptoms worsen or if the condition fails to improve as anticipated.  Follow up plan: No follow-ups on file.  Romilda Garret, NP

## 2021-03-24 NOTE — Assessment & Plan Note (Signed)
Patient strep test was negative did review and recommend over-the-counter treatment for sore throat signs and symptoms reviewed when to seek urgent or emergent health care.  Patient knowledge understand she can touch base with me on Monday via MyChart if not improving.  States all symptoms are better but just the sore throat we will continue to monitor.  Patient can continue taking Tylenol would avoid ibuprofen given history.

## 2021-03-27 ENCOUNTER — Emergency Department (HOSPITAL_COMMUNITY)
Admission: EM | Admit: 2021-03-27 | Discharge: 2021-03-28 | Disposition: A | Discharge status: Home / Self Care | Payer: Medicare Other | Attending: Emergency Medicine | Admitting: Emergency Medicine

## 2021-03-27 ENCOUNTER — Encounter (HOSPITAL_COMMUNITY): Payer: Self-pay | Admitting: Emergency Medicine

## 2021-03-27 ENCOUNTER — Other Ambulatory Visit: Payer: Self-pay

## 2021-03-27 DIAGNOSIS — Z79899 Other long term (current) drug therapy: Secondary | ICD-10-CM | POA: Diagnosis not present

## 2021-03-27 DIAGNOSIS — M25462 Effusion, left knee: Secondary | ICD-10-CM | POA: Diagnosis not present

## 2021-03-27 DIAGNOSIS — Z7982 Long term (current) use of aspirin: Secondary | ICD-10-CM | POA: Diagnosis not present

## 2021-03-27 DIAGNOSIS — Y92009 Unspecified place in unspecified non-institutional (private) residence as the place of occurrence of the external cause: Secondary | ICD-10-CM | POA: Insufficient documentation

## 2021-03-27 DIAGNOSIS — I10 Essential (primary) hypertension: Secondary | ICD-10-CM | POA: Diagnosis not present

## 2021-03-27 DIAGNOSIS — M25562 Pain in left knee: Secondary | ICD-10-CM | POA: Insufficient documentation

## 2021-03-27 DIAGNOSIS — Z7902 Long term (current) use of antithrombotics/antiplatelets: Secondary | ICD-10-CM | POA: Insufficient documentation

## 2021-03-27 DIAGNOSIS — J45909 Unspecified asthma, uncomplicated: Secondary | ICD-10-CM | POA: Insufficient documentation

## 2021-03-27 DIAGNOSIS — E119 Type 2 diabetes mellitus without complications: Secondary | ICD-10-CM | POA: Insufficient documentation

## 2021-03-27 DIAGNOSIS — M1712 Unilateral primary osteoarthritis, left knee: Secondary | ICD-10-CM | POA: Diagnosis not present

## 2021-03-27 DIAGNOSIS — W010XXA Fall on same level from slipping, tripping and stumbling without subsequent striking against object, initial encounter: Secondary | ICD-10-CM | POA: Diagnosis not present

## 2021-03-27 NOTE — ED Triage Notes (Signed)
Patient slipped and fell at home this evening , denies LOC /ambulatory , reports pain at left knee .

## 2021-03-27 NOTE — ED Provider Notes (Signed)
Emergency Medicine Provider Triage Evaluation Note  Rebekah Kennedy , a 65 y.o. female  was evaluated in triage.  Pt complains of left knee pain S/p fall. Slipped while wearing her slipped & fell on the left knee. Denies head injury or LOC.   Review of Systems  Positive: Left knee pain Negative: Headache, neck pain, back pain, chest pain, abdominal pain.   Physical Exam  BP (!) 159/81   Pulse 88   Temp 98.2 F (36.8 C) (Oral)   Resp 16   SpO2 100%  Gen:   Awake, no distress   Resp:  Normal effort  MSK:   No midline spinal tenderness or chest/abdominal tenderness. L knee tenderness to palpation anteriorly, 2+ DP pulse, sensation grossly intact distally, able to plantar/dorsiflex against resistance.   Medical Decision Making  Medically screening exam initiated at 11:31 PM.  Appropriate orders placed.  Rebekah Kennedy was informed that the remainder of the evaluation will be completed by another provider, this initial triage assessment does not replace that evaluation, and the importance of remaining in the ED until their evaluation is complete.  [address] 03/27/21 2333    Fatima Blank, MD 03/28/21 [phone]

## 2021-03-28 ENCOUNTER — Emergency Department (HOSPITAL_COMMUNITY): Payer: Medicare Other

## 2021-03-28 ENCOUNTER — Encounter: Payer: Self-pay | Admitting: Internal Medicine

## 2021-03-28 DIAGNOSIS — M25462 Effusion, left knee: Secondary | ICD-10-CM | POA: Diagnosis not present

## 2021-03-28 DIAGNOSIS — M25562 Pain in left knee: Secondary | ICD-10-CM | POA: Insufficient documentation

## 2021-03-28 DIAGNOSIS — M1712 Unilateral primary osteoarthritis, left knee: Secondary | ICD-10-CM | POA: Diagnosis not present

## 2021-03-28 NOTE — ED Notes (Addendum)
Called for room x5

## 2021-03-28 NOTE — Progress Notes (Signed)
Orthopedic Tech Progress Note Patient Details:  Rebekah Kennedy 05-06-1956 [id number]  Ortho Devices Type of Ortho Device: Knee Sleeve Ortho Device/Splint Location: LLE Ortho Device/Splint Interventions: Ordered, Application   Post Interventions Patient Tolerated: Well Instructions Provided: [facility] [place] 03/28/2021, 8:37 AM

## 2021-03-28 NOTE — ED Provider Notes (Signed)
[facility] EMERGENCY DEPARTMENT Provider Note   CSN: [id number] Arrival date & time: 03/27/21  2019     History Chief Complaint  Patient presents with   Fall / Knee INjury    Rebekah Kennedy is a 65 y.o. female who presents today for evaluation of fall and left knee pain. She states that she was reaching over too far and slipped and fell injuring her left knee.  She denies striking her head or passing out.  No prodromal symptoms. She reports that originally she had difficulty ambulating due to pain, however this has been improving over the past few hours.  She does not take any blood thinning medications.  She denies any new weakness or numbness.  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   Retinal edema    gets steriod inj in the eyes     Sleep apnea    not needed since 100 pound weight loss 2019-2020   Splenomegaly    in college   Stroke (cerebrum) ([place]) 06/2018   left side pt states, left wide weak per pt   Stroke [facility]) 2012   right pontine cva with left hemiparesis    Patient Active Problem List   Diagnosis Date Noted   Viral pharyngitis 03/24/2021   Right foot pain 10/19/2020   Dermatitis of eyelid, contact or allergic 07/31/2018   History of CVA with residual deficit, R sided weakness 07/14/2018   Left  thalamic infarction ([place]) 07/12/2018   Carotid artery disease ([place]) 07/09/2018   Right foot drop 12/17/2017   Atherosclerosis of aorta ([place]) 10/09/2017   Bariatric surgery status, 07/2017 08/19/2017   Constipation 08/19/2017   Greater trochanteric bursitis of both hips 12/19/2016   OSA (obstructive sleep apnea) 07/26/2016   Diabetes ([place]) 07/19/2015   History of colonic polyps 12/01/2014   Partial nontraumatic tear of right rotator cuff 09/21/2013   Goiter 12/10/2011   Asthma 12/10/2011   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   History of TIA (transient ischemic attack) 02/13/2011   PROTEINURIA, MILD 09/15/2009   Hyperlipidemia 12/01/2006   Hypertension 07/07/2006    Past Surgical History:  Procedure Laterality Date   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   APPENDECTOMY     yrs ago per pt 0n 11-29-2020   BIOPSY THYROID  05/02/2011   Nonneoplastic goiter   BREAST BIOPSY     BREAST LUMPECTOMY WITH RADIOACTIVE SEED LOCALIZATION Left 07/25/2017   Procedure: LEFT BREAST LUMPECTOMY WITH RADIOACTIVE SEED LOCALIZATION;  Surgeon: Coralie Keens, MD;  Location: [place];  Service: General;  Laterality: Left;   BREAST SURGERY     Reduction   cataracts Bilateral 2012   COLONOSCOPY  2021   COLPOSCOPY     DILATATION & CURETTAGE/HYSTEROSCOPY WITH MYOSURE N/A 12/02/2020   Procedure: DILATATION & CURETTAGE/HYSTEROSCOPY WITH MYOSURE;  Surgeon: [name], MD;  Location: [place];  Service: Gynecology;  Laterality: N/A;   DILATION AND CURETTAGE OF UTERUS  1975   DUB   EYE SURGERY     Laser for vision at times both eyes done   GASTRIC ROUX-EN-Y N/A 08/12/2017   Procedure: LAPAROSCOPIC ROUX-EN-Y GASTRIC BYPASS WITH HIATAL HERNIA REPAIR AND UPPER ENDOSCOPY;  Surgeon: [name], [name], MD;  Location: [facility];  Service: General;  Laterality: N/A;   GYNECOLOGIC CRYOSURGERY     MYOMECTOMY     OVARIAN CYST REMOVAL     PELVIC LAPAROSCOPY     DL lysis of adhesions 1975 and  1988     OB History     Gravida  0   Para      Term      Preterm      AB      Living         SAB      IAB      Ectopic      Multiple      Live Births              Family History  Problem Relation Age of Onset   Cancer Maternal Grandmother        Colon Cancer   Asthma Maternal Grandmother    Colon cancer Maternal Grandmother    Diabetes Father    Heart disease Father    Hypertension Father    Hyperlipidemia Father    Stroke Father        passed at age 71 from stroke   Hypertension Mother    Heart disease Mother    Ovarian cancer Mother    Cancer Mother        Lung cancer   Asthma Mother    COPD Mother    Hyperlipidemia Mother    Diabetes Brother    Kidney disease Brother    Hypertension Brother    Graves' disease Sister    Diabetes Sister    Breast cancer Sister 35   Graves' disease Paternal Grandmother    Hypertension Paternal Grandmother    Heart disease Paternal Grandmother    Alzheimer's disease Paternal Grandmother    Cancer Maternal Grandfather    Heart failure Brother    Prostate cancer Brother    Diabetes Brother    Asthma Brother    Hypertension Brother    Heart failure Brother    Prostate cancer Brother    Stroke Brother    Hypertension Brother     Social History   Tobacco Use   Smoking status: Never   Smokeless tobacco: Never  Vaping Use   Vaping Use: Never used  Substance Use Topics   Alcohol use: Not Currently    Alcohol/week: 0.0 standard drinks   Drug use: Never    Home Medications Prior to Admission medications   Medication Sig Start Date End Date Taking? Authorizing Provider  albuterol (PROVENTIL HFA;VENTOLIN HFA) 108 (90 Base) MCG/ACT inhaler Inhale 2 puffs into the lungs every 4 (four) hours as needed for wheezing or shortness of breath. 07/23/17   Binnie Rail, MD  amLODipine (NORVASC) 5 MG tablet TAKE 1 TABLET BY MOUTH EVERY DAY 11/14/20   Binnie Rail, MD  ammonium lactate (AMLACTIN) 12 % lotion Apply  1 application topically as needed for dry skin.  11/09/20   Binnie Rail, MD  aspirin EC 81 MG EC tablet Take 1 tablet (81 mg total) by mouth daily. 07/09/18   Aline August, MD  Biotin 5000 MCG TABS Take 10,000 mcg by mouth daily.     [provider]  calcium-vitamin D (OSCAL WITH D) 500-200 MG-UNIT tablet Take 1 tablet by mouth. 1200 pm daily    [provider]  Cholecalciferol (VITAMIN D3 PO) Take 2,000 Units by mouth daily.    [provider]  clopidogrel (PLAVIX) 75 MG tablet TAKE 1 TABLET BY MOUTH EVERY DAY 02/28/21   Burns, Claudina Lick, MD  Continuous Blood Gluc Receiver (FREESTYLE LIBRE 66 DAY READER) DEVI USE AS DIRECTED TO MONITOR SUGARS. DX E11.51 Patient taking differently: USE AS DIRECTED TO MONITOR SUGARS. DX E11.51 Left upper arm 07/11/20   Binnie Rail, MD  Continuous Blood Gluc Receiver (FREESTYLE LIBRE 2 READER) DEVI Use as directed to check blood sugar 07/11/20   Burns, Claudina Lick, MD  Continuous Blood Gluc Sensor (FREESTYLE LIBRE 2 SENSOR) MISC USE AS DIRECTED EVERY 2 WEEKS TO MONITOR BLOOD SUGARS AS INSTRUCTED 11/17/20   Binnie Rail, MD  dapagliflozin propanediol (FARXIGA) 5 MG TABS tablet Take 1 tablet (5 mg total) by mouth daily before breakfast. 11/17/20   Binnie Rail, MD  glucose blood (FREESTYLE PRECISION NEO TEST) test strip Use as instructed 08/17/20   Binnie Rail, MD  glyBURIDE (DIABETA) 5 MG tablet Take 1 tablet (5 mg total) by mouth 2 (two) times daily with a meal. 02/10/21   Burns, Claudina Lick, MD  hydrALAZINE (APRESOLINE) 25 MG tablet Take 1 tablet (25 mg total) by mouth 3 (three) times daily. 07/02/19   Binnie Rail, MD  losartan (COZAAR) 100 MG tablet TAKE 1 TABLET BY MOUTH EVERY DAY 02/28/21   Binnie Rail, MD  metoprolol succinate (TOPROL-XL) 100 MG 24 hr tablet TAKE 1 TABLET BY MOUTH DAILY WITH OR IMMEDIATELY FOLLOWING A MEAL 05/06/20   Burns, Claudina Lick, MD  montelukast (SINGULAIR) 10 MG tablet TAKE 1 TABLET BY MOUTH EVERY DAY 11/25/20    Burns, Claudina Lick, MD  nystatin-triamcinolone ointment (MYCOLOG) APPLY TO AFFECTED AREA TWICE A DAY Patient taking differently: as needed. 11/15/20   [name], MD  OVER THE COUNTER MEDICATION Take 1 tablet by mouth daily. Multivitamin centrum 1 daily    [provider]  rosuvastatin (CRESTOR) 10 MG tablet TAKE 1 TABLET BY MOUTH EVERY DAY 11/28/20   Burns, Claudina Lick, MD  Semaglutide (RYBELSUS) 14 MG TABS Take 14 mg by mouth daily before breakfast. 02/10/21   Binnie Rail, MD  tacrolimus (PROTOPIC) 0.1 % ointment Apply topically 2 (two) times daily. Patient taking differently: Apply topically as needed. 11/09/20   Binnie Rail, MD    Allergies    Quinapril and Metformin and related  Review of Systems   Review of Systems  Constitutional:  Negative for chills and fever.  HENT:  Negative for congestion.   Respiratory:  Negative for cough.   Musculoskeletal:        Left knee pain and swelling  Skin:  Negative for color change, rash and wound.  Neurological:  Negative for weakness, numbness and headaches.  All other systems reviewed and are negative.  Physical Exam Updated Vital Signs BP 134/73   Pulse 84   Temp 98.8 F (37.1 C) (Oral)   Resp 18   Ht 5\' 4"  (1.626 m)   Wt 85 kg   SpO2  98%   BMI 32.17 kg/m   Physical Exam Vitals and nursing note reviewed.  Constitutional:      General: She is not in acute distress.    Appearance: She is not diaphoretic.  HENT:     Head: Normocephalic and atraumatic.  Eyes:     General: No scleral icterus.       Right eye: No discharge.        Left eye: No discharge.     Conjunctiva/sclera: Conjunctivae normal.  Cardiovascular:     Rate and Rhythm: Normal rate.     Pulses: Normal pulses.     Comments: Left foot with intact peripheral pulses Pulmonary:     Effort: Pulmonary effort is normal. No respiratory distress.     Breath sounds: No stridor.  Abdominal:     General: There is no distension.  Musculoskeletal:         General: No deformity.     Cervical back: Normal range of motion.     Comments: Left knee is swollen compared to right.  There is no obvious crepitus or deformity.  5/5 strength left ankle dorsiflexion and plantarflexion.  She is able to lift her left leg.  She has tenderness to palpation primarily along the medial joint line of the left knee.  Compartments in left upper and lower leg are soft and easily compressible.  Skin:    General: Skin is warm and dry.     Comments: Small, old/healing scab over anterior knee subcentimeter  Neurological:     Mental Status: She is alert. Mental status is at baseline.     Motor: No abnormal muscle tone.     Comments: Sensation intact to light touch to left lower leg.  Psychiatric:        Behavior: Behavior normal.    ED Results / Procedures / Treatments   Labs (all labs ordered are listed, but only abnormal results are displayed) Labs Reviewed - No data to display  EKG None  Radiology DG Knee Complete 4 Views Left  Result Date: 03/28/2021 CLINICAL DATA:  Pain, fall. EXAM: LEFT KNEE - COMPLETE 4+ VIEW COMPARISON:  None. FINDINGS: There is mildly decreased mineralization of the bones. No acute fracture or dislocation is seen. Joint space narrowing and osteophyte formation is noted in all 3 compartments and most pronounced in the patellofemoral compartment. There is bony irregularity along the posterior patella, possible chondromalacia patella. A small to moderate joint effusion is noted. IMPRESSION: 1. No acute fracture or dislocation. 2. Small to moderate joint effusion. 3. Tricompartmental degenerative changes. Electronically Signed   By: Brett Fairy M.D.   On: 03/28/2021 00:35    Procedures Procedures   Medications Ordered in ED Medications - No data to display  ED Course  I have reviewed the triage vital signs and the nursing notes.  Pertinent labs & imaging results that were available during my care of the patient were reviewed by me and  considered in my medical decision making (see chart for details).  Clinical Course as of 03/28/21 0852  Tue Mar 28, 2021  0807 I offered patient tylenol, she currently declined.  [EH]    Clinical Course User Index [EH] Ollen Gross   MDM Rules/Calculators/A&P                          Patient is a 65 year old woman who presents today for evaluation of a mechanical nonsyncopal fall that occurred after she  reports she was leaning over too far. She denies striking her head or passing out, reports her only injury is her left knee. On exam her left knee is slightly swollen when compared to right.  She is distally neurovascularly intact with soft and easily compressible compartments in left upper and lower leg.  I offered her pain medication which she declined.  She states that as time is gone on since the fall she is been better able to ambulate with decreased pain. Patient is given a hinged knee sleeve, after which she is able to ambulate in the department.  She has a cane and a walker already at home.  She feels like she will be able to get around okay at home.   Return precautions were discussed with patient who states their understanding.  At the time of discharge patient denied any unaddressed complaints or concerns.  Patient is agreeable for discharge home.  Note: Portions of this report may have been transcribed using voice recognition software. Every effort was made to ensure accuracy; however, inadvertent computerized transcription errors may be present   Final Clinical Impression(s) / ED Diagnoses Final diagnoses:  Acute pain of left knee    Rx / DC Orders ED Discharge Orders     None        Lorin Glass, PA-C 03/28/21 [id number]    Isla Pence, MD 03/28/21 [phone]

## 2021-03-28 NOTE — Progress Notes (Deleted)
Subjective:    Patient ID: Rebekah Kennedy, female    DOB: 12-27-1955, 65 y.o.   MRN: [id number]  This visit occurred during the SARS-CoV-2 public health emergency.  Safety protocols were in place, including screening questions prior to the visit, additional usage of staff PPE, and extensive cleaning of exam room while observing appropriate contact time as indicated for disinfecting solutions.    HPI The patient is here for an acute visit.  She fell 11/7 and went to the ED for evaluation. She was reaching too far, slipped and fell.   She had left knee pain after the fall and an xray in the ED showed no acute injury.  There were no other injuries - no head injury or LOC.  Her left knee was swollen.  She had normal strength.  Discharged home w/o medication.      Medications and allergies reviewed with patient and updated if appropriate.  Patient Active Problem List   Diagnosis Date Noted   Viral pharyngitis 03/24/2021   Right foot pain 10/19/2020   Dermatitis of eyelid, contact or allergic 07/31/2018   History of CVA with residual deficit, R sided weakness 07/14/2018   Left thalamic infarction ([place]) 07/12/2018   Carotid artery disease ([place]) 07/09/2018   Right foot drop 12/17/2017   Atherosclerosis of aorta ([place]) 10/09/2017   Bariatric surgery status, 07/2017 08/19/2017   Constipation 08/19/2017   Greater trochanteric bursitis of both hips 12/19/2016   OSA (obstructive sleep apnea) 07/26/2016   Diabetes ([place]) 07/19/2015   History of colonic polyps 12/01/2014   Partial nontraumatic tear of right rotator cuff 09/21/2013   Goiter 12/10/2011   Asthma 12/10/2011   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   History of TIA (transient ischemic attack) 02/13/2011   PROTEINURIA, MILD 09/15/2009   Hyperlipidemia 12/01/2006   Hypertension 07/07/2006    Current Outpatient Medications on File Prior to Visit  Medication Sig Dispense Refill   albuterol (PROVENTIL HFA;VENTOLIN HFA) 108  (90 Base) MCG/ACT inhaler Inhale 2 puffs into the lungs every 4 (four) hours as needed for wheezing or shortness of breath. 18 g 2   amLODipine (NORVASC) 5 MG tablet TAKE 1 TABLET BY MOUTH EVERY DAY 90 tablet 1   ammonium lactate (AMLACTIN) 12 % lotion Apply 1 application topically as needed for dry skin. 400 g 5   aspirin EC 81 MG EC tablet Take 1 tablet (81 mg total) by mouth daily. 30 tablet 0   Biotin 5000 MCG TABS Take 10,000 mcg by mouth daily.      calcium-vitamin D (OSCAL WITH D) 500-200 MG-UNIT tablet Take 1 tablet by mouth. 1200 pm daily     Cholecalciferol (VITAMIN D3 PO) Take 2,000 Units by mouth daily.     clopidogrel (PLAVIX) 75 MG tablet TAKE 1 TABLET BY MOUTH EVERY DAY 90 tablet 1   Continuous Blood Gluc Receiver (FREESTYLE LIBRE 14 DAY READER) DEVI USE AS DIRECTED TO MONITOR SUGARS. DX E11.51 (Patient taking differently: USE AS DIRECTED TO MONITOR SUGARS. DX E11.51 Left upper arm) 1 each 0   Continuous Blood Gluc Receiver (FREESTYLE LIBRE 2 READER) DEVI Use as directed to check blood sugar 1 each 1   Continuous Blood Gluc Sensor (FREESTYLE LIBRE 2 SENSOR) MISC USE AS DIRECTED EVERY 2 WEEKS TO MONITOR BLOOD SUGARS AS INSTRUCTED 2 each 3   dapagliflozin propanediol (FARXIGA) 5 MG TABS tablet Take 1 tablet (5 mg total) by mouth daily before breakfast. 90 tablet 1   glucose blood (  FREESTYLE PRECISION NEO TEST) test strip Use as instructed 100 each 12   glyBURIDE (DIABETA) 5 MG tablet Take 1 tablet (5 mg total) by mouth 2 (two) times daily with a meal. 180 tablet 1   hydrALAZINE (APRESOLINE) 25 MG tablet Take 1 tablet (25 mg total) by mouth 3 (three) times daily. 270 tablet 1   losartan (COZAAR) 100 MG tablet TAKE 1 TABLET BY MOUTH EVERY DAY 90 tablet 1   metoprolol succinate (TOPROL-XL) 100 MG 24 hr tablet TAKE 1 TABLET BY MOUTH DAILY WITH OR IMMEDIATELY FOLLOWING A MEAL 90 tablet 1   montelukast (SINGULAIR) 10 MG tablet TAKE 1 TABLET BY MOUTH EVERY DAY 90 tablet 1    nystatin-triamcinolone ointment (MYCOLOG) APPLY TO AFFECTED AREA TWICE A DAY (Patient taking differently: as needed.) 30 g 4   OVER THE COUNTER MEDICATION Take 1 tablet by mouth daily. Multivitamin centrum 1 daily     rosuvastatin (CRESTOR) 10 MG tablet TAKE 1 TABLET BY MOUTH EVERY DAY 90 tablet 3   Semaglutide (RYBELSUS) 14 MG TABS Take 14 mg by mouth daily before breakfast. 90 tablet 1   tacrolimus (PROTOPIC) 0.1 % ointment Apply topically 2 (two) times daily. (Patient taking differently: Apply topically as needed.) 30 g 2   No current facility-administered medications on file prior to visit.    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   Retinal edema    gets steriod inj in the eyes     Sleep apnea    not needed since 100 pound weight loss 2019-2020   Splenomegaly    in college   Stroke (cerebrum) ([place]) 06/2018   left side pt states, left wide weak per pt   Stroke [facility]) 2012   right pontine cva with left hemiparesis    Past Surgical History:  Procedure Laterality Date   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   APPENDECTOMY     yrs ago per pt 0n 11-29-2020   BIOPSY THYROID  05/02/2011   Nonneoplastic goiter   BREAST BIOPSY     BREAST LUMPECTOMY WITH RADIOACTIVE SEED LOCALIZATION Left 07/25/2017   Procedure: LEFT BREAST LUMPECTOMY WITH RADIOACTIVE SEED  LOCALIZATION;  Surgeon: Coralie Keens, MD;  Location: [facility];  Service: General;  Laterality: Left;   BREAST SURGERY     Reduction   cataracts Bilateral 2012   COLONOSCOPY  2021   COLPOSCOPY     DILATATION & CURETTAGE/HYSTEROSCOPY WITH MYOSURE N/A 12/02/2020   Procedure: DILATATION & CURETTAGE/HYSTEROSCOPY WITH MYOSURE;  Surgeon: [name], MD;  Location: [facility];  Service: Gynecology;  Laterality: N/A;   DILATION AND CURETTAGE OF UTERUS  1975   DUB   EYE SURGERY  Laser for vision at times both eyes done   GASTRIC ROUX-EN-Y N/A 08/12/2017   Procedure: LAPAROSCOPIC ROUX-EN-Y GASTRIC BYPASS WITH HIATAL HERNIA REPAIR AND UPPER ENDOSCOPY;  Surgeon: [name], [name], MD;  Location[facility];  Service: General;  Laterality: N/A;   GYNECOLOGIC CRYOSURGERY     MYOMECTOMY     OVARIAN CYST REMOVAL     PELVIC LAPAROSCOPY     DL lysis of adhesions 1975 and 1988    Social History   Socioeconomic History   Marital status: Single    Spouse name: Not on file   Number of children: 2   Years of education: 16   Highest education level: Bachelor's degree (e.g., BA, AB, BS)  Occupational History   Occupation: TEACHER    Employer: [employer]  Tobacco Use   Smoking status: Never   Smokeless tobacco: Never  Vaping Use   Vaping Use: Never used  Substance and Sexual Activity   Alcohol use: Not Currently    Alcohol/week: 0.0 standard drinks   Drug use: Never   Sexual activity: Not Currently    Birth control/protection: Post-menopausal  Other Topics Concern   Not on file  Social History Narrative   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   Living with daughter      Right handed      4 steps inside of the home      Social Determinants of Health   Financial Resource Strain: Not on file  Food Insecurity: Not on file  Transportation Needs: Not on file  Physical Activity: Not on file   Stress: Not on file  Social Connections: Not on file    Family History  Problem Relation Age of Onset   Cancer Maternal Grandmother        Colon Cancer   Asthma Maternal Grandmother    Colon cancer Maternal Grandmother    Diabetes Father    Heart disease Father    Hypertension Father    Hyperlipidemia Father    Stroke Father        passed at age 73 from stroke   Hypertension Mother    Heart disease Mother    Ovarian cancer Mother    Cancer Mother        Lung cancer   Asthma Mother    COPD Mother    Hyperlipidemia Mother    Diabetes Brother    Kidney disease Brother    Hypertension Brother    Graves' disease Sister    Diabetes Sister    Breast cancer Sister 33   Graves' disease Paternal Grandmother    Hypertension Paternal Grandmother    Heart disease Paternal Grandmother    Alzheimer's disease Paternal Grandmother    Cancer Maternal Grandfather    Heart failure Brother    Prostate cancer Brother    Diabetes Brother    Asthma Brother    Hypertension Brother    Heart failure Brother    Prostate cancer Brother    Stroke Brother    Hypertension Brother     Review of Systems     Objective:  There were no vitals filed for this visit. BP Readings from Last 3 Encounters:  03/28/21 134/73  02/10/21 130/74  12/26/20 (!) 149/75   Wt Readings from Last 3 Encounters:  03/27/21 187 lb 6.3 oz (85 kg)  02/10/21 174 lb (78.9 kg)  12/26/20 174 lb 3.2 oz (79 kg)  There is no height or weight on file to calculate BMI.   Physical Exam         Assessment & Plan:    See Problem List for Assessment and Plan of chronic medical problems.

## 2021-03-28 NOTE — Discharge Instructions (Addendum)
Please take Tylenol (acetaminophen) to relieve your pain.  You may take tylenol, up to 1,000 mg (two extra strength pills).  Do not take more than 3,000 mg tylenol in a 24 hour period.  Please check all medication labels as many medications such as pain and cold medications may contain tylenol. Please do not drink alcohol while taking this medication.  ° °

## 2021-03-28 NOTE — ED Notes (Signed)
Pt verbalized understanding of d/c instructions, meds, and followup care. Denies questions. VSS, no distress noted. Stable on feet with use of knee brace and cane. Assisted to lobby via wheelchair.

## 2021-03-29 ENCOUNTER — Ambulatory Visit: Payer: Medicare Other | Admitting: Internal Medicine

## 2021-03-29 DIAGNOSIS — M25562 Pain in left knee: Secondary | ICD-10-CM

## 2021-04-21 ENCOUNTER — Other Ambulatory Visit: Payer: Self-pay | Admitting: Internal Medicine

## 2021-05-01 DIAGNOSIS — H43813 Vitreous degeneration, bilateral: Secondary | ICD-10-CM | POA: Diagnosis not present

## 2021-05-01 DIAGNOSIS — E113592 Type 2 diabetes mellitus with proliferative diabetic retinopathy without macular edema, left eye: Secondary | ICD-10-CM | POA: Diagnosis not present

## 2021-05-01 DIAGNOSIS — H35033 Hypertensive retinopathy, bilateral: Secondary | ICD-10-CM | POA: Diagnosis not present

## 2021-05-01 DIAGNOSIS — E113411 Type 2 diabetes mellitus with severe nonproliferative diabetic retinopathy with macular edema, right eye: Secondary | ICD-10-CM | POA: Diagnosis not present

## 2021-05-07 ENCOUNTER — Other Ambulatory Visit: Payer: Self-pay | Admitting: Internal Medicine

## 2021-05-08 ENCOUNTER — Other Ambulatory Visit: Payer: Self-pay | Admitting: Internal Medicine

## 2021-05-08 DIAGNOSIS — R000 Tachycardia, unspecified: Secondary | ICD-10-CM

## 2021-05-08 DIAGNOSIS — I10 Essential (primary) hypertension: Secondary | ICD-10-CM

## 2021-05-10 ENCOUNTER — Ambulatory Visit: Payer: BC Managed Care – PPO | Admitting: Internal Medicine

## 2021-06-05 ENCOUNTER — Other Ambulatory Visit: Payer: Self-pay | Admitting: Internal Medicine

## 2021-06-11 NOTE — Progress Notes (Signed)
Subjective:    Patient ID: Rebekah Kennedy, female    DOB: 12-Jul-1955, 66 y.o.   MRN: [id number]  This visit occurred during the SARS-CoV-2 public health emergency.  Safety protocols were in place, including screening questions prior to the visit, additional usage of staff PPE, and extensive cleaning of exam room while observing appropriate contact time as indicated for disinfecting solutions.    HPI The patient is here for an acute visit.   Painful and swollen left hand - it started 2-3 weeks - there was no injury.  The swelling and pain is on the posterior hand along the medial aspect of the hand - Rebekah Kennedy has some darkening of the skin along the posterior aspect of the 5th finger.     Medications and allergies reviewed with patient and updated if appropriate.  Patient Active Problem List   Diagnosis Date Noted   Left knee pain 03/28/2021   Viral pharyngitis 03/24/2021   Right foot pain 10/19/2020   Dermatitis of eyelid, contact or allergic 07/31/2018   History of CVA with residual deficit, R sided weakness 07/14/2018   Left thalamic infarction ([place]) 07/12/2018   Carotid artery disease ([place]) 07/09/2018   Right foot drop 12/17/2017   Atherosclerosis of aorta ([place]) 10/09/2017   Bariatric surgery status, 07/2017 08/19/2017   Constipation 08/19/2017   Greater trochanteric bursitis of both hips 12/19/2016   OSA (obstructive sleep apnea) 07/26/2016   Diabetes ([place]) 07/19/2015   History of colonic polyps 12/01/2014   Partial nontraumatic tear of right rotator cuff 09/21/2013   Goiter 12/10/2011   Asthma 12/10/2011   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   History of TIA (transient ischemic attack) 02/13/2011   PROTEINURIA, MILD 09/15/2009   Hyperlipidemia 12/01/2006   Hypertension 07/07/2006    Current Outpatient Medications on File Prior to Visit  Medication Sig Dispense Refill   albuterol (PROVENTIL HFA;VENTOLIN HFA) 108 (90 Base) MCG/ACT inhaler Inhale 2 puffs into the  lungs every 4 (four) hours as needed for wheezing or shortness of breath. 18 g 2   amLODipine (NORVASC) 5 MG tablet TAKE 1 TABLET BY MOUTH EVERY DAY 90 tablet 1   ammonium lactate (AMLACTIN) 12 % lotion Apply 1 application topically as needed for dry skin. 400 g 5   aspirin EC 81 MG EC tablet Take 1 tablet (81 mg total) by mouth daily. 30 tablet 0   Biotin 5000 MCG TABS Take 10,000 mcg by mouth daily.      calcium-vitamin D (OSCAL WITH D) 500-200 MG-UNIT tablet Take 1 tablet by mouth. 1200 pm daily     Cholecalciferol (VITAMIN D3 PO) Take 2,000 Units by mouth daily.     clopidogrel (PLAVIX) 75 MG tablet TAKE 1 TABLET BY MOUTH EVERY DAY 90 tablet 1   Continuous Blood Gluc Receiver (FREESTYLE LIBRE 14 DAY READER) DEVI USE AS DIRECTED TO MONITOR SUGARS. DX E11.51 (Patient taking differently: USE AS DIRECTED TO MONITOR SUGARS. DX E11.51 Left upper arm) 1 each 0   Continuous Blood Gluc Receiver (FREESTYLE LIBRE 2 READER) DEVI Use as directed to check blood sugar 1 each 1   Continuous Blood Gluc Sensor (FREESTYLE LIBRE 2 SENSOR) MISC USE AS DIRECTED EVERY 2 WEEKS TO MONITOR BLOOD SUGARS AS INSTRUCTED 2 each 3   dapagliflozin propanediol (FARXIGA) 5 MG TABS tablet Take 1 tablet (5 mg total) by mouth daily before breakfast. 90 tablet 1   glucose blood (FREESTYLE PRECISION NEO TEST) test strip Use as instructed 100 each 12  glyBURIDE (DIABETA) 5 MG tablet Take 1 tablet (5 mg total) by mouth 2 (two) times daily with a meal. 180 tablet 1   hydrALAZINE (APRESOLINE) 25 MG tablet Take 1 tablet (25 mg total) by mouth 3 (three) times daily. 270 tablet 1   losartan (COZAAR) 100 MG tablet TAKE 1 TABLET BY MOUTH EVERY DAY 90 tablet 1   metoprolol succinate (TOPROL-XL) 100 MG 24 hr tablet TAKE 1 TABLET BY MOUTH DAILY WITH OR IMMEDIATELY FOLLOWING A MEAL 90 tablet 1   montelukast (SINGULAIR) 10 MG tablet TAKE 1 TABLET BY MOUTH EVERY DAY 90 tablet 1   nystatin-triamcinolone ointment (MYCOLOG) APPLY TO AFFECTED AREA  TWICE A DAY (Patient taking differently: as needed.) 30 g 4   OVER THE COUNTER MEDICATION Take 1 tablet by mouth daily. Multivitamin centrum 1 daily     rosuvastatin (CRESTOR) 10 MG tablet TAKE 1 TABLET BY MOUTH EVERY DAY 90 tablet 3   Semaglutide (RYBELSUS) 14 MG TABS Take 14 mg by mouth daily before breakfast. 90 tablet 1   tacrolimus (PROTOPIC) 0.1 % ointment Apply topically 2 (two) times daily. (Patient taking differently: Apply topically as needed.) 30 g 2   No current facility-administered medications on file prior to visit.    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   Retinal edema    gets steriod inj in the eyes     Sleep apnea    not needed since 100 pound weight loss 2019-2020   Splenomegaly    in college   Stroke (cerebrum) ([place]) 06/2018   left side pt states, left wide weak per pt   Stroke [facility]) 2012   right pontine cva with left hemiparesis    Past Surgical History:  Procedure Laterality Date   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   APPENDECTOMY     yrs ago per pt 0n 11-29-2020   BIOPSY THYROID  05/02/2011   Nonneoplastic goiter   BREAST BIOPSY     BREAST LUMPECTOMY WITH RADIOACTIVE SEED LOCALIZATION Left 07/25/2017   Procedure: LEFT BREAST LUMPECTOMY WITH RADIOACTIVE SEED LOCALIZATION;  Surgeon: Coralie Keens, MD;  Location: [facility];   Service: General;  Laterality: Left;   BREAST SURGERY     Reduction   cataracts Bilateral 2012   COLONOSCOPY  2021   COLPOSCOPY     DILATATION & CURETTAGE/HYSTEROSCOPY WITH MYOSURE N/A 12/02/2020   Procedure: DILATATION & CURETTAGE/HYSTEROSCOPY WITH MYOSURE;  Surgeon: [name], MD;  Location: [place];  Service: Gynecology;  Laterality: N/A;   DILATION AND CURETTAGE OF UTERUS  1975   DUB   EYE SURGERY     Laser for vision at times both eyes done   GASTRIC ROUX-EN-Y N/A 08/12/2017  Procedure: LAPAROSCOPIC ROUX-EN-Y GASTRIC BYPASS WITH HIATAL HERNIA REPAIR AND UPPER ENDOSCOPY;  Surgeon: [name], [name], MD;  [facility];  Service: General;  Laterality: N/A;   GYNECOLOGIC CRYOSURGERY     MYOMECTOMY     OVARIAN CYST REMOVAL     PELVIC LAPAROSCOPY     DL lysis of adhesions 1975 and 1988    Social History   Socioeconomic History   Marital status: Single    Spouse name: Not on file   Number of children: 2   Years of education: 16   Highest education level: Bachelor's degree (e.g., BA, AB, BS)  Occupational History   Occupation: TEACHER    Employer: [employer]  Tobacco Use   Smoking status: Never   Smokeless tobacco: Never  Vaping Use   Vaping Use: Never used  Substance and Sexual Activity   Alcohol use: Not Currently    Alcohol/week: 0.0 standard drinks   Drug use: Never   Sexual activity: Not Currently    Birth control/protection: Post-menopausal  Other Topics Concern   Not on file  Social History Narrative   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   Living with daughter      Right handed      4 steps inside of the home      Social Determinants of Health   Financial Resource Strain: Not on file  Food Insecurity: Not on file  Transportation Needs: Not on file  Physical Activity: Not on file  Stress: Not on file  Social Connections: Not on file    Family  History  Problem Relation Age of Onset   Cancer Maternal Grandmother        Colon Cancer   Asthma Maternal Grandmother    Colon cancer Maternal Grandmother    Diabetes Father    Heart disease Father    Hypertension Father    Hyperlipidemia Father    Stroke Father        passed at age 108 from stroke   Hypertension Mother    Heart disease Mother    Ovarian cancer Mother    Cancer Mother        Lung cancer   Asthma Mother    COPD Mother    Hyperlipidemia Mother    Diabetes Brother    Kidney disease Brother    Hypertension Brother    Graves' disease Sister    Diabetes Sister    Breast cancer Sister 45   Graves' disease Paternal Grandmother    Hypertension Paternal Grandmother    Heart disease Paternal Grandmother    Alzheimer's disease Paternal Grandmother    Cancer Maternal Grandfather    Heart failure Brother    Prostate cancer Brother    Diabetes Brother    Asthma Brother    Hypertension Brother    Heart failure Brother    Prostate cancer Brother    Stroke Brother    Hypertension Brother     Review of Systems  Constitutional:  Negative for fever.  Musculoskeletal:        Left hand swelling  Neurological:  Negative for weakness and numbness.      Objective:   Vitals:   06/12/21 0941  BP: 130/86  Pulse: 68  Temp: 98.1 F (36.7 C)  SpO2: 98%   BP Readings from Last 3 Encounters:  06/12/21 130/86  03/28/21 134/73  02/10/21 130/74   Wt Readings from Last 3  Encounters:  06/12/21 178 lb 3.2 oz (80.8 kg)  03/27/21 187 lb 6.3 oz (85 kg)  02/10/21 174 lb (78.9 kg)   Body mass index is 30.59 kg/m.   Physical Exam Constitutional:      General: Rebekah Kennedy is not in acute distress.    Appearance: Normal appearance. Rebekah Kennedy is not ill-appearing.  HENT:     Head: Normocephalic and atraumatic.  Musculoskeletal:     Comments: Left posterior hand-fifth MCP joint is tender and slightly swollen with slight hyperpigmentation overlying joint, slight tenderness proximal  fifth finger and along fifth metacarpal.  No deformity.  Normal sensation in hand  Skin:    General: Skin is warm and dry.  Neurological:     Mental Status: Rebekah Kennedy is alert.           Assessment & Plan:    See Problem List for Assessment and Plan of chronic medical problems.

## 2021-06-12 ENCOUNTER — Ambulatory Visit (INDEPENDENT_AMBULATORY_CARE_PROVIDER_SITE_OTHER): Payer: Medicare Other | Admitting: Internal Medicine

## 2021-06-12 ENCOUNTER — Encounter: Payer: Self-pay | Admitting: Internal Medicine

## 2021-06-12 ENCOUNTER — Other Ambulatory Visit: Payer: Self-pay

## 2021-06-12 VITALS — BP 130/86 | HR 68 | Temp 98.1°F | Ht 64.0 in | Wt 178.2 lb

## 2021-06-12 DIAGNOSIS — I10 Essential (primary) hypertension: Secondary | ICD-10-CM | POA: Diagnosis not present

## 2021-06-12 DIAGNOSIS — M25542 Pain in joints of left hand: Secondary | ICD-10-CM | POA: Insufficient documentation

## 2021-06-12 NOTE — Assessment & Plan Note (Signed)
Chronic Diastolic BP borderline high here today, but systolic is good Overall blood pressure over 3 readings has been controlled-no change in medication Continue amlodipine 5 mg daily, hydralazine 25 mg 3 times daily, losartan 100 mg daily, metoprolol XL 100 mg daily

## 2021-06-12 NOTE — Assessment & Plan Note (Signed)
Acute Left fifth MCP joint pain and swelling with pain along fifth metacarpal and proximal finger No injury to unlikely a fracture ?  Flare of arthritis, pseudogout, gout, versus other Unable to take NSAIDs secondary to being on Plavix, she does ibuprofen on occasion Can take Tylenol We will hold off on steroids given her diabetes Continue to ice, apply topical arthritis medications/Voltaren Will refer to sports medicine for further evaluation

## 2021-06-12 NOTE — Patient Instructions (Addendum)
° ° °  A referral was ordered for sports med - Dr Tamala Julian  -- left 5th MCP joint pain/swelling.

## 2021-06-14 NOTE — Progress Notes (Signed)
Rebekah Kennedy [address] [place] Phone: [phone] Subjective:   IVilma Kennedy, am serving as a scribe for Dr. Hulan Saas. This visit occurred during the SARS-CoV-2 public health emergency.  Safety protocols were in place, including screening questions prior to the visit, additional usage of staff PPE, and extensive cleaning of exam room while observing appropriate contact time as indicated for disinfecting solutions.   I'm seeing this patient by the request  of:  Binnie Rail, MD  CC: Left hand pain  PQA:[id number]  Rebekah Kennedy is a 66 y.o. female coming in with complaint of pain in 5th mcp joint on left hand. Has been happening for about 3-4 weeks. Swollen but the swelling has gone down. Only hurts when touching or bending it. Will take an ibuprofen every now and again. Used Voltaren and Aspercreme.       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   Retinal edema    gets steriod inj in the eyes     Sleep apnea    not needed since 100 pound weight loss 2019-2020   Splenomegaly    in college   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;  Service: General;  Laterality: Left;   BREAST SURGERY     Reduction   cataracts Bilateral 2012   COLONOSCOPY  2021   COLPOSCOPY     DILATATION & CURETTAGE/HYSTEROSCOPY WITH MYOSURE N/A 12/02/2020   Procedure: DILATATION & CURETTAGE/HYSTEROSCOPY WITH MYOSURE;  Surgeon: [name], MD;  Location: [place];  Service: Gynecology;  Laterality: N/A;   [place]   DUB   EYE SURGERY     Laser for vision at times both eyes done   GASTRIC ROUX-EN-Y N/A 08/12/2017   Procedure: LAPAROSCOPIC ROUX-EN-Y GASTRIC BYPASS WITH HIATAL HERNIA REPAIR AND UPPER ENDOSCOPY;  Surgeon: [name], [name], MD;  Location: [facility];  Service: General;  Laterality: N/A;   GYNECOLOGIC CRYOSURGERY     MYOMECTOMY     OVARIAN CYST REMOVAL     PELVIC LAPAROSCOPY     DL lysis of  adhesions 1975 and 1988   Social History   Socioeconomic History   Marital status: Single    Spouse name: Not on file   Number of children: 2   Years of education: 16   Highest education level: Bachelor's degree (e.g., BA, AB, BS)  Occupational History   Occupation: TEACHER    Employer: [employer]  Tobacco Use   Smoking status: Never   Smokeless tobacco: Never  Vaping Use   Vaping Use: Never used  Substance and Sexual Activity   Alcohol use: Not Currently    Alcohol/week: 0.0 standard drinks   Drug use: Never   Sexual activity: Not Currently    Birth control/protection: Post-menopausal  Other Topics Concern   Not on file  Social History Narrative   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  system   Living with daughter      Right handed      4 steps inside of the home      Social Determinants of Health   Financial Resource Strain: Not on file  Food Insecurity: Not on file  Transportation Needs: Not on file  Physical Activity: Not on file  Stress: Not on file  Social Connections: Not on file   Allergies  Allergen Reactions   Quinapril Cough   Metformin And Related     Loose stool   Family History  Problem Relation Age of Onset   Cancer Maternal Grandmother        Colon Cancer   Asthma Maternal Grandmother    Colon cancer Maternal Grandmother    Diabetes Father    Heart disease Father    Hypertension Father    Hyperlipidemia Father    Stroke Father        passed at age 3 from stroke   Hypertension Mother    Heart disease Mother    Ovarian cancer Mother    Cancer Mother        Lung cancer   Asthma Mother    COPD Mother    Hyperlipidemia Mother    Diabetes Brother    Kidney disease Brother    Hypertension Brother    Graves' disease Sister    Diabetes Sister    Breast cancer Sister 70   Graves' disease Paternal Grandmother    Hypertension Paternal Grandmother    Heart disease Paternal Grandmother    Alzheimer's disease Paternal Grandmother    Cancer Maternal Grandfather    Heart failure Brother    Prostate cancer Brother    Diabetes Brother    Asthma Brother    Hypertension Brother    Heart failure Brother    Prostate cancer Brother    Stroke Brother    Hypertension Brother     Current Outpatient Medications (Endocrine & Metabolic):    dapagliflozin propanediol (FARXIGA) 5 MG TABS tablet, Take 1 tablet (5 mg total) by mouth daily before breakfast.   glyBURIDE (DIABETA) 5 MG tablet, Take 1 tablet (5 mg total) by mouth 2 (two) times daily with a meal.   Semaglutide (RYBELSUS) 14 MG TABS, Take 14 mg by mouth daily before breakfast.  Current Outpatient Medications (Cardiovascular):    amLODipine (NORVASC) 5 MG tablet, TAKE 1 TABLET BY  MOUTH EVERY DAY   hydrALAZINE (APRESOLINE) 25 MG tablet, Take 1 tablet (25 mg total) by mouth 3 (three) times daily.   losartan (COZAAR) 100 MG tablet, TAKE 1 TABLET BY MOUTH EVERY DAY  metoprolol succinate (TOPROL-XL) 100 MG 24 hr tablet, TAKE 1 TABLET BY MOUTH DAILY WITH OR IMMEDIATELY FOLLOWING A MEAL   rosuvastatin (CRESTOR) 10 MG tablet, TAKE 1 TABLET BY MOUTH EVERY DAY  Current Outpatient Medications (Respiratory):    albuterol (PROVENTIL HFA;VENTOLIN HFA) 108 (90 Base) MCG/ACT inhaler, Inhale 2 puffs into the lungs every 4 (four) hours as needed for wheezing or shortness of breath.   montelukast (SINGULAIR) 10 MG tablet, TAKE 1 TABLET BY MOUTH EVERY DAY  Current Outpatient Medications (Analgesics):    aspirin EC 81 MG EC tablet, Take 1 tablet (81 mg total) by mouth daily.  Current Outpatient Medications (Hematological):    clopidogrel (PLAVIX) 75 MG tablet, TAKE 1 TABLET BY MOUTH EVERY DAY  Current Outpatient Medications (Other):    doxycycline (VIBRA-TABS) 100 MG tablet, Take 1 tablet (100 mg total) by mouth 2 (two) times daily.   ammonium lactate (AMLACTIN) 12 % lotion, Apply 1 application topically as needed for dry skin.   Biotin 5000 MCG TABS, Take 10,000 mcg by mouth daily.    calcium-vitamin D (OSCAL WITH D) 500-200 MG-UNIT tablet, Take 1 tablet by mouth. 1200 pm daily   Cholecalciferol (VITAMIN D3 PO), Take 2,000 Units by mouth daily.   Continuous Blood Gluc Receiver (FREESTYLE LIBRE 14 DAY READER) DEVI, USE AS DIRECTED TO MONITOR SUGARS. DX E11.51 (Patient taking differently: USE AS DIRECTED TO MONITOR SUGARS. DX E11.51 Left upper arm)   Continuous Blood Gluc Receiver (FREESTYLE LIBRE 2 READER) DEVI, Use as directed to check blood sugar   Continuous Blood Gluc Sensor (FREESTYLE LIBRE 2 SENSOR) MISC, USE AS DIRECTED EVERY 2 WEEKS TO MONITOR BLOOD SUGARS AS INSTRUCTED   glucose blood (FREESTYLE PRECISION NEO TEST) test strip, Use as instructed   nystatin-triamcinolone  ointment (MYCOLOG), APPLY TO AFFECTED AREA TWICE A DAY (Patient taking differently: as needed.)   OVER THE COUNTER MEDICATION, Take 1 tablet by mouth daily. Multivitamin centrum 1 daily   tacrolimus (PROTOPIC) 0.1 % ointment, Apply topically 2 (two) times daily. (Patient taking differently: Apply topically as needed.)   Reviewed prior external information including notes and imaging from  primary care provider As well as notes that were available from care everywhere and other healthcare systems.  Past medical history, social, surgical and family history all reviewed in electronic medical record.  No pertanent information unless stated regarding to the chief complaint.   Review of Systems:  No headache, visual changes, nausea, vomiting, diarrhea, constipation, dizziness, abdominal pain, skin rash, fevers, chills, night sweats, weight loss, swollen lymph nodes, body aches, joint swelling, chest pain, shortness of breath, mood changes. POSITIVE muscle aches  Objective  Blood pressure 122/82, pulse 82, height 5\' 4"  (1.626 m), weight 178 lb (80.7 kg), SpO2 97 %.   General: No apparent distress alert and oriented x3 mood and affect normal, dressed appropriately.  HEENT: Pupils equal, extraocular movements intact  Respiratory: Patient's speak in full sentences and does not appear short of breath  Cardiovascular: No lower extremity edema, non tender, no erythema  Gait antalgic walking with the aid of a cane. MSK: Left hand exam does show some swelling over the fifth MCP joint. Patient is tender to palpation just proximal to this area.  Patient does not have full range of motion.  Neurovascularly intact distally.  Limited muscular skeletal ultrasound was performed and interpreted by Hulan Saas, M  Limited ultrasound does not show any true cortical defect.  Patient does have in the area of the swelling is more of  a soft tissue hypoechoic changes.  No true loculation noted.  Patient does have  increased Doppler flow surrounding the area.  No blood flow in the area.  Does seem to be involving potentially of the small amount of the tendon as well with hypoechoic changes within the tendon sheath.   Impression and Recommendations:    The above documentation has been reviewed and is accurate and complete Lyndal Pulley, DO

## 2021-06-15 ENCOUNTER — Ambulatory Visit: Payer: Self-pay

## 2021-06-15 ENCOUNTER — Ambulatory Visit: Payer: Medicare Other | Admitting: Family Medicine

## 2021-06-15 ENCOUNTER — Other Ambulatory Visit: Payer: Self-pay

## 2021-06-15 ENCOUNTER — Ambulatory Visit (INDEPENDENT_AMBULATORY_CARE_PROVIDER_SITE_OTHER): Payer: Medicare Other

## 2021-06-15 ENCOUNTER — Encounter: Payer: Self-pay | Admitting: Family Medicine

## 2021-06-15 VITALS — BP 122/82 | HR 82 | Ht 64.0 in | Wt 178.0 lb

## 2021-06-15 DIAGNOSIS — M79642 Pain in left hand: Secondary | ICD-10-CM | POA: Diagnosis not present

## 2021-06-15 DIAGNOSIS — M25542 Pain in joints of left hand: Secondary | ICD-10-CM

## 2021-06-15 MED ORDER — DOXYCYCLINE HYCLATE 100 MG PO TABS: 100.0000 mg | ORAL_TABLET | Freq: Two times a day (BID) | ORAL | 0 refills | Status: DC

## 2021-06-15 NOTE — Patient Instructions (Addendum)
Doxycycline 2x a day for 10 days Baby aspirin If hurting buddy tape fingers Ice and Voltaren See you again in 1-14 days If gets a lot worse call us or seek medical attention

## 2021-06-15 NOTE — Assessment & Plan Note (Signed)
Patient's finding on ultrasound is consistent with more of a possible soft tissue swelling and possible infection etiology.  Patient has not had any true injury at the moment.  Patient does state that it is slowly getting better.  We discussed with patient that at this point with her having a couple dogs and potentially a scratch and I would like her to take some medication.  Due to this being greater than 2 weeks we will do doxycycline to cover for more MRSA than the Augmentin.  Patient is also on Plavix so we will avoid oral anti-inflammatories but did discuss topical.  X-rays are pending.  We will follow-up with me again in 10 to 14 days.

## 2021-06-27 ENCOUNTER — Encounter: Payer: Self-pay | Admitting: Internal Medicine

## 2021-06-27 ENCOUNTER — Telehealth: Payer: Self-pay | Admitting: Internal Medicine

## 2021-06-27 DIAGNOSIS — R2689 Other abnormalities of gait and mobility: Secondary | ICD-10-CM

## 2021-06-27 DIAGNOSIS — R531 Weakness: Secondary | ICD-10-CM

## 2021-06-27 DIAGNOSIS — G629 Polyneuropathy, unspecified: Secondary | ICD-10-CM

## 2021-06-27 NOTE — Telephone Encounter (Signed)
Pt states she tested covid + on 2-7, pt states she has mild symptoms and has been taking otc cold medication which helps  Pt states she is concern due to her pre existing conditions and is requesting a call back w/ provider's recommendations if she needs antivirals or not  Please call pt

## 2021-06-28 NOTE — Telephone Encounter (Signed)
Spoke with patient today. She will treat with her symptoms with OTC meds and call for virtual visit if symptoms worsen.

## 2021-06-29 ENCOUNTER — Ambulatory Visit: Payer: Medicare Other | Admitting: Family Medicine

## 2021-07-03 NOTE — Telephone Encounter (Signed)
Neuro last referred her for PT - what does she want to see them for  - the same thing as prior?  H/o stroke and neuropathy?

## 2021-07-03 NOTE — Telephone Encounter (Signed)
Pt states she is still testing positive as of 2-13, pt states she does not have any symptoms  Pt requesting a c/b for recommendations if she should continue quarantining

## 2021-07-06 ENCOUNTER — Other Ambulatory Visit: Payer: Self-pay | Admitting: Internal Medicine

## 2021-07-06 NOTE — Addendum Note (Signed)
Addended by: Binnie Rail on: 07/06/2021 02:15 PM   Modules accepted: Orders

## 2021-07-20 ENCOUNTER — Ambulatory Visit: Payer: Medicare Other | Admitting: Family Medicine

## 2021-07-20 NOTE — Progress Notes (Signed)
? ? ?Subjective:  ? ? Patient ID: Rebekah Kennedy, female    DOB: 1955/06/24, 66 y.o.   MRN: [id number] ? ?This visit occurred during the SARS-CoV-2 public health emergency.  Safety protocols were in place, including screening questions prior to the visit, additional usage of staff PPE, and extensive cleaning of exam room while observing appropriate contact time as indicated for disinfecting solutions. ? ? ? ?HPI ?Rebekah Kennedy is here for  ?Chief Complaint  ?Patient presents with  ? Leg Swelling  ?  Bilateral leg swelling  ? ? ?B/l leg swelling - started a few weeks ago. Left leg is bigger than right.  She has a couple of ulcerations both legs - ? Scratches at night.  It does not itch during the day.  ? ?Swelling not better overnight.  Not worse as day goes on.  She does sleep in the chair a lot with her legs.  ? ? ?Echocardiogram 06/2018-normal EF, mild LVH.  Diastolic function indeterminate.  No significant valve disease. ? ? ?Medications and allergies reviewed with patient and updated if appropriate. ? ?Current Outpatient Medications on File Prior to Visit  ?Medication Sig Dispense Refill  ? albuterol (PROVENTIL HFA;VENTOLIN HFA) 108 (90 Base) MCG/ACT inhaler Inhale 2 puffs into the lungs every 4 (four) hours as needed for wheezing or shortness of breath. 18 g 2  ? amLODipine (NORVASC) 5 MG tablet TAKE 1 TABLET BY MOUTH EVERY DAY 90 tablet 1  ? ammonium lactate (AMLACTIN) 12 % lotion Apply 1 application topically as needed for dry skin. 400 g 5  ? aspirin EC 81 MG EC tablet Take 1 tablet (81 mg total) by mouth daily. 30 tablet 0  ? Biotin 5000 MCG TABS Take 10,000 mcg by mouth daily.     ? calcium-vitamin D (OSCAL WITH D) 500-200 MG-UNIT tablet Take 1 tablet by mouth. 1200 pm daily    ? Cholecalciferol (VITAMIN D3 PO) Take 2,000 Units by mouth daily.    ? clopidogrel (PLAVIX) 75 MG tablet TAKE 1 TABLET BY MOUTH EVERY DAY 90 tablet 1  ? Continuous Blood Gluc Receiver (FREESTYLE LIBRE 14 DAY READER) DEVI USE AS DIRECTED TO  MONITOR SUGARS. DX E11.51 (Patient taking differently: USE AS DIRECTED TO MONITOR SUGARS. DX E11.51 ?Left upper arm) 1 each 0  ? Continuous Blood Gluc Receiver (FREESTYLE LIBRE 2 READER) DEVI Use as directed to check blood sugar 1 each 1  ? Continuous Blood Gluc Sensor (FREESTYLE LIBRE 2 SENSOR) MISC USE AS DIRECTED EVERY 2 WEEKS TO MONITOR BLOOD SUGARS AS INSTRUCTED 2 each 3  ? dapagliflozin propanediol (FARXIGA) 5 MG TABS tablet Take 1 tablet (5 mg total) by mouth daily before breakfast. 90 tablet 1  ? glucose blood (FREESTYLE PRECISION NEO TEST) test strip Use as instructed 100 each 12  ? hydrALAZINE (APRESOLINE) 25 MG tablet Take 1 tablet (25 mg total) by mouth 3 (three) times daily. 270 tablet 1  ? losartan (COZAAR) 100 MG tablet TAKE 1 TABLET BY MOUTH EVERY DAY 90 tablet 1  ? metoprolol succinate (TOPROL-XL) 100 MG 24 hr tablet TAKE 1 TABLET BY MOUTH DAILY WITH OR IMMEDIATELY FOLLOWING A MEAL 90 tablet 1  ? montelukast (SINGULAIR) 10 MG tablet TAKE 1 TABLET BY MOUTH EVERY DAY 90 tablet 1  ? nystatin-triamcinolone ointment (MYCOLOG) APPLY TO AFFECTED AREA TWICE A DAY (Patient taking differently: as needed.) 30 g 4  ? OVER THE COUNTER MEDICATION Take 1 tablet by mouth daily. Multivitamin centrum 1 daily    ?  rosuvastatin (CRESTOR) 10 MG tablet TAKE 1 TABLET BY MOUTH EVERY DAY 90 tablet 3  ? Semaglutide (RYBELSUS) 14 MG TABS Take 14 mg by mouth daily before breakfast. 90 tablet 1  ? tacrolimus (PROTOPIC) 0.1 % ointment Apply topically 2 (two) times daily. (Patient taking differently: Apply topically as needed.) 30 g 2  ? [facility] COVID-19 AT HOME TEST KIT KIT See admin instructions. see package    ? ?No current facility-administered medications on file prior to visit.  ? ? ?Review of Systems  ?Constitutional:  Negative for chills and fever.  ?Respiratory:  Negative for cough, shortness of breath and wheezing.   ?Cardiovascular:  Positive for leg swelling. Negative for chest pain and palpitations.  ?Neurological:   Negative for light-headedness and headaches.  ? ?   ?Objective:  ? ?Vitals:  ? 07/21/21 0849  ?BP: 140/70  ?Pulse: 82  ?Temp: 98.2 ?F (36.8 ?C)  ?SpO2: 97%  ? ?BP Readings from Last 3 Encounters:  ?07/21/21 140/70  ?06/15/21 122/82  ?06/12/21 130/86  ? ?Wt Readings from Last 3 Encounters:  ?07/21/21 175 lb 3.2 oz (79.5 kg)  ?06/15/21 178 lb (80.7 kg)  ?06/12/21 178 lb 3.2 oz (80.8 kg)  ? ?Body mass index is 30.07 kg/m?. ? ?  ?Physical Exam ?Constitutional:   ?   General: She is not in acute distress. ?   Appearance: Normal appearance.  ?HENT:  ?   Head: Normocephalic and atraumatic.  ?Eyes:  ?   Conjunctiva/sclera: Conjunctivae normal.  ?Cardiovascular:  ?   Rate and Rhythm: Normal rate and regular rhythm.  ?   Heart sounds: Normal heart sounds. No murmur heard. ?Pulmonary:  ?   Effort: Pulmonary effort is normal. No respiratory distress.  ?   Breath sounds: Normal breath sounds. No wheezing.  ?Musculoskeletal:  ?   Cervical back: Neck supple.  ?   Right lower leg: Edema (1+pitting) present.  ?   Left lower leg: Edema (1+pitting) present.  ?Lymphadenopathy:  ?   Cervical: No cervical adenopathy.  ?Skin: ?   Findings: No rash.  ?Neurological:  ?   Mental Status: She is alert. Mental status is at baseline.  ?Psychiatric:     ?   Mood and Affect: Mood normal.     ?   Behavior: Behavior normal.  ? ?   ? ? ? ?Diabetic Foot Exam - Simple   ?Simple Foot Form ?Diabetic Foot exam was performed with the following findings: Yes 07/21/2021 12:51 PM  ?Visual Inspection ?No deformities, no ulcerations, no other skin breakdown bilaterally: Yes ?Sensation Testing ?Intact to touch and monofilament testing bilaterally: Yes ?Pulse Check ?Posterior Tibialis and Dorsalis pulse intact bilaterally: Yes ?Comments ?  ? ? ? ?Assessment & Plan:  ? ? ?See Problem List for Assessment and Plan of chronic medical problems.  ? ? ? ? ?

## 2021-07-21 ENCOUNTER — Encounter: Payer: Self-pay | Admitting: Internal Medicine

## 2021-07-21 ENCOUNTER — Other Ambulatory Visit: Payer: Self-pay

## 2021-07-21 ENCOUNTER — Ambulatory Visit (INDEPENDENT_AMBULATORY_CARE_PROVIDER_SITE_OTHER): Payer: Medicare Other | Admitting: Internal Medicine

## 2021-07-21 VITALS — BP 140/70 | HR 82 | Temp 98.2°F | Ht 64.0 in | Wt 175.2 lb

## 2021-07-21 DIAGNOSIS — E569 Vitamin deficiency, unspecified: Secondary | ICD-10-CM | POA: Diagnosis not present

## 2021-07-21 DIAGNOSIS — K912 Postsurgical malabsorption, not elsewhere classified: Secondary | ICD-10-CM | POA: Diagnosis not present

## 2021-07-21 DIAGNOSIS — L03116 Cellulitis of left lower limb: Secondary | ICD-10-CM | POA: Diagnosis not present

## 2021-07-21 DIAGNOSIS — I10 Essential (primary) hypertension: Secondary | ICD-10-CM

## 2021-07-21 DIAGNOSIS — R600 Localized edema: Secondary | ICD-10-CM | POA: Diagnosis not present

## 2021-07-21 DIAGNOSIS — Z9884 Bariatric surgery status: Secondary | ICD-10-CM | POA: Diagnosis not present

## 2021-07-21 DIAGNOSIS — E1142 Type 2 diabetes mellitus with diabetic polyneuropathy: Secondary | ICD-10-CM | POA: Diagnosis not present

## 2021-07-21 MED ORDER — DOXYCYCLINE HYCLATE 100 MG PO TABS: 100.0000 mg | ORAL_TABLET | Freq: Two times a day (BID) | ORAL | 0 refills | Status: AC

## 2021-07-21 MED ORDER — HYDRALAZINE HCL 25 MG PO TABS: 25.0000 mg | ORAL_TABLET | Freq: Two times a day (BID) | ORAL | 1 refills | Status: DC

## 2021-07-21 NOTE — Assessment & Plan Note (Signed)
Chronic ?Blood pressure adequately controlled here today ?She thinks she is only taking the hydralazine twice a day-okay to continue twice daily since her blood pressure is controlled-we will need to monitor ?Continue amlodipine 5 mg daily, losartan 100 mg daily, metoprolol XL 100 mg daily ?

## 2021-07-21 NOTE — Patient Instructions (Addendum)
? ? ? ? ? ?  Medications changes include :  stop glyburide.   Start doxycycline for your leg infection ? ? ?Your prescription(s) have been sent to your pharmacy.  ? ? ?A leg ultrasound was ordered.       Someone will call you to schedule an appointment.  ? ? ?Return if symptoms worsen or fail to improve. ? ?

## 2021-07-21 NOTE — Assessment & Plan Note (Signed)
Acute ?Started a few weeks ago ?No SOB or symptom c/w CHF ?No change in meds ?She has been sleeping in the chair overnight with her legs down which is new and that may be contributing. ?She is a couple scratch marks on both legs, one area on her left leg does look possibly infected which could be making the swelling in that leg worse ?We will get bilateral leg ultrasounds to rule out DVT, which I think is less likely-she cannot get this today and will go on Monday-since this has been going on for few weeks I think that is okay ?Will avoid diuretic for now hopefully which she would agree with ?Stressed that she needs to elevate her legs when sitting and sleeping ?Can consider compression socks ?She has follow-up with me later this month and we will reevaluate ?

## 2021-07-21 NOTE — Assessment & Plan Note (Signed)
Acute ?Bilateral lower extremity swelling in a couple of lesions on both legs which she thinks is from her scratching at night, but her legs do not itch during the day ?She has 1 lesion in particular on the left medial lower leg that has surrounding erythema and is tender-concern regarding possible cellulitis ?No fluctuance or abscess ?Start doxycycline 100 mg twice daily x10 days ?Has a follow-up later this month with me so we can reevaluate ? ?

## 2021-07-24 ENCOUNTER — Ambulatory Visit (HOSPITAL_COMMUNITY)
Admission: RE | Admit: 2021-07-24 | Discharge: 2021-07-24 | Disposition: A | Discharge status: Home / Self Care | Payer: Medicare Other | Source: Ambulatory Visit | Attending: Cardiology | Admitting: Cardiology

## 2021-07-24 ENCOUNTER — Other Ambulatory Visit: Payer: Self-pay

## 2021-07-24 DIAGNOSIS — R600 Localized edema: Secondary | ICD-10-CM | POA: Insufficient documentation

## 2021-07-31 DIAGNOSIS — H35033 Hypertensive retinopathy, bilateral: Secondary | ICD-10-CM | POA: Diagnosis not present

## 2021-07-31 DIAGNOSIS — E113592 Type 2 diabetes mellitus with proliferative diabetic retinopathy without macular edema, left eye: Secondary | ICD-10-CM | POA: Diagnosis not present

## 2021-07-31 DIAGNOSIS — H43813 Vitreous degeneration, bilateral: Secondary | ICD-10-CM | POA: Diagnosis not present

## 2021-07-31 DIAGNOSIS — E113411 Type 2 diabetes mellitus with severe nonproliferative diabetic retinopathy with macular edema, right eye: Secondary | ICD-10-CM | POA: Diagnosis not present

## 2021-08-09 ENCOUNTER — Encounter: Payer: Self-pay | Admitting: Internal Medicine

## 2021-08-09 NOTE — Patient Instructions (Addendum)
? ? ? ?  Blood work was ordered.   ? ? ?Medications changes include :   None ? ? ? ? ? ? ?Return in about 6 months (around 02/10/2022) for follow up. ? ?

## 2021-08-09 NOTE — Progress Notes (Signed)
? ? ? ? ?Subjective:  ? ? Patient ID: Rebekah Kennedy, female    DOB: April 14, 1956, 66 y.o.   MRN: [id number] ? ?This visit occurred during the SARS-CoV-2 public health emergency.  Safety protocols were in place, including screening questions prior to the visit, additional usage of staff PPE, and extensive cleaning of exam room while observing appropriate contact time as indicated for disinfecting solutions.   ? ? ?HPI ?Rebekah Kennedy is here for follow up of her chronic medical problems, including htn, hld, h/o cva, DM, asthma, obesity ? ?She is trying to eat better.  She s trying to eat more plant based.  She just saw her bariatric doctor - needs some vitamin levels checked.  ? ?No concerns.  ? ?Medications and allergies reviewed with patient and updated if appropriate. ? ?Current Outpatient Medications on File Prior to Visit  ?Medication Sig Dispense Refill  ? albuterol (PROVENTIL HFA;VENTOLIN HFA) 108 (90 Base) MCG/ACT inhaler Inhale 2 puffs into the lungs every 4 (four) hours as needed for wheezing or shortness of breath. 18 g 2  ? amLODipine (NORVASC) 5 MG tablet TAKE 1 TABLET BY MOUTH EVERY DAY 90 tablet 1  ? ammonium lactate (AMLACTIN) 12 % lotion Apply 1 application topically as needed for dry skin. 400 g 5  ? aspirin EC 81 MG EC tablet Take 1 tablet (81 mg total) by mouth daily. 30 tablet 0  ? Biotin 5000 MCG TABS Take 10,000 mcg by mouth daily.     ? calcium-vitamin D (OSCAL WITH D) 500-200 MG-UNIT tablet Take 1 tablet by mouth. 1200 pm daily    ? Cholecalciferol (VITAMIN D3 PO) Take 2,000 Units by mouth daily.    ? clopidogrel (PLAVIX) 75 MG tablet TAKE 1 TABLET BY MOUTH EVERY DAY 90 tablet 1  ? Continuous Blood Gluc Receiver (FREESTYLE LIBRE 14 DAY READER) DEVI USE AS DIRECTED TO MONITOR SUGARS. DX E11.51 (Patient taking differently: USE AS DIRECTED TO MONITOR SUGARS. DX E11.51 ?Left upper arm) 1 each 0  ? Continuous Blood Gluc Receiver (FREESTYLE LIBRE 2 READER) DEVI Use as directed to check blood sugar 1 each 1   ? Continuous Blood Gluc Sensor (FREESTYLE LIBRE 2 SENSOR) MISC USE AS DIRECTED EVERY 2 WEEKS TO MONITOR BLOOD SUGARS AS INSTRUCTED 2 each 3  ? glucose blood (FREESTYLE PRECISION NEO TEST) test strip Use as instructed 100 each 12  ? hydrALAZINE (APRESOLINE) 25 MG tablet Take 1 tablet (25 mg total) by mouth 2 (two) times daily. 180 tablet 1  ? losartan (COZAAR) 100 MG tablet TAKE 1 TABLET BY MOUTH EVERY DAY 90 tablet 1  ? metoprolol succinate (TOPROL-XL) 100 MG 24 hr tablet TAKE 1 TABLET BY MOUTH DAILY WITH OR IMMEDIATELY FOLLOWING A MEAL 90 tablet 1  ? montelukast (SINGULAIR) 10 MG tablet TAKE 1 TABLET BY MOUTH EVERY DAY 90 tablet 1  ? nystatin-triamcinolone ointment (MYCOLOG) APPLY TO AFFECTED AREA TWICE A DAY (Patient taking differently: as needed.) 30 g 4  ? OVER THE COUNTER MEDICATION Take 1 tablet by mouth daily. Multivitamin centrum 1 daily    ? rosuvastatin (CRESTOR) 10 MG tablet TAKE 1 TABLET BY MOUTH EVERY DAY 90 tablet 3  ? tacrolimus (PROTOPIC) 0.1 % ointment Apply topically 2 (two) times daily. (Patient taking differently: Apply topically as needed.) 30 g 2  ? ?No current facility-administered medications on file prior to visit.  ? ? ? ?Review of Systems  ?Constitutional:  Negative for chills and fever.  ?Respiratory:  Negative for cough, shortness  of breath and wheezing.   ?Cardiovascular:  Positive for leg swelling. Negative for chest pain and palpitations.  ?Neurological:  Negative for light-headedness and headaches.  ? ?   ?Objective:  ? ?Vitals:  ? 08/10/21 1006  ?BP: 140/70  ?Pulse: 67  ?Temp: 98.1 ?F (36.7 ?C)  ?SpO2: 97%  ? ?BP Readings from Last 3 Encounters:  ?08/10/21 140/70  ?07/21/21 140/70  ?06/15/21 122/82  ? ?Wt Readings from Last 3 Encounters:  ?08/10/21 175 lb 6.4 oz (79.6 kg)  ?07/21/21 175 lb 3.2 oz (79.5 kg)  ?06/15/21 178 lb (80.7 kg)  ? ?Body mass index is 30.11 kg/m?. ? ?  ?Physical Exam ?Constitutional:   ?   General: She is not in acute distress. ?   Appearance: Normal  appearance.  ?HENT:  ?   Head: Normocephalic and atraumatic.  ?Eyes:  ?   Conjunctiva/sclera: Conjunctivae normal.  ?Cardiovascular:  ?   Rate and Rhythm: Normal rate and regular rhythm.  ?   Heart sounds: Normal heart sounds. No murmur heard. ?Pulmonary:  ?   Effort: Pulmonary effort is normal. No respiratory distress.  ?   Breath sounds: Normal breath sounds. No wheezing.  ?Musculoskeletal:  ?   Cervical back: Neck supple.  ?   Right lower leg: Edema (mild) present.  ?   Left lower leg: Edema (mild') present.  ?Lymphadenopathy:  ?   Cervical: No cervical adenopathy.  ?Skin: ?   Findings: No rash.  ?Neurological:  ?   Mental Status: She is alert. Mental status is at baseline.  ?Psychiatric:     ?   Mood and Affect: Mood normal.     ?   Behavior: Behavior normal.  ? ?   ? ?Lab Results  ?Component Value Date  ? WBC 9.0 12/02/2020  ? HGB 14.0 12/02/2020  ? HCT 43.9 12/02/2020  ? PLT 292 12/02/2020  ? GLUCOSE 134 (H) 02/10/2021  ? CHOL 171 02/10/2021  ? TRIG 118.0 02/10/2021  ? HDL 72.30 02/10/2021  ? LDLDIRECT 126.7 03/24/2013  ? [place] 75 02/10/2021  ? ALT 23 02/10/2021  ? AST 18 02/10/2021  ? NA 141 02/10/2021  ? K 4.7 02/10/2021  ? CL 104 02/10/2021  ? CREATININE 0.71 02/10/2021  ? BUN 17 02/10/2021  ? CO2 31 02/10/2021  ? TSH 1.19 04/24/2019  ? INR 0.96 07/07/2018  ? HGBA1C 7.4 (H) 02/10/2021  ? MICROALBUR 14.5 (H) 09/16/2013  ? ? ? ?Assessment & Plan:  ? ? ?See Problem List for Assessment and Plan of chronic medical problems.  ? ? ?

## 2021-08-10 ENCOUNTER — Ambulatory Visit (INDEPENDENT_AMBULATORY_CARE_PROVIDER_SITE_OTHER): Payer: Medicare Other | Admitting: Internal Medicine

## 2021-08-10 ENCOUNTER — Other Ambulatory Visit: Payer: Self-pay

## 2021-08-10 ENCOUNTER — Ambulatory Visit: Payer: Medicare Other | Admitting: Internal Medicine

## 2021-08-10 VITALS — BP 140/70 | HR 67 | Temp 98.1°F | Ht 64.0 in | Wt 175.4 lb

## 2021-08-10 DIAGNOSIS — I10 Essential (primary) hypertension: Secondary | ICD-10-CM | POA: Diagnosis not present

## 2021-08-10 DIAGNOSIS — Z9884 Bariatric surgery status: Secondary | ICD-10-CM

## 2021-08-10 DIAGNOSIS — E1151 Type 2 diabetes mellitus with diabetic peripheral angiopathy without gangrene: Secondary | ICD-10-CM

## 2021-08-10 DIAGNOSIS — E7849 Other hyperlipidemia: Secondary | ICD-10-CM | POA: Diagnosis not present

## 2021-08-10 DIAGNOSIS — K909 Intestinal malabsorption, unspecified: Secondary | ICD-10-CM | POA: Diagnosis not present

## 2021-08-10 DIAGNOSIS — I6930 Unspecified sequelae of cerebral infarction: Secondary | ICD-10-CM | POA: Diagnosis not present

## 2021-08-10 DIAGNOSIS — R600 Localized edema: Secondary | ICD-10-CM

## 2021-08-10 DIAGNOSIS — J4520 Mild intermittent asthma, uncomplicated: Secondary | ICD-10-CM

## 2021-08-10 LAB — LIPID PANEL
Cholesterol: 174 mg/dL (ref 0–200)
HDL: 63.9 mg/dL (ref >39.00)
LDL Cholesterol: 81 mg/dL (ref 0–99)
NonHDL: 109.93
Total CHOL/HDL Ratio: 3
Triglycerides: 145 mg/dL (ref 0.0–149.0)
VLDL: 29 mg/dL (ref 0.0–40.0)

## 2021-08-10 LAB — COMPREHENSIVE METABOLIC PANEL
ALT: 21 U/L (ref 0–35)
AST: 16 U/L (ref 0–37)
Albumin: 4 g/dL (ref 3.5–5.2)
Alkaline Phosphatase: 89 U/L (ref 39–117)
BUN: 16 mg/dL (ref 6–23)
CO2: 29 mEq/L (ref 19–32)
Calcium: 9.5 mg/dL (ref 8.4–10.5)
Chloride: 104 mEq/L (ref 96–112)
Creatinine, Ser: 0.71 mg/dL (ref 0.40–1.20)
GFR: 89.49 mL/min (ref >60.00)
Glucose, Bld: 194 mg/dL — ABNORMAL HIGH (ref 70–99)
Potassium: 4 mEq/L (ref 3.5–5.1)
Sodium: 141 mEq/L (ref 135–145)
Total Bilirubin: 0.6 mg/dL (ref 0.2–1.2)
Total Protein: 7 g/dL (ref 6.0–8.3)

## 2021-08-10 LAB — CBC WITH DIFFERENTIAL/PLATELET
Basophils Absolute: 0.1 10*3/uL (ref 0.0–0.1)
Basophils Relative: 1.1 % (ref 0.0–3.0)
Eosinophils Absolute: 0.3 10*3/uL (ref 0.0–0.7)
Eosinophils Relative: 4.5 % (ref 0.0–5.0)
HCT: 42.2 % (ref 36.0–46.0)
Hemoglobin: 13.6 g/dL (ref 12.0–15.0)
Lymphocytes Relative: 27.7 % (ref 12.0–46.0)
Lymphs Abs: 2.1 10*3/uL (ref 0.7–4.0)
MCHC: 32.3 g/dL (ref 30.0–36.0)
MCV: 83.8 fl (ref 78.0–100.0)
Monocytes Absolute: 0.4 10*3/uL (ref 0.1–1.0)
Monocytes Relative: 5.5 % (ref 3.0–12.0)
Neutro Abs: 4.5 10*3/uL (ref 1.4–7.7)
Neutrophils Relative %: 61.2 % (ref 43.0–77.0)
Platelets: 254 10*3/uL (ref 150.0–400.0)
RBC: 5.04 Mil/uL (ref 3.87–5.11)
RDW: 14.2 % (ref 11.5–15.5)
WBC: 7.4 10*3/uL (ref 4.0–10.5)

## 2021-08-10 LAB — MICROALBUMIN / CREATININE URINE RATIO
Creatinine,U: 47.1 mg/dL
Microalb Creat Ratio: 30.1 mg/g — ABNORMAL HIGH (ref 0.0–30.0)
Microalb, Ur: 14.2 mg/dL — ABNORMAL HIGH (ref 0.0–1.9)

## 2021-08-10 LAB — VITAMIN B12: Vitamin B-12: 439 pg/mL (ref 211–911)

## 2021-08-10 LAB — TSH: TSH: 1.28 u[IU]/mL (ref 0.35–5.50)

## 2021-08-10 LAB — IBC PANEL
Iron: 68 ug/dL (ref 42–145)
Saturation Ratios: 19.6 % — ABNORMAL LOW (ref 20.0–50.0)
TIBC: 347.2 ug/dL (ref 250.0–450.0)
Transferrin: 248 mg/dL (ref 212.0–360.0)

## 2021-08-10 LAB — VITAMIN D 25 HYDROXY (VIT D DEFICIENCY, FRACTURES): VITD: 34.21 ng/mL (ref 30.00–100.00)

## 2021-08-10 LAB — HEMOGLOBIN A1C: Hgb A1c MFr Bld: 8.8 % — ABNORMAL HIGH (ref 4.6–6.5)

## 2021-08-10 MED ORDER — RYBELSUS 14 MG PO TABS: 14.0000 mg | ORAL_TABLET | Freq: Every day | ORAL | 1 refills | Status: DC

## 2021-08-10 MED ORDER — DAPAGLIFLOZIN PROPANEDIOL 5 MG PO TABS: 5.0000 mg | ORAL_TABLET | Freq: Every day | ORAL | 1 refills | Status: DC

## 2021-08-10 NOTE — Assessment & Plan Note (Signed)
Subacute ?Mild b/l ?Advised elevating legs more during the day ?Was sleeping in a chair at night - stressed elevating legs overnight ?Stressed low sodium diet ?? Amlodipine or hydralazine contributing ?No change in meds ?monitor ?

## 2021-08-10 NOTE — Assessment & Plan Note (Addendum)
Chronic ?Mild, intermittent ?Controlled ?Continue Singulair 10 mg nightly and albuterol inhaler as needed ( uses usually on damp days) ?

## 2021-08-10 NOTE — Assessment & Plan Note (Signed)
S/p gastric bypass 2019 ?Check vitamin levels - B1, B12, vitamin D, iron levels, tsh, cbc, cmp ?

## 2021-08-10 NOTE — Assessment & Plan Note (Signed)
Chronic Regular exercise and healthy diet encouraged Check lipid panel  Continue Crestor 10 mg daily 

## 2021-08-10 NOTE — Assessment & Plan Note (Addendum)
Chronic ?Blood pressure adequately controlled ?CMP ?Continue amlodipine 5 mg daily, hydralazine 25 mg twice daily, losartan 100 mg daily, metoprolol XL 100 mg daily ?

## 2021-08-10 NOTE — Assessment & Plan Note (Signed)
History of CVA-residual right-sided weakness ?Continue Plavix 75 mg daily, aspirin 81 mg daily, Crestor 10 mg daily ?Blood pressure well controlled ?Need to make sure diabetes is well controlled ?Encouraged regular activity, healthy diet ?

## 2021-08-10 NOTE — Assessment & Plan Note (Signed)
S/p gastric bypass 2019 ?Check vitamin levels - B1, B12, vitamin D, iron levels, tsh ?

## 2021-08-10 NOTE — Assessment & Plan Note (Signed)
Chronic ?Lab Results  ?Component Value Date  ? HGBA1C 7.4 (H) 02/10/2021  ? ?Sugars not ideally controlled when checked last ?Check A1c, urine microalbumin today ?Continue Rybelsus 14 mg daily, Farxiga 5 mg daily (does not tolerate 10 mg) ?Stressed regular exercise, diabetic diet ?Will adjust medication as needed ? ?

## 2021-08-11 ENCOUNTER — Other Ambulatory Visit: Payer: Self-pay | Admitting: Internal Medicine

## 2021-08-15 ENCOUNTER — Other Ambulatory Visit: Payer: Self-pay | Admitting: Internal Medicine

## 2021-08-16 LAB — VITAMIN B1: Vitamin B1 (Thiamine): 17 nmol/L (ref 8–30)

## 2021-09-06 ENCOUNTER — Other Ambulatory Visit: Payer: Self-pay | Admitting: Internal Medicine

## 2021-09-09 ENCOUNTER — Other Ambulatory Visit: Payer: Self-pay | Admitting: Internal Medicine

## 2021-09-21 ENCOUNTER — Ambulatory Visit: Payer: BC Managed Care – PPO | Admitting: Obstetrics & Gynecology

## 2021-09-21 ENCOUNTER — Ambulatory Visit: Payer: BC Managed Care – PPO | Admitting: Nurse Practitioner

## 2021-10-11 ENCOUNTER — Other Ambulatory Visit: Payer: Self-pay | Admitting: Internal Medicine

## 2021-10-13 ENCOUNTER — Encounter: Payer: Self-pay | Admitting: Internal Medicine

## 2021-10-13 ENCOUNTER — Ambulatory Visit: Payer: Medicare Other | Admitting: Internal Medicine

## 2021-10-13 VITALS — BP 130/88 | HR 84 | Ht 64.0 in | Wt 176.8 lb

## 2021-10-13 DIAGNOSIS — E1159 Type 2 diabetes mellitus with other circulatory complications: Secondary | ICD-10-CM

## 2021-10-13 DIAGNOSIS — E7849 Other hyperlipidemia: Secondary | ICD-10-CM | POA: Diagnosis not present

## 2021-10-13 DIAGNOSIS — E1165 Type 2 diabetes mellitus with hyperglycemia: Secondary | ICD-10-CM

## 2021-10-13 LAB — POCT GLYCOSYLATED HEMOGLOBIN (HGB A1C): Hemoglobin A1C: 8.7 % — AB (ref 4.0–5.6)

## 2021-10-13 LAB — POCT GLUCOSE (DEVICE FOR HOME USE): Glucose Fasting, POC: 142 mg/dL — AB (ref 70–99)

## 2021-10-13 MED ORDER — DAPAGLIFLOZIN PROPANEDIOL 10 MG PO TABS: 10.0000 mg | ORAL_TABLET | Freq: Every day | ORAL | 3 refills | Status: DC

## 2021-10-13 NOTE — Patient Instructions (Addendum)
Please continue: - Rybelsus 14 mg before breakfast  Please increase: - Farxiga 10 mg before breakfast  Please return in 4 months.  PATIENT INSTRUCTIONS FOR TYPE 2 DIABETES:  DIET AND EXERCISE Diet and exercise is an important part of diabetic treatment.  We recommended aerobic exercise in the form of brisk walking (working between 40-60% of maximal aerobic capacity, similar to brisk walking) for 150 minutes per week (such as 30 minutes five days per week) along with 3 times per week performing 'resistance' training (using various gauge rubber tubes with handles) 5-10 exercises involving the major muscle groups (upper body, lower body and core) performing 10-15 repetitions (or near fatigue) each exercise. Start at half the above goal but build slowly to reach the above goals. If limited by weight, joint pain, or disability, we recommend daily walking in a swimming pool with water up to waist to reduce pressure from joints while allow for adequate exercise.    BLOOD GLUCOSES Monitoring your blood glucoses is important for continued management of your diabetes. Please check your blood glucoses 2-4 times a day: fasting, before meals and at bedtime (you can rotate these measurements - e.g. one day check before the 3 meals, the next day check before 2 of the meals and before bedtime, etc.).   HYPOGLYCEMIA (low blood sugar) Hypoglycemia is usually a reaction to not eating, exercising, or taking too much insulin/ other diabetes drugs.  Symptoms include tremors, sweating, hunger, confusion, headache, etc. Treat IMMEDIATELY with 15 grams of Carbs: 4 glucose tablets  cup regular juice/soda 2 tablespoons raisins 4 teaspoons sugar 1 tablespoon honey Recheck blood glucose in 15 mins and repeat above if still symptomatic/blood glucose <100.  RECOMMENDATIONS TO REDUCE YOUR RISK OF DIABETIC COMPLICATIONS: * Take your prescribed MEDICATION(S) * Follow a DIABETIC diet: Complex carbs, fiber rich foods,  (monounsaturated and polyunsaturated) fats * AVOID saturated/trans fats, high fat foods, >2,300 mg salt per day. * EXERCISE at least 5 times a week for 30 minutes or preferably daily.  * DO NOT SMOKE OR DRINK more than 1 drink a day. * Check your FEET every day. Do not wear tightfitting shoes. Contact us if you develop an ulcer * See your EYE doctor once a year or more if needed * Get a FLU shot once a year * Get a PNEUMONIA vaccine once before and once after age 79 years  GOALS:  * Your Hemoglobin A1c of <7%  * fasting sugars need to be <130 * after meals sugars need to be <180 (2h after you start eating) * Your Systolic BP should be 453 or lower  * Your Diastolic BP should be 80 or lower  * Your HDL (Good Cholesterol) should be 40 or higher  * Your LDL (Bad Cholesterol) should be 100 or lower. * Your Triglycerides should be 150 or lower  * Your Urine microalbumin (kidney function) should be <30 * Your Body Mass Index should be 25 or lower   Please consider the following ways to cut down carbs and fat and increase fiber and micronutrients in your diet: - substitute whole grain for white bread or pasta - substitute brown rice for white rice - substitute 90-calorie flat bread pieces for slices of bread when possible - substitute sweet potatoes or yams for white potatoes - substitute humus for margarine - substitute tofu for cheese when possible - substitute almond or rice milk for regular milk (would not drink soy milk daily due to concern for soy estrogen influence on  breast cancer risk) - substitute dark chocolate for other sweets when possible - substitute water - can add lemon or orange slices for taste - for diet sodas (artificial sweeteners will trick your body that you can eat sweets without getting calories and will lead you to overeating and weight gain in the long run) - do not skip breakfast or other meals (this will slow down the metabolism and will result in more weight  gain over time)  - can try smoothies made from fruit and almond/rice milk in am instead of regular breakfast - can also try old-fashioned (not instant) oatmeal made with almond/rice milk in am - order the dressing on the side when eating salad at a restaurant (pour less than half of the dressing on the salad) - eat as little meat as possible - can try juicing, but should not forget that juicing will get rid of the fiber, so would alternate with eating raw veg./fruits or drinking smoothies - use as little oil as possible, even when using olive oil - can dress a salad with a mix of balsamic vinegar and lemon juice, for e.g. - use agave nectar, stevia sugar, or regular sugar rather than artificial sweateners - steam or broil/roast veggies  - snack on veggies/fruit/nuts (unsalted, preferably) when possible, rather than processed foods - reduce or eliminate aspartame in diet (it is in diet sodas, chewing gum, etc) Read the labels!  Try to read Dr. Janene Harvey book: "Program for Reversing Diabetes" for other ideas for healthy eating.

## 2021-10-13 NOTE — Progress Notes (Signed)
Patient ID: Rebekah Kennedy, female   DOB: 07/08/1955, 66 y.o.   MRN: [id number]  HPI: Rebekah Kennedy is a 66 y.o.-year-old female, returning for follow-up for DM2, dx in 2004, non-insulin-dependent, uncontrolled, with complications (CVA, PDR, CKD, PN, severe hypoglycemia in 2018). Pt. previously saw Dr. Loanne Drilling, last visit 11/2018.  She started a whole food plant based diet in last 3-4 weeks.  Reviewed HbA1c: Lab Results  Component Value Date   HGBA1C 8.8 (H) 08/10/2021   HGBA1C 7.4 (H) 02/10/2021   HGBA1C 8.2 (H) 11/09/2020   HGBA1C 8.6 (H) 08/05/2020   HGBA1C 8.0 (H) 04/27/2020   HGBA1C 6.8 (H) 07/24/2019   HGBA1C 7.1 (H) 04/24/2019   HGBA1C 6.7 (H) 10/23/2018   HGBA1C 8.2 (H) 07/08/2018   HGBA1C 5.8 01/22/2018   Pt is on a regimen of: - Januvia >> Rybelsus 14 mg before breakfast - Farxiga 10 >> 5 mg before breakfast Metformin caused diarrhea. She was previously on insulin between 2013 and 2019 before her gastric bypass.  Pt checks her sugars >4x a day and they are: - am: 135-140s - 2h after b'fast: n/c - before lunch: n/c - 2h after lunch: 178 - before dinner: n/c - 2h after dinner: n/c - bedtime: n/c - nighttime: n/c Lowest sugar was 100; she has hypoglycemia awareness at 70.  Highest sugar was 300s.  Glucometer:One Touch Verio  - no CKD, last BUN/creatinine:  Lab Results  Component Value Date   BUN 16 08/10/2021   BUN 17 02/10/2021   CREATININE 0.71 08/10/2021   CREATININE 0.71 02/10/2021  On losartan 100 mg daily.  -+ HL; last set of lipids: Lab Results  Component Value Date   CHOL 174 08/10/2021   HDL 63.90 08/10/2021   LDLCALC 81 08/10/2021   LDLDIRECT 126.7 03/24/2013   TRIG 145.0 08/10/2021   CHOLHDL 3 08/10/2021  On Crestor 10 mg daily.  - last eye exam was in 2022. + DR. IO injections for macular edema.  - no numbness and tingling in her feet.  Last foot exam July 21, 2021.  On ASA 81.  She had gastric bypass in 2019.  She lost 80 pounds  afterwards. She also has a history of HTN, osteopenia, depression, asthma, obstructive sleep apnea. She is a Pharmacist, hospital.  ROS: + see HPI No increased urination, blurry vision, nausea, chest pain. Complaining of leg swelling and a rash on her legs.  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   Retinal edema    gets steriod inj in the eyes     Sleep apnea    not needed since 100 pound weight loss 2019-2020   Splenomegaly    in college   Stroke (cerebrum) ([place])  06/2018   left side pt states, left wide weak per pt   Stroke [facility]) 2012   right pontine cva with left hemiparesis   Past Surgical History:  Procedure Laterality Date   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   APPENDECTOMY     yrs ago per pt 0n 11-29-2020   BIOPSY THYROID  05/02/2011   Nonneoplastic goiter   BREAST BIOPSY     BREAST LUMPECTOMY WITH RADIOACTIVE SEED LOCALIZATION Left 07/25/2017   Procedure: LEFT BREAST LUMPECTOMY WITH RADIOACTIVE SEED LOCALIZATION;  Surgeon: Coralie Keens, MD;  Location: [place];  Service: General;  Laterality: Left;   BREAST SURGERY     Reduction   cataracts Bilateral 2012   COLONOSCOPY  2021   COLPOSCOPY     DILATATION & CURETTAGE/HYSTEROSCOPY WITH MYOSURE N/A 12/02/2020   Procedure: Hominy;  Surgeon: [name], MD;   Location: [place];  Service: Gynecology;  Laterality: N/A;   [place]   DUB   EYE SURGERY     Laser for vision at times both eyes done   GASTRIC ROUX-EN-Y N/A 08/12/2017   Procedure: LAPAROSCOPIC ROUX-EN-Y GASTRIC BYPASS WITH HIATAL HERNIA REPAIR AND UPPER ENDOSCOPY;  Surgeon: [name], [name], MD;  Location: [facility];  Service: General;  Laterality: N/A;   GYNECOLOGIC CRYOSURGERY     MYOMECTOMY     OVARIAN CYST REMOVAL     PELVIC LAPAROSCOPY     DL lysis of adhesions 1975 and 1988   Social History   Socioeconomic History   Marital status: Single    Spouse name: Not on file   Number of children: 2   Years of education: 16   Highest education level: Bachelor's degree (e.g., BA, AB, BS)  Occupational History   Occupation: TEACHER    Employer: [employer]  Tobacco Use   Smoking status: Never   Smokeless tobacco: Never  Vaping Use   Vaping Use: Never used  Substance and Sexual Activity   Alcohol use: Not Currently    Alcohol/week: 0.0 standard drinks   Drug use: Never   Sexual activity: Not Currently    Birth control/protection: Post-menopausal  Other Topics Concern   Not on file  Social History Narrative   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   Living with daughter      Right handed      4 steps inside of the home      Social Determinants of Health   Financial Resource Strain: Not on file  Food Insecurity: Not on file  Transportation Needs: Not on file  Physical Activity: Not on file  Stress: Not on file  Social Connections: Not on file  Intimate Partner Violence: Not on file   Current Outpatient Medications on File Prior to Visit  Medication Sig Dispense Refill   clopidogrel (PLAVIX) 75 MG tablet TAKE 1 TABLET BY MOUTH EVERY DAY 90 tablet 1   losartan (COZAAR) 100 MG tablet TAKE 1 TABLET BY MOUTH EVERY DAY 90 tablet 1   albuterol (PROVENTIL  HFA;VENTOLIN HFA) 108 (90 Base) MCG/ACT inhaler Inhale 2 puffs into the lungs every 4 (four) hours as needed for wheezing or shortness of breath. 18 g 2   amLODipine (NORVASC) 5 MG tablet TAKE 1 TABLET BY MOUTH EVERY DAY 90 tablet 1   ammonium lactate (AMLACTIN) 12 % lotion Apply 1 application topically as needed for  dry skin. 400 g 5   aspirin EC 81 MG EC tablet Take 1 tablet (81 mg total) by mouth daily. 30 tablet 0   Biotin 5000 MCG TABS Take 10,000 mcg by mouth daily.      calcium-vitamin D (OSCAL WITH D) 500-200 MG-UNIT tablet Take 1 tablet by mouth. 1200 pm daily     Cholecalciferol (VITAMIN D3 PO) Take 2,000 Units by mouth daily.     Continuous Blood Gluc Receiver (FREESTYLE LIBRE 14 DAY READER) DEVI USE AS DIRECTED TO MONITOR SUGARS. DX E11.51 (Patient taking differently: USE AS DIRECTED TO MONITOR SUGARS. DX E11.51 Left upper arm) 1 each 0   Continuous Blood Gluc Receiver (FREESTYLE LIBRE 2 READER) DEVI Use as directed to check blood sugar 1 each 1   Continuous Blood Gluc Sensor (FREESTYLE LIBRE 2 SENSOR) MISC USE AS DIRECTED EVERY 2 WEEKS TO MONITOR BLOOD SUGARS AS INSTRUCTED 2 each 3   dapagliflozin propanediol (FARXIGA) 5 MG TABS tablet Take 1 tablet (5 mg total) by mouth daily before breakfast. 90 tablet 1   glucose blood (FREESTYLE PRECISION NEO TEST) test strip Use as instructed 100 each 12   hydrALAZINE (APRESOLINE) 25 MG tablet TAKE 1 TABLET BY MOUTH THREE TIMES A DAY 270 tablet 1   metoprolol succinate (TOPROL-XL) 100 MG 24 hr tablet TAKE 1 TABLET BY MOUTH DAILY WITH OR IMMEDIATELY FOLLOWING A MEAL 90 tablet 1   montelukast (SINGULAIR) 10 MG tablet TAKE 1 TABLET BY MOUTH EVERY DAY 90 tablet 1   nystatin-triamcinolone ointment (MYCOLOG) APPLY TO AFFECTED AREA TWICE A DAY (Patient taking differently: as needed.) 30 g 4   OVER THE COUNTER MEDICATION Take 1 tablet by mouth daily. Multivitamin centrum 1 daily     rosuvastatin (CRESTOR) 10 MG tablet TAKE 1 TABLET BY MOUTH EVERY DAY 90  tablet 3   Semaglutide (RYBELSUS) 14 MG TABS Take 1 tablet (14 mg total) by mouth daily before breakfast. 90 tablet 1   tacrolimus (PROTOPIC) 0.1 % ointment Apply topically 2 (two) times daily. (Patient taking differently: Apply topically as needed.) 30 g 2   No current facility-administered medications on file prior to visit.   Allergies  Allergen Reactions   Quinapril Cough   Metformin And Related     Loose stool   Family History  Problem Relation Age of Onset   Cancer Maternal Grandmother        Colon Cancer   Asthma Maternal Grandmother    Colon cancer Maternal Grandmother    Diabetes Father    Heart disease Father    Hypertension Father    Hyperlipidemia Father    Stroke Father        passed at age 41 from stroke   Hypertension Mother    Heart disease Mother    Ovarian cancer Mother    Cancer Mother        Lung cancer   Asthma Mother    COPD Mother    Hyperlipidemia Mother    Diabetes Brother    Kidney disease Brother    Hypertension Brother    Berenice Primas' disease Sister    Diabetes Sister    Breast cancer Sister 69   Graves' disease Paternal Grandmother    Hypertension Paternal Grandmother    Heart disease Paternal Grandmother    Alzheimer's disease Paternal Grandmother    Cancer Maternal Grandfather    Heart failure Brother    Prostate cancer Brother    Diabetes Brother    Asthma Brother  Hypertension Brother    Heart failure Brother    Prostate cancer Brother    Stroke Brother    Hypertension Brother    PE: BP 130/88 (BP Location: Right Arm, Patient Position: Sitting, Cuff Size: Normal)   Pulse 84   Ht '5\' 4"'$  (1.626 m)   Wt 176 lb 12.8 oz (80.2 kg)   SpO2 98%   BMI 30.35 kg/m  Wt Readings from Last 3 Encounters:  10/13/21 176 lb 12.8 oz (80.2 kg)  08/10/21 175 lb 6.4 oz (79.6 kg)  07/21/21 175 lb 3.2 oz (79.5 kg)   Constitutional: overweight, in NAD Eyes: PERRLA, EOMI, no exophthalmos ENT: moist mucous membranes, no thyromegaly, no cervical  lymphadenopathy Cardiovascular: RRR, No MRG, + pitting edema B legs up to knees Respiratory: CTA B Musculoskeletal: no deformities, strength intact in all 4 Skin: moist, warm, + sparse macular rash on bilateral lower legs Neurological: no tremor with outstretched hands, DTR normal in all 4  ASSESSMENT: 1. DM2, non-insulin-dependent, uncontrolled, with  complications - CVA - 42/3536 - CKD - PDR - PN - severe hypoglycemia in 2018  2. HL  PLAN:  1. Patient with long-standing, uncontrolled diabetes, on oral antidiabetic regimen, with still poor control.  Latest HbA1c was 8.8%, higher, 2 months ago. She was last seen by Dr. Loanne Drilling almost 3 years ago. -At today's visit, we repeated her HbA1c and this was only slightly lower, at 8.7%.  She does have a freestyle libre 2 CGM, but unfortunately, she does not bring the receiver today so we could not download it.  Also, she cannot remember the values well as she mentions that she is not frequently looking at it.  I advised her check her blood sugars more frequently. -She continues on an oral medicine regimen with p.o. GLP-1 receptor agonist and SGLT2 inhibitor.  She tolerates this well.  She mentions that [place] dose was higher in the past but was reduced over the years due to good control. -At today's visit, we will check her for insulin deficiency: Orders Placed This Encounter  Procedures   Glucose, fasting   C-peptide   Anti-islet cell antibody   ZNT8 Antibodies   Glutamic acid decarboxylase auto abs   POCT glycosylated hemoglobin (Hb A1C)   POCT Glucose (Device for Home Use)  -If these labs are abnormal, she may need insulin.  Until then, will increase her Farxiga dose.  I do expect her blood sugars to improve now on the whole food plant-based diet.  If not, we may need to change from Rybelsus to Ozempic at next visit. -She is interested in changing her CGM to a freestyle libre 3.  I am not sure whether this would be covered by her  insurance or not.  She has Medicare.  We did discuss that the CGM's are now covered under Medicare for patients at risk for hypoglycemia, even if they are not on insulin.  She would be interested to use her phone to check her blood sugars, but I am not sure if her phone is compatible with the [place] 2 and 3.  For now, we will continue on the libre 2, but I did advise him to bring the device next time so we can download it.  - I suggested to:  Patient Instructions  Please continue: - Rybelsus 14 mg before breakfast  Please increase: - Farxiga 10 mg before breakfast  Please return in 4 months.  - check sugars at different times of the day - check 4x  a day, rotating checks - discussed about CBG targets for treatment: 80-130 mg/dL before meals and <180 mg/dL after meals; target HbA1c <7%. - given sugar log and advised how to fill it and to bring it at next appt  - given foot care handout  - given instructions for hypoglycemia management "15-15 rule"  - advised for yearly eye exams  - Return to clinic in 4 mo  2. HL - Reviewed latest lipid panel from 07/2021: LDL above our target of less than 55 due to history of cardiovascular disease: Lab Results  Component Value Date   CHOL 174 08/10/2021   HDL 63.90 08/10/2021   LDLCALC 81 08/10/2021   LDLDIRECT 126.7 03/24/2013   TRIG 145.0 08/10/2021   CHOLHDL 3 08/10/2021  - Continues Crestor 10 mg daily without side effects. -Expect the cholesterol to improve further with improved diabetes control and especially on the whole food plant-based diet  - Total time spent for the visit: 40 minutes, in precharting, obtaining medical information from the chart and from the patient, reviewing Dr. Cordelia Pen last note, her  previous labs, evaluations, and treatments, reviewing her symptoms, counseling her about her conditions (please see the discussed topics above), and developing a plan to further investigate and treat them; she had a number of questions  which I addressed.  Component     Latest Ref Rng 10/13/2021  Hemoglobin A1C     4.0 - 5.6 % 8.7 !   Glucose     65 - 99 mg/dL 120 (H)   Glucose Fasting, POC     70 - 99 mg/dL 142 !   Islet Cell Ab     Neg:<1:1  Negative   C-Peptide     0.80 - 3.85 ng/mL 1.92   ZNT8 Antibodies     <15 U/mL <10   Glutamic Acid Decarb Ab     <5 IU/mL <5     No insulin deficiency or antipancreatic autoimmunity.  In this case, we can continue without insulin.  Philemon Kingdom, MD PhD [facility]

## 2021-10-17 DIAGNOSIS — Z1231 Encounter for screening mammogram for malignant neoplasm of breast: Secondary | ICD-10-CM | POA: Diagnosis not present

## 2021-10-18 LAB — ANTI-ISLET CELL ANTIBODY: Islet Cell Ab: NEGATIVE

## 2021-10-22 LAB — GLUCOSE, FASTING: Glucose, Bld: 120 mg/dL — ABNORMAL HIGH (ref 65–99)

## 2021-10-22 LAB — GLUTAMIC ACID DECARBOXYLASE AUTO ABS: Glutamic Acid Decarb Ab: <5 IU/mL (ref <5)

## 2021-10-22 LAB — ZNT8 ANTIBODIES: ZNT8 Antibodies: <10 U/mL (ref <15)

## 2021-10-22 LAB — C-PEPTIDE: C-Peptide: 1.92 ng/mL (ref 0.80–3.85)

## 2021-10-27 ENCOUNTER — Encounter: Payer: Self-pay | Admitting: Obstetrics & Gynecology

## 2021-10-27 ENCOUNTER — Other Ambulatory Visit (HOSPITAL_COMMUNITY)
Admission: RE | Admit: 2021-10-27 | Discharge: 2021-10-27 | Disposition: A | Discharge status: Home / Self Care | Payer: Medicare Other | Source: Ambulatory Visit | Attending: Nurse Practitioner | Admitting: Nurse Practitioner

## 2021-10-27 ENCOUNTER — Ambulatory Visit (INDEPENDENT_AMBULATORY_CARE_PROVIDER_SITE_OTHER): Payer: Medicare Other | Admitting: Obstetrics & Gynecology

## 2021-10-27 VITALS — BP 118/74 | HR 64 | Resp 16 | Ht 64.75 in | Wt 176.0 lb

## 2021-10-27 DIAGNOSIS — M8580 Other specified disorders of bone density and structure, unspecified site: Secondary | ICD-10-CM | POA: Diagnosis not present

## 2021-10-27 DIAGNOSIS — Z01419 Encounter for gynecological examination (general) (routine) without abnormal findings: Secondary | ICD-10-CM | POA: Diagnosis not present

## 2021-10-27 DIAGNOSIS — R350 Frequency of micturition: Secondary | ICD-10-CM

## 2021-10-27 DIAGNOSIS — Z780 Asymptomatic menopausal state: Secondary | ICD-10-CM | POA: Diagnosis not present

## 2021-10-27 LAB — URINALYSIS, COMPLETE W/RFL CULTURE
Bacteria, UA: NONE SEEN /HPF
Bilirubin Urine: NEGATIVE
Casts: NONE SEEN /LPF
Crystals: NONE SEEN /HPF
Hgb urine dipstick: NEGATIVE
Hyaline Cast: NONE SEEN /LPF
Ketones, ur: NEGATIVE
Leukocyte Esterase: NEGATIVE
Nitrites, Initial: NEGATIVE
RBC / HPF: NONE SEEN /HPF (ref 0–2)
Specific Gravity, Urine: 1.027 (ref 1.001–1.035)
WBC, UA: NONE SEEN /HPF (ref 0–5)
Yeast: NONE SEEN /HPF
pH: 6.5 (ref 5.0–8.0)

## 2021-10-27 LAB — NO CULTURE INDICATED

## 2021-10-27 NOTE — Progress Notes (Signed)
Rebekah Kennedy Feb 18, 1956 [id number]   History:    66 y.o. G0  Single.  Retired Animator.  RP:  Established patient presenting for annual gyn exam   HPI: Postmenopausal, well on no HRT.  HSC/Myosure excision of Polyp/D+C on 12/02/2020. Patho benign.  No PMB since then.  No pelvic pain. Abstinent.  Pap Neg 03/2018.  Pap reflex today.  Breasts normal.  Will obtain Mammo report done at [facility] last week.  Fam h/o Ovarian Cancer in Mother and cousin.   Her sister was diagnosed with breast cancer.  C/O urinary frequency.  Dx diabetes 2002. Using insulin since 2005. Hx 2 TIAs in 2012, and left brain stem stoke in 2020. Bariatric surgery 07/2017 lost 90lbs.  Current BMI 29.51.  Walking.  Colono 2020.  BD Osteopenia 09/2020.    Past medical history,surgical history, family history and social history were all reviewed and documented in the EPIC chart.  Gynecologic History No LMP recorded. Patient is postmenopausal.  Obstetric History OB History  Gravida Para Term Preterm AB Living  0            SAB IAB Ectopic Multiple Live Births                ROS: A ROS was performed and pertinent positives and negatives are included in the history.  GENERAL: No fevers or chills. HEENT: No change in vision, no earache, sore throat or sinus congestion. NECK: No pain or stiffness. CARDIOVASCULAR: No chest pain or pressure. No palpitations. PULMONARY: No shortness of breath, cough or wheeze. GASTROINTESTINAL: No abdominal pain, nausea, vomiting or diarrhea, melena or bright red blood per rectum. GENITOURINARY: No urinary frequency, urgency, hesitancy or dysuria. MUSCULOSKELETAL: No joint or muscle pain, no back pain, no recent trauma. DERMATOLOGIC: No rash, no itching, no lesions. ENDOCRINE: No polyuria, polydipsia, no heat or cold intolerance. No recent change in weight. HEMATOLOGICAL: No anemia or easy bruising or bleeding. NEUROLOGIC: No headache, seizures, numbness, tingling or weakness. PSYCHIATRIC: No  depression, no loss of interest in normal activity or change in sleep pattern.     Exam:   BP 118/74   Pulse 64   Resp 16   Ht 5' 4.75" (1.645 m)   Wt 176 lb (79.8 kg)   BMI 29.51 kg/m   Body mass index is 29.51 kg/m.  General appearance : Well developed well nourished female. No acute distress HEENT: Eyes: no retinal hemorrhage or exudates,  Neck supple, trachea midline, no carotid bruits, no thyroidmegaly Lungs: Clear to auscultation, no rhonchi or wheezes, or rib retractions  Heart: Regular rate and rhythm, no murmurs or gallops Breast:Examined in sitting and supine position were symmetrical in appearance, no palpable masses or tenderness,  no skin retraction, no nipple inversion, no nipple discharge, no skin discoloration, no axillary or supraclavicular lymphadenopathy Abdomen: no palpable masses or tenderness, no rebound or guarding Extremities: no edema or skin discoloration or tenderness  Pelvic: Vulva: Normal             Vagina: No gross lesions or discharge  Cervix: No gross lesions or discharge.  Pap reflex done.  Uterus  AV, normal size, shape and consistency, non-tender and mobile  Adnexa  Without masses or tenderness  Anus: Normal  U/A Completely Negative   Assessment/Plan:  66 y.o. female for annual exam   1. Encounter for routine gynecological examination with Papanicolaou smear of cervix Postmenopausal, well on no HRT.  HSC/Myosure excision of Polyp/D+C on 12/02/2020. Patho benign.  No PMB since then.  No pelvic pain. Abstinent.  Pap Neg 03/2018.  Pap reflex today.  Breasts normal.  Will obtain Mammo report done at [facility] last week.  Fam h/o Ovarian Cancer in Mother and cousin.   Her sister was diagnosed with breast cancer.  C/O urinary frequency.  Dx diabetes 2002. Using insulin since 2005. Hx 2 TIAs in 2012, and left brain stem stoke in 2020. Bariatric surgery 07/2017 lost 90lbs.  Current BMI 29.51.  Walking.  Colono 2020.  BD Osteopenia 09/2020. - Cytology - PAP(  [place])  2. Postmenopause Postmenopausal, well on no HRT.  HSC/Myosure excision of Polyp/D+C on 12/02/2020. Patho benign.  No PMB since then.  No pelvic pain. Abstinent.   3. Osteopenia, unspecified location BD Osteopenia 09/2020, T-Score -1.9.  Will repeat at 2 yrs.  Vit D, Ca++ total 1.5 g/d and weight bearing activities.  4. Urinary frequency U/A Neg.  Counseling and reassurance given for 10 minutes. - Urinalysis,Complete w/RFL Culture  Other orders - UNABLE TO FIND; Med Name: eye injections - REFLEXIVE URINE CULTURE   [name] MD, 11:31 AM 10/27/2021

## 2021-10-30 LAB — CYTOLOGY - PAP: Diagnosis: NEGATIVE

## 2021-11-07 DIAGNOSIS — R2681 Unsteadiness on feet: Secondary | ICD-10-CM | POA: Diagnosis not present

## 2021-11-07 DIAGNOSIS — R531 Weakness: Secondary | ICD-10-CM | POA: Diagnosis not present

## 2021-11-07 DIAGNOSIS — R2689 Other abnormalities of gait and mobility: Secondary | ICD-10-CM | POA: Diagnosis not present

## 2021-11-16 DIAGNOSIS — R531 Weakness: Secondary | ICD-10-CM | POA: Diagnosis not present

## 2021-11-16 DIAGNOSIS — R2689 Other abnormalities of gait and mobility: Secondary | ICD-10-CM | POA: Diagnosis not present

## 2021-11-16 DIAGNOSIS — R2681 Unsteadiness on feet: Secondary | ICD-10-CM | POA: Diagnosis not present

## 2021-11-21 ENCOUNTER — Other Ambulatory Visit: Payer: Self-pay | Admitting: Internal Medicine

## 2021-11-23 ENCOUNTER — Other Ambulatory Visit: Payer: Self-pay | Admitting: Obstetrics & Gynecology

## 2021-11-23 DIAGNOSIS — H35033 Hypertensive retinopathy, bilateral: Secondary | ICD-10-CM | POA: Diagnosis not present

## 2021-11-23 DIAGNOSIS — E113311 Type 2 diabetes mellitus with moderate nonproliferative diabetic retinopathy with macular edema, right eye: Secondary | ICD-10-CM | POA: Diagnosis not present

## 2021-11-23 DIAGNOSIS — N9089 Other specified noninflammatory disorders of vulva and perineum: Secondary | ICD-10-CM

## 2021-11-23 DIAGNOSIS — H3582 Retinal ischemia: Secondary | ICD-10-CM | POA: Diagnosis not present

## 2021-11-23 DIAGNOSIS — E113592 Type 2 diabetes mellitus with proliferative diabetic retinopathy without macular edema, left eye: Secondary | ICD-10-CM | POA: Diagnosis not present

## 2021-11-23 NOTE — Telephone Encounter (Signed)
Last AEX 10/27/21.  Uses PRN for vulvar irritation.

## 2021-11-28 DIAGNOSIS — R531 Weakness: Secondary | ICD-10-CM | POA: Diagnosis not present

## 2021-11-28 DIAGNOSIS — R2681 Unsteadiness on feet: Secondary | ICD-10-CM | POA: Diagnosis not present

## 2021-11-28 DIAGNOSIS — R2689 Other abnormalities of gait and mobility: Secondary | ICD-10-CM | POA: Diagnosis not present

## 2021-11-30 DIAGNOSIS — R2681 Unsteadiness on feet: Secondary | ICD-10-CM | POA: Diagnosis not present

## 2021-11-30 DIAGNOSIS — R2689 Other abnormalities of gait and mobility: Secondary | ICD-10-CM | POA: Diagnosis not present

## 2021-11-30 DIAGNOSIS — R531 Weakness: Secondary | ICD-10-CM | POA: Diagnosis not present

## 2021-12-05 DIAGNOSIS — R2689 Other abnormalities of gait and mobility: Secondary | ICD-10-CM | POA: Diagnosis not present

## 2021-12-05 DIAGNOSIS — R2681 Unsteadiness on feet: Secondary | ICD-10-CM | POA: Diagnosis not present

## 2021-12-05 DIAGNOSIS — R531 Weakness: Secondary | ICD-10-CM | POA: Diagnosis not present

## 2021-12-12 DIAGNOSIS — R2681 Unsteadiness on feet: Secondary | ICD-10-CM | POA: Diagnosis not present

## 2021-12-12 DIAGNOSIS — R2689 Other abnormalities of gait and mobility: Secondary | ICD-10-CM | POA: Diagnosis not present

## 2021-12-12 DIAGNOSIS — R531 Weakness: Secondary | ICD-10-CM | POA: Diagnosis not present

## 2021-12-13 ENCOUNTER — Other Ambulatory Visit: Payer: Self-pay | Admitting: Internal Medicine

## 2021-12-14 DIAGNOSIS — R2681 Unsteadiness on feet: Secondary | ICD-10-CM | POA: Diagnosis not present

## 2021-12-14 DIAGNOSIS — R2689 Other abnormalities of gait and mobility: Secondary | ICD-10-CM | POA: Diagnosis not present

## 2021-12-14 DIAGNOSIS — R531 Weakness: Secondary | ICD-10-CM | POA: Diagnosis not present

## 2021-12-19 DIAGNOSIS — R2689 Other abnormalities of gait and mobility: Secondary | ICD-10-CM | POA: Diagnosis not present

## 2021-12-19 DIAGNOSIS — R2681 Unsteadiness on feet: Secondary | ICD-10-CM | POA: Diagnosis not present

## 2021-12-19 DIAGNOSIS — R531 Weakness: Secondary | ICD-10-CM | POA: Diagnosis not present

## 2021-12-26 DIAGNOSIS — R2681 Unsteadiness on feet: Secondary | ICD-10-CM | POA: Diagnosis not present

## 2021-12-26 DIAGNOSIS — R2689 Other abnormalities of gait and mobility: Secondary | ICD-10-CM | POA: Diagnosis not present

## 2021-12-26 DIAGNOSIS — R531 Weakness: Secondary | ICD-10-CM | POA: Diagnosis not present

## 2021-12-27 ENCOUNTER — Ambulatory Visit: Payer: Medicare Other | Admitting: Neurology

## 2021-12-27 ENCOUNTER — Ambulatory Visit: Payer: Self-pay | Admitting: Licensed Clinical Social Worker

## 2021-12-27 NOTE — Patient Instructions (Signed)
Visit Information  Thank you for taking time to visit with me today. Please don't hesitate to contact me if I can be of assistance to you.   Following are the goals we discussed today: increasing your social connection  Goals Addressed             This Visit's Progress    COMPLETED: Care Coordination Activities/ No follow up required       Care Coordination Interventions: Solution-Focused Strategies employed:  Reviewed social connection  for Customer service manager of [place]  [phone] [email or web address]  [employer] of [place] [phone]  [email or web address]       Please call the care guide team at [phone] if you need to cancel or reschedule your appointment.   If you are experiencing a Mental Health or [place] or need someone to talk to, please call 1-800-273-TALK (toll free, 24 hour hotline)   Patient verbalizes understanding of instructions and care plan provided today and agrees to view in [place]. Active MyChart status and patient understanding of how to access instructions and care plan via MyChart confirmed with patient.     Your AWV (Annual Wellness Visit) scheduled for: 12/29/21 No further follow up required: by [place], [place] [phone]

## 2021-12-27 NOTE — Patient Outreach (Signed)
  Care Coordination   Initial Visit Note   12/27/2021 Name: DONESHA WALLANDER MRN: [id number] DOB: Sep 13, 1955  Glory Rosebush is a 66 y.o. year old female who sees Burns, Claudina Lick, MD for primary care. I spoke with  Glory Rosebush by phone today  What matters to the patients health and wellness today?  Social Connection & Volunteer opportunities Assessed patient's needs, support system and barriers to care.  Patient reports no concerns or needs with health and wellness related to physical or mental heath.  Reviewed benefits of Medicare Annual Wellness Visit.   Recommendation: Patient may benefit from, and is in agreement to Schedule Medicare AWV for 12/29/21.    Goals Addressed             This Visit's Progress    COMPLETED: Care Coordination Activities/ No follow up required       Care Coordination Interventions: Solution-Focused Strategies employed:  Reviewed social connection  for Customer service manager of [place]  [phone] [email or web address]  [employer] of [place] [phone]  [email or web address]        SDOH assessments and interventions completed:  Yes  SDOH Interventions Today    Flowsheet Row Most Recent Value  SDOH Interventions   SDOH Interventions for the Following Domains Social Connections  Food Insecurity Interventions Intervention Not Indicated  Housing Interventions Intervention Not Indicated  Social Connections Interventions Other (Comment)  [provided information]  Transportation Interventions Intervention Not Indicated       Care Coordination Interventions Activated:  Yes  Care Coordination Interventions:  Yes, provided   Follow up plan: No further intervention required. By care coordination at this time.  Encounter Outcome:  Pt. Visit Completed   Casimer Lanius, [place] [phone]

## 2021-12-29 ENCOUNTER — Ambulatory Visit (INDEPENDENT_AMBULATORY_CARE_PROVIDER_SITE_OTHER): Payer: Medicare Other | Admitting: *Deleted

## 2021-12-29 ENCOUNTER — Encounter: Payer: Self-pay | Admitting: *Deleted

## 2021-12-29 VITALS — BP 136/84 | HR 68 | Temp 98.0°F | Ht 64.75 in | Wt 177.4 lb

## 2021-12-29 DIAGNOSIS — Z0000 Encounter for general adult medical examination without abnormal findings: Secondary | ICD-10-CM | POA: Diagnosis not present

## 2021-12-29 NOTE — Progress Notes (Signed)
Subjective:   Rebekah Kennedy is a 66 y.o. female who presents for an Initial Medicare Annual Wellness Visit.  Review of Systems    Defer to PCP       Objective:    Today's Vitals   12/29/21 0915  BP: 136/84  Pulse: 68  Temp: 98 F (36.7 C)  SpO2: 96%  Weight: 177 lb 6 oz (80.5 kg)  Height: 5' 4.75" (1.645 m)   Body mass index is 29.75 kg/m.     12/29/2021    9:35 AM 03/27/2021   11:37 PM 12/02/2020    7:30 AM 03/24/2020    7:44 AM 06/04/2019   10:55 AM 03/23/2019    5:40 PM 03/02/2019    6:30 PM  Advanced Directives  Does Patient Have a Medical Advance Directive? No No No No No No No  Would patient like information on creating a medical advance directive? No - Patient declined  Yes (MAU/Ambulatory/Procedural Areas - Information given)   Yes (MAU/Ambulatory/Procedural Areas - Information given) Yes (MAU/Ambulatory/Procedural Areas - Information given)    Current Medications (verified) Outpatient Encounter Medications as of 12/29/2021  Medication Sig   albuterol (PROVENTIL HFA;VENTOLIN HFA) 108 (90 Base) MCG/ACT inhaler Inhale 2 puffs into the lungs every 4 (four) hours as needed for wheezing or shortness of breath.   amLODipine (NORVASC) 5 MG tablet TAKE 1 TABLET BY MOUTH EVERY DAY   ammonium lactate (AMLACTIN) 12 % lotion Apply 1 application topically as needed for dry skin.   aspirin EC 81 MG EC tablet Take 1 tablet (81 mg total) by mouth daily.   Biotin 5000 MCG TABS Take 10,000 mcg by mouth daily.    calcium-vitamin D (OSCAL WITH D) 500-200 MG-UNIT tablet Take 1 tablet by mouth. 1200 pm daily   Cholecalciferol (VITAMIN D3 PO) Take 2,000 Units by mouth daily.   clopidogrel (PLAVIX) 75 MG tablet TAKE 1 TABLET BY MOUTH EVERY DAY   Continuous Blood Gluc Receiver (FREESTYLE LIBRE 14 DAY READER) DEVI USE AS DIRECTED TO MONITOR SUGARS. DX E11.51 (Patient taking differently: USE AS DIRECTED TO MONITOR SUGARS. DX E11.51 Left upper arm)   Continuous Blood Gluc Receiver  (FREESTYLE LIBRE 2 READER) DEVI Use as directed to check blood sugar   Continuous Blood Gluc Sensor (FREESTYLE LIBRE 2 SENSOR) MISC USE AS DIRECTED EVERY 2 WEEKS TO MONITOR BLOOD SUGARS AS INSTRUCTED   dapagliflozin propanediol (FARXIGA) 10 MG TABS tablet Take 1 tablet (10 mg total) by mouth daily before breakfast.   glucose blood (FREESTYLE PRECISION NEO TEST) test strip Use as instructed   hydrALAZINE (APRESOLINE) 25 MG tablet TAKE 1 TABLET BY MOUTH THREE TIMES A DAY   losartan (COZAAR) 100 MG tablet TAKE 1 TABLET BY MOUTH EVERY DAY   metoprolol succinate (TOPROL-XL) 100 MG 24 hr tablet TAKE 1 TABLET BY MOUTH DAILY WITH OR IMMEDIATELY FOLLOWING A MEAL   montelukast (SINGULAIR) 10 MG tablet TAKE 1 TABLET BY MOUTH EVERY DAY   nystatin-triamcinolone ointment (MYCOLOG) Apply topically as needed.   OVER THE COUNTER MEDICATION Take 1 tablet by mouth daily. Multivitamin centrum 1 daily   rosuvastatin (CRESTOR) 10 MG tablet TAKE 1 TABLET BY MOUTH EVERY DAY   Semaglutide (RYBELSUS) 14 MG TABS Take 1 tablet (14 mg total) by mouth daily before breakfast.   tacrolimus (PROTOPIC) 0.1 % ointment Apply topically 2 (two) times daily. (Patient taking differently: Apply topically as needed.)   FARXIGA 5 MG TABS tablet TAKE 1 TABLET BY MOUTH DAILY BEFORE BREAKFAST. (Patient not  taking: Reported on 12/29/2021)   UNABLE TO FIND Med Name: eye injections (Patient not taking: Reported on 12/29/2021)   No facility-administered encounter medications on file as of 12/29/2021.    Allergies (verified) Quinapril and Metformin and related   History: Past Medical History:  Diagnosis Date   Ambulates with cane 11/29/2020   outside of home   ASTHMA NOS W/ACUTE EXACERBATION 07/10/2010   Cervical dysplasia    DEPRESSION 03/16/2007   DIABETES MELLITUS, TYPE II 12/01/2006   dx 2004   Dysmenorrhea    Fibroid    History of blood product transfusion    took own blood that was banked in 1990's   HYPERLIPIDEMIA 12/01/2006    HYPERTENSION 07/07/2006   Macular edema    Neuropathy    fingertips and tose some numbness, right foot numb at times   Obesity    Osteopenia 05/2017   T score -1.5 FRAX 2.6% / 0.1%   Pneumonia    1990's and 2000 none since   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 issuse resolved   Retinal edema    gets steriod inj in the eyes     Sleep apnea    not needed since 100 pound weight loss 2019-2020   Splenomegaly    in college   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   left side pt states, left wide weak per pt   Stroke [facility]) 2012   right pontine cva with left hemiparesis   Past Surgical History:  Procedure Laterality Date   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   APPENDECTOMY     yrs ago per pt 0n 11-29-2020   BIOPSY THYROID  05/02/2011   Nonneoplastic goiter   BREAST BIOPSY     BREAST LUMPECTOMY WITH RADIOACTIVE SEED LOCALIZATION Left 07/25/2017   Procedure: LEFT BREAST LUMPECTOMY WITH RADIOACTIVE SEED LOCALIZATION;  Surgeon: Coralie Keens, MD;  Location: [facility];  Service: General;  Laterality: Left;   BREAST SURGERY     Reduction   cataracts Bilateral 2012   COLONOSCOPY  2021   COLPOSCOPY     DILATATION & CURETTAGE/HYSTEROSCOPY WITH MYOSURE N/A 12/02/2020   Procedure: DILATATION & CURETTAGE/HYSTEROSCOPY WITH MYOSURE;  Surgeon: [name], MD;  Location: [facility];  Service: Gynecology;  Laterality: N/A;   DILATION AND CURETTAGE OF UTERUS  1975   DUB   EYE SURGERY     Laser for vision at times both eyes done   GASTRIC ROUX-EN-Y N/A 08/12/2017   Procedure: LAPAROSCOPIC ROUX-EN-Y GASTRIC BYPASS WITH HIATAL HERNIA REPAIR AND UPPER ENDOSCOPY;  Surgeon: [name], [name], MD;  Location: [facility];  Service: General;  Laterality: N/A;   GYNECOLOGIC CRYOSURGERY     MYOMECTOMY     OVARIAN CYST REMOVAL     PELVIC LAPAROSCOPY     DL lysis of adhesions 1975 and 1988   Family History  Problem Relation Age of Onset   Cancer Maternal  Grandmother        Colon Cancer   Asthma Maternal Grandmother    Colon cancer Maternal Grandmother    Diabetes Father    Heart disease Father    Hypertension Father    Hyperlipidemia Father    Stroke Father        passed at age 83 from stroke   Hypertension Mother    Heart disease Mother    Ovarian cancer Mother    Cancer Mother        Lung  cancer   Asthma Mother    COPD Mother    Hyperlipidemia Mother    Diabetes Brother    Kidney disease Brother    Hypertension Brother    Berenice Primas' disease Sister    Diabetes Sister    Breast cancer Sister 70   Graves' disease Paternal Grandmother    Hypertension Paternal Grandmother    Heart disease Paternal Grandmother    Alzheimer's disease Paternal Grandmother    Cancer Maternal Grandfather    Heart failure Brother    Prostate cancer Brother    Diabetes Brother    Asthma Brother    Hypertension Brother    Heart failure Brother    Prostate cancer Brother    Stroke Brother    Hypertension Brother    Social History   Socioeconomic History   Marital status: Single    Spouse name: Not on file   Number of children: 2   Years of education: 16   Highest education level: Bachelor's degree (e.g., BA, AB, BS)  Occupational History   Occupation: TEACHER    Employer: [employer]  Tobacco Use   Smoking status: Never   Smokeless tobacco: Never  Vaping Use   Vaping Use: Never used  Substance and Sexual Activity   Alcohol use: Not Currently    Alcohol/week: 0.0 standard drinks of alcohol   Drug use: Never   Sexual activity: Not Currently    Birth control/protection: Post-menopausal    Comment: older than 16, less than 5  Other Topics Concern   Not on file  Social History Narrative   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   Living with daughter      Right handed      4 steps inside of the home      Social Determinants of Health   Financial Resource Strain: Low  Risk  (12/29/2021)   Overall Financial Resource Strain (CARDIA)    Difficulty of Paying Living Expenses: Not hard at all  Food Insecurity: No Food Insecurity (12/29/2021)   Hunger Vital Sign    Worried About Running Out of Food in the Last Year: Never true    Ran Out of Food in the Last Year: Never true  Transportation Needs: No Transportation Needs (12/29/2021)   PRAPARE - Hydrologist (Medical): No    Lack of Transportation (Non-Medical): No  Physical Activity: Insufficiently Active (12/29/2021)   Exercise Vital Sign    Days of Exercise per Week: 1 day    Minutes of Exercise per Session: 30 min  Stress: No Stress Concern Present (12/29/2021)   [place]    Feeling of Stress : Not at all  Social Connections: Moderately Isolated (12/29/2021)   Social Connection and Isolation Panel [NHANES]    Frequency of Communication with Friends and Family: More than three times a week    Frequency of Social Gatherings with Friends and Family: More than three times a week    Attends Religious Services: 1 to 4 times per year    Active Member of [employer] or Organizations: No    Attends Archivist Meetings: Never    Marital Status: Never married    Tobacco Counseling Counseling given: Not Answered   Clinical Intake:  Pre-visit preparation completed: Yes  Pain : No/denies pain     Nutritional Status: BMI 25 -29 Overweight Nutritional Risks: None  Diabetes: Yes CBG done?: No Did pt. bring in CBG monitor from home?: No  What is the last grade level you completed in school?: college  Diabetic?Nutrition Risk Assessment:  Has the patient had any N/V/D within the last 2 months?  No  Does the patient have any non-healing wounds?  No  Has the patient had any unintentional weight loss or weight gain?  No   Diabetes:  Is the patient diabetic?  Yes  If diabetic, was a CBG obtained today?  No   Did the patient bring in their glucometer from home?  No  How often do you monitor your CBG's? Pt check her blood sugars once a day maybe twice a day.   Financial Strains and Diabetes Management:  Are you having any financial strains with the device, your supplies or your medication? No .  Does the patient want to be seen by Chronic Care Management for management of their diabetes?  No  Would the patient like to be referred to a Nutritionist or for Diabetic Management?  No   Diabetic Exams:  Diabetic Eye Exam: Completed 07/2021, pt unsure of date Diabetic Foot Exam: Completed 07/21/2021    Interpreter Needed?: No      Activities of Daily Living    12/29/2021    9:38 AM  In your present state of health, do you have any difficulty performing the following activities:  Hearing? 0  Vision? 1  Comment patient has some issues with seeing  Difficulty concentrating or making decisions? 0  Walking or climbing stairs? 0  Dressing or bathing? 0  Doing errands, shopping? 0    Patient Care Team: Binnie Rail, MD as PCP - General (Internal Medicine) Renato Shin, MD (Inactive) (Internal Medicine) Alda Berthold, DO as Consulting Physician (Neurology)  Indicate any recent Medical Services you may have received from other than [facility] providers in the past year (date may be approximate).     Assessment:   This is a routine wellness examination for Rebekah Kennedy.  Hearing/Vision screen No results found.  Dietary issues and exercise activities discussed: Current Exercise Habits: Home exercise routine, Type of exercise: walking, Time (Minutes): 30, Frequency (Times/Week): 2, Weekly Exercise (Minutes/Week): 60, Intensity: Mild, Exercise limited by: cardiac condition(s)   Goals Addressed               This Visit's Progress     Diabetes Patient stated goal (pt-stated)        Patient wants to get her A1c down to appropriate level and to improve her balance and gait       Depression  Screen    12/29/2021    9:32 AM 11/09/2020    9:09 AM 10/23/2018   11:09 AM 10/08/2017    3:26 PM 12/10/2016    5:03 PM 05/17/2016    3:41 PM 05/03/2016    5:10 PM  PHQ 2/9 Scores  PHQ - 2 Score 0 0 0 0 0 0 0    Fall Risk    12/29/2021    9:22 AM 11/09/2020    9:09 AM 03/24/2020    7:44 AM 06/04/2019   10:55 AM 02/09/2019    9:39 AM  Fall Risk   Falls in the past year? 1 0 1 0 0  Number falls in past yr: 1 0 1 0 0  Injury with Fall? 1 0 0 0 0  Risk for fall due to : Impaired balance/gait      Follow up Falls evaluation completed  FALL RISK PREVENTION PERTAINING TO THE HOME:  Any stairs in or around the home? Yes  If so, are there any without handrails? Yes  Home free of loose throw rugs in walkways, pet beds, electrical cords, etc? Yes  Adequate lighting in your home to reduce risk of falls? Yes   ASSISTIVE DEVICES UTILIZED TO PREVENT FALLS:  Life alert? No  Use of a cane, walker or w/c? Yes  Grab bars in the bathroom? No  Shower chair or bench in shower? Yes  Elevated toilet seat or a handicapped toilet? No   TIMED UP AND GO:  Was the test performed? Yes .  Length of time to ambulate 10 feet: 6 sec.   Gait steady and fast with assistive device  Cognitive Function:        12/29/2021    9:39 AM  6CIT Screen  What Year? 0 points  What month? 0 points  What time? 0 points  Count back from 20 0 points  Months in reverse 0 points  Repeat phrase 0 points  Total Score 0 points    Immunizations Immunization History  Administered Date(s) Administered   Influenza Split 03/01/2011   Influenza Whole 02/18/2010   Influenza,inj,Quad PF,6+ Mos 07/18/2015, 02/03/2016, 01/22/2018, 02/12/2019, 02/09/2020, 02/10/2021   Influenza-Unspecified 02/18/2017   PFIZER(Purple Top)SARS-COV-2 Vaccination 07/30/2019, 08/24/2019   Pneumococcal Polysaccharide-23 07/18/2016   Tdap 10/06/2015   Zoster, Live 06/07/2011    TDAP status: Up to date  Flu Vaccine status: Up to  date  Pneumococcal vaccine status: Due, Education has been provided regarding the importance of this vaccine. Advised may receive this vaccine at local pharmacy or Health Dept. Aware to provide a copy of the vaccination record if obtained from local pharmacy or Health Dept. Verbalized acceptance and understanding.  Covid-19 vaccine status: Completed vaccines  Qualifies for Shingles Vaccine? Yes   Zostavax completed No   Shingrix Completed?: No.    Education has been provided regarding the importance of this vaccine. Patient has been advised to call insurance company to determine out of pocket expense if they have not yet received this vaccine. Advised may also receive vaccine at local pharmacy or Health Dept. Verbalized acceptance and understanding.  Screening Tests Health Maintenance  Topic Date Due   OPHTHALMOLOGY EXAM  06/26/2019   COVID-19 Vaccine (3 - Pfizer series) 10/19/2019   INFLUENZA VACCINE  12/19/2021   Pneumonia Vaccine 77+ Years old (2 - PCV) 07/01/2022 (Originally 08/12/2021)   Zoster Vaccines- Shingrix (1 of 2) 07/01/2022 (Originally 08/13/2006)   HEMOGLOBIN A1C  04/15/2022   FOOT EXAM  07/22/2022   Diabetic kidney evaluation - GFR measurement  08/11/2022   Diabetic kidney evaluation - Urine ACR  08/11/2022   MAMMOGRAM  10/05/2022   TETANUS/TDAP  10/05/2025   COLONOSCOPY (Pts 45-40yr Insurance coverage will need to be confirmed)  01/22/2026   PAP SMEAR-Modifier  10/28/2026   DEXA SCAN  Completed   Hepatitis C Screening  Completed   HIV Screening  Completed   HPV VACCINES  Aged Out    Health Maintenance  Health Maintenance Due  Topic Date Due   OPHTHALMOLOGY EXAM  06/26/2019   COVID-19 Vaccine (3 - Pfizer series) 10/19/2019   INFLUENZA VACCINE  12/19/2021    Colorectal cancer screening: Type of screening: Colonoscopy. Completed 01/23/2019. Repeat every 7 years  Mammogram status: Completed 2. Repeat every year  Bone Density status: Completed 10/18/2020.  Results reflect: Bone density results: OSTEOPENIA. Repeat every unknown years.  Lung Cancer Screening: (Low Dose  CT Chest recommended if Age 70-80 years, 30 pack-year currently smoking OR have quit w/in 15years.) does not qualify.   Lung Cancer Screening Referral: n/a  Additional Screening:  Hepatitis C Screening: does qualify; Completed 01/10/2016  Vision Screening: Recommended annual ophthalmology exams for early detection of glaucoma and other disorders of the eye. Is the patient up to date with their annual eye exam?  Yes  Who is the provider or what is the name of the office in which the patient attends annual eye exams? Dr. Luretha Rued If pt is not established with a provider, would they like to be referred to a provider to establish care? No .   Dental Screening: Recommended annual dental exams for proper oral hygiene  Community Resource Referral / Chronic Care Management: CRR required this visit?  No   CCM required this visit?  No      Plan:     I have personally reviewed and noted the following in the patient's chart:   Medical and social history Use of alcohol, tobacco or illicit drugs  Current medications and supplements including opioid prescriptions. Patient is not currently taking opioid prescriptions. Functional ability and status Nutritional status Physical activity Advanced directives List of other physicians Hospitalizations, surgeries, and ER visits in previous 12 months Vitals Screenings to include cognitive, depression, and falls Referrals and appointments  In addition, I have reviewed and discussed with patient certain preventive protocols, quality metrics, and best practice recommendations. A written personalized care plan for preventive services as well as general preventive health recommendations were provided to patient.     Rebekah Kennedy, [place]   12/29/2021   Nurse Notes:  Rebekah Kennedy , Thank you for taking time to come for your Medicare  Wellness Visit. I appreciate your ongoing commitment to your health goals. Please review the following plan we discussed and let me know if I can assist you in the future.   These are the goals we discussed:  Goals       Diabetes Patient stated goal (pt-stated)      Patient wants to get her A1c down to appropriate level and to improve her balance and gait         This is a list of the screening recommended for you and due dates:  Health Maintenance  Topic Date Due   Eye exam for diabetics  06/26/2019   COVID-19 Vaccine (3 - Pfizer series) 10/19/2019   Flu Shot  12/19/2021   Pneumonia Vaccine (2 - PCV) 07/01/2022*   Zoster (Shingles) Vaccine (1 of 2) 07/01/2022*   Hemoglobin A1C  04/15/2022   Complete foot exam   07/22/2022   Yearly kidney function blood test for diabetes  08/11/2022   Yearly kidney health urinalysis for diabetes  08/11/2022   Mammogram  10/05/2022   Tetanus Vaccine  10/05/2025   Colon Cancer Screening  01/22/2026   Pap Smear  10/28/2026   DEXA scan (bone density measurement)  Completed   Hepatitis C Screening: USPSTF Recommendation to screen - Ages 59-79 yo.  Completed   HIV Screening  Completed   HPV Vaccine  Aged Out  *Topic was postponed. The date shown is not the original due date.

## 2021-12-29 NOTE — Patient Instructions (Signed)

## 2022-01-01 DIAGNOSIS — H35033 Hypertensive retinopathy, bilateral: Secondary | ICD-10-CM | POA: Diagnosis not present

## 2022-01-01 DIAGNOSIS — E113512 Type 2 diabetes mellitus with proliferative diabetic retinopathy with macular edema, left eye: Secondary | ICD-10-CM | POA: Diagnosis not present

## 2022-01-01 DIAGNOSIS — H4312 Vitreous hemorrhage, left eye: Secondary | ICD-10-CM | POA: Diagnosis not present

## 2022-01-01 DIAGNOSIS — E113311 Type 2 diabetes mellitus with moderate nonproliferative diabetic retinopathy with macular edema, right eye: Secondary | ICD-10-CM | POA: Diagnosis not present

## 2022-01-02 DIAGNOSIS — R531 Weakness: Secondary | ICD-10-CM | POA: Diagnosis not present

## 2022-01-02 DIAGNOSIS — R2689 Other abnormalities of gait and mobility: Secondary | ICD-10-CM | POA: Diagnosis not present

## 2022-01-02 DIAGNOSIS — R2681 Unsteadiness on feet: Secondary | ICD-10-CM | POA: Diagnosis not present

## 2022-01-04 DIAGNOSIS — R531 Weakness: Secondary | ICD-10-CM | POA: Diagnosis not present

## 2022-01-04 DIAGNOSIS — R2681 Unsteadiness on feet: Secondary | ICD-10-CM | POA: Diagnosis not present

## 2022-01-04 DIAGNOSIS — R2689 Other abnormalities of gait and mobility: Secondary | ICD-10-CM | POA: Diagnosis not present

## 2022-01-08 ENCOUNTER — Telehealth: Payer: Self-pay | Admitting: Internal Medicine

## 2022-01-08 NOTE — Telephone Encounter (Signed)
Form placed on Dr. Quay Burow to sign.

## 2022-01-08 NOTE — Telephone Encounter (Signed)
For our records: We have received surgical clearance PW from [facility] for the pt, and the PW has been placed in both of [facility] boxes.   Please sign and fax back ASAP, pt is scheduled for surgery this coming Wednesday 8.23.23

## 2022-01-09 ENCOUNTER — Encounter: Payer: Self-pay | Admitting: Internal Medicine

## 2022-01-09 DIAGNOSIS — R531 Weakness: Secondary | ICD-10-CM | POA: Diagnosis not present

## 2022-01-09 DIAGNOSIS — R2681 Unsteadiness on feet: Secondary | ICD-10-CM | POA: Diagnosis not present

## 2022-01-09 DIAGNOSIS — R2689 Other abnormalities of gait and mobility: Secondary | ICD-10-CM | POA: Diagnosis not present

## 2022-01-09 NOTE — Telephone Encounter (Signed)
Letter faxed this morning for clearance.

## 2022-01-10 DIAGNOSIS — E113592 Type 2 diabetes mellitus with proliferative diabetic retinopathy without macular edema, left eye: Secondary | ICD-10-CM | POA: Diagnosis not present

## 2022-01-10 DIAGNOSIS — H4312 Vitreous hemorrhage, left eye: Secondary | ICD-10-CM | POA: Diagnosis not present

## 2022-01-18 DIAGNOSIS — E113512 Type 2 diabetes mellitus with proliferative diabetic retinopathy with macular edema, left eye: Secondary | ICD-10-CM | POA: Diagnosis not present

## 2022-01-18 DIAGNOSIS — E113391 Type 2 diabetes mellitus with moderate nonproliferative diabetic retinopathy without macular edema, right eye: Secondary | ICD-10-CM | POA: Diagnosis not present

## 2022-01-18 DIAGNOSIS — H3582 Retinal ischemia: Secondary | ICD-10-CM | POA: Diagnosis not present

## 2022-01-23 DIAGNOSIS — R2689 Other abnormalities of gait and mobility: Secondary | ICD-10-CM | POA: Diagnosis not present

## 2022-01-23 DIAGNOSIS — R531 Weakness: Secondary | ICD-10-CM | POA: Diagnosis not present

## 2022-01-23 DIAGNOSIS — R2681 Unsteadiness on feet: Secondary | ICD-10-CM | POA: Diagnosis not present

## 2022-01-25 DIAGNOSIS — R2689 Other abnormalities of gait and mobility: Secondary | ICD-10-CM | POA: Diagnosis not present

## 2022-01-25 DIAGNOSIS — R531 Weakness: Secondary | ICD-10-CM | POA: Diagnosis not present

## 2022-01-25 DIAGNOSIS — R2681 Unsteadiness on feet: Secondary | ICD-10-CM | POA: Diagnosis not present

## 2022-02-02 ENCOUNTER — Emergency Department (HOSPITAL_COMMUNITY)
Admission: EM | Admit: 2022-02-02 | Discharge: 2022-02-03 | Disposition: A | Discharge status: Home / Self Care | Payer: Medicare Other | Attending: Emergency Medicine | Admitting: Emergency Medicine

## 2022-02-02 ENCOUNTER — Encounter (HOSPITAL_COMMUNITY): Payer: Self-pay | Admitting: Emergency Medicine

## 2022-02-02 ENCOUNTER — Encounter: Payer: Self-pay | Admitting: Internal Medicine

## 2022-02-02 DIAGNOSIS — R350 Frequency of micturition: Secondary | ICD-10-CM | POA: Diagnosis present

## 2022-02-02 DIAGNOSIS — E1165 Type 2 diabetes mellitus with hyperglycemia: Secondary | ICD-10-CM | POA: Diagnosis not present

## 2022-02-02 DIAGNOSIS — J45909 Unspecified asthma, uncomplicated: Secondary | ICD-10-CM | POA: Insufficient documentation

## 2022-02-02 DIAGNOSIS — N3000 Acute cystitis without hematuria: Secondary | ICD-10-CM | POA: Insufficient documentation

## 2022-02-02 DIAGNOSIS — R739 Hyperglycemia, unspecified: Secondary | ICD-10-CM

## 2022-02-02 DIAGNOSIS — E1140 Type 2 diabetes mellitus with diabetic neuropathy, unspecified: Secondary | ICD-10-CM | POA: Insufficient documentation

## 2022-02-02 DIAGNOSIS — I10 Essential (primary) hypertension: Secondary | ICD-10-CM | POA: Diagnosis not present

## 2022-02-02 DIAGNOSIS — R3589 Other polyuria: Secondary | ICD-10-CM | POA: Insufficient documentation

## 2022-02-02 NOTE — ED Triage Notes (Signed)
Patient reports polyuria x4 days, dysuria since yesterday.

## 2022-02-03 LAB — URINALYSIS, ROUTINE W REFLEX MICROSCOPIC
Bilirubin Urine: NEGATIVE
Glucose, UA: >=500 mg/dL — AB
Ketones, ur: 5 mg/dL — AB
Nitrite: NEGATIVE
Protein, ur: 30 mg/dL — AB
Specific Gravity, Urine: 1.024 (ref 1.005–1.030)
WBC, UA: >50 WBC/hpf — ABNORMAL HIGH (ref 0–5)
pH: 5 (ref 5.0–8.0)

## 2022-02-03 LAB — BASIC METABOLIC PANEL
Anion gap: 7 (ref 5–15)
BUN: 24 mg/dL — ABNORMAL HIGH (ref 8–23)
CO2: 25 mmol/L (ref 22–32)
Calcium: 9.1 mg/dL (ref 8.9–10.3)
Chloride: 107 mmol/L (ref 98–111)
Creatinine, Ser: 0.89 mg/dL (ref 0.44–1.00)
GFR, Estimated: >60 mL/min (ref >60)
Glucose, Bld: 314 mg/dL — ABNORMAL HIGH (ref 70–99)
Potassium: 4 mmol/L (ref 3.5–5.1)
Sodium: 139 mmol/L (ref 135–145)

## 2022-02-03 MED ORDER — CEPHALEXIN 500 MG PO CAPS: 500.0000 mg | ORAL_CAPSULE | Freq: Two times a day (BID) | ORAL | 0 refills | Status: AC

## 2022-02-03 MED ORDER — CEPHALEXIN 500 MG PO CAPS
500.0000 mg | ORAL_CAPSULE | Freq: Once | ORAL | Status: AC
  Administered 2022-02-03: 500 mg via ORAL
  Filled 2022-02-03: qty 1

## 2022-02-03 NOTE — Discharge Instructions (Signed)
You were seen in the emergency room today with urine frequency.  I am treating you with antibiotic for possible urine infection.  Your blood sugar is elevated here and I would like for you to stay compliant with your home medication and diet changes.  Please call your primary care physician on Monday for repeat blood sugar checks and to make any medication adjustments as needed.  If you have worsening pain, fevers, vomiting please return to the emergency department for evaluation.

## 2022-02-03 NOTE — ED Provider Notes (Signed)
Emergency Department Provider Note   I have reviewed the triage vital signs and the nursing notes.   HISTORY  Chief Complaint Polyuria   HPI Rebekah Kennedy is a 66 y.o. female past history of diabetes, hypertension, per lipidemia presents emergency department with urine frequency and some mild lower abdominal discomfort.  She does note some mild dysuria but symptoms seem to improve with Motrin.  She is noticing that her blood sugars have been running slightly high on her monitor at home but not severely so.  She has not been having vomiting or abdominal discomfort.  No back pain.  Patient questions if she may have had some mild fever in the past several days but unclear.   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 Respiratory: Denies shortness of breath. Gastrointestinal: No abdominal pain.  No nausea, no vomiting.  No diarrhea.  No  constipation. Genitourinary: Mild dysuria. Positive urine frequency.  Musculoskeletal: Negative for back pain. Skin: Negative for rash. Neurological: Negative for headaches.   ____________________________________________   PHYSICAL EXAM:  VITAL SIGNS: ED Triage Vitals  Enc Vitals Group     BP 02/02/22 2009 110/83     Pulse Rate 02/02/22 2009 (!) 105     Resp 02/02/22 2009 18     Temp 02/02/22 2009 99.8 F (37.7 C)     Temp Source 02/02/22 2009 Oral     SpO2 02/02/22 2009 96 %   Constitutional: Alert and oriented. Well appearing and in no acute distress. Eyes: Conjunctivae are normal.  Head: Atraumatic. Nose: No congestion/rhinnorhea. Mouth/Throat: Mucous membranes are moist. Neck: No stridor.   Cardiovascular: Normal rate, regular rhythm. Good peripheral circulation. Grossly normal heart sounds.   Respiratory: Normal respiratory effort.  No retractions. Lungs CTAB. Gastrointestinal: Soft and nontender. No distention. No CVA tenderness.  Musculoskeletal: No lower extremity tenderness nor edema. No gross deformities of extremities. Neurologic:  Normal speech and language. No gross focal neurologic deficits are appreciated.  Skin:  Skin is warm, dry and intact. No rash noted.  ____________________________________________   LABS (all labs ordered are listed, but only abnormal results are displayed)  Labs Reviewed  URINALYSIS, ROUTINE W REFLEX MICROSCOPIC - Abnormal; Notable for the  following components:      Result Value   APPearance HAZY (*)    Glucose, UA >=500 (*)    Hgb urine dipstick SMALL (*)    Ketones, ur 5 (*)    Protein, ur 30 (*)    Leukocytes,Ua SMALL (*)    WBC, UA >50 (*)    Bacteria, UA MANY (*)    All other components within normal limits  BASIC METABOLIC PANEL - Abnormal; Notable for the following components:   Glucose, Bld 314 (*)    BUN 24 (*)    All other components within normal limits  URINE CULTURE  CBG MONITORING, ED    ____________________________________________   PROCEDURES  Procedure(s) performed:   Procedures  None  ____________________________________________   INITIAL IMPRESSION / ASSESSMENT AND PLAN / ED COURSE  Pertinent labs & imaging results that were available during my care of the patient were reviewed by me and considered in my medical decision making (see chart for details).   This patient is Presenting for Evaluation of urine frequency, which does require a range of treatment options, and is a complaint that involves a moderate risk of morbidity and mortality.  The Differential Diagnoses include cystitis, pyelonephritis, hyperglycemia, DKA, HHS, etc.   I did obtain Additional Historical Information from family at bedside.   Clinical Laboratory Tests Ordered, included hyperglycemia noted without DKA.  Normal electrolytes.  UA shows increased glucose but also many bacteria with greater than 50 whites and leukocytes.  Nitrite negative. Sending urine culture.    Social Determinants of Health Risk patient with no smoking history.   Medical Decision Making: Summary:  Patient presents emergency department with polyuria and mild dysuria.  Plan to screen for UTI will obtain screening chemistry to evaluate for hyperglycemia and rule out DKA but low suspicion for this clinically.  Reevaluation with update and discussion with patient.  Urine frequency likely multifactorial.  Plan to treat for UTI and send urine for culture.  Also notable for hyperglycemia.  Patient has not been n.p.o. while in the ED waiting room and notes that she did eat candy just before coming back and having her labs drawn.  She will continue to follow her blood sugars and follow with primary care physician regarding ongoing management and medication changes as needed.  Disposition: discharge  ____________________________________________  FINAL CLINICAL IMPRESSION(S) / ED DIAGNOSES  Final diagnoses:  Polyuria   Acute cystitis without hematuria  Hyperglycemia     NEW OUTPATIENT MEDICATIONS STARTED DURING THIS VISIT:  New Prescriptions   CEPHALEXIN (KEFLEX) 500 MG CAPSULE    Take 1 capsule (500 mg total) by mouth 2 (two) times daily for 7 days.    Note:  This document was prepared using Dragon voice recognition software and may include unintentional dictation errors.  Nanda Quinton, MD, [facility] Emergency Medicine    Cindee Mclester, [facility], MD 02/03/22 [phone]

## 2022-02-05 LAB — URINE CULTURE: Culture: >=100000 — AB

## 2022-02-06 DIAGNOSIS — R2681 Unsteadiness on feet: Secondary | ICD-10-CM | POA: Diagnosis not present

## 2022-02-06 DIAGNOSIS — R2689 Other abnormalities of gait and mobility: Secondary | ICD-10-CM | POA: Diagnosis not present

## 2022-02-06 DIAGNOSIS — R531 Weakness: Secondary | ICD-10-CM | POA: Diagnosis not present

## 2022-02-06 DIAGNOSIS — N3000 Acute cystitis without hematuria: Secondary | ICD-10-CM | POA: Insufficient documentation

## 2022-02-06 NOTE — Progress Notes (Unsigned)
Subjective:    Patient ID: Rebekah Kennedy, female    DOB: Jun 19, 1955, 66 y.o.   MRN: [id number]     HPI Gilda is here for follow up from the emergency room.  02/02/2022-ED for polyuria-diagnosed with acute cystitis without hematuria.  She presented to the ED with urinary frequency and some mild lower abdominal discomfort.  She was unsure if she had a fever.  Symptoms improved with Motrin.  Sugars had been higher than normal at home.  She denies any nausea, vomiting or back pain.  UA was consistent with a UTI.  Glucose in ED was 314.  Kidney function normal.  She was discharged home on Keflex 500 mg twice daily x1 week.  Urine culture subsequently showed E. Coli > 100,000 colonies per mL, pansensitive.  She is just started seeing Dr. Cruzita Lederer for her diabetes.  Medications and allergies reviewed with patient and updated if appropriate.  Current Outpatient Medications on File Prior to Visit  Medication Sig Dispense Refill   albuterol (PROVENTIL HFA;VENTOLIN HFA) 108 (90 Base) MCG/ACT inhaler Inhale 2 puffs into the lungs every 4 (four) hours as needed for wheezing or shortness of breath. 18 g 2   amLODipine (NORVASC) 5 MG tablet TAKE 1 TABLET BY MOUTH EVERY DAY 90 tablet 1   ammonium lactate (AMLACTIN) 12 % lotion Apply 1 application topically as needed for dry skin. 400 g 5   aspirin EC 81 MG EC tablet Take 1 tablet (81 mg total) by mouth daily. 30 tablet 0   Biotin 5000 MCG TABS Take 10,000 mcg by mouth daily.      calcium-vitamin D (OSCAL WITH D) 500-200 MG-UNIT tablet Take 1 tablet by mouth daily.     cephALEXin (KEFLEX) 500 MG capsule Take 1 capsule (500 mg total) by mouth 2 (two) times daily for 7 days. 14 capsule 0   Cholecalciferol (VITAMIN D3 PO) Take 2,000 Units by mouth daily.     clopidogrel (PLAVIX) 75 MG tablet TAKE 1 TABLET BY MOUTH EVERY DAY 90 tablet 1   Continuous Blood Gluc Receiver (FREESTYLE LIBRE 14 DAY READER) DEVI USE AS DIRECTED TO MONITOR SUGARS. DX E11.51  (Patient taking differently: USE AS DIRECTED TO MONITOR SUGARS. DX E11.51 Left upper arm) 1 each 0   Continuous Blood Gluc Receiver (FREESTYLE LIBRE 2 READER) DEVI Use as directed to check blood sugar 1 each 1   Continuous Blood Gluc Sensor (FREESTYLE LIBRE 2 SENSOR) MISC USE AS DIRECTED EVERY 2 WEEKS TO MONITOR BLOOD SUGARS AS INSTRUCTED 2 each 3   dapagliflozin propanediol (FARXIGA) 10 MG TABS tablet Take 1 tablet (10 mg total) by mouth daily before breakfast. 90 tablet 3   FARXIGA 5 MG TABS tablet TAKE 1 TABLET BY MOUTH DAILY BEFORE BREAKFAST. (Patient not taking: Reported on 12/29/2021) 90 tablet 1   glucose blood (FREESTYLE PRECISION NEO TEST) test strip Use as instructed 100 each 12   hydrALAZINE (APRESOLINE) 25 MG tablet TAKE 1 TABLET BY MOUTH THREE TIMES A DAY 270 tablet 1   losartan (COZAAR) 100 MG tablet TAKE 1 TABLET BY MOUTH EVERY DAY 90 tablet 1   metoprolol succinate (TOPROL-XL) 100 MG 24 hr tablet TAKE 1 TABLET BY MOUTH DAILY WITH OR IMMEDIATELY FOLLOWING A MEAL 90 tablet 1   montelukast (SINGULAIR) 10 MG tablet TAKE 1 TABLET BY MOUTH EVERY DAY 90 tablet 1   nystatin-triamcinolone ointment (MYCOLOG) Apply topically as needed. (Patient taking differently: Apply 1 Application topically daily as needed (itching).) 30  g 4   OVER THE COUNTER MEDICATION Take 1 tablet by mouth daily. Multivitamin centrum 1 daily     rosuvastatin (CRESTOR) 10 MG tablet TAKE 1 TABLET BY MOUTH EVERY DAY 90 tablet 3   Semaglutide (RYBELSUS) 14 MG TABS Take 1 tablet (14 mg total) by mouth daily before breakfast. 90 tablet 1   tacrolimus (PROTOPIC) 0.1 % ointment Apply topically 2 (two) times daily. (Patient taking differently: Apply 1 Application topically daily as needed (blisters).) 30 g 2   No current facility-administered medications on file prior to visit.     Review of Systems     Objective:  There were no vitals filed for this visit. BP Readings from Last 3 Encounters:  02/03/22 100/89  12/29/21  136/84  10/27/21 118/74   Wt Readings from Last 3 Encounters:  12/29/21 177 lb 6 oz (80.5 kg)  10/27/21 176 lb (79.8 kg)  10/13/21 176 lb 12.8 oz (80.2 kg)   There is no height or weight on file to calculate BMI.    Physical Exam     Lab Results  Component Value Date   WBC 7.4 08/10/2021   HGB 13.6 08/10/2021   HCT 42.2 08/10/2021   PLT 254.0 08/10/2021   GLUCOSE 314 (H) 02/03/2022   CHOL 174 08/10/2021   TRIG 145.0 08/10/2021   HDL 63.90 08/10/2021   LDLDIRECT 126.7 03/24/2013   LDLCALC 81 08/10/2021   ALT 21 08/10/2021   AST 16 08/10/2021   NA 139 02/03/2022   K 4.0 02/03/2022   CL 107 02/03/2022   CREATININE 0.89 02/03/2022   BUN 24 (H) 02/03/2022   CO2 25 02/03/2022   TSH 1.28 08/10/2021   INR 0.96 07/07/2018   HGBA1C 8.7 (A) 10/13/2021   MICROALBUR 14.2 (H) 08/10/2021     Assessment & Plan:    See Problem List for Assessment and Plan of chronic medical problems.

## 2022-02-07 ENCOUNTER — Encounter: Payer: Self-pay | Admitting: Internal Medicine

## 2022-02-07 ENCOUNTER — Ambulatory Visit (INDEPENDENT_AMBULATORY_CARE_PROVIDER_SITE_OTHER): Payer: Medicare Other | Admitting: Internal Medicine

## 2022-02-07 VITALS — BP 120/80 | HR 82 | Temp 98.4°F | Ht 64.75 in | Wt 172.0 lb

## 2022-02-07 DIAGNOSIS — E1165 Type 2 diabetes mellitus with hyperglycemia: Secondary | ICD-10-CM | POA: Diagnosis not present

## 2022-02-07 DIAGNOSIS — E1159 Type 2 diabetes mellitus with other circulatory complications: Secondary | ICD-10-CM | POA: Diagnosis not present

## 2022-02-07 DIAGNOSIS — N3000 Acute cystitis without hematuria: Secondary | ICD-10-CM | POA: Diagnosis not present

## 2022-02-07 DIAGNOSIS — I10 Essential (primary) hypertension: Secondary | ICD-10-CM | POA: Diagnosis not present

## 2022-02-07 NOTE — Assessment & Plan Note (Addendum)
Chronic Following with Dr. Cruzita Lederer Continue taking Rybelsus 14 mg daily and Farxiga 10 mg daily Sugar was very elevated in the ED-shortly before she did eat candy Sugar here during her visit is 172 She is working on her diet-decreasing carbs and increasing fruits and vegetables-trying to become more plant-based Stressed regular exercise Stressed weight loss

## 2022-02-07 NOTE — Assessment & Plan Note (Signed)
Started on Keflex 500 mg twice daily in the emergency room, which she is taking as prescribed Symptoms have improved slightly, but still slightly symptomatic Stressed completing the entire course of antibiotic Call if symptoms do not completely resolve

## 2022-02-07 NOTE — Patient Instructions (Addendum)
    Medications changes include :   none    

## 2022-02-07 NOTE — Assessment & Plan Note (Signed)
Chronic BP well controlled Continue amlodipine 5 mg daily, hydralazine 25 mg twice daily, losartan 100 mg daily, metoprolol XL 100 mg daily

## 2022-02-12 ENCOUNTER — Ambulatory Visit: Payer: Medicare Other | Admitting: Internal Medicine

## 2022-02-13 DIAGNOSIS — R531 Weakness: Secondary | ICD-10-CM | POA: Diagnosis not present

## 2022-02-13 DIAGNOSIS — R2689 Other abnormalities of gait and mobility: Secondary | ICD-10-CM | POA: Diagnosis not present

## 2022-02-13 DIAGNOSIS — R2681 Unsteadiness on feet: Secondary | ICD-10-CM | POA: Diagnosis not present

## 2022-02-20 ENCOUNTER — Ambulatory Visit: Payer: Medicare Other | Admitting: Internal Medicine

## 2022-02-20 ENCOUNTER — Encounter: Payer: Self-pay | Admitting: Internal Medicine

## 2022-02-20 VITALS — BP 144/70 | HR 65 | Ht 66.0 in | Wt 171.2 lb

## 2022-02-20 DIAGNOSIS — E7849 Other hyperlipidemia: Secondary | ICD-10-CM | POA: Diagnosis not present

## 2022-02-20 DIAGNOSIS — E1165 Type 2 diabetes mellitus with hyperglycemia: Secondary | ICD-10-CM | POA: Diagnosis not present

## 2022-02-20 DIAGNOSIS — E1159 Type 2 diabetes mellitus with other circulatory complications: Secondary | ICD-10-CM | POA: Diagnosis not present

## 2022-02-20 LAB — POCT GLYCOSYLATED HEMOGLOBIN (HGB A1C): Hemoglobin A1C: 7.6 % — AB (ref 4.0–5.6)

## 2022-02-20 NOTE — Progress Notes (Signed)
Patient ID: Rebekah Kennedy, female   DOB: 1955-07-21, 66 y.o.   MRN: [id number]  HPI: Rebekah Kennedy is a 66 y.o.-year-old female, returning for follow-up for DM2, dx in 2004, non-insulin-dependent, uncontrolled, with complications (CVA, PDR, CKD, PN, severe hypoglycemia in 2018). Pt. previously saw Dr. Loanne Drilling. Lats OV with me 4 months ago.  Interim history: She is preparing to start a whole food plant based diet  - based on Dr. Nat Christen book. No nausea, chest pain.  She still has some increased urination. She was in the emergency room 02/02/2022 with high cystitis with hematuria. She was on Ciprofloxacin -finished course 1 week ago.  Reviewed HbA1c: Lab Results  Component Value Date   HGBA1C 8.7 (A) 10/13/2021   HGBA1C 8.8 (H) 08/10/2021   HGBA1C 7.4 (H) 02/10/2021   HGBA1C 8.2 (H) 11/09/2020   HGBA1C 8.6 (H) 08/05/2020   HGBA1C 8.0 (H) 04/27/2020   HGBA1C 6.8 (H) 07/24/2019   HGBA1C 7.1 (H) 04/24/2019   HGBA1C 6.7 (H) 10/23/2018   HGBA1C 8.2 (H) 07/08/2018   Pt is on a regimen of: - Januvia >> Rybelsus 14 mg before breakfast - Farxiga 10 >> 5 >> 10 mg before breakfast Metformin caused diarrhea. She was previously on insulin between 2013 and 2019 before her gastric bypass.  Pt checks her sugars >4x a day and they are:   Prev.: - am: 135-140s - 2h after b'fast: n/c - before lunch: n/c - 2h after lunch: 178 - before dinner: n/c - 2h after dinner: n/c - bedtime: n/c - nighttime: n/c Lowest sugar was 100 >> 60s; she has hypoglycemia awareness at 70.  Highest sugar was 300s >> 400.  Glucometer:One Touch Verio  - no CKD, last BUN/creatinine:  Lab Results  Component Value Date   BUN 24 (H) 02/03/2022   BUN 16 08/10/2021   CREATININE 0.89 02/03/2022   CREATININE 0.71 08/10/2021  On losartan 100 mg daily.  -+ HL; last set of lipids: Lab Results  Component Value Date   CHOL 174 08/10/2021   HDL 63.90 08/10/2021   LDLCALC 81 08/10/2021   LDLDIRECT 126.7  03/24/2013   TRIG 145.0 08/10/2021   CHOLHDL 3 08/10/2021  On Crestor 10 mg daily.  - last eye exam was in 2023. + DR. IO injections for macular edema.  - no numbness and tingling in her feet.  Last foot exam July 21, 2021.  On ASA 81.  She had gastric bypass in 2019.  She lost 80 pounds afterwards. She also has a history of HTN, osteopenia, depression, asthma, obstructive sleep apnea. She is a Pharmacist, hospital.  ROS: + see HPI  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 issuse resolved  Retinal edema    gets steriod inj in the eyes     Sleep apnea    not needed since 100 pound weight loss 2019-2020   Splenomegaly    in college   Stroke (cerebrum) ([place]) 06/2018   left side pt states, left wide weak per pt   Stroke [facility] 2012   right pontine cva with left hemiparesis   Past Surgical History:  Procedure Laterality Date   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   APPENDECTOMY     yrs ago per pt 0n 11-29-2020   BIOPSY THYROID  05/02/2011   Nonneoplastic goiter   BREAST BIOPSY     BREAST LUMPECTOMY WITH RADIOACTIVE SEED LOCALIZATION Left 07/25/2017   Procedure: LEFT BREAST LUMPECTOMY WITH RADIOACTIVE SEED LOCALIZATION;  Surgeon: Coralie Keens, MD;  Location: [facility];  Service: General;  Laterality: Left;   BREAST SURGERY     Reduction   cataracts Bilateral  2012   COLONOSCOPY  2021   COLPOSCOPY     DILATATION & CURETTAGE/HYSTEROSCOPY WITH MYOSURE N/A 12/02/2020   Procedure: [place];  Surgeon: [name], MD;  Location: [facility];  Service: Gynecology;  Laterality: N/A;   [place]   DUB   EYE SURGERY     Laser for vision at times both eyes done   GASTRIC ROUX-EN-Y N/A 08/12/2017   Procedure: LAPAROSCOPIC ROUX-EN-Y GASTRIC BYPASS WITH HIATAL HERNIA REPAIR AND UPPER ENDOSCOPY;  Surgeon: [name], [name], MD;  Location: [facility];  Service: General;  Laterality: N/A;   GYNECOLOGIC CRYOSURGERY     MYOMECTOMY     OVARIAN CYST REMOVAL     PELVIC LAPAROSCOPY     DL lysis of adhesions 1975 and 1988   Social History   Socioeconomic History   Marital status: Single    Spouse name: Not on file   Number of children: 2   Years of education: 16   Highest education level: Bachelor's degree (e.g., BA, AB, BS)  Occupational History   Occupation: TEACHER    Employer: [employer]  Tobacco Use   Smoking status: Never   Smokeless tobacco: Never  Vaping Use   Vaping Use: Never used  Substance and Sexual Activity   Alcohol use: Not Currently    Alcohol/week: 0.0 standard drinks of alcohol   Drug use: Never   Sexual activity: Not Currently    Birth control/protection: Post-menopausal    Comment: older than 17, less than 5  Other Topics Concern   Not on file  Social History Narrative   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   Living with daughter      Right handed      4 steps inside of the home      Social Determinants of Health   Financial Resource Strain: Low Risk  (12/29/2021)   Overall Financial Resource Strain (CARDIA)    Difficulty of Paying Living Expenses: Not hard at all  Food Insecurity: No Food Insecurity (12/29/2021)   Hunger Vital Sign    Worried About Running Out of  Food in the Last Year: Never true    Ran Out of Food in the Last Year: Never true  Transportation Needs: No Transportation Needs (12/29/2021)   PRAPARE - Hydrologist (Medical): No    Lack of Transportation (Non-Medical): No  Physical Activity: Insufficiently Active (12/29/2021)  Exercise Vital Sign    Days of Exercise per Week: 1 day    Minutes of Exercise per Session: 30 min  Stress: No Stress Concern Present (12/29/2021)   [place]    Feeling of Stress : Not at all  Social Connections: Moderately Isolated (12/29/2021)   Social Connection and Isolation Panel [NHANES]    Frequency of Communication with Friends and Family: More than three times a week    Frequency of Social Gatherings with Friends and Family: More than three times a week    Attends Religious Services: 1 to 4 times per year    Active Member of [employer] or Organizations: No    Attends Archivist Meetings: Never    Marital Status: Never married  Intimate Partner Violence: Not At Risk (12/29/2021)   Humiliation, Afraid, Rape, and Kick questionnaire    Fear of Current or Ex-Partner: No    Emotionally Abused: No    Physically Abused: No    Sexually Abused: No   Current Outpatient Medications on File Prior to Visit  Medication Sig Dispense Refill   albuterol (PROVENTIL HFA;VENTOLIN HFA) 108 (90 Base) MCG/ACT inhaler Inhale 2 puffs into the lungs every 4 (four) hours as needed for wheezing or shortness of breath. 18 g 2   amLODipine (NORVASC) 5 MG tablet TAKE 1 TABLET BY MOUTH EVERY DAY 90 tablet 1   ammonium lactate (AMLACTIN) 12 % lotion Apply 1 application topically as needed for dry skin. 400 g 5   aspirin EC 81 MG EC tablet Take 1 tablet (81 mg total) by mouth daily. 30 tablet 0   Biotin 5000 MCG TABS Take 10,000 mcg by mouth daily.      calcium-vitamin D (OSCAL WITH D) 500-200 MG-UNIT tablet Take 1 tablet by mouth  daily.     Cholecalciferol (VITAMIN D3 PO) Take 2,000 Units by mouth daily.     clopidogrel (PLAVIX) 75 MG tablet TAKE 1 TABLET BY MOUTH EVERY DAY 90 tablet 1   Continuous Blood Gluc Receiver (FREESTYLE LIBRE 14 DAY READER) DEVI USE AS DIRECTED TO MONITOR SUGARS. DX E11.51 (Patient taking differently: USE AS DIRECTED TO MONITOR SUGARS. DX E11.51 Left upper arm) 1 each 0   Continuous Blood Gluc Receiver (FREESTYLE LIBRE 2 READER) DEVI Use as directed to check blood sugar 1 each 1   Continuous Blood Gluc Sensor (FREESTYLE LIBRE 2 SENSOR) MISC USE AS DIRECTED EVERY 2 WEEKS TO MONITOR BLOOD SUGARS AS INSTRUCTED 2 each 3   dapagliflozin propanediol (FARXIGA) 10 MG TABS tablet Take 1 tablet (10 mg total) by mouth daily before breakfast. 90 tablet 3   glucose blood (FREESTYLE PRECISION NEO TEST) test strip Use as instructed 100 each 12   hydrALAZINE (APRESOLINE) 25 MG tablet TAKE 1 TABLET BY MOUTH THREE TIMES A DAY 270 tablet 1   losartan (COZAAR) 100 MG tablet TAKE 1 TABLET BY MOUTH EVERY DAY 90 tablet 1   metoprolol succinate (TOPROL-XL) 100 MG 24 hr tablet TAKE 1 TABLET BY MOUTH DAILY WITH OR IMMEDIATELY FOLLOWING A MEAL 90 tablet 1   montelukast (SINGULAIR) 10 MG tablet TAKE 1 TABLET BY MOUTH EVERY DAY 90 tablet 1   moxifloxacin (VIGAMOX) 0.5 % ophthalmic solution Place 1 drop into the left eye 4 (four) times daily.     nystatin-triamcinolone ointment (MYCOLOG) Apply topically as needed. (Patient taking differently: Apply 1 Application topically daily as needed (itching).) 30 g 4   OVER THE COUNTER MEDICATION Take  1 tablet by mouth daily. Multivitamin centrum 1 daily     prednisoLONE acetate (PRED FORTE) 1 % ophthalmic suspension Place 1 drop into the left eye 4 (four) times daily.     rosuvastatin (CRESTOR) 10 MG tablet TAKE 1 TABLET BY MOUTH EVERY DAY 90 tablet 3   Semaglutide (RYBELSUS) 14 MG TABS Take 1 tablet (14 mg total) by mouth daily before breakfast. 90 tablet 1   tacrolimus (PROTOPIC) 0.1  % ointment Apply topically 2 (two) times daily. (Patient taking differently: Apply 1 Application topically daily as needed (blisters).) 30 g 2   No current facility-administered medications on file prior to visit.   Allergies  Allergen Reactions   Quinapril Cough   Metformin And Related     Loose stool   Family History  Problem Relation Age of Onset   Cancer Maternal Grandmother        Colon Cancer   Asthma Maternal Grandmother    Colon cancer Maternal Grandmother    Diabetes Father    Heart disease Father    Hypertension Father    Hyperlipidemia Father    Stroke Father        passed at age 35 from stroke   Hypertension Mother    Heart disease Mother    Ovarian cancer Mother    Cancer Mother        Lung cancer   Asthma Mother    COPD Mother    Hyperlipidemia Mother    Diabetes Brother    Kidney disease Brother    Hypertension Brother    Berenice Primas' disease Sister    Diabetes Sister    Breast cancer Sister 70   Graves' disease Paternal Grandmother    Hypertension Paternal Grandmother    Heart disease Paternal Grandmother    Alzheimer's disease Paternal Grandmother    Cancer Maternal Grandfather    Heart failure Brother    Prostate cancer Brother    Diabetes Brother    Asthma Brother    Hypertension Brother    Heart failure Brother    Prostate cancer Brother    Stroke Brother    Hypertension Brother    PE: BP (!) 144/70   Pulse 65   Ht '5\' 6"'$  (1.676 m)   Wt 171 lb 3.2 oz (77.7 kg)   SpO2 98%   BMI 27.63 kg/m  Wt Readings from Last 3 Encounters:  02/20/22 171 lb 3.2 oz (77.7 kg)  02/07/22 172 lb (78 kg)  12/29/21 177 lb 6 oz (80.5 kg)   Constitutional: overweight, in NAD Eyes: EOMI, no exophthalmos ENT: no thyromegaly, no cervical lymphadenopathy Cardiovascular: RRR, No MRG, + pitting edema B legs up to knees Respiratory: CTA B Musculoskeletal: no deformities, strength intact in all 4 Skin: + sparse macular rash on bilateral lower legs Neurological: no  tremor with outstretched hands  ASSESSMENT: 1. DM2, non-insulin-dependent, uncontrolled, with  complications - CVA - 94/8546 - CKD - PDR - PN - severe hypoglycemia in 2018  Component     Latest Ref Rng 10/13/2021  Hemoglobin A1C     4.0 - 5.6 % 8.7 !   Glucose     65 - 99 mg/dL 120 (H)   Glucose Fasting, POC     70 - 99 mg/dL 142 !   Islet Cell Ab     Neg:<1:1  Negative   C-Peptide     0.80 - 3.85 ng/mL 1.92   ZNT8 Antibodies     <15 U/mL <10   Glutamic Acid  Decarb Ab     <5 IU/mL <5     No insulin deficiency or antipancreatic autoimmunity.  2. HL  PLAN:  1. Patient with longstanding, uncontrolled, type 2 diabetes, on oral antidiabetic regimen with SGLT2 inhibitor and GLP-1 receptor agonist, with still poor control.  At last visit, HbA1c was slightly better, but still quite high, at 8.7%.  At that time, she returned after having seen Dr. Loanne Drilling almost 3 years prior.  We increased her Farxiga dose as she was tolerating this well.  We checked her for type 1 diabetes and the investigation was negative for autoimmunity and insulin deficiency. -At last visit she was interested in changing her CGM to a freestyle libre 3 CGM.  However, at that time, this was not covered by Medicare CGM interpretation: -At today's visit, we reviewed her CGM downloads: It appears that 60% of values are in target range (goal >70%), while 40% are higher than 180 (goal <25%), and 0% are lower than 70 (goal <4%).  The calculated average blood sugar is 185.   -Reviewing the CGM trends, it appears that her sugars are fluctuating in the upper limit of the target range when she is not eating, but they increase significantly after breakfast after lunch and occasionally after dinner.  Between lunch and dinner her sugars were dropping quite significantly, even under 70s when she was on ciprofloxacin.  We discussed that this can cause some hypoglycemic episodes.  She has finished the course a week ago.  Since then,  the blood sugar drops have not been quite as significant. -We did discuss about intensifying her regimen since her sugars after meals are high, however, she is preparing to start a low-fat plant-based diet so for now I did not suggest to do this.  As she starts the diet, I advised her to stay well-hydrated. -I advised her to let me know if she has another UTI/cystitis, in which case, we may need to stop [place]. - I suggested to:  Patient Instructions  Please continue: - Rybelsus 14 mg before breakfast - Farxiga 10 mg before breakfast  Please return in 4 months.  - we checked her HbA1c: 7.6% (lower) - advised to check sugars at different times of the day - 4x a day, rotating check times - advised for yearly eye exams >> she is UTD - return to clinic in 4 months  2. HL - Reviewed latest lipid panel from 07/2021: LDL above our target of less than 55 due to history of cardiovascular disease: Lab Results  Component Value Date   CHOL 174 08/10/2021   HDL 63.90 08/10/2021   LDLCALC 81 08/10/2021   LDLDIRECT 126.7 03/24/2013   TRIG 145.0 08/10/2021   CHOLHDL 3 08/10/2021  - Continues Crestor 10 mg daily without side effects. - Expect the cholesterol to improve further with improved diabetes control and especially on the whole food plant-based diet  Philemon Kingdom, MD PhD [facility] Endocrinology

## 2022-02-20 NOTE — Addendum Note (Signed)
Addended by: Cinda Quest on: 02/20/2022 02:59 PM   Modules accepted: Orders

## 2022-02-20 NOTE — Patient Instructions (Addendum)
Please continue: - Rybelsus 14 mg before breakfast - Farxiga 10 mg before breakfast  You can start the plant based diet - stay well hydrated.  Please return in 4 months.

## 2022-02-27 DIAGNOSIS — R2681 Unsteadiness on feet: Secondary | ICD-10-CM | POA: Diagnosis not present

## 2022-02-27 DIAGNOSIS — R531 Weakness: Secondary | ICD-10-CM | POA: Diagnosis not present

## 2022-02-27 DIAGNOSIS — R2689 Other abnormalities of gait and mobility: Secondary | ICD-10-CM | POA: Diagnosis not present

## 2022-03-12 ENCOUNTER — Ambulatory Visit: Payer: Medicare Other | Admitting: Neurology

## 2022-03-12 ENCOUNTER — Encounter: Payer: Self-pay | Admitting: Neurology

## 2022-03-12 VITALS — BP 157/88 | HR 77 | Ht 66.0 in | Wt 167.0 lb

## 2022-03-12 DIAGNOSIS — E1142 Type 2 diabetes mellitus with diabetic polyneuropathy: Secondary | ICD-10-CM

## 2022-03-12 DIAGNOSIS — I6381 Other cerebral infarction due to occlusion or stenosis of small artery: Secondary | ICD-10-CM | POA: Diagnosis not present

## 2022-03-12 NOTE — Progress Notes (Signed)
Follow-up Visit   Date: 03/12/22    Rebekah Kennedy MRN: [id number] DOB: Apr 17, 1956   Interim History: Rebekah Kennedy is a 66 y.o. right-handed African American female with history of uncontrolled diabetes mellitus (dx 2008, HbA1c 8.2), hyperlipidemia, hypertension, depression, and right pontine stroke (2012, mild left residual weakness) returning to the clinic with recent left thalamic stroke (06/2018) and right foot drop.  The patient was accompanied to the clinic by self.  History of present illness: UPDATE 12/27/2017:  She is here with new complaints of right >> left foot numbness over the top of the foot and toes, which started in June.  Symptoms are constant without identifiable triggers.  She has also noticed weakness with extending her toe on the right and slight dragging of the foot.  She does not have weakness of the left foot, but has noticed intermittent numbness.  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  Imaging did not show impingement of the L5 nerve, which would explain her foot drop.  She continues to have foot weakness and frequently trips.  She has started to use a cane for balance, which helps.  UPDATE 07/21/2018:  She suffered a left thalamic stroke on 2/17 manifesting with right numbness/tingling, dizziness, imbalance and fall.  She was started on aspirin 81 mg and Plavix 75 mg daily.  Over the past 2 weeks, she has noticed improved balance, but continues to have lack of coordination of the right hand.  She has been using a walker and is working with physical therapy.  Her right foot weakness has improved and she has been able to walk without using her AFO.  She continues to have some numbness and tingling of the right lower extremity.  UPDATE 02/09/2019:  She is here for follow-up.  She is doing well and feels that her gait and  coordination has improved.  She is walking without AFO and uses a cane for stability.  Fortunately, she has not suffered any falls. Her blood pressure and diabetes is under much better control.  She will be retiring October 1st and has been on disability since her stroke.  Numbness and tingling remains stable in the feet and fingertips.  UPDATE 03/12/2022:  She is here for follow-up visit.  She is doing well and has been trying to get more healthy.  She is on a plant-based diet and lost 7lb.  She continues to have intermittent tremor of the right hand nad lack of coordination, which is stable.  Balance is stable, assisted with cane.  No interval TIA, stroke, or new neurological complaints.    Medications:  Current Outpatient Medications on File Prior to Visit  Medication Sig Dispense Refill   albuterol (PROVENTIL HFA;VENTOLIN HFA) 108 (90 Base) MCG/ACT inhaler Inhale 2 puffs into the lungs every 4 (four) hours as needed for wheezing or shortness of breath. 18 g 2   amLODipine (NORVASC) 5 MG tablet TAKE 1 TABLET BY MOUTH EVERY DAY 90 tablet 1   ammonium lactate (AMLACTIN) 12 % lotion Apply 1 application topically as needed for dry skin. 400 g 5   aspirin EC 81 MG EC tablet Take 1 tablet (81 mg total) by mouth daily. 30 tablet 0   Biotin 5000 MCG TABS Take 10,000 mcg by mouth daily.      calcium-vitamin D (OSCAL WITH D) 500-200 MG-UNIT tablet  Take 1 tablet by mouth daily.     Cholecalciferol (VITAMIN D3 PO) Take 2,000 Units by mouth daily.     clopidogrel (PLAVIX) 75 MG tablet TAKE 1 TABLET BY MOUTH EVERY DAY 90 tablet 1   Continuous Blood Gluc Receiver (FREESTYLE LIBRE 14 DAY READER) DEVI USE AS DIRECTED TO MONITOR SUGARS. DX E11.51 (Patient taking differently: USE AS DIRECTED TO MONITOR SUGARS. DX E11.51 Left upper arm) 1 each 0   Continuous Blood Gluc Receiver (FREESTYLE LIBRE 2 READER) DEVI Use as directed to check blood sugar 1 each 1   Continuous Blood Gluc Sensor (FREESTYLE LIBRE 2 SENSOR)  MISC USE AS DIRECTED EVERY 2 WEEKS TO MONITOR BLOOD SUGARS AS INSTRUCTED 2 each 3   dapagliflozin propanediol (FARXIGA) 10 MG TABS tablet Take 1 tablet (10 mg total) by mouth daily before breakfast. 90 tablet 3   glucose blood (FREESTYLE PRECISION NEO TEST) test strip Use as instructed 100 each 12   hydrALAZINE (APRESOLINE) 25 MG tablet TAKE 1 TABLET BY MOUTH THREE TIMES A DAY 270 tablet 1   losartan (COZAAR) 100 MG tablet TAKE 1 TABLET BY MOUTH EVERY DAY 90 tablet 1   metoprolol succinate (TOPROL-XL) 100 MG 24 hr tablet TAKE 1 TABLET BY MOUTH DAILY WITH OR IMMEDIATELY FOLLOWING A MEAL 90 tablet 1   montelukast (SINGULAIR) 10 MG tablet TAKE 1 TABLET BY MOUTH EVERY DAY 90 tablet 1   nystatin-triamcinolone ointment (MYCOLOG) Apply topically as needed. (Patient taking differently: Apply 1 Application topically daily as needed (itching).) 30 g 4   OVER THE COUNTER MEDICATION Take 1 tablet by mouth daily. Multivitamin centrum 1 daily     prednisoLONE acetate (PRED FORTE) 1 % ophthalmic suspension Place 1 drop into the left eye 4 (four) times daily.     rosuvastatin (CRESTOR) 10 MG tablet TAKE 1 TABLET BY MOUTH EVERY DAY 90 tablet 3   Semaglutide (RYBELSUS) 14 MG TABS Take 1 tablet (14 mg total) by mouth daily before breakfast. 90 tablet 1   tacrolimus (PROTOPIC) 0.1 % ointment Apply topically 2 (two) times daily. (Patient taking differently: Apply 1 Application topically daily as needed (blisters).) 30 g 2   No current facility-administered medications on file prior to visit.    Allergies:  Allergies  Allergen Reactions   Quinapril Cough   Metformin And Related     Loose stool     Vital Signs:  BP (!) 157/88   Pulse 77   Ht '5\' 6"'$  (1.676 m)   Wt 167 lb (75.8 kg)   SpO2 100%   BMI 26.95 kg/m   Neurological Exam: MENTAL STATUS including orientation to time, place, person, recent and remote memory, attention span and concentration, language, and fund of knowledge is normal.  Speech is not  dysarthric.  CRANIAL NERVES:  Visual fields intact.  Extraocular muscles intact. No ptosis.  Face is symmetric.    MOTOR:  No atrophy, no fasciculations or abnormal movements.  No pronator drift.  Tone is normal.   Right Upper Extremity:    Left Upper Extremity:    Deltoid  5/5   Deltoid  5/5   Biceps  5/5   Biceps  5/5   Triceps  5/5   Triceps  5/5   Finger extensors  5/5   Finger extensors  5/5   Finger flexors  5/5   Finger flexors  5/5   Dorsal interossei  5/5   Dorsal interossei  5/5   Abductor pollicis  5/5   Abductor pollicis  5/5   Tone (Ashworth scale)  0  Tone (Ashworth scale)  0   Right Lower Extremity:    Left Lower Extremity:    Hip flexors  5/5   Hip flexors  5/5   Dorsiflexors  5/5   Dorsiflexors  5/5   Plantarflexors  5/5   Plantarflexors  5/5   Eversion 5/5  Eversion 5/5  Inversion 5/5  Inversion 5/5  Toe extensors  5/5   Toe extensors  5/5   Toe flexors  5/5   Toe flexors  5/5   Tone (Ashworth scale)  0  Tone (Ashworth scale)  0   SENSATION:  Reduced vibration in the feet  COORDINATION/GAIT:  Trace dysmetria with finger to nose testing on the right.  Intact rapid alternating movements bilaterally.  She is able to walk unassisted, slight unsteadiness which is improved with using a cane.   Data: Lab Results  Component Value Date   CHOL 174 08/10/2021   HDL 63.90 08/10/2021   LDLCALC 81 08/10/2021   LDLDIRECT 126.7 03/24/2013   TRIG 145.0 08/10/2021   CHOLHDL 3 08/10/2021   Lab Results  Component Value Date   HGBA1C 7.6 (A) 02/20/2022   MRI brain 04/2011: 1. Acute lacunar type infarcts in the right mid brain and pons. No mass effect or hemorrhage.   2. Superimposed chronic lacunar infarct in the right paracentral pons. These findings indicate acute on chronic small vessel ischemia.   3. Otherwise mild for age nonspecific cerebral white matter signal changes.   4. Intracranial MRA findings are below.  MRA brain 04/2011: 1. Dolichoectasia of the  posterior circulation without associated stenosis. No major branch occlusion. There is irregularity suggesting atherosclerosis in the right proximal PCA.   2. Mild anterior circulation atherosclerosis. No stenosis or major branch occlusion.  MRI lumbar spine 02/28/2018: 1. Mild disc bulging and facet hypertrophy at L3-4, L4-5, and L5-S1 contributes to mild foraminal narrowing. 2. No other significant stenosis is evident to explain foot drop.  MRI/A brain wo contrast 07/07/2018:  Small area of acute infarction left anterior thalamus.  Mild chronic microvascular ischemic changes in the white matter.  Intracranial atherosclerotic disease as above. Negative for emergent large vessel occlusion.  TTE 07/08/2018:  EF 60-65%, no PFO Lab Results  Component Value Date   CHOL 174 08/10/2021   HDL 63.90 08/10/2021   LDLCALC 81 08/10/2021   LDLDIRECT 126.7 03/24/2013   TRIG 145.0 08/10/2021   CHOLHDL 3 08/10/2021   Lab Results  Component Value Date   HGBA1C 7.6 (A) 02/20/2022     IMPRESSION/PLAN: Multifactorial gait instability, stable.  - Continue to use a cane - Fall precautions discussed  2.  Cerebrovascular disease with history of R pontine stroke (2012) and left thalamic stroke with R side ataxia  - Continue ASA '81mg'$  + plavix '75mg'$  daily  - Continue Crestor '10mg'$  (LDL 81)  - Diabetes and hypertension management per primary, BP higher today because she did not take AM medications   3.  Diabetic polyneuropathy (stable), R peroneal mononeuropathy (improved)  - Continue to optimize diabetes  Return to clinic in 1 year   Thank you for allowing me to participate in patient's care.  If I can answer any additional questions, I would be pleased to do so.    Sincerely,    Joshuwa Vecchio K. Posey Pronto, DO

## 2022-03-13 DIAGNOSIS — R531 Weakness: Secondary | ICD-10-CM | POA: Diagnosis not present

## 2022-03-13 DIAGNOSIS — R2689 Other abnormalities of gait and mobility: Secondary | ICD-10-CM | POA: Diagnosis not present

## 2022-03-13 DIAGNOSIS — R2681 Unsteadiness on feet: Secondary | ICD-10-CM | POA: Diagnosis not present

## 2022-03-16 ENCOUNTER — Other Ambulatory Visit: Payer: Self-pay | Admitting: Internal Medicine

## 2022-03-20 DIAGNOSIS — R2689 Other abnormalities of gait and mobility: Secondary | ICD-10-CM | POA: Diagnosis not present

## 2022-03-20 DIAGNOSIS — R2681 Unsteadiness on feet: Secondary | ICD-10-CM | POA: Diagnosis not present

## 2022-03-20 DIAGNOSIS — R531 Weakness: Secondary | ICD-10-CM | POA: Diagnosis not present

## 2022-03-30 DIAGNOSIS — R531 Weakness: Secondary | ICD-10-CM | POA: Diagnosis not present

## 2022-03-30 DIAGNOSIS — R2681 Unsteadiness on feet: Secondary | ICD-10-CM | POA: Diagnosis not present

## 2022-03-30 DIAGNOSIS — R2689 Other abnormalities of gait and mobility: Secondary | ICD-10-CM | POA: Diagnosis not present

## 2022-04-03 DIAGNOSIS — R2681 Unsteadiness on feet: Secondary | ICD-10-CM | POA: Diagnosis not present

## 2022-04-03 DIAGNOSIS — R2689 Other abnormalities of gait and mobility: Secondary | ICD-10-CM | POA: Diagnosis not present

## 2022-04-03 DIAGNOSIS — R531 Weakness: Secondary | ICD-10-CM | POA: Diagnosis not present

## 2022-04-10 DIAGNOSIS — R2681 Unsteadiness on feet: Secondary | ICD-10-CM | POA: Diagnosis not present

## 2022-04-10 DIAGNOSIS — R531 Weakness: Secondary | ICD-10-CM | POA: Diagnosis not present

## 2022-04-10 DIAGNOSIS — R2689 Other abnormalities of gait and mobility: Secondary | ICD-10-CM | POA: Diagnosis not present

## 2022-04-24 DIAGNOSIS — R2681 Unsteadiness on feet: Secondary | ICD-10-CM | POA: Diagnosis not present

## 2022-04-24 DIAGNOSIS — R531 Weakness: Secondary | ICD-10-CM | POA: Diagnosis not present

## 2022-04-24 DIAGNOSIS — R2689 Other abnormalities of gait and mobility: Secondary | ICD-10-CM | POA: Diagnosis not present

## 2022-05-01 DIAGNOSIS — R531 Weakness: Secondary | ICD-10-CM | POA: Diagnosis not present

## 2022-05-01 DIAGNOSIS — R2689 Other abnormalities of gait and mobility: Secondary | ICD-10-CM | POA: Diagnosis not present

## 2022-05-01 DIAGNOSIS — R2681 Unsteadiness on feet: Secondary | ICD-10-CM | POA: Diagnosis not present

## 2022-05-03 ENCOUNTER — Other Ambulatory Visit: Payer: Self-pay | Admitting: Internal Medicine

## 2022-05-08 ENCOUNTER — Other Ambulatory Visit: Payer: Self-pay | Admitting: Internal Medicine

## 2022-05-08 DIAGNOSIS — H524 Presbyopia: Secondary | ICD-10-CM | POA: Diagnosis not present

## 2022-06-27 ENCOUNTER — Ambulatory Visit (INDEPENDENT_AMBULATORY_CARE_PROVIDER_SITE_OTHER): Payer: Medicare Other | Admitting: Internal Medicine

## 2022-06-27 ENCOUNTER — Encounter: Payer: Self-pay | Admitting: Internal Medicine

## 2022-06-27 VITALS — BP 128/74 | HR 66 | Ht 66.0 in | Wt 172.4 lb

## 2022-06-27 DIAGNOSIS — E1159 Type 2 diabetes mellitus with other circulatory complications: Secondary | ICD-10-CM

## 2022-06-27 DIAGNOSIS — E1165 Type 2 diabetes mellitus with hyperglycemia: Secondary | ICD-10-CM

## 2022-06-27 DIAGNOSIS — E7849 Other hyperlipidemia: Secondary | ICD-10-CM

## 2022-06-27 LAB — POCT GLYCOSYLATED HEMOGLOBIN (HGB A1C): Hemoglobin A1C: 7.4 % — AB (ref 4.0–5.6)

## 2022-06-27 NOTE — Progress Notes (Signed)
Patient ID: Rebekah Kennedy, female   DOB: 1955/11/24, 67 y.o.   MRN: [id number]  HPI: Rebekah Kennedy is a 67 y.o.-year-old female, returning for follow-up for DM2, dx in 2004, non-insulin-dependent, uncontrolled, with complications (CVA, PDR, CKD, PN, severe hypoglycemia in 2018). Pt. previously saw Dr. Loanne Drilling. Lats OV with me 4 months ago.  Interim history: She started a whole food plant based diet  - based on Dr. Nat Christen book.  Now she relaxed her diet to include some meat but she plans to start a cleansing regimen. No nausea, chest pain.  She still has increased urination.  Reviewed HbA1c: Lab Results  Component Value Date   HGBA1C 7.6 (A) 02/20/2022   HGBA1C 8.7 (A) 10/13/2021   HGBA1C 8.8 (H) 08/10/2021   HGBA1C 7.4 (H) 02/10/2021   HGBA1C 8.2 (H) 11/09/2020   HGBA1C 8.6 (H) 08/05/2020   HGBA1C 8.0 (H) 04/27/2020   HGBA1C 6.8 (H) 07/24/2019   HGBA1C 7.1 (H) 04/24/2019   HGBA1C 6.7 (H) 10/23/2018   Pt is on a regimen of: - Januvia >> Rybelsus 14 mg before breakfast - Farxiga 10 >> 5 >> 10 mg before breakfast Glyburide every day - started by Dr. Quay Burow. Metformin caused diarrhea. She was previously on insulin between 2013 and 2019 before her gastric bypass.  At today's visit, she mentions that she could not use her sensor for the last few weeks as she has not been checking her blood sugars consistently.  Prev.:   Lowest sugar was 100 >> 60s >> 56; she has hypoglycemia awareness at 70.  Highest sugar was 300s >> 400 >> 344.  Glucometer:One Touch Verio  - no CKD, last BUN/creatinine:  Lab Results  Component Value Date   BUN 24 (H) 02/03/2022   BUN 16 08/10/2021   CREATININE 0.89 02/03/2022   CREATININE 0.71 08/10/2021  On losartan 100 mg daily.  -+ HL; last set of lipids: Lab Results  Component Value Date   CHOL 174 08/10/2021   HDL 63.90 08/10/2021   LDLCALC 81 08/10/2021   LDLDIRECT 126.7 03/24/2013   TRIG 145.0 08/10/2021   CHOLHDL 3 08/10/2021  On  Crestor 10 mg daily.  - last eye exam was in 2023. + DR. IO injections for macular edema.  - no numbness and tingling in her feet.  Last foot exam July 21, 2021.  On ASA 81.  She had gastric bypass in 2019.  She lost 80 pounds afterwards. She also has a history of HTN, osteopenia, depression, asthma, obstructive sleep apnea. She is a Pharmacist, hospital.  ROS: + see HPI  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 issuse resolved   Retinal edema    gets steriod inj  in the eyes     Sleep apnea    not needed since 100 pound weight loss 2019-2020   Splenomegaly    in college   Stroke (cerebrum) ([place]) 06/2018   left side pt states, left wide weak per pt   Stroke [facility]) 2012   right pontine cva with left hemiparesis   Past Surgical History:  Procedure Laterality Date   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   APPENDECTOMY     yrs ago per pt 0n 11-29-2020   BIOPSY THYROID  05/02/2011   Nonneoplastic goiter   BREAST BIOPSY     BREAST LUMPECTOMY WITH RADIOACTIVE SEED LOCALIZATION Left 07/25/2017   Procedure: LEFT BREAST LUMPECTOMY WITH RADIOACTIVE SEED LOCALIZATION;  Surgeon: Coralie Keens, MD;  Location: [place];  Service: General;  Laterality: Left;   BREAST SURGERY     Reduction   cataracts Bilateral 2012   COLONOSCOPY  2021   COLPOSCOPY     DILATATION &  CURETTAGE/HYSTEROSCOPY WITH MYOSURE N/A 12/02/2020   Procedure: [place];  Surgeon: [name], MD;  Location: [place];  Service: Gynecology;  Laterality: N/A;   [place]   DUB   EYE SURGERY     Laser for vision at times both eyes done   GASTRIC ROUX-EN-Y N/A 08/12/2017   Procedure: LAPAROSCOPIC ROUX-EN-Y GASTRIC BYPASS WITH HIATAL HERNIA REPAIR AND UPPER ENDOSCOPY;  Surgeon: [name], [name], MD;  Location: [facility];  Service: General;  Laterality: N/A;   GYNECOLOGIC CRYOSURGERY     MYOMECTOMY     OVARIAN CYST REMOVAL     PELVIC LAPAROSCOPY     DL lysis of adhesions 1975 and 1988   Social History   Socioeconomic History   Marital status: Single    Spouse name: Not on file   Number of children: 2   Years of education: 16   Highest education level: Bachelor's degree (e.g., BA, AB, BS)  Occupational History   Occupation: TEACHER    Employer: [employer]  Tobacco Use   Smoking status: Never   Smokeless tobacco: Never  Vaping Use   Vaping Use: Never used  Substance and Sexual Activity   Alcohol use: Not Currently    Alcohol/week: 0.0 standard drinks of alcohol   Drug use: Never   Sexual activity: Not Currently    Birth control/protection: Post-menopausal    Comment: older than 17, less than 5  Other Topics Concern   Not on file  Social History Narrative   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   Living with daughter      Right handed      4 steps inside of the home      Social Determinants of Health   Financial Resource Strain: Low Risk  (12/29/2021)   Overall Financial Resource Strain (CARDIA)    Difficulty of Paying Living Expenses: Not hard at all  Food Insecurity: No Food Insecurity (12/29/2021)   Hunger Vital Sign    Worried About Running Out of Food in the Last Year: Never true    Ran Out of Food in  the Last Year: Never true  Transportation Needs: No Transportation Needs (12/29/2021)   PRAPARE - Hydrologist (Medical): No    Lack of Transportation (Non-Medical): No  Physical Activity: Insufficiently Active (12/29/2021)   Exercise Vital Sign    Days of  Exercise per Week: 1 day    Minutes of Exercise per Session: 30 min  Stress: No Stress Concern Present (12/29/2021)   [place]    Feeling of Stress : Not at all  Social Connections: Moderately Isolated (12/29/2021)   Social Connection and Isolation Panel [NHANES]    Frequency of Communication with Friends and Family: More than three times a week    Frequency of Social Gatherings with Friends and Family: More than three times a week    Attends Religious Services: 1 to 4 times per year    Active Member of [employer] or Organizations: No    Attends Archivist Meetings: Never    Marital Status: Never married  Intimate Partner Violence: Not At Risk (12/29/2021)   Humiliation, Afraid, Rape, and Kick questionnaire    Fear of Current or Ex-Partner: No    Emotionally Abused: No    Physically Abused: No    Sexually Abused: No   Current Outpatient Medications on File Prior to Visit  Medication Sig Dispense Refill   albuterol (PROVENTIL HFA;VENTOLIN HFA) 108 (90 Base) MCG/ACT inhaler Inhale 2 puffs into the lungs every 4 (four) hours as needed for wheezing or shortness of breath. 18 g 2   amLODipine (NORVASC) 5 MG tablet TAKE 1 TABLET BY MOUTH EVERY DAY 90 tablet 1   ammonium lactate (AMLACTIN) 12 % lotion Apply 1 application topically as needed for dry skin. 400 g 5   aspirin EC 81 MG EC tablet Take 1 tablet (81 mg total) by mouth daily. 30 tablet 0   Biotin 5000 MCG TABS Take 10,000 mcg by mouth daily.      calcium-vitamin D (OSCAL WITH D) 500-200 MG-UNIT tablet Take 1 tablet by mouth daily.     Cholecalciferol (VITAMIN D3 PO) Take 2,000 Units  by mouth daily.     clopidogrel (PLAVIX) 75 MG tablet TAKE 1 TABLET BY MOUTH EVERY DAY 90 tablet 1   Continuous Blood Gluc Receiver (FREESTYLE LIBRE 14 DAY READER) DEVI USE AS DIRECTED TO MONITOR SUGARS. DX E11.51 (Patient taking differently: USE AS DIRECTED TO MONITOR SUGARS. DX E11.51 Left upper arm) 1 each 0   Continuous Blood Gluc Receiver (FREESTYLE LIBRE 2 READER) DEVI Use as directed to check blood sugar 1 each 1   Continuous Blood Gluc Sensor (FREESTYLE LIBRE 2 SENSOR) MISC USE AS DIRECTED EVERY 2 WEEKS TO MONITOR BLOOD SUGARS AS INSTRUCTED 1 each 3   dapagliflozin propanediol (FARXIGA) 10 MG TABS tablet Take 1 tablet (10 mg total) by mouth daily before breakfast. 90 tablet 3   glucose blood (FREESTYLE PRECISION NEO TEST) test strip Use as instructed 100 each 12   hydrALAZINE (APRESOLINE) 25 MG tablet TAKE 1 TABLET BY MOUTH THREE TIMES A DAY 270 tablet 1   losartan (COZAAR) 100 MG tablet TAKE 1 TABLET BY MOUTH EVERY DAY 90 tablet 1   metoprolol succinate (TOPROL-XL) 100 MG 24 hr tablet TAKE 1 TABLET BY MOUTH DAILY WITH OR IMMEDIATELY FOLLOWING A MEAL 90 tablet 1   montelukast (SINGULAIR) 10 MG tablet TAKE 1 TABLET BY MOUTH EVERY DAY 90 tablet 1   nystatin-triamcinolone ointment (MYCOLOG) Apply topically as needed. (Patient taking differently: Apply 1 Application topically daily as needed (itching).) 30 g 4   OVER THE COUNTER MEDICATION Take 1 tablet by mouth daily. Multivitamin centrum 1 daily     prednisoLONE acetate (PRED FORTE) 1 % ophthalmic suspension Place 1 drop into the left eye 4 (  four) times daily.     rosuvastatin (CRESTOR) 10 MG tablet TAKE 1 TABLET BY MOUTH EVERY DAY 90 tablet 3   RYBELSUS 14 MG TABS TAKE 1 TABLET (14 MG TOTAL) BY MOUTH DAILY BEFORE BREAKFAST. 90 tablet 1   tacrolimus (PROTOPIC) 0.1 % ointment Apply topically 2 (two) times daily. (Patient taking differently: Apply 1 Application topically daily as needed (blisters).) 30 g 2   No current facility-administered  medications on file prior to visit.   Allergies  Allergen Reactions   Quinapril Cough   Metformin And Related     Loose stool   Family History  Problem Relation Age of Onset   Cancer Maternal Grandmother        Colon Cancer   Asthma Maternal Grandmother    Colon cancer Maternal Grandmother    Diabetes Father    Heart disease Father    Hypertension Father    Hyperlipidemia Father    Stroke Father        passed at age 45 from stroke   Hypertension Mother    Heart disease Mother    Ovarian cancer Mother    Cancer Mother        Lung cancer   Asthma Mother    COPD Mother    Hyperlipidemia Mother    Diabetes Brother    Kidney disease Brother    Hypertension Brother    Berenice Primas' disease Sister    Diabetes Sister    Breast cancer Sister 57   Graves' disease Paternal Grandmother    Hypertension Paternal Grandmother    Heart disease Paternal Grandmother    Alzheimer's disease Paternal Grandmother    Cancer Maternal Grandfather    Heart failure Brother    Prostate cancer Brother    Diabetes Brother    Asthma Brother    Hypertension Brother    Heart failure Brother    Prostate cancer Brother    Stroke Brother    Hypertension Brother    PE: BP 128/74 (BP Location: Right Arm, Patient Position: Sitting, Cuff Size: Normal)   Pulse 66   Ht '5\' 6"'$  (1.676 m)   Wt 172 lb 6.4 oz (78.2 kg)   SpO2 99%   BMI 27.83 kg/m  Wt Readings from Last 3 Encounters:  06/27/22 172 lb 6.4 oz (78.2 kg)  03/12/22 167 lb (75.8 kg)  02/20/22 171 lb 3.2 oz (77.7 kg)   Constitutional: overweight, in NAD Eyes: EOMI, no exophthalmos ENT: no thyromegaly, no cervical lymphadenopathy Cardiovascular: RRR, No MRG Respiratory: CTA B Musculoskeletal: no deformities, strength intact in all 4 Skin: no rash Neurological: no tremor with outstretched hands  ASSESSMENT: 1. DM2, non-insulin-dependent, uncontrolled, with  complications - CVA - 98/3382 - CKD - PDR - PN - severe hypoglycemia in  2018  Component     Latest Ref Rng 10/13/2021  Hemoglobin A1C     4.0 - 5.6 % 8.7 !   Glucose     65 - 99 mg/dL 120 (H)   Glucose Fasting, POC     70 - 99 mg/dL 142 !   Islet Cell Ab     Neg:<1:1  Negative   C-Peptide     0.80 - 3.85 ng/mL 1.92   ZNT8 Antibodies     <15 U/mL <10   Glutamic Acid Decarb Ab     <5 IU/mL <5     No insulin deficiency or antipancreatic autoimmunity.  2. HL  PLAN:  1. Patient with longstanding, uncontrolled, type 2 diabetes, on oral  antidiabetic regimen with SGLT2 inhibitor and GLP-1 receptor agonist, which still poor control.  At last visit, sugars were fluctuating in the upper limit of the target range when she was not eating, but they were increasing significantly after breakfast, lunch, and occasionally after dinner.  She was preparing to start a low-fat plant-based diet so we did not adjust her regimen.  She tells me that she did start this at last OV but diversified to include some meat. -As of now, she is not checking blood sugars consistently, as she had problems with her receiver.  However, she is planning to start checking consistently.  Also, she is preparing to start a cleansing diet and I advised her to stop [place] at the beginning of the diet.  However, upon questioning, she tells me that she is taking glyburide every day.  She tells me that this was not started by Dr. Loanne Drilling, but by PCP.  I do not have records of this.  I advised her to stop the medication right away, especially as she describes that she did have a low blood sugar in the 50s.  - I suggested to:  Patient Instructions  Please continue: - Rybelsus 14 mg before breakfast - Farxiga 10 mg before breakfast  Stop Glyburide if you are still taking it.  Please return in 4 months.  - we checked her HbA1c: 7.4% (lower) - advised to check sugars at different times of the day - 4x a day, rotating check times - advised for yearly eye exams >> she is UTD - return to clinic in 4  months  2. HL - Reviewed latest lipid panel from 07/2021: LDL above our target of less than 55 due to history of cardiovascular disease: Lab Results  Component Value Date   CHOL 174 08/10/2021   HDL 63.90 08/10/2021   LDLCALC 81 08/10/2021   LDLDIRECT 126.7 03/24/2013   TRIG 145.0 08/10/2021   CHOLHDL 3 08/10/2021  - Continues Crestor 10 mg daily without side effects. - Expect the cholesterol to improve further with improved diabetes control and especially on the whole food plant-based diet  Philemon Kingdom, MD PhD [facility] Endocrinology

## 2022-06-27 NOTE — Patient Instructions (Addendum)
Please continue: - Rybelsus 14 mg before breakfast - Farxiga 10 mg before breakfast  Stop Glyburide if you are still taking it.  Please return in 4 months.

## 2022-07-18 ENCOUNTER — Encounter: Payer: Self-pay | Admitting: Internal Medicine

## 2022-07-18 ENCOUNTER — Other Ambulatory Visit: Payer: Self-pay

## 2022-07-18 ENCOUNTER — Ambulatory Visit (INDEPENDENT_AMBULATORY_CARE_PROVIDER_SITE_OTHER): Payer: Medicare Other | Admitting: Internal Medicine

## 2022-07-18 VITALS — BP 142/78 | HR 77 | Temp 98.5°F | Ht 66.0 in | Wt 166.0 lb

## 2022-07-18 DIAGNOSIS — I10 Essential (primary) hypertension: Secondary | ICD-10-CM | POA: Diagnosis not present

## 2022-07-18 DIAGNOSIS — R350 Frequency of micturition: Secondary | ICD-10-CM | POA: Diagnosis not present

## 2022-07-18 DIAGNOSIS — N3000 Acute cystitis without hematuria: Secondary | ICD-10-CM

## 2022-07-18 LAB — POC URINALSYSI DIPSTICK (AUTOMATED)
Bilirubin, UA: NEGATIVE
Blood, UA: NEGATIVE
Glucose, UA: POSITIVE — AB
Ketones, UA: NEGATIVE
Leukocytes, UA: NEGATIVE
Nitrite, UA: POSITIVE
Protein, UA: POSITIVE — AB
Spec Grav, UA: 1.015 (ref 1.010–1.025)
Urobilinogen, UA: 0.2 E.U./dL
pH, UA: 6 (ref 5.0–8.0)

## 2022-07-18 MED ORDER — CEPHALEXIN 500 MG PO CAPS: 500.0000 mg | ORAL_CAPSULE | Freq: Two times a day (BID) | ORAL | 0 refills | Status: DC

## 2022-07-18 NOTE — Assessment & Plan Note (Signed)
Chronic Blood pressure minimally elevated here today She will be returning next month for routine follow-up so I will not make any changes to her medication today-we can recheck at that time Continue amlodipine 5 mg daily, hydralazine 25 mg twice daily, losartan 100 mg daily and metoprolol XL 100 mg daily

## 2022-07-18 NOTE — Assessment & Plan Note (Signed)
Acute Urine dip consistent with UTI Will send urine for culture Take the antibiotic as prescribed.  Keflex 500 mg twice daily x 1 week Take tylenol if needed.   Increase your water intake.  Call if no improvement

## 2022-07-18 NOTE — Progress Notes (Signed)
Subjective:    Patient ID: Rebekah Kennedy, female    DOB: [birth year], 67 y.o.   MRN: [id number]      HPI Rebekah Kennedy is here for  Chief Complaint  Patient presents with   Urinary Frequency    Frequent urination x a few weeks     Frequent urination - symptoms started 2-3 weeks ago - it is getting worse at night.  She denies any dysuria, urinary urgency, blood in the urine, fever, nausea, abdominal pain or back pain.  At first she thought it could have been related to her sugars, but her sugars have improved.  Her last UTI was September 2023.      Medications and allergies reviewed with patient and updated if appropriate.  Current Outpatient Medications on File Prior to Visit  Medication Sig Dispense Refill   albuterol (PROVENTIL HFA;VENTOLIN HFA) 108 (90 Base) MCG/ACT inhaler Inhale 2 puffs into the lungs every 4 (four) hours as needed for wheezing or shortness of breath. 18 g 2   amLODipine (NORVASC) 5 MG tablet TAKE 1 TABLET BY MOUTH EVERY DAY 90 tablet 1   ammonium lactate (AMLACTIN) 12 % lotion Apply 1 application topically as needed for dry skin. 400 g 5   aspirin EC 81 MG EC tablet Take 1 tablet (81 mg total) by mouth daily. 30 tablet 0   Biotin 5000 MCG TABS Take 10,000 mcg by mouth daily.      calcium-vitamin D (OSCAL WITH D) 500-200 MG-UNIT tablet Take 1 tablet by mouth daily.     Cholecalciferol (VITAMIN D3 PO) Take 2,000 Units by mouth daily.     clopidogrel (PLAVIX) 75 MG tablet TAKE 1 TABLET BY MOUTH EVERY DAY 90 tablet 1   Continuous Blood Gluc Receiver (FREESTYLE LIBRE 14 DAY READER) DEVI USE AS DIRECTED TO MONITOR SUGARS. DX E11.51 (Patient taking differently: USE AS DIRECTED TO MONITOR SUGARS. DX E11.51 Left upper arm) 1 each 0   Continuous Blood Gluc Receiver (FREESTYLE LIBRE 2 READER) DEVI Use as directed to check blood sugar 1 each 1   Continuous Blood Gluc Sensor (FREESTYLE LIBRE 2 SENSOR) MISC USE AS DIRECTED EVERY 2 WEEKS TO MONITOR BLOOD SUGARS AS  INSTRUCTED 1 each 3   dapagliflozin propanediol (FARXIGA) 10 MG TABS tablet Take 1 tablet (10 mg total) by mouth daily before breakfast. 90 tablet 3   glucose blood (FREESTYLE PRECISION NEO TEST) test strip Use as instructed 100 each 12   hydrALAZINE (APRESOLINE) 25 MG tablet TAKE 1 TABLET BY MOUTH THREE TIMES A DAY 270 tablet 1   losartan (COZAAR) 100 MG tablet TAKE 1 TABLET BY MOUTH EVERY DAY 90 tablet 1   metoprolol succinate (TOPROL-XL) 100 MG 24 hr tablet TAKE 1 TABLET BY MOUTH DAILY WITH OR IMMEDIATELY FOLLOWING A MEAL 90 tablet 1   montelukast (SINGULAIR) 10 MG tablet TAKE 1 TABLET BY MOUTH EVERY DAY 90 tablet 1   nystatin-triamcinolone ointment (MYCOLOG) Apply topically as needed. (Patient taking differently: Apply 1 Application topically daily as needed (itching).) 30 g 4   OVER THE COUNTER MEDICATION Take 1 tablet by mouth daily. Multivitamin centrum 1 daily     prednisoLONE acetate (PRED FORTE) 1 % ophthalmic suspension Place 1 drop into the left eye 4 (four) times daily.     rosuvastatin (CRESTOR) 10 MG tablet TAKE 1 TABLET BY MOUTH EVERY DAY 90 tablet 3   RYBELSUS 14 MG TABS TAKE 1 TABLET (14 MG TOTAL) BY MOUTH DAILY BEFORE BREAKFAST. 90 tablet 1  tacrolimus (PROTOPIC) 0.1 % ointment Apply topically 2 (two) times daily. (Patient taking differently: Apply 1 Application topically daily as needed (blisters).) 30 g 2   No current facility-administered medications on file prior to visit.    Review of Systems  Constitutional:  Negative for fever.  Gastrointestinal:  Negative for abdominal pain and nausea.  Genitourinary:  Positive for frequency. Negative for dysuria, hematuria and urgency.  Musculoskeletal:  Negative for back pain.       Objective:   Vitals:   07/18/22 1555 07/18/22 1629  BP: (!) 140/70 (!) 142/78  Pulse: 77   Temp: 98.5 F (36.9 C)   SpO2: 98%    BP Readings from Last 3 Encounters:  07/18/22 (!) 142/78  06/27/22 128/74  03/12/22 (!) 157/88   Wt  Readings from Last 3 Encounters:  07/18/22 166 lb (75.3 kg)  06/27/22 172 lb 6.4 oz (78.2 kg)  03/12/22 167 lb (75.8 kg)   Body mass index is 26.79 kg/m.    Physical Exam Constitutional:      General: She is not in acute distress.    Appearance: Normal appearance. She is not ill-appearing.  HENT:     Head: Normocephalic.  Eyes:     Conjunctiva/sclera: Conjunctivae normal.  Cardiovascular:     Rate and Rhythm: Normal rate and regular rhythm.  Pulmonary:     Effort: Pulmonary effort is normal. No respiratory distress.     Breath sounds: No wheezing or rales.  Abdominal:     General: There is no distension.     Palpations: Abdomen is soft.     Tenderness: There is no abdominal tenderness. There is no right CVA tenderness, left CVA tenderness, guarding or rebound.  Skin:    General: Skin is warm and dry.  Neurological:     Mental Status: She is alert.            Assessment & Plan:    See Problem List for Assessment and Plan of chronic medical problems.

## 2022-07-18 NOTE — Patient Instructions (Addendum)
     Medications changes include :   keflex twice daily x 1 week     Return for follow up in March.

## 2022-07-20 LAB — CULTURE, URINE COMPREHENSIVE

## 2022-08-06 ENCOUNTER — Encounter: Payer: Self-pay | Admitting: Internal Medicine

## 2022-08-06 DIAGNOSIS — R350 Frequency of micturition: Secondary | ICD-10-CM

## 2022-08-06 DIAGNOSIS — E1165 Type 2 diabetes mellitus with hyperglycemia: Secondary | ICD-10-CM

## 2022-08-10 ENCOUNTER — Other Ambulatory Visit (INDEPENDENT_AMBULATORY_CARE_PROVIDER_SITE_OTHER): Payer: Medicare Other

## 2022-08-10 DIAGNOSIS — E1165 Type 2 diabetes mellitus with hyperglycemia: Secondary | ICD-10-CM | POA: Diagnosis not present

## 2022-08-10 DIAGNOSIS — E1159 Type 2 diabetes mellitus with other circulatory complications: Secondary | ICD-10-CM

## 2022-08-10 DIAGNOSIS — R350 Frequency of micturition: Secondary | ICD-10-CM

## 2022-08-10 LAB — URINALYSIS, ROUTINE W REFLEX MICROSCOPIC
Bilirubin Urine: NEGATIVE
Ketones, ur: NEGATIVE
Leukocytes,Ua: NEGATIVE
Nitrite: POSITIVE — AB
Specific Gravity, Urine: 1.01 (ref 1.000–1.030)
Total Protein, Urine: NEGATIVE
Urine Glucose: >=1000 — AB
Urobilinogen, UA: 0.2 (ref 0.0–1.0)
pH: 6.5 (ref 5.0–8.0)

## 2022-08-10 LAB — MICROALBUMIN / CREATININE URINE RATIO
Creatinine,U: 21.7 mg/dL
Microalb Creat Ratio: 52.1 mg/g — ABNORMAL HIGH (ref 0.0–30.0)
Microalb, Ur: 11.3 mg/dL — ABNORMAL HIGH (ref 0.0–1.9)

## 2022-08-12 LAB — URINE CULTURE

## 2022-08-12 MED ORDER — AMOXICILLIN-POT CLAVULANATE 875-125 MG PO TABS: 1.0000 | ORAL_TABLET | Freq: Two times a day (BID) | ORAL | 0 refills | Status: AC

## 2022-08-24 ENCOUNTER — Other Ambulatory Visit: Payer: Self-pay | Admitting: Internal Medicine

## 2022-08-24 DIAGNOSIS — R000 Tachycardia, unspecified: Secondary | ICD-10-CM

## 2022-08-24 DIAGNOSIS — I10 Essential (primary) hypertension: Secondary | ICD-10-CM

## 2022-09-06 DIAGNOSIS — E113391 Type 2 diabetes mellitus with moderate nonproliferative diabetic retinopathy without macular edema, right eye: Secondary | ICD-10-CM | POA: Diagnosis not present

## 2022-09-06 DIAGNOSIS — H31092 Other chorioretinal scars, left eye: Secondary | ICD-10-CM | POA: Diagnosis not present

## 2022-09-06 DIAGNOSIS — E113512 Type 2 diabetes mellitus with proliferative diabetic retinopathy with macular edema, left eye: Secondary | ICD-10-CM | POA: Diagnosis not present

## 2022-09-06 DIAGNOSIS — H35033 Hypertensive retinopathy, bilateral: Secondary | ICD-10-CM | POA: Diagnosis not present

## 2022-10-12 ENCOUNTER — Other Ambulatory Visit: Payer: Self-pay | Admitting: Internal Medicine

## 2022-10-30 DIAGNOSIS — Z1231 Encounter for screening mammogram for malignant neoplasm of breast: Secondary | ICD-10-CM | POA: Diagnosis not present

## 2022-10-30 LAB — HM MAMMOGRAPHY

## 2022-10-31 ENCOUNTER — Ambulatory Visit: Payer: BC Managed Care – PPO | Admitting: Obstetrics & Gynecology

## 2022-10-31 ENCOUNTER — Encounter: Payer: Self-pay | Admitting: Internal Medicine

## 2022-11-10 LAB — AMB RESULTS CONSOLE CBG: Glucose: 225

## 2022-11-13 ENCOUNTER — Ambulatory Visit: Payer: Medicare Other | Admitting: Internal Medicine

## 2022-11-13 ENCOUNTER — Encounter: Payer: Self-pay | Admitting: Internal Medicine

## 2022-11-13 VITALS — BP 138/70 | HR 68 | Ht 66.0 in | Wt 175.4 lb

## 2022-11-13 DIAGNOSIS — E7849 Other hyperlipidemia: Secondary | ICD-10-CM

## 2022-11-13 DIAGNOSIS — E119 Type 2 diabetes mellitus without complications: Secondary | ICD-10-CM

## 2022-11-13 DIAGNOSIS — E1165 Type 2 diabetes mellitus with hyperglycemia: Secondary | ICD-10-CM

## 2022-11-13 DIAGNOSIS — E1159 Type 2 diabetes mellitus with other circulatory complications: Secondary | ICD-10-CM | POA: Diagnosis not present

## 2022-11-13 DIAGNOSIS — Z7984 Long term (current) use of oral hypoglycemic drugs: Secondary | ICD-10-CM | POA: Diagnosis not present

## 2022-11-13 LAB — POCT GLYCOSYLATED HEMOGLOBIN (HGB A1C): Hemoglobin A1C: 9.1 % — AB (ref 4.0–5.6)

## 2022-11-13 MED ORDER — SEMAGLUTIDE(0.25 OR 0.5MG/DOS) 2 MG/3ML ~~LOC~~ SOPN: 0.5000 mg | PEN_INJECTOR | SUBCUTANEOUS | 3 refills | Status: DC

## 2022-11-13 NOTE — Progress Notes (Unsigned)
Subjective:    Patient ID: Rebekah Kennedy, female    DOB: 1955/09/24, 67 y.o.   MRN: [id number]     HPI Rebekah Kennedy is here for follow up of her chronic medical problems.  Saw Dr Elvera Lennox yesterday and her DM meds were adjusted.   She had increased leg swelling and some lesions on her legs. This started 1-2 weeks ago.   She has been applying iodine.    She does not always elevate her legs when sitting.  Sometimes falls asleep sitting up -- thinks she only lays down 2-3 times a week when she sleeps.    Medications and allergies reviewed with patient and updated if appropriate.  Current Outpatient Medications on File Prior to Visit  Medication Sig Dispense Refill   albuterol (PROVENTIL HFA;VENTOLIN HFA) 108 (90 Base) MCG/ACT inhaler Inhale 2 puffs into the lungs every 4 (four) hours as needed for wheezing or shortness of breath. 18 g 2   amLODipine (NORVASC) 5 MG tablet TAKE 1 TABLET BY MOUTH EVERY DAY 90 tablet 1   ammonium lactate (AMLACTIN) 12 % lotion Apply 1 application topically as needed for dry skin. 400 g 5   aspirin EC 81 MG EC tablet Take 1 tablet (81 mg total) by mouth daily. 30 tablet 0   Biotin 5000 MCG TABS Take 10,000 mcg by mouth daily.      calcium-vitamin D (OSCAL WITH D) 500-200 MG-UNIT tablet Take 1 tablet by mouth daily.     Cholecalciferol (VITAMIN D3 PO) Take 2,000 Units by mouth daily.     clopidogrel (PLAVIX) 75 MG tablet TAKE 1 TABLET BY MOUTH EVERY DAY 90 tablet 1   Continuous Blood Gluc Receiver (FREESTYLE LIBRE 2 READER) DEVI Use as directed to check blood sugar 1 each 1   Continuous Blood Gluc Sensor (FREESTYLE LIBRE 2 SENSOR) MISC USE AS DIRECTED EVERY 2 WEEKS TO MONITOR BLOOD SUGARS AS INSTRUCTED 1 each 3   dapagliflozin propanediol (FARXIGA) 10 MG TABS tablet Take 1 tablet (10 mg total) by mouth daily before breakfast. 90 tablet 3   glucose blood (FREESTYLE PRECISION NEO TEST) test strip Use as instructed 100 each 12   hydrALAZINE (APRESOLINE) 25 MG  tablet TAKE 1 TABLET BY MOUTH THREE TIMES A DAY 270 tablet 1   losartan (COZAAR) 100 MG tablet TAKE 1 TABLET BY MOUTH EVERY DAY 90 tablet 1   metoprolol succinate (TOPROL-XL) 100 MG 24 hr tablet TAKE 1 TABLET BY MOUTH DAILY WITH OR IMMEDIATELY FOLLOWING A MEAL 90 tablet 1   montelukast (SINGULAIR) 10 MG tablet TAKE 1 TABLET BY MOUTH EVERY DAY 90 tablet 1   nystatin-triamcinolone ointment (MYCOLOG) Apply topically as needed. (Patient taking differently: Apply 1 Application topically daily as needed (itching).) 30 g 4   OVER THE COUNTER MEDICATION Take 1 tablet by mouth daily. Multivitamin centrum 1 daily     prednisoLONE acetate (PRED FORTE) 1 % ophthalmic suspension Place 1 drop into the left eye 4 (four) times daily.     rosuvastatin (CRESTOR) 10 MG tablet TAKE 1 TABLET BY MOUTH EVERY DAY 90 tablet 3   Semaglutide,0.25 or 0.5MG /DOS, 2 MG/3ML SOPN Inject 0.5 mg into the skin once a week. 9 mL 3   tacrolimus (PROTOPIC) 0.1 % ointment Apply topically 2 (two) times daily. (Patient taking differently: Apply 1 Application topically daily as needed (blisters).) 30 g 2   No current facility-administered medications on file prior to visit.     Review of Systems  Constitutional:  Negative for fever.  Respiratory:  Negative for cough, shortness of breath and wheezing.   Cardiovascular:  Positive for leg swelling. Negative for chest pain and palpitations.  Neurological:  Positive for light-headedness (occ). Negative for headaches.       Objective:   Vitals:   11/14/22 1049  BP: 128/60  Pulse: 71  Temp: 98 F (36.7 C)  SpO2: 99%   BP Readings from Last 3 Encounters:  11/14/22 128/60  11/13/22 138/70  11/10/22 (!) 158/78   Wt Readings from Last 3 Encounters:  11/14/22 173 lb (78.5 kg)  11/13/22 175 lb 6.4 oz (79.6 kg)  07/18/22 166 lb (75.3 kg)   Body mass index is 27.92 kg/m.    Physical Exam Constitutional:      General: She is not in acute distress.    Appearance: Normal  appearance.  HENT:     Head: Normocephalic and atraumatic.  Eyes:     Conjunctiva/sclera: Conjunctivae normal.  Cardiovascular:     Rate and Rhythm: Normal rate and regular rhythm.  Pulmonary:     Effort: Pulmonary effort is normal. No respiratory distress.     Breath sounds: Normal breath sounds. No wheezing.  Musculoskeletal:     Cervical back: Neck supple.     Right lower leg: Edema (1+ pitting edema with mild erythema lower leg and several sores anterior and posterior leg a couple that have clear fluid discharge, some that starting to scab over-right leg more swollen than left) present.     Left lower leg: Edema (1+ mildly pitting edema.  Couple of sores where fluid has started to drain from the leg-clear fluid, no generalized erythema) present.  Lymphadenopathy:     Cervical: No cervical adenopathy.  Skin:    General: Skin is warm and dry.     Findings: No rash.  Neurological:     Mental Status: She is alert. Mental status is at baseline.  Psychiatric:        Mood and Affect: Mood normal.        Behavior: Behavior normal.        Lab Results  Component Value Date   WBC 7.4 08/10/2021   HGB 13.6 08/10/2021   HCT 42.2 08/10/2021   PLT 254.0 08/10/2021   GLUCOSE 314 (H) 02/03/2022   CHOL 174 08/10/2021   TRIG 145.0 08/10/2021   HDL 63.90 08/10/2021   LDLDIRECT 126.7 03/24/2013   LDLCALC 81 08/10/2021   ALT 21 08/10/2021   AST 16 08/10/2021   NA 139 02/03/2022   K 4.0 02/03/2022   CL 107 02/03/2022   CREATININE 0.89 02/03/2022   BUN 24 (H) 02/03/2022   CO2 25 02/03/2022   TSH 1.28 08/10/2021   INR 0.96 07/07/2018   HGBA1C 7.4 (A) 06/27/2022   MICROALBUR 11.3 (H) 08/10/2022     Assessment & Plan:    See Problem List for Assessment and Plan of chronic medical problems.

## 2022-11-13 NOTE — Patient Instructions (Addendum)
Please continue: - Farxiga 10 mg before breakfast  Stop Rybelsus and start: - Ozempic 0.5 mg weekly  Please let me know about the sugars if they do not improve.   Please return in 3-4 months.

## 2022-11-13 NOTE — Patient Instructions (Addendum)
      Blood work was ordered.   The lab is on the first floor.    Medications changes include :   mupirocin ointment for the legs, augmentin for infection and lasix 20 mg daily for the fluid      Return in about 1 week (around 11/21/2022) for follow up.

## 2022-11-13 NOTE — Progress Notes (Signed)
Patient ID: Rebekah Kennedy, female   DOB: 1955-08-24, 67 y.o.   MRN: [id number]  HPI: Rebekah Kennedy is a 67 y.o.-year-old female, returning for follow-up for DM2, dx in 2004, non-insulin-dependent, uncontrolled, with complications (CVA, PDR, CKD, PN, severe hypoglycemia in 2018). Pt. previously saw Dr. Everardo All. Last OV with me 4 months ago.  Interim history: She was prev. a whole food plant based diet  - based on Dr. Guy Sandifer book.  She relaxed her diet in the last few mo. sugars in the significantly. No nausea, chest pain.  She as increased urination. She has 2 ulcers on her shins.  Reviewed HbA1c: Lab Results  Component Value Date   HGBA1C 7.4 (A) 06/27/2022   HGBA1C 7.6 (A) 02/20/2022   HGBA1C 8.7 (A) 10/13/2021   HGBA1C 8.8 (H) 08/10/2021   HGBA1C 7.4 (H) 02/10/2021   HGBA1C 8.2 (H) 11/09/2020   HGBA1C 8.6 (H) 08/05/2020   HGBA1C 8.0 (H) 04/27/2020   HGBA1C 6.8 (H) 07/24/2019   HGBA1C 7.1 (H) 04/24/2019   Pt is on a regimen of: - Januvia >> Rybelsus 14 mg before breakfast - Farxiga 10 >> 5 >> 10 mg before breakfast Glyburide every day - started by Dr. Lawerance Bach >> stopped 06/2022 due to CBG 50. Metformin caused diarrhea. She was previously on insulin between 2013 and 2019 before her gastric bypass.  She checks her blood sugars with the freestyle libre CGM:  Prev.:   Lowest sugar was 100 >> 60s >> 56 >> 56; she has hypoglycemia awareness at 70.  Highest sugar was 300s >> 400 >> 344 >> 300s.  Glucometer:One Touch Verio  - no CKD, last BUN/creatinine:  Lab Results  Component Value Date   BUN 24 (H) 02/03/2022   BUN 16 08/10/2021   CREATININE 0.89 02/03/2022   CREATININE 0.71 08/10/2021  On losartan 100 mg daily.  -+ HL; last set of lipids: Lab Results  Component Value Date   CHOL 174 08/10/2021   HDL 63.90 08/10/2021   LDLCALC 81 08/10/2021   LDLDIRECT 126.7 03/24/2013   TRIG 145.0 08/10/2021   CHOLHDL 3 08/10/2021  On Crestor 10 mg daily.  - last eye  exam was in 2024. + DR reportedly - improved. Finished IO injections for macular edema.  - no numbness and tingling in her feet.  Last foot exam July 21, 2021.  On ASA 81.  She had gastric bypass in 2019.  She lost 80 pounds afterwards. She also has a history of HTN, osteopenia, depression, asthma, obstructive sleep apnea. She is a Runner, broadcasting/film/video.  ROS: + see HPI  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 issuse resolved   Retinal edema    gets steriod  inj in the eyes     Sleep apnea    not needed since 100 pound weight loss 2019-2020   Splenomegaly    in college   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   left side pt states, left wide weak per pt   Stroke [facility]) 2012   right pontine cva with left hemiparesis   Past Surgical History:  Procedure Laterality Date   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   APPENDECTOMY     yrs ago per pt 0n 11-29-2020   BIOPSY THYROID  05/02/2011   Nonneoplastic goiter   BREAST BIOPSY     BREAST LUMPECTOMY WITH RADIOACTIVE SEED LOCALIZATION Left 07/25/2017   Procedure: LEFT BREAST LUMPECTOMY WITH RADIOACTIVE SEED LOCALIZATION;  Surgeon: Abigail Miyamoto, MD;  Location: [facility] OR;  Service: General;  Laterality: Left;   BREAST SURGERY     Reduction   cataracts Bilateral 2012   COLONOSCOPY  2021   COLPOSCOPY     DILATATION &  CURETTAGE/HYSTEROSCOPY WITH MYOSURE N/A 12/02/2020   Procedure: DILATATION & CURETTAGE/HYSTEROSCOPY WITH MYOSURE;  Surgeon: Genia Del, MD;  Location: [facility];  Service: Gynecology;  Laterality: N/A;   DILATION AND CURETTAGE OF UTERUS  1975   DUB   EYE SURGERY     Laser for vision at times both eyes done   GASTRIC ROUX-EN-Y N/A 08/12/2017   Procedure: LAPAROSCOPIC ROUX-EN-Y GASTRIC BYPASS WITH HIATAL HERNIA REPAIR AND UPPER ENDOSCOPY;  Surgeon: [name], De Blanch, MD;  Location: [facility] ORS;  Service: General;  Laterality: N/A;   GYNECOLOGIC CRYOSURGERY     MYOMECTOMY     OVARIAN CYST REMOVAL     PELVIC LAPAROSCOPY     DL lysis of adhesions 1610 and 1988   Social History   Socioeconomic History   Marital status: Single    Spouse name: Not on file   Number of children: 2   Years of education: 16   Highest education level: Bachelor's degree (e.g., BA, AB, BS)  Occupational History   Occupation: TEACHER    Employer: [employer]  Tobacco Use   Smoking status: Never   Smokeless tobacco: Never  Vaping Use   Vaping Use: Never used  Substance and Sexual Activity   Alcohol use: Not Currently    Alcohol/week: 0.0 standard drinks of alcohol   Drug use: Never   Sexual activity: Not Currently    Birth control/protection: Post-menopausal    Comment: older than 17, less than 5  Other Topics Concern   Not on file  Social History Narrative   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   Living with daughter      Right handed      4 steps inside of the home      Social Determinants of Health   Financial Resource Strain: Low Risk  (12/29/2021)   Overall Financial Resource Strain (CARDIA)    Difficulty of Paying Living Expenses: Not hard at all  Food Insecurity: No Food Insecurity (12/29/2021)   Hunger Vital Sign    Worried About Running Out of Food in the Last Year: Never true    Ran Out of Food in  the Last Year: Never true  Transportation Needs: No Transportation Needs (12/29/2021)   PRAPARE - Administrator, Civil Service (Medical): No    Lack of Transportation (Non-Medical): No  Physical Activity: Insufficiently Active (12/29/2021)   Exercise Vital Sign    Days  of Exercise per Week: 1 day    Minutes of Exercise per Session: 30 min  Stress: No Stress Concern Present (12/29/2021)   [employer] of Occupational Health - Occupational Stress Questionnaire    Feeling of Stress : Not at all  Social Connections: Moderately Isolated (12/29/2021)   Social Connection and Isolation Panel [NHANES]    Frequency of Communication with Friends and Family: More than three times a week    Frequency of Social Gatherings with Friends and Family: More than three times a week    Attends Religious Services: 1 to 4 times per year    Active Member of [employer] or Organizations: No    Attends Banker Meetings: Never    Marital Status: Never married  Intimate Partner Violence: Not At Risk (12/29/2021)   Humiliation, Afraid, Rape, and Kick questionnaire    Fear of Current or Ex-Partner: No    Emotionally Abused: No    Physically Abused: No    Sexually Abused: No   Current Outpatient Medications on File Prior to Visit  Medication Sig Dispense Refill   albuterol (PROVENTIL HFA;VENTOLIN HFA) 108 (90 Base) MCG/ACT inhaler Inhale 2 puffs into the lungs every 4 (four) hours as needed for wheezing or shortness of breath. 18 g 2   amLODipine (NORVASC) 5 MG tablet TAKE 1 TABLET BY MOUTH EVERY DAY 90 tablet 1   ammonium lactate (AMLACTIN) 12 % lotion Apply 1 application topically as needed for dry skin. 400 g 5   aspirin EC 81 MG EC tablet Take 1 tablet (81 mg total) by mouth daily. 30 tablet 0   Biotin 5000 MCG TABS Take 10,000 mcg by mouth daily.      calcium-vitamin D (OSCAL WITH D) 500-200 MG-UNIT tablet Take 1 tablet by mouth daily.     Cholecalciferol (VITAMIN D3 PO) Take 2,000 Units  by mouth daily.     clopidogrel (PLAVIX) 75 MG tablet TAKE 1 TABLET BY MOUTH EVERY DAY 90 tablet 1   Continuous Blood Gluc Receiver (FREESTYLE LIBRE 14 DAY READER) DEVI USE AS DIRECTED TO MONITOR SUGARS. DX E11.51 (Patient taking differently: USE AS DIRECTED TO MONITOR SUGARS. DX E11.51 Left upper arm) 1 each 0   Continuous Blood Gluc Receiver (FREESTYLE LIBRE 2 READER) DEVI Use as directed to check blood sugar 1 each 1   Continuous Blood Gluc Sensor (FREESTYLE LIBRE 2 SENSOR) MISC USE AS DIRECTED EVERY 2 WEEKS TO MONITOR BLOOD SUGARS AS INSTRUCTED 1 each 3   dapagliflozin propanediol (FARXIGA) 10 MG TABS tablet Take 1 tablet (10 mg total) by mouth daily before breakfast. 90 tablet 3   glucose blood (FREESTYLE PRECISION NEO TEST) test strip Use as instructed 100 each 12   hydrALAZINE (APRESOLINE) 25 MG tablet TAKE 1 TABLET BY MOUTH THREE TIMES A DAY 270 tablet 1   losartan (COZAAR) 100 MG tablet TAKE 1 TABLET BY MOUTH EVERY DAY 90 tablet 1   metoprolol succinate (TOPROL-XL) 100 MG 24 hr tablet TAKE 1 TABLET BY MOUTH DAILY WITH OR IMMEDIATELY FOLLOWING A MEAL 90 tablet 1   montelukast (SINGULAIR) 10 MG tablet TAKE 1 TABLET BY MOUTH EVERY DAY 90 tablet 1   nystatin-triamcinolone ointment (MYCOLOG) Apply topically as needed. (Patient taking differently: Apply 1 Application topically daily as needed (itching).) 30 g 4   OVER THE COUNTER MEDICATION Take 1 tablet by mouth daily. Multivitamin centrum 1 daily     prednisoLONE acetate (PRED FORTE) 1 % ophthalmic suspension Place 1 drop into the left eye  4 (four) times daily.     rosuvastatin (CRESTOR) 10 MG tablet TAKE 1 TABLET BY MOUTH EVERY DAY 90 tablet 3   RYBELSUS 14 MG TABS TAKE 1 TABLET (14 MG TOTAL) BY MOUTH DAILY BEFORE BREAKFAST. 90 tablet 1   tacrolimus (PROTOPIC) 0.1 % ointment Apply topically 2 (two) times daily. (Patient taking differently: Apply 1 Application topically daily as needed (blisters).) 30 g 2   No current facility-administered  medications on file prior to visit.   Allergies  Allergen Reactions   Quinapril Cough   Metformin And Related     Loose stool   Family History  Problem Relation Age of Onset   Cancer Maternal Grandmother        Colon Cancer   Asthma Maternal Grandmother    Colon cancer Maternal Grandmother    Diabetes Father    Heart disease Father    Hypertension Father    Hyperlipidemia Father    Stroke Father        passed at age 27 from stroke   Hypertension Mother    Heart disease Mother    Ovarian cancer Mother    Cancer Mother        Lung cancer   Asthma Mother    COPD Mother    Hyperlipidemia Mother    Diabetes Brother    Kidney disease Brother    Hypertension Brother    Luiz Blare' disease Sister    Diabetes Sister    Breast cancer Sister 53   Graves' disease Paternal Grandmother    Hypertension Paternal Grandmother    Heart disease Paternal Grandmother    Alzheimer's disease Paternal Grandmother    Cancer Maternal Grandfather    Heart failure Brother    Prostate cancer Brother    Diabetes Brother    Asthma Brother    Hypertension Brother    Heart failure Brother    Prostate cancer Brother    Stroke Brother    Hypertension Brother    PE: BP 138/70   Pulse 68   Ht 5\' 6"  (1.676 m)   Wt 175 lb 6.4 oz (79.6 kg)   SpO2 97%   BMI 28.31 kg/m  Wt Readings from Last 3 Encounters:  11/13/22 175 lb 6.4 oz (79.6 kg)  07/18/22 166 lb (75.3 kg)  06/27/22 172 lb 6.4 oz (78.2 kg)   Constitutional: overweight, in NAD Eyes: EOMI, no exophthalmos ENT: no thyromegaly, no cervical lymphadenopathy Cardiovascular: RRR, No MRG Respiratory: CTA B Musculoskeletal: no deformities Skin: + B staisis dermatitis rash. 0.5 cm superficial ulcer R shin, violaceous small healed ulcer on L shin Neurological: no tremor with outstretched hands Diabetic Foot Exam - Simple   Simple Foot Form Diabetic Foot exam was performed with the following findings: Yes 11/13/2022 10:09 AM  Visual  Inspection No deformities, no ulcerations, no other skin breakdown bilaterally: Yes Sensation Testing Intact to touch and monofilament testing bilaterally: Yes Pulse Check See comments: Yes Comments Decreased pedal pulses B B pitting edema B onychodystrophy      ASSESSMENT: 1. DM2, non-insulin-dependent, uncontrolled, with  complications - CVA - 06/2018 - CKD - PDR - PN - severe hypoglycemia in 2018  Component     Latest Ref Rng 10/13/2021  Hemoglobin A1C     4.0 - 5.6 % 8.7 !   Glucose     65 - 99 mg/dL 782 (H)   Glucose Fasting, POC     70 - 99 mg/dL 956 !   Islet Cell Ab  Neg:<1:1  Negative   C-Peptide     0.80 - 3.85 ng/mL 1.92   ZNT8 Antibodies     <15 U/mL <10   Glutamic Acid Decarb Ab     <5 IU/mL <5     No insulin deficiency or antipancreatic autoimmunity.  2. HL  PLAN:  1. Patient with longstanding, uncontrolled, type 2 diabetes, on oral antidiabetic regimen with GLP-1 receptor agonist and SGLT2 inhibitor, with improving control.  At last visit, HbA1c decreased to 7.4%.  At that time, apparently she was still on glyburide, started by PCP.  She had a low blood sugar in the 50s.  I advised her to stop the glyburide.  She was not checking sugars consistently but was planning to start doing so.  We did not change the Rybelsus and Farxiga doses.  She was following a mostly plant-based diet. CGM interpretation: -At today's visit, we reviewed her CGM downloads: It appears that 45% of values are in target range (goal >70%), while 54% are higher than 180 (goal <25%), and 0% are lower than 70 (goal <4%).  The calculated average blood sugar is 202.  The projected HbA1c for the next 3 months (GMI) is 8.1%. -Reviewing the CGM trends, sugars are much higher than before, improving overnight but then increasing in a stepwise fashion throughout the day, with the greatest increase after dinner.  For now, we discussed about switching from Rybelsus to Ozempic and I advised her  to get in touch with me if sugars do not improve afterwards, in which case, we will need to add insulin.  Will continue [place] for now. -Regarding her shin ulcers, she is applying iodine, which I advised her to stop.  I advised her to wash them with soapy water and then put Neosporin and cover them.  However, if they do not get better, I advised her to schedule an appointment with PCP.  She may need vascular studies or referral to wound clinic. - I suggested to:  Patient Instructions  Please continue: - Farxiga 10 mg before breakfast  Stop Rybelsus and start: - Ozempic 0.5 mg weekly  Please let me know about the sugars if they do not improve.   Please return in 3-4 months.  - we checked her HbA1c: 9.1% (higher) - advised to check sugars at different times of the day - 4x a day, rotating check times - advised for yearly eye exams >> she is UTD - return to clinic in 4 months  2. HL -Reviewed latest lipid panel from 2023: LDL above target of less than 55, the rest the fractions at goal: Lab Results  Component Value Date   CHOL 174 08/10/2021   HDL 63.90 08/10/2021   LDLCALC 81 08/10/2021   LDLDIRECT 126.7 03/24/2013   TRIG 145.0 08/10/2021   CHOLHDL 3 08/10/2021  - Continues Crestor 10 mg daily without side effects -Needs to improve her diet, which will help with both diabetes and cholesterol levels  Carlus Pavlov, MD PhD [facility] Endocrinology

## 2022-11-14 ENCOUNTER — Encounter: Payer: Self-pay | Admitting: Internal Medicine

## 2022-11-14 ENCOUNTER — Other Ambulatory Visit: Payer: Self-pay | Admitting: Internal Medicine

## 2022-11-14 ENCOUNTER — Ambulatory Visit (INDEPENDENT_AMBULATORY_CARE_PROVIDER_SITE_OTHER): Payer: Medicare Other | Admitting: Internal Medicine

## 2022-11-14 VITALS — BP 128/60 | HR 71 | Temp 98.0°F | Ht 66.0 in | Wt 173.0 lb

## 2022-11-14 DIAGNOSIS — E1165 Type 2 diabetes mellitus with hyperglycemia: Secondary | ICD-10-CM

## 2022-11-14 DIAGNOSIS — I10 Essential (primary) hypertension: Secondary | ICD-10-CM

## 2022-11-14 DIAGNOSIS — L03115 Cellulitis of right lower limb: Secondary | ICD-10-CM | POA: Insufficient documentation

## 2022-11-14 DIAGNOSIS — R600 Localized edema: Secondary | ICD-10-CM

## 2022-11-14 DIAGNOSIS — E7849 Other hyperlipidemia: Secondary | ICD-10-CM | POA: Diagnosis not present

## 2022-11-14 DIAGNOSIS — Z7985 Long-term (current) use of injectable non-insulin antidiabetic drugs: Secondary | ICD-10-CM

## 2022-11-14 DIAGNOSIS — I700 Atherosclerosis of aorta: Secondary | ICD-10-CM

## 2022-11-14 DIAGNOSIS — E1159 Type 2 diabetes mellitus with other circulatory complications: Secondary | ICD-10-CM | POA: Diagnosis not present

## 2022-11-14 DIAGNOSIS — J4520 Mild intermittent asthma, uncomplicated: Secondary | ICD-10-CM

## 2022-11-14 LAB — LIPID PANEL
Cholesterol: 168 mg/dL (ref 0–200)
HDL: 76.9 mg/dL (ref >39.00)
LDL Cholesterol: 73 mg/dL (ref 0–99)
NonHDL: 91.2
Total CHOL/HDL Ratio: 2
Triglycerides: 91 mg/dL (ref 0.0–149.0)
VLDL: 18.2 mg/dL (ref 0.0–40.0)

## 2022-11-14 LAB — COMPREHENSIVE METABOLIC PANEL
ALT: 16 U/L (ref 0–35)
AST: 15 U/L (ref 0–37)
Albumin: 3.9 g/dL (ref 3.5–5.2)
Alkaline Phosphatase: 90 U/L (ref 39–117)
BUN: 17 mg/dL (ref 6–23)
CO2: 31 mEq/L (ref 19–32)
Calcium: 9.8 mg/dL (ref 8.4–10.5)
Chloride: 103 mEq/L (ref 96–112)
Creatinine, Ser: 0.76 mg/dL (ref 0.40–1.20)
GFR: 81.75 mL/min (ref >60.00)
Glucose, Bld: 188 mg/dL — ABNORMAL HIGH (ref 70–99)
Potassium: 4.7 mEq/L (ref 3.5–5.1)
Sodium: 139 mEq/L (ref 135–145)
Total Bilirubin: 0.7 mg/dL (ref 0.2–1.2)
Total Protein: 7.2 g/dL (ref 6.0–8.3)

## 2022-11-14 LAB — CBC WITH DIFFERENTIAL/PLATELET
Basophils Absolute: 0.1 10*3/uL (ref 0.0–0.1)
Basophils Relative: 1.2 % (ref 0.0–3.0)
Eosinophils Absolute: 0.3 10*3/uL (ref 0.0–0.7)
Eosinophils Relative: 4.3 % (ref 0.0–5.0)
HCT: 41.5 % (ref 36.0–46.0)
Hemoglobin: 13.3 g/dL (ref 12.0–15.0)
Lymphocytes Relative: 29.6 % (ref 12.0–46.0)
Lymphs Abs: 2.3 10*3/uL (ref 0.7–4.0)
MCHC: 32.1 g/dL (ref 30.0–36.0)
MCV: 84.8 fl (ref 78.0–100.0)
Monocytes Absolute: 0.5 10*3/uL (ref 0.1–1.0)
Monocytes Relative: 6.1 % (ref 3.0–12.0)
Neutro Abs: 4.5 10*3/uL (ref 1.4–7.7)
Neutrophils Relative %: 58.8 % (ref 43.0–77.0)
Platelets: 298 10*3/uL (ref 150.0–400.0)
RBC: 4.89 Mil/uL (ref 3.87–5.11)
RDW: 14 % (ref 11.5–15.5)
WBC: 7.6 10*3/uL (ref 4.0–10.5)

## 2022-11-14 MED ORDER — FUROSEMIDE 20 MG PO TABS: 20.0000 mg | ORAL_TABLET | Freq: Every day | ORAL | 3 refills | Status: DC

## 2022-11-14 MED ORDER — AMOXICILLIN-POT CLAVULANATE 875-125 MG PO TABS: 1.0000 | ORAL_TABLET | Freq: Two times a day (BID) | ORAL | 0 refills | Status: DC

## 2022-11-14 MED ORDER — MUPIROCIN 2 % EX OINT: 1.0000 | TOPICAL_OINTMENT | Freq: Two times a day (BID) | CUTANEOUS | 0 refills | Status: DC

## 2022-11-14 NOTE — Assessment & Plan Note (Signed)
Chronic Management per Dr  Elvera Lennox - meds just revised Stressed compliance with  diabetic diet Stressed regular exercise Stressed weight loss

## 2022-11-14 NOTE — Assessment & Plan Note (Signed)
Acute Likely related to increased leg edema and sores related to fluid trying to dry out of the skin Will be starting Lasix 20 mg daily and she will start elevating her legs when sitting and will consistently sleep laying down Start Augmentin 875-125 mg twice daily x 7 days Bactroban ointment to lesions Follow-up in 1 week

## 2022-11-14 NOTE — Assessment & Plan Note (Signed)
Chronic ?Mild, intermittent ?Controlled ?Continue Singulair 10 mg nightly and albuterol inhaler as needed ( uses usually on damp days) ?

## 2022-11-14 NOTE — Assessment & Plan Note (Addendum)
Chronic Blood pressure controlled cmp Continue amlodipine 5 mg daily, hydralazine 25 mg twice daily, losartan 100 mg daily and metoprolol XL 100 mg daily

## 2022-11-14 NOTE — Assessment & Plan Note (Signed)
Chronic Regular exercise and healthy diet encouraged Check lipid panel  Continue Crestor 10 mg daily 

## 2022-11-14 NOTE — Assessment & Plan Note (Signed)
Chronic Lab Results  Component Value Date   LDLCALC 81 08/10/2021   Check lipids today-goal less than 70-May need to adjust medication Continue rosuvastatin 10 mg daily

## 2022-11-14 NOTE — Assessment & Plan Note (Signed)
Acute on chronic Swelling has been worse the past 1-2 weeks and she is having some clear fluid draining out of the legs in different areas which is creating sores Mild erythema right lower extremity concerning for possible cellulitis Start Lasix 20 mg daily Stressed that she needs to elevate her legs when sitting and she needs to be sleeping horizontally-cannot sleep sitting up

## 2022-11-20 NOTE — Patient Instructions (Signed)
      Blood work was ordered.   The lab is on the first floor.    Medications changes include :       A referral was ordered and someone will call you to schedule an appointment.     No follow-ups on file.  

## 2022-11-20 NOTE — Progress Notes (Unsigned)
Subjective:    Patient ID: Rebekah Kennedy, female    DOB: 01-16-1956, 67 y.o.   MRN: [id number]     HPI Rebekah Kennedy is here for follow up of her chronic medical problems.  Was here last week for leg swelling x 1-2 weeks with sores on legs from where there was leaking.  She had cellulitis RLE.  Started augmentin, lasix 20 mg daily.   Medications and allergies reviewed with patient and updated if appropriate.  Current Outpatient Medications on File Prior to Visit  Medication Sig Dispense Refill   albuterol (PROVENTIL HFA;VENTOLIN HFA) 108 (90 Base) MCG/ACT inhaler Inhale 2 puffs into the lungs every 4 (four) hours as needed for wheezing or shortness of breath. 18 g 2   amLODipine (NORVASC) 5 MG tablet TAKE 1 TABLET BY MOUTH EVERY DAY 90 tablet 1   ammonium lactate (AMLACTIN) 12 % lotion Apply 1 application topically as needed for dry skin. 400 g 5   amoxicillin-clavulanate (AUGMENTIN) 875-125 MG tablet Take 1 tablet by mouth 2 (two) times daily for 7 days. 14 tablet 0   aspirin EC 81 MG EC tablet Take 1 tablet (81 mg total) by mouth daily. 30 tablet 0   Biotin 5000 MCG TABS Take 10,000 mcg by mouth daily.      calcium-vitamin D (OSCAL WITH D) 500-200 MG-UNIT tablet Take 1 tablet by mouth daily.     Cholecalciferol (VITAMIN D3 PO) Take 2,000 Units by mouth daily.     clopidogrel (PLAVIX) 75 MG tablet TAKE 1 TABLET BY MOUTH EVERY DAY 90 tablet 1   Continuous Blood Gluc Receiver (FREESTYLE LIBRE 2 READER) DEVI Use as directed to check blood sugar 1 each 1   Continuous Blood Gluc Sensor (FREESTYLE LIBRE 2 SENSOR) MISC USE AS DIRECTED EVERY 2 WEEKS TO MONITOR BLOOD SUGARS AS INSTRUCTED 1 each 3   dapagliflozin propanediol (FARXIGA) 10 MG TABS tablet Take 1 tablet (10 mg total) by mouth daily before breakfast. 90 tablet 3   furosemide (LASIX) 20 MG tablet TAKE 1 TABLET BY MOUTH EVERY DAY 90 tablet 1   glucose blood (FREESTYLE PRECISION NEO TEST) test strip Use as instructed 100 each 12    hydrALAZINE (APRESOLINE) 25 MG tablet TAKE 1 TABLET BY MOUTH THREE TIMES A DAY 270 tablet 1   losartan (COZAAR) 100 MG tablet TAKE 1 TABLET BY MOUTH EVERY DAY 90 tablet 1   metoprolol succinate (TOPROL-XL) 100 MG 24 hr tablet TAKE 1 TABLET BY MOUTH DAILY WITH OR IMMEDIATELY FOLLOWING A MEAL 90 tablet 1   montelukast (SINGULAIR) 10 MG tablet TAKE 1 TABLET BY MOUTH EVERY DAY 90 tablet 1   mupirocin ointment (BACTROBAN) 2 % Apply 1 Application topically 2 (two) times daily. 22 g 0   nystatin-triamcinolone ointment (MYCOLOG) Apply topically as needed. (Patient taking differently: Apply 1 Application topically daily as needed (itching).) 30 g 4   OVER THE COUNTER MEDICATION Take 1 tablet by mouth daily. Multivitamin centrum 1 daily     prednisoLONE acetate (PRED FORTE) 1 % ophthalmic suspension Place 1 drop into the left eye 4 (four) times daily.     rosuvastatin (CRESTOR) 10 MG tablet TAKE 1 TABLET BY MOUTH EVERY DAY 90 tablet 3   Semaglutide,0.25 or 0.5MG /DOS, 2 MG/3ML SOPN Inject 0.5 mg into the skin once a week. 9 mL 3   tacrolimus (PROTOPIC) 0.1 % ointment Apply topically 2 (two) times daily. (Patient taking differently: Apply 1 Application topically daily as needed (blisters).) 30  g 2   No current facility-administered medications on file prior to visit.     Review of Systems     Objective:  There were no vitals filed for this visit. BP Readings from Last 3 Encounters:  11/14/22 128/60  11/13/22 138/70  11/10/22 (!) 158/78   Wt Readings from Last 3 Encounters:  11/14/22 173 lb (78.5 kg)  11/13/22 175 lb 6.4 oz (79.6 kg)  07/18/22 166 lb (75.3 kg)   There is no height or weight on file to calculate BMI.    Physical Exam     Lab Results  Component Value Date   WBC 7.6 11/14/2022   HGB 13.3 11/14/2022   HCT 41.5 11/14/2022   PLT 298.0 11/14/2022   GLUCOSE 188 (H) 11/14/2022   CHOL 168 11/14/2022   TRIG 91.0 11/14/2022   HDL 76.90 11/14/2022   LDLDIRECT 126.7 03/24/2013    LDLCALC 73 11/14/2022   ALT 16 11/14/2022   AST 15 11/14/2022   NA 139 11/14/2022   K 4.7 11/14/2022   CL 103 11/14/2022   CREATININE 0.76 11/14/2022   BUN 17 11/14/2022   CO2 31 11/14/2022   TSH 1.28 08/10/2021   INR 0.96 07/07/2018   HGBA1C 7.4 (A) 06/27/2022   MICROALBUR 11.3 (H) 08/10/2022     Assessment & Plan:    See Problem List for Assessment and Plan of chronic medical problems.

## 2022-11-21 ENCOUNTER — Encounter: Payer: Self-pay | Admitting: Internal Medicine

## 2022-11-21 ENCOUNTER — Ambulatory Visit (INDEPENDENT_AMBULATORY_CARE_PROVIDER_SITE_OTHER): Payer: Medicare Other | Admitting: Internal Medicine

## 2022-11-21 VITALS — BP 130/76 | HR 78 | Temp 98.5°F | Ht 66.0 in | Wt 167.8 lb

## 2022-11-21 DIAGNOSIS — W57XXXA Bitten or stung by nonvenomous insect and other nonvenomous arthropods, initial encounter: Secondary | ICD-10-CM | POA: Diagnosis not present

## 2022-11-21 DIAGNOSIS — R600 Localized edema: Secondary | ICD-10-CM

## 2022-11-21 DIAGNOSIS — I10 Essential (primary) hypertension: Secondary | ICD-10-CM | POA: Diagnosis not present

## 2022-11-21 DIAGNOSIS — S00461A Insect bite (nonvenomous) of right ear, initial encounter: Secondary | ICD-10-CM

## 2022-11-21 DIAGNOSIS — E1165 Type 2 diabetes mellitus with hyperglycemia: Secondary | ICD-10-CM

## 2022-11-21 DIAGNOSIS — Q845 Enlarged and hypertrophic nails: Secondary | ICD-10-CM

## 2022-11-21 LAB — BASIC METABOLIC PANEL
BUN: 26 mg/dL — ABNORMAL HIGH (ref 6–23)
CO2: 33 mEq/L — ABNORMAL HIGH (ref 19–32)
Calcium: 10.4 mg/dL (ref 8.4–10.5)
Chloride: 103 mEq/L (ref 96–112)
Creatinine, Ser: 0.85 mg/dL (ref 0.40–1.20)
GFR: 71.47 mL/min (ref >60.00)
Glucose, Bld: 190 mg/dL — ABNORMAL HIGH (ref 70–99)
Potassium: 4.5 mEq/L (ref 3.5–5.1)
Sodium: 141 mEq/L (ref 135–145)

## 2022-11-21 MED ORDER — DOXYCYCLINE HYCLATE 100 MG PO TABS: 100.0000 mg | ORAL_TABLET | Freq: Two times a day (BID) | ORAL | 0 refills | Status: AC

## 2022-11-21 NOTE — Assessment & Plan Note (Signed)
Acute on chronic Started her on Lasix 20 mg daily 1 week ago Swelling has improved.  No longer having drainage from her legs and the source are healing Continue elevating legs Continue getting up and walking around regularly Will continue to work on reducing sodium in her diet BMP today Continue Lasix 20 mg daily

## 2022-11-21 NOTE — Assessment & Plan Note (Signed)
Acute She removed the tick herself from her posterior right ear She does not feel the tick was on for very long-it is not engorged Low risk for Lyme disease There is evidence of cellulitis-will treat with doxycycline 100 mg twice daily x 10 days She will monitor the area closely monitor there is any questions or concerns Discussed tick prevention and symptoms of tick diseases

## 2022-11-21 NOTE — Assessment & Plan Note (Signed)
Acute Tick bite behind right ear-she did remove the tick herself Tick was not engorged so Lyme disease is unlikely There is evidence of cellulitis Start doxycycline 100 mg twice daily x 10 days Monitor area closely and call with any concerns Reviewed symptoms of lyme Discussed tick prevention

## 2022-11-21 NOTE — Assessment & Plan Note (Signed)
Chronic Blood pressure controlled Continue amlodipine 5 mg daily, hydralazine 25 mg twice daily, losartan 100 mg daily and metoprolol XL 100 mg daily

## 2022-11-22 ENCOUNTER — Other Ambulatory Visit: Payer: Self-pay | Admitting: Internal Medicine

## 2022-12-05 ENCOUNTER — Encounter: Payer: Self-pay | Admitting: *Deleted

## 2022-12-05 NOTE — Progress Notes (Signed)
Pt attended 11/10/22 screening event where her b/p was 153/78 and her blood sugar was 225. At the event, the pt confirmed her PCP is Dr. Cheryll Cockayne at [facility], and pt noted that she did not have any SDOH insecurities. Chart review indicates that pt was seen by her PCP Dr. Lawerance Bach on 11/14/22 when her b/p was 128/60 and again on 11/21/22 when her b/p was 130/76. She also saw her Endocrinologist Dr. Elvera Lennox on 11/13/22 and her b/p then was 138/70. No additional health equity team support is indicated at this time.

## 2022-12-09 ENCOUNTER — Other Ambulatory Visit: Payer: Self-pay | Admitting: Internal Medicine

## 2022-12-13 DIAGNOSIS — L609 Nail disorder, unspecified: Secondary | ICD-10-CM | POA: Diagnosis not present

## 2022-12-13 DIAGNOSIS — I739 Peripheral vascular disease, unspecified: Secondary | ICD-10-CM | POA: Diagnosis not present

## 2022-12-13 DIAGNOSIS — M19071 Primary osteoarthritis, right ankle and foot: Secondary | ICD-10-CM | POA: Diagnosis not present

## 2022-12-13 DIAGNOSIS — E1151 Type 2 diabetes mellitus with diabetic peripheral angiopathy without gangrene: Secondary | ICD-10-CM | POA: Diagnosis not present

## 2022-12-22 DIAGNOSIS — L601 Onycholysis: Secondary | ICD-10-CM | POA: Diagnosis not present

## 2022-12-24 DIAGNOSIS — I739 Peripheral vascular disease, unspecified: Secondary | ICD-10-CM | POA: Diagnosis not present

## 2023-01-23 DIAGNOSIS — L97219 Non-pressure chronic ulcer of right calf with unspecified severity: Secondary | ICD-10-CM | POA: Diagnosis not present

## 2023-01-23 DIAGNOSIS — I872 Venous insufficiency (chronic) (peripheral): Secondary | ICD-10-CM | POA: Diagnosis not present

## 2023-01-23 DIAGNOSIS — R600 Localized edema: Secondary | ICD-10-CM | POA: Diagnosis not present

## 2023-01-24 NOTE — Addendum Note (Signed)
Addended by: Pollie Meyer on: 01/24/2023 03:07 PM   Modules accepted: Orders

## 2023-01-28 DIAGNOSIS — I679 Cerebrovascular disease, unspecified: Secondary | ICD-10-CM | POA: Diagnosis not present

## 2023-01-28 DIAGNOSIS — I700 Atherosclerosis of aorta: Secondary | ICD-10-CM | POA: Diagnosis not present

## 2023-02-02 ENCOUNTER — Other Ambulatory Visit: Payer: Self-pay | Admitting: Internal Medicine

## 2023-02-07 ENCOUNTER — Other Ambulatory Visit: Payer: Self-pay | Admitting: Internal Medicine

## 2023-02-13 DIAGNOSIS — L97219 Non-pressure chronic ulcer of right calf with unspecified severity: Secondary | ICD-10-CM | POA: Diagnosis not present

## 2023-02-13 DIAGNOSIS — I872 Venous insufficiency (chronic) (peripheral): Secondary | ICD-10-CM | POA: Diagnosis not present

## 2023-02-13 DIAGNOSIS — R600 Localized edema: Secondary | ICD-10-CM | POA: Diagnosis not present

## 2023-02-15 ENCOUNTER — Encounter: Payer: Self-pay | Admitting: Internal Medicine

## 2023-02-18 ENCOUNTER — Ambulatory Visit (INDEPENDENT_AMBULATORY_CARE_PROVIDER_SITE_OTHER): Payer: Medicare Other

## 2023-02-18 VITALS — BP 130/76 | HR 78 | Temp 98.5°F | Ht 66.0 in | Wt 167.0 lb

## 2023-02-18 DIAGNOSIS — I739 Peripheral vascular disease, unspecified: Secondary | ICD-10-CM | POA: Insufficient documentation

## 2023-02-18 DIAGNOSIS — Z0000 Encounter for general adult medical examination without abnormal findings: Secondary | ICD-10-CM | POA: Diagnosis not present

## 2023-02-18 NOTE — Progress Notes (Addendum)
Subjective:   Rebekah Kennedy is a 67 y.o. female who presents for Medicare Annual (Subsequent) preventive examination.  Visit Complete: Virtual  I connected with  Loistine Simas on 02/18/23 by a audio enabled telemedicine application and verified that I am speaking with the correct person using two identifiers.  Patient Location: Home  Provider Location: Office/Clinic  I discussed the limitations of evaluation and management by telemedicine. The patient expressed understanding and agreed to proceed.  Patient Medicare AWV questionnaire was completed by the patient on N/A; I have confirmed that all information answered by patient is correct and no changes since this date.  Cardiac Risk Factors include: advanced age (>88men, >34 women);diabetes mellitus;dyslipidemia;hypertension     Objective:    Today's Vitals   02/18/23 0759  BP: 130/76  Pulse: 78  Temp: 98.5 F (36.9 C)  SpO2: 98%  Weight: 167 lb (75.8 kg)  Height: 5\' 6"  (1.676 m)   Body mass index is 26.95 kg/m. Patient was unable to self-report due to a lack of equipment at home via telehealth.     02/18/2023    8:34 AM 03/12/2022    1:50 PM 12/29/2021    9:35 AM 03/27/2021   11:37 PM 12/02/2020    7:30 AM 03/24/2020    7:44 AM 06/04/2019   10:55 AM  Advanced Directives  Does Patient Have a Medical Advance Directive? No No No No No No No  Would patient like information on creating a medical advance directive? Yes (MAU/Ambulatory/Procedural Areas - Information given)  No - Patient declined  Yes (MAU/Ambulatory/Procedural Areas - Information given)      Current Medications (verified) Outpatient Encounter Medications as of 02/18/2023  Medication Sig   albuterol (PROVENTIL HFA;VENTOLIN HFA) 108 (90 Base) MCG/ACT inhaler Inhale 2 puffs into the lungs every 4 (four) hours as needed for wheezing or shortness of breath.   amLODipine (NORVASC) 5 MG tablet TAKE 1 TABLET BY MOUTH EVERY DAY   ammonium lactate (AMLACTIN) 12 %  lotion Apply 1 application topically as needed for dry skin.   aspirin EC 81 MG EC tablet Take 1 tablet (81 mg total) by mouth daily.   Biotin 5000 MCG TABS Take 10,000 mcg by mouth daily.    calcium-vitamin D (OSCAL WITH D) 500-200 MG-UNIT tablet Take 1 tablet by mouth daily.   Cholecalciferol (VITAMIN D3 PO) Take 2,000 Units by mouth daily.   clopidogrel (PLAVIX) 75 MG tablet TAKE 1 TABLET BY MOUTH EVERY DAY   Continuous Blood Gluc Receiver (FREESTYLE LIBRE 2 READER) DEVI Use as directed to check blood sugar   Continuous Glucose Sensor (FREESTYLE LIBRE 2 SENSOR) MISC USE AS DIRECTED EVERY 2 WEEKS TO MONITOR BLOOD SUGARS AS INSTRUCTED   dapagliflozin propanediol (FARXIGA) 10 MG TABS tablet Take 1 tablet (10 mg total) by mouth daily before breakfast.   furosemide (LASIX) 20 MG tablet TAKE 1 TABLET BY MOUTH EVERY DAY   glucose blood (FREESTYLE PRECISION NEO TEST) test strip Use as instructed   hydrALAZINE (APRESOLINE) 25 MG tablet TAKE 1 TABLET BY MOUTH THREE TIMES A DAY   losartan (COZAAR) 100 MG tablet TAKE 1 TABLET BY MOUTH EVERY DAY   metoprolol succinate (TOPROL-XL) 100 MG 24 hr tablet TAKE 1 TABLET BY MOUTH DAILY WITH OR IMMEDIATELY FOLLOWING A MEAL   montelukast (SINGULAIR) 10 MG tablet TAKE 1 TABLET BY MOUTH EVERY DAY   mupirocin ointment (BACTROBAN) 2 % APPLY TO AFFECTED AREA TWICE A DAY   nystatin-triamcinolone ointment (MYCOLOG) Apply topically as  needed. (Patient taking differently: Apply 1 Application topically daily as needed (itching).)   OVER THE COUNTER MEDICATION Take 1 tablet by mouth daily. Multivitamin centrum 1 daily   prednisoLONE acetate (PRED FORTE) 1 % ophthalmic suspension Place 1 drop into the left eye 4 (four) times daily.   rosuvastatin (CRESTOR) 10 MG tablet TAKE 1 TABLET BY MOUTH EVERY DAY   Semaglutide,0.25 or 0.5MG /DOS, (OZEMPIC, 0.25 OR 0.5 MG/DOSE,) 2 MG/3ML SOPN INJECT 0.5 MG INTO THE SKIN ONE TIME PER WEEK   tacrolimus (PROTOPIC) 0.1 % ointment Apply  topically 2 (two) times daily. (Patient taking differently: Apply 1 Application topically daily as needed (blisters).)   No facility-administered encounter medications on file as of 02/18/2023.    Allergies (verified) Quinapril and Metformin and related   History: 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 issuse resolved   Retinal edema    gets steriod inj in the eyes     Sleep apnea    not needed since 100 pound weight loss 2019-2020   Splenomegaly    in college   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   left side pt states, left wide weak per pt   Stroke [facility]) 2012   right pontine cva with left hemiparesis   Past Surgical History:  Procedure Laterality Date   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   APPENDECTOMY     yrs ago per pt 0n 11-29-2020   BIOPSY THYROID  05/02/2011   Nonneoplastic goiter   BREAST BIOPSY     BREAST LUMPECTOMY WITH RADIOACTIVE SEED LOCALIZATION Left 07/25/2017   Procedure: LEFT BREAST LUMPECTOMY WITH RADIOACTIVE SEED LOCALIZATION;  Surgeon: Abigail Miyamoto, MD;  Location: [facility] OR;  Service: General;  Laterality: Left;   BREAST SURGERY     Reduction   cataracts Bilateral 2012   COLONOSCOPY  2021   COLPOSCOPY     DILATATION & CURETTAGE/HYSTEROSCOPY WITH MYOSURE N/A 12/02/2020   Procedure: DILATATION & CURETTAGE/HYSTEROSCOPY WITH MYOSURE;   Surgeon: Genia Del, MD;  Location: [facility];  Service: Gynecology;  Laterality: N/A;   DILATION AND CURETTAGE OF UTERUS  1975   DUB   EYE SURGERY     Laser for vision at times both eyes done   GASTRIC ROUX-EN-Y N/A 08/12/2017   Procedure: LAPAROSCOPIC ROUX-EN-Y GASTRIC BYPASS WITH HIATAL HERNIA REPAIR AND UPPER ENDOSCOPY;  Surgeon: [name], De Blanch, MD;  Location: [facility] ORS;  Service: General;  Laterality: N/A;   GYNECOLOGIC CRYOSURGERY     MYOMECTOMY     OVARIAN CYST REMOVAL     PELVIC LAPAROSCOPY     DL lysis of adhesions 8119 and 1988   Family History  Problem Relation Age of Onset   Cancer  Maternal Grandmother        Colon Cancer   Asthma Maternal Grandmother    Colon cancer Maternal Grandmother    Diabetes Father    Heart disease Father    Hypertension Father    Hyperlipidemia Father    Stroke Father        passed at age 19 from stroke   Hypertension Mother    Heart disease Mother    Ovarian cancer Mother    Cancer Mother        Lung cancer   Asthma Mother    COPD Mother    Hyperlipidemia Mother    Diabetes Brother    Kidney disease Brother    Hypertension Brother    Luiz Blare' disease Sister    Diabetes Sister    Breast cancer Sister 62   Graves' disease Paternal Grandmother    Hypertension Paternal Grandmother    Heart disease Paternal Grandmother    Alzheimer's disease Paternal Grandmother    Cancer Maternal Grandfather    Heart failure Brother    Prostate cancer Brother    Diabetes Brother    Asthma Brother    Hypertension Brother    Heart failure Brother    Prostate cancer Brother    Stroke Brother    Hypertension Brother    Social History   Socioeconomic History   Marital status: Single    Spouse name: Not on file   Number of children: 2   Years of education: 16   Highest education level: Bachelor's degree (e.g., BA, AB, BS)  Occupational History   Occupation: TEACHER    Employer: [employer]  Tobacco Use    Smoking status: Never   Smokeless tobacco: Never  Vaping Use   Vaping status: Never Used  Substance and Sexual Activity   Alcohol use: Not Currently    Alcohol/week: 0.0 standard drinks of alcohol   Drug use: Never   Sexual activity: Not Currently    Birth control/protection: Post-menopausal    Comment: older than 16, less than 5  Other Topics Concern   Not on file  Social History Narrative   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   Living with daughter      Right handed      4 steps inside of the home      Social Determinants of Health   Financial Resource Strain: Low Risk  (02/18/2023)   Overall Financial Resource Strain (CARDIA)    Difficulty of Paying Living Expenses: Not hard at all  Food Insecurity: No Food Insecurity (02/18/2023)   Hunger Vital Sign    Worried About Running Out of Food in the Last Year: Never true    Ran Out of Food in the Last Year: Never true  Transportation Needs: No Transportation Needs (02/18/2023)   PRAPARE - Administrator, Civil Service (Medical): No    Lack of Transportation (Non-Medical): No  Physical Activity: Insufficiently Active (02/18/2023)   Exercise Vital Sign    Days of Exercise per Week: 3 days    Minutes of Exercise per Session: 30 min  Stress: No Stress Concern Present (02/18/2023)   [employer] of Occupational Health - Occupational Stress Questionnaire    Feeling of Stress : Not at all  Social Connections: Moderately Isolated (02/18/2023)   Social Connection and Isolation Panel [NHANES]    Frequency of Communication with Friends and Family: More than three times a week  Frequency of Social Gatherings with Friends and Family: More than three times a week    Attends Religious Services: More than 4 times per year    Active Member of [employer] or Organizations: No    Attends Engineer, structural: Never    Marital Status: Never married    Tobacco  Counseling Counseling given: Not Answered   Clinical Intake:  Pre-visit preparation completed: Yes  Pain : No/denies pain     BMI - recorded: 27 Nutritional Status: BMI 25 -29 Overweight Nutritional Risks: None Diabetes: Yes CBG done?: No Did pt. bring in CBG monitor from home?: No Nutrition Risk Assessment:  Has the patient had any N/V/D within the last 2 months?  No  Does the patient have any non-healing wounds?   Patient reports ulcers on her leg are on the way to healing Has the patient had any unintentional weight loss or weight gain?  No   Diabetes:  Is the patient diabetic?  Yes  How often do you monitor your CBG's? At least 2-3 times daily.   Financial Strains and Diabetes Management:  Are you having any financial strains with the device, your supplies or your medication? No but patient reports she is waiting on Endocrinology to finish pre-authorization for her Ozempic as she has been waiting 2 weeks.  Does the patient want to be seen by Chronic Care Management for management of their diabetes?  No  Would the patient like to be referred to a Nutritionist or for Diabetic Management?  No   Diabetic Exams:  Diabetic Eye Exam: Completed 2024, obtaining records Diabetic Foot Exam: Overdue, Pt has been advised about the importance in completing this exam. Pt is scheduled for diabetic foot exam on 11/21/2022 was ordered.   How often do you need to have someone help you when you read instructions, pamphlets, or other written materials from your doctor or pharmacy?: 1 - Never What is the last grade level you completed in school?: Masters Degree plus  Interpreter Needed?: No  Information entered by :: Elyse Jarvis, CMA   Activities of Daily Living    02/18/2023    8:34 AM  In your present state of health, do you have any difficulty performing the following activities:  Hearing? 0  Vision? 0  Difficulty concentrating or making decisions? 0  Walking or climbing  stairs? 0  Dressing or bathing? 0  Doing errands, shopping? 0  Preparing Food and eating ? N  Using the Toilet? N  In the past six months, have you accidently leaked urine? Y  Do you have problems with loss of bowel control? N  Managing your Medications? N  Managing your Finances? N  Housekeeping or managing your Housekeeping? N    Patient Care Team: Pincus Sanes, MD as PCP - General (Internal Medicine) Romero Belling, MD (Inactive) (Internal Medicine) Glendale Chard, DO as Consulting Physician (Neurology) Glendale Chard, DO as Consulting Physician (Neurology)  Indicate any recent Medical Services you may have received from other than [facility] providers in the past year (date may be approximate).     Assessment:   This is a routine wellness examination for Aizley.  Hearing/Vision screen Patient denied any hearing difficulty. No hearing aids. Patient does not wear any corrective lenses/contacts.    Goals Addressed               This Visit's Progress     Diabetes Patient stated goal (pt-stated)        I  would like to work on normalizing my A1c and bring it down.        Depression Screen    02/18/2023    8:33 AM 11/14/2022   10:55 AM 07/18/2022    4:18 PM 02/07/2022   10:36 AM 12/29/2021    9:32 AM 11/09/2020    9:09 AM 10/23/2018   11:09 AM  PHQ 2/9 Scores  PHQ - 2 Score 0 0 0 0 0 0 0  PHQ- 9 Score   0        Fall Risk    02/18/2023    8:34 AM 11/14/2022   10:54 AM 07/18/2022    4:18 PM 03/12/2022    1:50 PM 02/07/2022   10:36 AM  Fall Risk   Falls in the past year? 0 0 0 0 0  Number falls in past yr: 0 0 0 0 0  Injury with Fall? 0 0 0 0 0  Risk for fall due to : No Fall Risks No Fall Risks No Fall Risks  No Fall Risks  Follow up Falls evaluation completed Falls evaluation completed Falls evaluation completed  Falls evaluation completed    MEDICARE RISK AT HOME: Medicare Risk at Home Any stairs in or around the home?: Yes If so, are there any without  handrails?: No Home free of loose throw rugs in walkways, pet beds, electrical cords, etc?: Yes Adequate lighting in your home to reduce risk of falls?: Yes (pt states it could be better though) Life alert?: No Use of a cane, walker or w/c?: Yes (cane) Grab bars in the bathroom?: No Shower chair or bench in shower?: Yes Elevated toilet seat or a handicapped toilet?: Yes  TIMED UP AND GO:  Was the test performed?  No    Cognitive Function:  Patient is cogitatively intact. Patient does mention last year she had a screening test done and does have the marker for Alzheimer's and wants that noted.       02/18/2023    8:35 AM 12/29/2021    9:39 AM  6CIT Screen  What Year? 0 points 0 points  What month? 0 points 0 points  What time? 0 points 0 points  Count back from 20 0 points 0 points  Months in reverse 0 points 0 points  Repeat phrase 0 points 0 points  Total Score 0 points 0 points    Immunizations Immunization History  Administered Date(s) Administered   Fluad Quad(high Dose 65+) 02/12/2022   Influenza Split 03/01/2011   Influenza Whole 02/18/2010   Influenza,inj,Quad PF,6+ Mos 07/18/2015, 02/03/2016, 01/22/2018, 02/12/2019, 02/09/2020, 02/10/2021   Influenza-Unspecified 02/18/2017   Moderna Covid-19 Fall Seasonal Vaccine 50yrs & older 08/13/2022   PFIZER(Purple Top)SARS-COV-2 Vaccination 07/30/2019, 08/24/2019   Pneumococcal Polysaccharide-23 07/18/2016   Respiratory Syncytial Virus Vaccine,Recomb Aduvanted(Arexvy) 02/12/2022   Tdap 10/06/2015   Zoster, Live 06/07/2011    TDAP status: Up to date  Flu Vaccine status: Due, Education has been provided regarding the importance of this vaccine. Advised may receive this vaccine at local pharmacy or Health Dept. Aware to provide a copy of the vaccination record if obtained from local pharmacy or Health Dept. Verbalized acceptance and understanding.  Pneumococcal vaccine status: Due, Education has been provided regarding the  importance of this vaccine. Advised may receive this vaccine at local pharmacy or Health Dept. Aware to provide a copy of the vaccination record if obtained from local pharmacy or Health Dept. Verbalized acceptance and understanding.  Covid-19 vaccine status: Completed vaccines  Qualifies  for Shingles Vaccine? Yes   Zostavax completed No   Shingrix Completed?: No.    Education has been provided regarding the importance of this vaccine. Patient has been advised to call insurance company to determine out of pocket expense if they have not yet received this vaccine. Advised may also receive vaccine at local pharmacy or Health Dept. Verbalized acceptance and understanding.  Screening Tests Health Maintenance  Topic Date Due   OPHTHALMOLOGY EXAM  06/26/2019   Pneumonia Vaccine 26+ Years old (2 of 2 - PCV) 08/12/2021   INFLUENZA VACCINE  12/20/2022   COVID-19 Vaccine (4 - 2023-24 season) 03/06/2023 (Originally 01/20/2023)   Zoster Vaccines- Shingrix (1 of 2) 05/20/2023 (Originally 08/13/2006)   HEMOGLOBIN A1C  05/15/2023   Diabetic kidney evaluation - Urine ACR  08/10/2023   FOOT EXAM  11/13/2023   Diabetic kidney evaluation - eGFR measurement  11/21/2023   Medicare Annual Wellness (AWV)  02/18/2024   MAMMOGRAM  10/29/2024   DTaP/Tdap/Td (2 - Td or Tdap) 10/05/2025   Colonoscopy  01/22/2026   DEXA SCAN  Completed   Hepatitis C Screening  Completed   HPV VACCINES  Aged Out    Health Maintenance  Health Maintenance Due  Topic Date Due   OPHTHALMOLOGY EXAM  06/26/2019   Pneumonia Vaccine 32+ Years old (2 of 2 - PCV) 08/12/2021   INFLUENZA VACCINE  12/20/2022    Colorectal cancer screening: Type of screening: Colonoscopy. Completed 01/23/2019. Repeat every 7 years  Mammogram status: Completed 10/30/2022. Repeat every year  Bone Density status: Completed 10/18/2020. Results reflect: Bone density results: OSTEOPENIA. Repeat every N/A years.  Lung Cancer Screening: (Low Dose CT Chest  recommended if Age 73-80 years, 20 pack-year currently smoking OR have quit w/in 15years.) does not qualify.   Lung Cancer Screening Referral: N/A  Additional Screening:  Hepatitis C Screening: does qualify; Completed 01/10/2016  Vision Screening: Recommended annual ophthalmology exams for early detection of glaucoma and other disorders of the eye. Is the patient up to date with their annual eye exam?  Yes  Who is the provider or what is the name of the office in which the patient attends annual eye exams? Ronney Asters at [facility] If pt is not established with a provider, would they like to be referred to a provider to establish care? No .   Dental Screening: Recommended annual dental exams for proper oral hygiene  Diabetic Foot Exam: Diabetic Foot Exam: Overdue, Pt has been advised about the importance in completing this exam. Pt is scheduled for diabetic foot exam on 11/21/2022 ordered.  Community Resource Referral / Chronic Care Management: CRR required this visit?  No   CCM required this visit?  No     Plan:     I have personally reviewed and noted the following in the patient's chart:   Medical and social history Use of alcohol, tobacco or illicit drugs  Current medications and supplements including opioid prescriptions. Patient is not currently taking opioid prescriptions. Functional ability and status Nutritional status Physical activity Advanced directives List of other physicians Hospitalizations, surgeries, and ER visits in previous 12 months Vitals Screenings to include cognitive, depression, and falls Referrals and appointments  In addition, I have reviewed and discussed with patient certain preventive protocols, quality metrics, and best practice recommendations. A written personalized care plan for preventive services as well as general preventive health recommendations were provided to patient.     Marinus Maw, CMA   02/18/2023   After Visit  Summary: (MyChart) Due to this being a telephonic visit, the after visit summary with patients personalized plan was offered to patient via MyChart   Nurse Notes: Advanced Directive information mailed to address on file

## 2023-02-18 NOTE — Patient Instructions (Signed)
It was great speaking with you today!  Please schedule your next Medicare Wellness Visit with your Nurse Health Advisor in 1 year by calling [phone]. 

## 2023-02-19 ENCOUNTER — Other Ambulatory Visit: Payer: Self-pay | Admitting: Internal Medicine

## 2023-02-23 ENCOUNTER — Other Ambulatory Visit: Payer: Self-pay | Admitting: Internal Medicine

## 2023-02-27 ENCOUNTER — Telehealth: Payer: Self-pay

## 2023-02-27 NOTE — Telephone Encounter (Signed)
Pharmacy Patient Advocate Encounter   Received notification from CoverMyMeds that prior authorization for Ozempic is required/requested.   Per test claim: PA required; PA submitted to [facility] via CoverMyMeds Key/confirmation #/EOC [id number] Status is pending

## 2023-02-28 ENCOUNTER — Encounter (HOSPITAL_COMMUNITY): Payer: Self-pay | Admitting: *Deleted

## 2023-02-28 DIAGNOSIS — Z9884 Bariatric surgery status: Secondary | ICD-10-CM | POA: Diagnosis not present

## 2023-02-28 DIAGNOSIS — R5383 Other fatigue: Secondary | ICD-10-CM | POA: Diagnosis not present

## 2023-02-28 DIAGNOSIS — E569 Vitamin deficiency, unspecified: Secondary | ICD-10-CM | POA: Diagnosis not present

## 2023-02-28 DIAGNOSIS — Z1321 Encounter for screening for nutritional disorder: Secondary | ICD-10-CM | POA: Diagnosis not present

## 2023-03-01 NOTE — Telephone Encounter (Signed)
Pharmacy Patient Advocate Encounter  Received notification from [facility] that Prior Authorization for Ozempic has been APPROVED through 02/26/2026

## 2023-03-08 DIAGNOSIS — Z1321 Encounter for screening for nutritional disorder: Secondary | ICD-10-CM | POA: Diagnosis not present

## 2023-03-08 DIAGNOSIS — E569 Vitamin deficiency, unspecified: Secondary | ICD-10-CM | POA: Diagnosis not present

## 2023-03-08 DIAGNOSIS — R5383 Other fatigue: Secondary | ICD-10-CM | POA: Diagnosis not present

## 2023-03-14 DIAGNOSIS — E113512 Type 2 diabetes mellitus with proliferative diabetic retinopathy with macular edema, left eye: Secondary | ICD-10-CM | POA: Diagnosis not present

## 2023-03-14 DIAGNOSIS — E113391 Type 2 diabetes mellitus with moderate nonproliferative diabetic retinopathy without macular edema, right eye: Secondary | ICD-10-CM | POA: Diagnosis not present

## 2023-03-14 DIAGNOSIS — H31092 Other chorioretinal scars, left eye: Secondary | ICD-10-CM | POA: Diagnosis not present

## 2023-03-14 DIAGNOSIS — H3582 Retinal ischemia: Secondary | ICD-10-CM | POA: Diagnosis not present

## 2023-03-18 ENCOUNTER — Ambulatory Visit: Payer: BC Managed Care – PPO | Admitting: Neurology

## 2023-03-19 NOTE — Telephone Encounter (Signed)
Patient is coming by the office, so I can see if we can update her app. We are still not able to view anything.

## 2023-03-27 ENCOUNTER — Telehealth: Payer: Self-pay

## 2023-03-27 NOTE — Telephone Encounter (Signed)
Patient advised to schedule appointment to discuss blood sugars and A1C prior to making adjustments to medications. Patient transferred to front desk to schedule appointment.

## 2023-03-29 ENCOUNTER — Encounter: Payer: Self-pay | Admitting: Internal Medicine

## 2023-03-29 ENCOUNTER — Ambulatory Visit (INDEPENDENT_AMBULATORY_CARE_PROVIDER_SITE_OTHER): Payer: Medicare Other | Admitting: Internal Medicine

## 2023-03-29 VITALS — BP 130/80 | Ht 66.0 in | Wt 162.2 lb

## 2023-03-29 DIAGNOSIS — E1159 Type 2 diabetes mellitus with other circulatory complications: Secondary | ICD-10-CM | POA: Diagnosis not present

## 2023-03-29 DIAGNOSIS — Z7984 Long term (current) use of oral hypoglycemic drugs: Secondary | ICD-10-CM

## 2023-03-29 DIAGNOSIS — E1165 Type 2 diabetes mellitus with hyperglycemia: Secondary | ICD-10-CM

## 2023-03-29 DIAGNOSIS — Z7985 Long-term (current) use of injectable non-insulin antidiabetic drugs: Secondary | ICD-10-CM | POA: Diagnosis not present

## 2023-03-29 DIAGNOSIS — E7849 Other hyperlipidemia: Secondary | ICD-10-CM

## 2023-03-29 LAB — POCT GLYCOSYLATED HEMOGLOBIN (HGB A1C): Hemoglobin A1C: 6.6 % — AB (ref 4.0–5.6)

## 2023-03-29 MED ORDER — DEXCOM G7 SENSOR MISC: 3.0000 | 4 refills | Status: DC

## 2023-03-29 NOTE — Progress Notes (Signed)
Patient ID: Rebekah Kennedy, female   DOB: [birth year], 67 y.o.   MRN: [id number]  HPI: Rebekah Kennedy is a 67 y.o.-year-old female, returning for follow-up for DM2, dx in 2004, non-insulin-dependent, uncontrolled, with complications (CVA, PDR, CKD, PN, severe hypoglycemia in 2018). Pt. previously saw Dr. Everardo All. Last OV with me 4 months ago.  Interim history: She was prev. a whole food plant based diet  - based on Dr. Guy Sandifer book.  She relaxed her diet before last visit, but since last visit she started to improve it again. No nausea, chest pain.   Reviewed HbA1c: Lab Results  Component Value Date   HGBA1C 9.1 (A) 11/13/2022   HGBA1C 7.4 (A) 06/27/2022   HGBA1C 7.6 (A) 02/20/2022   HGBA1C 8.7 (A) 10/13/2021   HGBA1C 8.8 (H) 08/10/2021   HGBA1C 7.4 (H) 02/10/2021   HGBA1C 8.2 (H) 11/09/2020   HGBA1C 8.6 (H) 08/05/2020   HGBA1C 8.0 (H) 04/27/2020   HGBA1C 6.8 (H) 07/24/2019   Pt is on a regimen of: - Januvia >> Rybelsus 14 mg before breakfast >> Ozempic 0.5 mg weekly (changed 10/2022) - Farxiga 10 >> 5 >> 10 mg before breakfast Glyburide every day - started by Dr. Lawerance Bach >> stopped 06/2022 due to CBG 50. Metformin caused diarrhea. She was previously on insulin between 2013 and 2019 before her gastric bypass.  She checks her blood sugars with the freestyle libre CGM: -Unfortunately, we were not able to download the reports although she gave [place] permission on her phone... Per review of her Josephine Igo 2 app: 70% in range  Previously:  Prev.:   Lowest sugar was 56 >> 56; she has hypoglycemia awareness at 70.  Highest sugar was 400 >> 344 >> 300s >> 200s.  Glucometer:One Touch Verio  - no CKD, last BUN/creatinine:  Lab Results  Component Value Date   BUN 26 (H) 11/21/2022   BUN 17 11/14/2022   CREATININE 0.85 11/21/2022   CREATININE 0.76 11/14/2022   Lab Results  Component Value Date   MICRALBCREAT 52.1 (H) 08/10/2022   MICRALBCREAT 30.1 (H) 08/10/2021   MICRALBCREAT  14.4 09/16/2013   MICRALBCREAT 12.4 07/14/2012   MICRALBCREAT 272.3 (H) 09/15/2009   MICRALBCREAT 187.2 (H) 06/17/2007   MICRALBCREAT 34.7 (H) 10/11/2006  On losartan 100 mg daily.  -+ HL; last set of lipids: Lab Results  Component Value Date   CHOL 168 11/14/2022   HDL 76.90 11/14/2022   LDLCALC 73 11/14/2022   LDLDIRECT 126.7 03/24/2013   TRIG 91.0 11/14/2022   CHOLHDL 2 11/14/2022  On Crestor 10 mg daily.  - last eye exam was in 02/2023. + DR reportedly - improved. Finished IO injections for macular edema.  - no numbness and tingling in her feet.  Last foot exam 11/13/2022.  On ASA 81.  She had gastric bypass in 2019.  She lost 80 pounds afterwards. She also has a history of HTN, osteopenia, depression, asthma, obstructive sleep apnea. She is a Runner, broadcasting/film/video.  ROS: + see HPI  Past Medical History:  Diagnosis Date   Ambulates with cane 11/29/2020   outside of home   ASTHMA NOS W/ACUTE EXACERBATION 07/10/2010   Cervical dysplasia    DEPRESSION 03/16/2007   DIABETES MELLITUS, TYPE II 12/01/2006   dx 2004   Dysmenorrhea    Fibroid    History of blood product transfusion    took own blood that was banked in 1990's   HYPERLIPIDEMIA 12/01/2006   HYPERTENSION 07/07/2006   Macular edema  Neuropathy    fingertips and tose some numbness, right foot numb at times   Obesity    Osteopenia 05/2017   T score -1.5 FRAX 2.6% / 0.1%   Pneumonia    1990's and 2000 none since   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 issuse resolved   Retinal edema    gets steriod inj in the eyes     Sleep apnea    not needed since 100 pound weight loss 2019-2020   Splenomegaly    in college   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   left side pt states, left wide weak per pt   Stroke [facility]) 2012   right pontine cva with left hemiparesis   Past Surgical History:  Procedure Laterality Date   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   APPENDECTOMY     yrs ago per pt 0n  11-29-2020   BIOPSY THYROID  05/02/2011   Nonneoplastic goiter   BREAST BIOPSY     BREAST LUMPECTOMY WITH RADIOACTIVE SEED LOCALIZATION Left 07/25/2017   Procedure: LEFT BREAST LUMPECTOMY WITH RADIOACTIVE SEED LOCALIZATION;  Surgeon: Abigail Miyamoto, MD;  Location: [facility] OR;  Service: General;  Laterality: Left;   BREAST SURGERY     Reduction   cataracts Bilateral 2012   COLONOSCOPY  2021   COLPOSCOPY     DILATATION & CURETTAGE/HYSTEROSCOPY WITH MYOSURE N/A 12/02/2020   Procedure: DILATATION & CURETTAGE/HYSTEROSCOPY WITH MYOSURE;  Surgeon: Genia Del, MD;  Location:  SURGERY CENTER;  Service: Gynecology;  Laterality: N/A;   DILATION AND CURETTAGE OF UTERUS  1975   DUB   EYE SURGERY     Laser for vision at times both eyes done   GASTRIC ROUX-EN-Y N/A 08/12/2017   Procedure: LAPAROSCOPIC ROUX-EN-Y GASTRIC BYPASS WITH HIATAL HERNIA REPAIR AND UPPER ENDOSCOPY;  Surgeon: [name], De Blanch, MD;  Location: [facility] ORS;  Service: General;  Laterality: N/A;   GYNECOLOGIC CRYOSURGERY     MYOMECTOMY     OVARIAN CYST REMOVAL     PELVIC LAPAROSCOPY     DL lysis of adhesions 6578 and 1988   Social History   Socioeconomic History   Marital status: Single    Spouse name: Not on file   Number of children: 2   Years of education: 16   Highest education level: Bachelor's degree (e.g., BA, AB, BS)  Occupational History   Occupation: TEACHER    Employer: [employer]  Tobacco Use   Smoking status: Never   Smokeless tobacco: Never  Vaping Use   Vaping status: Never Used  Substance and Sexual Activity   Alcohol use: Not Currently    Alcohol/week: 0.0 standard drinks of alcohol   Drug use: Never   Sexual activity: Not Currently    Birth control/protection: Post-menopausal    Comment: older than 17, less than 5  Other Topics Concern   Not on file  Social History Narrative   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    Living with daughter      Right handed      4 steps inside of the home      Social Determinants of Health   Financial Resource Strain: Low Risk  (02/18/2023)   Overall Financial Resource Strain (CARDIA)    Difficulty of Paying Living Expenses: Not hard at all  Food Insecurity: No Food Insecurity (02/18/2023)   Hunger Vital Sign  Worried About Programme researcher, broadcasting/film/video in the Last Year: Never true    Ran Out of Food in the Last Year: Never true  Transportation Needs: No Transportation Needs (02/18/2023)   PRAPARE - Administrator, Civil Service (Medical): No    Lack of Transportation (Non-Medical): No  Physical Activity: Insufficiently Active (02/18/2023)   Exercise Vital Sign    Days of Exercise per Week: 3 days    Minutes of Exercise per Session: 30 min  Stress: No Stress Concern Present (02/18/2023)   [employer] of Occupational Health - Occupational Stress Questionnaire    Feeling of Stress : Not at all  Social Connections: Moderately Isolated (02/18/2023)   Social Connection and Isolation Panel [NHANES]    Frequency of Communication with Friends and Family: More than three times a week    Frequency of Social Gatherings with Friends and Family: More than three times a week    Attends Religious Services: More than 4 times per year    Active Member of [employer] or Organizations: No    Attends Banker Meetings: Never    Marital Status: Never married  Intimate Partner Violence: Not At Risk (02/18/2023)   Humiliation, Afraid, Rape, and Kick questionnaire    Fear of Current or Ex-Partner: No    Emotionally Abused: No    Physically Abused: No    Sexually Abused: No   Current Outpatient Medications on File Prior to Visit  Medication Sig Dispense Refill   albuterol (PROVENTIL HFA;VENTOLIN HFA) 108 (90 Base) MCG/ACT inhaler Inhale 2 puffs into the lungs every 4 (four) hours as needed for wheezing or shortness of breath. 18 g 2   amLODipine (NORVASC) 5 MG tablet  TAKE 1 TABLET BY MOUTH EVERY DAY 90 tablet 1   ammonium lactate (AMLACTIN) 12 % lotion Apply 1 application topically as needed for dry skin. 400 g 5   aspirin EC 81 MG EC tablet Take 1 tablet (81 mg total) by mouth daily. 30 tablet 0   Biotin 5000 MCG TABS Take 10,000 mcg by mouth daily.      calcium-vitamin D (OSCAL WITH D) 500-200 MG-UNIT tablet Take 1 tablet by mouth daily.     Cholecalciferol (VITAMIN D3 PO) Take 2,000 Units by mouth daily.     clopidogrel (PLAVIX) 75 MG tablet TAKE 1 TABLET BY MOUTH EVERY DAY 90 tablet 1   Continuous Blood Gluc Receiver (FREESTYLE LIBRE 2 READER) DEVI Use as directed to check blood sugar 1 each 1   Continuous Glucose Sensor (FREESTYLE LIBRE 2 SENSOR) MISC USE AS DIRECTED EVERY 2 WEEKS TO MONITOR BLOOD SUGARS AS INSTRUCTED 2 each 3   FARXIGA 10 MG TABS tablet TAKE 1 TABLET BY MOUTH DAILY BEFORE BREAKFAST. 90 tablet 3   furosemide (LASIX) 20 MG tablet TAKE 1 TABLET BY MOUTH EVERY DAY 90 tablet 1   glucose blood (FREESTYLE PRECISION NEO TEST) test strip Use as instructed 100 each 12   hydrALAZINE (APRESOLINE) 25 MG tablet TAKE 1 TABLET BY MOUTH THREE TIMES A DAY 270 tablet 1   losartan (COZAAR) 100 MG tablet TAKE 1 TABLET BY MOUTH EVERY DAY 90 tablet 1   metoprolol succinate (TOPROL-XL) 100 MG 24 hr tablet TAKE 1 TABLET BY MOUTH DAILY WITH OR IMMEDIATELY FOLLOWING A MEAL 90 tablet 1   montelukast (SINGULAIR) 10 MG tablet TAKE 1 TABLET BY MOUTH EVERY DAY 90 tablet 1   mupirocin ointment (BACTROBAN) 2 % APPLY TO AFFECTED AREA TWICE A DAY 22 g  0   nystatin-triamcinolone ointment (MYCOLOG) Apply topically as needed. (Patient taking differently: Apply 1 Application topically daily as needed (itching).) 30 g 4   OVER THE COUNTER MEDICATION Take 1 tablet by mouth daily. Multivitamin centrum 1 daily     prednisoLONE acetate (PRED FORTE) 1 % ophthalmic suspension Place 1 drop into the left eye 4 (four) times daily.     rosuvastatin (CRESTOR) 10 MG tablet TAKE 1 TABLET  BY MOUTH EVERY DAY 90 tablet 3   Semaglutide,0.25 or 0.5MG /DOS, (OZEMPIC, 0.25 OR 0.5 MG/DOSE,) 2 MG/3ML SOPN INJECT 0.5 MG INTO THE SKIN ONE TIME PER WEEK 9 mL 0   tacrolimus (PROTOPIC) 0.1 % ointment Apply topically 2 (two) times daily. (Patient taking differently: Apply 1 Application topically daily as needed (blisters).) 30 g 2   No current facility-administered medications on file prior to visit.   Allergies  Allergen Reactions   Quinapril Cough   Metformin And Related     Loose stool   Family History  Problem Relation Age of Onset   Cancer Maternal Grandmother        Colon Cancer   Asthma Maternal Grandmother    Colon cancer Maternal Grandmother    Diabetes Father    Heart disease Father    Hypertension Father    Hyperlipidemia Father    Stroke Father        passed at age 16 from stroke   Hypertension Mother    Heart disease Mother    Ovarian cancer Mother    Cancer Mother        Lung cancer   Asthma Mother    COPD Mother    Hyperlipidemia Mother    Diabetes Brother    Kidney disease Brother    Hypertension Brother    Luiz Blare' disease Sister    Diabetes Sister    Breast cancer Sister 73   Graves' disease Paternal Grandmother    Hypertension Paternal Grandmother    Heart disease Paternal Grandmother    Alzheimer's disease Paternal Grandmother    Cancer Maternal Grandfather    Heart failure Brother    Prostate cancer Brother    Diabetes Brother    Asthma Brother    Hypertension Brother    Heart failure Brother    Prostate cancer Brother    Stroke Brother    Hypertension Brother    PE: BP 130/80 (BP Location: Left Arm, Patient Position: Sitting, Cuff Size: Normal)   Ht 5\' 6"  (1.676 m)   Wt 162 lb 3.2 oz (73.6 kg)   BMI 26.18 kg/m  Wt Readings from Last 3 Encounters:  03/29/23 162 lb 3.2 oz (73.6 kg)  02/18/23 167 lb (75.8 kg)  11/21/22 167 lb 12.8 oz (76.1 kg)   Constitutional: overweight, in NAD Eyes: EOMI, no exophthalmos ENT: no thyromegaly, no  cervical lymphadenopathy Cardiovascular: RRR, No MRG Respiratory: CTA B Musculoskeletal: no deformities Skin: + B staisis dermatitis rash. 0.5 cm superficial ulcer R shin, violaceous small healed ulcer on L shin Neurological: no tremor with outstretched hands  ASSESSMENT: 1. DM2, non-insulin-dependent, uncontrolled, with  complications - CVA - 06/2018 - CKD - PDR - PN - severe hypoglycemia in 2018  Component     Latest Ref Rng 10/13/2021  Hemoglobin A1C     4.0 - 5.6 % 8.7 !   Glucose     65 - 99 mg/dL 401 (H)   Glucose Fasting, POC     70 - 99 mg/dL 027 !   Islet Cell Ab  Neg:<1:1  Negative   C-Peptide     0.80 - 3.85 ng/mL 1.92   ZNT8 Antibodies     <15 U/mL <10   Glutamic Acid Decarb Ab     <5 IU/mL <5     No insulin deficiency or antipancreatic autoimmunity.  2. HL  PLAN:  1. Patient with longstanding, uncontrolled, type 2 diabetes, on oral antidiabetic regimen with SGLT2 inhibitor and now also weekly GLP-1 receptor agonist, changed from p.o. at last visit.  At that time, HbA1c was much higher, at 9.1% after she relaxed her diet, previously being on a plant-based diet.  Sugars were also much higher, improving overnight but then increasing in a stepwise fashion throughout the day, with the greatest increase after dinner. -At today's visit, sugars improved significantly, with 70% of the blood sugar in range.  She feels that Ozempic is greatly helping.  Based on the significant increase in HbA1c (see below), I did not suggest a change in regimen for now.  We did uninstall and then install again her freestyle libre 2 app but if we are not able to see the reports, I also called in a prescription to the Dexcom G7 sensor to her pharmacy. - I suggested to:  Patient Instructions  Please continue: - Farxiga 10 mg before breakfast - Ozempic 0.5 mg weekly  Please return in 3-4 months.  - we checked her HbA1c: 6.6% (much better) - advised to check sugars at different times of  the day - 4x a day, rotating check times - advised for yearly eye exams >> she is UTD - return to clinic in 3-4 months  2. HL -Reviewed latest lipid panel from 10/2022: LDL above our goal of less than 55, otherwise fractions at goal: Lab Results  Component Value Date   CHOL 168 11/14/2022   HDL 76.90 11/14/2022   LDLCALC 73 11/14/2022   LDLDIRECT 126.7 03/24/2013   TRIG 91.0 11/14/2022   CHOLHDL 2 11/14/2022  -Continues on Crestor 10 mg daily without side effects  Carlus Pavlov, MD PhD [facility] Endocrinology

## 2023-03-29 NOTE — Patient Instructions (Addendum)
Please continue: - Farxiga 10 mg before breakfast - Ozempic 0.5 mg weekly  Please return in 3 months.

## 2023-04-11 ENCOUNTER — Other Ambulatory Visit (HOSPITAL_COMMUNITY): Payer: Self-pay

## 2023-04-11 ENCOUNTER — Telehealth: Payer: Self-pay

## 2023-04-11 NOTE — Telephone Encounter (Signed)
Pharmacy Patient Advocate Encounter   Received notification from CoverMyMeds that prior authorization for Dexcom G7 sensor is required/requested.   Insurance verification completed.   The patient is insured through [facility] .   Per test claim: PA required; PA submitted to above mentioned insurance via CoverMyMeds Key/confirmation #/EOC [id number] Status is pending

## 2023-04-15 NOTE — Telephone Encounter (Signed)
Pharmacy Patient Advocate Encounter  Received notification from [facility] that Prior Authorization for Dexcom G7 sensor has been APPROVED from 04/10/23 to 04/09/24   PA #/Case ID/Reference #: [id number]

## 2023-05-09 NOTE — Telephone Encounter (Signed)
Have not received results yet.

## 2023-05-10 DIAGNOSIS — E1165 Type 2 diabetes mellitus with hyperglycemia: Secondary | ICD-10-CM | POA: Diagnosis not present

## 2023-05-10 DIAGNOSIS — I10 Essential (primary) hypertension: Secondary | ICD-10-CM | POA: Diagnosis not present

## 2023-05-13 ENCOUNTER — Encounter: Payer: Self-pay | Admitting: Internal Medicine

## 2023-05-13 NOTE — Telephone Encounter (Signed)
 Care team updated and letter sent for eye exam notes.

## 2023-05-21 ENCOUNTER — Other Ambulatory Visit: Payer: Self-pay | Admitting: Internal Medicine

## 2023-05-26 ENCOUNTER — Encounter: Payer: Self-pay | Admitting: Internal Medicine

## 2023-05-26 ENCOUNTER — Other Ambulatory Visit: Payer: Self-pay | Admitting: Internal Medicine

## 2023-05-26 DIAGNOSIS — R000 Tachycardia, unspecified: Secondary | ICD-10-CM

## 2023-05-26 DIAGNOSIS — I10 Essential (primary) hypertension: Secondary | ICD-10-CM

## 2023-05-26 NOTE — Progress Notes (Signed)
 Subjective:    Patient ID: Rebekah Kennedy, female    DOB: [birth year], 68 y.o.   MRN: [id number]      HPI Rebekah Kennedy is here for a Physical exam and her chronic medical problems.    Overall doing well.  No concerns.  Medications and allergies reviewed with patient and updated if appropriate.  Current Outpatient Medications on File Prior to Visit  Medication Sig Dispense Refill   albuterol  (PROVENTIL  HFA;VENTOLIN  HFA) 108 (90 Base) MCG/ACT inhaler Inhale 2 puffs into the lungs every 4 (four) hours as needed for wheezing or shortness of breath. 18 g 2   amLODipine  (NORVASC ) 5 MG tablet TAKE 1 TABLET BY MOUTH EVERY DAY 90 tablet 1   ammonium lactate  (AMLACTIN) 12 % lotion Apply 1 application topically as needed for dry skin. 400 g 5   aspirin  EC 81 MG EC tablet Take 1 tablet (81 mg total) by mouth daily. 30 tablet 0   Biotin  5000 MCG TABS Take 10,000 mcg by mouth daily.      calcium -vitamin D  (OSCAL WITH D) 500-200 MG-UNIT tablet Take 1 tablet by mouth daily.     Cholecalciferol (VITAMIN D3 PO) Take 2,000 Units by mouth daily.     clopidogrel  (PLAVIX ) 75 MG tablet TAKE 1 TABLET BY MOUTH EVERY DAY 90 tablet 1   Continuous Glucose Sensor (DEXCOM G7 SENSOR) MISC 3 each by Does not apply route every 30 (thirty) days. Apply 1 sensor every 10 days 9 each 4   FARXIGA  10 MG TABS tablet TAKE 1 TABLET BY MOUTH DAILY BEFORE BREAKFAST. 90 tablet 3   glucose blood (FREESTYLE PRECISION NEO TEST) test strip Use as instructed 100 each 12   hydrALAZINE  (APRESOLINE ) 25 MG tablet TAKE 1 TABLET BY MOUTH THREE TIMES A DAY 270 tablet 1   losartan  (COZAAR ) 100 MG tablet TAKE 1 TABLET BY MOUTH EVERY DAY 90 tablet 1   metoprolol  succinate (TOPROL -XL) 100 MG 24 hr tablet TAKE 1 TABLET BY MOUTH DAILY WITH OR IMMEDIATELY FOLLOWING A MEAL 90 tablet 1   montelukast  (SINGULAIR ) 10 MG tablet TAKE 1 TABLET BY MOUTH EVERY DAY 90 tablet 1   mupirocin  ointment (BACTROBAN ) 2 % APPLY TO AFFECTED AREA TWICE A DAY 22 g 0    nystatin -triamcinolone  ointment (MYCOLOG) Apply topically as needed. (Patient taking differently: Apply 1 Application topically daily as needed (itching).) 30 g 4   OVER THE COUNTER MEDICATION Take 1 tablet by mouth daily. Multivitamin centrum 1 daily     rosuvastatin  (CRESTOR ) 10 MG tablet TAKE 1 TABLET BY MOUTH EVERY DAY 90 tablet 3   Semaglutide ,0.25 or 0.5MG /DOS, (OZEMPIC , 0.25 OR 0.5 MG/DOSE,) 2 MG/3ML SOPN INJECT 0.5 MG INTO THE SKIN ONE TIME PER WEEK 6 mL 1   tacrolimus  (PROTOPIC ) 0.1 % ointment Apply topically 2 (two) times daily. (Patient taking differently: Apply 1 Application topically daily as needed (blisters).) 30 g 2   No current facility-administered medications on file prior to visit.    Review of Systems  Constitutional:  Negative for fever.  Eyes:  Negative for visual disturbance.  Respiratory:  Negative for cough, shortness of breath and wheezing.   Cardiovascular:  Negative for chest pain, palpitations and leg swelling.  Gastrointestinal:  Positive for constipation (occ). Negative for abdominal pain, blood in stool and diarrhea.       No gerd  Genitourinary:  Negative for dysuria.  Musculoskeletal:  Negative for arthralgias and back pain.  Skin:  Negative for rash.  Neurological:  Positive  for light-headedness (occ when getting up quick). Negative for headaches.  Psychiatric/Behavioral:  Negative for dysphoric mood. The patient is not nervous/anxious.        Objective:   Vitals:   05/29/23 0832  BP: 132/82  Pulse: 60  Temp: 98.2 F (36.8 C)  SpO2: 96%   Filed Weights   05/29/23 0832  Weight: 158 lb (71.7 kg)   Body mass index is 25.5 kg/m.  BP Readings from Last 3 Encounters:  05/29/23 132/82  03/29/23 130/80  02/18/23 130/76    Wt Readings from Last 3 Encounters:  05/29/23 158 lb (71.7 kg)  03/29/23 162 lb 3.2 oz (73.6 kg)  02/18/23 167 lb (75.8 kg)       Physical Exam Constitutional: She appears well-developed and well-nourished. No  distress.  HENT:  Head: Normocephalic and atraumatic.  Right Ear: External ear normal. Normal ear canal and TM Left Ear: External ear normal.  Normal ear canal and TM Mouth/Throat: Oropharynx is clear and moist.  Eyes: Conjunctivae normal.  Neck: Neck supple. No tracheal deviation present. No thyromegaly present.  No carotid bruit  Cardiovascular: Normal rate, regular rhythm and normal heart sounds.   No murmur heard.  No edema. Pulmonary/Chest: Effort normal and breath sounds normal. No respiratory distress. She has no wheezes. She has no rales.  Breast: deferred   Abdominal: Soft. She exhibits no distension. There is no tenderness.  Lymphadenopathy: She has no cervical adenopathy.  Skin: Skin is warm and dry. She is not diaphoretic.  Psychiatric: She has a normal mood and affect. Her behavior is normal.     Lab Results  Component Value Date   WBC 7.6 11/14/2022   HGB 13.3 11/14/2022   HCT 41.5 11/14/2022   PLT 298.0 11/14/2022   GLUCOSE 190 (H) 11/21/2022   CHOL 168 11/14/2022   TRIG 91.0 11/14/2022   HDL 76.90 11/14/2022   LDLDIRECT 126.7 03/24/2013   LDLCALC 73 11/14/2022   ALT 16 11/14/2022   AST 15 11/14/2022   NA 141 11/21/2022   K 4.5 11/21/2022   CL 103 11/21/2022   CREATININE 0.85 11/21/2022   BUN 26 (H) 11/21/2022   CO2 33 (H) 11/21/2022   TSH 1.28 08/10/2021   INR 0.96 07/07/2018   HGBA1C 6.6 (A) 03/29/2023   MICROALBUR 11.3 (H) 08/10/2022         Assessment & Plan:   Physical exam: Screening blood work  ordered Exercise  walking in warmer weather, going to start using a peddlar  Weight  normal Substance abuse  none   Reviewed recommended immunizations.  Pneumonia and shingles vaccines - had at the pharmacy.  She will try to get us  those reports   Health Maintenance  Topic Date Due   Zoster Vaccines- Shingrix (1 of 2) 08/13/2006   Pneumonia Vaccine 4+ Years old (2 of 2 - PCV) 07/18/2017   OPHTHALMOLOGY EXAM  06/26/2019   Diabetic kidney  evaluation - Urine ACR  08/10/2023   HEMOGLOBIN A1C  09/26/2023   FOOT EXAM  11/13/2023   Diabetic kidney evaluation - eGFR measurement  11/21/2023   Medicare Annual Wellness (AWV)  02/18/2024   MAMMOGRAM  10/29/2024   DTaP/Tdap/Td (2 - Td or Tdap) 10/05/2025   Colonoscopy  01/22/2026   INFLUENZA VACCINE  Completed   DEXA SCAN  Completed   COVID-19 Vaccine  Completed   Hepatitis C Screening  Completed   HPV VACCINES  Aged Out          See Problem  List for Assessment and Plan of chronic medical problems.

## 2023-05-26 NOTE — Patient Instructions (Addendum)
 Blood work was ordered.       Medications changes include :   decreased lasix  to three times a week    An ultrasound of the carotid arteries was ordered and someone will call you to schedule an appointment.     Return in about 6 months (around 11/26/2023) for follow up.    Health Maintenance, Female Adopting a healthy lifestyle and getting preventive care are important in promoting health and wellness. Ask your health care provider about: The right schedule for you to have regular tests and exams. Things you can do on your own to prevent diseases and keep yourself healthy. What should I know about diet, weight, and exercise? Eat a healthy diet  Eat a diet that includes plenty of vegetables, fruits, low-fat dairy products, and lean protein. Do not eat a lot of foods that are high in solid fats, added sugars, or sodium. Maintain a healthy weight Body mass index (BMI) is used to identify weight problems. It estimates body fat based on height and weight. Your health care provider can help determine your BMI and help you achieve or maintain a healthy weight. Get regular exercise Get regular exercise. This is one of the most important things you can do for your health. Most adults should: Exercise for at least 150 minutes each week. The exercise should increase your heart rate and make you sweat (moderate-intensity exercise). Do strengthening exercises at least twice a week. This is in addition to the moderate-intensity exercise. Spend less time sitting. Even light physical activity can be beneficial. Watch cholesterol and blood lipids Have your blood tested for lipids and cholesterol at 67 years of age, then have this test every 5 years. Have your cholesterol levels checked more often if: Your lipid or cholesterol levels are high. You are older than 67 years of age. You are at high risk for heart disease. What should I know about cancer screening? Depending on your health  history and family history, you may need to have cancer screening at various ages. This may include screening for: Breast cancer. Cervical cancer. Colorectal cancer. Skin cancer. Lung cancer. What should I know about heart disease, diabetes, and high blood pressure? Blood pressure and heart disease High blood pressure causes heart disease and increases the risk of stroke. This is more likely to develop in people who have high blood pressure readings or are overweight. Have your blood pressure checked: Every 3-5 years if you are 82-31 years of age. Every year if you are 38 years old or older. Diabetes Have regular diabetes screenings. This checks your fasting blood sugar level. Have the screening done: Once every three years after age 9 if you are at a normal weight and have a low risk for diabetes. More often and at a younger age if you are overweight or have a high risk for diabetes. What should I know about preventing infection? Hepatitis B If you have a higher risk for hepatitis B, you should be screened for this virus. Talk with your health care provider to find out if you are at risk for hepatitis B infection. Hepatitis C Testing is recommended for: Everyone born from [birth year] through 1964. Anyone with known risk factors for hepatitis C. Sexually transmitted infections (STIs) Get screened for STIs, including gonorrhea and chlamydia, if: You are sexually active and are younger than 67 years of age. You are older than 66 years of age and your health care provider tells you that you are  at risk for this type of infection. Your sexual activity has changed since you were last screened, and you are at increased risk for chlamydia or gonorrhea. Ask your health care provider if you are at risk. Ask your health care provider about whether you are at high risk for HIV. Your health care provider may recommend a prescription medicine to help prevent HIV infection. If you choose to take medicine to  prevent HIV, you should first get tested for HIV. You should then be tested every 3 months for as long as you are taking the medicine. Pregnancy If you are about to stop having your period (premenopausal) and you may become pregnant, seek counseling before you get pregnant. Take 400 to 800 micrograms (mcg) of folic acid  every day if you become pregnant. Ask for birth control (contraception) if you want to prevent pregnancy. Osteoporosis and menopause Osteoporosis is a disease in which the bones lose minerals and strength with aging. This can result in bone fractures. If you are 56 years old or older, or if you are at risk for osteoporosis and fractures, ask your health care provider if you should: Be screened for bone loss. Take a calcium  or vitamin D  supplement to lower your risk of fractures. Be given hormone replacement therapy (HRT) to treat symptoms of menopause. Follow these instructions at home: Alcohol  use Do not drink alcohol  if: Your health care provider tells you not to drink. You are pregnant, may be pregnant, or are planning to become pregnant. If you drink alcohol : Limit how much you have to: 0-1 drink a day. Know how much alcohol  is in your drink. In the [place] one drink equals one 12 oz bottle of beer (355 mL), one 5 oz glass of wine (148 mL), or one 1 oz glass of hard liquor (44 mL). Lifestyle Do not use any products that contain nicotine or tobacco. These products include cigarettes, chewing tobacco, and vaping devices, such as e-cigarettes. If you need help quitting, ask your health care provider. Do not use street drugs. Do not share needles. Ask your health care provider for help if you need support or information about quitting drugs. General instructions Schedule regular health, dental, and eye exams. Stay current with your vaccines. Tell your health care provider if: You often feel depressed. You have ever been abused or do not feel safe at  home. Summary Adopting a healthy lifestyle and getting preventive care are important in promoting health and wellness. Follow your health care provider's instructions about healthy diet, exercising, and getting tested or screened for diseases. Follow your health care provider's instructions on monitoring your cholesterol and blood pressure. This information is not intended to replace advice given to you by your health care provider. Make sure you discuss any questions you have with your health care provider. Document Revised: 09/26/2020 Document Reviewed: 09/26/2020 Elsevier Patient Education  2024 [employer].

## 2023-05-29 ENCOUNTER — Ambulatory Visit: Payer: Self-pay | Admitting: Internal Medicine

## 2023-05-29 VITALS — BP 132/82 | HR 60 | Temp 98.2°F | Ht 66.0 in | Wt 158.0 lb

## 2023-05-29 DIAGNOSIS — J4520 Mild intermittent asthma, uncomplicated: Secondary | ICD-10-CM

## 2023-05-29 DIAGNOSIS — E7849 Other hyperlipidemia: Secondary | ICD-10-CM

## 2023-05-29 DIAGNOSIS — Z7985 Long-term (current) use of injectable non-insulin antidiabetic drugs: Secondary | ICD-10-CM

## 2023-05-29 DIAGNOSIS — R600 Localized edema: Secondary | ICD-10-CM

## 2023-05-29 DIAGNOSIS — Z7984 Long term (current) use of oral hypoglycemic drugs: Secondary | ICD-10-CM

## 2023-05-29 DIAGNOSIS — I10 Essential (primary) hypertension: Secondary | ICD-10-CM

## 2023-05-29 DIAGNOSIS — I6523 Occlusion and stenosis of bilateral carotid arteries: Secondary | ICD-10-CM

## 2023-05-29 DIAGNOSIS — E1151 Type 2 diabetes mellitus with diabetic peripheral angiopathy without gangrene: Secondary | ICD-10-CM

## 2023-05-29 DIAGNOSIS — I6930 Unspecified sequelae of cerebral infarction: Secondary | ICD-10-CM

## 2023-05-29 DIAGNOSIS — Z0000 Encounter for general adult medical examination without abnormal findings: Secondary | ICD-10-CM

## 2023-05-29 LAB — COMPREHENSIVE METABOLIC PANEL
ALT: 13 U/L (ref 0–35)
AST: 14 U/L (ref 0–37)
Albumin: 4.1 g/dL (ref 3.5–5.2)
Alkaline Phosphatase: 90 U/L (ref 39–117)
BUN: 13 mg/dL (ref 6–23)
CO2: 32 meq/L (ref 19–32)
Calcium: 9.8 mg/dL (ref 8.4–10.5)
Chloride: 104 meq/L (ref 96–112)
Creatinine, Ser: 0.65 mg/dL (ref 0.40–1.20)
GFR: 91.51 mL/min (ref >60.00)
Glucose, Bld: 126 mg/dL — ABNORMAL HIGH (ref 70–99)
Potassium: 4.8 meq/L (ref 3.5–5.1)
Sodium: 142 meq/L (ref 135–145)
Total Bilirubin: 0.8 mg/dL (ref 0.2–1.2)
Total Protein: 7.3 g/dL (ref 6.0–8.3)

## 2023-05-29 LAB — CBC WITH DIFFERENTIAL/PLATELET
Basophils Absolute: 0.1 10*3/uL (ref 0.0–0.1)
Basophils Relative: 1.2 % (ref 0.0–3.0)
Eosinophils Absolute: 0.3 10*3/uL (ref 0.0–0.7)
Eosinophils Relative: 3.7 % (ref 0.0–5.0)
HCT: 42.1 % (ref 36.0–46.0)
Hemoglobin: 13.8 g/dL (ref 12.0–15.0)
Lymphocytes Relative: 21.9 % (ref 12.0–46.0)
Lymphs Abs: 1.7 10*3/uL (ref 0.7–4.0)
MCHC: 32.8 g/dL (ref 30.0–36.0)
MCV: 84.9 fL (ref 78.0–100.0)
Monocytes Absolute: 0.6 10*3/uL (ref 0.1–1.0)
Monocytes Relative: 7.2 % (ref 3.0–12.0)
Neutro Abs: 5.2 10*3/uL (ref 1.4–7.7)
Neutrophils Relative %: 66 % (ref 43.0–77.0)
Platelets: 309 10*3/uL (ref 150.0–400.0)
RBC: 4.95 Mil/uL (ref 3.87–5.11)
RDW: 13.7 % (ref 11.5–15.5)
WBC: 7.8 10*3/uL (ref 4.0–10.5)

## 2023-05-29 LAB — MICROALBUMIN / CREATININE URINE RATIO
Creatinine,U: 98 mg/dL
Microalb Creat Ratio: 109.7 mg/g — ABNORMAL HIGH (ref 0.0–30.0)
Microalb, Ur: 107.5 mg/dL — ABNORMAL HIGH (ref 0.0–1.9)

## 2023-05-29 LAB — LIPID PANEL
Cholesterol: 164 mg/dL (ref 0–200)
HDL: 73.7 mg/dL (ref >39.00)
LDL Cholesterol: 76 mg/dL (ref 0–99)
NonHDL: 90.36
Total CHOL/HDL Ratio: 2
Triglycerides: 72 mg/dL (ref 0.0–149.0)
VLDL: 14.4 mg/dL (ref 0.0–40.0)

## 2023-05-29 LAB — HEMOGLOBIN A1C: Hgb A1c MFr Bld: 7 % — ABNORMAL HIGH (ref 4.6–6.5)

## 2023-05-29 MED ORDER — FUROSEMIDE 20 MG PO TABS: 20.0000 mg | ORAL_TABLET | ORAL | Status: DC

## 2023-05-29 NOTE — Assessment & Plan Note (Signed)
 Chronic Mild, intermittent Controlled Continue Singulair 10 mg nightly and albuterol inhaler as needed

## 2023-05-29 NOTE — Assessment & Plan Note (Signed)
 History of CVA-residual right-sided weakness Continue Plavix 75 mg daily, aspirin 81 mg daily, Crestor 10 mg daily Blood pressure well controlled Sugars currently well-controlled Encouraged regular activity, healthy diet

## 2023-05-29 NOTE — Assessment & Plan Note (Signed)
 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

## 2023-05-29 NOTE — Assessment & Plan Note (Addendum)
 Chronic Controlled Does not like taking the Lasix  every day because it does cause significant urination and does skip at times Continue Lasix  20 mg - but change to 3 times a week -if controlled can eventually change it to as needed Continue elevating legs Continue getting up and walking around regularly Advise low-sodium diet CMP

## 2023-05-29 NOTE — Assessment & Plan Note (Signed)
 Chronic Blood pressure controlled CMP Continue amlodipine 5 mg daily, hydralazine 25 mg twice daily, losartan 100 mg daily and metoprolol XL 100 mg daily

## 2023-05-29 NOTE — Assessment & Plan Note (Signed)
 Chronic Management per Dr  Elvera Lennox Lab Results  Component Value Date   HGBA1C 6.6 (A) 03/29/2023   Sugars controlled Stressed compliance with  diabetic diet Stressed regular exercise Check urine microalbumin

## 2023-05-29 NOTE — Assessment & Plan Note (Signed)
Chronic Regular exercise and healthy diet encouraged Check lipid panel  Continue Crestor 10 mg daily 

## 2023-06-04 ENCOUNTER — Ambulatory Visit (INDEPENDENT_AMBULATORY_CARE_PROVIDER_SITE_OTHER): Payer: Medicare PPO | Admitting: Neurology

## 2023-06-04 VITALS — BP 150/76 | Ht 66.0 in | Wt 162.0 lb

## 2023-06-04 DIAGNOSIS — I6381 Other cerebral infarction due to occlusion or stenosis of small artery: Secondary | ICD-10-CM

## 2023-06-04 DIAGNOSIS — E1142 Type 2 diabetes mellitus with diabetic polyneuropathy: Secondary | ICD-10-CM | POA: Diagnosis not present

## 2023-06-04 NOTE — Progress Notes (Signed)
 Follow-up Visit   Date: 06/04/23    Rebekah Kennedy MRN: [id number] DOB: 1956-01-22   Interim History: Rebekah Kennedy is a 68 y.o. right-handed African American female with history of uncontrolled diabetes mellitus (dx 2008, HbA1c 8.2), hyperlipidemia, hypertension, depression, and right pontine stroke (2012, mild left residual weakness) returning to the clinic with left thalamic stroke (06/2018) and right foot drop.  The patient was accompanied to the clinic by self.  History of present illness: UPDATE 12/27/2017:  She is here with new complaints of right >> left foot numbness over the top of the foot and toes, which started in June.  Symptoms are constant without identifiable triggers.  She has also noticed weakness with extending her toe on the right and slight dragging of the foot.  She does not have weakness of the left foot, but has noticed intermittent numbness.  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  Imaging did not show impingement of the L5 nerve, which would explain her foot drop.  She continues to have foot weakness and frequently trips.  She has started to use a cane for balance, which helps.  UPDATE 07/21/2018:  She suffered a left thalamic stroke on 2/17 manifesting with right numbness/tingling, dizziness, imbalance and fall.  She was started on aspirin  81 mg and Plavix  75 mg daily.  Over the past 2 weeks, she has noticed improved balance, but continues to have lack of coordination of the right hand.  She has been using a walker and is working with physical therapy.  Her right foot weakness has improved and she has been able to walk without using her AFO.  She continues to have some numbness and tingling of the right lower extremity.  UPDATE 02/09/2019:  She is here for follow-up.  She is doing well and feels that her gait and coordination  has improved.  She is walking without AFO and uses a cane for stability.  Fortunately, she has not suffered any falls. Her blood pressure and diabetes is under much better control.  She will be retiring October 1st and has been on disability since her stroke.  Numbness and tingling remains stable in the feet and fingertips.  UPDATE 03/12/2022:  She is here for follow-up visit.  She is doing well and has been trying to get more healthy.  She is on a plant-based diet and lost 7lb.  She continues to have intermittent tremor of the right hand nad lack of coordination, which is stable.  Balance is stable, assisted with cane.  No interval TIA, stroke, or new neurological complaints.   UPDATE 06/04/2023:  She is here for follow-up visit.  She has been doing well over the past year.  No new neurological complaints.  No interval TIA or stroke.  Neuropathy is stable in the feet with numbness/tingling.  She does not have any pain.  No interval falls.     Medications:  Current Outpatient Medications on File Prior to Visit  Medication Sig Dispense Refill   albuterol  (PROVENTIL  HFA;VENTOLIN  HFA) 108 (90 Base) MCG/ACT inhaler Inhale 2 puffs into the lungs every 4 (four) hours as needed for wheezing or shortness of breath. 18 g 2   amLODipine  (NORVASC ) 5 MG tablet TAKE 1 TABLET BY MOUTH EVERY DAY 90 tablet 1   ammonium lactate  (AMLACTIN) 12 % lotion Apply 1 application topically as  needed for dry skin. 400 g 5   aspirin  EC 81 MG EC tablet Take 1 tablet (81 mg total) by mouth daily. 30 tablet 0   Biotin  5000 MCG TABS Take 10,000 mcg by mouth daily.      calcium -vitamin D  (OSCAL WITH D) 500-200 MG-UNIT tablet Take 1 tablet by mouth daily.     Cholecalciferol (VITAMIN D3 PO) Take 2,000 Units by mouth daily.     clopidogrel  (PLAVIX ) 75 MG tablet TAKE 1 TABLET BY MOUTH EVERY DAY 90 tablet 1   Continuous Glucose Sensor (DEXCOM G7 SENSOR) MISC 3 each by Does not apply route every 30 (thirty) days. Apply 1 sensor every 10  days 9 each 4   FARXIGA  10 MG TABS tablet TAKE 1 TABLET BY MOUTH DAILY BEFORE BREAKFAST. 90 tablet 3   furosemide  (LASIX ) 20 MG tablet Take 1 tablet (20 mg total) by mouth 3 (three) times a week.     glucose blood (FREESTYLE PRECISION NEO TEST) test strip Use as instructed 100 each 12   hydrALAZINE  (APRESOLINE ) 25 MG tablet TAKE 1 TABLET BY MOUTH THREE TIMES A DAY 270 tablet 1   losartan  (COZAAR ) 100 MG tablet TAKE 1 TABLET BY MOUTH EVERY DAY 90 tablet 1   metoprolol  succinate (TOPROL -XL) 100 MG 24 hr tablet TAKE 1 TABLET BY MOUTH DAILY WITH OR IMMEDIATELY FOLLOWING A MEAL 90 tablet 1   montelukast  (SINGULAIR ) 10 MG tablet TAKE 1 TABLET BY MOUTH EVERY DAY 90 tablet 1   mupirocin  ointment (BACTROBAN ) 2 % APPLY TO AFFECTED AREA TWICE A DAY 22 g 0   nystatin -triamcinolone  ointment (MYCOLOG) Apply topically as needed. (Patient taking differently: Apply 1 Application topically daily as needed (itching).) 30 g 4   OVER THE COUNTER MEDICATION Take 1 tablet by mouth daily. Multivitamin centrum 1 daily     rosuvastatin  (CRESTOR ) 10 MG tablet TAKE 1 TABLET BY MOUTH EVERY DAY 90 tablet 3   Semaglutide ,0.25 or 0.5MG /DOS, (OZEMPIC , 0.25 OR 0.5 MG/DOSE,) 2 MG/3ML SOPN INJECT 0.5 MG INTO THE SKIN ONE TIME PER WEEK 6 mL 1   tacrolimus  (PROTOPIC ) 0.1 % ointment Apply topically 2 (two) times daily. (Patient taking differently: Apply 1 Application topically daily as needed (blisters).) 30 g 2   No current facility-administered medications on file prior to visit.    Allergies:  Allergies  Allergen Reactions   Quinapril Cough   Metformin And Related     Loose stool     Vital Signs:  BP (!) 150/76   Ht 5' 6 (1.676 m)   Wt 162 lb (73.5 kg)   SpO2 100%   BMI 26.15 kg/m   Neurological Exam: MENTAL STATUS including orientation to time, place, person, recent and remote memory, attention span and concentration, language, and fund of knowledge is normal.  Speech is not dysarthric.  CRANIAL NERVES:   Extraocular muscles intact. No ptosis.  Face is symmetric.    MOTOR:  No atrophy, no fasciculations or abnormal movements.  No pronator drift.  Tone is normal.   Right Upper Extremity:    Left Upper Extremity:    Deltoid  5/5   Deltoid  5/5   Biceps  5/5   Biceps  5/5   Triceps  5/5   Triceps  5/5   Finger extensors  5/5   Finger extensors  5/5   Finger flexors  5/5   Finger flexors  5/5   Dorsal interossei  5/5   Dorsal interossei  5/5   Abductor pollicis  5/5   Abductor pollicis  5/5   Tone (Ashworth scale)  0  Tone (Ashworth scale)  0   Right Lower Extremity:    Left Lower Extremity:    Hip flexors  5/5   Hip flexors  5/5   Dorsiflexors  5/5   Dorsiflexors  5/5   Plantarflexors  5/5   Plantarflexors  5/5   Toe extensors  5/5   Toe extensors  5/5   Toe flexors  5/5   Toe flexors  5/5   Tone (Ashworth scale)  0  Tone (Ashworth scale)  0   SENSATION:  Reduced vibration in the feet  COORDINATION/GAIT:  Trace dysmetria with finger to nose testing on the right.  Intact rapid alternating movements bilaterally.  Gait is assisted with a cane, stable.    Data: Lab Results  Component Value Date   CHOL 164 05/29/2023   HDL 73.70 05/29/2023   LDLCALC 76 05/29/2023   LDLDIRECT 126.7 03/24/2013   TRIG 72.0 05/29/2023   CHOLHDL 2 05/29/2023   Lab Results  Component Value Date   HGBA1C 7.0 (H) 05/29/2023   MRI brain 04/2011: 1. Acute lacunar type infarcts in the right mid brain and pons. No mass effect or hemorrhage.   2. Superimposed chronic lacunar infarct in the right paracentral pons. These findings indicate acute on chronic small vessel ischemia.   3. Otherwise mild for age nonspecific cerebral white matter signal changes.   4. Intracranial MRA findings are below.  MRA brain 04/2011: 1. 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 No major branch occlusion. There is irregularity suggesting atherosclerosis in the right proximal PCA.   2. Mild  anterior circulation atherosclerosis. No stenosis or major branch occlusion.  MRI lumbar spine 02/28/2018: 1. Mild disc bulging and facet hypertrophy at L3-4, L4-5, and L5-S1 contributes to mild foraminal narrowing. 2. No other significant stenosis is evident to explain foot drop.  MRI/A brain wo contrast 07/07/2018:  Small area of acute infarction left anterior thalamus.  Mild chronic microvascular ischemic changes in the white matter.  Intracranial atherosclerotic disease as above. Negative for emergent large vessel occlusion.  TTE 07/08/2018:  EF 60-65%, no PFO Lab Results  Component Value Date   CHOL 164 05/29/2023   HDL 73.70 05/29/2023   LDLCALC 76 05/29/2023   LDLDIRECT 126.7 03/24/2013   TRIG 72.0 05/29/2023   CHOLHDL 2 05/29/2023   Lab Results  Component Value Date   HGBA1C 7.0 (H) 05/29/2023     IMPRESSION/PLAN: Multifactorial gait instability - She is compliant with using a cane - Fall precautions discussed  2.  Diabetic polyneuropathy, history of R peroneal mononeuropathy (weakness has improved)  - Encouraged better diabetes control  - Minimal paresthesias, no role for medications such as gabapentin   3.  Cerebrovascular disease with history of R pontine stroke (2012) and left thalamic stroke with R side ataxia  - Continua ASA 81mg  and plavix  75mg  daily  - Continue Crestor  10mg  (LDL 76)  - Diabetes and hypertension management per primary   Return to clinic in 1 year   Thank you for allowing me to participate in patient's care.  If I can answer any additional questions, I would be pleased to do so.    Sincerely,    Soniyah Mcglory K. Tobie, DO

## 2023-06-17 ENCOUNTER — Encounter: Payer: Self-pay | Admitting: Internal Medicine

## 2023-06-17 ENCOUNTER — Ambulatory Visit (HOSPITAL_COMMUNITY)
Admission: RE | Admit: 2023-06-17 | Discharge: 2023-06-17 | Disposition: A | Discharge status: Home / Self Care | Payer: Medicare PPO | Source: Ambulatory Visit | Attending: Cardiology | Admitting: Cardiology

## 2023-06-17 DIAGNOSIS — I6523 Occlusion and stenosis of bilateral carotid arteries: Secondary | ICD-10-CM | POA: Insufficient documentation

## 2023-07-04 ENCOUNTER — Encounter: Payer: Self-pay | Admitting: Internal Medicine

## 2023-07-04 ENCOUNTER — Ambulatory Visit: Payer: Medicare PPO | Admitting: Internal Medicine

## 2023-07-04 VITALS — BP 124/70 | HR 82 | Ht 66.0 in | Wt 157.8 lb

## 2023-07-04 DIAGNOSIS — E7849 Other hyperlipidemia: Secondary | ICD-10-CM

## 2023-07-04 DIAGNOSIS — Z7985 Long-term (current) use of injectable non-insulin antidiabetic drugs: Secondary | ICD-10-CM

## 2023-07-04 DIAGNOSIS — E1165 Type 2 diabetes mellitus with hyperglycemia: Secondary | ICD-10-CM | POA: Diagnosis not present

## 2023-07-04 DIAGNOSIS — E1159 Type 2 diabetes mellitus with other circulatory complications: Secondary | ICD-10-CM | POA: Diagnosis not present

## 2023-07-04 MED ORDER — ROSUVASTATIN CALCIUM 20 MG PO TABS: 20.0000 mg | ORAL_TABLET | Freq: Every day | ORAL | 3 refills | Status: DC

## 2023-07-04 MED ORDER — OZEMPIC (1 MG/DOSE) 4 MG/3ML ~~LOC~~ SOPN: 1.0000 mg | PEN_INJECTOR | SUBCUTANEOUS | 3 refills | Status: DC

## 2023-07-04 NOTE — Patient Instructions (Addendum)
Please continue: - Farxiga 10 mg before breakfast  Please increase: - Ozempic 1 mg weekly  Please increase: - Crestor 20 mg daily  Please return in 3 months.

## 2023-07-04 NOTE — Progress Notes (Signed)
 Patient ID: Rebekah Kennedy, female   DOB: 01-26-1956, 68 y.o.   MRN: [id number]  HPI: Rebekah Kennedy is a 68 y.o.-year-old female, returning for follow-up for DM2, dx in 2004, non-insulin-dependent, uncontrolled, with complications (CVA, PDR, CKD, PN, severe hypoglycemia in 2018). Pt. previously saw Dr. Everardo All. Last OV with me 3 months ago.  Interim history: She is back on a whole food plant based diet  - based on Dr. Guy Sandifer book. No nausea, chest pain, blurry vision, increased urination.  Reviewed HbA1c: Lab Results  Component Value Date   HGBA1C 7.0 (H) 05/29/2023   HGBA1C 6.6 (A) 03/29/2023   HGBA1C 9.1 (A) 11/13/2022   HGBA1C 7.4 (A) 06/27/2022   HGBA1C 7.6 (A) 02/20/2022   HGBA1C 8.7 (A) 10/13/2021   HGBA1C 8.8 (H) 08/10/2021   HGBA1C 7.4 (H) 02/10/2021   HGBA1C 8.2 (H) 11/09/2020   HGBA1C 8.6 (H) 08/05/2020   Pt is on a regimen of: - Januvia >> Rybelsus 14 mg before breakfast >> Ozempic 0.5 mg weekly (changed 10/2022) - Farxiga 10 >> 5 >> 10 mg before breakfast Glyburide every day - started by Dr. Lawerance Bach >> stopped 06/2022 due to CBG 50. Metformin caused diarrhea. She was previously on insulin between 2013 and 2019 before her gastric bypass.  She checks her blood sugars with the Dexcom CGM:    Previously: 70% in range  Previously:  Prev.:   Lowest sugar was 56 >> 56 >> 88; she has hypoglycemia awareness at 70.  Highest sugar was 400 >> .Marland KitchenMarland Kitchen 300s >> 200s >> 390s.  Glucometer:One Touch Verio  - no CKD, last BUN/creatinine:  Lab Results  Component Value Date   BUN 13 05/29/2023   BUN 26 (H) 11/21/2022   CREATININE 0.65 05/29/2023   CREATININE 0.85 11/21/2022   Lab Results  Component Value Date   MICRALBCREAT 109.7 (H) 05/29/2023   MICRALBCREAT 52.1 (H) 08/10/2022   MICRALBCREAT 30.1 (H) 08/10/2021   MICRALBCREAT 14.4 09/16/2013   MICRALBCREAT 12.4 07/14/2012   MICRALBCREAT 272.3 (H) 09/15/2009   MICRALBCREAT 187.2 (H) 06/17/2007   MICRALBCREAT  34.7 (H) 10/11/2006  On losartan 100 mg daily.  -+ HL; last set of lipids: Lab Results  Component Value Date   CHOL 164 05/29/2023   HDL 73.70 05/29/2023   LDLCALC 76 05/29/2023   LDLDIRECT 126.7 03/24/2013   TRIG 72.0 05/29/2023   CHOLHDL 2 05/29/2023  On Crestor 10 mg daily.  - last eye exam was in 02/2023. + DR reportedly - improved. Finished IO injections for macular edema.  - no numbness and tingling in her feet.  Last foot exam 11/13/2022.  On ASA 81.  She had gastric bypass in 2019.  She lost 80 pounds afterwards. She also has a history of HTN, osteopenia, depression, asthma, obstructive sleep apnea. She is a Runner, broadcasting/film/video.  ROS: + see HPI  Past Medical History:  Diagnosis Date   Ambulates with cane 11/29/2020   outside of home   ASTHMA NOS W/ACUTE EXACERBATION 07/10/2010   Cervical dysplasia    DEPRESSION 03/16/2007   DIABETES MELLITUS, TYPE II 12/01/2006   dx 2004   Dysmenorrhea    Fibroid    History of blood product transfusion    took own blood that was banked in 1990's   HYPERLIPIDEMIA 12/01/2006   HYPERTENSION 07/07/2006   Macular edema    Neuropathy    fingertips and tose some numbness, right foot numb at times   Obesity    Osteopenia 05/2017   T score -  1.5 FRAX 2.6% / 0.1%   Pneumonia    1990's and 2000 none since   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 issuse resolved   Retinal edema    gets steriod inj in the eyes     Sleep apnea    not needed since 100 pound weight loss 2019-2020   Splenomegaly    in college   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   left side pt states, left wide weak per pt   Stroke [facility]) 2012   right pontine cva with left hemiparesis   Past Surgical History:  Procedure Laterality Date   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   APPENDECTOMY     yrs ago per pt 0n 11-29-2020   BIOPSY THYROID  05/02/2011   Nonneoplastic goiter   BREAST BIOPSY     BREAST LUMPECTOMY WITH RADIOACTIVE SEED LOCALIZATION  Left 07/25/2017   Procedure: LEFT BREAST LUMPECTOMY WITH RADIOACTIVE SEED LOCALIZATION;  Surgeon: Abigail Miyamoto, MD;  Location: [facility];  Service: General;  Laterality: Left;   BREAST SURGERY     Reduction   cataracts Bilateral 2012   COLONOSCOPY  2021   COLPOSCOPY     DILATATION & CURETTAGE/HYSTEROSCOPY WITH MYOSURE N/A 12/02/2020   Procedure: DILATATION & CURETTAGE/HYSTEROSCOPY WITH MYOSURE;  Surgeon: Genia Del, MD;  Location: [facility];  Service: Gynecology;  Laterality: N/A;   DILATION AND CURETTAGE OF UTERUS  1975   DUB   EYE SURGERY     Laser for vision at times both eyes done   GASTRIC ROUX-EN-Y N/A 08/12/2017   Procedure: LAPAROSCOPIC ROUX-EN-Y GASTRIC BYPASS WITH HIATAL HERNIA REPAIR AND UPPER ENDOSCOPY;  Surgeon: [name], De Blanch, MD;  Location: [facility] ORS;  Service: General;  Laterality: N/A;   GYNECOLOGIC CRYOSURGERY     MYOMECTOMY     OVARIAN CYST REMOVAL     PELVIC LAPAROSCOPY     DL lysis of adhesions 7846 and 1988   Social History   Socioeconomic History   Marital status: Single    Spouse name: Not on file   Number of children: 2   Years of education: 16   Highest education level: Bachelor's degree (e.g., BA, AB, BS)  Occupational History   Occupation: TEACHER    Employer: [employer]  Tobacco Use   Smoking status: Never   Smokeless tobacco: Never  Vaping Use   Vaping status: Never Used  Substance and Sexual Activity   Alcohol use: Not Currently    Alcohol/week: 0.0 standard drinks of alcohol   Drug use: Never   Sexual activity: Not Currently    Birth control/protection: Post-menopausal    Comment: older than 17, less than 5  Other Topics Concern   Not on file  Social History Narrative   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   Living with daughter      Right handed      4 steps inside of the home      Social Drivers of Health   Financial Resource  Strain: Low Risk  (02/18/2023)   Overall Financial Resource Strain (CARDIA)    Difficulty of Paying Living Expenses: Not hard at all  Food Insecurity: No Food Insecurity (02/18/2023)   Hunger Vital Sign    Worried About Running Out of Food in the Last Year: Never true    Ran Out of Food in the Last Year:  Never true  Transportation Needs: No Transportation Needs (02/18/2023)   PRAPARE - Administrator, Civil Service (Medical): No    Lack of Transportation (Non-Medical): No  Physical Activity: Insufficiently Active (02/18/2023)   Exercise Vital Sign    Days of Exercise per Week: 3 days    Minutes of Exercise per Session: 30 min  Stress: No Stress Concern Present (02/18/2023)   [employer] of Occupational Health - Occupational Stress Questionnaire    Feeling of Stress : Not at all  Social Connections: Moderately Isolated (02/18/2023)   Social Connection and Isolation Panel [NHANES]    Frequency of Communication with Friends and Family: More than three times a week    Frequency of Social Gatherings with Friends and Family: More than three times a week    Attends Religious Services: More than 4 times per year    Active Member of [employer] or Organizations: No    Attends Banker Meetings: Never    Marital Status: Never married  Intimate Partner Violence: Not At Risk (02/18/2023)   Humiliation, Afraid, Rape, and Kick questionnaire    Fear of Current or Ex-Partner: No    Emotionally Abused: No    Physically Abused: No    Sexually Abused: No   Current Outpatient Medications on File Prior to Visit  Medication Sig Dispense Refill   albuterol (PROVENTIL HFA;VENTOLIN HFA) 108 (90 Base) MCG/ACT inhaler Inhale 2 puffs into the lungs every 4 (four) hours as needed for wheezing or shortness of breath. 18 g 2   amLODipine (NORVASC) 5 MG tablet TAKE 1 TABLET BY MOUTH EVERY DAY 90 tablet 1   ammonium lactate (AMLACTIN) 12 % lotion Apply 1 application topically as needed for dry  skin. 400 g 5   aspirin EC 81 MG EC tablet Take 1 tablet (81 mg total) by mouth daily. 30 tablet 0   Biotin 5000 MCG TABS Take 10,000 mcg by mouth daily.      calcium-vitamin D (OSCAL WITH D) 500-200 MG-UNIT tablet Take 1 tablet by mouth daily.     Cholecalciferol (VITAMIN D3 PO) Take 2,000 Units by mouth daily.     clopidogrel (PLAVIX) 75 MG tablet TAKE 1 TABLET BY MOUTH EVERY DAY 90 tablet 1   Continuous Glucose Sensor (DEXCOM G7 SENSOR) MISC 3 each by Does not apply route every 30 (thirty) days. Apply 1 sensor every 10 days 9 each 4   FARXIGA 10 MG TABS tablet TAKE 1 TABLET BY MOUTH DAILY BEFORE BREAKFAST. 90 tablet 3   furosemide (LASIX) 20 MG tablet Take 1 tablet (20 mg total) by mouth 3 (three) times a week.     glucose blood (FREESTYLE PRECISION NEO TEST) test strip Use as instructed 100 each 12   hydrALAZINE (APRESOLINE) 25 MG tablet TAKE 1 TABLET BY MOUTH THREE TIMES A DAY 270 tablet 1   losartan (COZAAR) 100 MG tablet TAKE 1 TABLET BY MOUTH EVERY DAY 90 tablet 1   metoprolol succinate (TOPROL-XL) 100 MG 24 hr tablet TAKE 1 TABLET BY MOUTH DAILY WITH OR IMMEDIATELY FOLLOWING A MEAL 90 tablet 1   montelukast (SINGULAIR) 10 MG tablet TAKE 1 TABLET BY MOUTH EVERY DAY 90 tablet 1   mupirocin ointment (BACTROBAN) 2 % APPLY TO AFFECTED AREA TWICE A DAY 22 g 0   nystatin-triamcinolone ointment (MYCOLOG) Apply topically as needed. (Patient taking differently: Apply 1 Application topically daily as needed (itching).) 30 g 4   OVER THE COUNTER MEDICATION Take 1 tablet by mouth  daily. Multivitamin centrum 1 daily     rosuvastatin (CRESTOR) 10 MG tablet TAKE 1 TABLET BY MOUTH EVERY DAY 90 tablet 3   Semaglutide,0.25 or 0.5MG /DOS, (OZEMPIC, 0.25 OR 0.5 MG/DOSE,) 2 MG/3ML SOPN INJECT 0.5 MG INTO THE SKIN ONE TIME PER WEEK 6 mL 1   tacrolimus (PROTOPIC) 0.1 % ointment Apply topically 2 (two) times daily. (Patient taking differently: Apply 1 Application topically daily as needed (blisters).) 30 g 2    No current facility-administered medications on file prior to visit.   Allergies  Allergen Reactions   Quinapril Cough   Metformin And Related     Loose stool   Family History  Problem Relation Age of Onset   Cancer Maternal Grandmother        Colon Cancer   Asthma Maternal Grandmother    Colon cancer Maternal Grandmother    Diabetes Father    Heart disease Father    Hypertension Father    Hyperlipidemia Father    Stroke Father        passed at age 40 from stroke   Hypertension Mother    Heart disease Mother    Ovarian cancer Mother    Cancer Mother        Lung cancer   Asthma Mother    COPD Mother    Hyperlipidemia Mother    Diabetes Brother    Kidney disease Brother    Hypertension Brother    Luiz Blare' disease Sister    Diabetes Sister    Breast cancer Sister 51   Graves' disease Paternal Grandmother    Hypertension Paternal Grandmother    Heart disease Paternal Grandmother    Alzheimer's disease Paternal Grandmother    Cancer Maternal Grandfather    Heart failure Brother    Prostate cancer Brother    Diabetes Brother    Asthma Brother    Hypertension Brother    Heart failure Brother    Prostate cancer Brother    Stroke Brother    Hypertension Brother    PE: BP 124/70   Pulse 82   Ht 5\' 6"  (1.676 m)   Wt 157 lb 12.8 oz (71.6 kg)   SpO2 96%   BMI 25.47 kg/m  Wt Readings from Last 10 Encounters:  07/04/23 157 lb 12.8 oz (71.6 kg)  06/04/23 162 lb (73.5 kg)  05/29/23 158 lb (71.7 kg)  03/29/23 162 lb 3.2 oz (73.6 kg)  02/18/23 167 lb (75.8 kg)  11/21/22 167 lb 12.8 oz (76.1 kg)  11/14/22 173 lb (78.5 kg)  11/13/22 175 lb 6.4 oz (79.6 kg)  07/18/22 166 lb (75.3 kg)  06/27/22 172 lb 6.4 oz (78.2 kg)   Constitutional: overweight, in NAD Eyes: EOMI, no exophthalmos ENT: no thyromegaly, no cervical lymphadenopathy Cardiovascular: RRR, No MRG Respiratory: CTA B Musculoskeletal: no deformities Skin: + B staisis dermatitis rash. 0.5 cm superficial  ulcer R shin, violaceous small healed ulcer on L shin Neurological: no tremor with outstretched hands  ASSESSMENT: 1. DM2, non-insulin-dependent, uncontrolled, with  complications - CVA - 06/2018 - CKD - PDR - PN - severe hypoglycemia in 2018  Component     Latest Ref Rng 10/13/2021  Hemoglobin A1C     4.0 - 5.6 % 8.7 !   Glucose     65 - 99 mg/dL 161 (H)   Glucose Fasting, POC     70 - 99 mg/dL 096 !   Islet Cell Ab     Neg:<1:1  Negative   C-Peptide  0.80 - 3.85 ng/mL 1.92   ZNT8 Antibodies     <15 U/mL <10   Glutamic Acid Decarb Ab     <5 IU/mL <5     No insulin deficiency or antipancreatic autoimmunity.  2. HL  PLAN:  1. Patient with longstanding, uncontrolled, type 2 diabetes, on oral antidiabetic regimen with SGLT2 inhibitor and also on weekly GLP-1 receptor agonist, with improved HbA1c at last visit, of 6.6%, but recent HbA1c was higher, at 7.0%, last month. -She previously had higher HbA1c of 9.1% after she relaxed her diet, as she is normally on a plant-based diet.  She improved diet afterwards and sugars were much better.  She felt that Ozempic was greatly helping.  We did not change her regimen at last visit.  At that time we could not review her CGM tracings.  Since last visit she obtained a Dexcom CGM. CGM interpretation: -At today's visit, we reviewed her CGM downloads: It appears that 73% of values are in target range (goal >70%), while 27% are higher than 180 (goal <25%), and 0% are lower than 70 (goal <4%).  The calculated average blood sugar is 161.  The projected HbA1c for the next 3 months (GMI) is 7.2%. -Reviewing the CGM trends, sugars appear to be fluctuating within the target range when she is not eating, but they increase after every meal.  Upon questioning, she does have sweets with her meals: Fruit cups, cookies.  We discussed about stopping these, however, I also recommended to increase the dose of Ozempic, since she tolerates well the lower dose.   Will continue [place] for now.  I am trying to avoid mealtime insulin for her. - I suggested to:  Patient Instructions  Please continue: - Farxiga 10 mg before breakfast  Please increase: - Ozempic 1 mg weekly  Please increase: - Crestor 20 mg daily  Please return in 3 months.  - advised to check sugars at different times of the day - 4x a day, rotating check times - advised for yearly eye exams >> she is UTD - return to clinic in 3 months  2. HL -Reviewed latest lipid panel from 05/2023: Fractions at goal with the exception of the LDL above our target of 55 due to cardiovascular disease: Lab Results  Component Value Date   CHOL 164 05/29/2023   HDL 73.70 05/29/2023   LDLCALC 76 05/29/2023   LDLDIRECT 126.7 03/24/2013   TRIG 72.0 05/29/2023   CHOLHDL 2 05/29/2023  -She is on Crestor 10 mg daily without side effects.  At today's visit, I recommended to increase the dose to 20 mg daily to hopefully get to the target LDL.  I called this in for her.  Carlus Pavlov, MD PhD [facility]

## 2023-07-18 ENCOUNTER — Other Ambulatory Visit: Payer: Self-pay | Admitting: Internal Medicine

## 2023-07-30 ENCOUNTER — Telehealth: Payer: Self-pay

## 2023-07-30 ENCOUNTER — Encounter: Payer: Self-pay | Admitting: Internal Medicine

## 2023-07-30 NOTE — Telephone Encounter (Signed)
 Pt needs PA for Dexcom G7

## 2023-07-30 NOTE — Telephone Encounter (Signed)
 Pharmacy Patient Advocate Encounter   Received notification from Pt Calls Messages that prior authorization for Dexcom G7 sensor is required/requested.   Insurance verification completed.   The patient is insured through [place] .   Per test claim: PA required; PA started via CoverMyMeds. KEY [id number] . Waiting for clinical questions to populate.

## 2023-08-16 ENCOUNTER — Telehealth: Payer: Self-pay | Admitting: Internal Medicine

## 2023-08-16 NOTE — Telephone Encounter (Signed)
 Copied from CRM [id number]. Topic: General - Other >> Aug 16, 2023 10:44 AM Florestine Avers wrote: Reason for CRM: Patient said shell stop by the clinic to drop off her handicap plate form.

## 2023-08-19 ENCOUNTER — Telehealth: Payer: Self-pay | Admitting: Internal Medicine

## 2023-08-19 MED ORDER — DEXCOM G7 SENSOR MISC: 3.0000 | 1 refills | Status: DC

## 2023-08-19 NOTE — Telephone Encounter (Signed)
 Requested Prescriptions   Signed Prescriptions Disp Refills   Continuous Glucose Sensor (DEXCOM G7 SENSOR) MISC 9 each 1    Sig: 3 each by Does not apply route every 30 (thirty) days. Apply 1 sensor every 10 days    Authorizing Provider: Carlus Pavlov    Ordering User: Pollie Meyer

## 2023-08-19 NOTE — Telephone Encounter (Signed)
 New message   The patient is asking for a call back regarding Continuous Glucose Sensor (DEXCOM G7 SENSOR) MISC . Patient voiced she is out of her medication.   [employer] company   [facility] [id number] [place], [place] - [address] Phone: [phone]  Fax: [phone]

## 2023-08-22 NOTE — Telephone Encounter (Signed)
 Form completed and awaiting signature from Dr. Lawerance Bach.

## 2023-08-23 NOTE — Telephone Encounter (Signed)
 Spoke with patient and form left up front for pick up.

## 2023-08-30 ENCOUNTER — Telehealth: Payer: Self-pay

## 2023-08-30 NOTE — Telephone Encounter (Signed)
 Pt needs PA for Dexcom G7

## 2023-09-02 ENCOUNTER — Other Ambulatory Visit (HOSPITAL_COMMUNITY): Payer: Self-pay

## 2023-09-02 NOTE — Telephone Encounter (Signed)
 Pharmacy Patient Advocate Encounter  Received notification from [employer] that Prior Authorization for Dexcom G7 sensor has been CANCELLED by the processor we sent it to: Resubmitted through CoverMyMeds to [facility]  PA was cancelled again stating: There is an existing case that has the same patient, prescriber, and drug. This case must be finalized before proceeding with similar requests.   Called insurance and they say that Dexcom was DENIED because pt is not on insulin.

## 2023-09-12 ENCOUNTER — Encounter: Payer: Self-pay | Admitting: Internal Medicine

## 2023-09-12 LAB — HM DIABETES EYE EXAM

## 2023-09-23 ENCOUNTER — Other Ambulatory Visit (HOSPITAL_COMMUNITY): Payer: Self-pay

## 2023-09-27 ENCOUNTER — Encounter: Payer: Self-pay | Admitting: Internal Medicine

## 2023-09-27 ENCOUNTER — Telehealth: Payer: Self-pay

## 2023-09-27 ENCOUNTER — Ambulatory Visit: Payer: Medicare PPO | Admitting: Internal Medicine

## 2023-09-27 VITALS — BP 130/70 | HR 85 | Ht 66.0 in | Wt 152.6 lb

## 2023-09-27 DIAGNOSIS — Z7984 Long term (current) use of oral hypoglycemic drugs: Secondary | ICD-10-CM | POA: Diagnosis not present

## 2023-09-27 DIAGNOSIS — E1159 Type 2 diabetes mellitus with other circulatory complications: Secondary | ICD-10-CM | POA: Diagnosis not present

## 2023-09-27 DIAGNOSIS — Z7985 Long-term (current) use of injectable non-insulin antidiabetic drugs: Secondary | ICD-10-CM

## 2023-09-27 DIAGNOSIS — E1165 Type 2 diabetes mellitus with hyperglycemia: Secondary | ICD-10-CM | POA: Diagnosis not present

## 2023-09-27 DIAGNOSIS — E7849 Other hyperlipidemia: Secondary | ICD-10-CM | POA: Diagnosis not present

## 2023-09-27 LAB — POCT GLYCOSYLATED HEMOGLOBIN (HGB A1C): Hemoglobin A1C: 6.2 % — AB (ref 4.0–5.6)

## 2023-09-27 MED ORDER — FREESTYLE LIBRE 3 PLUS SENSOR MISC: 1.0000 | 11 refills | Status: DC

## 2023-09-27 NOTE — Progress Notes (Signed)
 Patient ID: Rebekah Kennedy, female   DOB: 1956/04/06, 68 y.o.   MRN: [id number]  HPI: Rebekah Kennedy is a 68 y.o.-year-old female, returning for follow-up for DM2, dx in 2004, non-insulin -dependent (on insulin  until 2019), uncontrolled, with complications (CVA, PDR, CKD, PN, severe hypoglycemia in 2018). Pt. previously saw Dr. Washington Hacker. Last OV with me 4 months ago.  Interim history: She is back on a whole food plant based diet  - based on Dr. Dell Fennel book. No nausea, chest pain, increased urination. She has been having floaters for the last few days, but had a larger floater yesterday >> resolved.  Recent eye exam showed stable diabetic retinopathy reportedly.  Reviewed HbA1c: Lab Results  Component Value Date   HGBA1C 7.0 (H) 05/29/2023   HGBA1C 6.6 (A) 03/29/2023   HGBA1C 9.1 (A) 11/13/2022   HGBA1C 7.4 (A) 06/27/2022   HGBA1C 7.6 (A) 02/20/2022   HGBA1C 8.7 (A) 10/13/2021   HGBA1C 8.8 (H) 08/10/2021   HGBA1C 7.4 (H) 02/10/2021   HGBA1C 8.2 (H) 11/09/2020   HGBA1C 8.6 (H) 08/05/2020   Pt is on a regimen of: - Januvia  >> Rybelsus  14 mg before breakfast >> Ozempic  0.5 >> 1 mg weekly  - Farxiga  10 >> 5 >> 10 mg before breakfast Glyburide  every day - started by Dr. Donnette Gal >> stopped 06/2022 due to CBG 50. Metformin caused diarrhea. She was previously on insulin  between 2013 and 2019 before her gastric bypass.  She checks her blood sugars with the Dexcom CGM:  Previously:   Previously:  Lowest sugar was 56 >> 56 >> 88 >> 70s; she has hypoglycemia awareness at 70.  Highest sugar was 400 >> ... 200s >> 390s >> 401.  Glucometer:One Touch Verio  - no CKD, last BUN/creatinine:  Lab Results  Component Value Date   BUN 13 05/29/2023   BUN 26 (H) 11/21/2022   CREATININE 0.65 05/29/2023   CREATININE 0.85 11/21/2022   Lab Results  Component Value Date   MICRALBCREAT 109.7 (H) 05/29/2023   MICRALBCREAT 52.1 (H) 08/10/2022   MICRALBCREAT 30.1 (H) 08/10/2021   MICRALBCREAT  14.4 09/16/2013   MICRALBCREAT 12.4 07/14/2012   MICRALBCREAT 272.3 (H) 09/15/2009   MICRALBCREAT 187.2 (H) 06/17/2007   MICRALBCREAT 34.7 (H) 10/11/2006  On losartan  100 mg daily.  -+ HL; last set of lipids: Lab Results  Component Value Date   CHOL 164 05/29/2023   HDL 73.70 05/29/2023   LDLCALC 76 05/29/2023   LDLDIRECT 126.7 03/24/2013   TRIG 72.0 05/29/2023   CHOLHDL 2 05/29/2023  On Crestor  10 mg daily.  - last eye exam was in  09/12/2023. + DR. Finished IO injections for macular edema. No bleeding.  In the past few days - floaters >> cleared up now.  - no numbness and tingling in her feet.  Last foot exam 11/13/2022.  She is seeing podiatry (Dr. Terance Felt), but I do not have access to the annual foot exam records.  On ASA 81.  She had gastric bypass in 2019.  She lost 80 pounds afterwards. She also has a history of HTN, osteopenia, depression, asthma, obstructive sleep apnea. She is a Runner, broadcasting/film/video.  ROS: + see HPI  Past Medical History:  Diagnosis Date   Ambulates with cane 11/29/2020   outside of home   ASTHMA NOS W/ACUTE EXACERBATION 07/10/2010   Cervical dysplasia    DEPRESSION 03/16/2007   DIABETES MELLITUS, TYPE II 12/01/2006   dx 2004   Dysmenorrhea    Fibroid    History  of blood product transfusion    took own blood that was banked in 1990's   HYPERLIPIDEMIA 12/01/2006   HYPERTENSION 07/07/2006   Macular edema    Neuropathy    fingertips and tose some numbness, right foot numb at times   Obesity    Osteopenia 05/2017   T score -1.5 FRAX 2.6% / 0.1%   Pneumonia    1990's and 2000 none since   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 issuse resolved   Retinal edema    gets steriod inj in the eyes     Sleep apnea    not needed since 100 pound weight loss 2019-2020   Splenomegaly    in college   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   left side pt states, left wide weak per pt   Stroke [facility]) 2012   right pontine cva with left  hemiparesis   Past Surgical History:  Procedure Laterality Date   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   APPENDECTOMY     yrs ago per pt 0n 11-29-2020   BIOPSY THYROID   05/02/2011   Nonneoplastic goiter   BREAST BIOPSY     BREAST LUMPECTOMY WITH RADIOACTIVE SEED LOCALIZATION Left 07/25/2017   Procedure: LEFT BREAST LUMPECTOMY WITH RADIOACTIVE SEED LOCALIZATION;  Surgeon: Oza Blumenthal, MD;  Location: [facility] OR;  Service: General;  Laterality: Left;   BREAST SURGERY     Reduction   cataracts Bilateral 2012   COLONOSCOPY  2021   COLPOSCOPY     DILATATION & CURETTAGE/HYSTEROSCOPY WITH MYOSURE N/A 12/02/2020   Procedure: DILATATION & CURETTAGE/HYSTEROSCOPY WITH MYOSURE;  Surgeon: Lavoie, Marie-Lyne, MD;  Location: [facility];  Service: Gynecology;  Laterality: N/A;   DILATION AND CURETTAGE OF UTERUS  1975   DUB   EYE SURGERY     Laser for vision at times both eyes done   GASTRIC ROUX-EN-Y N/A 08/12/2017   Procedure: LAPAROSCOPIC ROUX-EN-Y GASTRIC BYPASS WITH HIATAL HERNIA REPAIR AND UPPER ENDOSCOPY;  Surgeon: [name], Alphonso Aschoff, MD;  Location: [facility] ORS;  Service: General;  Laterality: N/A;   GYNECOLOGIC CRYOSURGERY     MYOMECTOMY     OVARIAN CYST REMOVAL     PELVIC LAPAROSCOPY     DL lysis of adhesions 1610 and 1988   Social History   Socioeconomic History   Marital status: Single    Spouse name: Not on file   Number of children: 2   Years of education: 16   Highest education level: Bachelor's degree (e.g., BA, AB, BS)  Occupational History   Occupation: TEACHER    Employer: [employer]  Tobacco Use   Smoking status: Never   Smokeless tobacco: Never  Vaping Use   Vaping status: Never Used  Substance and Sexual Activity   Alcohol  use: Not Currently    Alcohol /week: 0.0 standard drinks of alcohol    Drug use: Never   Sexual activity: Not Currently    Birth control/protection: Post-menopausal    Comment: older than 17, less than 5  Other Topics  Concern   Not on file  Social History Narrative   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   Living with daughter      Right handed      4 steps inside of the home      Social Drivers of Health   Financial Resource Strain: Low Risk  (02/18/2023)  Overall Financial Resource Strain (CARDIA)    Difficulty of Paying Living Expenses: Not hard at all  Food Insecurity: No Food Insecurity (02/18/2023)   Hunger Vital Sign    Worried About Running Out of Food in the Last Year: Never true    Ran Out of Food in the Last Year: Never true  Transportation Needs: No Transportation Needs (02/18/2023)   PRAPARE - Administrator, Civil Service (Medical): No    Lack of Transportation (Non-Medical): No  Physical Activity: Insufficiently Active (02/18/2023)   Exercise Vital Sign    Days of Exercise per Week: 3 days    Minutes of Exercise per Session: 30 min  Stress: No Stress Concern Present (02/18/2023)   [employer] of Occupational Health - Occupational Stress Questionnaire    Feeling of Stress : Not at all  Social Connections: Moderately Isolated (02/18/2023)   Social Connection and Isolation Panel [NHANES]    Frequency of Communication with Friends and Family: More than three times a week    Frequency of Social Gatherings with Friends and Family: More than three times a week    Attends Religious Services: More than 4 times per year    Active Member of [employer] or Organizations: No    Attends Banker Meetings: Never    Marital Status: Never married  Intimate Partner Violence: Not At Risk (02/18/2023)   Humiliation, Afraid, Rape, and Kick questionnaire    Fear of Current or Ex-Partner: No    Emotionally Abused: No    Physically Abused: No    Sexually Abused: No   Current Outpatient Medications on File Prior to Visit  Medication Sig Dispense Refill   albuterol  (PROVENTIL  HFA;VENTOLIN  HFA) 108 (90 Base) MCG/ACT inhaler  Inhale 2 puffs into the lungs every 4 (four) hours as needed for wheezing or shortness of breath. 18 g 2   amLODipine  (NORVASC ) 5 MG tablet TAKE 1 TABLET BY MOUTH EVERY DAY 90 tablet 1   ammonium lactate  (AMLACTIN) 12 % lotion Apply 1 application topically as needed for dry skin. 400 g 5   aspirin  EC 81 MG EC tablet Take 1 tablet (81 mg total) by mouth daily. 30 tablet 0   Biotin  5000 MCG TABS Take 10,000 mcg by mouth daily.      calcium -vitamin D  (OSCAL WITH D) 500-200 MG-UNIT tablet Take 1 tablet by mouth daily.     Cholecalciferol (VITAMIN D3 PO) Take 2,000 Units by mouth daily.     clopidogrel  (PLAVIX ) 75 MG tablet TAKE 1 TABLET BY MOUTH EVERY DAY 90 tablet 1   Continuous Glucose Sensor (DEXCOM G7 SENSOR) MISC 3 each by Does not apply route every 30 (thirty) days. Apply 1 sensor every 10 days 9 each 1   FARXIGA  10 MG TABS tablet TAKE 1 TABLET BY MOUTH DAILY BEFORE BREAKFAST. 90 tablet 3   furosemide  (LASIX ) 20 MG tablet TAKE 1 TABLET BY MOUTH EVERY DAY 90 tablet 1   glucose blood (FREESTYLE PRECISION NEO TEST) test strip Use as instructed 100 each 12   hydrALAZINE  (APRESOLINE ) 25 MG tablet TAKE 1 TABLET BY MOUTH THREE TIMES A DAY 270 tablet 1   losartan  (COZAAR ) 100 MG tablet TAKE 1 TABLET BY MOUTH EVERY DAY 90 tablet 1   metoprolol  succinate (TOPROL -XL) 100 MG 24 hr tablet TAKE 1 TABLET BY MOUTH DAILY WITH OR IMMEDIATELY FOLLOWING A MEAL 90 tablet 1   montelukast  (SINGULAIR ) 10 MG tablet TAKE 1 TABLET BY MOUTH EVERY DAY 90 tablet 1  mupirocin  ointment (BACTROBAN ) 2 % APPLY TO AFFECTED AREA TWICE A DAY 22 g 0   nystatin -triamcinolone  ointment (MYCOLOG) Apply topically as needed. (Patient taking differently: Apply 1 Application topically daily as needed (itching).) 30 g 4   OVER THE COUNTER MEDICATION Take 1 tablet by mouth daily. Multivitamin centrum 1 daily     rosuvastatin  (CRESTOR ) 20 MG tablet Take 1 tablet (20 mg total) by mouth daily. 90 tablet 3   Semaglutide , 1 MG/DOSE, (OZEMPIC , 1  MG/DOSE,) 4 MG/3ML SOPN Inject 1 mg into the skin once a week. 9 mL 3   tacrolimus  (PROTOPIC ) 0.1 % ointment Apply topically 2 (two) times daily. (Patient taking differently: Apply 1 Application topically daily as needed (blisters).) 30 g 2   No current facility-administered medications on file prior to visit.   Allergies  Allergen Reactions   Quinapril Cough   Metformin And Related     Loose stool   Family History  Problem Relation Age of Onset   Cancer Maternal Grandmother        Colon Cancer   Asthma Maternal Grandmother    Colon cancer Maternal Grandmother    Diabetes Father    Heart disease Father    Hypertension Father    Hyperlipidemia Father    Stroke Father        passed at age 110 from stroke   Hypertension Mother    Heart disease Mother    Ovarian cancer Mother    Cancer Mother        Lung cancer   Asthma Mother    COPD Mother    Hyperlipidemia Mother    Diabetes Brother    Kidney disease Brother    Hypertension Brother    Murrell Arrant' disease Sister    Diabetes Sister    Breast cancer Sister 21   Graves' disease Paternal Grandmother    Hypertension Paternal Grandmother    Heart disease Paternal Grandmother    Alzheimer's disease Paternal Grandmother    Cancer Maternal Grandfather    Heart failure Brother    Prostate cancer Brother    Diabetes Brother    Asthma Brother    Hypertension Brother    Heart failure Brother    Prostate cancer Brother    Stroke Brother    Hypertension Brother    PE: BP 130/70   Pulse 85   Ht 5\' 6"  (1.676 m)   Wt 152 lb 9.6 oz (69.2 kg)   SpO2 97%   BMI 24.63 kg/m  Wt Readings from Last 10 Encounters:  09/27/23 152 lb 9.6 oz (69.2 kg)  07/04/23 157 lb 12.8 oz (71.6 kg)  06/04/23 162 lb (73.5 kg)  05/29/23 158 lb (71.7 kg)  03/29/23 162 lb 3.2 oz (73.6 kg)  02/18/23 167 lb (75.8 kg)  11/21/22 167 lb 12.8 oz (76.1 kg)  11/14/22 173 lb (78.5 kg)  11/13/22 175 lb 6.4 oz (79.6 kg)  07/18/22 166 lb (75.3 kg)    Constitutional: overweight, in NAD Eyes: EOMI, no exophthalmos ENT: no thyromegaly, no cervical lymphadenopathy Cardiovascular: RRR, No MRG Respiratory: CTA B Musculoskeletal: no deformities Skin: + B staisis dermatitis rash. +violaceous small healed ulcer on L shin Neurological: no tremor with outstretched hands Diabetic Foot Exam - Simple   Simple Foot Form Diabetic Foot exam was performed with the following findings: Yes 09/27/2023 10:04 AM  Visual Inspection Sensation Testing Intact to touch and monofilament testing bilaterally: Yes Pulse Check See comments: Yes Comments + B pitting edema onychodystrophy  ASSESSMENT: 1. DM2, non-insulin -dependent, uncontrolled, with  complications - CVA - 06/2018 - CKD - PDR - PN - severe hypoglycemia in 2018  Component     Latest Ref Rng 10/13/2021  Hemoglobin A1C     4.0 - 5.6 % 8.7 !   Glucose     65 - 99 mg/dL 409 (H)   Glucose Fasting, POC     70 - 99 mg/dL 811 !   Islet Cell Ab     Neg:<1:1  Negative   C-Peptide     0.80 - 3.85 ng/mL 1.92   ZNT8 Antibodies     <15 U/mL <10   Glutamic Acid Decarb Ab     <5 IU/mL <5     No insulin  deficiency or antipancreatic autoimmunity.  2. HL  PLAN:  1. Patient with longstanding, uncontrolled, type 2 diabetes, on oral antidiabetic regimen with SGLT2 inhibitor and also weekly GLP-1 receptor agonist, with proved control last year, to an HbA1c of 6.6% but increased at last visit to 7.0%.  At that time, sugars were fluctuating within the target range when she was not eating but they were increasing after every meal.  Upon questioning, she did have sweets with her meals: Fruit cups, cookies.  We discussed about trying to stop this but I also recommended to increase the dose of Ozempic .  We continued Farxiga . CGM interpretation: -At today's visit, we reviewed her CGM downloads from 1 month ago (she is not checking sugars in the last month): It appears that 74% of values are in target  range (goal >70%), while 26% are higher than 180 (goal <25%), and 0% are lower than 70 (goal <4%).  The calculated average blood sugar is 159.  The projected HbA1c for the next 3 months (GMI) is 7.1%. -Reviewing the CGM trends, sugars are mostly fluctuating within the target range but she has hyperglycemic spikes after meals particularly after dinner.  Sugars may remain elevated during the night.  It is possible that her sugars improved in the last month, as her HbA1c is much better at today's visit (see below).  Pending more data, I did not suggest to change her regimen.  Her insurance is not covering the Dexcom anymore.  We gave her a freestyle libre 3 sensor and we also discussed about possibly buying it outside insurance, and she will try this. - I suggested to:  Patient Instructions  Please continue: - Farxiga  10 mg before breakfast - Ozempic  1 mg weekly  Please continue: - Crestor  20 mg daily  Please return in 2-3 months.  - we checked her HbA1c: 6.2% (lower) - advised to check sugars at different times of the day - 4x a day, rotating check times - advised for yearly eye exams >> she is UTD - return to clinic in 3-4 months  2. HL -Reviewed latest lipid panel from 05/2023: Fractions at goal, with the exception of an LDL higher than our target of less than 55 due to cardiovascular disease: Lab Results  Component Value Date   CHOL 164 05/29/2023   HDL 73.70 05/29/2023   LDLCALC 76 05/29/2023   LDLDIRECT 126.7 03/24/2013   TRIG 72.0 05/29/2023   CHOLHDL 2 05/29/2023  - At last visit I recommended to increase Crestor  from 10 mg to 20 mg daily.   Plan to repeat another lipid panel at next visit.  Emilie Harden, MD PhD [facility] Endocrinology

## 2023-09-27 NOTE — Telephone Encounter (Signed)
 Sample  Device/Supplies: Libre 3 Plus Quantity:1 ZOX:[id number] EXP:02/18/24

## 2023-09-27 NOTE — Patient Instructions (Addendum)
 Please continue: - Farxiga  10 mg before breakfast - Ozempic  1 mg weekly  Please continue: - Crestor  20 mg daily  Please return in 2-3 months.

## 2023-09-29 ENCOUNTER — Other Ambulatory Visit: Payer: Self-pay | Admitting: Internal Medicine

## 2023-11-05 LAB — HM MAMMOGRAPHY

## 2023-11-06 ENCOUNTER — Encounter: Payer: Self-pay | Admitting: Internal Medicine

## 2023-11-07 ENCOUNTER — Encounter: Payer: Self-pay | Admitting: Plastic Surgery

## 2023-11-07 ENCOUNTER — Telehealth: Payer: Self-pay

## 2023-11-07 ENCOUNTER — Ambulatory Visit: Admitting: Plastic Surgery

## 2023-11-07 VITALS — BP 194/80 | HR 64 | Ht 66.0 in | Wt 151.0 lb

## 2023-11-07 DIAGNOSIS — E65 Localized adiposity: Secondary | ICD-10-CM

## 2023-11-07 DIAGNOSIS — Z9884 Bariatric surgery status: Secondary | ICD-10-CM

## 2023-11-07 DIAGNOSIS — M793 Panniculitis, unspecified: Secondary | ICD-10-CM | POA: Diagnosis not present

## 2023-11-07 NOTE — Telephone Encounter (Signed)
 Faxed patient's PCP (handles patient's antiplatelet medication) and endocrinologist (DM) with confirmed receipt.  PCP: Dr. Oma Bias Phone: [phone] Fax: [phone]  (Requesting to hold Plavix  3 days before procedure and 5 days after procedure)  Endocrinologist: Dr. Emilie Harden Phone: [phone] Fax: [phone]  (Hold biologics due to surgical healing complications)

## 2023-11-07 NOTE — Progress Notes (Signed)
 Referring Provider Colene Dauphin, MD [address] [place],  [place] [address]   CC:  Chief Complaint  Patient presents with   Advice Only   Skin Problem      Rebekah Kennedy is an 68 y.o. female.  HPI: Rebekah Kennedy is a 68 year old female who underwent gastric bypass surgery in 2019.  She has lost in excess of 100 pounds and is currently stable at her weight.  She notes that she now has excess skin and fat on the anterior portion of her abdominal wall which hangs down and often becomes infected and has rashes.  Not only is is not comfortable but she is concerned that the persistent rashes may become a problem with her diabetes.  She is interested in having the skin and fat removed.  She does have significant medical history including peripheral vascular disease.  She has a history of TIAs and stroke and is currently on Plavix .  Her diabetes has been reasonably poorly controlled until recently when her last hemoglobin A1c was 6.2.  Allergies  Allergen Reactions   Quinapril Cough   Metformin And Related     Loose stool    Outpatient Encounter Medications as of 11/07/2023  Medication Sig   albuterol  (PROVENTIL  HFA;VENTOLIN  HFA) 108 (90 Base) MCG/ACT inhaler Inhale 2 puffs into the lungs every 4 (four) hours as needed for wheezing or shortness of breath.   amLODipine  (NORVASC ) 5 MG tablet TAKE 1 TABLET BY MOUTH EVERY DAY   ammonium lactate  (AMLACTIN) 12 % lotion Apply 1 application topically as needed for dry skin.   aspirin  EC 81 MG EC tablet Take 1 tablet (81 mg total) by mouth daily.   Biotin  5000 MCG TABS Take 10,000 mcg by mouth daily.    calcium -vitamin D  (OSCAL WITH D) 500-200 MG-UNIT tablet Take 1 tablet by mouth daily.   Cholecalciferol (VITAMIN D3 PO) Take 2,000 Units by mouth daily.   clopidogrel  (PLAVIX ) 75 MG tablet TAKE 1 TABLET BY MOUTH EVERY DAY   Continuous Glucose Sensor (DEXCOM G7 SENSOR) MISC 3 each by Does not apply route every 30 (thirty) days. Apply 1 sensor  every 10 days   Continuous Glucose Sensor (FREESTYLE LIBRE 3 PLUS SENSOR) MISC 1 each by Does not apply route every 14 (fourteen) days.   FARXIGA  10 MG TABS tablet TAKE 1 TABLET BY MOUTH DAILY BEFORE BREAKFAST.   furosemide  (LASIX ) 20 MG tablet TAKE 1 TABLET BY MOUTH EVERY DAY   glucose blood (FREESTYLE PRECISION NEO TEST) test strip Use as instructed   hydrALAZINE  (APRESOLINE ) 25 MG tablet TAKE 1 TABLET BY MOUTH THREE TIMES A DAY   losartan  (COZAAR ) 100 MG tablet TAKE 1 TABLET BY MOUTH EVERY DAY   metoprolol  succinate (TOPROL -XL) 100 MG 24 hr tablet TAKE 1 TABLET BY MOUTH DAILY WITH OR IMMEDIATELY FOLLOWING A MEAL   montelukast  (SINGULAIR ) 10 MG tablet TAKE 1 TABLET BY MOUTH EVERY DAY   mupirocin  ointment (BACTROBAN ) 2 % APPLY TO AFFECTED AREA TWICE A DAY   nystatin -triamcinolone  ointment (MYCOLOG) Apply topically as needed. (Patient taking differently: Apply 1 Application topically daily as needed (itching).)   OVER THE COUNTER MEDICATION Take 1 tablet by mouth daily. Multivitamin centrum 1 daily   rosuvastatin  (CRESTOR ) 20 MG tablet Take 1 tablet (20 mg total) by mouth daily.   Semaglutide , 1 MG/DOSE, (OZEMPIC , 1 MG/DOSE,) 4 MG/3ML SOPN Inject 1 mg into the skin once a week.   tacrolimus  (PROTOPIC ) 0.1 % ointment Apply topically 2 (two) times  daily. (Patient taking differently: Apply 1 Application topically daily as needed (blisters).)   No facility-administered encounter medications on file as of 11/07/2023.     Past Medical History:  Diagnosis Date   Ambulates with cane 11/29/2020   outside of home   ASTHMA NOS W/ACUTE EXACERBATION 07/10/2010   Cervical dysplasia    DEPRESSION 03/16/2007   DIABETES MELLITUS, TYPE II 12/01/2006   dx 2004   Dysmenorrhea    Fibroid    History of blood product transfusion    took own blood that was banked in 1990's   HYPERLIPIDEMIA 12/01/2006   HYPERTENSION 07/07/2006   Macular edema    Neuropathy    fingertips and tose some numbness, right foot  numb at times   Obesity    Osteopenia 05/2017   T score -1.5 FRAX 2.6% / 0.1%   Pneumonia    1990's and 2000 none since   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 issuse resolved   Retinal edema    gets steriod inj in the eyes     Sleep apnea    not needed since 100 pound weight loss 2019-2020   Splenomegaly    in college   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   left side pt states, left wide weak per pt   Stroke [facility]) 2012   right pontine cva with left hemiparesis    Past Surgical History:  Procedure Laterality Date   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;  Service: General;  Laterality: Left;   BREAST SURGERY     Reduction   cataracts Bilateral 2012   COLONOSCOPY  2021   COLPOSCOPY     DILATATION & CURETTAGE/HYSTEROSCOPY WITH MYOSURE N/A 12/02/2020   Procedure: DILATATION & CURETTAGE/HYSTEROSCOPY WITH MYOSURE;  Surgeon: Lavoie, Marie-Lyne, MD;  Location: [facility];  Service: Gynecology;  Laterality: N/A;   DILATION AND CURETTAGE OF UTERUS  1975   DUB   EYE SURGERY     Laser for vision at times both eyes done   GASTRIC ROUX-EN-Y N/A 08/12/2017   Procedure: LAPAROSCOPIC ROUX-EN-Y GASTRIC BYPASS WITH HIATAL HERNIA REPAIR AND UPPER ENDOSCOPY;  Surgeon: [name], Alphonso Aschoff, MD;  Location: [facility] ORS;  Service: General;  Laterality: N/A;   GYNECOLOGIC CRYOSURGERY     MYOMECTOMY     OVARIAN CYST REMOVAL     PELVIC LAPAROSCOPY     DL lysis of adhesions 1610 and 1988    Family History  Problem Relation Age of Onset   Cancer Maternal Grandmother        Colon Cancer   Asthma Maternal Grandmother    Colon cancer Maternal Grandmother     Diabetes Father    Heart disease Father    Hypertension Father    Hyperlipidemia Father    Stroke Father        passed at age 40 from stroke   Hypertension Mother    Heart disease Mother    Ovarian cancer Mother    Cancer Mother        Lung cancer   Asthma Mother    COPD Mother    Hyperlipidemia  Mother    Diabetes Brother    Kidney disease Brother    Hypertension Brother    Murrell Arrant' disease Sister    Diabetes Sister    Breast cancer Sister 52   Graves' disease Paternal Grandmother    Hypertension Paternal Grandmother    Heart disease Paternal Grandmother    Alzheimer's disease Paternal Grandmother    Cancer Maternal Grandfather    Heart failure Brother    Prostate cancer Brother    Diabetes Brother    Asthma Brother    Hypertension Brother    Heart failure Brother    Prostate cancer Brother    Stroke Brother    Hypertension Brother     Social History   Social History Narrative   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  Frequent rashes which are poorly treated with nystatin  powders.  Physical Exam    11/07/2023    8:09 AM 09/27/2023    9:51 AM 07/04/2023   10:14 AM  Vitals with BMI  Height 5' 6 5' 6 5' 6  Weight 151 lbs 152 lbs 10 oz 157 lbs 13 oz  BMI 24.38 24.64 25.48  Systolic 194 130 784  Diastolic 80 70 70  Pulse 64 85 82    General:  No acute distress,  Alert and oriented, Non-Toxic, Normal speech and affect Abdomen: Patient has significant amount of skin and fat on the anterior abdominal wall.  Despite the fact that the midline is tethered from a previous incision the pannus does have a bulge below the level of the symphysis pubis.   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  She is interested in panniculectomy.  We discussed the procedure at length including the location of the incisions and the unpredictable nature of scarring and wound healing.  We discussed the risks of bleeding, infection, seroma formation.  She understands I will use drains postoperatively and these will be in place for 1 to 4 weeks.  She understands she will need to wear compressive garment for 6 weeks.  We discussed postoperative limitations including no heavy lifting greater than 20 pounds, no vigorous activity, no submerging incisions in water for 6 weeks.  Due to her extensive medical history of peripheral vascular disease and strokes she will need cardiac clearance and she will need clearance from her primary care provider.  Patient understands that she cannot be on Plavix  for this procedure and will need to stop the Plavix  for minimum of 3 days prior to the procedure and not resume for 1 week postoperatively.  She also understands that if her next hemoglobin A1c which is likely to be done prior to her surgery is elevated that we will delay her surgery until it is under better control.  She will need assistance postoperatively and states that she can get her daughter to stay with her for a few days.  All questions were answered to her  satisfaction.  Photographs were obtained today with her consent.  Will schedule her for a panniculectomy at her request.  Teretha Ferguson 11/07/2023, 8:36 AM

## 2023-11-09 LAB — AMB RESULTS CONSOLE CBG: Glucose: 161

## 2023-11-12 NOTE — Progress Notes (Signed)
 No sdoh needs

## 2023-11-27 ENCOUNTER — Ambulatory Visit: Payer: Self-pay | Admitting: Internal Medicine

## 2023-12-09 ENCOUNTER — Ambulatory Visit: Admitting: Internal Medicine

## 2023-12-09 ENCOUNTER — Encounter: Payer: Self-pay | Admitting: Internal Medicine

## 2023-12-09 ENCOUNTER — Other Ambulatory Visit: Payer: Self-pay

## 2023-12-09 VITALS — BP 138/80 | HR 62 | Ht 66.0 in | Wt 152.2 lb

## 2023-12-09 DIAGNOSIS — E7849 Other hyperlipidemia: Secondary | ICD-10-CM

## 2023-12-09 DIAGNOSIS — E1165 Type 2 diabetes mellitus with hyperglycemia: Secondary | ICD-10-CM

## 2023-12-09 DIAGNOSIS — Z7985 Long-term (current) use of injectable non-insulin antidiabetic drugs: Secondary | ICD-10-CM

## 2023-12-09 DIAGNOSIS — E1159 Type 2 diabetes mellitus with other circulatory complications: Secondary | ICD-10-CM | POA: Diagnosis not present

## 2023-12-09 DIAGNOSIS — Z7984 Long term (current) use of oral hypoglycemic drugs: Secondary | ICD-10-CM | POA: Diagnosis not present

## 2023-12-09 LAB — POCT GLYCOSYLATED HEMOGLOBIN (HGB A1C): Hemoglobin A1C: 6.1 % — AB (ref 4.0–5.6)

## 2023-12-09 MED ORDER — DAPAGLIFLOZIN PROPANEDIOL 10 MG PO TABS: 10.0000 mg | ORAL_TABLET | Freq: Every day | ORAL | 3 refills | Status: AC

## 2023-12-09 MED ORDER — FREESTYLE LIBRE 3 READER DEVI: 0 refills | Status: AC

## 2023-12-09 NOTE — Addendum Note (Signed)
 Addended by: CLEOTILDE ROLIN RAMAN on: 12/09/2023 01:16 PM   Modules accepted: Orders

## 2023-12-09 NOTE — Patient Instructions (Addendum)
 Please continue: - Farxiga  10 mg before breakfast - Ozempic  1 mg weekly  Please continue: - Crestor  20 mg daily  Please return in 4-6 months.

## 2023-12-09 NOTE — Progress Notes (Signed)
 Patient ID: Rebekah Kennedy, female   DOB: 02-Jun-1955, 68 y.o.   MRN: [id number]  HPI: MALEAH Kennedy is a 68 y.o.-year-old female, returning for follow-up for DM2, dx in 2004, non-insulin -dependent (on insulin  until 2019), uncontrolled, with complications (CVA, PDR, CKD, PN, severe hypoglycemia in 2018). Pt. previously saw Dr. Kassie. Last OV with me 2.5 months ago.  Interim history: She is back on a whole food plant based diet  - based on Dr. Tennie book. No nausea, chest pain, but has increased urination - on Lasix . She has floaters -thinking about scheduling another appointment with ophthalmology.  Reviewed HbA1c: Lab Results  Component Value Date   HGBA1C 6.2 (A) 09/27/2023   HGBA1C 7.0 (H) 05/29/2023   HGBA1C 6.6 (A) 03/29/2023   HGBA1C 9.1 (A) 11/13/2022   HGBA1C 7.4 (A) 06/27/2022   HGBA1C 7.6 (A) 02/20/2022   HGBA1C 8.7 (A) 10/13/2021   HGBA1C 8.8 (H) 08/10/2021   HGBA1C 7.4 (H) 02/10/2021   HGBA1C 8.2 (H) 11/09/2020   Pt is on a regimen of: - Januvia  >> Rybelsus  14 mg before breakfast >> Ozempic  0.5 >> 1 mg weekly  - Farxiga  10 >> 5 >> 10 mg before breakfast Glyburide  every day - started by Dr. Geofm >> stopped 06/2022 due to CBG 50. Metformin caused diarrhea. She was previously on insulin  between 2013 and 2019 before her gastric bypass.  She checks her blood sugars with the CGM - not compatible with her phone and does not have a receiver -however, she does not remember the values very well: - am: <130 - 2h after b'fast: ? - lunch: ? - 2h after lunch: <200 - dinner: ? - 2h after dinner: usually <200 - bedtime: ?  Prev.:  Previously:   Previously:  Lowest sugar was 56 >> 88 >> 70s; she has hypoglycemia awareness at 70.  Highest sugar was 390s >> 401.  Glucometer:One Touch Verio  - no CKD, last BUN/creatinine:  Lab Results  Component Value Date   BUN 13 05/29/2023   BUN 26 (H) 11/21/2022   CREATININE 0.65 05/29/2023   CREATININE 0.85 11/21/2022    05/29/2023 -SCr elevated as documented by PCP, but I somehow do not have access to tthese data. Lab Results  Component Value Date   MICRALBCREAT 272.3 (H) 09/15/2009   MICRALBCREAT 187.2 (H) 06/17/2007   MICRALBCREAT 34.7 (H) 10/11/2006  On losartan  100 mg daily.  -+ HL; last set of lipids: Lab Results  Component Value Date   CHOL 164 05/29/2023   HDL 73.70 05/29/2023   LDLCALC 76 05/29/2023   LDLDIRECT 126.7 03/24/2013   TRIG 72.0 05/29/2023   CHOLHDL 2 05/29/2023  On Crestor  20 mg daily.  - last eye exam was in  09/12/2023. + PDR. Finished IO injections for macular edema. No bleeding.  In the past few days - floaters >> cleared up now.  - no numbness and tingling in her feet.  Last foot exam 11/11/2023 by Dr. Zan.  We also had a foot exam in the office at last visit 09/27/2023.   On ASA 81.  She had gastric bypass in 2019.  She lost 80 pounds afterwards. She also has a history of HTN, osteopenia, depression, asthma, obstructive sleep apnea. She is a Runner, broadcasting/film/video.  ROS: + see HPI  Past Medical History:  Diagnosis Date   Ambulates with cane 11/29/2020   outside of home   ASTHMA NOS W/ACUTE EXACERBATION 07/10/2010   Cervical dysplasia    DEPRESSION 03/16/2007   DIABETES  MELLITUS, TYPE II 12/01/2006   dx 2004   Dysmenorrhea    Fibroid    History of blood product transfusion    took own blood that was banked in 1990's   HYPERLIPIDEMIA 12/01/2006   HYPERTENSION 07/07/2006   Macular edema    Neuropathy    fingertips and tose some numbness, right foot numb at times   Obesity    Osteopenia 05/2017   T score -1.5 FRAX 2.6% / 0.1%   Pneumonia    1990's and 2000 none since   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   APPENDECTOMY     yrs ago per pt 0n 11-29-2020   BIOPSY THYROID   05/02/2011   Nonneoplastic goiter   BREAST BIOPSY     BREAST LUMPECTOMY WITH RADIOACTIVE SEED LOCALIZATION Left 07/25/2017   Procedure: LEFT BREAST LUMPECTOMY WITH RADIOACTIVE SEED LOCALIZATION;  Surgeon: Vernetta Berg, MD;  Location: [facility] OR;  Service: General;  Laterality: Left;   BREAST SURGERY     Reduction   cataracts Bilateral 2012   COLONOSCOPY  2021   COLPOSCOPY     DILATATION & CURETTAGE/HYSTEROSCOPY WITH MYOSURE N/A 12/02/2020   Procedure: DILATATION & CURETTAGE/HYSTEROSCOPY WITH MYOSURE;  Surgeon: Lavoie, Marie-Lyne, MD;  Location: [facility];  Service: Gynecology;  Laterality: N/A;   DILATION AND CURETTAGE OF UTERUS  1975   DUB   EYE SURGERY     Laser for vision at times both eyes done   GASTRIC ROUX-EN-Y N/A 08/12/2017   Procedure: LAPAROSCOPIC ROUX-EN-Y GASTRIC BYPASS WITH HIATAL HERNIA REPAIR AND UPPER ENDOSCOPY;  Surgeon: [name], Herlene Righter, MD;  Location: [facility] ORS;  Service: General;  Laterality: N/A;   GYNECOLOGIC CRYOSURGERY     MYOMECTOMY     OVARIAN CYST REMOVAL     PELVIC LAPAROSCOPY     DL lysis of adhesions 8024 and 1988   Social History   Socioeconomic History   Marital status: Single    Spouse name: Not on file   Number of children: 2   Years of education: 16   Highest education level: Bachelor's degree (e.g., BA, AB, BS)  Occupational History   Occupation: TEACHER    Employer: [employer]  Tobacco Use   Smoking status: Never   Smokeless tobacco: Never  Vaping Use   Vaping status: Never Used  Substance and Sexual Activity   Alcohol  use: Not Currently    Alcohol /week: 0.0 standard drinks of alcohol    Drug use: Never   Sexual activity: Not Currently    Birth control/protection:  Post-menopausal    Comment: older than 17, less than 5  Other Topics Concern   Not on file  Social History Narrative   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   Living with daughter      Right handed      4 steps inside of the home  Social Drivers of Corporate investment banker Strain: Low Risk  (02/18/2023)   Overall Financial Resource Strain (CARDIA)    Difficulty of Paying Living Expenses: Not hard at all  Food Insecurity: Patient Declined (11/12/2023)   Hunger Vital Sign    Worried About Running Out of Food in the Last Year: Patient declined    Ran Out of Food in the Last Year: Patient declined  Transportation Needs: Patient Declined (11/12/2023)   PRAPARE - Administrator, Civil Service (Medical): Patient declined    Lack of Transportation (Non-Medical): Patient declined  Physical Activity: Insufficiently Active (02/18/2023)   Exercise Vital Sign    Days of Exercise per Week: 3 days    Minutes of Exercise per Session: 30 min  Stress: No Stress Concern Present (02/18/2023)   [employer] of Occupational Health - Occupational Stress Questionnaire    Feeling of Stress : Not at all  Social Connections: Moderately Isolated (02/18/2023)   Social Connection and Isolation Panel    Frequency of Communication with Friends and Family: More than three times a week    Frequency of Social Gatherings with Friends and Family: More than three times a week    Attends Religious Services: More than 4 times per year    Active Member of [employer] or Organizations: No    Attends Banker Meetings: Never    Marital Status: Never married  Intimate Partner Violence: Patient Declined (11/12/2023)   Humiliation, Afraid, Rape, and Kick questionnaire    Fear of Current or Ex-Partner: Patient declined    Emotionally Abused: Patient declined    Physically Abused: Patient declined    Sexually Abused: Patient declined    Current Outpatient Medications on File Prior to Visit  Medication Sig Dispense Refill   albuterol  (PROVENTIL  HFA;VENTOLIN  HFA) 108 (90 Base) MCG/ACT inhaler Inhale 2 puffs into the lungs every 4 (four) hours as needed for wheezing or shortness of breath. 18 g 2   amLODipine  (NORVASC ) 5 MG tablet TAKE 1 TABLET BY MOUTH EVERY DAY 90 tablet 1   ammonium lactate  (AMLACTIN) 12 % lotion Apply 1 application topically as needed for dry skin. 400 g 5   aspirin  EC 81 MG EC tablet Take 1 tablet (81 mg total) by mouth daily. 30 tablet 0   Biotin  5000 MCG TABS Take 10,000 mcg by mouth daily.      calcium -vitamin D  (OSCAL WITH D) 500-200 MG-UNIT tablet Take 1 tablet by mouth daily.     Cholecalciferol (VITAMIN D3 PO) Take 2,000 Units by mouth daily.     clopidogrel  (PLAVIX ) 75 MG tablet TAKE 1 TABLET BY MOUTH EVERY DAY 90 tablet 1   Continuous Glucose Sensor (DEXCOM G7 SENSOR) MISC 3 each by Does not apply route every 30 (thirty) days. Apply 1 sensor every 10 days 9 each 1   Continuous Glucose Sensor (FREESTYLE LIBRE 3 PLUS SENSOR) MISC 1 each by Does not apply route every 14 (fourteen) days. 2 each 11   FARXIGA  10 MG TABS tablet TAKE 1 TABLET BY MOUTH DAILY BEFORE BREAKFAST. 90 tablet 3   furosemide  (LASIX ) 20 MG tablet TAKE 1 TABLET BY MOUTH EVERY DAY 90 tablet 1   glucose blood (FREESTYLE PRECISION NEO TEST) test strip Use as instructed 100 each 12   hydrALAZINE  (APRESOLINE ) 25 MG tablet TAKE 1 TABLET BY MOUTH THREE TIMES A DAY 270 tablet 1   losartan  (COZAAR ) 100 MG tablet TAKE 1 TABLET BY MOUTH EVERY DAY 90 tablet 1  metoprolol  succinate (TOPROL -XL) 100 MG 24 hr tablet TAKE 1 TABLET BY MOUTH DAILY WITH OR IMMEDIATELY FOLLOWING A MEAL 90 tablet 1   montelukast  (SINGULAIR ) 10 MG tablet TAKE 1 TABLET BY MOUTH EVERY DAY 90 tablet 1   mupirocin  ointment (BACTROBAN ) 2 % APPLY TO AFFECTED AREA TWICE A DAY 22 g 0   nystatin -triamcinolone  ointment (MYCOLOG) Apply topically as needed. (Patient taking  differently: Apply 1 Application topically daily as needed (itching).) 30 g 4   OVER THE COUNTER MEDICATION Take 1 tablet by mouth daily. Multivitamin centrum 1 daily     rosuvastatin  (CRESTOR ) 20 MG tablet Take 1 tablet (20 mg total) by mouth daily. 90 tablet 3   Semaglutide , 1 MG/DOSE, (OZEMPIC , 1 MG/DOSE,) 4 MG/3ML SOPN Inject 1 mg into the skin once a week. 9 mL 3   tacrolimus  (PROTOPIC ) 0.1 % ointment Apply topically 2 (two) times daily. (Patient taking differently: Apply 1 Application topically daily as needed (blisters).) 30 g 2   No current facility-administered medications on file prior to visit.   Allergies  Allergen Reactions   Quinapril Cough   Metformin And Related     Loose stool   Family History  Problem Relation Age of Onset   Cancer Maternal Grandmother        Colon Cancer   Asthma Maternal Grandmother    Colon cancer Maternal Grandmother    Diabetes Father    Heart disease Father    Hypertension Father    Hyperlipidemia Father    Stroke Father        passed at age 58 from stroke   Hypertension Mother    Heart disease Mother    Ovarian cancer Mother    Cancer Mother        Lung cancer   Asthma Mother    COPD Mother    Hyperlipidemia Mother    Diabetes Brother    Kidney disease Brother    Hypertension Brother    Yvone' disease Sister    Diabetes Sister    Breast cancer Sister 31   Graves' disease Paternal Grandmother    Hypertension Paternal Grandmother    Heart disease Paternal Grandmother    Alzheimer's disease Paternal Grandmother    Cancer Maternal Grandfather    Heart failure Brother    Prostate cancer Brother    Diabetes Brother    Asthma Brother    Hypertension Brother    Heart failure Brother    Prostate cancer Brother    Stroke Brother    Hypertension Brother    PE: BP 138/80   Pulse 62   Ht 5' 6 (1.676 m)   Wt 152 lb 3.2 oz (69 kg)   SpO2 99%   BMI 24.57 kg/m  Wt Readings from Last 10 Encounters:  12/09/23 152 lb 3.2 oz (69  kg)  11/07/23 151 lb (68.5 kg)  09/27/23 152 lb 9.6 oz (69.2 kg)  07/04/23 157 lb 12.8 oz (71.6 kg)  06/04/23 162 lb (73.5 kg)  05/29/23 158 lb (71.7 kg)  03/29/23 162 lb 3.2 oz (73.6 kg)  02/18/23 167 lb (75.8 kg)  11/21/22 167 lb 12.8 oz (76.1 kg)  11/14/22 173 lb (78.5 kg)   Constitutional: overweight, in NAD Eyes: EOMI, no exophthalmos ENT: no thyromegaly, no cervical lymphadenopathy Cardiovascular: RRR, No MRG, + B LE swelling Respiratory: CTA B Musculoskeletal: no deformities Skin: + B stasis dermatitis rash Neurological: no tremor with outstretched hands  1. DM2, non-insulin -dependent, uncontrolled, with  complications - CVA -  06/2018 - CKD - PDR - PN - severe hypoglycemia in 2018  Component     Latest Ref Rng 10/13/2021  Hemoglobin A1C     4.0 - 5.6 % 8.7 !   Glucose     65 - 99 mg/dL 879 (H)   Glucose Fasting, POC     70 - 99 mg/dL 857 !   Islet Cell Ab     Neg:<1:1  Negative   C-Peptide     0.80 - 3.85 ng/mL 1.92   ZNT8 Antibodies     <15 U/mL <10   Glutamic Acid Decarb Ab     <5 IU/mL <5     No insulin  deficiency or antipancreatic autoimmunity.  2. HL  PLAN:  1. Patient with longstanding, uncontrolled, type 2 diabetes, on oral antidiabetic regimen with SGLT2 inhibitor and also weekly GLP-1 receptor agonist, with improved control in the last year and an HbA1c of 6.2% at last visit.  At last visit, sugars were fluctuating within the target range, but she had hyperglycemic spikes after meals, particularly after dinner.  We did not change the regimen at that time.  She mentions that her insurance was not covering the Dexcom sensor anymore.  We gave her a freestyle libre 3 sensor and discussed about possibly buying it outside insurance if not covered. - at today's visit, she is on the freestyle libre CGM however, when she upgraded her phone, this became not compatible with the [place] app and she also does not have a receiver.  As a consequence, she is not  checking sugars now, but when she was checking previously, they were mostly at goal, with the highest sugars after dinner, where they sometimes increased above 200 when she had a dessert.  However, at today's visit, HbA1c is even lower than before, so, without having more data lower blood sugars, I am reticent to change her regimen.  We sent a prescription for a receiver to her pharmacy and advised her to start using this - I suggested to:  Patient Instructions  Please continue: - Farxiga  10 mg before breakfast - Ozempic  1 mg weekly  Please continue: - Crestor  20 mg daily  Please return in 4-6 months.  - we checked her HbA1c: 6.1% (lower) - advised to check sugars at different times of the day - 4x a day, rotating check times - advised for yearly eye exams >> she is UTD - will recheck her ACR today - return to clinic in 4-6 months  2. HL - Latest lipid panel reviewed from 05/2023: LDL above our target of less than 55, otherwise fractions at goal: Lab Results  Component Value Date   CHOL 164 05/29/2023   HDL 73.70 05/29/2023   LDLCALC 76 05/29/2023   LDLDIRECT 126.7 03/24/2013   TRIG 72.0 05/29/2023   CHOLHDL 2 05/29/2023  - I previously recommended to increase Crestor  from 10 to 20 mg daily.  She is now taking 20 mg daily.  No side effects.  Lela Fendt, MD PhD [facility]

## 2023-12-10 ENCOUNTER — Ambulatory Visit: Payer: Self-pay | Admitting: Internal Medicine

## 2023-12-10 LAB — MICROALBUMIN / CREATININE URINE RATIO
Creatinine, Urine: 75 mg/dL (ref 20–275)
Microalb Creat Ratio: 1008 mg/g{creat} — ABNORMAL HIGH (ref <30)
Microalb, Ur: 75.6 mg/dL

## 2023-12-25 ENCOUNTER — Other Ambulatory Visit: Payer: Self-pay | Admitting: Internal Medicine

## 2023-12-25 DIAGNOSIS — E1159 Type 2 diabetes mellitus with other circulatory complications: Secondary | ICD-10-CM

## 2023-12-25 NOTE — Telephone Encounter (Signed)
 Pt called back and was advised of her results and states that she is agreeing to the referral.

## 2024-01-04 ENCOUNTER — Other Ambulatory Visit: Payer: Self-pay | Admitting: Internal Medicine

## 2024-01-06 ENCOUNTER — Telehealth: Payer: Self-pay

## 2024-01-06 NOTE — Telephone Encounter (Signed)
 Left [facility] information on patient vm for her to give them a call regarding referral that was sent on 12/25/23

## 2024-01-07 ENCOUNTER — Encounter: Admitting: Obstetrics and Gynecology

## 2024-01-22 ENCOUNTER — Telehealth: Payer: Self-pay | Admitting: Internal Medicine

## 2024-01-22 ENCOUNTER — Ambulatory Visit

## 2024-01-22 VITALS — BP 148/78 | HR 62 | Ht 64.0 in | Wt 149.8 lb

## 2024-01-22 DIAGNOSIS — Z0000 Encounter for general adult medical examination without abnormal findings: Secondary | ICD-10-CM

## 2024-01-22 LAB — LAB REPORT - SCANNED
Albumin, Urine POC: 396.3
Creatinine, POC: 44.6 mg/dL
EGFR: 96
Microalb Creat Ratio: 889

## 2024-01-22 NOTE — Patient Instructions (Addendum)
 Ms. Amero , Thank you for taking time out of your busy schedule to complete your Annual Wellness Visit with me. I enjoyed our conversation and look forward to speaking with you again next year. I, as well as your care team,  appreciate your ongoing commitment to your health goals. Please review the following plan we discussed and let me know if I can assist you in the future. Your Game plan/ To Do List    Referrals: If you haven't heard from the office you've been referred to, please reach out to them at the phone provided.   Follow up Visits: We will see or speak with you next year for your Next Medicare AWV with our clinical staff Have you seen your provider in the last 6 months (3 months if uncontrolled diabetes)? Yes  Clinician Recommendations:  Aim for 30 minutes of exercise or brisk walking, 6-8 glasses of water, and 5 servings of fruits and vegetables each day. Educated and advised on getting the Pneumonia and Shingles vaccines in 2025.      This is a list of the screenings recommended for you:  Health Maintenance  Topic Date Due   Zoster (Shingles) Vaccine (1 of 2) 08/13/2006   Pneumococcal Vaccine for age over 80 (2 of 2 - PCV) 07/18/2017   Flu Shot  12/20/2023   COVID-19 Vaccine (4 - 2024-25 season) 01/20/2024   Yearly kidney function blood test for diabetes  05/28/2024   Hemoglobin A1C  06/10/2024   Eye exam for diabetics  09/11/2024   Complete foot exam   09/26/2024   Yearly kidney health urinalysis for diabetes  12/08/2024   Medicare Annual Wellness Visit  01/21/2025   DTaP/Tdap/Td vaccine (2 - Td or Tdap) 10/05/2025   Mammogram  11/04/2025   Colon Cancer Screening  01/22/2026   DEXA scan (bone density measurement)  Completed   Hepatitis C Screening  Completed   HPV Vaccine  Aged Out   Meningitis B Vaccine  Aged Out    Advanced directives: (Provided) Advance directive discussed with you today. I have provided a copy for you to complete at home and have notarized.  Once this is complete, please bring a copy in to our office so we can scan it into your chart.  Advance Care Planning is important because it:  [x]  Makes sure you receive the medical care that is consistent with your values, goals, and preferences  [x]  It provides guidance to your family and loved ones and reduces their decisional burden about whether or not they are making the right decisions based on your wishes.  Follow the link provided in your after visit summary or read over the paperwork we have mailed to you to help you started getting your Advance Directives in place. If you need assistance in completing these, please reach out to us  so that we can help you!

## 2024-01-22 NOTE — Progress Notes (Signed)
 Subjective:   Rebekah Kennedy is a 68 y.o. who presents for a Medicare Wellness preventive visit.  As a reminder, Annual Wellness Visits don't include a physical exam, and some assessments may be limited, especially if this visit is performed virtually. We may recommend an in-person follow-up visit with your provider if needed.  Visit Complete: In person   Persons Participating in Visit: Patient.  AWV Questionnaire: No: Patient Medicare AWV questionnaire was not completed prior to this visit.  Cardiac Risk Factors include: advanced age (>76men, >29 women);diabetes mellitus;dyslipidemia;hypertension     Objective:    Today's Vitals   01/22/24 1004 01/22/24 1019  BP: (!) 162/78 (!) 148/78  Pulse: 62   SpO2: 99%   Weight: 149 lb 12.8 oz (67.9 kg)   Height: 5' 4 (1.626 m)    Body mass index is 25.71 kg/m.     01/22/2024   10:04 AM 06/04/2023    1:05 PM 02/18/2023    8:34 AM 03/12/2022    1:50 PM 12/29/2021    9:35 AM 03/27/2021   11:37 PM 12/02/2020    7:30 AM  Advanced Directives  Does Patient Have a Medical Advance Directive? No Yes No No No No No  Type of Furniture conservator/restorer;Living will       Would patient like information on creating a medical advance directive? Yes (MAU/Ambulatory/Procedural Areas - Information given)  Yes (MAU/Ambulatory/Procedural Areas - Information given)  No - Patient declined  Yes (MAU/Ambulatory/Procedural Areas - Information given)    Current Medications (verified) Outpatient Encounter Medications as of 01/22/2024  Medication Sig   albuterol  (PROVENTIL  HFA;VENTOLIN  HFA) 108 (90 Base) MCG/ACT inhaler Inhale 2 puffs into the lungs every 4 (four) hours as needed for wheezing or shortness of breath.   amLODipine  (NORVASC ) 5 MG tablet TAKE 1 TABLET BY MOUTH EVERY DAY   ammonium lactate  (AMLACTIN) 12 % lotion Apply 1 application topically as needed for dry skin.   aspirin  EC 81 MG EC tablet Take 1 tablet (81 mg total) by  mouth daily.   Biotin  5000 MCG TABS Take 10,000 mcg by mouth daily.    calcium -vitamin D  (OSCAL WITH D) 500-200 MG-UNIT tablet Take 1 tablet by mouth daily.   Cholecalciferol (VITAMIN D3 PO) Take 2,000 Units by mouth daily.   clopidogrel  (PLAVIX ) 75 MG tablet TAKE 1 TABLET BY MOUTH EVERY DAY   Continuous Glucose Receiver (FREESTYLE LIBRE 3 READER) DEVI Use to monitor glucose continuously.   Continuous Glucose Sensor (FREESTYLE LIBRE 3 PLUS SENSOR) MISC 1 each by Does not apply route every 14 (fourteen) days.   dapagliflozin  propanediol (FARXIGA ) 10 MG TABS tablet Take 1 tablet (10 mg total) by mouth daily before breakfast.   furosemide  (LASIX ) 20 MG tablet TAKE 1 TABLET BY MOUTH EVERY DAY   glucose blood (FREESTYLE PRECISION NEO TEST) test strip Use as instructed   hydrALAZINE  (APRESOLINE ) 25 MG tablet TAKE 1 TABLET BY MOUTH THREE TIMES A DAY   losartan  (COZAAR ) 100 MG tablet TAKE 1 TABLET BY MOUTH EVERY DAY   metoprolol  succinate (TOPROL -XL) 100 MG 24 hr tablet TAKE 1 TABLET BY MOUTH DAILY WITH OR IMMEDIATELY FOLLOWING A MEAL   montelukast  (SINGULAIR ) 10 MG tablet TAKE 1 TABLET BY MOUTH EVERY DAY   mupirocin  ointment (BACTROBAN ) 2 % APPLY TO AFFECTED AREA TWICE A DAY   nystatin -triamcinolone  ointment (MYCOLOG) Apply topically as needed. (Patient taking differently: Apply 1 Application topically daily as needed (itching).)   OVER THE COUNTER MEDICATION  Take 1 tablet by mouth daily. Multivitamin centrum 1 daily   rosuvastatin  (CRESTOR ) 20 MG tablet Take 1 tablet (20 mg total) by mouth daily.   Semaglutide , 1 MG/DOSE, (OZEMPIC , 1 MG/DOSE,) 4 MG/3ML SOPN Inject 1 mg into the skin once a week.   tacrolimus  (PROTOPIC ) 0.1 % ointment Apply topically 2 (two) times daily. (Patient taking differently: Apply 1 Application topically daily as needed (blisters).)   No facility-administered encounter medications on file as of 01/22/2024.    Allergies (verified) Quinapril and Metformin and related    History: 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 issuse resolved   Retinal edema    gets steriod inj in the eyes     Sleep apnea    not needed since 100 pound weight loss 2019-2020   Splenomegaly    in college   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   left side pt states, left wide weak per pt   Stroke [facility]) 2012   right pontine cva with left hemiparesis   Past Surgical History:  Procedure Laterality Date   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   APPENDECTOMY     yrs ago per pt 0n 11-29-2020   BIOPSY THYROID   05/02/2011   Nonneoplastic goiter   BREAST BIOPSY     BREAST LUMPECTOMY WITH RADIOACTIVE SEED LOCALIZATION Left 07/25/2017   Procedure: LEFT BREAST LUMPECTOMY WITH RADIOACTIVE SEED LOCALIZATION;  Surgeon: Vernetta Berg, MD;  Location: [facility] OR;  Service: General;  Laterality: Left;   BREAST SURGERY     Reduction   cataracts Bilateral 2012   COLONOSCOPY  2021   COLPOSCOPY     DILATATION & CURETTAGE/HYSTEROSCOPY WITH MYOSURE N/A 12/02/2020   Procedure: DILATATION & CURETTAGE/HYSTEROSCOPY WITH MYOSURE;  Surgeon: Lavoie, Marie-Lyne, MD;  Location: [facility];  Service: Gynecology;  Laterality: N/A;   DILATION AND CURETTAGE OF UTERUS  1975   DUB   EYE SURGERY     Laser for vision at times both eyes done   GASTRIC ROUX-EN-Y N/A 08/12/2017    Procedure: LAPAROSCOPIC ROUX-EN-Y GASTRIC BYPASS WITH HIATAL HERNIA REPAIR AND UPPER ENDOSCOPY;  Surgeon: [name], Herlene Righter, MD;  Location: [facility] ORS;  Service: General;  Laterality: N/A;   GYNECOLOGIC CRYOSURGERY     MYOMECTOMY     OVARIAN CYST REMOVAL     PELVIC LAPAROSCOPY     DL lysis of adhesions 8024 and 1988   Family History  Problem Relation Age of Onset   Cancer Maternal Grandmother        Colon Cancer   Asthma Maternal Grandmother    Colon cancer Maternal Grandmother    Diabetes Father    Heart disease Father    Hypertension Father  Hyperlipidemia Father    Stroke Father        passed at age 31 from stroke   Hypertension Mother    Heart disease Mother    Ovarian cancer Mother    Cancer Mother        Lung cancer   Asthma Mother    COPD Mother    Hyperlipidemia Mother    Diabetes Brother    Kidney disease Brother    Hypertension Brother    Yvone' disease Sister    Diabetes Sister    Breast cancer Sister 61   Graves' disease Paternal Grandmother    Hypertension Paternal Grandmother    Heart disease Paternal Grandmother    Alzheimer's disease Paternal Grandmother    Cancer Maternal Grandfather    Heart failure Brother    Prostate cancer Brother    Diabetes Brother    Asthma Brother    Hypertension Brother    Heart failure Brother    Prostate cancer Brother    Stroke Brother    Hypertension Brother    Social History   Socioeconomic History   Marital status: Single    Spouse name: Not on file   Number of children: 2   Years of education: 16   Highest education level: Bachelor's degree (e.g., BA, AB, BS)  Occupational History   Occupation: TEACHER    Employer: [employer]  Tobacco Use   Smoking status: Never   Smokeless tobacco: Never  Vaping Use   Vaping status: Never Used  Substance and Sexual Activity   Alcohol  use: Yes    Alcohol /week: 1.0 standard drink of alcohol     Types: 1 Glasses of wine per week    Comment: occas    Drug use: Never   Sexual activity: Not Currently    Birth control/protection: Post-menopausal    Comment: older than 17, less than 5  Other Topics Concern   Not on file  Social History Narrative   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   Living with daughter      Right handed      4 steps inside of the home      Social Drivers of Health   Financial Resource Strain: Low Risk  (01/22/2024)   Overall Financial Resource Strain (CARDIA)    Difficulty of Paying Living Expenses: Not hard at all  Food Insecurity: No Food Insecurity (01/22/2024)   Hunger Vital Sign    Worried About Running Out of Food in the Last Year: Never true    Ran Out of Food in the Last Year: Never true  Transportation Needs: No Transportation Needs (01/22/2024)   PRAPARE - Administrator, Civil Service (Medical): No    Lack of Transportation (Non-Medical): No  Physical Activity: Insufficiently Active (01/22/2024)   Exercise Vital Sign    Days of Exercise per Week: 3 days    Minutes of Exercise per Session: 30 min  Stress: No Stress Concern Present (01/22/2024)   [employer] of Occupational Health - Occupational Stress Questionnaire    Feeling of Stress: Not at all  Social Connections: Moderately Isolated (01/22/2024)   Social Connection and Isolation Panel    Frequency of Communication with Friends and Family: More than three times a week    Frequency of Social Gatherings with Friends and Family: More than three times a week    Attends Religious Services: More than 4 times per year  Active Member of Clubs or Organizations: No    Attends Banker Meetings: Never    Marital Status: Never married    Tobacco Counseling Counseling given: Not Answered    Clinical Intake:  Pre-visit preparation completed: Yes  Pain : No/denies pain     BMI - recorded: 25.71 Nutritional Status: BMI 25 -29 Overweight Nutritional Risks:  None Diabetes: Yes CBG done?: No CBG resulted in Enter/ Edit results?: No Did pt. bring in CBG monitor from home?: No  Lab Results  Component Value Date   HGBA1C 6.1 (A) 12/09/2023   HGBA1C 6.2 (A) 09/27/2023   HGBA1C 7.0 (H) 05/29/2023     How often do you need to have someone help you when you read instructions, pamphlets, or other written materials from your doctor or pharmacy?: 1 - Never  Interpreter Needed?: No  Information entered by :: Verdie Saba, CMA   Activities of Daily Living     01/22/2024   10:08 AM 02/18/2023    8:34 AM  In your present state of health, do you have any difficulty performing the following activities:  Hearing? 0 0  Vision? 0 0  Difficulty concentrating or making decisions? 0 0  Walking or climbing stairs? 1 0  Comment uses a cane/walker   Dressing or bathing? 0 0  Doing errands, shopping? 0 0  Preparing Food and eating ? N N  Using the Toilet? N N  In the past six months, have you accidently leaked urine? CINDERELLA CINDERELLA  Comment wears a pad/depend   Do you have problems with loss of bowel control? N N  Managing your Medications? N N  Managing your Finances? N N  Housekeeping or managing your Housekeeping? N N    Patient Care Team: Geofm Glade PARAS, MD as PCP - General (Internal Medicine) Kassie Mallick, MD (Inactive) (Internal Medicine) Tobie Tonita POUR, DO as Consulting Physician (Neurology) Patel, Donika K, DO as Consulting Physician (Neurology) Glendia Simmonds, OD as Referring Physician (Optometry) Trixie File, MD as Consulting Physician (Endocrinology)  I have updated your Care Teams any recent Medical Services you may have received from other providers in the past year.     Assessment:   This is a routine wellness examination for Annaly.  Hearing/Vision screen Hearing Screening - Comments:: Denies hearing difficulties   Vision Screening - Comments:: Wears rx glasses - up to date with routine eye exams with Simmonds Glendia   Goals Addressed                This Visit's Progress     Patient Stated (pt-stated)        Patient stated she plans to continue monitor her blood pressure and manage medications       Depression Screen     01/22/2024   10:09 AM 05/29/2023    8:40 AM 02/18/2023    8:33 AM 11/14/2022   10:55 AM 07/18/2022    4:18 PM 02/07/2022   10:36 AM 12/29/2021    9:32 AM  PHQ 2/9 Scores  PHQ - 2 Score 0 0 0 0 0 0 0  PHQ- 9 Score 3    0      Fall Risk     01/22/2024   10:08 AM 06/04/2023    1:05 PM 05/29/2023    8:40 AM 02/18/2023    8:34 AM 11/14/2022   10:54 AM  Fall Risk   Falls in the past year? 0  0 0 0  Number falls in past  yr: 0 0 0 0 0  Injury with Fall? 0 0 0 0 0  Risk for fall due to : No Fall Risks;Impaired balance/gait  No Fall Risks No Fall Risks No Fall Risks  Follow up Falls evaluation completed;Falls prevention discussed Falls evaluation completed  Falls evaluation completed Falls evaluation completed    MEDICARE RISK AT HOME:  Medicare Risk at Home Any stairs in or around the home?: Yes (outside) If so, are there any without handrails?: No Home free of loose throw rugs in walkways, pet beds, electrical cords, etc?: Yes Adequate lighting in your home to reduce risk of falls?: Yes Life alert?: No Use of a cane, walker or w/c?: Yes (cane/walker) Grab bars in the bathroom?: No Shower chair or bench in shower?: No Elevated toilet seat or a handicapped toilet?: No  TIMED UP AND GO:  Was the test performed?  No  Cognitive Function: 6CIT completed        01/22/2024   10:19 AM 02/18/2023    8:35 AM 12/29/2021    9:39 AM  6CIT Screen  What Year? 0 points 0 points 0 points  What month? 0 points 0 points 0 points  What time? 0 points 0 points 0 points  Count back from 20 0 points 0 points 0 points  Months in reverse 0 points 0 points 0 points  Repeat phrase 0 points 0 points 0 points  Total Score 0 points 0 points 0 points    Immunizations Immunization History  Administered Date(s)  Administered   Fluad Quad(high Dose 65+) 02/12/2022   INFLUENZA, HIGH DOSE SEASONAL PF 03/01/2023   Influenza Split 03/01/2011   Influenza Whole 02/18/2010   Influenza,inj,Quad PF,6+ Mos 07/18/2015, 02/03/2016, 01/22/2018, 02/12/2019, 02/09/2020, 02/10/2021   Influenza-Unspecified 02/18/2017   PFIZER(Purple Top)SARS-COV-2 Vaccination 07/30/2019, 08/24/2019   Pfizer(Comirnaty)Fall Seasonal Vaccine 12 years and older 03/01/2023   Pneumococcal Polysaccharide-23 07/18/2016   Respiratory Syncytial Virus Vaccine,Recomb Aduvanted(Arexvy) 02/12/2022   Tdap 10/06/2015   Zoster, Live 06/07/2011    Screening Tests Health Maintenance  Topic Date Due   Zoster Vaccines- Shingrix (1 of 2) 08/13/2006   Pneumococcal Vaccine: 50+ Years (2 of 2 - PCV) 07/18/2017   INFLUENZA VACCINE  12/20/2023   COVID-19 Vaccine (4 - 2024-25 season) 01/20/2024   Diabetic kidney evaluation - eGFR measurement  05/28/2024   HEMOGLOBIN A1C  06/10/2024   OPHTHALMOLOGY EXAM  09/11/2024   FOOT EXAM  09/26/2024   Diabetic kidney evaluation - Urine ACR  12/08/2024   Medicare Annual Wellness (AWV)  01/21/2025   DTaP/Tdap/Td (2 - Td or Tdap) 10/05/2025   MAMMOGRAM  11/04/2025   Colonoscopy  01/22/2026   DEXA SCAN  Completed   Hepatitis C Screening  Completed   HPV VACCINES  Aged Out   Meningococcal B Vaccine  Aged Out    Health Maintenance  Health Maintenance Due  Topic Date Due   Zoster Vaccines- Shingrix (1 of 2) 08/13/2006   Pneumococcal Vaccine: 50+ Years (2 of 2 - PCV) 07/18/2017   INFLUENZA VACCINE  12/20/2023   COVID-19 Vaccine (4 - 2024-25 season) 01/20/2024   Health Maintenance Items Addressed: 01/22/2024  Additional Screening:  Vision Screening: Recommended annual ophthalmology exams for early detection of glaucoma and other disorders of the eye. Would you like a referral to an eye doctor? No    Dental Screening: Recommended annual dental exams for proper oral hygiene  Community Resource Referral /  Chronic Care Management: CRR required this visit?  No   CCM required  this visit?  No   Plan:    I have personally reviewed and noted the following in the patient's chart:   Medical and social history Use of alcohol , tobacco or illicit drugs  Current medications and supplements including opioid prescriptions. Patient is not currently taking opioid prescriptions. Functional ability and status Nutritional status Physical activity Advanced directives List of other physicians Hospitalizations, surgeries, and ER visits in previous 12 months Vitals Screenings to include cognitive, depression, and falls Referrals and appointments  In addition, I have reviewed and discussed with patient certain preventive protocols, quality metrics, and best practice recommendations. A written personalized care plan for preventive services as well as general preventive health recommendations were provided to patient.   Verdie CHRISTELLA Saba, CMA   01/22/2024   After Visit Summary: (In Person-Declined) Patient declined AVS at this time.  Notes: Nothing significant to report at this time.

## 2024-01-22 NOTE — Telephone Encounter (Signed)
 Pt dropped off a disability license plate form and will like to pick it up once its completed..  Please advise, Thanks

## 2024-01-22 NOTE — Telephone Encounter (Signed)
 Received and placed in Dr. Geofm folder to complete

## 2024-01-23 ENCOUNTER — Encounter: Payer: Self-pay | Admitting: Internal Medicine

## 2024-01-23 ENCOUNTER — Ambulatory Visit

## 2024-01-23 VITALS — BP 187/94 | HR 82 | Wt 150.8 lb

## 2024-01-23 DIAGNOSIS — M793 Panniculitis, unspecified: Secondary | ICD-10-CM

## 2024-01-23 DIAGNOSIS — I10 Essential (primary) hypertension: Secondary | ICD-10-CM

## 2024-01-23 DIAGNOSIS — Z9884 Bariatric surgery status: Secondary | ICD-10-CM

## 2024-01-23 MED ORDER — ONDANSETRON 4 MG PO TBDP: 4.0000 mg | ORAL_TABLET | Freq: Three times a day (TID) | ORAL | 0 refills | Status: DC | PRN

## 2024-01-23 MED ORDER — OXYCODONE HCL 5 MG PO TABS
5.0000 mg | ORAL_TABLET | Freq: Four times a day (QID) | ORAL | 0 refills | Status: AC | PRN
Start: 2024-01-23 — End: 2024-01-26

## 2024-01-23 MED ORDER — ACETAMINOPHEN 500 MG PO TABS: 500.0000 mg | ORAL_TABLET | Freq: Four times a day (QID) | ORAL | 0 refills | Status: DC | PRN

## 2024-01-23 NOTE — Progress Notes (Signed)
 Patient ID: CHALICE PHILBERT, female    DOB: 09-20-1955, 68 y.o.   MRN: [id number]  Chief Complaint  Patient presents with   Pre-op Exam    No diagnosis found.   History of Present Illness: [name] is a 68 y.o.  female  with a history of morbid obesity and bariatric surgery with massive weight loss.  She presents for preoperative evaluation for upcoming procedure, panniculectomy, scheduled for panniculectomy with Dr. Waddell.  The patient has not had problems with anesthesia.   Job: Retired  [employer] Significant for:  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 issuse resolved   Retinal edema    gets steriod inj in the eyes     Sleep apnea    not needed since 100 pound weight loss 2019-2020   Splenomegaly    in college   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   left side pt states, left wide weak per pt   Stroke [facility]) 2012   right pontine cva with left hemiparesis   Past Surgical History:  Procedure Laterality Date   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   APPENDECTOMY     yrs ago per pt 0n 11-29-2020   BIOPSY THYROID   05/02/2011   Nonneoplastic goiter   BREAST BIOPSY     BREAST LUMPECTOMY WITH RADIOACTIVE SEED LOCALIZATION Left 07/25/2017   Procedure: LEFT BREAST LUMPECTOMY WITH RADIOACTIVE SEED LOCALIZATION;  Surgeon: Vernetta Berg, MD;  Location:  [facility] OR;  Service: General;  Laterality: Left;   BREAST SURGERY     Reduction   cataracts Bilateral 2012   COLONOSCOPY  2021   COLPOSCOPY     DILATATION & CURETTAGE/HYSTEROSCOPY WITH MYOSURE N/A 12/02/2020   Procedure: DILATATION & CURETTAGE/HYSTEROSCOPY WITH MYOSURE;  Surgeon: Lavoie, Marie-Lyne, MD;  Location: [facility];  Service: Gynecology;  Laterality: N/A;   DILATION AND CURETTAGE OF UTERUS  1975   DUB   EYE SURGERY     Laser for vision at times both eyes done   GASTRIC ROUX-EN-Y N/A 08/12/2017   Procedure: LAPAROSCOPIC ROUX-EN-Y GASTRIC BYPASS WITH HIATAL HERNIA REPAIR AND UPPER ENDOSCOPY;  Surgeon: [name], Herlene Righter, MD;  Location: [facility];  Service: General;  Laterality: N/A;   GYNECOLOGIC CRYOSURGERY     MYOMECTOMY     OVARIAN CYST REMOVAL     PELVIC LAPAROSCOPY     DL lysis of adhesions 8024 and 1988      Past Medical History: Allergies: Allergies  Allergen Reactions   Quinapril Cough   Metformin And Related     Loose stool    Current Medications:  Current Outpatient Medications:    albuterol  (PROVENTIL  HFA;VENTOLIN  HFA) 108 (90 Base) MCG/ACT  inhaler, Inhale 2 puffs into the lungs every 4 (four) hours as needed for wheezing or shortness of breath., Disp: 18 g, Rfl: 2   amLODipine  (NORVASC ) 5 MG tablet, TAKE 1 TABLET BY MOUTH EVERY DAY, Disp: 90 tablet, Rfl: 1   ammonium lactate  (AMLACTIN) 12 % lotion, Apply 1 application topically as needed for dry skin., Disp: 400 g, Rfl: 5   aspirin  EC 81 MG EC tablet, Take 1 tablet (81 mg total) by mouth daily., Disp: 30 tablet, Rfl: 0   Biotin  5000 MCG TABS, Take 10,000 mcg by mouth daily. , Disp: , Rfl:    calcium -vitamin D  (OSCAL WITH D) 500-200 MG-UNIT tablet, Take 1 tablet by mouth daily., Disp: , Rfl:    Cholecalciferol (VITAMIN D3 PO), Take 2,000 Units by mouth daily., Disp: , Rfl:    clopidogrel  (PLAVIX ) 75 MG tablet, TAKE 1 TABLET BY MOUTH EVERY DAY, Disp: 90 tablet, Rfl: 1   Continuous Glucose  Receiver (FREESTYLE LIBRE 3 READER) DEVI, Use to monitor glucose continuously., Disp: 1 each, Rfl: 0   Continuous Glucose Sensor (FREESTYLE LIBRE 3 PLUS SENSOR) MISC, 1 each by Does not apply route every 14 (fourteen) days., Disp: 2 each, Rfl: 11   dapagliflozin  propanediol (FARXIGA ) 10 MG TABS tablet, Take 1 tablet (10 mg total) by mouth daily before breakfast., Disp: 90 tablet, Rfl: 3   furosemide  (LASIX ) 20 MG tablet, TAKE 1 TABLET BY MOUTH EVERY DAY, Disp: 90 tablet, Rfl: 1   glucose blood (FREESTYLE PRECISION NEO TEST) test strip, Use as instructed, Disp: 100 each, Rfl: 12   hydrALAZINE  (APRESOLINE ) 25 MG tablet, TAKE 1 TABLET BY MOUTH THREE TIMES A DAY, Disp: 270 tablet, Rfl: 1   losartan  (COZAAR ) 100 MG tablet, TAKE 1 TABLET BY MOUTH EVERY DAY, Disp: 90 tablet, Rfl: 1   metoprolol  succinate (TOPROL -XL) 100 MG 24 hr tablet, TAKE 1 TABLET BY MOUTH DAILY WITH OR IMMEDIATELY FOLLOWING A MEAL, Disp: 90 tablet, Rfl: 1   montelukast  (SINGULAIR ) 10 MG tablet, TAKE 1 TABLET BY MOUTH EVERY DAY, Disp: 90 tablet, Rfl: 1   mupirocin  ointment (BACTROBAN ) 2 %, APPLY TO AFFECTED AREA TWICE A DAY, Disp: 22 g, Rfl: 0   nystatin -triamcinolone  ointment (MYCOLOG), Apply topically as needed. (Patient taking differently: Apply 1 Application topically daily as needed (itching).), Disp: 30 g, Rfl: 4   OVER THE COUNTER MEDICATION, Take 1 tablet by mouth daily. Multivitamin centrum 1 daily, Disp: , Rfl:    rosuvastatin  (CRESTOR ) 20 MG tablet, Take 1 tablet (20 mg total) by mouth daily., Disp: 90 tablet, Rfl: 3   Semaglutide , 1 MG/DOSE, (OZEMPIC , 1 MG/DOSE,) 4 MG/3ML SOPN, Inject 1 mg into the skin once a week., Disp: 9 mL, Rfl: 3   tacrolimus  (PROTOPIC ) 0.1 % ointment, Apply topically 2 (two) times daily. (Patient taking differently: Apply 1 Application topically daily as needed (blisters).), Disp: 30 g, Rfl: 2  Past Medical Problems: Past Medical History:  Diagnosis Date   Ambulates with cane 11/29/2020   outside  of home   ASTHMA NOS W/ACUTE EXACERBATION 07/10/2010   Cervical dysplasia    DEPRESSION 03/16/2007   DIABETES MELLITUS, TYPE II 12/01/2006   dx 2004   Dysmenorrhea    Fibroid    History of blood product transfusion    took own blood that was banked in 1990's   HYPERLIPIDEMIA 12/01/2006   HYPERTENSION 07/07/2006   Macular edema    Neuropathy    fingertips and tose some numbness, right foot numb at times  Obesity    Osteopenia 05/2017   T score -1.5 FRAX 2.6% / 0.1%   Pneumonia    1990's and 2000 none since   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 issuse resolved   Retinal edema    gets steriod inj in the eyes     Sleep apnea    not needed since 100 pound weight loss 2019-2020   Splenomegaly    in college   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   left side pt states, left wide weak per pt   Stroke [facility]) 2012   right pontine cva with left hemiparesis    Past Surgical History: Past Surgical History:  Procedure Laterality Date   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   APPENDECTOMY     yrs ago per pt 0n 11-29-2020   BIOPSY THYROID   05/02/2011   Nonneoplastic goiter   BREAST BIOPSY     BREAST LUMPECTOMY WITH RADIOACTIVE SEED LOCALIZATION Left 07/25/2017   Procedure: LEFT BREAST LUMPECTOMY WITH RADIOACTIVE SEED LOCALIZATION;  Surgeon: Vernetta Berg, MD;  Location: [facility] OR;  Service: General;  Laterality: Left;   BREAST SURGERY     Reduction   cataracts Bilateral 2012   COLONOSCOPY  2021   COLPOSCOPY     DILATATION & CURETTAGE/HYSTEROSCOPY WITH MYOSURE N/A 12/02/2020   Procedure: DILATATION & CURETTAGE/HYSTEROSCOPY WITH MYOSURE;  Surgeon: Lavoie, Marie-Lyne, MD;  Location: [facility];  Service: Gynecology;  Laterality: N/A;   DILATION AND CURETTAGE OF UTERUS  1975   DUB   EYE SURGERY     Laser for vision at times both eyes done   GASTRIC ROUX-EN-Y N/A 08/12/2017   Procedure: LAPAROSCOPIC ROUX-EN-Y GASTRIC BYPASS WITH HIATAL HERNIA  REPAIR AND UPPER ENDOSCOPY;  Surgeon: [name], Herlene Righter, MD;  Location: [facility];  Service: General;  Laterality: N/A;   GYNECOLOGIC CRYOSURGERY     MYOMECTOMY     OVARIAN CYST REMOVAL     PELVIC LAPAROSCOPY     DL lysis of adhesions 8024 and 1988    Social History: Social History   Socioeconomic History   Marital status: Single    Spouse name: Not on file   Number of children: 2   Years of education: 16   Highest education level: Bachelor's degree (e.g., BA, AB, BS)  Occupational History   Occupation: TEACHER    Employer: [employer]  Tobacco Use   Smoking status: Never   Smokeless tobacco: Never  Vaping Use   Vaping status: Never Used  Substance and Sexual Activity   Alcohol  use: Yes    Alcohol /week: 1.0 standard drink of alcohol     Types: 1 Glasses of wine per week    Comment: occas   Drug use: Never   Sexual activity: Not Currently    Birth control/protection: Post-menopausal    Comment: older than 17, less than 5  Other Topics Concern   Not on file  Social History Narrative   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   Living with daughter      Right handed      4 steps inside of the home      Social Drivers of Health   Financial Resource Strain: Low Risk  (01/22/2024)   Overall Financial Resource Strain (CARDIA)    Difficulty of Paying Living Expenses: Not hard at all  Food Insecurity: No Food Insecurity (01/22/2024)  Hunger Vital Sign    Worried About Running Out of Food in the Last Year: Never true    Ran Out of Food in the Last Year: Never true  Transportation Needs: No Transportation Needs (01/22/2024)   PRAPARE - Administrator, Civil Service (Medical): No    Lack of Transportation (Non-Medical): No  Physical Activity: Insufficiently Active (01/22/2024)   Exercise Vital Sign    Days of Exercise per Week: 3 days    Minutes of Exercise per Session: 30 min  Stress: No Stress  Concern Present (01/22/2024)   [employer] of Occupational Health - Occupational Stress Questionnaire    Feeling of Stress: Not at all  Social Connections: Moderately Isolated (01/22/2024)   Social Connection and Isolation Panel    Frequency of Communication with Friends and Family: More than three times a week    Frequency of Social Gatherings with Friends and Family: More than three times a week    Attends Religious Services: More than 4 times per year    Active Member of [employer] or Organizations: No    Attends Banker Meetings: Never    Marital Status: Never married  Intimate Partner Violence: Not At Risk (01/22/2024)   Humiliation, Afraid, Rape, and Kick questionnaire    Fear of Current or Ex-Partner: No    Emotionally Abused: No    Physically Abused: No    Sexually Abused: No    Family History: Family History  Problem Relation Age of Onset   Cancer Maternal Grandmother        Colon Cancer   Asthma Maternal Grandmother    Colon cancer Maternal Grandmother    Diabetes Father    Heart disease Father    Hypertension Father    Hyperlipidemia Father    Stroke Father        passed at age 98 from stroke   Hypertension Mother    Heart disease Mother    Ovarian cancer Mother    Cancer Mother        Lung cancer   Asthma Mother    COPD Mother    Hyperlipidemia Mother    Diabetes Brother    Kidney disease Brother    Hypertension Brother    Graves' disease Sister    Diabetes Sister    Breast cancer Sister 29   Graves' disease Paternal Grandmother    Hypertension Paternal Grandmother    Heart disease Paternal Grandmother    Alzheimer's disease Paternal Grandmother    Cancer Maternal Grandfather    Heart failure Brother    Prostate cancer Brother    Diabetes Brother    Asthma Brother    Hypertension Brother    Heart failure Brother    Prostate cancer Brother    Stroke Brother    Hypertension Brother     Review of Systems: ROS  Physical Exam: Vital  Signs BP (!) 180/91 (BP Location: Left Arm, Patient Position: Sitting, Cuff Size: Normal) Comment: Pt just took HTN med before appt. I will recheck  Pulse 82   Wt 150 lb 12.8 oz (68.4 kg)   SpO2 98%   BMI 25.88 kg/m   Physical Exam RN as chaperone Constitutional:      General: Not in acute distress.    Appearance: Normal appearance. Not ill-appearing.  HENT:     Head: Normocephalic and atraumatic.  Eyes:     Pupils: Pupils are equal, round. Cardiovascular:     Hemodynamically normal Pulmonary:  Effort: No respiratory distress or increased work of breathing.  Speaks in full sentences. Abdominal:     General: No distension.   Skin:    General: Skin is warm and dry.     Findings: No erythema or rash.  Neurological:     Mental Status: Alert and oriented to person, place, and time.  Psychiatric:        Mood and Affect: Mood normal.        Behavior: Behavior normal.   Assessment/Plan: The patient is scheduled for panniculectomy with Dr. Waddell.  Risks, benefits, and alternatives of procedure discussed, questions answered and consent obtained.    Hypertensive today - Instructed to go to a walk in clinic/PCP today. The blood pressure needs to be controlled before surgery. This was discussed with Dr. Waddell.  Smoking Status: Non-smoker; Counseling Given? N/A  Caprini Score: 6; Risk Factors include: Age 67, BMI >25, Swollen legs and length of planned surgery. Recommendation for mechanical & pharmacological prophylaxis. Encourage early ambulation.   Pictures obtained: Initial consultation.  Post-op Rx sent to pharmacy:  [id number] [place] [place], [place] - [address] [place] [address] Phone: [phone]  Fax: [phone]  Patient was provided with the General Surgical Risk consent document and Pain Medication Agreement prior to their appointment.  They had adequate time to read through the risk consent documents and Pain Medication Agreement. We  also discussed them in person together during this preop appointment. All of their questions were answered to their satisfaction.  Recommended calling if they have any further questions.  Risk consent form and Pain Medication Agreement to be scanned into patient's chart.  Patient will have to hold the following medications before surgery: - Plavix  for 3 days - Farxiga  5 days - Ozempic  7 days  Patient expressed understanding.    Electronically signed by: Shayana Hornstein M Akina Maish, MD 01/23/2024 9:59 AM

## 2024-01-23 NOTE — Telephone Encounter (Signed)
 Form completed and message left for patient.  Form left up front for pick up

## 2024-01-23 NOTE — H&P (View-Only) (Signed)
 Patient ID: Rebekah Kennedy, female    DOB: 09-20-1955, 68 y.o.   MRN: [id number]  Chief Complaint  Patient presents with   Pre-op Exam    No diagnosis found.   History of Present Illness: Rebekah Kennedy is a 68 y.o.  female  with a history of morbid obesity and bariatric surgery with massive weight loss.  She presents for preoperative evaluation for upcoming procedure, panniculectomy, scheduled for panniculectomy with Dr. Waddell.  The patient has not had problems with anesthesia.   Job: Retired  [employer] Significant for:  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 issuse resolved   Retinal edema    gets steriod inj in the eyes     Sleep apnea    not needed since 100 pound weight loss 2019-2020   Splenomegaly    in college   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   left side pt states, left wide weak per pt   Stroke [facility]) 2012   right pontine cva with left hemiparesis   Past Surgical History:  Procedure Laterality Date   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   APPENDECTOMY     yrs ago per pt 0n 11-29-2020   BIOPSY THYROID   05/02/2011   Nonneoplastic goiter   BREAST BIOPSY     BREAST LUMPECTOMY WITH RADIOACTIVE SEED LOCALIZATION Left 07/25/2017   Procedure: LEFT BREAST LUMPECTOMY WITH RADIOACTIVE SEED LOCALIZATION;  Surgeon: Vernetta Berg, MD;  Location:  [facility] OR;  Service: General;  Laterality: Left;   BREAST SURGERY     Reduction   cataracts Bilateral 2012   COLONOSCOPY  2021   COLPOSCOPY     DILATATION & CURETTAGE/HYSTEROSCOPY WITH MYOSURE N/A 12/02/2020   Procedure: DILATATION & CURETTAGE/HYSTEROSCOPY WITH MYOSURE;  Surgeon: Lavoie, Marie-Lyne, MD;  Location: [facility];  Service: Gynecology;  Laterality: N/A;   DILATION AND CURETTAGE OF UTERUS  1975   DUB   EYE SURGERY     Laser for vision at times both eyes done   GASTRIC ROUX-EN-Y N/A 08/12/2017   Procedure: LAPAROSCOPIC ROUX-EN-Y GASTRIC BYPASS WITH HIATAL HERNIA REPAIR AND UPPER ENDOSCOPY;  Surgeon: [name], Herlene Righter, MD;  Location: [facility];  Service: General;  Laterality: N/A;   GYNECOLOGIC CRYOSURGERY     MYOMECTOMY     OVARIAN CYST REMOVAL     PELVIC LAPAROSCOPY     DL lysis of adhesions 8024 and 1988      Past Medical History: Allergies: Allergies  Allergen Reactions   Quinapril Cough   Metformin And Related     Loose stool    Current Medications:  Current Outpatient Medications:    albuterol  (PROVENTIL  HFA;VENTOLIN  HFA) 108 (90 Base) MCG/ACT  inhaler, Inhale 2 puffs into the lungs every 4 (four) hours as needed for wheezing or shortness of breath., Disp: 18 g, Rfl: 2   amLODipine  (NORVASC ) 5 MG tablet, TAKE 1 TABLET BY MOUTH EVERY DAY, Disp: 90 tablet, Rfl: 1   ammonium lactate  (AMLACTIN) 12 % lotion, Apply 1 application topically as needed for dry skin., Disp: 400 g, Rfl: 5   aspirin  EC 81 MG EC tablet, Take 1 tablet (81 mg total) by mouth daily., Disp: 30 tablet, Rfl: 0   Biotin  5000 MCG TABS, Take 10,000 mcg by mouth daily. , Disp: , Rfl:    calcium -vitamin D  (OSCAL WITH D) 500-200 MG-UNIT tablet, Take 1 tablet by mouth daily., Disp: , Rfl:    Cholecalciferol (VITAMIN D3 PO), Take 2,000 Units by mouth daily., Disp: , Rfl:    clopidogrel  (PLAVIX ) 75 MG tablet, TAKE 1 TABLET BY MOUTH EVERY DAY, Disp: 90 tablet, Rfl: 1   Continuous Glucose  Receiver (FREESTYLE LIBRE 3 READER) DEVI, Use to monitor glucose continuously., Disp: 1 each, Rfl: 0   Continuous Glucose Sensor (FREESTYLE LIBRE 3 PLUS SENSOR) MISC, 1 each by Does not apply route every 14 (fourteen) days., Disp: 2 each, Rfl: 11   dapagliflozin  propanediol (FARXIGA ) 10 MG TABS tablet, Take 1 tablet (10 mg total) by mouth daily before breakfast., Disp: 90 tablet, Rfl: 3   furosemide  (LASIX ) 20 MG tablet, TAKE 1 TABLET BY MOUTH EVERY DAY, Disp: 90 tablet, Rfl: 1   glucose blood (FREESTYLE PRECISION NEO TEST) test strip, Use as instructed, Disp: 100 each, Rfl: 12   hydrALAZINE  (APRESOLINE ) 25 MG tablet, TAKE 1 TABLET BY MOUTH THREE TIMES A DAY, Disp: 270 tablet, Rfl: 1   losartan  (COZAAR ) 100 MG tablet, TAKE 1 TABLET BY MOUTH EVERY DAY, Disp: 90 tablet, Rfl: 1   metoprolol  succinate (TOPROL -XL) 100 MG 24 hr tablet, TAKE 1 TABLET BY MOUTH DAILY WITH OR IMMEDIATELY FOLLOWING A MEAL, Disp: 90 tablet, Rfl: 1   montelukast  (SINGULAIR ) 10 MG tablet, TAKE 1 TABLET BY MOUTH EVERY DAY, Disp: 90 tablet, Rfl: 1   mupirocin  ointment (BACTROBAN ) 2 %, APPLY TO AFFECTED AREA TWICE A DAY, Disp: 22 g, Rfl: 0   nystatin -triamcinolone  ointment (MYCOLOG), Apply topically as needed. (Patient taking differently: Apply 1 Application topically daily as needed (itching).), Disp: 30 g, Rfl: 4   OVER THE COUNTER MEDICATION, Take 1 tablet by mouth daily. Multivitamin centrum 1 daily, Disp: , Rfl:    rosuvastatin  (CRESTOR ) 20 MG tablet, Take 1 tablet (20 mg total) by mouth daily., Disp: 90 tablet, Rfl: 3   Semaglutide , 1 MG/DOSE, (OZEMPIC , 1 MG/DOSE,) 4 MG/3ML SOPN, Inject 1 mg into the skin once a week., Disp: 9 mL, Rfl: 3   tacrolimus  (PROTOPIC ) 0.1 % ointment, Apply topically 2 (two) times daily. (Patient taking differently: Apply 1 Application topically daily as needed (blisters).), Disp: 30 g, Rfl: 2  Past Medical Problems: Past Medical History:  Diagnosis Date   Ambulates with cane 11/29/2020   outside  of home   ASTHMA NOS W/ACUTE EXACERBATION 07/10/2010   Cervical dysplasia    DEPRESSION 03/16/2007   DIABETES MELLITUS, TYPE II 12/01/2006   dx 2004   Dysmenorrhea    Fibroid    History of blood product transfusion    took own blood that was banked in 1990's   HYPERLIPIDEMIA 12/01/2006   HYPERTENSION 07/07/2006   Macular edema    Neuropathy    fingertips and tose some numbness, right foot numb at times  Obesity    Osteopenia 05/2017   T score -1.5 FRAX 2.6% / 0.1%   Pneumonia    1990's and 2000 none since   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 issuse resolved   Retinal edema    gets steriod inj in the eyes     Sleep apnea    not needed since 100 pound weight loss 2019-2020   Splenomegaly    in college   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   left side pt states, left wide weak per pt   Stroke [facility]) 2012   right pontine cva with left hemiparesis    Past Surgical History: Past Surgical History:  Procedure Laterality Date   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   APPENDECTOMY     yrs ago per pt 0n 11-29-2020   BIOPSY THYROID   05/02/2011   Nonneoplastic goiter   BREAST BIOPSY     BREAST LUMPECTOMY WITH RADIOACTIVE SEED LOCALIZATION Left 07/25/2017   Procedure: LEFT BREAST LUMPECTOMY WITH RADIOACTIVE SEED LOCALIZATION;  Surgeon: Vernetta Berg, MD;  Location: [facility] OR;  Service: General;  Laterality: Left;   BREAST SURGERY     Reduction   cataracts Bilateral 2012   COLONOSCOPY  2021   COLPOSCOPY     DILATATION & CURETTAGE/HYSTEROSCOPY WITH MYOSURE N/A 12/02/2020   Procedure: DILATATION & CURETTAGE/HYSTEROSCOPY WITH MYOSURE;  Surgeon: Lavoie, Marie-Lyne, MD;  Location: [facility];  Service: Gynecology;  Laterality: N/A;   DILATION AND CURETTAGE OF UTERUS  1975   DUB   EYE SURGERY     Laser for vision at times both eyes done   GASTRIC ROUX-EN-Y N/A 08/12/2017   Procedure: LAPAROSCOPIC ROUX-EN-Y GASTRIC BYPASS WITH HIATAL HERNIA  REPAIR AND UPPER ENDOSCOPY;  Surgeon: [name], Herlene Righter, MD;  Location: [facility];  Service: General;  Laterality: N/A;   GYNECOLOGIC CRYOSURGERY     MYOMECTOMY     OVARIAN CYST REMOVAL     PELVIC LAPAROSCOPY     DL lysis of adhesions 8024 and 1988    Social History: Social History   Socioeconomic History   Marital status: Single    Spouse name: Not on file   Number of children: 2   Years of education: 16   Highest education level: Bachelor's degree (e.g., BA, AB, BS)  Occupational History   Occupation: TEACHER    Employer: [employer]  Tobacco Use   Smoking status: Never   Smokeless tobacco: Never  Vaping Use   Vaping status: Never Used  Substance and Sexual Activity   Alcohol  use: Yes    Alcohol /week: 1.0 standard drink of alcohol     Types: 1 Glasses of wine per week    Comment: occas   Drug use: Never   Sexual activity: Not Currently    Birth control/protection: Post-menopausal    Comment: older than 17, less than 5  Other Topics Concern   Not on file  Social History Narrative   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   Living with daughter      Right handed      4 steps inside of the home      Social Drivers of Health   Financial Resource Strain: Low Risk  (01/22/2024)   Overall Financial Resource Strain (CARDIA)    Difficulty of Paying Living Expenses: Not hard at all  Food Insecurity: No Food Insecurity (01/22/2024)  Hunger Vital Sign    Worried About Running Out of Food in the Last Year: Never true    Ran Out of Food in the Last Year: Never true  Transportation Needs: No Transportation Needs (01/22/2024)   PRAPARE - Administrator, Civil Service (Medical): No    Lack of Transportation (Non-Medical): No  Physical Activity: Insufficiently Active (01/22/2024)   Exercise Vital Sign    Days of Exercise per Week: 3 days    Minutes of Exercise per Session: 30 min  Stress: No Stress  Concern Present (01/22/2024)   [employer] of Occupational Health - Occupational Stress Questionnaire    Feeling of Stress: Not at all  Social Connections: Moderately Isolated (01/22/2024)   Social Connection and Isolation Panel    Frequency of Communication with Friends and Family: More than three times a week    Frequency of Social Gatherings with Friends and Family: More than three times a week    Attends Religious Services: More than 4 times per year    Active Member of [employer] or Organizations: No    Attends Banker Meetings: Never    Marital Status: Never married  Intimate Partner Violence: Not At Risk (01/22/2024)   Humiliation, Afraid, Rape, and Kick questionnaire    Fear of Current or Ex-Partner: No    Emotionally Abused: No    Physically Abused: No    Sexually Abused: No    Family History: Family History  Problem Relation Age of Onset   Cancer Maternal Grandmother        Colon Cancer   Asthma Maternal Grandmother    Colon cancer Maternal Grandmother    Diabetes Father    Heart disease Father    Hypertension Father    Hyperlipidemia Father    Stroke Father        passed at age 98 from stroke   Hypertension Mother    Heart disease Mother    Ovarian cancer Mother    Cancer Mother        Lung cancer   Asthma Mother    COPD Mother    Hyperlipidemia Mother    Diabetes Brother    Kidney disease Brother    Hypertension Brother    Graves' disease Sister    Diabetes Sister    Breast cancer Sister 29   Graves' disease Paternal Grandmother    Hypertension Paternal Grandmother    Heart disease Paternal Grandmother    Alzheimer's disease Paternal Grandmother    Cancer Maternal Grandfather    Heart failure Brother    Prostate cancer Brother    Diabetes Brother    Asthma Brother    Hypertension Brother    Heart failure Brother    Prostate cancer Brother    Stroke Brother    Hypertension Brother     Review of Systems: ROS  Physical Exam: Vital  Signs BP (!) 180/91 (BP Location: Left Arm, Patient Position: Sitting, Cuff Size: Normal) Comment: Pt just took HTN med before appt. I will recheck  Pulse 82   Wt 150 lb 12.8 oz (68.4 kg)   SpO2 98%   BMI 25.88 kg/m   Physical Exam RN as chaperone Constitutional:      General: Not in acute distress.    Appearance: Normal appearance. Not ill-appearing.  HENT:     Head: Normocephalic and atraumatic.  Eyes:     Pupils: Pupils are equal, round. Cardiovascular:     Hemodynamically normal Pulmonary:  Effort: No respiratory distress or increased work of breathing.  Speaks in full sentences. Abdominal:     General: No distension.   Skin:    General: Skin is warm and dry.     Findings: No erythema or rash.  Neurological:     Mental Status: Alert and oriented to person, place, and time.  Psychiatric:        Mood and Affect: Mood normal.        Behavior: Behavior normal.   Assessment/Plan: The patient is scheduled for panniculectomy with Dr. Waddell.  Risks, benefits, and alternatives of procedure discussed, questions answered and consent obtained.    Hypertensive today - Instructed to go to a walk in clinic/PCP today. The blood pressure needs to be controlled before surgery. This was discussed with Dr. Waddell.  Smoking Status: Non-smoker; Counseling Given? N/A  Caprini Score: 6; Risk Factors include: Age 67, BMI >25, Swollen legs and length of planned surgery. Recommendation for mechanical & pharmacological prophylaxis. Encourage early ambulation.   Pictures obtained: Initial consultation.  Post-op Rx sent to pharmacy:  [id number] [place], [place] - [address] [place] [address] Phone: [phone]  Fax: [phone]  Patient was provided with the General Surgical Risk consent document and Pain Medication Agreement prior to their appointment.  They had adequate time to read through the risk consent documents and Pain Medication Agreement. We  also discussed them in person together during this preop appointment. All of their questions were answered to their satisfaction.  Recommended calling if they have any further questions.  Risk consent form and Pain Medication Agreement to be scanned into patient's chart.  Patient will have to hold the following medications before surgery: - Plavix  for 3 days - Farxiga  5 days - Ozempic  7 days  Patient expressed understanding.    Electronically signed by: Shayana Hornstein M Akina Maish, MD 01/23/2024 9:59 AM

## 2024-01-23 NOTE — Progress Notes (Unsigned)
 Subjective:    Patient ID: Rebekah Kennedy, female    DOB: 11-02-1955, 68 y.o.   MRN: [id number]      HPI Channel is here for No chief complaint on file.   Elevated BP - yesterday BP 180/91 - asymptomatic     Medications and allergies reviewed with patient and updated if appropriate.  Current Outpatient Medications on File Prior to Visit  Medication Sig Dispense Refill   acetaminophen  (TYLENOL ) 500 MG tablet Take 1 tablet (500 mg total) by mouth every 6 (six) hours as needed. 30 tablet 0   albuterol  (PROVENTIL  HFA;VENTOLIN  HFA) 108 (90 Base) MCG/ACT inhaler Inhale 2 puffs into the lungs every 4 (four) hours as needed for wheezing or shortness of breath. 18 g 2   amLODipine  (NORVASC ) 5 MG tablet TAKE 1 TABLET BY MOUTH EVERY DAY 90 tablet 1   ammonium lactate  (AMLACTIN) 12 % lotion Apply 1 application topically as needed for dry skin. 400 g 5   aspirin  EC 81 MG EC tablet Take 1 tablet (81 mg total) by mouth daily. 30 tablet 0   Biotin  5000 MCG TABS Take 10,000 mcg by mouth daily.      calcium -vitamin D  (OSCAL WITH D) 500-200 MG-UNIT tablet Take 1 tablet by mouth daily.     Cholecalciferol (VITAMIN D3 PO) Take 2,000 Units by mouth daily.     clopidogrel  (PLAVIX ) 75 MG tablet TAKE 1 TABLET BY MOUTH EVERY DAY 90 tablet 1   Continuous Glucose Receiver (FREESTYLE LIBRE 3 READER) DEVI Use to monitor glucose continuously. 1 each 0   Continuous Glucose Sensor (FREESTYLE LIBRE 3 PLUS SENSOR) MISC 1 each by Does not apply route every 14 (fourteen) days. 2 each 11   dapagliflozin  propanediol (FARXIGA ) 10 MG TABS tablet Take 1 tablet (10 mg total) by mouth daily before breakfast. 90 tablet 3   furosemide  (LASIX ) 20 MG tablet TAKE 1 TABLET BY MOUTH EVERY DAY 90 tablet 1   glucose blood (FREESTYLE PRECISION NEO TEST) test strip Use as instructed 100 each 12   hydrALAZINE  (APRESOLINE ) 25 MG tablet TAKE 1 TABLET BY MOUTH THREE TIMES A DAY 270 tablet 1   losartan  (COZAAR ) 100 MG tablet TAKE 1  TABLET BY MOUTH EVERY DAY 90 tablet 1   metoprolol  succinate (TOPROL -XL) 100 MG 24 hr tablet TAKE 1 TABLET BY MOUTH DAILY WITH OR IMMEDIATELY FOLLOWING A MEAL 90 tablet 1   montelukast  (SINGULAIR ) 10 MG tablet TAKE 1 TABLET BY MOUTH EVERY DAY 90 tablet 1   mupirocin  ointment (BACTROBAN ) 2 % APPLY TO AFFECTED AREA TWICE A DAY 22 g 0   nystatin -triamcinolone  ointment (MYCOLOG) Apply topically as needed. (Patient taking differently: Apply 1 Application topically daily as needed (itching).) 30 g 4   ondansetron  (ZOFRAN -ODT) 4 MG disintegrating tablet Take 1 tablet (4 mg total) by mouth every 8 (eight) hours as needed for nausea or vomiting. 20 tablet 0   OVER THE COUNTER MEDICATION Take 1 tablet by mouth daily. Multivitamin centrum 1 daily     oxyCODONE  (ROXICODONE ) 5 MG immediate release tablet Take 1 tablet (5 mg total) by mouth every 6 (six) hours as needed for up to 3 days for severe pain (pain score 7-10). 12 tablet 0   rosuvastatin  (CRESTOR ) 20 MG tablet Take 1 tablet (20 mg total) by mouth daily. 90 tablet 3   Semaglutide , 1 MG/DOSE, (OZEMPIC , 1 MG/DOSE,) 4 MG/3ML SOPN Inject 1 mg into the skin once a week. 9 mL 3   tacrolimus  (  PROTOPIC ) 0.1 % ointment Apply topically 2 (two) times daily. (Patient taking differently: Apply 1 Application topically daily as needed (blisters).) 30 g 2   No current facility-administered medications on file prior to visit.    Review of Systems     Objective:  There were no vitals filed for this visit. BP Readings from Last 3 Encounters:  01/23/24 (!) 187/94  01/22/24 (!) 148/78  12/09/23 138/80   Wt Readings from Last 3 Encounters:  01/23/24 150 lb 12.8 oz (68.4 kg)  01/22/24 149 lb 12.8 oz (67.9 kg)  12/09/23 152 lb 3.2 oz (69 kg)   There is no height or weight on file to calculate BMI.    Physical Exam         Assessment & Plan:    See Problem List for Assessment and Plan of chronic medical problems.

## 2024-01-24 ENCOUNTER — Ambulatory Visit (INDEPENDENT_AMBULATORY_CARE_PROVIDER_SITE_OTHER): Admitting: Internal Medicine

## 2024-01-24 VITALS — BP 150/70 | HR 68 | Temp 98.0°F | Ht 64.0 in | Wt 150.0 lb

## 2024-01-24 DIAGNOSIS — Z23 Encounter for immunization: Secondary | ICD-10-CM | POA: Diagnosis not present

## 2024-01-24 DIAGNOSIS — I10 Essential (primary) hypertension: Secondary | ICD-10-CM

## 2024-01-24 DIAGNOSIS — N9089 Other specified noninflammatory disorders of vulva and perineum: Secondary | ICD-10-CM

## 2024-01-24 MED ORDER — NYSTATIN-TRIAMCINOLONE 100000-0.1 UNIT/GM-% EX OINT: TOPICAL_OINTMENT | CUTANEOUS | 4 refills | Status: AC | PRN

## 2024-01-24 MED ORDER — FUROSEMIDE 20 MG PO TABS: 20.0000 mg | ORAL_TABLET | Freq: Every day | ORAL | Status: DC | PRN

## 2024-01-24 MED ORDER — HYDRALAZINE HCL 50 MG PO TABS: 50.0000 mg | ORAL_TABLET | Freq: Three times a day (TID) | ORAL | Status: DC

## 2024-01-24 NOTE — Assessment & Plan Note (Signed)
 Chronic Not controlled She is only taking hydralazine  once a day-advised that she needs to be taking this 3 times a day Will also increase hydralazine  to 50 mg 3 times daily Continue amlodipine  5 mg daily, losartan  100 mg daily and metoprolol  XL 100 mg daily Advised that she needs to be monitoring her BP daily and update me with her readings next week Stressed the importance of getting her blood pressure well-controlled

## 2024-01-24 NOTE — Patient Instructions (Addendum)
    Prevnar 20 pneumonia given today.     Medications changes include :   increase hydralazine  50 mg three a day.  Continue your other medications   Monitor your BP daily at home and update me with your readings in one week.     Goal BP <  130/80

## 2024-01-27 ENCOUNTER — Encounter (HOSPITAL_BASED_OUTPATIENT_CLINIC_OR_DEPARTMENT_OTHER): Payer: Self-pay | Admitting: Plastic Surgery

## 2024-01-27 ENCOUNTER — Other Ambulatory Visit: Payer: Self-pay

## 2024-01-28 ENCOUNTER — Encounter (HOSPITAL_BASED_OUTPATIENT_CLINIC_OR_DEPARTMENT_OTHER)
Admission: RE | Admit: 2024-01-28 | Discharge: 2024-01-28 | Disposition: A | Discharge status: Home / Self Care | Source: Ambulatory Visit | Attending: Plastic Surgery | Admitting: Plastic Surgery

## 2024-01-28 DIAGNOSIS — Z01810 Encounter for preprocedural cardiovascular examination: Secondary | ICD-10-CM | POA: Diagnosis present

## 2024-01-28 DIAGNOSIS — R9431 Abnormal electrocardiogram [ECG] [EKG]: Secondary | ICD-10-CM | POA: Diagnosis not present

## 2024-01-28 DIAGNOSIS — Z01812 Encounter for preprocedural laboratory examination: Secondary | ICD-10-CM | POA: Diagnosis present

## 2024-01-28 DIAGNOSIS — Z01818 Encounter for other preprocedural examination: Secondary | ICD-10-CM | POA: Insufficient documentation

## 2024-01-28 LAB — BASIC METABOLIC PANEL WITH GFR
Anion gap: 12 (ref 5–15)
BUN: 14 mg/dL (ref 8–23)
CO2: 23 mmol/L (ref 22–32)
Calcium: 9.2 mg/dL (ref 8.9–10.3)
Chloride: 107 mmol/L (ref 98–111)
Creatinine, Ser: 0.79 mg/dL (ref 0.44–1.00)
GFR, Estimated: >60 mL/min (ref >60)
Glucose, Bld: 117 mg/dL — ABNORMAL HIGH (ref 70–99)
Potassium: 3.6 mmol/L (ref 3.5–5.1)
Sodium: 142 mmol/L (ref 135–145)

## 2024-01-31 ENCOUNTER — Telehealth: Payer: Self-pay | Admitting: Radiology

## 2024-01-31 ENCOUNTER — Encounter: Payer: Self-pay | Admitting: Internal Medicine

## 2024-01-31 MED ORDER — SPIRONOLACTONE 25 MG PO TABS: 25.0000 mg | ORAL_TABLET | Freq: Every day | ORAL | 3 refills | Status: AC

## 2024-01-31 NOTE — Telephone Encounter (Signed)
 Copied from CRM #[id number]. Topic: General - Other >> Jan 31, 2024  3:37 PM [place] A wrote: Reason for CRM: Patient would like to know is she still going to have her surgery on 02/03/24-please contact

## 2024-02-03 ENCOUNTER — Encounter (HOSPITAL_BASED_OUTPATIENT_CLINIC_OR_DEPARTMENT_OTHER)
Admission: RE | Disposition: A | Discharge status: Home / Self Care | Payer: Self-pay | Source: Home / Self Care | Attending: Plastic Surgery

## 2024-02-03 ENCOUNTER — Ambulatory Visit (HOSPITAL_BASED_OUTPATIENT_CLINIC_OR_DEPARTMENT_OTHER): Admitting: Certified Registered Nurse Anesthetist

## 2024-02-03 ENCOUNTER — Other Ambulatory Visit: Payer: Self-pay

## 2024-02-03 ENCOUNTER — Ambulatory Visit (HOSPITAL_BASED_OUTPATIENT_CLINIC_OR_DEPARTMENT_OTHER)
Admission: RE | Admit: 2024-02-03 | Discharge: 2024-02-03 | Disposition: A | Discharge status: Home / Self Care | Attending: Plastic Surgery | Admitting: Plastic Surgery

## 2024-02-03 ENCOUNTER — Encounter (HOSPITAL_BASED_OUTPATIENT_CLINIC_OR_DEPARTMENT_OTHER): Payer: Self-pay | Admitting: Plastic Surgery

## 2024-02-03 DIAGNOSIS — K219 Gastro-esophageal reflux disease without esophagitis: Secondary | ICD-10-CM | POA: Diagnosis not present

## 2024-02-03 DIAGNOSIS — E119 Type 2 diabetes mellitus without complications: Secondary | ICD-10-CM | POA: Diagnosis not present

## 2024-02-03 DIAGNOSIS — Z6825 Body mass index (BMI) 25.0-25.9, adult: Secondary | ICD-10-CM | POA: Diagnosis not present

## 2024-02-03 DIAGNOSIS — E785 Hyperlipidemia, unspecified: Secondary | ICD-10-CM | POA: Insufficient documentation

## 2024-02-03 DIAGNOSIS — G4730 Sleep apnea, unspecified: Secondary | ICD-10-CM | POA: Diagnosis not present

## 2024-02-03 DIAGNOSIS — E669 Obesity, unspecified: Secondary | ICD-10-CM | POA: Diagnosis not present

## 2024-02-03 DIAGNOSIS — Z9884 Bariatric surgery status: Secondary | ICD-10-CM | POA: Insufficient documentation

## 2024-02-03 DIAGNOSIS — R21 Rash and other nonspecific skin eruption: Secondary | ICD-10-CM

## 2024-02-03 DIAGNOSIS — G4733 Obstructive sleep apnea (adult) (pediatric): Secondary | ICD-10-CM

## 2024-02-03 DIAGNOSIS — I69854 Hemiplegia and hemiparesis following other cerebrovascular disease affecting left non-dominant side: Secondary | ICD-10-CM | POA: Diagnosis not present

## 2024-02-03 DIAGNOSIS — I10 Essential (primary) hypertension: Secondary | ICD-10-CM

## 2024-02-03 DIAGNOSIS — M793 Panniculitis, unspecified: Secondary | ICD-10-CM | POA: Insufficient documentation

## 2024-02-03 DIAGNOSIS — M8580 Other specified disorders of bone density and structure, unspecified site: Secondary | ICD-10-CM | POA: Diagnosis not present

## 2024-02-03 DIAGNOSIS — Z01818 Encounter for other preprocedural examination: Secondary | ICD-10-CM

## 2024-02-03 DIAGNOSIS — J4489 Other specified chronic obstructive pulmonary disease: Secondary | ICD-10-CM | POA: Diagnosis not present

## 2024-02-03 DIAGNOSIS — J449 Chronic obstructive pulmonary disease, unspecified: Secondary | ICD-10-CM | POA: Diagnosis not present

## 2024-02-03 DIAGNOSIS — Z7985 Long-term (current) use of injectable non-insulin antidiabetic drugs: Secondary | ICD-10-CM | POA: Insufficient documentation

## 2024-02-03 HISTORY — PX: PANNICULECTOMY: SHX5360

## 2024-02-03 LAB — GLUCOSE, CAPILLARY
Glucose-Capillary: 104 mg/dL — ABNORMAL HIGH (ref 70–99)
Glucose-Capillary: 109 mg/dL — ABNORMAL HIGH (ref 70–99)

## 2024-02-03 SURGERY — PANNICULECTOMY
Anesthesia: General | Site: Abdomen

## 2024-02-03 MED ORDER — MIDAZOLAM HCL 2 MG/2ML IJ SOLN
INTRAMUSCULAR | Status: AC
  Filled 2024-02-03: qty 2

## 2024-02-03 MED ORDER — CEFAZOLIN SODIUM-DEXTROSE 2-3 GM-%(50ML) IV SOLR
INTRAVENOUS | Status: DC | PRN
  Administered 2024-02-03: 2 g via INTRAVENOUS

## 2024-02-03 MED ORDER — HYDROMORPHONE HCL 1 MG/ML IJ SOLN
0.2500 mg | INTRAMUSCULAR | Status: DC | PRN
  Administered 2024-02-03 (×3): 0.25 mg via INTRAVENOUS

## 2024-02-03 MED ORDER — CEFAZOLIN SODIUM-DEXTROSE 2-4 GM/100ML-% IV SOLN: 2.0000 g | INTRAVENOUS | Status: DC

## 2024-02-03 MED ORDER — PROPOFOL 500 MG/50ML IV EMUL
INTRAVENOUS | Status: DC | PRN
  Administered 2024-02-03: 50 ug/kg/min via INTRAVENOUS

## 2024-02-03 MED ORDER — MIDAZOLAM HCL 2 MG/2ML IJ SOLN
INTRAMUSCULAR | Status: DC | PRN
  Administered 2024-02-03 (×2): 1 mg via INTRAVENOUS

## 2024-02-03 MED ORDER — ONDANSETRON HCL 4 MG/2ML IJ SOLN
INTRAMUSCULAR | Status: AC
  Filled 2024-02-03: qty 2

## 2024-02-03 MED ORDER — PROPOFOL 10 MG/ML IV BOLUS
INTRAVENOUS | Status: AC
  Filled 2024-02-03: qty 20

## 2024-02-03 MED ORDER — ONDANSETRON HCL 4 MG/2ML IJ SOLN
INTRAMUSCULAR | Status: DC | PRN
  Administered 2024-02-03: 4 mg via INTRAVENOUS

## 2024-02-03 MED ORDER — ACETAMINOPHEN 500 MG PO TABS
ORAL_TABLET | ORAL | Status: AC
  Filled 2024-02-03: qty 2

## 2024-02-03 MED ORDER — KETAMINE HCL 50 MG/5ML IJ SOSY
PREFILLED_SYRINGE | INTRAMUSCULAR | Status: AC
  Filled 2024-02-03: qty 5

## 2024-02-03 MED ORDER — MEPERIDINE HCL 25 MG/ML IJ SOLN: 6.2500 mg | INTRAMUSCULAR | Status: DC | PRN

## 2024-02-03 MED ORDER — LIDOCAINE 2% (20 MG/ML) 5 ML SYRINGE
INTRAMUSCULAR | Status: DC | PRN
  Administered 2024-02-03: 40 mg via INTRAVENOUS

## 2024-02-03 MED ORDER — LACTATED RINGERS IV SOLN: INTRAVENOUS | Status: DC

## 2024-02-03 MED ORDER — OXYCODONE HCL 5 MG PO TABS: 5.0000 mg | ORAL_TABLET | Freq: Once | ORAL | Status: DC | PRN

## 2024-02-03 MED ORDER — SODIUM CHLORIDE (PF) 0.9 % IJ SOLN
INTRAMUSCULAR | Status: DC | PRN
  Administered 2024-02-03: 100 mL

## 2024-02-03 MED ORDER — PROPOFOL 10 MG/ML IV BOLUS
INTRAVENOUS | Status: DC | PRN
  Administered 2024-02-03: 110 mg via INTRAVENOUS

## 2024-02-03 MED ORDER — BUPIVACAINE LIPOSOME 1.3 % IJ SUSP
INTRAMUSCULAR | Status: AC
Start: 2024-02-03 — End: 2024-02-03
  Filled 2024-02-03: qty 20

## 2024-02-03 MED ORDER — 0.9 % SODIUM CHLORIDE (POUR BTL) OPTIME
TOPICAL | Status: DC | PRN
  Administered 2024-02-03: 1000 mL

## 2024-02-03 MED ORDER — AMISULPRIDE (ANTIEMETIC) 5 MG/2ML IV SOLN: 10.0000 mg | Freq: Once | INTRAVENOUS | Status: DC | PRN

## 2024-02-03 MED ORDER — DEXAMETHASONE SODIUM PHOSPHATE 10 MG/ML IJ SOLN
INTRAMUSCULAR | Status: DC | PRN
  Administered 2024-02-03: 10 mg via INTRAVENOUS

## 2024-02-03 MED ORDER — CHLORHEXIDINE GLUCONATE CLOTH 2 % EX PADS: 6.0000 | MEDICATED_PAD | Freq: Once | CUTANEOUS | Status: DC

## 2024-02-03 MED ORDER — CEFAZOLIN SODIUM 1 G IJ SOLR
INTRAMUSCULAR | Status: AC
  Filled 2024-02-03: qty 20

## 2024-02-03 MED ORDER — DEXMEDETOMIDINE HCL IN NACL 80 MCG/20ML IV SOLN
INTRAVENOUS | Status: DC | PRN
Start: 2024-02-03 — End: 2024-02-03
  Administered 2024-02-03: 4 ug via INTRAVENOUS

## 2024-02-03 MED ORDER — PROPOFOL 500 MG/50ML IV EMUL
INTRAVENOUS | Status: AC
  Filled 2024-02-03: qty 50

## 2024-02-03 MED ORDER — BUPIVACAINE HCL (PF) 0.25 % IJ SOLN
INTRAMUSCULAR | Status: AC
  Filled 2024-02-03: qty 30

## 2024-02-03 MED ORDER — KETAMINE HCL 50 MG/5ML IJ SOSY
PREFILLED_SYRINGE | INTRAMUSCULAR | Status: DC | PRN
Start: 2024-02-03 — End: 2024-02-03
  Administered 2024-02-03: 20 mg via INTRAVENOUS
  Administered 2024-02-03: 30 mg via INTRAVENOUS

## 2024-02-03 MED ORDER — OXYCODONE HCL 5 MG/5ML PO SOLN: 5.0000 mg | Freq: Once | ORAL | Status: DC | PRN

## 2024-02-03 MED ORDER — DEXMEDETOMIDINE HCL IN NACL 80 MCG/20ML IV SOLN
INTRAVENOUS | Status: AC
  Filled 2024-02-03: qty 20

## 2024-02-03 MED ORDER — SCOPOLAMINE 1 MG/3DAYS TD PT72: 1.0000 | MEDICATED_PATCH | TRANSDERMAL | Status: DC

## 2024-02-03 MED ORDER — MIDAZOLAM HCL 2 MG/2ML IJ SOLN: 0.5000 mg | Freq: Once | INTRAMUSCULAR | Status: DC | PRN

## 2024-02-03 MED ORDER — ALBUMIN HUMAN 5 % IV SOLN: INTRAVENOUS | Status: DC | PRN

## 2024-02-03 MED ORDER — FENTANYL CITRATE (PF) 100 MCG/2ML IJ SOLN
INTRAMUSCULAR | Status: AC
  Filled 2024-02-03: qty 2

## 2024-02-03 MED ORDER — ROCURONIUM BROMIDE 10 MG/ML (PF) SYRINGE
PREFILLED_SYRINGE | INTRAVENOUS | Status: DC | PRN
  Administered 2024-02-03: 60 mg via INTRAVENOUS

## 2024-02-03 MED ORDER — CEFAZOLIN SODIUM-DEXTROSE 2-4 GM/100ML-% IV SOLN
INTRAVENOUS | Status: AC
  Filled 2024-02-03: qty 100

## 2024-02-03 MED ORDER — SUGAMMADEX SODIUM 200 MG/2ML IV SOLN
INTRAVENOUS | Status: DC | PRN
  Administered 2024-02-03: 160 mg via INTRAVENOUS

## 2024-02-03 MED ORDER — FENTANYL CITRATE (PF) 100 MCG/2ML IJ SOLN
INTRAMUSCULAR | Status: DC | PRN
  Administered 2024-02-03: 100 ug via INTRAVENOUS

## 2024-02-03 MED ORDER — HYDROMORPHONE HCL 1 MG/ML IJ SOLN
INTRAMUSCULAR | Status: AC
  Filled 2024-02-03: qty 0.5

## 2024-02-03 MED ORDER — ACETAMINOPHEN 500 MG PO TABS
1000.0000 mg | ORAL_TABLET | Freq: Once | ORAL | Status: AC
  Administered 2024-02-03: 1000 mg via ORAL

## 2024-02-03 MED ORDER — EPHEDRINE 5 MG/ML INJ
INTRAVENOUS | Status: AC
Start: 2024-02-03 — End: 2024-02-03
  Filled 2024-02-03: qty 5

## 2024-02-03 MED ORDER — EPHEDRINE SULFATE-NACL 50-0.9 MG/10ML-% IV SOSY
PREFILLED_SYRINGE | INTRAVENOUS | Status: DC | PRN
  Administered 2024-02-03: 5 mg via INTRAVENOUS

## 2024-02-03 MED ORDER — SODIUM CHLORIDE (PF) 0.9 % IJ SOLN
INTRAMUSCULAR | Status: AC
Start: 2024-02-03 — End: 2024-02-03
  Filled 2024-02-03: qty 50

## 2024-02-03 SURGICAL SUPPLY — 57 items
APPLICATOR COTTON TIP 6 STRL (MISCELLANEOUS) IMPLANT
BINDER ABDOMINAL 12 ML 46-62 (SOFTGOODS) IMPLANT
BINDER ABDOMINAL 12 SM 30-45 (SOFTGOODS) IMPLANT
BINDER ABDOMINAL 9 SM 30-45 (SOFTGOODS) IMPLANT
BIOPATCH RED 1 DISK 7.0 (GAUZE/BANDAGES/DRESSINGS) ×2 IMPLANT
BLADE CLIPPER SURG (BLADE) IMPLANT
BLADE SURG 10 STRL SS (BLADE) ×4 IMPLANT
BLADE SURG 11 STRL SS (BLADE) IMPLANT
BLADE SURG 15 STRL LF DISP TIS (BLADE) ×1 IMPLANT
CLIP APPLIE 9.375 MED OPEN (MISCELLANEOUS) IMPLANT
DERMABOND ADVANCED .7 DNX12 (GAUZE/BANDAGES/DRESSINGS) ×2 IMPLANT
DRAIN CHANNEL 19F RND (DRAIN) ×2 IMPLANT
DRAPE UTILITY XL STRL (DRAPES) ×1 IMPLANT
DRSG TEGADERM 4X4.75 (GAUZE/BANDAGES/DRESSINGS) ×2 IMPLANT
ELECTRODE BLDE 4.0 EZ CLN MEGD (MISCELLANEOUS) ×1 IMPLANT
ELECTRODE REM PT RTRN 9FT ADLT (ELECTROSURGICAL) ×2 IMPLANT
EVACUATOR SILICONE 100CC (DRAIN) ×2 IMPLANT
GAUZE PAD ABD 8X10 STRL (GAUZE/BANDAGES/DRESSINGS) ×2 IMPLANT
GAUZE SPONGE 2X2 STRL 8-PLY (GAUZE/BANDAGES/DRESSINGS) ×2 IMPLANT
GLOVE BIO SURGEON STRL SZ 6.5 (GLOVE) IMPLANT
GLOVE BIO SURGEON STRL SZ7.5 (GLOVE) IMPLANT
GLOVE BIO SURGEON STRL SZ8 (GLOVE) ×1 IMPLANT
GLOVE BIOGEL PI IND STRL 7.0 (GLOVE) IMPLANT
GLOVE BIOGEL PI IND STRL 7.5 (GLOVE) IMPLANT
GLOVE BIOGEL PI IND STRL 8 (GLOVE) ×1 IMPLANT
GLOVE SURG SS PI 7.0 STRL IVOR (GLOVE) IMPLANT
GOWN STRL REUS W/ TWL LRG LVL3 (GOWN DISPOSABLE) ×1 IMPLANT
GOWN STRL REUS W/TWL XL LVL3 (GOWN DISPOSABLE) ×1 IMPLANT
HEMOSTAT ARISTA ABSORB 3G PWDR (HEMOSTASIS) IMPLANT
HIBICLENS CHG 4% 4OZ BTL (MISCELLANEOUS) ×1 IMPLANT
MANIFOLD NEPTUNE II (INSTRUMENTS) ×1 IMPLANT
NDL HYPO 22X1.5 SAFETY MO (MISCELLANEOUS) ×2 IMPLANT
NEEDLE HYPO 22X1.5 SAFETY MO (MISCELLANEOUS) ×2 IMPLANT
NS IRRIG 1000ML POUR BTL (IV SOLUTION) ×1 IMPLANT
PACK BASIN DAY SURGERY FS (CUSTOM PROCEDURE TRAY) ×1 IMPLANT
PACK UNIVERSAL I (CUSTOM PROCEDURE TRAY) ×1 IMPLANT
PENCIL SMOKE EVACUATOR (MISCELLANEOUS) ×2 IMPLANT
PIN SAFETY STERILE (MISCELLANEOUS) ×1 IMPLANT
SLEEVE SCD COMPRESS KNEE MED (STOCKING) ×1 IMPLANT
SPONGE T-LAP 18X18 ~~LOC~~+RFID (SPONGE) ×2 IMPLANT
STAPLER INSORB 30 2030 C-SECTI (MISCELLANEOUS) IMPLANT
STAPLER SKIN PROX WIDE 3.9 (STAPLE) ×1 IMPLANT
SUT MNCRL AB 3-0 PS2 27 (SUTURE) ×4 IMPLANT
SUT MNCRL AB 4-0 PS2 18 (SUTURE) ×2 IMPLANT
SUT SILK 2 0 PERMA HAND 18 BK (SUTURE) ×2 IMPLANT
SUT VIC AB 2-0 CT1 TAPERPNT 27 (SUTURE) ×4 IMPLANT
SUT VIC AB 3-0 PS1 18XBRD (SUTURE) IMPLANT
SUT VIC AB 3-0 SH 27X BRD (SUTURE) ×1 IMPLANT
SUT VICRYL AB 3 0 TIES (SUTURE) IMPLANT
SYR 20ML LL LF (SYRINGE) ×2 IMPLANT
SYR BULB IRRIG 60ML STRL (SYRINGE) IMPLANT
SYR CONTROL 10ML LL (SYRINGE) ×1 IMPLANT
TOWEL GREEN STERILE FF (TOWEL DISPOSABLE) ×2 IMPLANT
TRAY DSU PREP LF (CUSTOM PROCEDURE TRAY) ×1 IMPLANT
TUBE CONNECTING 20X1/4 (TUBING) ×1 IMPLANT
UNDERPAD 30X36 HEAVY ABSORB (UNDERPADS AND DIAPERS) ×2 IMPLANT
YANKAUER SUCT BULB TIP NO VENT (SUCTIONS) ×1 IMPLANT

## 2024-02-03 NOTE — Interval H&P Note (Signed)
 History and Physical Interval Note: No change in exam or indication for surgery All questions answered Marked for a panniculectomy with her concurrence Will proceed at her request  02/03/2024 12:38 PM  Rebekah Kennedy  has presented today for surgery, with the diagnosis of m79.3.  The various methods of treatment have been discussed with the patient and family. After consideration of risks, benefits and other options for treatment, the patient has consented to  Procedure(s): PANNICULECTOMY (N/A) as a surgical intervention.  The patient's history has been reviewed, patient examined, no change in status, stable for surgery.  I have reviewed the patient's chart and labs.  Questions were answered to the patient's satisfaction.     [name]

## 2024-02-03 NOTE — Anesthesia Procedure Notes (Signed)
 Procedure Name: Intubation Date/Time: 02/03/2024 1:10 PM  Performed by: Leotha Andrez DEL, CRNAPre-anesthesia Checklist: Patient identified, Emergency Drugs available, Suction available, Patient being monitored and Timeout performed Patient Re-evaluated:Patient Re-evaluated prior to induction Oxygen Delivery Method: Circle system utilized Preoxygenation: Pre-oxygenation with 100% oxygen Induction Type: IV induction Ventilation: Oral airway inserted - appropriate to patient size and Mask ventilation without difficulty Laryngoscope Size: Mac and 4 Grade View: Grade I Tube type: Oral Tube size: 7.0 mm Number of attempts: 1 Airway Equipment and Method: Stylet Placement Confirmation: ETT inserted through vocal cords under direct vision, positive ETCO2 and breath sounds checked- equal and bilateral Secured at: 22 cm Tube secured with: Tape Dental Injury: Teeth and Oropharynx as per pre-operative assessment

## 2024-02-03 NOTE — Op Note (Signed)
 DATE OF OPERATION: 02/03/2024  LOCATION: [facility] operating Room  PREOPERATIVE DIAGNOSIS: Panniculitis  POSTOPERATIVE DIAGNOSIS: Same  PROCEDURE: Panniculectomy  SURGEON: Marinell Birmingham, MD  ASSISTANT: Estefana Peck  EBL: 100 cc  CONDITION: Stable  COMPLICATIONS: None  INDICATION: The patient, Rebekah Kennedy, is a 68 y.o. female born on Jun 12, 1955, is here for treatment of ongoing rashes on the posterior aspect of the pannus and intertriginous regions.   PROCEDURE DETAILS:  The patient was seen prior to surgery and marked.  The IV antibiotics were given. The patient was taken to the operating room and given a general anesthetic. A standard time out was performed and all information was confirmed by those in the room. SCDs were placed.   The abdomen was prepped and draped in usual sterile manner.  A transverse incision was made across the lower portion of the abdomen and dissection carried out through the subcutaneous tissues to the anterior abdominal wall.  The skin and fat was elevated from the anterior abdominal wall in a caudad to cranial manner to the level of the umbilicus.  An appropriate point for the counterincision was identified and the skin was incised sharply.  The pannus was removed without difficulty.  The surgical bed was inspected for bleeding and meticulous hemostasis was achieved with the electrocautery and suture ligation.  100 mL of a mixture of Exparel , quarter percent plain Marcaine , saline was injected into the subfascial space over the rectus muscles and the external oblique as well as the subcutaneous tissues.  The surgical bed was irrigated with warm normal saline.  The patient was then given a Valsalva and held for 5 seconds to ensure there was no additional bleeding.  219 French round drains were placed in the surgical bed and brought out through separate stab incisions.  The surgical surfaces were then coated with Arista hemostatic agent and the skin edges  tailor tacked in place with skin clips.  The Scarpa's fascia was approximated with interrupted 2-0 Vicryl sutures.  The dermis was closed with interrupted and running 3-0 Monocryl sutures and the skin was closed with a running 4-0 Monocryl subcuticular stitch.  The incision was sealed with Dermabond and the patient was placed in a compressive garment.  She was awakened from anesthesia without incident transferred to the recovery room in good condition.  All instrument needle and sponge counts reported as correct and no complications were appreciated during the procedure. The patient was allowed to wake up and taken to recovery room in stable condition at the end of the case. The family was notified at the end of the case.   The advanced practice practitioner (APP) assisted throughout the case.  The APP was essential in retraction and counter traction when needed to make the case progress smoothly.  This retraction and assistance made it possible to see the tissue plans for the procedure.  The assistance was needed for blood control, tissue re-approximation and assisted with closure of the incision site.

## 2024-02-03 NOTE — Discharge Instructions (Addendum)
 INSTRUCTIONS FOR AFTER ABDOMINAL SURGERY   You will likely have some questions about what to expect following your operation.  The following information will help you and your family understand what to expect when you are discharged from the hospital.  It is important to follow these guidelines to help ensure a smooth recovery and reduce complication.  Postoperative instructions include information on: diet, wound care, medications and physical activity.  AFTER SURGERY Expect to go home after the procedure.  In some cases, you may need to spend one night in the hospital for observation.  DIET Abdominal surgery does not require a specific diet.  However, the healthier you eat the better your body will heal. It is important to increasing your protein intake.  This means limiting the foods with sugar and carbohydrates.  Focus on vegetables and some meat.  If you have liposuction during your procedure be sure to drink water.  If your urine is bright yellow, then it is concentrated, and you need to drink more water.  As a general rule after surgery, you should have 8 ounces of water every hour while awake.  If you find you are persistently nauseated or unable to take in liquids let us  know.  NO TOBACCO USE or EXPOSURE.  This will slow your healing process and lead to a wound.  WOUND CARE Leave the binder on at all times except when showering . Use fragrance free soap like Dial, Dove or [place].   After 24 hours you can remove the binder to shower. Once dry apply binder or compressive spanx. No baths, pools or hot tubs for four weeks. We close your incision to leave the smallest and best-looking scar. No ointment or creams on your incisions for four weeks.  No Neosporin (Too many skin reactions).  A few weeks after surgery you can use Mederma and start massaging the scar. We ask you to wear your binder or compressive spanx for the first 6 weeks around the clock, including while sleeping. This provides added  comfort and helps reduce the fluid accumulation at the surgery site. NO Ice or heating pads to the operative site.  You have a very high risk of a BURN before you feel the temperature change. Continue to empty, recharge, & record drainage from drains 2-3 times a day, as needed.  ACTIVITY No heavy lifting until cleared by the doctor.  This usually means no more than a half-gallon of milk.  It is OK to walk and climb stairs. Moving your legs is very important to decrease your risk of a blood clot.  It will also help keep you from getting deconditioned.  Every 1 to 2 hours get up and walk for 5 minutes. This will help with a quicker recovery back to normal.  Let pain be your guide so you don't do too much.  This time is for you to recover.  You will be more comfortable if you sleep and rest with your head elevated either with a few pillows under you or in a recliner.  No stomach sleeping for a three months.  WORK Everyone returns to work at different times. As a rough guide, most people take at least 1 - 2 weeks off prior to returning to work. If you need documentation for your job, give the forms to the front staff at the clinic.  DRIVING Arrange for someone to bring you home from the hospital after your surgery.  You may be able to drive a few days after  surgery but not while taking any narcotics or valium .  BOWEL MOVEMENTS Constipation can occur after anesthesia and while taking pain medication.  It is important to stay ahead for your comfort.  We recommend taking Milk of Magnesia (2 tablespoons; twice a day) while taking the pain pills.  MEDICATIONS You may be prescribed should start after surgery At your preoperative visit for you history and physical you may have been given the following medications: Zofran  4 mg:  This is to treat nausea and vomiting.  You can take this every 6 hours as needed and only if needed. 2.   Oxycodone  5 mg:  This is only to be used after you have taken the Motrin  or  the Tylenol . Every 8 hours as needed.   Over the counter Medication to take: Ibuprofen  (Motrin ) 600 mg:  Take this every 6 hours.  If you have additional pain then take 500 mg of the Tylenol  every 8 hours.  Only take the Norco after you have tried these two. MiraLAX  or Milk of Magnesia: Take this according to the bottle if you take the Norco.  WHEN TO CALL Call your surgeon's office if any of the following occur: Fever 101 degrees F or greater Excessive bleeding or fluid from the incision site. Pain that increases over time without aid from the medications Redness, warmth, or pus draining from incision sites Persistent nausea or inability to take in liquids Severe misshapen area that underwent the operation.    Post Anesthesia Home Care Instructions  Activity: Get plenty of rest for the remainder of the day. A responsible individual must stay with you for 24 hours following the procedure.  For the next 24 hours, DO NOT: -Drive a car -Advertising copywriter -Drink alcoholic beverages -Take any medication unless instructed by your physician -Make any legal decisions or sign important papers.  Meals: Start with liquid foods such as gelatin or soup. Progress to regular foods as tolerated. Avoid greasy, spicy, heavy foods. If nausea and/or vomiting occur, drink only clear liquids until the nausea and/or vomiting subsides. Call your physician if vomiting continues.  Special Instructions/Symptoms: Your throat may feel dry or sore from the anesthesia or the breathing tube placed in your throat during surgery. If this causes discomfort, gargle with warm salt water. The discomfort should disappear within 24 hours.  If you had a scopolamine  patch placed behind your ear for the management of post- operative nausea and/or vomiting:  1. The medication in the patch is effective for 72 hours, after which it should be removed.  Wrap patch in a tissue and discard in the trash. Wash hands thoroughly with  soap and water. 2. You may remove the patch earlier than 72 hours if you experience unpleasant side effects which may include dry mouth, dizziness or visual disturbances. 3. Avoid touching the patch. Wash your hands with soap and water after contact with the patch.     Information for Discharge Teaching: EXPAREL  (bupivacaine  liposome injectable suspension)   Pain relief is important to your recovery. The goal is to control your pain so you can move easier and return to your normal activities as soon as possible after your procedure. Your physician may use several types of medicines to manage pain, swelling, and more.  Your surgeon or anesthesiologist gave you EXPAREL (bupivacaine ) to help control your pain after surgery.  EXPAREL  is a local anesthetic designed to release slowly over an extended period of time to provide pain relief by numbing the tissue around the surgical  site. EXPAREL  is designed to release pain medication over time and can control pain for up to 72 hours. Depending on how you respond to EXPAREL , you may require less pain medication during your recovery. EXPAREL  can help reduce or eliminate the need for opioids during the first few days after surgery when pain relief is needed the most. EXPAREL  is not an opioid and is not addictive. It does not cause sleepiness or sedation.   Important! A teal colored band has been placed on your arm with the date, time and amount of EXPAREL  you have received. Please leave this armband in place for the full 96 hours following administration, and then you may remove the band. If you return to the hospital for any reason within 96 hours following the administration of EXPAREL , the armband provides important information that your health care providers to know, and alerts them that you have received this anesthetic.    Possible side effects of EXPAREL : Temporary loss of sensation or ability to move in the area where medication was  injected. Nausea, vomiting, constipation Rarely, numbness and tingling in your mouth or lips, lightheadedness, or anxiety may occur. Call your doctor right away if you think you may be experiencing any of these sensations, or if you have other questions regarding possible side effects.  Follow all other discharge instructions given to you by your surgeon or nurse. Eat a healthy diet and drink plenty of water or other fluids.  About my Jackson-Pratt Bulb Drain  What is a Jackson-Pratt bulb? A Jackson-Pratt is a soft, round device used to collect drainage. It is connected to a long, thin drainage catheter, which is held in place by one or two small stiches near your surgical incision site. When the bulb is squeezed, it forms a vacuum, forcing the drainage to empty into the bulb.  Emptying the Jackson-Pratt bulb- To empty the bulb: 1. Release the plug on the top of the bulb. 2. Pour the bulb's contents into a measuring container which your nurse will provide. 3. Record the time emptied and amount of drainage. Empty the drain(s) as often as your     doctor or nurse recommends.  Date                  Time                    Amount (Drain 1)                 Amount (Drain 2)  _____________________________________________________________________  _____________________________________________________________________  _____________________________________________________________________  _____________________________________________________________________  _____________________________________________________________________  _____________________________________________________________________  _____________________________________________________________________  _____________________________________________________________________  Squeezing the Jackson-Pratt Bulb- To squeeze the bulb: 1. Make sure the plug at the top of the bulb is open. 2. Squeeze the bulb tightly in your fist. You  will hear air squeezing from the bulb. 3. Replace the plug while the bulb is squeezed. 4. Use a safety pin to attach the bulb to your clothing. This will keep the catheter from     pulling at the bulb insertion site.  When to call your doctor- Call your doctor if: Drain site becomes red, swollen or hot. You have a fever greater than 101 degrees F. There is oozing at the drain site. Drain falls out (apply a guaze bandage over the drain hole and secure it with tape). Drainage increases daily not related to activity patterns. (You will usually have more drainage when you are active than when you are resting.) Drainage has a  bad odor.   No tylenol  until after 6pm

## 2024-02-03 NOTE — Anesthesia Postprocedure Evaluation (Signed)
 Anesthesia Post Note  Patient: Rebekah Kennedy  Procedure(s) Performed: PANNICULECTOMY (Abdomen)     Patient location during evaluation: Phase II Anesthesia Type: General Level of consciousness: awake and alert, oriented and patient cooperative Pain management: pain level controlled Vital Signs Assessment: post-procedure vital signs reviewed and stable Respiratory status: spontaneous breathing, nonlabored ventilation and respiratory function stable Cardiovascular status: blood pressure returned to baseline and stable Postop Assessment: no apparent nausea or vomiting and able to ambulate Anesthetic complications: no   No notable events documented.  Last Vitals:  Vitals:   02/03/24 1600 02/03/24 1612  BP: (!) 147/80 (!) 151/79  Pulse: 71 78  Resp: 10 16  Temp:    SpO2: 100% 98%    Last Pain:  Vitals:   02/03/24 1606  TempSrc:   PainSc: 7                  Jkwon Treptow,E. Amman Bartel

## 2024-02-03 NOTE — Anesthesia Preprocedure Evaluation (Addendum)
 Anesthesia Evaluation  Patient identified by MRN, date of birth, ID band Patient awake    Reviewed: Allergy & Precautions, NPO status , Patient's Chart, lab work & pertinent test results  History of Anesthesia Complications Negative for: history of anesthetic complications  Airway Mallampati: I  TM Distance: >3 FB Neck ROM: Full    Dental  (+) Dental Advisory Given   Pulmonary asthma , sleep apnea (does not require CPAP) , COPD,  COPD inhaler   breath sounds clear to auscultation       Cardiovascular hypertension, Pt. on medications and Pt. on home beta blockers (-) angina + Peripheral Vascular Disease   Rhythm:Regular Rate:Normal  '20 ECHO: EF 60-65%.  1. The cavity size was normal. There is mild concentric LVH. No evidence of  left ventricular regional wall motion abnormalities.   2. No intracardiac thrombi or masses were visualized.   3. The RV has normal systolic function. The cavity was normal. There is no increase in right ventricular wall thickness.   4. The mitral valve is normal in structure.   5. The tricuspid valve is normal in structure.   6. The aortic valve is tricuspid Mild thickening of the aortic valve Mild calcification of the aortic valve.     Neuro/Psych CVA (L sided weakness), Residual Symptoms    GI/Hepatic Neg liver ROS,GERD  Medicated and Controlled,,S/p gastric Roux-en-Y   Endo/Other  diabetes (glu 104)  Semaglutide : 8d ago   Renal/GU negative Renal ROS     Musculoskeletal   Abdominal   Peds  Hematology plavix    Anesthesia Other Findings   Reproductive/Obstetrics                              Anesthesia Physical Anesthesia Plan  ASA: 3  Anesthesia Plan: General   Post-op Pain Management: Tylenol  PO (pre-op)*   Induction: Intravenous  PONV Risk Score and Plan: 3 and Ondansetron , Dexamethasone  and Scopolamine  patch - Pre-op  Airway Management Planned:  Oral ETT  Additional Equipment: None  Intra-op Plan:   Post-operative Plan: Extubation in OR  Informed Consent: I have reviewed the patients History and Physical, chart, labs and discussed the procedure including the risks, benefits and alternatives for the proposed anesthesia with the patient or authorized representative who has indicated his/her understanding and acceptance.     Dental advisory given  Plan Discussed with: CRNA and Surgeon  Anesthesia Plan Comments:          Anesthesia Quick Evaluation

## 2024-02-03 NOTE — Transfer of Care (Signed)
 Immediate Anesthesia Transfer of Care Note  Patient: Rebekah Kennedy  Procedure(s) Performed: PANNICULECTOMY (Abdomen)  Patient Location: PACU  Anesthesia Type:General  Level of Consciousness: drowsy and patient cooperative  Airway & Oxygen Therapy: Patient Spontanous Breathing and Patient connected to face mask oxygen  Post-op Assessment: Report given to RN and Post -op Vital signs reviewed and stable  Post vital signs: Reviewed and stable  Last Vitals:  Vitals Value Taken Time  BP 164/86 02/03/24 15:40  Temp 36.7 C 02/03/24 15:40  Pulse 78 02/03/24 15:42  Resp 21 02/03/24 15:42  SpO2 100 % 02/03/24 15:42  Vitals shown include unfiled device data.  Last Pain:  Vitals:   02/03/24 1202  TempSrc: Temporal  PainSc: 0-No pain         Complications: No notable events documented.

## 2024-02-04 ENCOUNTER — Encounter (HOSPITAL_BASED_OUTPATIENT_CLINIC_OR_DEPARTMENT_OTHER): Payer: Self-pay | Admitting: Plastic Surgery

## 2024-02-04 NOTE — Telephone Encounter (Signed)
 Patient had surgery yesterday as planned.

## 2024-02-10 ENCOUNTER — Encounter: Payer: Self-pay | Admitting: Student

## 2024-02-10 ENCOUNTER — Ambulatory Visit (INDEPENDENT_AMBULATORY_CARE_PROVIDER_SITE_OTHER): Admitting: Student

## 2024-02-10 VITALS — BP 167/65 | HR 82 | Ht 63.0 in | Wt 147.2 lb

## 2024-02-10 DIAGNOSIS — M793 Panniculitis, unspecified: Secondary | ICD-10-CM

## 2024-02-10 NOTE — Progress Notes (Signed)
 Patient is a 68 year old female who underwent panniculectomy with Dr. Waddell on 02/03/2024.  Patient is 1 week postop.  She presents to the clinic today for postoperative follow-up.  Today, patient reports she is overall doing well.  She states that she has been a little bit sore, but the pain has been tolerable with medications.  She denies any fevers or chills.  She reports that she has been ambulating without any difficulty.  She denies any nausea or vomiting.  She reports she has been eating and drinking without issue.  She reports she has been having bowel movements.  Patient reports that her left drain has been putting out 15 to 25 cc/day for the past few days.  Her right drain has had a little bit of higher output, closer to 40 to 50 cc/day for the past few days.  Chaperone present on exam.  On exam, abdomen is soft and nontender.  There is no overlying erythema.  No obvious fluid collection on exam.  There is very minimal bruising noted.  Incision is clean dry and intact.  JP drains are in place and functioning with serosanguineous drainage in the bulbs.  There are no signs of infection on exam.  Left JP drain was removed without any difficulty.  Patient tolerated well.  Instructed the patient to continue with compression at all times and avoid vigorous activities.  Discussed with her that her left JP drain site might drain a little bit over the next few days.  Recommended she apply gauze and tape over the area.  I discussed with her that starting later this week, she may start applying Vaseline to the drain site.  She expressed understanding.  Patient to follow back up next week.  I instructed her to call if she has any questions or concerns about anything.

## 2024-02-13 NOTE — Progress Notes (Unsigned)
 Subjective:    Patient ID: Rebekah Kennedy, female    DOB: 15-Oct-1955, 68 y.o.   MRN: [id number]     HPI Rebekah Kennedy is here for follow up of her chronic medical problems -- hypertension.  3 weeks ago I inc'd her hydralazine  to 50 mg three times a day.  She is checking her BP at home daily.  S/p panniculectomy on 9/15  Medications and allergies reviewed with patient and updated if appropriate.  Current Outpatient Medications on File Prior to Visit  Medication Sig Dispense Refill   acetaminophen  (TYLENOL ) 500 MG tablet Take 1 tablet (500 mg total) by mouth every 6 (six) hours as needed. 30 tablet 0   albuterol  (PROVENTIL  HFA;VENTOLIN  HFA) 108 (90 Base) MCG/ACT inhaler Inhale 2 puffs into the lungs every 4 (four) hours as needed for wheezing or shortness of breath. 18 g 2   amLODipine  (NORVASC ) 5 MG tablet TAKE 1 TABLET BY MOUTH EVERY DAY 90 tablet 1   aspirin  EC 81 MG EC tablet Take 1 tablet (81 mg total) by mouth daily. 30 tablet 0   Biotin  5000 MCG TABS Take 10,000 mcg by mouth daily.      Cholecalciferol (VITAMIN D3 PO) Take 2,000 Units by mouth daily.     clopidogrel  (PLAVIX ) 75 MG tablet TAKE 1 TABLET BY MOUTH EVERY DAY 90 tablet 1   Continuous Glucose Receiver (FREESTYLE LIBRE 3 READER) DEVI Use to monitor glucose continuously. 1 each 0   Continuous Glucose Sensor (FREESTYLE LIBRE 3 PLUS SENSOR) MISC 1 each by Does not apply route every 14 (fourteen) days. 2 each 11   dapagliflozin  propanediol (FARXIGA ) 10 MG TABS tablet Take 1 tablet (10 mg total) by mouth daily before breakfast. 90 tablet 3   furosemide  (LASIX ) 20 MG tablet Take 1 tablet (20 mg total) by mouth daily as needed.     hydrALAZINE  (APRESOLINE ) 50 MG tablet Take 1 tablet (50 mg total) by mouth 3 (three) times daily.     losartan  (COZAAR ) 100 MG tablet TAKE 1 TABLET BY MOUTH EVERY DAY 90 tablet 1   metoprolol  succinate (TOPROL -XL) 100 MG 24 hr tablet TAKE 1 TABLET BY MOUTH DAILY WITH OR IMMEDIATELY FOLLOWING A MEAL  90 tablet 1   montelukast  (SINGULAIR ) 10 MG tablet TAKE 1 TABLET BY MOUTH EVERY DAY 90 tablet 1   nystatin -triamcinolone  ointment (MYCOLOG) Apply topically as needed. 30 g 4   ondansetron  (ZOFRAN -ODT) 4 MG disintegrating tablet Take 1 tablet (4 mg total) by mouth every 8 (eight) hours as needed for nausea or vomiting. (Patient not taking: Reported on 02/10/2024) 20 tablet 0   OVER THE COUNTER MEDICATION Take 1 tablet by mouth daily. Multivitamin centrum 1 daily     rosuvastatin  (CRESTOR ) 20 MG tablet Take 1 tablet (20 mg total) by mouth daily. 90 tablet 3   Semaglutide , 1 MG/DOSE, (OZEMPIC , 1 MG/DOSE,) 4 MG/3ML SOPN Inject 1 mg into the skin once a week. 9 mL 3   spironolactone  (ALDACTONE ) 25 MG tablet Take 1 tablet (25 mg total) by mouth daily. 90 tablet 3   tacrolimus  (PROTOPIC ) 0.1 % ointment Apply topically 2 (two) times daily. (Patient taking differently: Apply 1 Application topically daily as needed (blisters).) 30 g 2   No current facility-administered medications on file prior to visit.     Review of Systems     Objective:  There were no vitals filed for this visit. BP Readings from Last 3 Encounters:  02/10/24 (!) 167/65  02/03/24 ROLLEN)  146/71  01/24/24 (!) 150/70   Wt Readings from Last 3 Encounters:  02/10/24 147 lb 3.2 oz (66.8 kg)  02/03/24 155 lb 13.8 oz (70.7 kg)  01/24/24 150 lb (68 kg)   There is no height or weight on file to calculate BMI.    Physical Exam     Lab Results  Component Value Date   WBC 7.8 05/29/2023   HGB 13.8 05/29/2023   HCT 42.1 05/29/2023   PLT 309.0 05/29/2023   GLUCOSE 117 (H) 01/28/2024   CHOL 164 05/29/2023   TRIG 72.0 05/29/2023   HDL 73.70 05/29/2023   LDLDIRECT 126.7 03/24/2013   LDLCALC 76 05/29/2023   ALT 13 05/29/2023   AST 14 05/29/2023   NA 142 01/28/2024   K 3.6 01/28/2024   CL 107 01/28/2024   CREATININE 0.79 01/28/2024   BUN 14 01/28/2024   CO2 23 01/28/2024   TSH 1.28 08/10/2021   INR 0.96 07/07/2018   HGBA1C  6.1 (A) 12/09/2023   MICROALBUR 75.6 12/09/2023     Assessment & Plan:    See Problem List for Assessment and Plan of chronic medical problems.

## 2024-02-13 NOTE — Patient Instructions (Addendum)
          Medications changes include :   start valsartan  320 mg daily.  Stop losartan  100 mg daily      Return in 3 weeks (on 03/06/2024) for hypertension.

## 2024-02-14 ENCOUNTER — Encounter: Payer: Self-pay | Admitting: Internal Medicine

## 2024-02-14 ENCOUNTER — Ambulatory Visit: Admitting: Internal Medicine

## 2024-02-14 VITALS — BP 142/82 | HR 74 | Temp 98.1°F | Ht 63.0 in | Wt 146.0 lb

## 2024-02-14 DIAGNOSIS — I10 Essential (primary) hypertension: Secondary | ICD-10-CM

## 2024-02-14 MED ORDER — VALSARTAN 320 MG PO TABS
320.0000 mg | ORAL_TABLET | Freq: Every day | ORAL | 3 refills | Status: DC
Start: 2024-02-14 — End: 2024-03-16

## 2024-02-14 MED ORDER — METOPROLOL SUCCINATE ER 100 MG PO TB24: ORAL_TABLET | ORAL | 1 refills | Status: DC

## 2024-02-14 MED ORDER — VALSARTAN 80 MG PO TABS: 80.0000 mg | ORAL_TABLET | Freq: Every day | ORAL | 5 refills | Status: DC

## 2024-02-14 NOTE — Assessment & Plan Note (Addendum)
 Chronic Not controlled Start valsartan  320 mg daily Stop losartan  100 mg daily Continue hydralazine  to 50 mg 3 times daily, amlodipine  5 mg daily, metoprolol  XL 100 mg daily, spironolactone  25 mg daily Monitor BP-keep a log Follow-up in 3 weeks-Will need blood work at that time

## 2024-02-24 ENCOUNTER — Encounter: Admitting: Plastic Surgery

## 2024-02-24 ENCOUNTER — Encounter: Admitting: Obstetrics and Gynecology

## 2024-02-26 ENCOUNTER — Ambulatory Visit: Admitting: Plastic Surgery

## 2024-02-26 ENCOUNTER — Encounter: Payer: Self-pay | Admitting: Plastic Surgery

## 2024-02-26 VITALS — BP 150/78 | HR 83 | Temp 97.5°F | Ht 63.0 in | Wt 145.0 lb

## 2024-02-26 DIAGNOSIS — Z9889 Other specified postprocedural states: Secondary | ICD-10-CM

## 2024-02-26 NOTE — Progress Notes (Signed)
 Rebekah Kennedy returns today for evaluation approximately 4 weeks postop from a panniculectomy.  She still has her right drain in place.  She does not have her records today but she thinks that she is close to 30 mL per 24 hours.  On examination she has multiple dressings which are still in place all of which been removed and replaced.  The incisions are well-healed without any evidence of erythema or drainage.  She will continue to monitor her drain output.  If it is indeed 30 mL or less over 24 hours she can have her drain removed tomorrow or Friday.  Dressings as desired.  Continue with compression for the full 6 weeks.  Keep scheduled follow-up appointments.

## 2024-02-28 ENCOUNTER — Other Ambulatory Visit: Payer: Self-pay | Admitting: Internal Medicine

## 2024-02-28 ENCOUNTER — Ambulatory Visit: Admitting: Physician Assistant

## 2024-02-28 VITALS — BP 157/80

## 2024-02-28 DIAGNOSIS — Z9889 Other specified postprocedural states: Secondary | ICD-10-CM

## 2024-02-28 NOTE — Progress Notes (Signed)
 Patient is a pleasant 68 year old female s/p panniculectomy performed 02/03/2024 by Dr. Waddell who presents for clinic for postoperative follow-up and consideration of drain removal.    She was seen by Dr. Waddell 02/26/2024.  At that time, her exam was benign.  However, she did not have her recording of volume output from her remaining drain.  Plan was for her to return to office for removal of final drain once output is definitively less than 30 cc over 24-hour period.  Today, patient is doing well.  She states that when she came in earlier this week she had measuring her output and it was typically 40 cc over a 48-hour period.  Since then, she has been measuring over a 24-hour period and it has been consistently less than 20 cc.  She is hopeful for drain removal today.  She denies any fevers, chest pain, difficulty breathing, or new/unusual leg swelling.  She does endorse chronic lower extremity edema.    On exam, drain is intact and functional with normal-appearing serous output.  Abdomen is soft, nondistended.  No significant TTP.  Drain tube insertion site is mildly irritated, but no overt evidence concerning for infection.  No induration.  Incision largely CDI throughout with mild amount of incisional scabbing centrally and just right of the midline.  Drain removed without complication or difficulty, well-tolerated by patient.  Emphasized the importance of continued compression 24/7.  Advised against placing any occlusive dressing/ointments over the drain tube insertion site and instead to just apply dry gauze for collecting any residual drainage.  She can apply Vaseline to the incision and the contralateral drain tube insertion site which is already well-healed.  Follow-up in 10 days, as scheduled.  She understands to call the office if she had questions or concerns in interim.

## 2024-03-02 ENCOUNTER — Encounter: Payer: Self-pay | Admitting: Internal Medicine

## 2024-03-02 ENCOUNTER — Ambulatory Visit: Admitting: Internal Medicine

## 2024-03-02 ENCOUNTER — Ambulatory Visit: Payer: Self-pay

## 2024-03-02 VITALS — BP 160/76 | HR 84 | Temp 98.1°F | Resp 16 | Wt 145.8 lb

## 2024-03-02 DIAGNOSIS — E1151 Type 2 diabetes mellitus with diabetic peripheral angiopathy without gangrene: Secondary | ICD-10-CM | POA: Diagnosis not present

## 2024-03-02 DIAGNOSIS — R600 Localized edema: Secondary | ICD-10-CM

## 2024-03-02 DIAGNOSIS — I10 Essential (primary) hypertension: Secondary | ICD-10-CM

## 2024-03-02 DIAGNOSIS — R7989 Other specified abnormal findings of blood chemistry: Secondary | ICD-10-CM | POA: Diagnosis not present

## 2024-03-02 DIAGNOSIS — L109 Pemphigus, unspecified: Secondary | ICD-10-CM

## 2024-03-02 DIAGNOSIS — R9431 Abnormal electrocardiogram [ECG] [EKG]: Secondary | ICD-10-CM

## 2024-03-02 LAB — CBC WITH DIFFERENTIAL/PLATELET
Basophils Absolute: 0.1 K/uL (ref 0.0–0.1)
Basophils Relative: 1.1 % (ref 0.0–3.0)
Eosinophils Absolute: 0.2 K/uL (ref 0.0–0.7)
Eosinophils Relative: 3.6 % (ref 0.0–5.0)
HCT: 36.5 % (ref 36.0–46.0)
Hemoglobin: 11.8 g/dL — ABNORMAL LOW (ref 12.0–15.0)
Lymphocytes Relative: 27.3 % (ref 12.0–46.0)
Lymphs Abs: 1.8 K/uL (ref 0.7–4.0)
MCHC: 32.4 g/dL (ref 30.0–36.0)
MCV: 83.9 fl (ref 78.0–100.0)
Monocytes Absolute: 0.5 K/uL (ref 0.1–1.0)
Monocytes Relative: 7.8 % (ref 3.0–12.0)
Neutro Abs: 4 K/uL (ref 1.4–7.7)
Neutrophils Relative %: 60.2 % (ref 43.0–77.0)
Platelets: 280 K/uL (ref 150.0–400.0)
RBC: 4.35 Mil/uL (ref 3.87–5.11)
RDW: 14.4 % (ref 11.5–15.5)
WBC: 6.7 K/uL (ref 4.0–10.5)

## 2024-03-02 LAB — BRAIN NATRIURETIC PEPTIDE: Pro B Natriuretic peptide (BNP): 40 pg/mL (ref 0.0–100.0)

## 2024-03-02 LAB — HEPATIC FUNCTION PANEL
ALT: 13 U/L (ref 0–35)
AST: 13 U/L (ref 0–37)
Albumin: 4 g/dL (ref 3.5–5.2)
Alkaline Phosphatase: 84 U/L (ref 39–117)
Bilirubin, Direct: 0.1 mg/dL (ref 0.0–0.3)
Total Bilirubin: 0.6 mg/dL (ref 0.2–1.2)
Total Protein: 6.9 g/dL (ref 6.0–8.3)

## 2024-03-02 LAB — TROPONIN I (HIGH SENSITIVITY): High Sens Troponin I: 46 ng/L (ref 2–17)

## 2024-03-02 LAB — D-DIMER, QUANTITATIVE: D-Dimer, Quant: 0.63 ug{FEU}/mL — ABNORMAL HIGH (ref <0.50)

## 2024-03-02 LAB — HEMOGLOBIN A1C: Hgb A1c MFr Bld: 6 % (ref 4.6–6.5)

## 2024-03-02 MED ORDER — TORSEMIDE 20 MG PO TABS: 20.0000 mg | ORAL_TABLET | Freq: Every day | ORAL | 0 refills | Status: DC

## 2024-03-02 NOTE — Patient Instructions (Signed)
Edema  Edema is an abnormal buildup of fluids in the body tissues and under the skin. Swelling of the legs, feet, and ankles is a common symptom that becomes more likely as you get older. Swelling is also common in looser tissues, such as around the eyes. Pressing on the area may make a temporary dent in your skin (pitting edema). This fluid may also accumulate in your lungs (pulmonary edema). There are many possible causes of edema. Eating too much salt (sodium) and being on your feet or sitting for a long time can cause edema in your legs, feet, and ankles. Common causes of edema include: Certain medical conditions, such as heart failure, liver or kidney disease, and cancer. Weak leg blood vessels. An injury. Pregnancy. Medicines. Being obese. Low protein levels in the blood. Hot weather may make edema worse. Edema is usually painless. Your skin may look swollen or shiny. Follow these instructions at home: Medicines Take over-the-counter and prescription medicines only as told by your health care provider. Your health care provider may prescribe a medicine to help your body get rid of extra water (diuretic). Take this medicine if you are told to take it. Eating and drinking Eat a low-salt (low-sodium) diet to reduce fluid as told by your health care provider. Sometimes, eating less salt may reduce swelling. Depending on the cause of your swelling, you may need to limit how much fluid you drink (fluid restriction). General instructions Raise (elevate) the injured area above the level of your heart while you are sitting or lying down. Do not sit still or stand for long periods of time. Do not wear tight clothing. Do not wear garters on your upper legs. Exercise your legs to get your circulation going. This helps to move the fluid back into your blood vessels, and it may help the swelling go down. Wear compression stockings as told by your health care provider. These stockings help to prevent  blood clots and reduce swelling in your legs. It is important that these are the correct size. These stockings should be prescribed by your health care provider to prevent possible injuries. If elastic bandages or wraps are recommended, use them as told by your health care provider. Contact a health care provider if: Your edema does not get better with treatment. You have heart, liver, or kidney disease and have symptoms of edema. You have sudden and unexplained weight gain. Get help right away if: You develop shortness of breath or chest pain. You cannot breathe when you lie down. You develop pain, redness, or warmth in the swollen areas. You have heart, liver, or kidney disease and suddenly get edema. You have a fever and your symptoms suddenly get worse. These symptoms may be an emergency. Get help right away. Call 911. Do not wait to see if the symptoms will go away. Do not drive yourself to the hospital. Summary Edema is an abnormal buildup of fluids in the body tissues and under the skin. Eating too much salt (sodium)and being on your feet or sitting for a long time can cause edema in your legs, feet, and ankles. Raise (elevate) the injured area above the level of your heart while you are sitting or lying down. Follow your health care provider's instructions about diet and how much fluid you can drink. This information is not intended to replace advice given to you by your health care provider. Make sure you discuss any questions you have with your health care provider. Document Revised: 01/09/2021 Document  Reviewed: 01/09/2021 Elsevier Patient Education  2024 [employer].

## 2024-03-02 NOTE — Telephone Encounter (Signed)
Pt has appt scheduled today

## 2024-03-02 NOTE — Telephone Encounter (Signed)
 Critical result- Troponin of 46- Rea Satterfield with the lab Critical result relayed to Dr Garald on call.   Copied from CRM [id number]. Topic: Clinical - Lab/Test Results >> Mar 02, 2024  5:47 PM Shereese L wrote: Reason for CRM: Critical Labs after hours Transferred to NT

## 2024-03-02 NOTE — Telephone Encounter (Addendum)
 On call: I was called with a critical value of troponin 46. I reviewed Dr. Joshua' office note from today.  There was no active chest pain noted.  Since elevated troponin could be related to her recent panniculitis surgery, edema etc. - will wait till all other tests are available for our review.

## 2024-03-02 NOTE — Telephone Encounter (Signed)
 FYI Only or Action Required?: FYI only for provider.  Patient was last seen in primary care on 02/14/2024 by Geofm Glade PARAS, MD.  Called Nurse Triage reporting Leg Swelling.  Symptoms began several days ago.  Interventions attempted: Other: cleaning area with alcohol .  Symptoms are: gradually worsening.  Triage Disposition: See Physician Within 24 Hours  Patient/caregiver understands and will follow disposition?: Yes                             Copied from CRM #[id number]. Topic: Clinical - Red Word Triage >> Mar 02, 2024  8:27 AM Donna BRAVO wrote: Red Word that prompted transfer to Nurse Triage: patient has lymphedema in both legs, right leg blister that is getting bigger. Reason for Disposition  Looks like a boil, infected sore, deep ulcer or other infected rash (spreading redness, pus)  Answer Assessment - Initial Assessment Questions 1. ONSET: When did the swelling start? (e.g., minutes, hours, days)     Friday 2. LOCATION: What part of the leg is swollen?  Are both legs swollen or just one leg?     Right lower shin 3. SEVERITY: How bad is the swelling? (e.g., localized; mild, moderate, severe)     Mild and localized- one blister about the size of a 50 cent piece 4. REDNESS: Is there redness or signs of infection?     Denies red, denies drainage 5. PAIN: Is the swelling painful to touch? If Yes, ask: How painful is it?   (Scale 1-10; mild, moderate or severe)     Denies pain 6. FEVER: Do you have a fever? If Yes, ask: What is it, how was it measured, and when did it start?      Denies  7. CAUSE: What do you think is causing the leg swelling?     Lymphedema  8. MEDICAL HISTORY: Do you have a history of blood clots (e.g., DVT), cancer, heart failure, kidney disease, or liver failure?     History of lymphedema  9. RECURRENT SYMPTOM: Have you had leg swelling before? If Yes, ask: When was the last time? What happened that  time?     Yes, states patches of lymphedema typically go away, rather than growing in size  10. OTHER SYMPTOMS: Do you have any other symptoms? (e.g., chest pain, difficulty breathing)     Denies difficulty breathing, denies chest pain 11. PREGNANCY: Is there any chance you are pregnant? When was your last menstrual period?      N/A  Protocols used: Leg Swelling and Edema-A-AH

## 2024-03-02 NOTE — Progress Notes (Unsigned)
 Subjective:  Patient ID: Rebekah Kennedy, female    DOB: Jun 28, 1955  Age: 68 y.o. MRN: [id number]  CC: Blister (Pt c/o of edema and blister on right lower extremity for 3 days; no pain is present ) and Hypertension   HPI Rebekah Kennedy presents for f/up --  Discussed the use of AI scribe software for clinical note transcription with the patient, who gave verbal consent to proceed.  History of Present Illness Rebekah Kennedy is a 68 year old female with lymphedema and diabetes who presents with leg swelling and blister formation.  She developed a large blister on her leg starting Friday evening. She recently underwent a panniculectomy on September 15, during which a drain was placed in her abdomen. The second drain was removed on Friday, and she speculates that the abdominal band might be pushing fluid down to her legs, contributing to the swelling.  She has a history of leg swelling due to lymphedema, which is not new for her. However, the blister formation is a new occurrence. The swelling is described as tight, particularly in her feet, and she notes tingling in her feet and fingers. She has been prescribed furosemide  to take as needed, but she does not take it often and usually takes it at night, which she finds inconvenient as it affects her ability to go out during the day.  No abdominal or groin pain, except for discomfort related to the recent surgical procedure. She has experienced 'ugly lesions' since her stroke in 2020, but not blisters like the current one.  Her blood pressure has been elevated recently, with a reading of 150/80 today. Previously, her blood pressure was controlled at 120/80 but has been rising, prompting a visit to a kidney specialist. Her medication regimen was adjusted, which has helped lower her blood pressure from a high of 160/76.     Outpatient Medications Prior to Visit  Medication Sig Dispense Refill   acetaminophen  (TYLENOL ) 500 MG tablet Take  1 tablet (500 mg total) by mouth every 6 (six) hours as needed. 30 tablet 0   albuterol  (PROVENTIL  HFA;VENTOLIN  HFA) 108 (90 Base) MCG/ACT inhaler Inhale 2 puffs into the lungs every 4 (four) hours as needed for wheezing or shortness of breath. 18 g 2   aspirin  EC 81 MG EC tablet Take 1 tablet (81 mg total) by mouth daily. 30 tablet 0   Biotin  5000 MCG TABS Take 10,000 mcg by mouth daily.      Cholecalciferol (VITAMIN D3 PO) Take 2,000 Units by mouth daily.     clopidogrel  (PLAVIX ) 75 MG tablet TAKE 1 TABLET BY MOUTH EVERY DAY 90 tablet 1   Continuous Glucose Receiver (FREESTYLE LIBRE 3 READER) DEVI Use to monitor glucose continuously. 1 each 0   Continuous Glucose Sensor (FREESTYLE LIBRE 3 PLUS SENSOR) MISC 1 each by Does not apply route every 14 (fourteen) days. 2 each 11   dapagliflozin  propanediol (FARXIGA ) 10 MG TABS tablet Take 1 tablet (10 mg total) by mouth daily before breakfast. 90 tablet 3   hydrALAZINE  (APRESOLINE ) 50 MG tablet Take 1 tablet (50 mg total) by mouth 3 (three) times daily.     metoprolol  succinate (TOPROL -XL) 100 MG 24 hr tablet TAKE 1 TABLET BY MOUTH DAILY WITH OR IMMEDIATELY FOLLOWING A MEAL 90 tablet 1   montelukast  (SINGULAIR ) 10 MG tablet TAKE 1 TABLET BY MOUTH EVERY DAY 90 tablet 1   nystatin -triamcinolone  ointment (MYCOLOG) Apply topically as needed. 30 g 4  ondansetron  (ZOFRAN -ODT) 4 MG disintegrating tablet Take 1 tablet (4 mg total) by mouth every 8 (eight) hours as needed for nausea or vomiting. 20 tablet 0   OVER THE COUNTER MEDICATION Take 1 tablet by mouth daily. Multivitamin centrum 1 daily     rosuvastatin  (CRESTOR ) 20 MG tablet Take 1 tablet (20 mg total) by mouth daily. 90 tablet 3   Semaglutide , 1 MG/DOSE, (OZEMPIC , 1 MG/DOSE,) 4 MG/3ML SOPN Inject 1 mg into the skin once a week. 9 mL 3   spironolactone  (ALDACTONE ) 25 MG tablet Take 1 tablet (25 mg total) by mouth daily. 90 tablet 3   tacrolimus  (PROTOPIC ) 0.1 % ointment Apply topically 2 (two) times  daily. (Patient taking differently: Apply 1 Application topically daily as needed (blisters).) 30 g 2   valsartan  (DIOVAN ) 320 MG tablet Take 1 tablet (320 mg total) by mouth daily. 90 tablet 3   amLODipine  (NORVASC ) 5 MG tablet TAKE 1 TABLET BY MOUTH EVERY DAY 90 tablet 1   furosemide  (LASIX ) 20 MG tablet TAKE 1 TABLET BY MOUTH EVERY DAY 90 tablet 1   No facility-administered medications prior to visit.    ROS Review of Systems  Constitutional:  Negative for appetite change, chills, diaphoresis, fatigue and fever.  HENT: Negative.    Respiratory: Negative.  Negative for cough, choking, chest tightness, shortness of breath and stridor.   Cardiovascular:  Positive for leg swelling. Negative for chest pain and palpitations.  Gastrointestinal:  Negative for abdominal pain, constipation, diarrhea, nausea and vomiting.  Endocrine: Negative.   Genitourinary: Negative.  Negative for difficulty urinating.  Musculoskeletal: Negative.  Negative for arthralgias, joint swelling and myalgias.  Skin: Negative.   Neurological: Negative.  Negative for dizziness, weakness and numbness.  Hematological:  Negative for adenopathy. Does not bruise/bleed easily.  Psychiatric/Behavioral: Negative.      Objective:  BP (!) 160/76 (BP Location: Right Arm, Patient Position: Sitting, Cuff Size: Large) Comment: BP (L) 160/80  Pulse 84   Temp 98.1 F (36.7 C) (Oral)   Resp 16   Wt 145 lb 12.8 oz (66.1 kg)   SpO2 98%   BMI 25.83 kg/m   BP Readings from Last 3 Encounters:  03/02/24 (!) 160/76  02/28/24 (!) 157/80  02/26/24 (!) 150/78    Wt Readings from Last 3 Encounters:  03/02/24 145 lb 12.8 oz (66.1 kg)  02/26/24 145 lb (65.8 kg)  02/14/24 146 lb (66.2 kg)    Physical Exam Vitals reviewed.  Constitutional:      Appearance: Normal appearance.  HENT:     Mouth/Throat:     Mouth: Mucous membranes are moist.  Eyes:     General: No scleral icterus.    Conjunctiva/sclera: Conjunctivae normal.   Cardiovascular:     Rate and Rhythm: Normal rate and regular rhythm.     Pulses:          Dorsalis pedis pulses are 1+ on the right side and 1+ on the left side.       Posterior tibial pulses are 1+ on the right side and 1+ on the left side.     Heart sounds: No murmur heard.    No friction rub. No gallop.  Pulmonary:     Effort: Pulmonary effort is normal.     Breath sounds: No stridor. No wheezing, rhonchi or rales.  Abdominal:     General: Abdomen is flat.     Palpations: There is no mass.     Tenderness: There is no abdominal tenderness.  There is no guarding.     Hernia: No hernia is present.  Musculoskeletal:     Cervical back: Neck supple.     Right lower leg: 2+ Pitting Edema present.     Left lower leg: 2+ Pitting Edema present.     Right foot: Normal range of motion.     Left foot: Normal range of motion.  Feet:     Right foot:     Skin integrity: Skin integrity normal.     Left foot:     Skin integrity: Skin integrity normal.  Skin:    General: Skin is warm and dry.     Coloration: Skin is not jaundiced.     Findings: Rash present. No erythema or lesion.  Neurological:     General: No focal deficit present.     Mental Status: She is alert. Mental status is at baseline.  Psychiatric:        Mood and Affect: Mood normal.        Behavior: Behavior normal.     Lab Results  Component Value Date   WBC 6.7 03/02/2024   HGB 11.8 (L) 03/02/2024   HCT 36.5 03/02/2024   PLT 280.0 03/02/2024   GLUCOSE 117 (H) 01/28/2024   CHOL 164 05/29/2023   TRIG 72.0 05/29/2023   HDL 73.70 05/29/2023   LDLDIRECT 126.7 03/24/2013   LDLCALC 76 05/29/2023   ALT 13 03/02/2024   AST 13 03/02/2024   NA 142 01/28/2024   K 3.6 01/28/2024   CL 107 01/28/2024   CREATININE 0.79 01/28/2024   BUN 14 01/28/2024   CO2 23 01/28/2024   TSH 1.32 03/02/2024   INR 0.96 07/07/2018   HGBA1C 6.0 03/02/2024   MICROALBUR 75.6 12/09/2023    No results found.  Assessment & Plan:    Bilateral leg edema- Will discontinue the CCB and upgrade the loop diuretic. Will evaluate for CHF with an ECHO. -     Troponin I (High Sensitivity); Future -     D-dimer, quantitative; Future -     Brain natriuretic peptide; Future -     TSH; Future -     Urinalysis, Routine w reflex microscopic; Future -     Hepatic function panel; Future -     Torsemide; Take 1 tablet (20 mg total) by mouth daily.  Dispense: 90 tablet; Refill: 0  Primary hypertension- BP is not at goal. -     TSH; Future -     Urinalysis, Routine w reflex microscopic; Future -     Hepatic function panel; Future -     CBC with Differential/Platelet; Future -     Torsemide; Take 1 tablet (20 mg total) by mouth daily.  Dispense: 90 tablet; Refill: 0 -     AMB Referral [facility] Care Management  Type 2 diabetes mellitus with diabetic peripheral angiopathy without gangrene, without long-term current use of insulin  (HCC) -     Hemoglobin A1c; Future  Elevated troponin- Will evaluate for CAD. -     CT CORONARY MORPH W/CTA COR W/SCORE W/CA W/CM &/OR WO/CM; Future -     ECHOCARDIOGRAM COMPLETE; Future  Abnormal electrocardiogram (ECG) (EKG)- Will assess the EF with an ECHO. -     CT CORONARY MORPH W/CTA COR W/SCORE W/CA W/CM &/OR WO/CM; Future -     ECHOCARDIOGRAM COMPLETE; Future  Pemphigus (HCC) -     predniSONE; Take 2 tablets (40 mg total) by mouth daily with breakfast for 21 days.  Dispense: 42  tablet; Refill: 0     Follow-up: Return in about 6 weeks (around 04/13/2024).  Debby Molt, MD

## 2024-03-03 ENCOUNTER — Ambulatory Visit: Payer: Self-pay | Admitting: Internal Medicine

## 2024-03-03 DIAGNOSIS — R7989 Other specified abnormal findings of blood chemistry: Secondary | ICD-10-CM | POA: Insufficient documentation

## 2024-03-03 LAB — URINALYSIS, ROUTINE W REFLEX MICROSCOPIC
Bilirubin Urine: NEGATIVE
Hgb urine dipstick: NEGATIVE
Ketones, ur: NEGATIVE
Leukocytes,Ua: NEGATIVE
Nitrite: NEGATIVE
RBC / HPF: NONE SEEN (ref 0–?)
Specific Gravity, Urine: 1.02 (ref 1.000–1.030)
Total Protein, Urine: 30 — AB
Urine Glucose: >=1000 — AB
Urobilinogen, UA: 1 (ref 0.0–1.0)
pH: 6 (ref 5.0–8.0)

## 2024-03-03 LAB — TSH: TSH: 1.32 u[IU]/mL (ref 0.35–5.50)

## 2024-03-03 MED ORDER — PREDNISONE 20 MG PO TABS: 40.0000 mg | ORAL_TABLET | Freq: Every day | ORAL | 0 refills | Status: AC

## 2024-03-03 NOTE — Progress Notes (Signed)
 Please ask her to read her mychart message.

## 2024-03-04 ENCOUNTER — Other Ambulatory Visit

## 2024-03-04 ENCOUNTER — Telehealth: Payer: Self-pay

## 2024-03-04 ENCOUNTER — Telehealth: Payer: Self-pay | Admitting: Internal Medicine

## 2024-03-04 DIAGNOSIS — R7989 Other specified abnormal findings of blood chemistry: Secondary | ICD-10-CM

## 2024-03-04 DIAGNOSIS — R9431 Abnormal electrocardiogram [ECG] [EKG]: Secondary | ICD-10-CM | POA: Diagnosis not present

## 2024-03-04 LAB — TROPONIN I (HIGH SENSITIVITY): High Sens Troponin I: 34 ng/L (ref 2–17)

## 2024-03-04 NOTE — Telephone Encounter (Signed)
 PT states she has lost some medication and is asking for samples to replace metoprolol  and valsartan .  Can be reached at Cell Phone #: [phone]

## 2024-03-05 ENCOUNTER — Telehealth: Payer: Self-pay | Admitting: *Deleted

## 2024-03-05 NOTE — Telephone Encounter (Signed)
 See other phone note

## 2024-03-05 NOTE — Telephone Encounter (Signed)
This was addressed by Dr. Ronnald Ramp.

## 2024-03-05 NOTE — Progress Notes (Signed)
 Care Guide Pharmacy Note  03/05/2024 Name: Rebekah Kennedy MRN: [id number] DOB: January 22, 1956  Referred By: Geofm Glade PARAS, MD Reason for referral: Complex Care Management (Outreach to schedule referral with pharmacist )   Rebekah Kennedy is a 68 y.o. year old female who is a primary care patient of Geofm Glade PARAS, MD.  Rebekah Kennedy was referred to the pharmacist for assistance related to: HTN  Successful contact was made with the patient to discuss pharmacy services including being ready for the pharmacist to call at least 5 minutes before the scheduled appointment time and to have medication bottles and any blood pressure readings ready for review. The patient agreed to meet with the pharmacist via telephone visit on 03/13/2024  Thedford Franks, CMA [place]  [facility] Guide Direct Dial: [phone]  Fax: [phone] Website: [email or web address]

## 2024-03-05 NOTE — Telephone Encounter (Signed)
 Patient was able to get her medication yesterday.

## 2024-03-09 ENCOUNTER — Encounter (HOSPITAL_COMMUNITY): Payer: Self-pay

## 2024-03-09 ENCOUNTER — Ambulatory Visit: Admitting: Internal Medicine

## 2024-03-09 ENCOUNTER — Ambulatory Visit: Admitting: Student

## 2024-03-09 VITALS — BP 175/74 | HR 66

## 2024-03-09 DIAGNOSIS — Z9889 Other specified postprocedural states: Secondary | ICD-10-CM

## 2024-03-09 NOTE — Progress Notes (Signed)
 Patient is a 68 year old female who underwent panniculectomy with Dr. Waddell on 02/03/2024. Patient is about 5 weeks postop. She presents to the clinic today for postoperative follow up.    Patient was last seen in the clinic on 02/28/2024.  At this visit, patient was doing well.  At this visit, patient's drain output had been about 20 cc 0 last over a 24-hour period.  On exam, drain was intact and functional with serous output.  Abdomen was soft and nondistended.  Incision was clean dry and intact.  Drain was removed without any difficulty.  Today, patient reports she is overall doing well.  She states that on Friday, she did feel lightheaded and almost passed out.  She states that she did follow-up with her PCP.  Patient also does state that she has been having issues with her lymphedema and had a large blister on her right lower extremity.  She reports that both legs have been swollen.  She denies any chest pain or shortness of breath.  She did report that the drain site did drain for a few days, but has since stopped.  She denies any fevers or chills.  Patient's blood pressure is elevated in clinic today.  Patient states that her medications were changed since Friday.  She denies any headaches, changes in her vision.  Chaperone present on exam.  On exam, patient is sitting upright in no acute distress.  Abdomen is overall soft and nontender.  There is no overlying erythema, no obvious fluid collection on exam.  Incision overall appears to be healing well, there are 2 superficial wounds noted to the incision, 1 just right of midline and 1 little bit more out left/laterally.  They are not draining.  There is no surrounding erythema or swelling.  No signs of infection.  Wound to the right lower extremity appears to be superficial, consistent with previous blister that has since drained.  It does not appear to be infected.  I discussed with the patient that I would like her to apply Vaseline throughout  her incision and to the wounds daily.  Discussed with her to continue with her compression at all times and avoid strenuous activities.  Patient expressed understanding.  Wound to the right lower extremity was dressed with Xeroform and a Mepilex border dressing.  Discussed with the patient that she needs to call her PCP today about her right lower extremity wound as well as her elevated blood pressure today.  Patient expressed understanding and stated that she is going to call them today.  Patient to follow back up in 1 to 2 weeks.  I instructed her to call if she has any questions or concerns in the meantime.  Pictures were obtained of the patient and placed in the chart with the patient's or guardian's permission.

## 2024-03-10 ENCOUNTER — Encounter: Payer: Self-pay | Admitting: Internal Medicine

## 2024-03-10 NOTE — Telephone Encounter (Signed)
 Just stay on the current prednisone dose

## 2024-03-12 ENCOUNTER — Ambulatory Visit (HOSPITAL_COMMUNITY)
Admission: RE | Admit: 2024-03-12 | Discharge: 2024-03-12 | Disposition: A | Discharge status: Home / Self Care | Source: Ambulatory Visit | Attending: Internal Medicine | Admitting: Internal Medicine

## 2024-03-12 DIAGNOSIS — R7989 Other specified abnormal findings of blood chemistry: Secondary | ICD-10-CM | POA: Insufficient documentation

## 2024-03-12 DIAGNOSIS — R9431 Abnormal electrocardiogram [ECG] [EKG]: Secondary | ICD-10-CM | POA: Diagnosis present

## 2024-03-12 DIAGNOSIS — I2510 Atherosclerotic heart disease of native coronary artery without angina pectoris: Secondary | ICD-10-CM

## 2024-03-12 LAB — POCT I-STAT CREATININE: Creatinine, Ser: 0.7 mg/dL (ref 0.44–1.00)

## 2024-03-12 MED ORDER — IOHEXOL 350 MG/ML SOLN
100.0000 mL | Freq: Once | INTRAVENOUS | Status: AC | PRN
  Administered 2024-03-12: 100 mL via INTRAVENOUS

## 2024-03-12 MED ORDER — NITROGLYCERIN 0.4 MG SL SUBL
0.8000 mg | SUBLINGUAL_TABLET | Freq: Once | SUBLINGUAL | Status: AC
  Administered 2024-03-12: 0.8 mg via SUBLINGUAL

## 2024-03-13 ENCOUNTER — Other Ambulatory Visit: Payer: Self-pay | Admitting: Internal Medicine

## 2024-03-13 ENCOUNTER — Other Ambulatory Visit

## 2024-03-13 DIAGNOSIS — I10 Essential (primary) hypertension: Secondary | ICD-10-CM

## 2024-03-13 DIAGNOSIS — I25118 Atherosclerotic heart disease of native coronary artery with other forms of angina pectoris: Secondary | ICD-10-CM

## 2024-03-13 MED ORDER — ASPIRIN 81 MG PO TBEC: 81.0000 mg | DELAYED_RELEASE_TABLET | Freq: Every day | ORAL | 1 refills | Status: AC

## 2024-03-13 MED ORDER — ISOSORBIDE MONONITRATE ER 30 MG PO TB24: 30.0000 mg | ORAL_TABLET | Freq: Every day | ORAL | 0 refills | Status: DC

## 2024-03-13 NOTE — Progress Notes (Signed)
 03/18/2024 Name: Rebekah Kennedy MRN: [id number] DOB: 03-Dec-1955  Chief Complaint  Patient presents with   Hypertension   Medication Management    Rebekah Kennedy is a 68 y.o. year old female who presented for a telephone visit.   They were referred to the pharmacist by Dr. Joshua for assistance in managing hypertension.   Subjective:  Care Team: Primary Care Provider: Geofm Glade PARAS, MD ; Next Scheduled Visit: 03/16/24   Medication Access/Adherence  Current Pharmacy:  [phone] - [place], [place] - [address] [place] [address] Phone: [phone] Fax: [phone]  [facility] [address], [place] - [address] [place] [address] Phone: [phone] Fax: [phone]   Patient reports affordability concerns with their medications: No  Patient reports access/transportation concerns to their pharmacy: No  Patient reports adherence concerns with their medications:  Yes     Hypertension:  Current medications: losartan  100 mg daily (Started taking this again after she tried valsartan  320 mg and couldn't tolerate), hydralazine  50 mg TID, metoprolol  succinate 100 mg daily, spironolactone  25 mg daily, torsemide 20 mg daily Medications previously tried: amlodipine   *Pt notes EMS was called the same day she took her first valsartan  320 mg because she almost passed out. She notes when EMS checked her vitals, they were normal. She did not try valsartan  again, restarted losartan  100 mg  Patient has a validated, automated, upper arm home BP cuff Current blood pressure readings: BP at home 173/93  Patient denies hypotensive s/sx including dizziness, lightheadedness since stopped valsartan  Patient denies hypertensive symptoms including headache, chest pain, shortness of breath   Objective:  Lab Results  Component Value Date   HGBA1C 6.0 03/02/2024    Lab Results  Component Value Date   CREATININE 0.70 03/12/2024    BUN 14 01/28/2024   NA 142 01/28/2024   K 3.6 01/28/2024   CL 107 01/28/2024   CO2 23 01/28/2024    Lab Results  Component Value Date   CHOL 164 05/29/2023   HDL 73.70 05/29/2023   LDLCALC 76 05/29/2023   LDLDIRECT 126.7 03/24/2013   TRIG 72.0 05/29/2023   CHOLHDL 2 05/29/2023    Medications Reviewed Today     Reviewed by Merceda Lela SAUNDERS, RPH (Pharmacist) on 03/18/24 at 1348  Med List Status: <None>   Medication Order Taking? Sig Documenting Provider Last Dose Status Informant  acetaminophen  (TYLENOL ) 500 MG tablet [id number]  Take 1 tablet (500 mg total) by mouth every 6 (six) hours as needed. Montorfano, Lisandro M, MD  Active   albuterol  (PROVENTIL  HFA;VENTOLIN  HFA) 108 (90 Base) MCG/ACT inhaler [id number]  Inhale 2 puffs into the lungs every 4 (four) hours as needed for wheezing or shortness of breath. Geofm Glade PARAS, MD  Active Self  aspirin  EC 81 MG tablet [id number] Yes Take 1 tablet (81 mg total) by mouth daily. Joshua Debby CROME, MD  Active   Biotin  5000 MCG TABS [id number]  Take 10,000 mcg by mouth daily.  [provider]  Active Self  Cholecalciferol (VITAMIN D3 PO) [id number] Yes Take 2,000 Units by mouth daily. [provider]  Active Self  clopidogrel  (PLAVIX ) 75 MG tablet [id number] Yes TAKE 1 TABLET BY MOUTH EVERY DAY Burns, Glade PARAS, MD  Active   Continuous Glucose Receiver (FREESTYLE LIBRE 3 READER) DEVI [id number]  Use to monitor glucose continuously. Trixie Lela, MD  Active   Continuous Glucose Sensor (FREESTYLE LIBRE 3 PLUS SENSOR)  MISC [id number]  1 each by Does not apply route every 14 (fourteen) days. Trixie File, MD  Active   dapagliflozin  propanediol (FARXIGA ) 10 MG TABS tablet [id number] Yes Take 1 tablet (10 mg total) by mouth daily before breakfast. Trixie File, MD  Active   hydrALAZINE  (APRESOLINE ) 50 MG tablet [id number] Yes Take 1 tablet (50 mg total) by mouth 3 (three) times daily. Geofm Glade PARAS, MD  Active   isosorbide  mononitrate (IMDUR) 30 MG 24 hr tablet [id number]  Take 1 tablet (30 mg total) by mouth daily. Joshua Debby CROME, MD  Active   metoprolol  succinate (TOPROL -XL) 100 MG 24 hr tablet [id number] Yes TAKE 1 TABLET BY MOUTH DAILY WITH OR IMMEDIATELY FOLLOWING A MEAL Burns, Glade PARAS, MD  Active   montelukast  (SINGULAIR ) 10 MG tablet [id number]  TAKE 1 TABLET BY MOUTH EVERY DAY Geofm Glade PARAS, MD  Active   nystatin -triamcinolone  ointment [facility]) [id number]  Apply topically as needed. Geofm Glade PARAS, MD  Active   ondansetron  (ZOFRAN -ODT) 4 MG disintegrating tablet [id number]  Take 1 tablet (4 mg total) by mouth every 8 (eight) hours as needed for nausea or vomiting. Montorfano, Lisandro M, MD  Active   OVER THE COUNTER MEDICATION [id number]  Take 1 tablet by mouth daily. Multivitamin centrum 1 daily [provider]  Active Self  predniSONE (DELTASONE) 20 MG tablet [id number]  Take 2 tablets (40 mg total) by mouth daily with breakfast for 21 days. Joshua Debby CROME, MD  Active   rosuvastatin  (CRESTOR ) 20 MG tablet [id number] Yes Take 1 tablet (20 mg total) by mouth daily. Trixie File, MD  Active   Semaglutide , 1 MG/DOSE, (OZEMPIC , 1 MG/DOSE,) 4 MG/3ML SOPN [id number] Yes Inject 1 mg into the skin once a week. Trixie File, MD  Active   spironolactone  (ALDACTONE ) 25 MG tablet [id number] Yes Take 1 tablet (25 mg total) by mouth daily. Geofm Glade PARAS, MD  Active   tacrolimus  (PROTOPIC ) 0.1 % ointment [id number]  Apply topically 2 (two) times daily.  Patient taking differently: Apply 1 Application topically daily as needed (blisters).   Geofm Glade PARAS, MD  Active Self  torsemide (DEMADEX) 20 MG tablet [id number] Yes Take 1 tablet (20 mg total) by mouth daily. Joshua Debby CROME, MD  Active   valsartan  (DIOVAN ) 320 MG tablet [id number]  Take 0.5 tablets (160 mg total) by mouth daily. Geofm Glade PARAS, MD  Active               Assessment/Plan:   Hypertension: - Currently uncontrolled, BP goal <130/80 -  Reviewed long term cardiovascular and renal outcomes of uncontrolled blood pressure - Reviewed appropriate blood pressure monitoring technique and reviewed goal blood pressure. Recommended to check home blood pressure and heart rate daily - Recommend to retry valsartan  but take valsartan  320 mg 1/2 tablet daily to start - Continue other medications - Has PCP f/u 10/27    Follow Up Plan: PCP f/u 10/27  Darrelyn Drum, PharmD, BCPS, CPP Clinical Pharmacist Practitioner [facility] [phone]

## 2024-03-15 ENCOUNTER — Encounter: Payer: Self-pay | Admitting: Internal Medicine

## 2024-03-15 DIAGNOSIS — I2510 Atherosclerotic heart disease of native coronary artery without angina pectoris: Secondary | ICD-10-CM | POA: Insufficient documentation

## 2024-03-15 NOTE — Progress Notes (Unsigned)
 Subjective:    Patient ID: Rebekah Kennedy, female    DOB: [birth year], 68 y.o.   MRN: [id number]     HPI Rebekah Kennedy is here for follow up of her chronic medical problems.  S/p 10/20 - panniculectomy-healing well, but has been wearing an abdominal binder.  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  Echo ordered - not yet done.  She is asymptomatic.  Started on torsemide - lasix  prn stopped.  Started on Imdur.  CCB stopped.   Her right lower leg blister popped.  On the left lower leg is swollen and it does itch at times and she thinks she scratches at night and she has an area that has been bleeding.  She mostly sleeps in a recliner with her legs up.  She rarely elevates her legs during the day.    She is wearing her abdominal binder daily - has to wear it for 6 more weeks.    Taking as of today:  valsartan  160 mg daily Torsemide 20 mg daily Spironolactone  25 mg daily Metoprolol  xl 100 mg daily Imdur 30 mg daily Hydralazine  50 mg three a day  Drinking a good amount of water.    BP variable at home-did not bring cuff with her.  Medications and allergies reviewed with patient and updated if appropriate.  Current Outpatient Medications on File Prior to Visit  Medication Sig Dispense Refill   acetaminophen  (TYLENOL ) 500 MG tablet Take 1 tablet (500 mg total) by mouth every 6 (six) hours as needed. 30 tablet 0   albuterol  (PROVENTIL  HFA;VENTOLIN  HFA) 108 (90 Base) MCG/ACT inhaler Inhale 2 puffs into the lungs every 4 (four) hours as needed for wheezing or shortness of breath. 18 g 2   aspirin  EC 81 MG tablet Take 1 tablet (81 mg total) by mouth daily. 90 tablet 1   Biotin  5000 MCG TABS Take 10,000 mcg by mouth daily.      Cholecalciferol (VITAMIN D3 PO) Take 2,000 Units by mouth daily.     clopidogrel  (PLAVIX ) 75 MG tablet TAKE 1 TABLET BY MOUTH EVERY DAY 90 tablet 1    Continuous Glucose Receiver (FREESTYLE LIBRE 3 READER) DEVI Use to monitor glucose continuously. 1 each 0   Continuous Glucose Sensor (FREESTYLE LIBRE 3 PLUS SENSOR) MISC 1 each by Does not apply route every 14 (fourteen) days. 2 each 11   dapagliflozin  propanediol (FARXIGA ) 10 MG TABS tablet Take 1 tablet (10 mg total) by mouth daily before breakfast. 90 tablet 3   hydrALAZINE  (APRESOLINE ) 50 MG tablet Take 1 tablet (50 mg total) by mouth 3 (three) times daily.     isosorbide mononitrate (IMDUR) 30 MG 24 hr tablet Take 1 tablet (30 mg total) by mouth daily. 30 tablet 0   metoprolol  succinate (TOPROL -XL) 100 MG 24 hr tablet TAKE 1 TABLET BY MOUTH DAILY WITH OR IMMEDIATELY FOLLOWING A MEAL 90 tablet 1   montelukast  (SINGULAIR ) 10 MG tablet TAKE 1 TABLET BY MOUTH EVERY DAY 90 tablet 1   nystatin -triamcinolone  ointment (MYCOLOG) Apply topically as needed. 30 g 4   ondansetron  (ZOFRAN -ODT) 4 MG disintegrating tablet Take 1 tablet (4 mg total) by mouth every 8 (eight) hours as needed for nausea or vomiting. 20 tablet 0   OVER THE COUNTER MEDICATION Take 1 tablet by mouth daily. Multivitamin centrum 1 daily  predniSONE (DELTASONE) 20 MG tablet Take 2 tablets (40 mg total) by mouth daily with breakfast for 21 days. 42 tablet 0   rosuvastatin  (CRESTOR ) 20 MG tablet Take 1 tablet (20 mg total) by mouth daily. 90 tablet 3   Semaglutide , 1 MG/DOSE, (OZEMPIC , 1 MG/DOSE,) 4 MG/3ML SOPN Inject 1 mg into the skin once a week. 9 mL 3   spironolactone  (ALDACTONE ) 25 MG tablet Take 1 tablet (25 mg total) by mouth daily. 90 tablet 3   tacrolimus  (PROTOPIC ) 0.1 % ointment Apply topically 2 (two) times daily. (Patient taking differently: Apply 1 Application topically daily as needed (blisters).) 30 g 2   torsemide (DEMADEX) 20 MG tablet Take 1 tablet (20 mg total) by mouth daily. 90 tablet 0   valsartan  (DIOVAN ) 320 MG tablet Take 1 tablet (320 mg total) by mouth daily. 90 tablet 3   No current facility-administered  medications on file prior to visit.     Review of Systems  Respiratory:  Positive for wheezing (asthma related - dampness). Negative for cough and shortness of breath.   Cardiovascular:  Positive for leg swelling. Negative for chest pain and palpitations.  Neurological:  Negative for light-headedness and headaches.       Objective:   Vitals:   03/16/24 0813  BP: (!) 140/82  Pulse: 74  Temp: 98.4 F (36.9 C)  SpO2: 98%   BP Readings from Last 3 Encounters:  03/16/24 (!) 140/82  03/12/24 (!) 142/72  03/09/24 (!) 175/74   Wt Readings from Last 3 Encounters:  03/16/24 141 lb (64 kg)  03/02/24 145 lb 12.8 oz (66.1 kg)  02/26/24 145 lb (65.8 kg)   Body mass index is 24.98 kg/m.    Physical Exam Constitutional:      General: She is not in acute distress.    Appearance: Normal appearance.  HENT:     Head: Normocephalic and atraumatic.  Eyes:     Conjunctiva/sclera: Conjunctivae normal.  Cardiovascular:     Rate and Rhythm: Normal rate and regular rhythm.     Heart sounds: Normal heart sounds.  Pulmonary:     Effort: Pulmonary effort is normal. No respiratory distress.     Breath sounds: Normal breath sounds. No wheezing.  Musculoskeletal:     Cervical back: Neck supple.     Right lower leg: Edema (1 + mildly pitting) present.     Left lower leg: Edema (1 + mildly pitting) present.  Lymphadenopathy:     Cervical: No cervical adenopathy.  Skin:    General: Skin is warm and dry.     Findings: No erythema or rash.     Comments: Large ulceration right lower medial leg from popped blister-no surrounding erythema, no edema.  No evidence of infection.  A few wounds on left lower leg-1 of which is bleeding minimally-she thinks she scratched it last night.  No other open wounds  Neurological:     Mental Status: She is alert. Mental status is at baseline.  Psychiatric:        Mood and Affect: Mood normal.        Behavior: Behavior normal.        Lab Results   Component Value Date   WBC 6.7 03/02/2024   HGB 11.8 (L) 03/02/2024   HCT 36.5 03/02/2024   PLT 280.0 03/02/2024   GLUCOSE 117 (H) 01/28/2024   CHOL 164 05/29/2023   TRIG 72.0 05/29/2023   HDL 73.70 05/29/2023   LDLDIRECT 126.7 03/24/2013   LDLCALC  76 05/29/2023   ALT 13 03/02/2024   AST 13 03/02/2024   NA 142 01/28/2024   K 3.6 01/28/2024   CL 107 01/28/2024   CREATININE 0.70 03/12/2024   BUN 14 01/28/2024   CO2 23 01/28/2024   TSH 1.32 03/02/2024   INR 0.96 07/07/2018   HGBA1C 6.0 03/02/2024   MICROALBUR 75.6 12/09/2023     Assessment & Plan:    See Problem List for Assessment and Plan of chronic medical problems.

## 2024-03-16 ENCOUNTER — Ambulatory Visit: Admitting: Internal Medicine

## 2024-03-16 VITALS — BP 152/82 | HR 74 | Temp 98.4°F | Ht 63.0 in | Wt 141.0 lb

## 2024-03-16 DIAGNOSIS — E7849 Other hyperlipidemia: Secondary | ICD-10-CM | POA: Diagnosis not present

## 2024-03-16 DIAGNOSIS — L97911 Non-pressure chronic ulcer of unspecified part of right lower leg limited to breakdown of skin: Secondary | ICD-10-CM

## 2024-03-16 DIAGNOSIS — I10 Essential (primary) hypertension: Secondary | ICD-10-CM

## 2024-03-16 DIAGNOSIS — R600 Localized edema: Secondary | ICD-10-CM

## 2024-03-16 DIAGNOSIS — I2510 Atherosclerotic heart disease of native coronary artery without angina pectoris: Secondary | ICD-10-CM | POA: Diagnosis not present

## 2024-03-16 DIAGNOSIS — L97909 Non-pressure chronic ulcer of unspecified part of unspecified lower leg with unspecified severity: Secondary | ICD-10-CM | POA: Insufficient documentation

## 2024-03-16 MED ORDER — VALSARTAN 320 MG PO TABS: 160.0000 mg | ORAL_TABLET | Freq: Every day | ORAL | Status: DC

## 2024-03-16 NOTE — Assessment & Plan Note (Addendum)
 Chronic Not controlled Currently taking valsartan  160 mg daily, torsemide 20 mg daily, spironolactone  25 mg daily, metoprolol  XL 100 mg daily, Imdur 30 mg daily and hydralazine  50 mg 3 times a day Started on valsartan  the end of last week so I do not think she has been on this long enough to know the full effect No changes in blood pressure medication today Monitor blood pressure at home and keep log-bring BP cuff for next visit Follow-up in 2 weeks

## 2024-03-16 NOTE — Patient Instructions (Addendum)
      Medications changes include :   None     Return in about 2 weeks (around 03/30/2024).

## 2024-03-16 NOTE — Assessment & Plan Note (Signed)
 Acute Had a blister secondary to leg edema that popped Blister looks clean without infection Wrapped after applying antibacterial ointment-advised her to do the same

## 2024-03-16 NOTE — Assessment & Plan Note (Signed)
 Chronic Not ideally controlled-has gotten worse recently and possibly related to wearing an abdominal binder after panniculectomy Elevates her legs at night, but does not always elevate her legs during the day when sitting-stressed elevating her legs whenever she sits Encourage regular walking Low-sodium diet Next week has a follow-up with surgery-advised to ask them if she has to wear the abdominal binder for an additional 4-5 weeks or not Continue torsemide 20 mg daily

## 2024-03-16 NOTE — Assessment & Plan Note (Signed)
 Chronic Lab Results  Component Value Date   LDLCALC 76 05/29/2023   Continue Crestor  20 mg daily

## 2024-03-16 NOTE — Assessment & Plan Note (Signed)
 Chronic Recent CT CAC 1484-evidence of severe stenosis-referred to cardiology and will likely need a catheterization Echocardiogram ordered-not yet scheduled Asymptomatic Continue current medications

## 2024-03-18 MED ORDER — HYDRALAZINE HCL 50 MG PO TABS: 50.0000 mg | ORAL_TABLET | Freq: Three times a day (TID) | ORAL | 0 refills | Status: DC

## 2024-03-18 NOTE — Patient Instructions (Signed)
 It was a pleasure speaking with you today!  Stop losartan , start valsartan  320 mg 1/2 tablet daily.   Feel free to call with any questions or concerns!  Darrelyn Drum, PharmD, BCPS, CPP Clinical Pharmacist Practitioner [facility] [phone]

## 2024-03-22 NOTE — Progress Notes (Unsigned)
 Cardiology Office Note:    Date:  03/23/2024   ID:  Rebekah Kennedy, DOB [birth year], MRN [id number]  PCP:  Geofm Glade PARAS, MD   [facility] Providers Cardiologist:  None     Referring MD: Geofm Glade PARAS, MD   Chief Complaint  Patient presents with   Coronary Artery Disease    History of Present Illness:    Rebekah Kennedy is a 68 y.o. female seen at the request of Dr Joshua for evaluation of CAD. She has a history of DM, HTN, HLD, TIAs.  Also history of hemorrhagic stroke and obesity. Echo in 2020 showed mild LVH with normal EF and normal valves. Was seen recently with some leg edema. Troponin was tested and was 46 and 32. Coronary CTA was done with calcium  score of 1484. Multivessel obstructive CAD.   She recently underwent panniculectomy in September without complications. Later developed a blister on her right leg and swelling bilaterally leading to above evaluation. Does notes some improvement in edema on torsemide  She denies any symptoms of chest pain or pressure. Notes SOB when weather is damp which she attributes to asthma. She is fairly sedentary but does her own housework and drives.   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 issuse resolved   Retinal edema    gets steriod inj in the eyes     Sleep apnea    not needed since 100  pound weight loss 2019-2020   Splenomegaly    in college   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   left side pt states, left wide weak per pt   Stroke [facility] 2012   right pontine cva with left hemiparesis    Past Surgical History:  Procedure Laterality Date   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   APPENDECTOMY     yrs ago per pt 0n 11-29-2020   BIOPSY THYROID   05/02/2011   Nonneoplastic goiter   BREAST BIOPSY     BREAST LUMPECTOMY WITH RADIOACTIVE SEED LOCALIZATION Left 07/25/2017   Procedure: LEFT BREAST LUMPECTOMY WITH RADIOACTIVE SEED LOCALIZATION;  Surgeon: Vernetta Berg, MD;  Location: [facility] OR;  Service: General;  Laterality: Left;   BREAST SURGERY     Reduction   cataracts Bilateral 2012   COLONOSCOPY  2021   COLPOSCOPY     DILATATION & CURETTAGE/HYSTEROSCOPY WITH MYOSURE N/A 12/02/2020   Procedure: DILATATION & CURETTAGE/HYSTEROSCOPY WITH MYOSURE;  Surgeon: Lavoie, Marie-Lyne, MD;  Location: [facility];  Service: Gynecology;  Laterality: N/A;   DILATION AND CURETTAGE OF UTERUS  1975   DUB   EYE SURGERY     Laser for vision at times both eyes done   GASTRIC ROUX-EN-Y N/A  08/12/2017   Procedure: LAPAROSCOPIC ROUX-EN-Y GASTRIC BYPASS WITH HIATAL HERNIA REPAIR AND UPPER ENDOSCOPY;  Surgeon: [name], Herlene Righter, MD;  Location: [facility];  Service: General;  Laterality: N/A;   GYNECOLOGIC CRYOSURGERY     MYOMECTOMY     OVARIAN CYST REMOVAL     PANNICULECTOMY N/A 02/03/2024   Procedure: PANNICULECTOMY;  Surgeon: Waddell Leonce NOVAK, MD;  Location: [facility];  Service: Plastics;  Laterality: N/A;   PELVIC LAPAROSCOPY     DL lysis of adhesions 8024 and 1988    Current Medications: Current Meds  Medication Sig   acetaminophen  (TYLENOL ) 500 MG tablet Take 1 tablet (500 mg total) by mouth every 6 (six) hours as needed.   albuterol  (PROVENTIL  HFA;VENTOLIN  HFA) 108 (90 Base) MCG/ACT inhaler Inhale 2 puffs into the lungs every 4 (four) hours as needed for wheezing  or shortness of breath.   aspirin  EC 81 MG tablet Take 1 tablet (81 mg total) by mouth daily.   Biotin  5000 MCG TABS Take 10,000 mcg by mouth daily.    Cholecalciferol (VITAMIN D3 PO) Take 2,000 Units by mouth daily.   clopidogrel  (PLAVIX ) 75 MG tablet TAKE 1 TABLET BY MOUTH EVERY DAY   Continuous Glucose Receiver (FREESTYLE LIBRE 3 READER) DEVI Use to monitor glucose continuously.   Continuous Glucose Sensor (FREESTYLE LIBRE 3 PLUS SENSOR) MISC 1 each by Does not apply route every 14 (fourteen) days.   dapagliflozin  propanediol (FARXIGA ) 10 MG TABS tablet Take 1 tablet (10 mg total) by mouth daily before breakfast.   hydrALAZINE  (APRESOLINE ) 50 MG tablet Take 1 tablet (50 mg total) by mouth 3 (three) times daily.   isosorbide mononitrate (IMDUR) 30 MG 24 hr tablet Take 1 tablet (30 mg total) by mouth daily.   metoprolol  succinate (TOPROL -XL) 100 MG 24 hr tablet TAKE 1 TABLET BY MOUTH DAILY WITH OR IMMEDIATELY FOLLOWING A MEAL   montelukast  (SINGULAIR ) 10 MG tablet TAKE 1 TABLET BY MOUTH EVERY DAY   nystatin -triamcinolone  ointment (MYCOLOG) Apply topically as needed.   ondansetron  (ZOFRAN -ODT) 4 MG disintegrating tablet Take 1 tablet (4 mg total) by mouth every 8 (eight) hours as needed for nausea or vomiting.   OVER THE COUNTER MEDICATION Take 1 tablet by mouth daily. Multivitamin centrum 1 daily   predniSONE (DELTASONE) 20 MG tablet Take 2 tablets (40 mg total) by mouth daily with breakfast for 21 days.   rosuvastatin  (CRESTOR ) 20 MG tablet Take 1 tablet (20 mg total) by mouth daily.   Semaglutide , 1 MG/DOSE, (OZEMPIC , 1 MG/DOSE,) 4 MG/3ML SOPN Inject 1 mg into the skin once a week.   spironolactone  (ALDACTONE ) 25 MG tablet Take 1 tablet (25 mg total) by mouth daily.   tacrolimus  (PROTOPIC ) 0.1 % ointment Apply topically 2 (two) times daily. (Patient taking differently: Apply 1 Application topically daily as needed (blisters).)   torsemide (DEMADEX) 20 MG tablet Take 1 tablet (20 mg total) by  mouth daily.   valsartan  (DIOVAN ) 320 MG tablet Take 0.5 tablets (160 mg total) by mouth daily.     Allergies:   Amlodipine , Quinapril, and Metformin and related   Social History   Socioeconomic History   Marital status: Single    Spouse name: Not on file   Number of children: 2   Years of education: 16   Highest education level: Master's degree (e.g., MA, MS, MEng, MEd, MSW, MBA)  Occupational History   Occupation: TEACHER    Employer: [employer]	[place]  Tobacco Use   Smoking status:  Never   Smokeless tobacco: Never  Vaping Use   Vaping status: Never Used  Substance and Sexual Activity   Alcohol  use: Yes    Alcohol /week: 1.0 standard drink of alcohol     Types: 1 Glasses of wine per week    Comment: occas   Drug use: Never   Sexual activity: Not Currently    Birth control/protection: Post-menopausal    Comment: older than 17, less than 5  Other Topics Concern   Not on file  Social History Narrative   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   Living with daughter      Right handed      4 steps inside of the home      Social Drivers of Health   Financial Resource Strain: Low Risk  (03/16/2024)   Overall Financial Resource Strain (CARDIA)    Difficulty of Paying Living Expenses: Not hard at all  Food Insecurity: No Food Insecurity (03/16/2024)   Hunger Vital Sign    Worried About Running Out of Food in the Last Year: Never true    Ran Out of Food in the Last Year: Never true  Transportation Needs: No Transportation Needs (03/16/2024)   PRAPARE - Administrator, Civil Service (Medical): No    Lack of Transportation (Non-Medical): No  Physical Activity: Insufficiently Active (03/16/2024)   Exercise Vital Sign    Days of Exercise per Week: 2 days    Minutes of Exercise per Session: 40 min  Stress: No Stress Concern Present (03/16/2024)   [employer] of Occupational Health - Occupational  Stress Questionnaire    Feeling of Stress: Not at all  Social Connections: Moderately Integrated (03/16/2024)   Social Connection and Isolation Panel    Frequency of Communication with Friends and Family: More than three times a week    Frequency of Social Gatherings with Friends and Family: More than three times a week    Attends Religious Services: More than 4 times per year    Active Member of [employer] or Organizations: Yes    Attends Engineer, Structural: More than 4 times per year    Marital Status: Never married  Recent Concern: Social Connections - Moderately Isolated (01/24/2024)   Social Connection and Isolation Panel    Frequency of Communication with Friends and Family: More than three times a week    Frequency of Social Gatherings with Friends and Family: Three times a week    Attends Religious Services: More than 4 times per year    Active Member of Clubs or Organizations: No    Attends Engineer, Structural: Not on file    Marital Status: Never married     Family History: The patient's family history includes Alzheimer's disease in her paternal grandmother; Asthma in her brother, maternal grandmother, and mother; Breast cancer (age of onset: 31) in her sister; COPD in her mother; Cancer in her maternal grandfather, maternal grandmother, and mother; Colon cancer in her maternal grandmother; Diabetes in her brother, brother, father, and sister; Yvone' disease in her paternal grandmother and sister; Heart disease in her father, mother, and paternal grandmother; Heart failure in her brother, brother, and brother; Hyperlipidemia in her father and mother; Hypertension in her brother, brother, brother, father, mother, and paternal grandmother; Kidney disease in her brother; Ovarian cancer in her mother; Prostate cancer in her brother and brother; Stroke in her brother, brother, and father.  ROS:   Please see the history of present illness.     All other systems reviewed  and are negative.  EKGs/Labs/Other Studies Reviewed:    The following studies were reviewed today: Echo 07/08/18: IMPRESSIONS     1. The left ventricle has normal systolic function with an ejection  fraction of 60-65%. The cavity size was normal. There is mild concentric  left ventricular hypertrophy. Left ventricular diastolic Doppler  parameters are indeterminate No evidence of  left ventricular regional wall motion abnormalities.   2. No intracardiac thrombi or masses were visualized.   3. The right ventricle has normal systolic function. The cavity was  normal. There is no increase in right ventricular wall thickness. Right  ventricular systolic pressure could not be assessed.   4. The mitral valve is normal in structure.   5. The tricuspid valve is normal in structure.   6. The aortic valve is tricuspid Mild thickening of the aortic valve Mild  calcification of the aortic valve.   7. The pulmonic valve was normal in structure.   8. No evidence of left ventricular regional wall motion abnormalities.   9. Right atrial pressure is estimated at 3 mmHg.   Coronary CTA 03/12/24: Cardiac CTA   MEDICATIONS: Sub lingual nitro. 4mg  x 2   TECHNIQUE: A non-contrast, gated CT scan was obtained with axial slices of 2.5 mm through the heart for calcium  scoring. Calcium  scoring was performed using the Agatston method. A 120 kV prospective, gated, contrast cardiac CT scan was obtained. Gantry rotation speed was 230 msec and collimation was 0.63 mm. Two sublingual nitroglycerin tablets (0.8 mg) were given. The 3D data set was reconstructed with motion correction for the best systolic or diastolic phase. Images were analyzed on a dedicated workstation using MPR, MIP, and VRT modes. The patient received 95 cc contrast.   FINDINGS: Non-cardiac: See separate report from [facility].   Normal caliber aortic root and ascending aorta. No LA appendage thrombus. Pulmonary veins drain  normally to the left atrium.   Calcium  Score: 1484 AU   Coronary Arteries: Right dominant with no anomalies   LM: Mixed plaque with mild (1-24%) stenosis.   LAD system: Moderate D1 with mixed plaque proximally, severe (70-99%) stenosis. There is extensive mixed plaque in the proximal to mid LAD, there appears to be diffuse moderate (50-69%) stenosis. FFR 0.68 in the distal LAD suggests that the disease in the proximal to mid LAD is hemodynamically significant.   Circumflex system: Relatively small LCx appears severely stenosed (70-99%) in the mid vessel. CT FFR 0.71 in the distal LCx.   RCA system: Extensive calcified plaque in the proximal RCA, mild (25-49%) stenosis. Mixed plaque distal LAD with severe (70-99%) stenosis. CT FFR 0.58 beyond the distal RCA stenosis.   IMPRESSION: 1. Coronary artery calcium  score 1484 AU. This places the patient in the 99th percentile for age and gender, suggesting high risk for future cardiac events.   2.  Severe stenosis in the distal RCA confirmed by CT FFR.   3. Severe stenosis in the mid LCx (small vessel) confirmed by CT FFR.   4.  Severe stenosis proximal D1.   5. Moderate stenosis diffusely in the proximal to mid LAD, the totality of this disease appears hemodynamically significant by CT FFR in the distal LAD.   Recommend evaluation by cardiac catheterization.   [employer] Account Executive   Electronically Signed: By: Ezra Shuck M.D. On: 03/12/2024 21:25        Recent Labs:  01/28/2024: BUN 14; Potassium 3.6; Sodium 142 03/02/2024: ALT 13; Hemoglobin 11.8; Platelets 280.0; Pro B Natriuretic peptide (BNP) 40.0; TSH 1.32 03/12/2024: Creatinine, Ser 0.70  Recent Lipid Panel    Component Value Date/Time   CHOL 164 05/29/2023 0923   TRIG 72.0 05/29/2023 0923   HDL 73.70 05/29/2023 0923   CHOLHDL 2 05/29/2023 0923   VLDL 14.4 05/29/2023 0923   LDLCALC 76 05/29/2023 0923   LDLDIRECT 126.7 03/24/2013 1521     Risk  Assessment/Calculations:                Physical Exam:    VS:  BP 131/75   Pulse 100   Ht 5' 3 (1.6 m)   Wt 132 lb 11.2 oz (60.2 kg)   SpO2 100%   BMI 23.51 kg/m     Wt Readings from Last 3 Encounters:  03/23/24 132 lb 11.2 oz (60.2 kg)  03/16/24 141 lb (64 kg)  03/02/24 145 lb 12.8 oz (66.1 kg)    BP 128/70 in both arms.  GEN:  Well nourished, well developed in no acute distress HEENT: Normal NECK: No JVD; No carotid bruits LYMPHATICS: No lymphadenopathy CARDIAC: RRR, no murmurs, rubs, gallops. Left radial pulse is poor. Femoral pulses 2+ no bruits.  RESPIRATORY:  Clear to auscultation without rales, wheezing or rhonchi  ABDOMEN: Soft, non-tender, non-distended MUSCULOSKELETAL:  1+ edema; No deformity, blister has healed. SKIN: Warm and dry NEUROLOGIC:  Alert and oriented x 3 PSYCHIATRIC:  Normal affect   ASSESSMENT:    1. Coronary artery disease involving native coronary artery of native heart without angina pectoris   2. Other hyperlipidemia   3. Pre-procedure lab exam   4. Type 2 diabetes mellitus without complication, without long-term current use of insulin  (HCC)   5. Primary hypertension    PLAN:    In order of problems listed above:  CAD with high risk coronary CTA demonstrating multivessel obstructive disease. Remarkably no significant symptoms but is on good medical therapy. Given high risk scan would recommend cardiac cath to further assess anatomy. The procedure and risks were reviewed including but not limited to death, myocardial infarction, stroke, arrythmias, bleeding, transfusion, emergency surgery, dye allergy, or renal dysfunction. The patient voices understanding and is agreeable to proceed. HTN controlled DM type 2 HLD on statin. Last LDL 73. Will update LE edema. Will check Echo. Pro BNP is normal. Continue diuretics       Informed Consent   Shared Decision Making/Informed Consent The risks [stroke (1 in 1000), death (1 in 1000), kidney  failure [usually temporary] (1 in 500), bleeding (1 in 200), allergic reaction [possibly serious] (1 in 200)], benefits (diagnostic support and management of coronary artery disease) and alternatives of a cardiac catheterization were discussed in detail with Rebekah Kennedy and she is willing to proceed.       Medication Adjustments/Labs and Tests Ordered: Current medicines are reviewed at length with the patient today.  Concerns regarding medicines are outlined above.  Orders Placed This Encounter  Procedures   Lipid panel   Comp Met (CMET)   CBC w/Diff   ECHOCARDIOGRAM COMPLETE   No orders of the defined types were placed in this encounter.   Patient Instructions  Medication Instructions:  Continue same medications *If you need a refill on your cardiac medications before your next appointment, please call your pharmacy*  Lab Work: Fasting lab cmet,lipid panel,cbc to be done Wed 11/5  Testing/Procedures: Echo  first available    Follow-Up: At [facility], you and your health needs are our priority.  As part of our continuing mission to provide you with exceptional heart care, our providers are all part of one team.  This team includes your primary Cardiologist (physician) and Advanced Practice Providers or APPs (Physician Assistants and Nurse Practitioners) who all work together to provide you with the care you need, when you need it.  Your next appointment:   After Cath    Provider:  Dr.Quinette Hentges         [facility] A DEPT OF [facility] [address] A DEPT OF THE [facility] [address] [place] [address] Dept: [phone] Loc: [phone]  IYANNA DRUMMER  03/23/2024  You are scheduled for a Cardiac Catheterization on Wednesday, November 12 with Dr. Evander Macaraeg.  1. Please arrive at the [facility] (Main Entrance A) at [facility]: [address] [place], [place] [address] at 6:30 AM (This  time is 2 hour(s) before your procedure to ensure your preparation).   Free valet parking service is available. You will check in at ADMITTING. The support person will be asked to wait in the waiting room.  It is OK to have someone drop you off and come back when you are ready to be discharged.    Special note: Every effort is made to have your procedure done on time. Please understand that emergencies sometimes delay scheduled procedures.  2. Diet: Nothing to eat after midnight.   3. Hydration: You need to be well hydrated before your procedure. On November 12, you may drink approved liquids (see below) until 2 hours before the procedure, with 16 oz of water as your last intake.   List of approved liquids water, clear juice, clear tea, black coffee, fruit juices, non-citric and without pulp, carbonated beverages, Gatorade, Kool -Aid, plain Jello-O and plain ice popsicles.  4. Labs: You will need to have blood drawn on Wednesday, November 5 at [facility] (1st Floor), [address] [place], [place] [address].   5. Medication instructions in preparation for your procedure:     Hold Ozempic  7 day before cath   [facility]  Wed 11/5  Hold Farxiga  3 days before cath   [facility] 11/9  Hold Torsemide morning of cath             On the morning of your procedure, take your Aspirin  81 mg and Plavix /Clopidogrel  and any morning medicines NOT listed above.  You may use sips of water.  6. Plan to go home the same day, you will only stay overnight if medically necessary. 7. Bring a current list of your medications and current insurance cards. 8. You MUST have a responsible person to drive you home. 9. Someone MUST be with you the first 24 hours after you arrive home or your discharge will be delayed. 10. Please wear clothes that are easy to get on and off and wear slip-on shoes.  Thank you for allowing us  to care for you!   --  [facility]  We recommend signing up for the patient portal called MyChart.  Sign up information is provided on this After Visit Summary.  MyChart is used to connect with patients for Virtual Visits (Telemedicine).  Patients are able to view lab/test results, encounter notes, upcoming appointments, etc.  Non-urgent messages can be sent to  your provider as well.   To learn more about what you can do with MyChart, go to [email or web address].         Signed, Arvin Abello, MD  03/23/2024 10:21 AM    [facility]

## 2024-03-22 NOTE — H&P (View-Only) (Signed)
 Cardiology Office Note:    Date:  03/23/2024   ID:  Rebekah Kennedy, DOB 1955-08-23, MRN [id number]  PCP:  Geofm Glade PARAS, MD   [facility] Providers Cardiologist:  None     Referring MD: Geofm Glade PARAS, MD   Chief Complaint  Patient presents with   Coronary Artery Disease    History of Present Illness:    Rebekah Kennedy is a 68 y.o. female seen at the request of Dr Joshua for evaluation of CAD. She has a history of DM, HTN, HLD, TIAs.  Also history of hemorrhagic stroke and obesity. Echo in 2020 showed mild LVH with normal EF and normal valves. Was seen recently with some leg edema. Troponin was tested and was 46 and 32. Coronary CTA was done with calcium  score of 1484. Multivessel obstructive CAD.   She recently underwent panniculectomy in September without complications. Later developed a blister on her right leg and swelling bilaterally leading to above evaluation. Does notes some improvement in edema on torsemide  She denies any symptoms of chest pain or pressure. Notes SOB when weather is damp which she attributes to asthma. She is fairly sedentary but does her own housework and drives.   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 issuse resolved   Retinal edema    gets steriod inj in the eyes     Sleep apnea    not needed since 100  pound weight loss 2019-2020   Splenomegaly    in college   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   left side pt states, left wide weak per pt   Stroke [facility]) 2012   right pontine cva with left hemiparesis    Past Surgical History:  Procedure Laterality Date   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   APPENDECTOMY     yrs ago per pt 0n 11-29-2020   BIOPSY THYROID   05/02/2011   Nonneoplastic goiter   BREAST BIOPSY     BREAST LUMPECTOMY WITH RADIOACTIVE SEED LOCALIZATION Left 07/25/2017   Procedure: LEFT BREAST LUMPECTOMY WITH RADIOACTIVE SEED LOCALIZATION;  Surgeon: Vernetta Berg, MD;  Location: [facility] OR;  Service: General;  Laterality: Left;   BREAST SURGERY     Reduction   cataracts Bilateral 2012   COLONOSCOPY  2021   COLPOSCOPY     DILATATION & CURETTAGE/HYSTEROSCOPY WITH MYOSURE N/A 12/02/2020   Procedure: DILATATION & CURETTAGE/HYSTEROSCOPY WITH MYOSURE;  Surgeon: Lavoie, Marie-Lyne, MD;  Location:  SURGERY CENTER;  Service: Gynecology;  Laterality: N/A;   DILATION AND CURETTAGE OF UTERUS  1975   DUB   EYE SURGERY     Laser for vision at times both eyes done   GASTRIC ROUX-EN-Y N/A  08/12/2017   Procedure: LAPAROSCOPIC ROUX-EN-Y GASTRIC BYPASS WITH HIATAL HERNIA REPAIR AND UPPER ENDOSCOPY;  Surgeon: [name], Herlene Righter, MD;  Location: [facility] ORS;  Service: General;  Laterality: N/A;   GYNECOLOGIC CRYOSURGERY     MYOMECTOMY     OVARIAN CYST REMOVAL     PANNICULECTOMY N/A 02/03/2024   Procedure: PANNICULECTOMY;  Surgeon: Waddell Leonce NOVAK, MD;  Location:  SURGERY CENTER;  Service: Plastics;  Laterality: N/A;   PELVIC LAPAROSCOPY     DL lysis of adhesions 8024 and 1988    Current Medications: Current Meds  Medication Sig   acetaminophen  (TYLENOL ) 500 MG tablet Take 1 tablet (500 mg total) by mouth every 6 (six) hours as needed.   albuterol  (PROVENTIL  HFA;VENTOLIN  HFA) 108 (90 Base) MCG/ACT inhaler Inhale 2 puffs into the lungs every 4 (four) hours as needed for wheezing  or shortness of breath.   aspirin  EC 81 MG tablet Take 1 tablet (81 mg total) by mouth daily.   Biotin  5000 MCG TABS Take 10,000 mcg by mouth daily.    Cholecalciferol (VITAMIN D3 PO) Take 2,000 Units by mouth daily.   clopidogrel  (PLAVIX ) 75 MG tablet TAKE 1 TABLET BY MOUTH EVERY DAY   Continuous Glucose Receiver (FREESTYLE LIBRE 3 READER) DEVI Use to monitor glucose continuously.   Continuous Glucose Sensor (FREESTYLE LIBRE 3 PLUS SENSOR) MISC 1 each by Does not apply route every 14 (fourteen) days.   dapagliflozin  propanediol (FARXIGA ) 10 MG TABS tablet Take 1 tablet (10 mg total) by mouth daily before breakfast.   hydrALAZINE  (APRESOLINE ) 50 MG tablet Take 1 tablet (50 mg total) by mouth 3 (three) times daily.   isosorbide mononitrate (IMDUR) 30 MG 24 hr tablet Take 1 tablet (30 mg total) by mouth daily.   metoprolol  succinate (TOPROL -XL) 100 MG 24 hr tablet TAKE 1 TABLET BY MOUTH DAILY WITH OR IMMEDIATELY FOLLOWING A MEAL   montelukast  (SINGULAIR ) 10 MG tablet TAKE 1 TABLET BY MOUTH EVERY DAY   nystatin -triamcinolone  ointment (MYCOLOG) Apply topically as needed.   ondansetron  (ZOFRAN -ODT) 4 MG disintegrating tablet Take 1 tablet (4 mg total) by mouth every 8 (eight) hours as needed for nausea or vomiting.   OVER THE COUNTER MEDICATION Take 1 tablet by mouth daily. Multivitamin centrum 1 daily   predniSONE (DELTASONE) 20 MG tablet Take 2 tablets (40 mg total) by mouth daily with breakfast for 21 days.   rosuvastatin  (CRESTOR ) 20 MG tablet Take 1 tablet (20 mg total) by mouth daily.   Semaglutide , 1 MG/DOSE, (OZEMPIC , 1 MG/DOSE,) 4 MG/3ML SOPN Inject 1 mg into the skin once a week.   spironolactone  (ALDACTONE ) 25 MG tablet Take 1 tablet (25 mg total) by mouth daily.   tacrolimus  (PROTOPIC ) 0.1 % ointment Apply topically 2 (two) times daily. (Patient taking differently: Apply 1 Application topically daily as needed (blisters).)   torsemide (DEMADEX) 20 MG tablet Take 1 tablet (20 mg total) by  mouth daily.   valsartan  (DIOVAN ) 320 MG tablet Take 0.5 tablets (160 mg total) by mouth daily.     Allergies:   Amlodipine , Quinapril, and Metformin and related   Social History   Socioeconomic History   Marital status: Single    Spouse name: Not on file   Number of children: 2   Years of education: 16   Highest education level: Master's degree (e.g., MA, MS, MEng, MEd, MSW, MBA)  Occupational History   Occupation: TEACHER    Employer: [employer]	[place]  Tobacco Use   Smoking status:  Never   Smokeless tobacco: Never  Vaping Use   Vaping status: Never Used  Substance and Sexual Activity   Alcohol  use: Yes    Alcohol /week: 1.0 standard drink of alcohol     Types: 1 Glasses of wine per week    Comment: occas   Drug use: Never   Sexual activity: Not Currently    Birth control/protection: Post-menopausal    Comment: older than 17, less than 5  Other Topics Concern   Not on file  Social History Narrative   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   Living with daughter      Right handed      4 steps inside of the home      Social Drivers of Health   Financial Resource Strain: Low Risk  (03/16/2024)   Overall Financial Resource Strain (CARDIA)    Difficulty of Paying Living Expenses: Not hard at all  Food Insecurity: No Food Insecurity (03/16/2024)   Hunger Vital Sign    Worried About Running Out of Food in the Last Year: Never true    Ran Out of Food in the Last Year: Never true  Transportation Needs: No Transportation Needs (03/16/2024)   PRAPARE - Administrator, Civil Service (Medical): No    Lack of Transportation (Non-Medical): No  Physical Activity: Insufficiently Active (03/16/2024)   Exercise Vital Sign    Days of Exercise per Week: 2 days    Minutes of Exercise per Session: 40 min  Stress: No Stress Concern Present (03/16/2024)   [employer] of Occupational Health - Occupational  Stress Questionnaire    Feeling of Stress: Not at all  Social Connections: Moderately Integrated (03/16/2024)   Social Connection and Isolation Panel    Frequency of Communication with Friends and Family: More than three times a week    Frequency of Social Gatherings with Friends and Family: More than three times a week    Attends Religious Services: More than 4 times per year    Active Member of [employer] or Organizations: Yes    Attends Engineer, Structural: More than 4 times per year    Marital Status: Never married  Recent Concern: Social Connections - Moderately Isolated (01/24/2024)   Social Connection and Isolation Panel    Frequency of Communication with Friends and Family: More than three times a week    Frequency of Social Gatherings with Friends and Family: Three times a week    Attends Religious Services: More than 4 times per year    Active Member of Clubs or Organizations: No    Attends Engineer, Structural: Not on file    Marital Status: Never married     Family History: The patient's family history includes Alzheimer's disease in her paternal grandmother; Asthma in her brother, maternal grandmother, and mother; Breast cancer (age of onset: 37) in her sister; COPD in her mother; Cancer in her maternal grandfather, maternal grandmother, and mother; Colon cancer in her maternal grandmother; Diabetes in her brother, brother, father, and sister; Yvone' disease in her paternal grandmother and sister; Heart disease in her father, mother, and paternal grandmother; Heart failure in her brother, brother, and brother; Hyperlipidemia in her father and mother; Hypertension in her brother, brother, brother, father, mother, and paternal grandmother; Kidney disease in her brother; Ovarian cancer in her mother; Prostate cancer in her brother and brother; Stroke in her brother, brother, and father.  ROS:   Please see the history of present illness.     All other systems reviewed  and are negative.  EKGs/Labs/Other Studies Reviewed:    The following studies were reviewed today: Echo 07/08/18: IMPRESSIONS     1. The left ventricle has normal systolic function with an ejection  fraction of 60-65%. The cavity size was normal. There is mild concentric  left ventricular hypertrophy. Left ventricular diastolic Doppler  parameters are indeterminate No evidence of  left ventricular regional wall motion abnormalities.   2. No intracardiac thrombi or masses were visualized.   3. The right ventricle has normal systolic function. The cavity was  normal. There is no increase in right ventricular wall thickness. Right  ventricular systolic pressure could not be assessed.   4. The mitral valve is normal in structure.   5. The tricuspid valve is normal in structure.   6. The aortic valve is tricuspid Mild thickening of the aortic valve Mild  calcification of the aortic valve.   7. The pulmonic valve was normal in structure.   8. No evidence of left ventricular regional wall motion abnormalities.   9. Right atrial pressure is estimated at 3 mmHg.   Coronary CTA 03/12/24: Cardiac CTA   MEDICATIONS: Sub lingual nitro. 4mg  x 2   TECHNIQUE: A non-contrast, gated CT scan was obtained with axial slices of 2.5 mm through the heart for calcium  scoring. Calcium  scoring was performed using the Agatston method. A 120 kV prospective, gated, contrast cardiac CT scan was obtained. Gantry rotation speed was 230 msec and collimation was 0.63 mm. Two sublingual nitroglycerin tablets (0.8 mg) were given. The 3D data set was reconstructed with motion correction for the best systolic or diastolic phase. Images were analyzed on a dedicated workstation using MPR, MIP, and VRT modes. The patient received 95 cc contrast.   FINDINGS: Non-cardiac: See separate report from [facility].   Normal caliber aortic root and ascending aorta. No LA appendage thrombus. Pulmonary veins drain  normally to the left atrium.   Calcium  Score: 1484 AU   Coronary Arteries: Right dominant with no anomalies   LM: Mixed plaque with mild (1-24%) stenosis.   LAD system: Moderate D1 with mixed plaque proximally, severe (70-99%) stenosis. There is extensive mixed plaque in the proximal to mid LAD, there appears to be diffuse moderate (50-69%) stenosis. FFR 0.68 in the distal LAD suggests that the disease in the proximal to mid LAD is hemodynamically significant.   Circumflex system: Relatively small LCx appears severely stenosed (70-99%) in the mid vessel. CT FFR 0.71 in the distal LCx.   RCA system: Extensive calcified plaque in the proximal RCA, mild (25-49%) stenosis. Mixed plaque distal LAD with severe (70-99%) stenosis. CT FFR 0.58 beyond the distal RCA stenosis.   IMPRESSION: 1. Coronary artery calcium  score 1484 AU. This places the patient in the 99th percentile for age and gender, suggesting high risk for future cardiac events.   2.  Severe stenosis in the distal RCA confirmed by CT FFR.   3. Severe stenosis in the mid LCx (small vessel) confirmed by CT FFR.   4.  Severe stenosis proximal D1.   5. Moderate stenosis diffusely in the proximal to mid LAD, the totality of this disease appears hemodynamically significant by CT FFR in the distal LAD.   Recommend evaluation by cardiac catheterization.   [employer] Account Executive   Electronically Signed: By: Ezra Shuck M.D. On: 03/12/2024 21:25        Recent Labs:  01/28/2024: BUN 14; Potassium 3.6; Sodium 142 03/02/2024: ALT 13; Hemoglobin 11.8; Platelets 280.0; Pro B Natriuretic peptide (BNP) 40.0; TSH 1.32 03/12/2024: Creatinine, Ser 0.70  Recent Lipid Panel    Component Value Date/Time   CHOL 164 05/29/2023 0923   TRIG 72.0 05/29/2023 0923   HDL 73.70 05/29/2023 0923   CHOLHDL 2 05/29/2023 0923   VLDL 14.4 05/29/2023 0923   LDLCALC 76 05/29/2023 0923   LDLDIRECT 126.7 03/24/2013 1521     Risk  Assessment/Calculations:                Physical Exam:    VS:  BP 131/75   Pulse 100   Ht 5' 3 (1.6 m)   Wt 132 lb 11.2 oz (60.2 kg)   SpO2 100%   BMI 23.51 kg/m     Wt Readings from Last 3 Encounters:  03/23/24 132 lb 11.2 oz (60.2 kg)  03/16/24 141 lb (64 kg)  03/02/24 145 lb 12.8 oz (66.1 kg)    BP 128/70 in both arms.  GEN:  Well nourished, well developed in no acute distress HEENT: Normal NECK: No JVD; No carotid bruits LYMPHATICS: No lymphadenopathy CARDIAC: RRR, no murmurs, rubs, gallops. Left radial pulse is poor. Femoral pulses 2+ no bruits.  RESPIRATORY:  Clear to auscultation without rales, wheezing or rhonchi  ABDOMEN: Soft, non-tender, non-distended MUSCULOSKELETAL:  1+ edema; No deformity, blister has healed. SKIN: Warm and dry NEUROLOGIC:  Alert and oriented x 3 PSYCHIATRIC:  Normal affect   ASSESSMENT:    1. Coronary artery disease involving native coronary artery of native heart without angina pectoris   2. Other hyperlipidemia   3. Pre-procedure lab exam   4. Type 2 diabetes mellitus without complication, without long-term current use of insulin  (HCC)   5. Primary hypertension    PLAN:    In order of problems listed above:  CAD with high risk coronary CTA demonstrating multivessel obstructive disease. Remarkably no significant symptoms but is on good medical therapy. Given high risk scan would recommend cardiac cath to further assess anatomy. The procedure and risks were reviewed including but not limited to death, myocardial infarction, stroke, arrythmias, bleeding, transfusion, emergency surgery, dye allergy, or renal dysfunction. The patient voices understanding and is agreeable to proceed. HTN controlled DM type 2 HLD on statin. Last LDL 73. Will update LE edema. Will check Echo. Pro BNP is normal. Continue diuretics       Informed Consent   Shared Decision Making/Informed Consent The risks [stroke (1 in 1000), death (1 in 1000), kidney  failure [usually temporary] (1 in 500), bleeding (1 in 200), allergic reaction [possibly serious] (1 in 200)], benefits (diagnostic support and management of coronary artery disease) and alternatives of a cardiac catheterization were discussed in detail with Rebekah Kennedy and she is willing to proceed.       Medication Adjustments/Labs and Tests Ordered: Current medicines are reviewed at length with the patient today.  Concerns regarding medicines are outlined above.  Orders Placed This Encounter  Procedures   Lipid panel   Comp Met (CMET)   CBC w/Diff   ECHOCARDIOGRAM COMPLETE   No orders of the defined types were placed in this encounter.   Patient Instructions  Medication Instructions:  Continue same medications *If you need a refill on your cardiac medications before your next appointment, please call your pharmacy*  Lab Work: Fasting lab cmet,lipid panel,cbc to be done Wed 11/5  Testing/Procedures: Echo  first available    Follow-Up: At [facility], you and your health needs are our priority.  As part of our continuing mission to provide you with exceptional heart care, our providers are all part of one team.  This team includes your primary Cardiologist (physician) and Advanced Practice Providers or APPs (Physician Assistants and Nurse Practitioners) who all work together to provide you with the care you need, when you need it.  Your next appointment:   After Cath    Provider:  Dr.Xiong Haidar         [facility] DEPT OF [place].  [facility] AT [address] A DEPT OF THE [facility]. [facility] [address] [place] [address] Dept: [phone] Loc: [phone]  JERI JEANBAPTISTE  03/23/2024  You are scheduled for a Cardiac Catheterization on Wednesday, November 12 with Dr. Yoselyn Mcglade.  1. Please arrive at the [facility] (Main Entrance A) at [facility]: [address] [place], [place] [address] at 6:30 AM (This  time is 2 hour(s) before your procedure to ensure your preparation).   Free valet parking service is available. You will check in at ADMITTING. The support person will be asked to wait in the waiting room.  It is OK to have someone drop you off and come back when you are ready to be discharged.    Special note: Every effort is made to have your procedure done on time. Please understand that emergencies sometimes delay scheduled procedures.  2. Diet: Nothing to eat after midnight.   3. Hydration: You need to be well hydrated before your procedure. On November 12, you may drink approved liquids (see below) until 2 hours before the procedure, with 16 oz of water as your last intake.   List of approved liquids water, clear juice, clear tea, black coffee, fruit juices, non-citric and without pulp, carbonated beverages, Gatorade, Kool -Aid, plain Jello-O and plain ice popsicles.  4. Labs: You will need to have blood drawn on Wednesday, November 5 at [facility] (1st Floor), [address], [place], [place] [address].   5. Medication instructions in preparation for your procedure:     Hold Ozempic  7 day before cath   [facility]  Wed 11/5  Hold Farxiga  3 days before cath   [facility] 11/9  Hold Torsemide morning of cath             On the morning of your procedure, take your Aspirin  81 mg and Plavix /Clopidogrel  and any morning medicines NOT listed above.  You may use sips of water.  6. Plan to go home the same day, you will only stay overnight if medically necessary. 7. Bring a current list of your medications and current insurance cards. 8. You MUST have a responsible person to drive you home. 9. Someone MUST be with you the first 24 hours after you arrive home or your discharge will be delayed. 10. Please wear clothes that are easy to get on and off and wear slip-on shoes.  Thank you for allowing us  to care for you!   --  [facility]  We recommend signing up for the patient portal called MyChart.  Sign up information is provided on this After Visit Summary.  MyChart is used to connect with patients for Virtual Visits (Telemedicine).  Patients are able to view lab/test results, encounter notes, upcoming appointments, etc.  Non-urgent messages can be sent to  your provider as well.   To learn more about what you can do with MyChart, go to [email or web address].         Signed, Jehiel Koepp, MD  03/23/2024 10:21 AM    [facility]

## 2024-03-23 ENCOUNTER — Ambulatory Visit (INDEPENDENT_AMBULATORY_CARE_PROVIDER_SITE_OTHER): Admitting: Student

## 2024-03-23 ENCOUNTER — Encounter: Payer: Self-pay | Admitting: Student

## 2024-03-23 ENCOUNTER — Ambulatory Visit: Attending: Cardiology | Admitting: Cardiology

## 2024-03-23 ENCOUNTER — Encounter: Payer: Self-pay | Admitting: Cardiology

## 2024-03-23 ENCOUNTER — Other Ambulatory Visit: Payer: Self-pay | Admitting: Cardiology

## 2024-03-23 ENCOUNTER — Encounter: Admitting: Student

## 2024-03-23 VITALS — BP 131/75 | HR 100 | Ht 63.0 in | Wt 132.7 lb

## 2024-03-23 VITALS — BP 146/85 | HR 84 | Temp 98.3°F | Ht 63.0 in | Wt 132.0 lb

## 2024-03-23 DIAGNOSIS — I25118 Atherosclerotic heart disease of native coronary artery with other forms of angina pectoris: Secondary | ICD-10-CM

## 2024-03-23 DIAGNOSIS — Z9889 Other specified postprocedural states: Secondary | ICD-10-CM

## 2024-03-23 DIAGNOSIS — E7849 Other hyperlipidemia: Secondary | ICD-10-CM | POA: Diagnosis not present

## 2024-03-23 DIAGNOSIS — E119 Type 2 diabetes mellitus without complications: Secondary | ICD-10-CM | POA: Diagnosis not present

## 2024-03-23 DIAGNOSIS — I2510 Atherosclerotic heart disease of native coronary artery without angina pectoris: Secondary | ICD-10-CM | POA: Diagnosis not present

## 2024-03-23 DIAGNOSIS — Z01812 Encounter for preprocedural laboratory examination: Secondary | ICD-10-CM

## 2024-03-23 DIAGNOSIS — I10 Essential (primary) hypertension: Secondary | ICD-10-CM

## 2024-03-23 NOTE — Progress Notes (Signed)
 Patient is a 68 year old female who underwent panniculectomy with Dr. Waddell on 02/03/2024.  Patient is about 7 weeks postop.  She presents to the clinic today for postoperative follow-up.  Patient was last seen in the clinic on 03/09/2024.  At this visit, patient was overall doing well.  She was having issues with her lymphedema the last time she was here at the clinic.  On exam, abdomen was soft and nontender.  There is no overlying erythema.  Incision was overall healing well, there were a few superficial wounds noted.  There is no sign of infection.  It was recommended that she apply Vaseline throughout her incision and her wounds daily and she continue with compression at all times.  Plan is for patient to follow back up in 1 to 2 weeks.  Today, patient reports she is doing well.  She states that since her last appointment, she has some cardiac procedures planned coming up in the next few weeks.  She reports that she saw her PCP in regards to her leg and her PCP recommended continued wound care to the area daily.  Recommended that patient continue to follow-up with her PCP in regards to this wound.  From a panniculectomy standpoint, patient reports she is doing well.  She has no new issues or concerns.  Does not report any fevers or chills.  Chaperone present on exam.  On exam, patient is sitting upright in no acute distress.  Abdomen is soft and nontender.  There is no overlying erythema.  No obvious fluid collections on exam.  There is a small superficial wound noted to the lateral left incision.  Incision is otherwise well-healed.  There are no signs of infection on exam.  Recommended that patient continue with Vaseline to her incision into the wound daily for the next 3 to 4 weeks or until the wound is healed.  Discussed with her that once the wound is healed, she may transition to scar creams.  Patient expressed understanding.  Discussed with the patient that she no longer has to wear compression  and may start increasing her activities.  She expressed understanding.  Patient to follow-up as needed.  I discussed with her that if her wound does not heal in the next few weeks, I recommended that she call us  back to be reevaluated.  She expressed understanding.  Pictures were obtained of the patient and placed in the chart with the patient's or guardian's permission.

## 2024-03-23 NOTE — Patient Instructions (Signed)
 Medication Instructions:  Continue same medications *If you need a refill on your cardiac medications before your next appointment, please call your pharmacy*  Lab Work: Fasting lab cmet,lipid panel,cbc to be done Wed 11/5  Testing/Procedures: Echo  first available    Follow-Up: At [facility], you and your health needs are our priority.  As part of our continuing mission to provide you with exceptional heart care, our providers are all part of one team.  This team includes your primary Cardiologist (physician) and Advanced Practice Providers or APPs (Physician Assistants and Nurse Practitioners) who all work together to provide you with the care you need, when you need it.  Your next appointment:   After Cath    Provider:  Dr.Jordan         [place] A DEPT OF [place]ARTC[facility] AT [address] A DEPT OF THE [place]. [facility] [address] [place] [address] Dept: [phone] Loc: [phone]  Rebekah Kennedy  03/23/2024  You are scheduled for a Cardiac Catheterization on Wednesday, November 12 with Dr. Peter Jordan.  1. Please arrive at the [facility] (Main Entrance A) at [facility]: [address] [place], [place] [address] at 6:30 AM (This time is 2 hour(s) before your procedure to ensure your preparation).   Free valet parking service is available. You will check in at ADMITTING. The support person will be asked to wait in the waiting room.  It is OK to have someone drop you off and come back when you are ready to be discharged.    Special note: Every effort is made to have your procedure done on time. Please understand that emergencies sometimes delay scheduled procedures.  2. Diet: Nothing to eat after midnight.   3. Hydration: You need to be well hydrated before your procedure. On November 12, you may drink approved liquids (see below) until 2 hours before the procedure, with 16 oz of water as your last  intake.   List of approved liquids water, clear juice, clear tea, black coffee, fruit juices, non-citric and without pulp, carbonated beverages, Gatorade, Kool -Aid, plain Jello-O and plain ice popsicles.  4. Labs: You will need to have blood drawn on Wednesday, November 5 at [facility] (1st Floor), [address] [place], [place] [address].   5. Medication instructions in preparation for your procedure:     Hold Ozempic  7 day before cath   [facility]  Wed 11/5  Hold Farxiga  3 days before cath   [facility] 11/9  Hold Torsemide morning of cath             On the morning of your procedure, take your Aspirin  81 mg and Plavix /Clopidogrel  and any morning medicines NOT listed above.  You may use sips of water.  6. Plan to go home the same day, you will only stay overnight if medically necessary. 7. Bring a current list of your medications and current insurance cards. 8. You MUST have a responsible person to drive you home. 9. Someone MUST be with you the first 24 hours after you arrive home or your discharge will be delayed. 10. Please wear clothes that are easy to get on and off and wear slip-on shoes.  Thank you for allowing us  to care for you!   -- [facility]  We recommend signing up for the patient  portal called MyChart.  Sign up information is provided on this After Visit Summary.  MyChart is used to connect with patients for Virtual Visits (Telemedicine).  Patients are able to view lab/test results, encounter notes, upcoming appointments, etc.  Non-urgent messages can be sent to your provider as well.   To learn more about what you can do with MyChart, go to [email or web address].

## 2024-03-25 ENCOUNTER — Other Ambulatory Visit: Payer: Self-pay

## 2024-03-25 DIAGNOSIS — I25118 Atherosclerotic heart disease of native coronary artery with other forms of angina pectoris: Secondary | ICD-10-CM

## 2024-03-25 DIAGNOSIS — Z79899 Other long term (current) drug therapy: Secondary | ICD-10-CM

## 2024-03-25 DIAGNOSIS — E7849 Other hyperlipidemia: Secondary | ICD-10-CM

## 2024-03-25 DIAGNOSIS — I2510 Atherosclerotic heart disease of native coronary artery without angina pectoris: Secondary | ICD-10-CM | POA: Diagnosis not present

## 2024-03-25 DIAGNOSIS — Z01812 Encounter for preprocedural laboratory examination: Secondary | ICD-10-CM | POA: Diagnosis not present

## 2024-03-25 LAB — CBC WITH DIFFERENTIAL/PLATELET

## 2024-03-26 ENCOUNTER — Ambulatory Visit: Payer: Self-pay | Admitting: Cardiology

## 2024-03-26 LAB — CBC WITH DIFFERENTIAL/PLATELET
Basos: 1 %
EOS (ABSOLUTE): 0.1 x10E3/uL (ref 0.0–0.2)
Eos: 2 %
Hematocrit: 41 % (ref 34.0–46.6)
Hemoglobin: 12.8 g/dL (ref 11.1–15.9)
Immature Granulocytes: 0 %
Immature Granulocytes: 0 x10E3/uL (ref 0.0–0.1)
Lymphs: 32 %
MCH: 27.4 pg (ref 26.6–33.0)
MCHC: 31.2 g/dL — AB (ref 31.5–35.7)
MCV: 88 fL (ref 79–97)
Monocytes Absolute: 0.2 x10E3/uL (ref 0.0–0.4)
Monocytes Absolute: 0.6 x10E3/uL (ref 0.1–0.9)
Monocytes: 6 %
Neutrophils Absolute: 3 x10E3/uL (ref 0.7–3.1)
Neutrophils Absolute: 5.4 x10E3/uL (ref 1.4–7.0)
Neutrophils: 59 %
Platelets: 323 x10E3/uL (ref 150–450)
RBC: 4.67 x10E6/uL (ref 3.77–5.28)
RDW: 12.9 % (ref 11.7–15.4)
WBC: 9.2 x10E3/uL (ref 3.4–10.8)

## 2024-03-26 LAB — COMPREHENSIVE METABOLIC PANEL WITH GFR
ALT: 26 IU/L (ref 0–32)
AST: 18 IU/L (ref 0–40)
Albumin: 4.3 g/dL (ref 3.9–4.9)
Alkaline Phosphatase: 92 IU/L (ref 49–135)
BUN/Creatinine Ratio: 25 (ref 12–28)
BUN: 21 mg/dL (ref 8–27)
Bilirubin Total: 0.8 mg/dL (ref 0.0–1.2)
CO2: 27 mmol/L (ref 20–29)
Calcium: 9.7 mg/dL (ref 8.7–10.3)
Chloride: 101 mmol/L (ref 96–106)
Creatinine, Ser: 0.83 mg/dL (ref 0.57–1.00)
Globulin, Total: 2.7 g/dL (ref 1.5–4.5)
Glucose: 152 mg/dL — ABNORMAL HIGH (ref 70–99)
Potassium: 3.9 mmol/L (ref 3.5–5.2)
Sodium: 141 mmol/L (ref 134–144)
Total Protein: 7 g/dL (ref 6.0–8.5)
eGFR: 77 mL/min/1.73 (ref >59)

## 2024-03-26 LAB — LIPID PANEL
Chol/HDL Ratio: 1.8 ratio (ref 0.0–4.4)
Cholesterol, Total: 195 mg/dL (ref 100–199)
HDL: 110 mg/dL (ref >39)
LDL Chol Calc (NIH): 69 mg/dL (ref 0–99)
Triglycerides: 93 mg/dL (ref 0–149)
VLDL Cholesterol Cal: 16 mg/dL (ref 5–40)

## 2024-03-29 ENCOUNTER — Encounter: Payer: Self-pay | Admitting: Internal Medicine

## 2024-03-29 NOTE — Progress Notes (Unsigned)
 Subjective:    Patient ID: Rebekah Kennedy, female    DOB: 1955/06/23, 68 y.o.   MRN: [id number]     HPI Rebekah Kennedy is here for follow up of her chronic medical problems.  S/p panniculectomy 02/03/24 Scheduled for L heart cath 04/01/24  Blood work 11/5 - normal K, GFR  Medications and allergies reviewed with patient and updated if appropriate.  Current Outpatient Medications on File Prior to Visit  Medication Sig Dispense Refill   acetaminophen  (TYLENOL ) 500 MG tablet Take 1 tablet (500 mg total) by mouth every 6 (six) hours as needed. (Patient not taking: Reported on 03/26/2024) 30 tablet 0   albuterol  (PROVENTIL  HFA;VENTOLIN  HFA) 108 (90 Base) MCG/ACT inhaler Inhale 2 puffs into the lungs every 4 (four) hours as needed for wheezing or shortness of breath. 18 g 2   aspirin  EC 81 MG tablet Take 1 tablet (81 mg total) by mouth daily. 90 tablet 1   Biotin  5000 MCG TABS Take 10,000 mcg by mouth daily.      Cholecalciferol (VITAMIN D3 PO) Take 2,000 Units by mouth daily.     clopidogrel  (PLAVIX ) 75 MG tablet TAKE 1 TABLET BY MOUTH EVERY DAY 90 tablet 1   Continuous Glucose Receiver (FREESTYLE LIBRE 3 READER) DEVI Use to monitor glucose continuously. 1 each 0   Continuous Glucose Sensor (FREESTYLE LIBRE 3 PLUS SENSOR) MISC 1 each by Does not apply route every 14 (fourteen) days. 2 each 11   dapagliflozin  propanediol (FARXIGA ) 10 MG TABS tablet Take 1 tablet (10 mg total) by mouth daily before breakfast. 90 tablet 3   hydrALAZINE  (APRESOLINE ) 50 MG tablet Take 1 tablet (50 mg total) by mouth 3 (three) times daily. 90 tablet 0   isosorbide mononitrate (IMDUR) 30 MG 24 hr tablet Take 1 tablet (30 mg total) by mouth daily. 30 tablet 0   metoprolol  succinate (TOPROL -XL) 100 MG 24 hr tablet TAKE 1 TABLET BY MOUTH DAILY WITH OR IMMEDIATELY FOLLOWING A MEAL 90 tablet 1   montelukast  (SINGULAIR ) 10 MG tablet TAKE 1 TABLET BY MOUTH EVERY DAY 90 tablet 1   nystatin -triamcinolone  ointment (MYCOLOG)  Apply topically as needed. 30 g 4   ondansetron  (ZOFRAN -ODT) 4 MG disintegrating tablet Take 1 tablet (4 mg total) by mouth every 8 (eight) hours as needed for nausea or vomiting. (Patient not taking: Reported on 03/26/2024) 20 tablet 0   OVER THE COUNTER MEDICATION Take 1 tablet by mouth daily. Multivitamin centrum 1 daily (Patient not taking: Reported on 03/26/2024)     predniSONE (DELTASONE) 20 MG tablet Take 20 mg by mouth daily with breakfast.     rosuvastatin  (CRESTOR ) 20 MG tablet Take 1 tablet (20 mg total) by mouth daily. 90 tablet 3   Semaglutide , 1 MG/DOSE, (OZEMPIC , 1 MG/DOSE,) 4 MG/3ML SOPN Inject 1 mg into the skin once a week. (Patient not taking: Reported on 03/26/2024) 9 mL 3   spironolactone  (ALDACTONE ) 25 MG tablet Take 1 tablet (25 mg total) by mouth daily. 90 tablet 3   tacrolimus  (PROTOPIC ) 0.1 % ointment Apply topically 2 (two) times daily. (Patient taking differently: Apply 1 Application topically daily as needed (blisters).) 30 g 2   torsemide (DEMADEX) 20 MG tablet Take 1 tablet (20 mg total) by mouth daily. (Patient taking differently: Take 10-20 mg by mouth daily.) 90 tablet 0   valsartan  (DIOVAN ) 320 MG tablet Take 0.5 tablets (160 mg total) by mouth daily.     No current facility-administered medications on file  prior to visit.     Review of Systems     Objective:  There were no vitals filed for this visit. BP Readings from Last 3 Encounters:  03/23/24 (!) 146/85  03/23/24 131/75  03/16/24 (!) 152/82   Wt Readings from Last 3 Encounters:  03/23/24 132 lb (59.9 kg)  03/23/24 132 lb 11.2 oz (60.2 kg)  03/16/24 141 lb (64 kg)   There is no height or weight on file to calculate BMI.    Physical Exam     Lab Results  Component Value Date   WBC 9.2 03/25/2024   HGB 12.8 03/25/2024   HCT 41.0 03/25/2024   PLT 323 03/25/2024   GLUCOSE 152 (H) 03/25/2024   CHOL 195 03/25/2024   TRIG 93 03/25/2024   HDL 110 03/25/2024   LDLDIRECT 126.7 03/24/2013    LDLCALC 69 03/25/2024   ALT 26 03/25/2024   AST 18 03/25/2024   NA 141 03/25/2024   K 3.9 03/25/2024   CL 101 03/25/2024   CREATININE 0.83 03/25/2024   BUN 21 03/25/2024   CO2 27 03/25/2024   TSH 1.32 03/02/2024   INR 0.96 07/07/2018   HGBA1C 6.0 03/02/2024   MICROALBUR 75.6 12/09/2023     Assessment & Plan:    See Problem List for Assessment and Plan of chronic medical problems.

## 2024-03-29 NOTE — Patient Instructions (Addendum)
      Blood work was ordered.       Medications changes include :   None    A referral was ordered and someone will call you to schedule an appointment.     Return for follow up as scheduled.  06/01/2024

## 2024-03-30 ENCOUNTER — Ambulatory Visit: Admitting: Internal Medicine

## 2024-03-30 ENCOUNTER — Telehealth: Payer: Self-pay | Admitting: Internal Medicine

## 2024-03-30 VITALS — BP 146/78 | HR 80 | Temp 98.6°F | Ht 63.0 in | Wt 132.0 lb

## 2024-03-30 DIAGNOSIS — R600 Localized edema: Secondary | ICD-10-CM | POA: Diagnosis not present

## 2024-03-30 DIAGNOSIS — E785 Hyperlipidemia, unspecified: Secondary | ICD-10-CM | POA: Diagnosis not present

## 2024-03-30 DIAGNOSIS — I152 Hypertension secondary to endocrine disorders: Secondary | ICD-10-CM | POA: Diagnosis not present

## 2024-03-30 DIAGNOSIS — E1159 Type 2 diabetes mellitus with other circulatory complications: Secondary | ICD-10-CM

## 2024-03-30 DIAGNOSIS — Z7985 Long-term (current) use of injectable non-insulin antidiabetic drugs: Secondary | ICD-10-CM | POA: Diagnosis not present

## 2024-03-30 DIAGNOSIS — E1169 Type 2 diabetes mellitus with other specified complication: Secondary | ICD-10-CM

## 2024-03-30 DIAGNOSIS — E1151 Type 2 diabetes mellitus with diabetic peripheral angiopathy without gangrene: Secondary | ICD-10-CM

## 2024-03-30 DIAGNOSIS — Z7984 Long term (current) use of oral hypoglycemic drugs: Secondary | ICD-10-CM

## 2024-03-30 DIAGNOSIS — I10 Essential (primary) hypertension: Secondary | ICD-10-CM | POA: Diagnosis not present

## 2024-03-30 MED ORDER — TORSEMIDE 20 MG PO TABS: 10.0000 mg | ORAL_TABLET | Freq: Every day | ORAL | Status: DC | PRN

## 2024-03-30 NOTE — Assessment & Plan Note (Addendum)
 Chronic Associated with PVD, CAD, hypertension, hyperlipidemia Management per Dr  Trixie Lab Results  Component Value Date   HGBA1C 6.0 03/02/2024   Sugars controlled Stressed compliance with diabetic diet Stressed regular exercise Continue Farxiga  10 mg daily, Ozempic  1 mg

## 2024-03-30 NOTE — Assessment & Plan Note (Signed)
 Chronic Has some trace edema Has difficulty taking torsemide because of the increase in urination-often takes 10 mg in the morning and sometimes 10 mg in the evening or at night before going to bed Stressed low-sodium diet, elevating legs when sitting Advised to try taking torsemide a few times a week-may not need on a daily basis

## 2024-03-30 NOTE — Telephone Encounter (Signed)
 Can you please call her regarding her blood pressure.  Blood pressure is still slightly elevated when we rechecked it.  Have her try taking 1 valsartan  twice daily to see if that lowers the blood pressure enough, but not too much..  Have her call us  back with an update on her blood pressure readings

## 2024-03-30 NOTE — Assessment & Plan Note (Addendum)
 Chronic Lab Results  Component Value Date   LDLCALC 69 03/25/2024    Continue Crestor  20 mg daily

## 2024-03-30 NOTE — Assessment & Plan Note (Addendum)
 Chronic Not ideally controlled Currently taking valsartan  160 mg daily, torsemide 20 mg a few days a week, spironolactone  25 mg daily, metoprolol  XL 100 mg daily, Imdur 30 mg daily and hydralazine  50 mg 3 times a day  Blood pressure better controlled at home-cross was accurate what we got here Continue monitoring blood pressure at home I will have her try taking 2 valsartan  a day to see if that lowers the blood pressure little bit but not too much.

## 2024-03-30 NOTE — Telephone Encounter (Signed)
 Called and left message for patient today to return call to clinic and also left detailed message with recommendations.  Mychart sent also

## 2024-03-31 ENCOUNTER — Telehealth: Payer: Self-pay | Admitting: *Deleted

## 2024-03-31 NOTE — Telephone Encounter (Signed)
 Cardiac Catheterization scheduled at [facility] for: Wednesday April 01, 2024 8:30 AM Arrival time [facility] Main Entrance A at: 6:30 AM  Diet: -Nothing to eat after midnight.  Hydration: -May drink clear liquids until 2 hours before the procedure.  Approved liquids: Water, clear tea, black coffee, fruit juices-non-citric and without pulp,Gatorade, plain Jello/popsicles.   -Please drink 16 oz of water 2 hours before procedure.  Medication instructions: -Hold:  Farxiga -AM of procedure  Spironolactone /Torsemide-AM of procedure -Other usual morning medications can be taken including aspirin  81 mg and Plavix  75 mg.   Ozempic -weekly on Tuesdays-she has not taken this week  Plan to go home the same day, you will only stay overnight if medically necessary.  You must have responsible adult to drive you home.  Someone must be with you the first 24 hours after you arrive home.  Reviewed procedure instructions with patient.

## 2024-04-01 ENCOUNTER — Ambulatory Visit (HOSPITAL_COMMUNITY)
Admission: RE | Admit: 2024-04-01 | Discharge: 2024-04-01 | Disposition: A | Discharge status: Home / Self Care | Attending: Cardiology | Admitting: Cardiology

## 2024-04-01 ENCOUNTER — Encounter (HOSPITAL_COMMUNITY): Payer: Self-pay | Admitting: Cardiology

## 2024-04-01 ENCOUNTER — Other Ambulatory Visit: Payer: Self-pay

## 2024-04-01 ENCOUNTER — Encounter (HOSPITAL_COMMUNITY): Admission: RE | Disposition: A | Payer: Self-pay | Source: Home / Self Care | Attending: Cardiology

## 2024-04-01 DIAGNOSIS — I2582 Chronic total occlusion of coronary artery: Secondary | ICD-10-CM | POA: Diagnosis not present

## 2024-04-01 DIAGNOSIS — I10 Essential (primary) hypertension: Secondary | ICD-10-CM | POA: Diagnosis not present

## 2024-04-01 DIAGNOSIS — Z8673 Personal history of transient ischemic attack (TIA), and cerebral infarction without residual deficits: Secondary | ICD-10-CM | POA: Diagnosis not present

## 2024-04-01 DIAGNOSIS — Z7985 Long-term (current) use of injectable non-insulin antidiabetic drugs: Secondary | ICD-10-CM | POA: Diagnosis not present

## 2024-04-01 DIAGNOSIS — Z79899 Other long term (current) drug therapy: Secondary | ICD-10-CM | POA: Insufficient documentation

## 2024-04-01 DIAGNOSIS — E669 Obesity, unspecified: Secondary | ICD-10-CM | POA: Insufficient documentation

## 2024-04-01 DIAGNOSIS — Z6828 Body mass index (BMI) 28.0-28.9, adult: Secondary | ICD-10-CM | POA: Diagnosis not present

## 2024-04-01 DIAGNOSIS — E7849 Other hyperlipidemia: Secondary | ICD-10-CM | POA: Insufficient documentation

## 2024-04-01 DIAGNOSIS — I25118 Atherosclerotic heart disease of native coronary artery with other forms of angina pectoris: Secondary | ICD-10-CM | POA: Diagnosis not present

## 2024-04-01 DIAGNOSIS — Z7982 Long term (current) use of aspirin: Secondary | ICD-10-CM | POA: Diagnosis not present

## 2024-04-01 DIAGNOSIS — E1169 Type 2 diabetes mellitus with other specified complication: Secondary | ICD-10-CM

## 2024-04-01 DIAGNOSIS — Z7984 Long term (current) use of oral hypoglycemic drugs: Secondary | ICD-10-CM | POA: Diagnosis not present

## 2024-04-01 DIAGNOSIS — E1140 Type 2 diabetes mellitus with diabetic neuropathy, unspecified: Secondary | ICD-10-CM | POA: Diagnosis not present

## 2024-04-01 DIAGNOSIS — R600 Localized edema: Secondary | ICD-10-CM | POA: Diagnosis not present

## 2024-04-01 HISTORY — PX: LEFT HEART CATH AND CORONARY ANGIOGRAPHY: CATH118249

## 2024-04-01 LAB — GLUCOSE, CAPILLARY
Glucose-Capillary: 132 mg/dL — ABNORMAL HIGH (ref 70–99)
Glucose-Capillary: 96 mg/dL (ref 70–99)

## 2024-04-01 SURGERY — LEFT HEART CATH AND CORONARY ANGIOGRAPHY
Anesthesia: LOCAL

## 2024-04-01 MED ORDER — ACETAMINOPHEN 325 MG PO TABS: 650.0000 mg | ORAL_TABLET | ORAL | Status: DC | PRN

## 2024-04-01 MED ORDER — FENTANYL CITRATE (PF) 100 MCG/2ML IJ SOLN
INTRAMUSCULAR | Status: AC
  Filled 2024-04-01: qty 2

## 2024-04-01 MED ORDER — ASPIRIN 81 MG PO CHEW: 81.0000 mg | CHEWABLE_TABLET | ORAL | Status: DC

## 2024-04-01 MED ORDER — FREE WATER: 500.0000 mL | Freq: Once | Status: DC

## 2024-04-01 MED ORDER — ISOSORBIDE MONONITRATE ER 30 MG PO TB24: 30.0000 mg | ORAL_TABLET | Freq: Every day | ORAL | 3 refills | Status: AC

## 2024-04-01 MED ORDER — SODIUM CHLORIDE 0.9% FLUSH: 3.0000 mL | INTRAVENOUS | Status: DC | PRN

## 2024-04-01 MED ORDER — VERAPAMIL HCL 2.5 MG/ML IV SOLN
INTRAVENOUS | Status: AC
  Filled 2024-04-01: qty 2

## 2024-04-01 MED ORDER — HEPARIN SODIUM (PORCINE) 1000 UNIT/ML IJ SOLN
INTRAMUSCULAR | Status: DC | PRN
  Administered 2024-04-01: 3000 [IU] via INTRAVENOUS

## 2024-04-01 MED ORDER — LIDOCAINE HCL (PF) 1 % IJ SOLN
INTRAMUSCULAR | Status: DC | PRN
  Administered 2024-04-01: 2 mL

## 2024-04-01 MED ORDER — HEPARIN SODIUM (PORCINE) 1000 UNIT/ML IJ SOLN
INTRAMUSCULAR | Status: AC
  Filled 2024-04-01: qty 10

## 2024-04-01 MED ORDER — SODIUM CHLORIDE 0.9% FLUSH: 3.0000 mL | Freq: Two times a day (BID) | INTRAVENOUS | Status: DC

## 2024-04-01 MED ORDER — SODIUM CHLORIDE 0.9 % IV SOLN: 250.0000 mL | INTRAVENOUS | Status: DC | PRN

## 2024-04-01 MED ORDER — IOHEXOL 350 MG/ML SOLN
INTRAVENOUS | Status: DC | PRN
  Administered 2024-04-01: 50 mL

## 2024-04-01 MED ORDER — HYDRALAZINE HCL 20 MG/ML IJ SOLN
10.0000 mg | INTRAMUSCULAR | Status: DC | PRN
Start: 2024-04-01 — End: 2024-04-01

## 2024-04-01 MED ORDER — ONDANSETRON HCL 4 MG/2ML IJ SOLN: 4.0000 mg | Freq: Four times a day (QID) | INTRAMUSCULAR | Status: DC | PRN

## 2024-04-01 MED ORDER — MIDAZOLAM HCL (PF) 2 MG/2ML IJ SOLN
INTRAMUSCULAR | Status: DC | PRN
  Administered 2024-04-01: 1 mg via INTRAVENOUS

## 2024-04-01 MED ORDER — ROSUVASTATIN CALCIUM 40 MG PO TABS: 40.0000 mg | ORAL_TABLET | Freq: Every day | ORAL | 3 refills | Status: AC

## 2024-04-01 MED ORDER — MIDAZOLAM HCL 2 MG/2ML IJ SOLN
INTRAMUSCULAR | Status: AC
  Filled 2024-04-01: qty 2

## 2024-04-01 MED ORDER — HEPARIN (PORCINE) IN NACL 1000-0.9 UT/500ML-% IV SOLN
INTRAVENOUS | Status: DC | PRN
  Administered 2024-04-01 (×2): 500 mL

## 2024-04-01 MED ORDER — HEPARIN (PORCINE) IN NACL 2-0.9 UNITS/ML
INTRAMUSCULAR | Status: DC | PRN
  Administered 2024-04-01: 10 mL via INTRA_ARTERIAL

## 2024-04-01 MED ORDER — FENTANYL CITRATE (PF) 100 MCG/2ML IJ SOLN
INTRAMUSCULAR | Status: DC | PRN
  Administered 2024-04-01: 25 ug via INTRAVENOUS

## 2024-04-01 MED ORDER — LIDOCAINE HCL (PF) 1 % IJ SOLN
INTRAMUSCULAR | Status: AC
  Filled 2024-04-01: qty 30

## 2024-04-01 SURGICAL SUPPLY — 8 items
CATH 5FR JL3.5 JR4 ANG PIG MP (CATHETERS) IMPLANT
DEVICE RAD TR BAND REGULAR (VASCULAR PRODUCTS) IMPLANT
GLIDESHEATH SLEND SS 6F .021 (SHEATH) IMPLANT
GUIDEWIRE INQWIRE 1.5J.035X260 (WIRE) IMPLANT
KIT NAMIC PS PRESSURIZED FLUID (KITS) IMPLANT
PACK CARDIAC CATHETERIZATION (CUSTOM PROCEDURE TRAY) ×1 IMPLANT
SET ATX-X65L (MISCELLANEOUS) IMPLANT
SHEATH PROBE COVER 6X72 (BAG) IMPLANT

## 2024-04-01 NOTE — Discharge Instructions (Signed)

## 2024-04-01 NOTE — Interval H&P Note (Signed)
 History and Physical Interval Note:  04/01/2024 6:49 AM  Rebekah Kennedy  has presented today for surgery, with the diagnosis of cad.  The various methods of treatment have been discussed with the patient and family. After consideration of risks, benefits and other options for treatment, the patient has consented to  Procedure(s): LEFT HEART CATH AND CORONARY ANGIOGRAPHY (N/A) as a surgical intervention.  The patient's history has been reviewed, patient examined, no change in status, stable for surgery.  I have reviewed the patient's chart and labs.  Questions were answered to the patient's satisfaction.   Cath Lab Visit (complete for each Cath Lab visit)  Clinical Evaluation Leading to the Procedure:   ACS: No.  Non-ACS:    Anginal Classification: CCS I  Anti-ischemic medical therapy: Maximal Therapy (2 or more classes of medications)  Non-Invasive Test Results: High-risk stress test findings: cardiac mortality >3%/year  Prior CABG: No previous CABG        [facility] 04/01/2024 6:50 AM

## 2024-04-02 NOTE — Progress Notes (Incomplete)
 68 y.o. G0P0 postmenopausal female here for annual exam. Single. PCP: Geofm Glade PARAS, MD   She reports ***. Urine sample provided: ***  Postmenopausal bleeding: *** Pelvic discharge or pain: *** Breast mass, nipple discharge or skin changes : *** Sexually active: ***   Last PAP:     Component Value Date/Time   DIAGPAP  10/27/2021 1149    - Negative for intraepithelial lesion or malignancy (NILM)   ADEQPAP  10/27/2021 1149    Satisfactory for evaluation; transformation zone component PRESENT.   Last mammogram: 11/05/23 birads 2, density B Last DXA: 10/18/20 *** Last colonoscopy: 01/23/19 q7y  Exercising: *** Smoker:***  Flowsheet Row Office Visit from 03/30/2024 in [facility] [place]  PHQ-2 Total Score 0    Flowsheet Row Office Visit from 03/30/2024 in [facility]  PHQ-9 Total Score 0     GYN HISTORY: ***  OB History  Gravida Para Term Preterm AB Living  0       SAB IAB Ectopic Multiple Live Births         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 issuse resolved   Retinal edema    gets steriod inj in the eyes     Sleep apnea    not needed since 100 pound weight loss 2019-2020   Splenomegaly    in college   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   left side pt states, left wide  weak per pt   Stroke [facility]) 2012   right pontine cva with left hemiparesis   Past Surgical History:  Procedure Laterality Date   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   APPENDECTOMY     yrs ago per pt 0n 11-29-2020   BIOPSY THYROID   05/02/2011   Nonneoplastic goiter   BREAST BIOPSY     BREAST LUMPECTOMY WITH RADIOACTIVE SEED LOCALIZATION Left 07/25/2017   Procedure: LEFT BREAST LUMPECTOMY WITH RADIOACTIVE SEED LOCALIZATION;  Surgeon: Vernetta Berg, MD;  Location: [facility] OR;  Service: General;  Laterality: Left;   BREAST SURGERY     Reduction   cataracts Bilateral 2012   COLONOSCOPY  2021   COLPOSCOPY     DILATATION & CURETTAGE/HYSTEROSCOPY WITH MYOSURE N/A 12/02/2020   Procedure: DILATATION & CURETTAGE/HYSTEROSCOPY WITH MYOSURE;  Surgeon: Lavoie, Marie-Lyne, MD;  Location: [facility];  Service: Gynecology;  Laterality: N/A;   DILATION AND CURETTAGE OF UTERUS  1975   DUB   EYE SURGERY     Laser for vision at times both eyes done   GASTRIC ROUX-EN-Y N/A 08/12/2017   Procedure: LAPAROSCOPIC ROUX-EN-Y GASTRIC BYPASS WITH HIATAL HERNIA REPAIR AND UPPER ENDOSCOPY;  Surgeon: Stevie Herlene Righter,  MD;  Location: [facility] ORS;  Service: General;  Laterality: N/A;   GYNECOLOGIC CRYOSURGERY     LEFT HEART CATH AND CORONARY ANGIOGRAPHY N/A 04/01/2024   Procedure: LEFT HEART CATH AND CORONARY ANGIOGRAPHY;  Surgeon: Jordan, Peter M, MD;  Location: [facility] INVASIVE CV LAB;  Service: Cardiovascular;  Laterality: N/A;   MYOMECTOMY     OVARIAN CYST REMOVAL     PANNICULECTOMY N/A 02/03/2024   Procedure: PANNICULECTOMY;  Surgeon: Waddell Leonce NOVAK, MD;  Location: [facility];  Service: Plastics;  Laterality: N/A;   PELVIC LAPAROSCOPY     DL lysis of adhesions 8024 and 1988   Current Outpatient Medications on File Prior to Visit  Medication Sig Dispense Refill   albuterol  (PROVENTIL  HFA;VENTOLIN  HFA) 108 (90 Base) MCG/ACT inhaler Inhale 2 puffs into the lungs every 4 (four) hours as  needed for wheezing or shortness of breath. 18 g 2   aspirin  EC 81 MG tablet Take 1 tablet (81 mg total) by mouth daily. 90 tablet 1   Biotin  5000 MCG TABS Take 10,000 mcg by mouth daily.      Cholecalciferol (VITAMIN D3 PO) Take 2,000 Units by mouth daily.     clopidogrel  (PLAVIX ) 75 MG tablet TAKE 1 TABLET BY MOUTH EVERY DAY 90 tablet 1   Continuous Glucose Receiver (FREESTYLE LIBRE 3 READER) DEVI Use to monitor glucose continuously. 1 each 0   Continuous Glucose Sensor (FREESTYLE LIBRE 3 PLUS SENSOR) MISC 1 each by Does not apply route every 14 (fourteen) days. 2 each 11   dapagliflozin  propanediol (FARXIGA ) 10 MG TABS tablet Take 1 tablet (10 mg total) by mouth daily before breakfast. 90 tablet 3   hydrALAZINE  (APRESOLINE ) 50 MG tablet Take 1 tablet (50 mg total) by mouth 3 (three) times daily. 90 tablet 0   isosorbide mononitrate (IMDUR) 30 MG 24 hr tablet Take 1 tablet (30 mg total) by mouth daily. 90 tablet 3   metoprolol  succinate (TOPROL -XL) 100 MG 24 hr tablet TAKE 1 TABLET BY MOUTH DAILY WITH OR IMMEDIATELY FOLLOWING A MEAL 90 tablet 1   montelukast  (SINGULAIR ) 10 MG tablet TAKE 1 TABLET BY MOUTH EVERY DAY 90 tablet 1   nystatin -triamcinolone  ointment (MYCOLOG) Apply topically as needed. 30 g 4   rosuvastatin  (CRESTOR ) 40 MG tablet Take 1 tablet (40 mg total) by mouth daily. 90 tablet 3   Semaglutide , 1 MG/DOSE, (OZEMPIC , 1 MG/DOSE,) 4 MG/3ML SOPN Inject 1 mg into the skin once a week. 9 mL 3   spironolactone  (ALDACTONE ) 25 MG tablet Take 1 tablet (25 mg total) by mouth daily. 90 tablet 3   tacrolimus  (PROTOPIC ) 0.1 % ointment Apply topically 2 (two) times daily. (Patient taking differently: Apply 1 Application topically daily as needed (blisters).) 30 g 2   torsemide (DEMADEX) 20 MG tablet Take 0.5-1 tablets (10-20 mg total) by mouth daily as needed.     valsartan  (DIOVAN ) 320 MG tablet Take 0.5 tablets (160 mg total) by mouth daily.     vitamin B-12 (CYANOCOBALAMIN ) 100 MCG tablet  Take 100 mcg by mouth daily.     No current facility-administered medications on file prior to visit.   Social History   Socioeconomic History   Marital status: Single    Spouse name: Not on file   Number of children: 2   Years of education: 16   Highest education level: Master's degree (e.g., MA, MS, MEng, MEd, MSW, MBA)  Occupational History   Occupation: TEACHER    Employer: [employer]	[place]  Tobacco Use   Smoking status: Never   Smokeless tobacco: Never  Vaping Use   Vaping status: Never Used  Substance and Sexual Activity   Alcohol  use: Yes    Alcohol /week: 1.0 standard drink of alcohol     Types: 1 Glasses of wine per week    Comment: occas   Drug use: Never   Sexual activity: Not Currently    Birth control/protection: Post-menopausal    Comment: older than 17, less than 5  Other Topics Concern   Not on file  Social History Narrative   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   Living with daughter      Right handed      4 steps inside of the home      Social Drivers of Health   Financial Resource Strain: Low Risk  (03/16/2024)   Overall Financial Resource Strain (CARDIA)    Difficulty of Paying Living Expenses: Not hard at all  Food Insecurity: No Food Insecurity (03/16/2024)   Hunger Vital Sign    Worried About Running Out of Food in the Last Year: Never true    Ran Out of Food in the Last Year: Never true  Transportation Needs: No Transportation Needs (03/16/2024)   PRAPARE - Administrator, Civil Service (Medical): No    Lack of Transportation (Non-Medical): No  Physical Activity: Insufficiently Active (03/16/2024)   Exercise Vital Sign    Days of Exercise per Week: 2 days    Minutes of Exercise per Session: 40 min  Stress: No Stress Concern Present (03/16/2024)   [employer] of Occupational Health - Occupational Stress Questionnaire    Feeling of Stress: Not at all  Social  Connections: Moderately Integrated (03/16/2024)   Social Connection and Isolation Panel    Frequency of Communication with Friends and Family: More than three times a week    Frequency of Social Gatherings with Friends and Family: More than three times a week    Attends Religious Services: More than 4 times per year    Active Member of [employer] or Organizations: Yes    Attends Engineer, Structural: More than 4 times per year    Marital Status: Never married  Recent Concern: Social Connections - Moderately Isolated (01/24/2024)   Social Connection and Isolation Panel    Frequency of Communication with Friends and Family: More than three times a week    Frequency of Social Gatherings with Friends and Family: Three times a week    Attends Religious Services: More than 4 times per year    Active Member of Clubs or Organizations: No    Attends Banker Meetings: Not on file    Marital Status: Never married  Intimate Partner Violence: Not At Risk (01/22/2024)   Humiliation, Afraid, Rape, and Kick questionnaire    Fear of Current or Ex-Partner: No    Emotionally Abused: No    Physically Abused: No    Sexually Abused: No   Family History  Problem Relation Age of Onset   Hypertension Mother    Heart disease Mother    Ovarian cancer Mother    Cancer Mother        Lung cancer   Asthma Mother    COPD Mother    Hyperlipidemia Mother    Diabetes Father    Heart disease Father    Hypertension Father    Hyperlipidemia Father    Stroke  Father        passed at age 67 from stroke   Graves' disease Sister    Diabetes Sister    Breast cancer Sister 25   Stroke Brother    Heart failure Brother    Diabetes Brother    Hypertension Brother    Heart failure Brother    Prostate cancer Brother    Diabetes Brother    Asthma Brother    Hypertension Brother    Kidney disease Brother    Heart failure Brother    Prostate cancer Brother    Stroke Brother    Hypertension Brother     Cancer Maternal Grandmother        Colon Cancer   Asthma Maternal Grandmother    Colon cancer Maternal Grandmother    Cancer Maternal Apolinar Gavel' disease Paternal Grandmother    Hypertension Paternal Grandmother    Heart disease Paternal Grandmother    Alzheimer's disease Paternal Grandmother    Allergies  Allergen Reactions   Quinapril Cough   Amlodipine  Other (See Comments)    edema   Metformin And Related Diarrhea    Loose stool      PE There were no vitals filed for this visit. There is no height or weight on file to calculate BMI.  Physical Exam    Assessment and Plan:        There are no diagnoses linked to this encounter. Clotilda FORBES Pa, CMA

## 2024-04-03 ENCOUNTER — Ambulatory Visit: Payer: Self-pay

## 2024-04-03 NOTE — Heart Team MDD (Signed)
   Heart Team Multi-Disciplinary Discussion  Patient: Rebekah Kennedy  DOB: [birth year]  MRN: [id number]   Date: 04/03/2024  07:00 AM    Attendees: Interventional Cardiology: Lurena Red, MD Lonni Hanson, MD Alm Clay, MD Cara Lovelace, MD Gordy Bergamo, MD Newman Lawrence, MD Ozell Fell, MD Peter Jordan, MD Debby Como, MD  Cardiothoracic Surgery: Blenheim Rayas, MD   Additional Attendees: Con Clunes, MD Madonna Large, DO   Patient History: 68 y.o. female with a history of DM, HTN, CVA, and hyperlipidemia that is currently asymptomatic. She had a mildly elevated troponin and lower extremity edema that resulted in her getting a cardiac CTA which revealed multivessel CAD.   Risk Factors: Diabetes Mellitus Hypertension Hyperlipidemia History of Stroke or TIA     Review of Prior Angiography and PCI Procedures: Cardiac CTA 03/12/24, Echo 07/03/2018, along with a detailed review of coroanry angiography from 04/01/24 with preserved LV function.   Discussion: The team discussed that the patient is on optimal medical therapy without any significant symptoms. PCI options are not favorable due to complex anatomy. Surgical revascularization may be considered only if symptoms develop or if her LV function declines.   Recommendations: Medical Therapy     Shona Palma, RN  04/03/2024 9:03 AM

## 2024-04-06 ENCOUNTER — Encounter: Admitting: Obstetrics and Gynecology

## 2024-04-13 ENCOUNTER — Encounter: Admitting: Obstetrics and Gynecology

## 2024-04-13 ENCOUNTER — Encounter: Payer: Self-pay | Admitting: Obstetrics and Gynecology

## 2024-04-13 ENCOUNTER — Other Ambulatory Visit (HOSPITAL_COMMUNITY)
Admission: RE | Admit: 2024-04-13 | Discharge: 2024-04-13 | Disposition: A | Source: Ambulatory Visit | Attending: Obstetrics and Gynecology | Admitting: Obstetrics and Gynecology

## 2024-04-13 ENCOUNTER — Telehealth: Payer: Self-pay

## 2024-04-13 ENCOUNTER — Ambulatory Visit: Admitting: Obstetrics and Gynecology

## 2024-04-13 VITALS — BP 158/64 | HR 96 | Temp 98.5°F | Ht 64.25 in | Wt 137.0 lb

## 2024-04-13 DIAGNOSIS — N760 Acute vaginitis: Secondary | ICD-10-CM

## 2024-04-13 DIAGNOSIS — B9689 Other specified bacterial agents as the cause of diseases classified elsewhere: Secondary | ICD-10-CM

## 2024-04-13 DIAGNOSIS — Z1331 Encounter for screening for depression: Secondary | ICD-10-CM

## 2024-04-13 DIAGNOSIS — Z01419 Encounter for gynecological examination (general) (routine) without abnormal findings: Secondary | ICD-10-CM | POA: Insufficient documentation

## 2024-04-13 DIAGNOSIS — Z124 Encounter for screening for malignant neoplasm of cervix: Secondary | ICD-10-CM

## 2024-04-13 DIAGNOSIS — N3281 Overactive bladder: Secondary | ICD-10-CM

## 2024-04-13 DIAGNOSIS — Z1151 Encounter for screening for human papillomavirus (HPV): Secondary | ICD-10-CM | POA: Diagnosis not present

## 2024-04-13 DIAGNOSIS — M8580 Other specified disorders of bone density and structure, unspecified site: Secondary | ICD-10-CM | POA: Diagnosis not present

## 2024-04-13 DIAGNOSIS — B372 Candidiasis of skin and nail: Secondary | ICD-10-CM

## 2024-04-13 DIAGNOSIS — Z780 Asymptomatic menopausal state: Secondary | ICD-10-CM | POA: Insufficient documentation

## 2024-04-13 DIAGNOSIS — N898 Other specified noninflammatory disorders of vagina: Secondary | ICD-10-CM

## 2024-04-13 LAB — WET PREP FOR TRICH, YEAST, CLUE

## 2024-04-13 MED ORDER — FLUCONAZOLE 150 MG PO TABS: 150.0000 mg | ORAL_TABLET | ORAL | 0 refills | Status: AC

## 2024-04-13 MED ORDER — METRONIDAZOLE 500 MG PO TABS: 500.0000 mg | ORAL_TABLET | Freq: Two times a day (BID) | ORAL | 0 refills | Status: AC

## 2024-04-13 NOTE — Telephone Encounter (Signed)
 Patient is aware the order for her bone density is placed for [facility]. I gave her the number to call to schedule.

## 2024-04-13 NOTE — Progress Notes (Signed)
 68 y.o. G0P0 postmenopausal female with osteopenia here for B&P exam. Single. PCP: Geofm Glade PARAS, MD   She reports vaginal itching, frequent urination due to recent change in diuretics. She also reports worsening vaginal irritation following CT imaging. She reports occ urge incontinence prior to medication change but now she requires diapers and is always wet.  Urine sample provided: No  Postmenopausal bleeding: none Pelvic discharge or pain: yes Breast mass, nipple discharge or skin changes : none, prior mammoplasty Sexually active: Not currently   Last PAP:     Component Value Date/Time   DIAGPAP  10/27/2021 1149    - Negative for intraepithelial lesion or malignancy (NILM)   ADEQPAP  10/27/2021 1149    Satisfactory for evaluation; transformation zone component PRESENT.   Last mammogram: 11/05/23 Birads 2, density B Last DXA: 10/18/20 osteopenia Last colonoscopy: 01/23/19 q7y  Exercising: No Smoker:No  Flowsheet Row Office Visit from 04/13/2024 in [facility]  PHQ-2 Total Score 0    Flowsheet Row Office Visit from 03/30/2024 in [facility]  PHQ-9 Total Score 0     GYN HISTORY: [facility]- LR medicare  OB History  Gravida Para Term Preterm AB Living  0       SAB IAB Ectopic Multiple Live Births         Past Medical History:  Diagnosis Date   Ambulates with cane 11/29/2020   outside of home   ASTHMA NOS W/ACUTE EXACERBATION 07/10/2010   Cervical dysplasia    DEPRESSION 03/16/2007   DIABETES MELLITUS, TYPE II 12/01/2006   dx 2004   Dysmenorrhea    Fibroid    History of blood product transfusion    took own blood that was banked in 1990's   HYPERLIPIDEMIA 12/01/2006   HYPERTENSION 07/07/2006   Macular edema    Neuropathy    fingertips and tose some numbness, right foot numb at times   Obesity    Osteopenia 05/2017   T score -1.5 FRAX 2.6% / 0.1%   Pneumonia    1990's and 2000 none since    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 issuse resolved   Retinal edema    gets steriod inj in the eyes     Sleep apnea    not needed since 100 pound weight loss 2019-2020   Splenomegaly    in college   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   left side pt states, left wide weak per pt   Stroke [facility]) 2012   right pontine cva with left hemiparesis   Past Surgical History:  Procedure Laterality Date   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   APPENDECTOMY     yrs ago per pt 0n 11-29-2020   BIOPSY THYROID   05/02/2011   Nonneoplastic goiter   BREAST BIOPSY     BREAST LUMPECTOMY WITH RADIOACTIVE SEED LOCALIZATION Left 07/25/2017   Procedure: LEFT BREAST LUMPECTOMY WITH RADIOACTIVE SEED LOCALIZATION;  Surgeon: Vernetta Berg, MD;  [facility];  Service: General;  Laterality: Left;   BREAST SURGERY     Reduction   cataracts Bilateral 2012   COLONOSCOPY  2021   COLPOSCOPY     DILATATION & CURETTAGE/HYSTEROSCOPY WITH MYOSURE N/A 12/02/2020   Procedure: DILATATION & CURETTAGE/HYSTEROSCOPY WITH MYOSURE;  Surgeon: Lavoie, Marie-Lyne, MD;  Location: [facility];  Service: Gynecology;  Laterality: N/A;   DILATION AND CURETTAGE OF UTERUS  1975  DUB   EYE SURGERY     Laser for vision at times both eyes done   GASTRIC ROUX-EN-Y N/A 08/12/2017   Procedure: LAPAROSCOPIC ROUX-EN-Y GASTRIC BYPASS WITH HIATAL HERNIA REPAIR AND UPPER ENDOSCOPY;  Surgeon: [name], Herlene Righter, MD;  Location: [facility] ORS;  Service: General;  Laterality: N/A;   GYNECOLOGIC CRYOSURGERY     LEFT HEART CATH AND CORONARY ANGIOGRAPHY N/A 04/01/2024   Procedure: LEFT HEART CATH AND CORONARY ANGIOGRAPHY;  Surgeon: Jordan, Peter M, MD;  Location: [facility] INVASIVE CV LAB;  Service: Cardiovascular;  Laterality: N/A;   MYOMECTOMY     OVARIAN CYST REMOVAL     PANNICULECTOMY N/A 02/03/2024   Procedure: PANNICULECTOMY;  Surgeon: Waddell Leonce NOVAK, MD;  Location: [facility];  Service:  Plastics;  Laterality: N/A;   PELVIC LAPAROSCOPY     DL lysis of adhesions 8024 and 1988   Current Outpatient Medications on File Prior to Visit  Medication Sig Dispense Refill   albuterol  (PROVENTIL  HFA;VENTOLIN  HFA) 108 (90 Base) MCG/ACT inhaler Inhale 2 puffs into the lungs every 4 (four) hours as needed for wheezing or shortness of breath. 18 g 2   aspirin  EC 81 MG tablet Take 1 tablet (81 mg total) by mouth daily. 90 tablet 1   Biotin  5000 MCG TABS Take 10,000 mcg by mouth daily.      Cholecalciferol (VITAMIN D3 PO) Take 2,000 Units by mouth daily.     clopidogrel  (PLAVIX ) 75 MG tablet TAKE 1 TABLET BY MOUTH EVERY DAY 90 tablet 1   Continuous Glucose Receiver (FREESTYLE LIBRE 3 READER) DEVI Use to monitor glucose continuously. 1 each 0   Continuous Glucose Sensor (FREESTYLE LIBRE 3 PLUS SENSOR) MISC 1 each by Does not apply route every 14 (fourteen) days. 2 each 11   dapagliflozin  propanediol (FARXIGA ) 10 MG TABS tablet Take 1 tablet (10 mg total) by mouth daily before breakfast. 90 tablet 3   hydrALAZINE  (APRESOLINE ) 50 MG tablet Take 1 tablet (50 mg total) by mouth 3 (three) times daily. 90 tablet 0   isosorbide  mononitrate (IMDUR ) 30 MG 24 hr tablet Take 1 tablet (30 mg total) by mouth daily. 90 tablet 3   metoprolol  succinate (TOPROL -XL) 100 MG 24 hr tablet TAKE 1 TABLET BY MOUTH DAILY WITH OR IMMEDIATELY FOLLOWING A MEAL 90 tablet 1   montelukast  (SINGULAIR ) 10 MG tablet TAKE 1 TABLET BY MOUTH EVERY DAY 90 tablet 1   nystatin -triamcinolone  ointment (MYCOLOG) Apply topically as needed. 30 g 4   rosuvastatin  (CRESTOR ) 40 MG tablet Take 1 tablet (40 mg total) by mouth daily. 90 tablet 3   Semaglutide , 1 MG/DOSE, (OZEMPIC , 1 MG/DOSE,) 4 MG/3ML SOPN Inject 1 mg into the skin once a week. 9 mL 3   spironolactone  (ALDACTONE ) 25 MG tablet Take 1 tablet (25 mg total) by mouth daily. 90 tablet 3   tacrolimus  (PROTOPIC ) 0.1 % ointment Apply topically 2 (two) times daily. 30 g 2   torsemide   (DEMADEX ) 20 MG tablet Take 0.5-1 tablets (10-20 mg total) by mouth daily as needed.     valsartan  (DIOVAN ) 320 MG tablet Take 0.5 tablets (160 mg total) by mouth daily.     vitamin B-12 (CYANOCOBALAMIN ) 100 MCG tablet Take 100 mcg by mouth daily.     No current facility-administered medications on file prior to visit.   Social History   Socioeconomic History   Marital status: Single    Spouse name: Not on file   Number of children: 2   Years  of education: 16   Highest education level: Master's degree (e.g., MA, MS, MEng, MEd, MSW, MBA)  Occupational History   Occupation: TEACHER    Employer: [employer]	[place]  Tobacco Use   Smoking status: Never   Smokeless tobacco: Never  Vaping Use   Vaping status: Never Used  Substance and Sexual Activity   Alcohol  use: Yes    Alcohol /week: 1.0 standard drink of alcohol     Types: 1 Glasses of wine per week    Comment: occas   Drug use: Never   Sexual activity: Not Currently    Birth control/protection: Post-menopausal    Comment: older than 16, less than 5  Other Topics Concern   Not on file  Social History Narrative   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   Living with daughter      Right handed      4 steps inside of the home      Social Drivers of Health   Financial Resource Strain: Low Risk  (03/16/2024)   Overall Financial Resource Strain (CARDIA)    Difficulty of Paying Living Expenses: Not hard at all  Food Insecurity: No Food Insecurity (03/16/2024)   Hunger Vital Sign    Worried About Running Out of Food in the Last Year: Never true    Ran Out of Food in the Last Year: Never true  Transportation Needs: No Transportation Needs (03/16/2024)   PRAPARE - Administrator, Civil Service (Medical): No    Lack of Transportation (Non-Medical): No  Physical Activity: Insufficiently Active (03/16/2024)   Exercise Vital Sign    Days of Exercise per Week: 2  days    Minutes of Exercise per Session: 40 min  Stress: No Stress Concern Present (03/16/2024)   [employer] of Occupational Health - Occupational Stress Questionnaire    Feeling of Stress: Not at all  Social Connections: Moderately Integrated (03/16/2024)   Social Connection and Isolation Panel    Frequency of Communication with Friends and Family: More than three times a week    Frequency of Social Gatherings with Friends and Family: More than three times a week    Attends Religious Services: More than 4 times per year    Active Member of [employer] or Organizations: Yes    Attends Engineer, Structural: More than 4 times per year    Marital Status: Never married  Recent Concern: Social Connections - Moderately Isolated (01/24/2024)   Social Connection and Isolation Panel    Frequency of Communication with Friends and Family: More than three times a week    Frequency of Social Gatherings with Friends and Family: Three times a week    Attends Religious Services: More than 4 times per year    Active Member of Clubs or Organizations: No    Attends Banker Meetings: Not on file    Marital Status: Never married  Intimate Partner Violence: Not At Risk (01/22/2024)   Humiliation, Afraid, Rape, and Kick questionnaire    Fear of Current or Ex-Partner: No    Emotionally Abused: No    Physically Abused: No    Sexually Abused: No   Family History  Problem Relation Age of Onset   Hypertension Mother    Heart disease Mother    Ovarian cancer Mother    Cancer Mother        Lung cancer   Asthma Mother  COPD Mother    Hyperlipidemia Mother    Diabetes Father    Heart disease Father    Hypertension Father    Hyperlipidemia Father    Stroke Father        passed at age 23 from stroke   Graves' disease Sister    Diabetes Sister    Breast cancer Sister 52   Stroke Brother    Heart failure Brother    Diabetes Brother    Hypertension Brother    Heart failure Brother     Prostate cancer Brother    Diabetes Brother    Asthma Brother    Hypertension Brother    Kidney disease Brother    Heart failure Brother    Prostate cancer Brother    Stroke Brother    Hypertension Brother    Cancer Maternal Grandmother        Colon Cancer   Asthma Maternal Grandmother    Colon cancer Maternal Grandmother    Cancer Maternal Grandfather    Graves' disease Paternal Grandmother    Hypertension Paternal Grandmother    Heart disease Paternal Grandmother    Alzheimer's disease Paternal Grandmother    Allergies  Allergen Reactions   Quinapril Cough   Amlodipine  Other (See Comments)    edema   Metformin And Related Diarrhea    Loose stool      PE Today's Vitals   04/13/24 1548  BP: (!) 158/64  Pulse: 96  Temp: 98.5 F (36.9 C)  TempSrc: Oral  SpO2: 98%  Weight: 137 lb (62.1 kg)  Height: 5' 4.25 (1.632 m)   Body mass index is 23.33 kg/m.  Physical Exam Vitals reviewed. Exam conducted with a chaperone present.  Constitutional:      General: She is not in acute distress.    Appearance: Normal appearance.  HENT:     Head: Normocephalic and atraumatic.     Nose: Nose normal.  Eyes:     Extraocular Movements: Extraocular movements intact.     Conjunctiva/sclera: Conjunctivae normal.  Pulmonary:     Effort: Pulmonary effort is normal.  Chest:     Chest wall: No mass or tenderness.  Breasts:    Right: Normal. No swelling, mass, nipple discharge, skin change or tenderness.     Left: Normal. No swelling, mass, nipple discharge, skin change or tenderness.     Comments: +BL mammoplasty scars Abdominal:     General: There is no distension.     Palpations: Abdomen is soft.     Tenderness: There is no abdominal tenderness.  Genitourinary:    Exam position: Lithotomy position.     Labia:        Right: Rash present.        Left: Rash present.      Urethra: No prolapse.     Vagina: Normal. No vaginal discharge or bleeding.     Uterus: Normal. Not  enlarged and not tender.      Adnexa: Right adnexa normal and left adnexa normal.     Comments: Significant vulvovaginal atrophy Cervix atrophied, flush with vaginal wall Musculoskeletal:        General: Normal range of motion.     Cervical back: Normal range of motion.  Lymphadenopathy:     Upper Body:     Right upper body: No axillary adenopathy.     Left upper body: No axillary adenopathy.     Lower Body: No right inguinal adenopathy. No left inguinal adenopathy.  Skin:  General: Skin is warm and dry.     Comments: +yeast like rash of inframammary fold and crural folds  Neurological:     General: No focal deficit present.     Mental Status: She is alert.  Psychiatric:        Mood and Affect: Mood normal.        Behavior: Behavior normal.      Assessment and Plan:        Encounter for breast and pelvic examination Assessment & Plan: Cervical cancer screening performed according to ASCCP guidelines. Encouraged annual mammogram screening Colonoscopy UTD DXA due Labs and immunizations with her primary Encouraged safe sexual practices as indicated Encouraged healthy lifestyle practices with diet and exercise For patients under 50-70yo, I recommend 1200mg  calcium  daily and 600IU of vitamin D  daily.    Cervical cancer screening -     Cytology - PAP  Osteopenia after menopause -     DG Bone Density; Future  Negative depression screening  Vagina itching -     WET PREP FOR TRICH, YEAST, CLUE  BV (bacterial vaginosis) -     metroNIDAZOLE ; Take 1 tablet (500 mg total) by mouth 2 (two) times daily for 7 days.  Dispense: 14 tablet; Refill: 0  Candidal intertrigo -     Fluconazole ; Take 1 tablet (150 mg total) by mouth every 3 (three) days for 3 doses.  Dispense: 3 tablet; Refill: 0  OAB (overactive bladder) -     Ambulatory referral to Physical Therapy  03/02/24 UA non infectious Discussed treatment with medications vs PFPT Patient elected for PFPT  Vera LULLA Pa, MD

## 2024-04-13 NOTE — Assessment & Plan Note (Signed)
 Cervical cancer screening performed according to ASCCP guidelines. Encouraged annual mammogram screening Colonoscopy UTD DXA due Labs and immunizations with her primary Encouraged safe sexual practices as indicated Encouraged healthy lifestyle practices with diet and exercise For patients under 50-67yo, I recommend 1200mg  calcium daily and 600IU of vitamin D daily.

## 2024-04-13 NOTE — Patient Instructions (Signed)
 For patients under 50-67yo, I recommend 1200mg  calcium  daily and 600IU of vitamin D daily. For patients over 67yo, I recommend 1200mg  calcium  daily and 800IU of vitamin D daily.  Health Maintenance, Female Adopting a healthy lifestyle and getting preventive care are important in promoting health and wellness. Ask your health care provider about: The right schedule for you to have regular tests and exams. Things you can do on your own to prevent diseases and keep yourself healthy. What should I know about diet, weight, and exercise? Eat a healthy diet  Eat a diet that includes plenty of vegetables, fruits, low-fat dairy products, and lean protein. Do not eat a lot of foods that are high in solid fats, added sugars, or sodium. Maintain a healthy weight Body mass index (BMI) is used to identify weight problems. It estimates body fat based on height and weight. Your health care provider can help determine your BMI and help you achieve or maintain a healthy weight. Get regular exercise Get regular exercise. This is one of the most important things you can do for your health. Most adults should: Exercise for at least 150 minutes each week. The exercise should increase your heart rate and make you sweat (moderate-intensity exercise). Do strengthening exercises at least twice a week. This is in addition to the moderate-intensity exercise. Spend less time sitting. Even light physical activity can be beneficial. Watch cholesterol and blood lipids Have your blood tested for lipids and cholesterol at 67 years of age, then have this test every 5 years. Have your cholesterol levels checked more often if: Your lipid or cholesterol levels are high. You are older than 67 years of age. You are at high risk for heart disease. What should I know about cancer screening? Depending on your health history and family history, you may need to have cancer screening at various ages. This may include screening  for: Breast cancer. Cervical cancer. Colorectal cancer. Skin cancer. Lung cancer. What should I know about heart disease, diabetes, and high blood pressure? Blood pressure and heart disease High blood pressure causes heart disease and increases the risk of stroke. This is more likely to develop in people who have high blood pressure readings or are overweight. Have your blood pressure checked: Every 3-5 years if you are 25-57 years of age. Every year if you are 24 years old or older. Diabetes Have regular diabetes screenings. This checks your fasting blood sugar level. Have the screening done: Once every three years after age 62 if you are at a normal weight and have a low risk for diabetes. More often and at a younger age if you are overweight or have a high risk for diabetes. What should I know about preventing infection? Hepatitis B If you have a higher risk for hepatitis B, you should be screened for this virus. Talk with your health care provider to find out if you are at risk for hepatitis B infection. Hepatitis C Testing is recommended for: Everyone born from [birth year] through 1965. Anyone with known risk factors for hepatitis C. Sexually transmitted infections (STIs) Get screened for STIs, including gonorrhea and chlamydia, if: You are sexually active and are younger than 67 years of age. You are older than 67 years of age and your health care provider tells you that you are at risk for this type of infection. Your sexual activity has changed since you were last screened, and you are at increased risk for chlamydia or gonorrhea. Ask your health care provider if  you are at risk. Ask your health care provider about whether you are at high risk for HIV. Your health care provider may recommend a prescription medicine to help prevent HIV infection. If you choose to take medicine to prevent HIV, you should first get tested for HIV. You should then be tested every 3 months for as long as you  are taking the medicine. Osteoporosis and menopause Osteoporosis is a disease in which the bones lose minerals and strength with aging. This can result in bone fractures. If you are 72 years old or older, or if you are at risk for osteoporosis and fractures, ask your health care provider if you should: Be screened for bone loss. Take a calcium  or vitamin D supplement to lower your risk of fractures. Be given hormone replacement therapy (HRT) to treat symptoms of menopause. Follow these instructions at home: Alcohol use Do not drink alcohol if: Your health care provider tells you not to drink. You are pregnant, may be pregnant, or are planning to become pregnant. If you drink alcohol: Limit how much you have to: 0-1 drink a day. Know how much alcohol is in your drink. In the [place] one drink equals one 12 oz bottle of beer (355 mL), one 5 oz glass of wine (148 mL), or one 1 oz glass of hard liquor (44 mL). Lifestyle Do not use any products that contain nicotine or tobacco. These products include cigarettes, chewing tobacco, and vaping devices, such as e-cigarettes. If you need help quitting, ask your health care provider. Do not use street drugs. Do not share needles. Ask your health care provider for help if you need support or information about quitting drugs. General instructions Schedule regular health, dental, and eye exams. Stay current with your vaccines. Tell your health care provider if: You often feel depressed. You have ever been abused or do not feel safe at home. Summary Adopting a healthy lifestyle and getting preventive care are important in promoting health and wellness. Follow your health care provider's instructions about healthy diet, exercising, and getting tested or screened for diseases. Follow your health care provider's instructions on monitoring your cholesterol and blood pressure. This information is not intended to replace advice given to you by your health  care provider. Make sure you discuss any questions you have with your health care provider. Document Revised: 09/26/2020 Document Reviewed: 09/26/2020 Elsevier Patient Education  2024 [employer].

## 2024-04-13 NOTE — Telephone Encounter (Signed)
-----   Message from Vera LULLA Pa sent at 04/13/2024  4:31 PM EST ----- Please help patient schedule DXA

## 2024-04-14 ENCOUNTER — Encounter: Payer: Self-pay | Admitting: Internal Medicine

## 2024-04-14 ENCOUNTER — Ambulatory Visit: Payer: Self-pay | Admitting: Obstetrics and Gynecology

## 2024-04-15 ENCOUNTER — Ambulatory Visit (HOSPITAL_COMMUNITY)
Admission: RE | Admit: 2024-04-15 | Discharge: 2024-04-15 | Disposition: A | Discharge status: Home / Self Care | Source: Ambulatory Visit | Attending: Cardiology | Admitting: Cardiology

## 2024-04-15 DIAGNOSIS — E7849 Other hyperlipidemia: Secondary | ICD-10-CM | POA: Diagnosis not present

## 2024-04-15 DIAGNOSIS — Z01812 Encounter for preprocedural laboratory examination: Secondary | ICD-10-CM | POA: Diagnosis not present

## 2024-04-15 DIAGNOSIS — I2510 Atherosclerotic heart disease of native coronary artery without angina pectoris: Secondary | ICD-10-CM | POA: Insufficient documentation

## 2024-04-15 DIAGNOSIS — Z01810 Encounter for preprocedural cardiovascular examination: Secondary | ICD-10-CM | POA: Diagnosis not present

## 2024-04-15 LAB — ECHOCARDIOGRAM COMPLETE
Area-P 1/2: 4.74 cm2
S' Lateral: 2.6 cm

## 2024-04-15 LAB — CYTOLOGY - PAP
Comment: NEGATIVE
Diagnosis: UNDETERMINED — AB
High risk HPV: NEGATIVE

## 2024-04-20 NOTE — Progress Notes (Unsigned)
 Cardiology Office Note:    Date:  04/27/2024   ID:  Rebekah Kennedy [place], DOB 12/18/1955, MRN [id number]  PCP:  Geofm Glade PARAS, MD   [facility] Providers Cardiologist:  None     Referring MD: Geofm Glade PARAS, MD   Chief Complaint  Patient presents with   Coronary Artery Disease    History of Present Illness:    Rebekah Kennedy is a 68 y.o. female seen for follow up of CAD. She has a history of DM, HTN, HLD, TIAs.  Also history of hemorrhagic stroke and obesity. Echo in 2020 showed mild LVH with normal EF and normal valves. Was seen recently with some leg edema. Troponin was tested and was 46 and 32. Coronary CTA was done with calcium  score of 1484. Multivessel obstructive CAD.   She recently underwent panniculectomy in September without complications. Later developed a blister on her right leg and swelling bilaterally leading to above evaluation. Does notes some improvement in edema on torsemide   She underwent evaluation with Echo which was normal. Cardiac cath confirmed severe CAD involving the LAD and first diagonal, distal RCA and PDA. Was not felt to be suitable for PCI. Discussed at our heart team meeting and given lack of symptoms medical therapy was recommended. Consider for CABG if she develops symptoms on medical therapy  She denies any symptoms of chest pain or pressure. Tolerating medication well. No problems from her cath procedure.  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 issuse resolved   Retinal edema    gets steriod inj in the eyes     Sleep apnea    not needed since 100 pound weight loss 2019-2020   Splenomegaly    in college   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   left side pt states, left wide weak per pt   Stroke [facility]) 2012   right pontine cva with left hemiparesis    Past Surgical History:  Procedure Laterality Date   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   APPENDECTOMY     yrs ago per pt 0n 11-29-2020   BIOPSY THYROID   05/02/2011   Nonneoplastic goiter   BREAST BIOPSY     BREAST LUMPECTOMY WITH RADIOACTIVE SEED LOCALIZATION Left 07/25/2017   Procedure: LEFT BREAST LUMPECTOMY WITH RADIOACTIVE SEED LOCALIZATION;  Surgeon: Vernetta Berg, MD;  Location: [facility] OR;  Service: General;  Laterality: Left;   BREAST SURGERY     Reduction   cataracts Bilateral 2012   COLONOSCOPY  2021   COLPOSCOPY     DILATATION & CURETTAGE/HYSTEROSCOPY WITH MYOSURE N/A 12/02/2020   Procedure: DILATATION & CURETTAGE/HYSTEROSCOPY WITH MYOSURE;  Surgeon: Lavoie, Marie-Lyne, MD;  Location: [facility];  Service: Gynecology;  Laterality: N/A;   DILATION AND CURETTAGE OF UTERUS  1975   DUB   EYE SURGERY     Laser for vision at times both eyes done   GASTRIC ROUX-EN-Y N/A 08/12/2017   Procedure: LAPAROSCOPIC ROUX-EN-Y GASTRIC BYPASS WITH HIATAL HERNIA REPAIR AND UPPER ENDOSCOPY;  Surgeon: [name], Herlene Righter, MD;  Location: [facility] ORS;  Service: General;  Laterality: N/A;   GYNECOLOGIC CRYOSURGERY     LEFT HEART CATH AND CORONARY ANGIOGRAPHY N/A 04/01/2024   Procedure: LEFT HEART CATH AND CORONARY ANGIOGRAPHY;  Surgeon: Mirian Casco M, MD;  Location: [facility] INVASIVE CV LAB;  Service: Cardiovascular;  Laterality: N/A;   MYOMECTOMY     OVARIAN CYST REMOVAL     PANNICULECTOMY N/A 02/03/2024   Procedure: PANNICULECTOMY;  Surgeon:  Waddell Leonce NOVAK, MD;  Location: [facility];  Service: Plastics;  Laterality: N/A;   PELVIC LAPAROSCOPY     DL lysis of adhesions 8024 and 1988    Current Medications: Current Meds  Medication Sig   albuterol  (PROVENTIL  HFA;VENTOLIN  HFA) 108 (90 Base) MCG/ACT inhaler Inhale 2 puffs into the lungs every 4 (four) hours as needed for wheezing or shortness of breath.   aspirin  EC 81 MG tablet Take 1 tablet (81 mg total) by mouth daily.   Biotin  5000 MCG TABS Take 10,000 mcg by mouth daily.    Cholecalciferol (VITAMIN D3 PO) Take 2,000 Units by mouth daily.   clopidogrel  (PLAVIX ) 75 MG tablet TAKE 1 TABLET BY MOUTH EVERY DAY   Continuous Glucose Receiver (FREESTYLE LIBRE 3 READER) DEVI Use to monitor glucose continuously.   Continuous Glucose Sensor (FREESTYLE LIBRE 3 PLUS SENSOR) MISC 1 each by Does not apply route every 14 (fourteen) days.   dapagliflozin  propanediol (FARXIGA ) 10 MG TABS tablet Take 1 tablet (10 mg total) by mouth daily before breakfast.   fluconazole  (DIFLUCAN ) 150 MG tablet Take 1 tablet (150 mg total) by mouth every 3 (three) days for 3 doses.   hydrALAZINE  (APRESOLINE ) 50 MG tablet Take 1 tablet (50 mg total) by mouth 3 (three) times daily.   isosorbide  mononitrate (IMDUR ) 30 MG 24 hr tablet Take 1 tablet (30 mg total) by mouth daily.   metoprolol  succinate (TOPROL -XL) 100 MG 24 hr tablet TAKE 1 TABLET BY MOUTH DAILY WITH OR IMMEDIATELY FOLLOWING A MEAL   metroNIDAZOLE  (FLAGYL ) 500 MG tablet Take 1 tablet (500 mg total) by mouth 2 (two) times daily for 7 days.   montelukast  (SINGULAIR ) 10 MG tablet TAKE 1 TABLET BY MOUTH EVERY DAY   nystatin -triamcinolone  ointment (MYCOLOG) Apply topically as needed.   rosuvastatin  (CRESTOR ) 40 MG tablet Take 1 tablet (40 mg total) by mouth daily.   Semaglutide , 1 MG/DOSE, (OZEMPIC , 1 MG/DOSE,) 4 MG/3ML SOPN Inject 1 mg into the skin once a week.   spironolactone  (ALDACTONE ) 25 MG tablet Take 1 tablet (25 mg total) by mouth  daily.   tacrolimus  (PROTOPIC ) 0.1 % ointment Apply topically 2 (two) times daily.   torsemide  (DEMADEX ) 20 MG tablet Take 0.5-1 tablets (10-20 mg total) by mouth daily as needed.   valsartan  (DIOVAN ) 320 MG tablet Take 0.5 tablets (160 mg total) by mouth daily.   vitamin B-12 (CYANOCOBALAMIN ) 100 MCG tablet Take 100 mcg by mouth daily.     Allergies:   Quinapril, Amlodipine , and Metformin and related   Social History   Socioeconomic History   Marital status: Single    Spouse name: Not on file   Number of children: 2  Years of education: 58   Highest education level: Master's degree (e.g., MA, MS, MEng, MEd, MSW, MBA)  Occupational History   Occupation: TEACHER    Employer: [employer]	[place]  Tobacco Use   Smoking status: Never   Smokeless tobacco: Never  Vaping Use   Vaping status: Never Used  Substance and Sexual Activity   Alcohol  use: Yes    Alcohol /week: 1.0 standard drink of alcohol     Types: 1 Glasses of wine per week    Comment: occas   Drug use: Never   Sexual activity: Not Currently    Birth control/protection: Post-menopausal    Comment: older than 16, less than 5  Other Topics Concern   Not on file  Social History Narrative   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   Living with daughter      Right handed      4 steps inside of the home      Social Drivers of Health   Financial Resource Strain: Low Risk  (03/16/2024)   Overall Financial Resource Strain (CARDIA)    Difficulty of Paying Living Expenses: Not hard at all  Food Insecurity: No Food Insecurity (03/16/2024)   Hunger Vital Sign    Worried About Running Out of Food in the Last Year: Never true    Ran Out of Food in the Last Year: Never true  Transportation Needs: No Transportation Needs (03/16/2024)   PRAPARE - Administrator, Civil Service (Medical): No    Lack of Transportation (Non-Medical): No  Physical Activity:  Insufficiently Active (03/16/2024)   Exercise Vital Sign    Days of Exercise per Week: 2 days    Minutes of Exercise per Session: 40 min  Stress: No Stress Concern Present (03/16/2024)   [employer] of Occupational Health - Occupational Stress Questionnaire    Feeling of Stress: Not at all  Social Connections: Moderately Integrated (03/16/2024)   Social Connection and Isolation Panel    Frequency of Communication with Friends and Family: More than three times a week    Frequency of Social Gatherings with Friends and Family: More than three times a week    Attends Religious Services: More than 4 times per year    Active Member of [employer] or Organizations: Yes    Attends Engineer, Structural: More than 4 times per year    Marital Status: Never married  Recent Concern: Social Connections - Moderately Isolated (01/24/2024)   Social Connection and Isolation Panel    Frequency of Communication with Friends and Family: More than three times a week    Frequency of Social Gatherings with Friends and Family: Three times a week    Attends Religious Services: More than 4 times per year    Active Member of Clubs or Organizations: No    Attends Engineer, Structural: Not on file    Marital Status: Never married     Family History: The patient's family history includes Alzheimer's disease in her paternal grandmother; Asthma in her brother, maternal grandmother, and mother; Breast cancer (age of onset: 60) in her sister; COPD in her mother; Cancer in her maternal grandfather, maternal grandmother, and mother; Colon cancer in her maternal grandmother; Diabetes in her brother, brother, father, and sister; Yvone' disease in her paternal grandmother and sister; Heart disease in her father, mother, and paternal grandmother; Heart failure in her brother, brother, and brother; Hyperlipidemia in her  father and mother; Hypertension in her brother, brother, brother, father, mother, and paternal  grandmother; Kidney disease in her brother; Ovarian cancer in her mother; Prostate cancer in her brother and brother; Stroke in her brother, brother, and father.  ROS:   Please see the history of present illness.     All other systems reviewed and are negative.  EKGs/Labs/Other Studies Reviewed:    The following studies were reviewed today: Echo 07/08/18: IMPRESSIONS     1. The left ventricle has normal systolic function with an ejection  fraction of 60-65%. The cavity size was normal. There is mild concentric  left ventricular hypertrophy. Left ventricular diastolic Doppler  parameters are indeterminate No evidence of  left ventricular regional wall motion abnormalities.   2. No intracardiac thrombi or masses were visualized.   3. The right ventricle has normal systolic function. The cavity was  normal. There is no increase in right ventricular wall thickness. Right  ventricular systolic pressure could not be assessed.   4. The mitral valve is normal in structure.   5. The tricuspid valve is normal in structure.   6. The aortic valve is tricuspid Mild thickening of the aortic valve Mild  calcification of the aortic valve.   7. The pulmonic valve was normal in structure.   8. No evidence of left ventricular regional wall motion abnormalities.   9. Right atrial pressure is estimated at 3 mmHg.   Coronary CTA 03/12/24: Cardiac CTA   MEDICATIONS: Sub lingual nitro. 4mg  x 2   TECHNIQUE: A non-contrast, gated CT scan was obtained with axial slices of 2.5 mm through the heart for calcium  scoring. Calcium  scoring was performed using the Agatston method. A 120 kV prospective, gated, contrast cardiac CT scan was obtained. Gantry rotation speed was 230 msec and collimation was 0.63 mm. Two sublingual nitroglycerin  tablets (0.8 mg) were given. The 3D data set was reconstructed with motion correction for the best systolic or diastolic phase. Images were analyzed on a dedicated  workstation using MPR, MIP, and VRT modes. The patient received 95 cc contrast.   FINDINGS: Non-cardiac: See separate report from [facility].   Normal caliber aortic root and ascending aorta. No LA appendage thrombus. Pulmonary veins drain normally to the left atrium.   Calcium  Score: 1484 AU   Coronary Arteries: Right dominant with no anomalies   LM: Mixed plaque with mild (1-24%) stenosis.   LAD system: Moderate D1 with mixed plaque proximally, severe (70-99%) stenosis. There is extensive mixed plaque in the proximal to mid LAD, there appears to be diffuse moderate (50-69%) stenosis. FFR 0.68 in the distal LAD suggests that the disease in the proximal to mid LAD is hemodynamically significant.   Circumflex system: Relatively small LCx appears severely stenosed (70-99%) in the mid vessel. CT FFR 0.71 in the distal LCx.   RCA system: Extensive calcified plaque in the proximal RCA, mild (25-49%) stenosis. Mixed plaque distal LAD with severe (70-99%) stenosis. CT FFR 0.58 beyond the distal RCA stenosis.   IMPRESSION: 1. Coronary artery calcium  score 1484 AU. This places the patient in the 99th percentile for age and gender, suggesting high risk for future cardiac events.   2.  Severe stenosis in the distal RCA confirmed by CT FFR.   3. Severe stenosis in the mid LCx (small vessel) confirmed by CT FFR.   4.  Severe stenosis proximal D1.   5. Moderate stenosis diffusely in the proximal to mid LAD, the totality of this disease appears hemodynamically significant  by CT FFR in the distal LAD.   Recommend evaluation by cardiac catheterization.   [employer] Account Executive   Electronically Signed: By: Ezra Shuck M.D. On: 03/12/2024 21:25       Cardiac cath 04/01/24:  LEFT HEART CATH AND CORONARY ANGIOGRAPHY   Conclusion      Dist RCA lesion is 85% stenosed.   RPDA lesion is 100% stenosed.   Prox LAD to Mid LAD lesion is 80% stenosed.   1st Diag-1 lesion is 90%  stenosed with 90% stenosed side branch in Lat 1st Diag.   1st Diag-2 lesion is 90% stenosed.   The left ventricular systolic function is normal.   LV end diastolic pressure is normal.   The left ventricular ejection fraction is 55-65% by visual estimate.   Severe multivessel obstructive CAD Normal LV function Normal LVEDP 12 mm Hg   Plan: continue medical therapy. Will discuss at heart team conference.   Coronary Diagrams  Diagnostic Dominance: Right  Intervention  Echo 04/15/24: IMPRESSIONS     1. Left ventricular ejection fraction, by estimation, is 60 to 65%. Left  ventricular ejection fraction by 3D volume is 60 %. The left ventricle has  normal function. The left ventricle has no regional wall motion  abnormalities. Left ventricular diastolic   parameters are indeterminate. The average left ventricular global  longitudinal strain is -21.6 %. The global longitudinal strain is normal.   2. Right ventricular systolic function is normal. The right ventricular  size is normal. There is normal pulmonary artery systolic pressure.   3. The mitral valve is normal in structure. Trivial mitral valve  regurgitation. No evidence of mitral stenosis.   4. There is a calcified nodule of Arantius on the non-coronary cusp of  the aortic valve. The aortic valve is tricuspid. Aortic valve  regurgitation is not visualized. No aortic stenosis is present.   5. The inferior vena cava is normal in size with greater than 50%  respiratory variability, suggesting right atrial pressure of 3 mmHg.   Comparison(s): No significant change from prior study.   Conclusion(s)/Recommendation(s): Otherwise normal echocardiogram, with  minor abnormalities described in the report. Nodular calcification of the  aortic valve is a benign finding.  Recent Labs: 03/02/2024: Pro B Natriuretic peptide (BNP) 40.0; TSH 1.32 03/25/2024: ALT 26; BUN 21; Creatinine, Ser 0.83; Hemoglobin 12.8; Platelets 323; Potassium  3.9; Sodium 141  Recent Lipid Panel    Component Value Date/Time   CHOL 195 03/25/2024 0850   TRIG 93 03/25/2024 0850   HDL 110 03/25/2024 0850   CHOLHDL 1.8 03/25/2024 0850   CHOLHDL 2 05/29/2023 0923   VLDL 14.4 05/29/2023 0923   LDLCALC 69 03/25/2024 0850   LDLDIRECT 126.7 03/24/2013 1521     Risk Assessment/Calculations:        Physical Exam:    VS:  BP (!) 160/70   Pulse 72   Ht 5' 4 (1.626 m)   Wt 136 lb 6.4 oz (61.9 kg)   SpO2 97%   BMI 23.41 kg/m     Wt Readings from Last 3 Encounters:  04/27/24 136 lb 6.4 oz (61.9 kg)  04/13/24 137 lb (62.1 kg)  04/01/24 132 lb (59.9 kg)    BP 128/70 in both arms.  GEN:  Well nourished, well developed in no acute distress HEENT: Normal NECK: No JVD; No carotid bruits LYMPHATICS: No lymphadenopathy CARDIAC: RRR, no murmurs, rubs, gallops. Left radial pulse is poor. Femoral pulses 2+ no bruits.  RESPIRATORY:  Clear to auscultation  without rales, wheezing or rhonchi  ABDOMEN: Soft, non-tender, non-distended MUSCULOSKELETAL:  1+ edema; No deformity, blister has healed. SKIN: Warm and dry NEUROLOGIC:  Alert and oriented x 3 PSYCHIATRIC:  Normal affect   ASSESSMENT:    1. Coronary artery disease of native artery of native heart with stable angina pectoris   2. Hyperlipidemia associated with type 2 diabetes mellitus (HCC)   3. Primary hypertension   4. Type 2 diabetes mellitus without complication, without long-term current use of insulin  (HCC)     PLAN:    In order of problems listed above:  CAD with high risk coronary CTA demonstrating multivessel obstructive disease. Remarkably no significant symptoms but is on good medical therapy. Cardiac cath confirms severe CAD. No good options for PCI. Case discussed at our multidisciplinary conference. Recommendation is to continue medical therapy. If she were to develop symptoms then CABG would be her best option.  HTN controlled DM type 2 HLD on statin. Last LDL69. Crestor   increased to 40 mg with goal LDL < 55. Will plan update labs when she sees Dr Geofm next month.  LE edema. Normal BNP and Echo. Plan diuretic PRN.        Medication Adjustments/Labs and Tests Ordered: Current medicines are reviewed at length with the patient today.  Concerns regarding medicines are outlined above.  No orders of the defined types were placed in this encounter.  No orders of the defined types were placed in this encounter.   Patient Instructions  Medication Instructions:  Continue same medications *If you need a refill on your cardiac medications before your next appointment, please call your pharmacy*  Lab Work: None ordered  Testing/Procedures: None ordered  Follow-Up: At [facility], you and your health needs are our priority.  As part of our continuing mission to provide you with exceptional heart care, our providers are all part of one team.  This team includes your primary Cardiologist (physician) and Advanced Practice Providers or APPs (Physician Assistants and Nurse Practitioners) who all work together to provide you with the care you need, when you need it.  Your next appointment:  4 months  Call in Feb to schedule April appointment     Provider:  Dr.Ailed Defibaugh   We recommend signing up for the patient portal called MyChart.  Sign up information is provided on this After Visit Summary.  MyChart is used to connect with patients for Virtual Visits (Telemedicine).  Patients are able to view lab/test results, encounter notes, upcoming appointments, etc.  Non-urgent messages can be sent to your provider as well.   To learn more about what you can do with MyChart, go to [email or web address].     Signed, Glennice Marcos, MD  04/27/2024 12:48 PM    [facility]

## 2024-04-22 DIAGNOSIS — E1122 Type 2 diabetes mellitus with diabetic chronic kidney disease: Secondary | ICD-10-CM | POA: Diagnosis not present

## 2024-04-22 DIAGNOSIS — N189 Chronic kidney disease, unspecified: Secondary | ICD-10-CM | POA: Diagnosis not present

## 2024-04-22 DIAGNOSIS — R809 Proteinuria, unspecified: Secondary | ICD-10-CM | POA: Diagnosis not present

## 2024-04-22 DIAGNOSIS — I129 Hypertensive chronic kidney disease with stage 1 through stage 4 chronic kidney disease, or unspecified chronic kidney disease: Secondary | ICD-10-CM | POA: Diagnosis not present

## 2024-04-22 DIAGNOSIS — I739 Peripheral vascular disease, unspecified: Secondary | ICD-10-CM | POA: Diagnosis not present

## 2024-04-23 LAB — LAB REPORT - SCANNED: EGFR: 97

## 2024-04-27 ENCOUNTER — Encounter: Payer: Self-pay | Admitting: Cardiology

## 2024-04-27 ENCOUNTER — Ambulatory Visit: Attending: Cardiology | Admitting: Cardiology

## 2024-04-27 VITALS — BP 160/70 | HR 72 | Ht 64.0 in | Wt 136.4 lb

## 2024-04-27 DIAGNOSIS — E1169 Type 2 diabetes mellitus with other specified complication: Secondary | ICD-10-CM | POA: Diagnosis not present

## 2024-04-27 DIAGNOSIS — E785 Hyperlipidemia, unspecified: Secondary | ICD-10-CM | POA: Diagnosis not present

## 2024-04-27 DIAGNOSIS — E119 Type 2 diabetes mellitus without complications: Secondary | ICD-10-CM | POA: Diagnosis not present

## 2024-04-27 DIAGNOSIS — I25118 Atherosclerotic heart disease of native coronary artery with other forms of angina pectoris: Secondary | ICD-10-CM

## 2024-04-27 DIAGNOSIS — I10 Essential (primary) hypertension: Secondary | ICD-10-CM

## 2024-04-27 NOTE — Patient Instructions (Addendum)
 Medication Instructions:  Continue same medications *If you need a refill on your cardiac medications before your next appointment, please call your pharmacy*  Lab Work: None ordered  Testing/Procedures: None ordered  Follow-Up: At [facility], you and your health needs are our priority.  As part of our continuing mission to provide you with exceptional heart care, our providers are all part of one team.  This team includes your primary Cardiologist (physician) and Advanced Practice Providers or APPs (Physician Assistants and Nurse Practitioners) who all work together to provide you with the care you need, when you need it.  Your next appointment:  4 months  Call in Feb to schedule April appointment     Provider:  Dr.Jordan   We recommend signing up for the patient portal called MyChart.  Sign up information is provided on this After Visit Summary.  MyChart is used to connect with patients for Virtual Visits (Telemedicine).  Patients are able to view lab/test results, encounter notes, upcoming appointments, etc.  Non-urgent messages can be sent to your provider as well.   To learn more about what you can do with MyChart, go to [email or web address].

## 2024-05-21 NOTE — Therapy (Incomplete)
 " OUTPATIENT PHYSICAL THERAPY FEMALE PELVIC EVALUATION   Patient Name: Rebekah Kennedy MRN: [id number] DOB:04/05/1956, 69 y.o., female Today's Date: 05/21/2024  END OF SESSION:   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 issuse resolved   Retinal edema    gets steriod inj in the eyes     Sleep apnea    not needed since 100 pound weight loss 2019-2020   Splenomegaly    in college   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   left side pt states, left wide weak per pt   Stroke [facility]) 2012   right pontine cva with left hemiparesis   Past Surgical History:  Procedure Laterality Date   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   APPENDECTOMY     yrs ago per pt 0n 11-29-2020   BIOPSY THYROID   05/02/2011   Nonneoplastic goiter   BREAST BIOPSY     BREAST LUMPECTOMY WITH RADIOACTIVE SEED LOCALIZATION Left 07/25/2017   Procedure: LEFT BREAST LUMPECTOMY WITH RADIOACTIVE SEED LOCALIZATION;  Surgeon: Vernetta Berg, MD;  Location: [facility] OR;  Service: General;  Laterality: Left;   BREAST SURGERY     Reduction   cataracts Bilateral 2012   COLONOSCOPY  2021   COLPOSCOPY     DILATATION & CURETTAGE/HYSTEROSCOPY WITH MYOSURE N/A 12/02/2020   Procedure: DILATATION & CURETTAGE/HYSTEROSCOPY WITH MYOSURE;  Surgeon: Lavoie, Marie-Lyne, MD;  Location:  SURGERY CENTER;  Service:  Gynecology;  Laterality: N/A;   DILATION AND CURETTAGE OF UTERUS  1975   DUB   EYE SURGERY     Laser for vision at times both eyes done   GASTRIC ROUX-EN-Y N/A 08/12/2017   Procedure: LAPAROSCOPIC ROUX-EN-Y GASTRIC BYPASS WITH HIATAL HERNIA REPAIR AND UPPER ENDOSCOPY;  Surgeon: [name], Herlene Righter, MD;  Location: [facility] ORS;  Service: General;  Laterality: N/A;   GYNECOLOGIC CRYOSURGERY     LEFT HEART CATH AND CORONARY ANGIOGRAPHY N/A 04/01/2024   Procedure: LEFT HEART CATH AND CORONARY ANGIOGRAPHY;  Surgeon: Jordan, Peter M, MD;  Location: [facility] INVASIVE CV LAB;  Service: Cardiovascular;  Laterality: N/A;   MYOMECTOMY     OVARIAN CYST REMOVAL     PANNICULECTOMY N/A 02/03/2024   Procedure: PANNICULECTOMY;  Surgeon: Waddell Leonce NOVAK, MD;  Location: [facility];  Service: Plastics;  Laterality: N/A;   PELVIC LAPAROSCOPY     DL lysis of adhesions 8024 and 1988   Patient Active Problem List   Diagnosis Date Noted   Encounter for breast and pelvic examination 04/13/2024   Osteopenia after menopause 04/13/2024   Leg ulcer (HCC) 03/16/2024   CAD (coronary artery disease) 03/15/2024   Elevated troponin 03/03/2024   Peripheral vascular disease 02/18/2023   Bilateral leg edema  07/21/2021   Dermatitis of eyelid, contact or allergic 07/31/2018   Left thalamic infarction (HCC) 07/12/2018   Carotid artery disease 07/09/2018   Right foot drop 12/17/2017   Atherosclerosis of aorta 10/09/2017   Bariatric surgery status, 07/2017 08/19/2017   Constipation 08/19/2017   Greater trochanteric bursitis of both hips 12/19/2016   OSA (obstructive sleep apnea) 07/26/2016   Diabetes mellitus with circulatory complication, without long-term current use of insulin  (HCC) 07/19/2015   History of colonic polyps 12/01/2014   Goiter 12/10/2011   Asthma 12/10/2011   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   History of TIA (transient ischemic attack) 02/13/2011   Hyperlipidemia associated with type 2  diabetes mellitus (HCC) 12/01/2006   Hypertension associated with type 2 diabetes mellitus (HCC) 07/07/2006    PCP: Geofm Glade PARAS, MD  REFERRING PROVIDER: Dallie Vera GAILS, MD  REFERRING DIAG: N32.81 (ICD-10-CM) - OAB (overactive bladder)  THERAPY DIAG:  No diagnosis found.   Rationale for Evaluation and Treatment: Rehabilitation   ONSET DATE: ***   SUBJECTIVE:                                                                                                                                                                                            SUBJECTIVE STATEMENT: Anxiety, depression, fibromyalgia, GERD, bil total knee, Rt total hip     PAIN:  Are you having pain? {yes/no:20286} NPRS scale: ***/10 Pain location: {pelvic pain location:27098}   Pain type: {type:313116} Pain description: {PAIN DESCRIPTION:21022940}    Aggravating factors: *** Relieving factors: ***   PRECAUTIONS: None   RED FLAGS: None       WEIGHT BEARING RESTRICTIONS: No   FALLS:  Has patient fallen in last 6 months? No   OCCUPATION: ***   ACTIVITY LEVEL : ***   PLOF: Independent   PATIENT GOALS: ***   PERTINENT HISTORY:  ***   BOWEL MOVEMENT: Pain with bowel movement: {yes/no:20286} Type of bowel movement:{PT BM type:27100} Fully empty rectum: {No/Yes:304960894} Leakage: {Yes/No:304960894} Urgency: {Yes/No:304960894} Pads: {Yes/No:304960894} Fiber supplement/laxative {YES/NO AS:20300}   URINATION: Pain with urination: {yes/no:20286} Fully empty bladder: {Yes/No:304960894} Stream: {PT urination:27102} Urgency: {YES/NO AS:20300} Frequency: *** Nocturia: *** Fluid Intake: *** Leakage: {PT leakage:27103} Pads: {Yes/No:304960894}   INTERCOURSE:             Ability to have vaginal penetration {YES/NO:21197} Pain with intercourse: {pain with intercourse PA:27099} Dryness{YES/NO AS:20300} Climax: *** Marinoff Scale: ***/3 Lubricant: ***   PREGNANCY: Vaginal deliveries  *** Tearing {Yes***/No:304960894} Episiotomy {YES/NO AS:20300} C-section deliveries *** Currently pregnant {Yes***/No:304960894}   PROLAPSE: {PT prolapse:27101}     OBJECTIVE:  Note: Objective measures were completed  at Evaluation unless otherwise noted.   04/23/24 PATIENT SURVEYS:    PFIQ-7: ***   COGNITION: Overall cognitive status: Within functional limits for tasks assessed                          SENSATION: Light touch: Appears intact     FUNCTIONAL TESTS:  Squat: *** Single leg stance:             Rt: ***             Lt: *** Curl-up test: *** Sit-up test: *** Active straight leg raise: ***     GAIT: Assistive device utilized: None Comments: WNL   POSTURE: {posture:25561}     LUMBARAROM/PROM:   A/PROM A/PROM  Eval (% available)  Flexion    Extension    Right lateral flexion    Left lateral flexion    Right rotation    Left rotation     (Blank rows = not tested)   PALPATION: General: ***   Abdominal: Sternocostal angle: *** Breathing: *** Tenderness: *** Scar tissue: *** Diastasis: ***                 External Perineal Exam: ***                             Internal Pelvic Floor: ***   Patient confirms identification and approves PT to assess internal pelvic floor and treatment {yes/no:20286}   PELVIC MMT:   MMT eval  Vaginal    Internal Anal Sphincter    External Anal Sphincter    Puborectalis    (Blank rows = not tested)         TONE: ***   PROLAPSE: ***   TODAY'S TREATMENT:                                                                                                                              DATE:   EVAL  Manual:   Neuromuscular re-education:   Exercises:   Therapeutic activities:         PATIENT EDUCATION:  Education details: See above Person educated: Patient Education method: Explanation, Demonstration, Tactile cues, Verbal cues, and Handouts Education comprehension: verbalized understanding    HOME EXERCISE PROGRAM: ***   ASSESSMENT:   CLINICAL IMPRESSION: Patient is a ***  who was seen today for physical therapy evaluation and treatment for ***.    OBJECTIVE IMPAIRMENTS: decreased activity tolerance, decreased coordination, decreased endurance, decreased mobility, decreased ROM, decreased strength, increased fascial restrictions, increased muscle spasms, impaired flexibility, impaired tone, improper body mechanics, postural dysfunction, and pain.    ACTIVITY LIMITATIONS: {activitylimitations:27494}   PARTICIPATION LIMITATIONS: {participationrestrictions:25113}   PERSONAL FACTORS: {Personal factors:25162} are also affecting patient's functional outcome.    REHAB POTENTIAL: Good   CLINICAL DECISION MAKING: Stable/uncomplicated   EVALUATION COMPLEXITY: Low     GOALS: Goals reviewed  with patient? Yes   SHORT TERM GOALS: Target date:      Pt will be independent with HEP in order to improve activity tolerance.    Baseline: Goal status: INITIAL   2.  ***   Baseline:  Goal status: INITIAL   3.  ***   Baseline:  Goal status: INITIAL   4.  *** Baseline:  Goal status: INITIAL   5.  *** Baseline:  Goal status: INITIAL   6.  *** Baseline:  Goal status: INITIAL   LONG TERM GOALS: Target date: 10/08/24   Pt will be independent with advanced HEP in order to improve activity tolerance.    Baseline:  Goal status: INITIAL   2.  *** Baseline:  Goal status: INITIAL   3.  *** Baseline:  Goal status: INITIAL   4.  *** Baseline:  Goal status: INITIAL   5.  *** Baseline:  Goal status: INITIAL   6.  *** Baseline:  Goal status: INITIAL   PLAN:   PT FREQUENCY: 1-2x/week   PT DURATION: ***    PLANNED INTERVENTIONS: 97164- PT Re-evaluation, 97110-Therapeutic exercises, 97530- Therapeutic activity, 97112- Neuromuscular re-education, 97535- Self Care, 02859- Manual therapy, [id number]- Gait training, [phone]- Aquatic Therapy, H9716- Electrical  stimulation (unattended), [id number]- Traction (mechanical), [id number]- Ionotophoresis 4mg /ml Dexamethasone , 20560 (1-2 muscles), 20561 (3+ muscles)- Dry Needling, Patient/Family education, Balance training, Taping, Joint mobilization, Joint manipulation, Spinal manipulation, Spinal mobilization, Scar mobilization, Vestibular training, Cryotherapy, Moist heat, and Biofeedback   PLAN FOR NEXT SESSION: ***   Britiany Silbernagel, PT 05/21/2024, 4:42 PM [facility] [address], [address] [place], [place] [address] Phone # [phone] Fax [phone] "

## 2024-05-22 ENCOUNTER — Ambulatory Visit: Admitting: Physical Therapy

## 2024-05-31 ENCOUNTER — Encounter: Payer: Self-pay | Admitting: Internal Medicine

## 2024-05-31 DIAGNOSIS — E1142 Type 2 diabetes mellitus with diabetic polyneuropathy: Secondary | ICD-10-CM | POA: Insufficient documentation

## 2024-05-31 NOTE — Patient Instructions (Addendum)
 "     Blood work was ordered.       Medications changes include :   None    A referral was ordered and someone will call you to schedule an appointment.     Return in about 6 months (around 11/29/2024) for follow up.    Health Maintenance, Female Adopting a healthy lifestyle and getting preventive care are important in promoting health and wellness. Ask your health care provider about: The right schedule for you to have regular tests and exams. Things you can do on your own to prevent diseases and keep yourself healthy. What should I know about diet, weight, and exercise? Eat a healthy diet  Eat a diet that includes plenty of vegetables, fruits, low-fat dairy products, and lean protein. Do not eat a lot of foods that are high in solid fats, added sugars, or sodium. Maintain a healthy weight Body mass index (BMI) is used to identify weight problems. It estimates body fat based on height and weight. Your health care provider can help determine your BMI and help you achieve or maintain a healthy weight. Get regular exercise Get regular exercise. This is one of the most important things you can do for your health. Most adults should: Exercise for at least 150 minutes each week. The exercise should increase your heart rate and make you sweat (moderate-intensity exercise). Do strengthening exercises at least twice a week. This is in addition to the moderate-intensity exercise. Spend less time sitting. Even light physical activity can be beneficial. Watch cholesterol and blood lipids Have your blood tested for lipids and cholesterol at 68 years of age, then have this test every 5 years. Have your cholesterol levels checked more often if: Your lipid or cholesterol levels are high. You are older than 68 years of age. You are at high risk for heart disease. What should I know about cancer screening? Depending on your health history and family history, you may need to have cancer  screening at various ages. This may include screening for: Breast cancer. Cervical cancer. Colorectal cancer. Skin cancer. Lung cancer. What should I know about heart disease, diabetes, and high blood pressure? Blood pressure and heart disease High blood pressure causes heart disease and increases the risk of stroke. This is more likely to develop in people who have high blood pressure readings or are overweight. Have your blood pressure checked: Every 3-5 years if you are 73-104 years of age. Every year if you are 66 years old or older. Diabetes Have regular diabetes screenings. This checks your fasting blood sugar level. Have the screening done: Once every three years after age 40 if you are at a normal weight and have a low risk for diabetes. More often and at a younger age if you are overweight or have a high risk for diabetes. What should I know about preventing infection? Hepatitis B If you have a higher risk for hepatitis B, you should be screened for this virus. Talk with your health care provider to find out if you are at risk for hepatitis B infection. Hepatitis C Testing is recommended for: Everyone born from [birth year] through 1964. Anyone with known risk factors for hepatitis C. Sexually transmitted infections (STIs) Get screened for STIs, including gonorrhea and chlamydia, if: You are sexually active and are younger than 68 years of age. You are older than 68 years of age and your health care provider tells you that you are at risk for this type of infection. Your sexual  activity has changed since you were last screened, and you are at increased risk for chlamydia or gonorrhea. Ask your health care provider if you are at risk. Ask your health care provider about whether you are at high risk for HIV. Your health care provider may recommend a prescription medicine to help prevent HIV infection. If you choose to take medicine to prevent HIV, you should first get tested for HIV. You  should then be tested every 3 months for as long as you are taking the medicine. Pregnancy If you are about to stop having your period (premenopausal) and you may become pregnant, seek counseling before you get pregnant. Take 400 to 800 micrograms (mcg) of folic acid every day if you become pregnant. Ask for birth control (contraception) if you want to prevent pregnancy. Osteoporosis and menopause Osteoporosis is a disease in which the bones lose minerals and strength with aging. This can result in bone fractures. If you are 7 years old or older, or if you are at risk for osteoporosis and fractures, ask your health care provider if you should: Be screened for bone loss. Take a calcium  or vitamin D  supplement to lower your risk of fractures. Be given hormone replacement therapy (HRT) to treat symptoms of menopause. Follow these instructions at home: Alcohol use Do not drink alcohol if: Your health care provider tells you not to drink. You are pregnant, may be pregnant, or are planning to become pregnant. If you drink alcohol: Limit how much you have to: 0-1 drink a day. Know how much alcohol is in your drink. In the [place] one drink equals one 12 oz bottle of beer (355 mL), one 5 oz glass of wine (148 mL), or one 1 oz glass of hard liquor (44 mL). Lifestyle Do not use any products that contain nicotine or tobacco. These products include cigarettes, chewing tobacco, and vaping devices, such as e-cigarettes. If you need help quitting, ask your health care provider. Do not use street drugs. Do not share needles. Ask your health care provider for help if you need support or information about quitting drugs. General instructions Schedule regular health, dental, and eye exams. Stay current with your vaccines. Tell your health care provider if: You often feel depressed. You have ever been abused or do not feel safe at home. Summary Adopting a healthy lifestyle and getting preventive care are  important in promoting health and wellness. Follow your health care provider's instructions about healthy diet, exercising, and getting tested or screened for diseases. Follow your health care provider's instructions on monitoring your cholesterol and blood pressure. This information is not intended to replace advice given to you by your health care provider. Make sure you discuss any questions you have with your health care provider. Document Revised: 09/26/2020 Document Reviewed: 09/26/2020 Elsevier Patient Education  2024 [employer]. "

## 2024-05-31 NOTE — Progress Notes (Unsigned)
 "   Subjective:    Patient ID: Rebekah Kennedy, female    DOB: Sep 29, 1955, 69 y.o.   MRN: [id number]      HPI Rebekah Kennedy is here for a Physical exam and her chronic medical problems.   Son just in serious mva with multiple injuries but was still in the hospital   Weighting herself daily and taking torsemide  10 mg - 20 mg daily as needed.     Medications and allergies reviewed with patient and updated if appropriate.  Medications Ordered Prior to Encounter[1]  Review of Systems  Constitutional:  Negative for fever.  Eyes:  Positive for visual disturbance (with macular edema - up to date with eye exams).  Respiratory:  Negative for cough, shortness of breath and wheezing.   Cardiovascular:  Positive for leg swelling (chronic, varies). Negative for chest pain and palpitations.  Gastrointestinal:  Negative for abdominal pain, blood in stool, constipation and diarrhea.       No gerd  Genitourinary:  Negative for dysuria.  Musculoskeletal:  Negative for arthralgias and back pain.  Skin:  Negative for rash.  Neurological:  Positive for light-headedness (sometimes if takes too much valsartan ). Negative for dizziness and headaches.  Psychiatric/Behavioral:  Negative for dysphoric mood. The patient is not nervous/anxious.        Objective:   Vitals:   06/01/24 0802 06/01/24 0846  BP: (!) 146/70 138/78  Pulse: 86   Temp: 98 F (36.7 C)   SpO2: 99%    Filed Weights   06/01/24 0802  Weight: 132 lb (59.9 kg)   Body mass index is 22.66 kg/m.  BP Readings from Last 3 Encounters:  06/01/24 138/78  04/27/24 (!) 160/70  04/13/24 (!) 158/64    Wt Readings from Last 3 Encounters:  06/01/24 132 lb (59.9 kg)  04/27/24 136 lb 6.4 oz (61.9 kg)  04/13/24 137 lb (62.1 kg)       Physical Exam Constitutional: She appears well-developed and well-nourished. No distress.  HENT:  Head: Normocephalic and atraumatic.  Right Ear: External ear normal. Normal ear canal and TM Left Ear:  External ear normal.  Normal ear canal and TM Mouth/Throat: Oropharynx is clear and moist.  Eyes: Conjunctivae normal.  Neck: Neck supple. No tracheal deviation present. No thyromegaly present.  No carotid bruit  Cardiovascular: Normal rate, regular rhythm and normal heart sounds.   No murmur heard.  No edema. Pulmonary/Chest: Effort normal and breath sounds normal. No respiratory distress. She has no wheezes. She has no rales.  Breast: deferred   Abdominal: Soft. She exhibits no distension. There is no tenderness.  Lymphadenopathy: She has no cervical adenopathy.  Skin: Skin is warm and dry. She is not diaphoretic.  Psychiatric: She has a normal mood and affect. Her behavior is normal.     Lab Results  Component Value Date   WBC 9.2 03/25/2024   HGB 12.8 03/25/2024   HCT 41.0 03/25/2024   PLT 323 03/25/2024   GLUCOSE 152 (H) 03/25/2024   CHOL 195 03/25/2024   TRIG 93 03/25/2024   HDL 110 03/25/2024   LDLDIRECT 126.7 03/24/2013   LDLCALC 69 03/25/2024   ALT 26 03/25/2024   AST 18 03/25/2024   NA 141 03/25/2024   K 3.9 03/25/2024   CL 101 03/25/2024   CREATININE 0.83 03/25/2024   BUN 21 03/25/2024   CO2 27 03/25/2024   TSH 1.32 03/02/2024   INR 0.96 07/07/2018   HGBA1C 6.0 03/02/2024   MICROALBUR 75.6 12/09/2023  Assessment & Plan:   Physical exam: Screening blood work  ordered Exercise  none right now - [facility] Weight  is normal Substance abuse  none   Reviewed recommended immunizations.   Health Maintenance  Topic Date Due   Bone Density Scan  10/19/2023   COVID-19 Vaccine (4 - 2025-26 season) 06/17/2024 (Originally 01/20/2024)   Influenza Vaccine  08/18/2024 (Originally 12/20/2023)   HEMOGLOBIN A1C  08/31/2024   OPHTHALMOLOGY EXAM  09/11/2024   FOOT EXAM  09/26/2024   Diabetic kidney evaluation - Urine ACR  01/21/2025   Medicare Annual Wellness (AWV)  01/21/2025   Diabetic kidney evaluation - eGFR measurement   04/23/2025   DTaP/Tdap/Td (2 - Td or Tdap) 10/05/2025   Mammogram  11/04/2025   Colonoscopy  01/22/2026   Pneumococcal Vaccine: 50+ Years  Completed   Hepatitis C Screening  Completed   Meningococcal B Vaccine  Aged Out   Zoster Vaccines- Shingrix  Discontinued      Will get dexa by gyn    See Problem List for Assessment and Plan of chronic medical problems.        [1]  Current Outpatient Medications on File Prior to Visit  Medication Sig Dispense Refill   albuterol  (PROVENTIL  HFA;VENTOLIN  HFA) 108 (90 Base) MCG/ACT inhaler Inhale 2 puffs into the lungs every 4 (four) hours as needed for wheezing or shortness of breath. 18 g 2   aspirin  EC 81 MG tablet Take 1 tablet (81 mg total) by mouth daily. 90 tablet 1   Biotin  5000 MCG TABS Take 10,000 mcg by mouth daily.      Cholecalciferol (VITAMIN D3 PO) Take 2,000 Units by mouth daily.     clopidogrel  (PLAVIX ) 75 MG tablet TAKE 1 TABLET BY MOUTH EVERY DAY 90 tablet 1   Continuous Glucose Receiver (FREESTYLE LIBRE 3 READER) DEVI Use to monitor glucose continuously. 1 each 0   Continuous Glucose Sensor (FREESTYLE LIBRE 3 PLUS SENSOR) MISC 1 each by Does not apply route every 14 (fourteen) days. 2 each 11   dapagliflozin  propanediol (FARXIGA ) 10 MG TABS tablet Take 1 tablet (10 mg total) by mouth daily before breakfast. 90 tablet 3   isosorbide  mononitrate (IMDUR ) 30 MG 24 hr tablet Take 1 tablet (30 mg total) by mouth daily. 90 tablet 3   montelukast  (SINGULAIR ) 10 MG tablet TAKE 1 TABLET BY MOUTH EVERY DAY 90 tablet 1   nystatin -triamcinolone  ointment (MYCOLOG) Apply topically as needed. 30 g 4   rosuvastatin  (CRESTOR ) 40 MG tablet Take 1 tablet (40 mg total) by mouth daily. 90 tablet 3   Semaglutide , 1 MG/DOSE, (OZEMPIC , 1 MG/DOSE,) 4 MG/3ML SOPN Inject 1 mg into the skin once a week. 9 mL 3   spironolactone  (ALDACTONE ) 25 MG tablet Take 1 tablet (25 mg total) by mouth daily. 90 tablet 3   tacrolimus  (PROTOPIC ) 0.1 % ointment Apply  topically 2 (two) times daily. 30 g 2   vitamin B-12 (CYANOCOBALAMIN ) 100 MCG tablet Take 100 mcg by mouth daily.     No current facility-administered medications on file prior to visit.   "

## 2024-06-01 ENCOUNTER — Ambulatory Visit: Payer: Self-pay | Admitting: Internal Medicine

## 2024-06-01 ENCOUNTER — Ambulatory Visit: Admitting: Internal Medicine

## 2024-06-01 VITALS — BP 138/78 | HR 86 | Temp 98.0°F | Ht 64.0 in | Wt 132.0 lb

## 2024-06-01 DIAGNOSIS — G4733 Obstructive sleep apnea (adult) (pediatric): Secondary | ICD-10-CM

## 2024-06-01 DIAGNOSIS — I2510 Atherosclerotic heart disease of native coronary artery without angina pectoris: Secondary | ICD-10-CM | POA: Diagnosis not present

## 2024-06-01 DIAGNOSIS — E1151 Type 2 diabetes mellitus with diabetic peripheral angiopathy without gangrene: Secondary | ICD-10-CM | POA: Diagnosis not present

## 2024-06-01 DIAGNOSIS — E1142 Type 2 diabetes mellitus with diabetic polyneuropathy: Secondary | ICD-10-CM

## 2024-06-01 DIAGNOSIS — I739 Peripheral vascular disease, unspecified: Secondary | ICD-10-CM

## 2024-06-01 DIAGNOSIS — E1169 Type 2 diabetes mellitus with other specified complication: Secondary | ICD-10-CM | POA: Diagnosis not present

## 2024-06-01 DIAGNOSIS — J4520 Mild intermittent asthma, uncomplicated: Secondary | ICD-10-CM

## 2024-06-01 DIAGNOSIS — E785 Hyperlipidemia, unspecified: Secondary | ICD-10-CM

## 2024-06-01 DIAGNOSIS — I10 Essential (primary) hypertension: Secondary | ICD-10-CM

## 2024-06-01 DIAGNOSIS — M8580 Other specified disorders of bone density and structure, unspecified site: Secondary | ICD-10-CM

## 2024-06-01 DIAGNOSIS — Z780 Asymptomatic menopausal state: Secondary | ICD-10-CM

## 2024-06-01 DIAGNOSIS — Z0000 Encounter for general adult medical examination without abnormal findings: Secondary | ICD-10-CM | POA: Diagnosis not present

## 2024-06-01 DIAGNOSIS — R600 Localized edema: Secondary | ICD-10-CM

## 2024-06-01 DIAGNOSIS — E1159 Type 2 diabetes mellitus with other circulatory complications: Secondary | ICD-10-CM

## 2024-06-01 DIAGNOSIS — I152 Hypertension secondary to endocrine disorders: Secondary | ICD-10-CM | POA: Diagnosis not present

## 2024-06-01 DIAGNOSIS — I6930 Unspecified sequelae of cerebral infarction: Secondary | ICD-10-CM

## 2024-06-01 LAB — LIPID PANEL
Cholesterol: 161 mg/dL (ref 28–200)
HDL: 82.7 mg/dL
LDL Cholesterol: 63 mg/dL (ref 10–99)
NonHDL: 78.56
Total CHOL/HDL Ratio: 2
Triglycerides: 77 mg/dL (ref 10.0–149.0)
VLDL: 15.4 mg/dL (ref 0.0–40.0)

## 2024-06-01 LAB — VITAMIN D 25 HYDROXY (VIT D DEFICIENCY, FRACTURES): VITD: 38.86 ng/mL (ref 30.00–100.00)

## 2024-06-01 LAB — COMPREHENSIVE METABOLIC PANEL WITH GFR
ALT: 14 U/L (ref 3–35)
AST: 14 U/L (ref 5–37)
Albumin: 3.8 g/dL (ref 3.5–5.2)
Alkaline Phosphatase: 67 U/L (ref 39–117)
BUN: 14 mg/dL (ref 6–23)
CO2: 27 meq/L (ref 19–32)
Calcium: 9.6 mg/dL (ref 8.4–10.5)
Chloride: 109 meq/L (ref 96–112)
Creatinine, Ser: 0.65 mg/dL (ref 0.40–1.20)
GFR: 90.86 mL/min
Glucose, Bld: 119 mg/dL — ABNORMAL HIGH (ref 70–99)
Potassium: 4.5 meq/L (ref 3.5–5.1)
Sodium: 142 meq/L (ref 135–145)
Total Bilirubin: 0.6 mg/dL (ref 0.2–1.2)
Total Protein: 6.9 g/dL (ref 6.0–8.3)

## 2024-06-01 LAB — CBC
HCT: 35.2 % — ABNORMAL LOW (ref 36.0–46.0)
Hemoglobin: 11.8 g/dL — ABNORMAL LOW (ref 12.0–15.0)
MCHC: 33.4 g/dL (ref 30.0–36.0)
MCV: 82.3 fl (ref 78.0–100.0)
Platelets: 301 K/uL (ref 150.0–400.0)
RBC: 4.28 Mil/uL (ref 3.87–5.11)
RDW: 14.2 % (ref 11.5–15.5)
WBC: 6.4 K/uL (ref 4.0–10.5)

## 2024-06-01 MED ORDER — TORSEMIDE 20 MG PO TABS: 10.0000 mg | ORAL_TABLET | Freq: Every day | ORAL | 5 refills | Status: AC | PRN

## 2024-06-01 MED ORDER — VALSARTAN 160 MG PO TABS: 160.0000 mg | ORAL_TABLET | Freq: Every day | ORAL | 3 refills | Status: AC

## 2024-06-01 MED ORDER — HYDRALAZINE HCL 50 MG PO TABS: 50.0000 mg | ORAL_TABLET | Freq: Three times a day (TID) | ORAL | 1 refills | Status: AC

## 2024-06-01 MED ORDER — METOPROLOL SUCCINATE ER 100 MG PO TB24: ORAL_TABLET | ORAL | 3 refills | Status: AC

## 2024-06-01 NOTE — Assessment & Plan Note (Addendum)
 Chronic Blood pressure not ideally controlled, but with increased medication she tends to feel lightheaded and is at risk of falls No change in medication Continue to work on low sodium diet, increase exercise Currently taking valsartan  160 mg daily, torsemide  20 mg a few days a week, spironolactone  25 mg daily, metoprolol  XL 100 mg daily, Imdur  30 mg daily and hydralazine  50 mg 3 times a day CMP, CBC

## 2024-06-01 NOTE — Assessment & Plan Note (Signed)
 Chronic Lab Results  Component Value Date   LDLCALC 69 03/25/2024   Goal LDL less than 55 for cardiology Continue Crestor  40 mg daily-increased by cardiology Check lipid panel, CMP

## 2024-06-01 NOTE — Assessment & Plan Note (Signed)
 Chronic Diabetes well-controlled Not on medication-would not likely help

## 2024-06-01 NOTE — Assessment & Plan Note (Addendum)
 Chronic Following with cardiology Severe CAD in multiple vessels, medical therapy advised Asymptomatic Continue aspirin  81 mg daily, Plavix  75 mg daily, Farxiga  10 mg daily, spironolactone  25 mg daily, valsartan  160 mg daily

## 2024-06-01 NOTE — Assessment & Plan Note (Signed)
 Chronic ?Using CPAP nightly ?

## 2024-06-01 NOTE — Assessment & Plan Note (Signed)
 Chronic Mild, intermittent Controlled Continue Singulair 10 mg nightly and albuterol inhaler as needed

## 2024-06-01 NOTE — Assessment & Plan Note (Signed)
 Chronic Associated with PVD, CAD, hypertension, hyperlipidemia Management per Dr  Trixie Lab Results  Component Value Date   HGBA1C 6.0 03/02/2024   Sugars controlled Stressed compliance with diabetic diet Stressed regular exercise Continue Farxiga  10 mg daily, Ozempic  1 mg weekly

## 2024-06-01 NOTE — Assessment & Plan Note (Addendum)
 Chronic DEXA due - managed by gyn Encouraged her to be as active as possible Continue vitamin D  supplementation Discussed goal of approximately 1200 mg of calcium  daily between food and supplementation Check vitamin D  level

## 2024-06-01 NOTE — Assessment & Plan Note (Signed)
 Chronic Currently on aspirin  81 mg daily, Plavix  75 mg daily, Crestor  40 mg daily

## 2024-06-01 NOTE — Assessment & Plan Note (Addendum)
 Chronic Has some trace edema Weighing herself daily Stressed low-sodium diet, elevating legs when sitting Taking torsemide  10 mg daily as needed for weight gain of 3 lbs in one day or 5lbs in one week

## 2024-06-01 NOTE — Assessment & Plan Note (Addendum)
 History of small left thalamic infarct Currently taking aspirin  81 mg daily and Plavix , Crestor  40 mg daily Continue Crestor  Chronic right sided ataxia, weakness

## 2024-06-03 ENCOUNTER — Ambulatory Visit (INDEPENDENT_AMBULATORY_CARE_PROVIDER_SITE_OTHER): Payer: Medicare Other | Admitting: Neurology

## 2024-06-03 VITALS — BP 162/92 | HR 104 | Ht 64.0 in | Wt 132.0 lb

## 2024-06-03 DIAGNOSIS — R2681 Unsteadiness on feet: Secondary | ICD-10-CM | POA: Diagnosis not present

## 2024-06-03 DIAGNOSIS — E1142 Type 2 diabetes mellitus with diabetic polyneuropathy: Secondary | ICD-10-CM

## 2024-06-03 NOTE — Patient Instructions (Signed)
 Referral placed to start physical therapy  Recommend installing hand rails in the bathroom  Recommend using walker
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
## 2024-06-09 NOTE — Progress Notes (Unsigned)
 Patient ID: Rebekah Kennedy, female   DOB: 12-08-1955, 69 y.o.   MRN: [id number]  HPI: Rebekah Kennedy is a 69 y.o.-year-old female, returning for follow-up for DM2, dx in 2004, non-insulin -dependent (on insulin  until 2019), uncontrolled, with complications (CVA, PDR, CKD, PN, severe hypoglycemia in 2018). Pt. previously saw Dr. Kassie. Last OV with me 6 months ago.  Interim history: She continues a whole food plant based diet  - based on Dr. Tennie book. No nausea, chest pain, but has increased urination - on Torsemide . She lost 18 lbs since last OV. Her son had an MVA at the end of last month >> was admitted for 3 weeks, just discharged yesterday.  She was under a lot of stress and did not check her blood sugars.  Reviewed HbA1c: Lab Results  Component Value Date   HGBA1C 6.0 03/02/2024   HGBA1C 6.1 (A) 12/09/2023   HGBA1C 6.2 (A) 09/27/2023   HGBA1C 7.0 (H) 05/29/2023   HGBA1C 6.6 (A) 03/29/2023   HGBA1C 9.1 (A) 11/13/2022   HGBA1C 7.4 (A) 06/27/2022   HGBA1C 7.6 (A) 02/20/2022   HGBA1C 8.7 (A) 10/13/2021   HGBA1C 8.8 (H) 08/10/2021   Pt is on a regimen of: - Januvia  >> Rybelsus  14 mg before breakfast >> Ozempic  0.5 >> 1 mg weekly  - Farxiga  10 >> 5 >> 10 mg before breakfast Glyburide  every day - started by Dr. Geofm >> stopped 06/2022 due to CBG 50. Metformin caused diarrhea. She was previously on insulin  between 2013 and 2019 before her gastric bypass.  She checks her blood sugars 0-1x a day: - am: <130 >> 120s - 2h after b'fast: ? - lunch: ? - 2h after lunch: <200 >> 160-180s - dinner: ? - 2h after dinner: usually <200 >> 160-180s - bedtime: ?  Prev.:  Previously:   Previously:  Lowest sugar was 56 >> 88 >> 70s; she has hypoglycemia awareness at 70.  Highest sugar was 390s >> 401 >> 180s.  Glucometer:One Touch Verio  - + CKD - sees nephrology now, last BUN/creatinine:  Lab Results  Component Value Date   BUN 14 06/01/2024   BUN 21 03/25/2024    CREATININE 0.65 06/01/2024   CREATININE 0.83 03/25/2024   Lab Results  Component Value Date   MICRALBCREAT 889 01/22/2024   MICRALBCREAT 1,008 (H) 12/09/2023   MICRALBCREAT 272.3 (H) 09/15/2009   MICRALBCREAT 187.2 (H) 06/17/2007   MICRALBCREAT 34.7 (H) 10/11/2006  On losartan  160 mg daily (reduced 1 week ago).  -+ HL; last set of lipids: Lab Results  Component Value Date   CHOL 161 06/01/2024   HDL 82.70 06/01/2024   LDLCALC 63 06/01/2024   LDLDIRECT 126.7 03/24/2013   TRIG 77.0 06/01/2024   CHOLHDL 2 06/01/2024  On Crestor  20 mg daily.  - last eye exam was in  09/12/2023. + PDR. Finished IO injections for macular edema. No bleeding.  In the past few days - floaters >> cleared up now.  - no numbness and tingling in her feet.  Last foot exam 11/11/2023 by Dr. Zan.  We also had a foot exam in the office at last visit 09/27/2023.   On ASA 81.  She had gastric bypass in 2019.  She lost 80 pounds afterwards. She also has a history of HTN, osteopenia, depression, asthma, obstructive sleep apnea. She is a runner, broadcasting/film/video.  ROS: + see HPI  Past Medical History:  Diagnosis Date   Ambulates with cane 11/29/2020   outside of home  ASTHMA NOS W/ACUTE EXACERBATION 07/10/2010   Cervical dysplasia    DEPRESSION 03/16/2007   DIABETES MELLITUS, TYPE II 12/01/2006   dx 2004   Dysmenorrhea    Fibroid    History of blood product transfusion    took own blood that was banked in 1990's   HYPERLIPIDEMIA 12/01/2006   HYPERTENSION 07/07/2006   Macular edema    Neuropathy    fingertips and tose some numbness, right foot numb at times   Obesity    Osteopenia 05/2017   T score -1.5 FRAX 2.6% / 0.1%   Pneumonia    1990's and 2000 none since   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 issuse resolved   Retinal edema    gets steriod inj in the eyes     Sleep apnea    not needed since 100 pound weight loss 2019-2020   Splenomegaly    in college   Stroke  (cerebrum) (HCC) 06/2018   left side pt states, left wide weak per pt   Stroke [facility]) 2012   right pontine cva with left hemiparesis   Past Surgical History:  Procedure Laterality Date   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   APPENDECTOMY     yrs ago per pt 0n 11-29-2020   BIOPSY THYROID   05/02/2011   Nonneoplastic goiter   BREAST BIOPSY     BREAST LUMPECTOMY WITH RADIOACTIVE SEED LOCALIZATION Left 07/25/2017   Procedure: LEFT BREAST LUMPECTOMY WITH RADIOACTIVE SEED LOCALIZATION;  Surgeon: Vernetta Berg, MD;  Location: [facility] OR;  Service: General;  Laterality: Left;   BREAST SURGERY     Reduction   cataracts Bilateral 2012   COLONOSCOPY  2021   COLPOSCOPY     DILATATION & CURETTAGE/HYSTEROSCOPY WITH MYOSURE N/A 12/02/2020   Procedure: DILATATION & CURETTAGE/HYSTEROSCOPY WITH MYOSURE;  Surgeon: Lavoie, Marie-Lyne, MD;  Location: [facility];  Service: Gynecology;  Laterality: N/A;   DILATION AND CURETTAGE OF UTERUS  1975   DUB   EYE SURGERY     Laser for vision at times both eyes done   GASTRIC ROUX-EN-Y N/A 08/12/2017   Procedure: LAPAROSCOPIC ROUX-EN-Y GASTRIC BYPASS WITH HIATAL HERNIA REPAIR AND UPPER ENDOSCOPY;  Surgeon: [name], Herlene Righter, MD;  Location: [facility] ORS;  Service: General;  Laterality: N/A;   GYNECOLOGIC CRYOSURGERY     LEFT HEART CATH AND CORONARY ANGIOGRAPHY N/A 04/01/2024   Procedure: LEFT HEART CATH AND CORONARY ANGIOGRAPHY;  Surgeon: Jordan, Peter M, MD;  Location: [facility] INVASIVE CV LAB;  Service: Cardiovascular;  Laterality: N/A;   MYOMECTOMY     OVARIAN CYST REMOVAL     PANNICULECTOMY N/A 02/03/2024   Procedure: PANNICULECTOMY;  Surgeon: Waddell Leonce NOVAK, MD;  Location: [facility];  Service: Plastics;  Laterality: N/A;   PELVIC LAPAROSCOPY     DL lysis of adhesions 8024 and 1988   Social History   Socioeconomic History   Marital status: Single    Spouse name: Not on file   Number of children: 2   Years of education: 16   Highest  education level: Master's degree (e.g., MA, MS, MEng, MEd, MSW, MBA)  Occupational History   Occupation: TEACHER    Employer: [employer]	[place]  Tobacco Use   Smoking status: Never   Smokeless tobacco: Never  Vaping Use   Vaping status: Never Used  Substance and Sexual Activity   Alcohol  use: Yes    Alcohol /week: 1.0 standard drink of alcohol   Types: 1 Glasses of wine per week    Comment: occas   Drug use: Never   Sexual activity: Not Currently    Birth control/protection: Post-menopausal    Comment: older than 17, less than 5  Other Topics Concern   Not on file  Social History Narrative   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   Living with daughter      Right handed      4 steps inside of the home      Social Drivers of Health   Tobacco Use: Low Risk (05/31/2024)   Patient History    Smoking Tobacco Use: Never    Smokeless Tobacco Use: Never    Passive Exposure: Not on file  Financial Resource Strain: Low Risk (03/16/2024)   Overall Financial Resource Strain (CARDIA)    Difficulty of Paying Living Expenses: Not hard at all  Food Insecurity: No Food Insecurity (03/16/2024)   Epic    Worried About Radiation Protection Practitioner of Food in the Last Year: Never true    Ran Out of Food in the Last Year: Never true  Transportation Needs: No Transportation Needs (03/16/2024)   Epic    Lack of Transportation (Medical): No    Lack of Transportation (Non-Medical): No  Physical Activity: Insufficiently Active (03/16/2024)   Exercise Vital Sign    Days of Exercise per Week: 2 days    Minutes of Exercise per Session: 40 min  Stress: No Stress Concern Present (03/16/2024)   [employer] of Occupational Health - Occupational Stress Questionnaire    Feeling of Stress: Not at all  Social Connections: Moderately Integrated (03/16/2024)   Social Connection and Isolation Panel    Frequency of Communication with Friends and Family:  More than three times a week    Frequency of Social Gatherings with Friends and Family: More than three times a week    Attends Religious Services: More than 4 times per year    Active Member of [employer] or Organizations: Yes    Attends Engineer, Structural: More than 4 times per year    Marital Status: Never married  Recent Concern: Social Connections - Moderately Isolated (01/24/2024)   Social Connection and Isolation Panel    Frequency of Communication with Friends and Family: More than three times a week    Frequency of Social Gatherings with Friends and Family: Three times a week    Attends Religious Services: More than 4 times per year    Active Member of Clubs or Organizations: No    Attends Banker Meetings: Not on file    Marital Status: Never married  Intimate Partner Violence: Not At Risk (01/22/2024)   Epic    Fear of Current or Ex-Partner: No    Emotionally Abused: No    Physically Abused: No    Sexually Abused: No  Depression (PHQ2-9): Low Risk (06/01/2024)   Depression (PHQ2-9)    PHQ-2 Score: 0  Alcohol  Screen: Low Risk (03/16/2024)   Alcohol  Screen    Last Alcohol  Screening Score (AUDIT): 2  Housing: Low Risk (03/16/2024)   Epic    Unable to Pay for Housing in the Last Year: No    Number of Times Moved in the Last Year: 0    Homeless in the Last Year: No  Utilities: Not At Risk (01/22/2024)   Epic    Threatened with loss of utilities: No  Health Literacy: Adequate Health  Literacy (01/22/2024)   B1300 Health Literacy    Frequency of need for help with medical instructions: Never   Current Outpatient Medications on File Prior to Visit  Medication Sig Dispense Refill   albuterol  (PROVENTIL  HFA;VENTOLIN  HFA) 108 (90 Base) MCG/ACT inhaler Inhale 2 puffs into the lungs every 4 (four) hours as needed for wheezing or shortness of breath. 18 g 2   aspirin  EC 81 MG tablet Take 1 tablet (81 mg total) by mouth daily. 90 tablet 1   Biotin  5000 MCG TABS Take  10,000 mcg by mouth daily.      Cholecalciferol (VITAMIN D3 PO) Take 2,000 Units by mouth daily.     clopidogrel  (PLAVIX ) 75 MG tablet TAKE 1 TABLET BY MOUTH EVERY DAY 90 tablet 1   Continuous Glucose Receiver (FREESTYLE LIBRE 3 READER) DEVI Use to monitor glucose continuously. 1 each 0   Continuous Glucose Sensor (FREESTYLE LIBRE 3 PLUS SENSOR) MISC 1 each by Does not apply route every 14 (fourteen) days. 2 each 11   dapagliflozin  propanediol (FARXIGA ) 10 MG TABS tablet Take 1 tablet (10 mg total) by mouth daily before breakfast. 90 tablet 3   hydrALAZINE  (APRESOLINE ) 50 MG tablet Take 1 tablet (50 mg total) by mouth 3 (three) times daily. 270 tablet 1   isosorbide  mononitrate (IMDUR ) 30 MG 24 hr tablet Take 1 tablet (30 mg total) by mouth daily. 90 tablet 3   metoprolol  succinate (TOPROL -XL) 100 MG 24 hr tablet TAKE 1 TABLET BY MOUTH DAILY WITH OR IMMEDIATELY FOLLOWING A MEAL 90 tablet 3   montelukast  (SINGULAIR ) 10 MG tablet TAKE 1 TABLET BY MOUTH EVERY DAY 90 tablet 1   nystatin -triamcinolone  ointment (MYCOLOG) Apply topically as needed. 30 g 4   rosuvastatin  (CRESTOR ) 40 MG tablet Take 1 tablet (40 mg total) by mouth daily. 90 tablet 3   Semaglutide , 1 MG/DOSE, (OZEMPIC , 1 MG/DOSE,) 4 MG/3ML SOPN Inject 1 mg into the skin once a week. 9 mL 3   spironolactone  (ALDACTONE ) 25 MG tablet Take 1 tablet (25 mg total) by mouth daily. 90 tablet 3   tacrolimus  (PROTOPIC ) 0.1 % ointment Apply topically 2 (two) times daily. 30 g 2   torsemide  (DEMADEX ) 20 MG tablet Take 0.5-1 tablets (10-20 mg total) by mouth daily as needed. 60 tablet 5   valsartan  (DIOVAN ) 160 MG tablet Take 1 tablet (160 mg total) by mouth daily. 90 tablet 3   vitamin B-12 (CYANOCOBALAMIN ) 100 MCG tablet Take 100 mcg by mouth daily.     No current facility-administered medications on file prior to visit.   Allergies  Allergen Reactions   Quinapril Cough   Amlodipine  Other (See Comments)    edema   Metformin And Related Diarrhea     Loose stool   Family History  Problem Relation Age of Onset   Hypertension Mother    Heart disease Mother    Ovarian cancer Mother    Cancer Mother        Lung cancer   Asthma Mother    COPD Mother    Hyperlipidemia Mother    Diabetes Father    Heart disease Father    Hypertension Father    Hyperlipidemia Father    Stroke Father        passed at age 67 from stroke   Graves' disease Sister    Diabetes Sister    Breast cancer Sister 3   Stroke Brother    Heart failure Brother    Diabetes Brother  Hypertension Brother    Heart failure Brother    Prostate cancer Brother    Diabetes Brother    Asthma Brother    Hypertension Brother    Kidney disease Brother    Heart failure Brother    Prostate cancer Brother    Stroke Brother    Hypertension Brother    Cancer Maternal Grandmother        Colon Cancer   Asthma Maternal Grandmother    Colon cancer Maternal Grandmother    Cancer Maternal Grandfather    Graves' disease Paternal Grandmother    Hypertension Paternal Grandmother    Heart disease Paternal Grandmother    Alzheimer's disease Paternal Grandmother    PE: BP 130/60   Pulse 68   Ht 5' 4 (1.626 m)   Wt 134 lb (60.8 kg)   SpO2 99%   BMI 23.00 kg/m  Wt Readings from Last 20 Encounters:  06/10/24 134 lb (60.8 kg)  06/03/24 132 lb (59.9 kg)  06/01/24 132 lb (59.9 kg)  04/27/24 136 lb 6.4 oz (61.9 kg)  04/13/24 137 lb (62.1 kg)  04/01/24 132 lb (59.9 kg)  03/30/24 132 lb (59.9 kg)  03/23/24 132 lb (59.9 kg)  03/23/24 132 lb 11.2 oz (60.2 kg)  03/16/24 141 lb (64 kg)  03/02/24 145 lb 12.8 oz (66.1 kg)  02/26/24 145 lb (65.8 kg)  02/14/24 146 lb (66.2 kg)  02/10/24 147 lb 3.2 oz (66.8 kg)  02/03/24 155 lb 13.8 oz (70.7 kg)  01/24/24 150 lb (68 kg)  01/23/24 150 lb 12.8 oz (68.4 kg)  01/22/24 149 lb 12.8 oz (67.9 kg)  12/09/23 152 lb 3.2 oz (69 kg)  11/07/23 151 lb (68.5 kg)   Constitutional: overweight, in NAD Eyes: EOMI, no exophthalmos ENT:  no thyromegaly, no cervical lymphadenopathy Cardiovascular: RRR, No MRG, + B LE swelling Respiratory: CTA B Musculoskeletal: no deformities Skin: + B stasis dermatitis rash Neurological: no tremor with outstretched hands  1. DM2, non-insulin -dependent, uncontrolled, with  complications - CVA - 06/2018 - CKD - PDR - PN - severe hypoglycemia in 2018  Component     Latest Ref Rng 10/13/2021  Hemoglobin A1C     4.0 - 5.6 % 8.7 !   Glucose     65 - 99 mg/dL 879 (H)   Glucose Fasting, POC     70 - 99 mg/dL 857 !   Islet Cell Ab     Neg:<1:1  Negative   C-Peptide     0.80 - 3.85 ng/mL 1.92   ZNT8 Antibodies     <15 U/mL <10   Glutamic Acid Decarb Ab     <5 IU/mL <5     No insulin  deficiency or antipancreatic autoimmunity.  2. HL  PLAN:  1. Patient with longstanding, uncontrolled, type 2 diabetes, on oral antidiabetic regimen with SGLT2 inhibitor and also weekly GLP-1 receptor agonist, with improving control.  HbA1c at last visit was lower, at 6.1%, and since then, 3 months ago, she had another HbA1c which was even better, at 6.0%. - Her insurance did not cover the Dexcom sensor anymore and she switched to the freestyle libre CGM but this became incompatible with her phone after she upgraded her phone.  She was not checking blood sugars at last visit I sent a prescription for the [facility] receiver to her pharmacy and advised her to start using it. -She did not check sugars in the last 3 weeks due to increased stress with her son being in  the hospital, but she previously checked fairly consistently and the sugars were mostly at goal.  She did lose 18 pounds since last visit so at today's visit we discussed about reducing the Ozempic  dose.  Will continue the Farxiga  dose.  I also again recommended a CGM, which she tried, but it kept coming off.  We discussed about different options to keep it in place.  I sent a new prescription to her pharmacy. - I suggested to:  Patient Instructions   Please continue: - Farxiga  10 mg before breakfast  Try to decrease: - Ozempic  0.5 mg weekly  On the Ozempic  1 mg pen: - 18 clicks  - 0.25 mg - 36 clicks - 0.5 mg - 54 clicks - 0.75 mg - 72 clicks - 1 mg  Try to restart the [place] CGM - download the [employer].  Please return in 4 months.  - we checked her HbA1c: 6.1% (only slightly higher) - advised to check sugars at different times of the day - 4x a day, rotating check times - advised for yearly eye exams >> she is UTD -At last visit, her ACR was 1008.  This was increasing.  I suggested a referral to nephrology.  She was able to establish care with them.  She continues on losartan  and Farxiga . - return to clinic in 4 months  2. HL - Lipid panel was reviewed from a week ago: LDL much improved from baseline, but above our target of less than 55: Lab Results  Component Value Date   CHOL 161 06/01/2024   HDL 82.70 06/01/2024   LDLCALC 63 06/01/2024   LDLDIRECT 126.7 03/24/2013   TRIG 77.0 06/01/2024   CHOLHDL 2 06/01/2024  - She continues Crestor  20 mg daily without side effects  Lela Fendt, MD PhD [facility] Endocrinology

## 2024-06-10 ENCOUNTER — Encounter: Payer: Self-pay | Admitting: Internal Medicine

## 2024-06-10 ENCOUNTER — Ambulatory Visit: Admitting: Internal Medicine

## 2024-06-10 VITALS — BP 130/60 | HR 68 | Ht 64.0 in | Wt 134.0 lb

## 2024-06-10 DIAGNOSIS — Z7984 Long term (current) use of oral hypoglycemic drugs: Secondary | ICD-10-CM

## 2024-06-10 DIAGNOSIS — E7849 Other hyperlipidemia: Secondary | ICD-10-CM

## 2024-06-10 DIAGNOSIS — Z7985 Long-term (current) use of injectable non-insulin antidiabetic drugs: Secondary | ICD-10-CM

## 2024-06-10 DIAGNOSIS — E1165 Type 2 diabetes mellitus with hyperglycemia: Secondary | ICD-10-CM | POA: Diagnosis not present

## 2024-06-10 DIAGNOSIS — E1159 Type 2 diabetes mellitus with other circulatory complications: Secondary | ICD-10-CM

## 2024-06-10 LAB — POCT GLYCOSYLATED HEMOGLOBIN (HGB A1C): Hemoglobin A1C: 6.1 % — AB (ref 4.0–5.6)

## 2024-06-10 MED ORDER — FREESTYLE LIBRE 3 PLUS SENSOR MISC: 1.0000 | 3 refills | Status: AC

## 2024-06-10 MED ORDER — OZEMPIC (0.25 OR 0.5 MG/DOSE) 2 MG/1.5ML ~~LOC~~ SOPN: 0.5000 mg | PEN_INJECTOR | SUBCUTANEOUS | 3 refills | Status: AC

## 2024-06-10 NOTE — Patient Instructions (Addendum)
 Please continue: - Farxiga  10 mg before breakfast  Try to decrease: - Ozempic  0.5 mg weekly  On the Ozempic  1 mg pen: - 18 clicks  - 0.25 mg - 36 clicks - 0.5 mg - 54 clicks - 0.75 mg - 72 clicks - 1 mg  Try to restart the [place] CGM - download the [employer].  Please return in 4 months.

## 2024-06-24 LAB — OPHTHALMOLOGY REPORT-SCANNED

## 2024-07-03 ENCOUNTER — Ambulatory Visit: Admitting: Physical Therapy

## 2024-07-20 ENCOUNTER — Ambulatory Visit: Admitting: Physical Therapy

## 2024-10-08 ENCOUNTER — Ambulatory Visit: Admitting: Internal Medicine

## 2024-11-30 ENCOUNTER — Ambulatory Visit: Admitting: Internal Medicine

## 2025-01-26 ENCOUNTER — Ambulatory Visit

## 2025-01-27 ENCOUNTER — Encounter: Admitting: Internal Medicine

## 2025-06-07 ENCOUNTER — Ambulatory Visit: Admitting: Neurology
